# Patient Record
Sex: Male | Born: 1940 | Race: White | Hispanic: No | Marital: Married | Smoking: Former smoker
Health system: Southern US, Community
[De-identification: ages and names within clinical notes are randomized; demographics above are authoritative.]

## PROBLEM LIST (undated history)

## (undated) DIAGNOSIS — G20 Parkinson's disease: Secondary | ICD-10-CM

## (undated) DIAGNOSIS — F419 Anxiety disorder, unspecified: Secondary | ICD-10-CM

## (undated) DIAGNOSIS — I350 Nonrheumatic aortic (valve) stenosis: Secondary | ICD-10-CM

## (undated) DIAGNOSIS — F32A Depression, unspecified: Secondary | ICD-10-CM

## (undated) DIAGNOSIS — E1143 Type 2 diabetes mellitus with diabetic autonomic (poly)neuropathy: Secondary | ICD-10-CM

## (undated) DIAGNOSIS — K3184 Gastroparesis: Secondary | ICD-10-CM

## (undated) DIAGNOSIS — G20A1 Parkinson's disease without dyskinesia, without mention of fluctuations: Secondary | ICD-10-CM

## (undated) DIAGNOSIS — E785 Hyperlipidemia, unspecified: Secondary | ICD-10-CM

## (undated) DIAGNOSIS — I509 Heart failure, unspecified: Secondary | ICD-10-CM

## (undated) DIAGNOSIS — I739 Peripheral vascular disease, unspecified: Secondary | ICD-10-CM

## (undated) DIAGNOSIS — E119 Type 2 diabetes mellitus without complications: Secondary | ICD-10-CM

## (undated) DIAGNOSIS — M51369 Other intervertebral disc degeneration, lumbar region without mention of lumbar back pain or lower extremity pain: Secondary | ICD-10-CM

## (undated) DIAGNOSIS — M5136 Other intervertebral disc degeneration, lumbar region: Secondary | ICD-10-CM

## (undated) DIAGNOSIS — R9389 Abnormal findings on diagnostic imaging of other specified body structures: Secondary | ICD-10-CM

## (undated) DIAGNOSIS — J449 Chronic obstructive pulmonary disease, unspecified: Secondary | ICD-10-CM

## (undated) DIAGNOSIS — E669 Obesity, unspecified: Secondary | ICD-10-CM

## (undated) DIAGNOSIS — N529 Male erectile dysfunction, unspecified: Secondary | ICD-10-CM

## (undated) DIAGNOSIS — J90 Pleural effusion, not elsewhere classified: Secondary | ICD-10-CM

## (undated) DIAGNOSIS — C61 Malignant neoplasm of prostate: Secondary | ICD-10-CM

## (undated) DIAGNOSIS — I459 Conduction disorder, unspecified: Secondary | ICD-10-CM

## (undated) DIAGNOSIS — F329 Major depressive disorder, single episode, unspecified: Secondary | ICD-10-CM

## (undated) DIAGNOSIS — T4145XA Adverse effect of unspecified anesthetic, initial encounter: Secondary | ICD-10-CM

## (undated) DIAGNOSIS — N189 Chronic kidney disease, unspecified: Secondary | ICD-10-CM

## (undated) DIAGNOSIS — Z9981 Dependence on supplemental oxygen: Secondary | ICD-10-CM

## (undated) DIAGNOSIS — I5032 Chronic diastolic (congestive) heart failure: Secondary | ICD-10-CM

## (undated) DIAGNOSIS — C4490 Unspecified malignant neoplasm of skin, unspecified: Secondary | ICD-10-CM

## (undated) DIAGNOSIS — T8859XA Other complications of anesthesia, initial encounter: Secondary | ICD-10-CM

## (undated) DIAGNOSIS — D509 Iron deficiency anemia, unspecified: Secondary | ICD-10-CM

## (undated) DIAGNOSIS — I4891 Unspecified atrial fibrillation: Secondary | ICD-10-CM

## (undated) DIAGNOSIS — Z940 Kidney transplant status: Secondary | ICD-10-CM

## (undated) DIAGNOSIS — I2510 Atherosclerotic heart disease of native coronary artery without angina pectoris: Secondary | ICD-10-CM

## (undated) DIAGNOSIS — I10 Essential (primary) hypertension: Secondary | ICD-10-CM

## (undated) DIAGNOSIS — I951 Orthostatic hypotension: Secondary | ICD-10-CM

## (undated) DIAGNOSIS — R0602 Shortness of breath: Secondary | ICD-10-CM

## (undated) DIAGNOSIS — G259 Extrapyramidal and movement disorder, unspecified: Secondary | ICD-10-CM

## (undated) DIAGNOSIS — Z9289 Personal history of other medical treatment: Secondary | ICD-10-CM

## (undated) HISTORY — PX: INGUINAL HERNIA REPAIR: SUR1180

## (undated) HISTORY — DX: Chronic kidney disease, unspecified: N18.9

## (undated) HISTORY — DX: Malignant neoplasm of prostate: C61

## (undated) HISTORY — DX: Orthostatic hypotension: I95.1

## (undated) HISTORY — DX: Male erectile dysfunction, unspecified: N52.9

## (undated) HISTORY — DX: Hyperlipidemia, unspecified: E78.5

## (undated) HISTORY — DX: Essential (primary) hypertension: I10

## (undated) HISTORY — PX: MOHS SURGERY: SUR867

## (undated) HISTORY — DX: Type 2 diabetes mellitus with diabetic autonomic (poly)neuropathy: E11.43

## (undated) HISTORY — PX: VITRECTOMY: SHX106

## (undated) HISTORY — DX: Unspecified atrial fibrillation: I48.91

## (undated) HISTORY — DX: Atherosclerotic heart disease of native coronary artery without angina pectoris: I25.10

## (undated) HISTORY — DX: Other intervertebral disc degeneration, lumbar region without mention of lumbar back pain or lower extremity pain: M51.369

## (undated) HISTORY — DX: Peripheral vascular disease, unspecified: I73.9

## (undated) HISTORY — PX: APPENDECTOMY: SHX54

## (undated) HISTORY — DX: Other intervertebral disc degeneration, lumbar region: M51.36

## (undated) HISTORY — DX: Pleural effusion, not elsewhere classified: J90

## (undated) HISTORY — DX: Obesity, unspecified: E66.9

## (undated) HISTORY — DX: Kidney transplant status: Z94.0

## (undated) HISTORY — DX: Chronic diastolic (congestive) heart failure: I50.32

## (undated) HISTORY — DX: Chronic obstructive pulmonary disease, unspecified: J44.9

## (undated) HISTORY — DX: Type 2 diabetes mellitus without complications: E11.9

## (undated) HISTORY — DX: Gastroparesis: K31.84

## (undated) HISTORY — PX: TONSILLECTOMY: SUR1361

## (undated) HISTORY — PX: CATARACT EXTRACTION W/ INTRAOCULAR LENS  IMPLANT, BILATERAL: SHX1307

## (undated) HISTORY — DX: Extrapyramidal and movement disorder, unspecified: G25.9

---

## 1997-07-01 ENCOUNTER — Ambulatory Visit (HOSPITAL_COMMUNITY): Admission: RE | Admit: 1997-07-01 | Discharge: 1997-07-01 | Payer: Self-pay | Admitting: Internal Medicine

## 1997-10-15 ENCOUNTER — Encounter: Admission: RE | Admit: 1997-10-15 | Discharge: 1998-01-13 | Payer: Self-pay | Admitting: Internal Medicine

## 2000-03-14 ENCOUNTER — Encounter: Admission: RE | Admit: 2000-03-14 | Discharge: 2000-03-14 | Payer: Self-pay | Admitting: Nephrology

## 2000-03-14 ENCOUNTER — Encounter: Payer: Self-pay | Admitting: Nephrology

## 2000-03-23 HISTORY — PX: PERITONEAL CATHETER INSERTION: SHX2223

## 2000-03-29 ENCOUNTER — Ambulatory Visit (HOSPITAL_COMMUNITY): Admission: RE | Admit: 2000-03-29 | Discharge: 2000-03-30 | Payer: Self-pay | Admitting: General Surgery

## 2000-03-29 ENCOUNTER — Encounter: Payer: Self-pay | Admitting: General Surgery

## 2000-06-23 ENCOUNTER — Inpatient Hospital Stay (HOSPITAL_COMMUNITY): Admission: EM | Admit: 2000-06-23 | Discharge: 2000-06-27 | Payer: Self-pay | Admitting: Emergency Medicine

## 2000-06-23 ENCOUNTER — Encounter: Payer: Self-pay | Admitting: Nephrology

## 2000-08-21 HISTORY — PX: PERITONEAL CATHETER REMOVAL: SUR1106

## 2000-08-21 HISTORY — PX: KIDNEY TRANSPLANT: SHX239

## 2000-10-02 ENCOUNTER — Encounter: Admission: RE | Admit: 2000-10-02 | Discharge: 2000-11-21 | Payer: Self-pay | Admitting: *Deleted

## 2001-02-26 ENCOUNTER — Encounter (HOSPITAL_COMMUNITY): Admission: RE | Admit: 2001-02-26 | Discharge: 2001-05-27 | Payer: Self-pay | Admitting: Nephrology

## 2001-06-14 ENCOUNTER — Encounter (HOSPITAL_COMMUNITY): Admission: RE | Admit: 2001-06-14 | Discharge: 2001-09-12 | Payer: Self-pay | Admitting: Nephrology

## 2001-10-05 ENCOUNTER — Encounter (HOSPITAL_COMMUNITY): Admission: RE | Admit: 2001-10-05 | Discharge: 2001-10-05 | Payer: Self-pay | Admitting: Nephrology

## 2001-11-10 ENCOUNTER — Inpatient Hospital Stay (HOSPITAL_COMMUNITY): Admission: RE | Admit: 2001-11-10 | Discharge: 2001-11-11 | Payer: Self-pay | Admitting: Vascular Surgery

## 2001-11-10 ENCOUNTER — Encounter: Payer: Self-pay | Admitting: Vascular Surgery

## 2003-05-27 ENCOUNTER — Encounter (INDEPENDENT_AMBULATORY_CARE_PROVIDER_SITE_OTHER): Payer: Self-pay | Admitting: Specialist

## 2003-05-27 ENCOUNTER — Ambulatory Visit (HOSPITAL_COMMUNITY): Admission: RE | Admit: 2003-05-27 | Discharge: 2003-05-27 | Payer: Self-pay | Admitting: *Deleted

## 2004-02-21 HISTORY — PX: RADIOACTIVE SEED IMPLANT: SHX5150

## 2007-03-20 ENCOUNTER — Encounter: Admission: RE | Admit: 2007-03-20 | Discharge: 2007-05-15 | Payer: Self-pay | Admitting: Neurology

## 2007-09-29 ENCOUNTER — Encounter
Admission: RE | Admit: 2007-09-29 | Discharge: 2007-09-29 | Payer: Self-pay | Admitting: Physical Medicine and Rehabilitation

## 2007-10-29 ENCOUNTER — Encounter
Admission: RE | Admit: 2007-10-29 | Discharge: 2007-10-29 | Payer: Self-pay | Admitting: Physical Medicine and Rehabilitation

## 2008-03-25 ENCOUNTER — Encounter (HOSPITAL_COMMUNITY): Admission: RE | Admit: 2008-03-25 | Discharge: 2008-06-23 | Payer: Self-pay | Admitting: Nephrology

## 2008-07-08 ENCOUNTER — Observation Stay (HOSPITAL_COMMUNITY): Admission: EM | Admit: 2008-07-08 | Discharge: 2008-07-08 | Payer: Self-pay | Admitting: Emergency Medicine

## 2009-03-19 ENCOUNTER — Ambulatory Visit: Payer: Self-pay | Admitting: Internal Medicine

## 2009-03-19 ENCOUNTER — Ambulatory Visit: Payer: Self-pay | Admitting: Family Medicine

## 2009-03-19 ENCOUNTER — Inpatient Hospital Stay (HOSPITAL_COMMUNITY): Admission: EM | Admit: 2009-03-19 | Discharge: 2009-05-04 | Payer: Self-pay | Admitting: Emergency Medicine

## 2009-03-19 ENCOUNTER — Ambulatory Visit: Payer: Self-pay | Admitting: Vascular Surgery

## 2009-03-19 ENCOUNTER — Encounter: Payer: Self-pay | Admitting: Family Medicine

## 2009-03-19 ENCOUNTER — Ambulatory Visit: Payer: Self-pay | Admitting: Surgery

## 2009-03-20 ENCOUNTER — Encounter: Payer: Self-pay | Admitting: Family Medicine

## 2009-03-23 ENCOUNTER — Ambulatory Visit: Payer: Self-pay | Admitting: Pulmonary Disease

## 2009-03-23 HISTORY — PX: CORONARY ARTERY BYPASS GRAFT: SHX141

## 2009-03-23 HISTORY — PX: CARDIAC CATHETERIZATION: SHX172

## 2009-03-24 ENCOUNTER — Encounter: Payer: Self-pay | Admitting: Internal Medicine

## 2009-03-26 ENCOUNTER — Encounter: Payer: Self-pay | Admitting: Cardiology

## 2009-03-26 ENCOUNTER — Ambulatory Visit: Payer: Self-pay | Admitting: Surgery

## 2009-03-27 ENCOUNTER — Encounter: Payer: Self-pay | Admitting: Surgery

## 2009-04-07 ENCOUNTER — Ambulatory Visit: Payer: Self-pay | Admitting: Physical Medicine & Rehabilitation

## 2009-04-10 ENCOUNTER — Encounter: Payer: Self-pay | Admitting: Surgery

## 2009-04-11 ENCOUNTER — Encounter: Payer: Self-pay | Admitting: Cardiothoracic Surgery

## 2009-04-14 ENCOUNTER — Encounter (INDEPENDENT_AMBULATORY_CARE_PROVIDER_SITE_OTHER): Payer: Self-pay | Admitting: Nephrology

## 2009-04-14 ENCOUNTER — Ambulatory Visit: Payer: Self-pay | Admitting: Vascular Surgery

## 2009-04-22 ENCOUNTER — Ambulatory Visit: Payer: Self-pay | Admitting: Critical Care Medicine

## 2009-04-27 ENCOUNTER — Ambulatory Visit: Payer: Self-pay | Admitting: Surgery

## 2009-05-04 ENCOUNTER — Inpatient Hospital Stay (HOSPITAL_COMMUNITY)
Admission: RE | Admit: 2009-05-04 | Discharge: 2009-05-11 | Payer: Self-pay | Admitting: Physical Medicine & Rehabilitation

## 2009-05-05 ENCOUNTER — Ambulatory Visit: Payer: Self-pay | Admitting: Physical Medicine & Rehabilitation

## 2009-05-07 ENCOUNTER — Encounter: Payer: Self-pay | Admitting: Cardiology

## 2009-05-07 ENCOUNTER — Ambulatory Visit: Payer: Self-pay | Admitting: Physical Medicine & Rehabilitation

## 2009-05-11 ENCOUNTER — Encounter: Payer: Self-pay | Admitting: Critical Care Medicine

## 2009-05-12 ENCOUNTER — Encounter: Payer: Self-pay | Admitting: Cardiology

## 2009-05-17 ENCOUNTER — Encounter: Admission: RE | Admit: 2009-05-17 | Discharge: 2009-05-17 | Payer: Self-pay | Admitting: Surgery

## 2009-05-17 ENCOUNTER — Ambulatory Visit: Payer: Self-pay | Admitting: Surgery

## 2009-05-19 ENCOUNTER — Telehealth: Payer: Self-pay | Admitting: Cardiology

## 2009-05-20 ENCOUNTER — Encounter: Payer: Self-pay | Admitting: Cardiology

## 2009-05-20 ENCOUNTER — Telehealth: Payer: Self-pay | Admitting: Cardiology

## 2009-05-20 DIAGNOSIS — I2510 Atherosclerotic heart disease of native coronary artery without angina pectoris: Secondary | ICD-10-CM | POA: Insufficient documentation

## 2009-05-20 DIAGNOSIS — I319 Disease of pericardium, unspecified: Secondary | ICD-10-CM | POA: Insufficient documentation

## 2009-05-21 ENCOUNTER — Ambulatory Visit: Payer: Self-pay

## 2009-05-21 ENCOUNTER — Ambulatory Visit (HOSPITAL_COMMUNITY): Admission: RE | Admit: 2009-05-21 | Discharge: 2009-05-21 | Payer: Self-pay | Admitting: Cardiology

## 2009-05-21 ENCOUNTER — Ambulatory Visit: Payer: Self-pay | Admitting: Cardiovascular Disease

## 2009-05-21 ENCOUNTER — Encounter: Payer: Self-pay | Admitting: Cardiology

## 2009-05-24 ENCOUNTER — Telehealth: Payer: Self-pay | Admitting: Cardiology

## 2009-05-25 ENCOUNTER — Telehealth: Payer: Self-pay | Admitting: Cardiology

## 2009-05-26 ENCOUNTER — Ambulatory Visit: Payer: Self-pay | Admitting: Cardiology

## 2009-05-26 DIAGNOSIS — I951 Orthostatic hypotension: Secondary | ICD-10-CM

## 2009-05-26 DIAGNOSIS — I5032 Chronic diastolic (congestive) heart failure: Secondary | ICD-10-CM

## 2009-05-26 DIAGNOSIS — I4891 Unspecified atrial fibrillation: Secondary | ICD-10-CM

## 2009-06-01 ENCOUNTER — Encounter: Payer: Self-pay | Admitting: Cardiology

## 2009-06-02 ENCOUNTER — Telehealth: Payer: Self-pay | Admitting: Cardiology

## 2009-06-07 ENCOUNTER — Encounter: Payer: Self-pay | Admitting: Cardiology

## 2009-06-18 ENCOUNTER — Encounter: Payer: Self-pay | Admitting: Cardiology

## 2009-06-24 ENCOUNTER — Encounter (HOSPITAL_COMMUNITY): Admission: RE | Admit: 2009-06-24 | Discharge: 2009-09-22 | Payer: Self-pay | Admitting: Cardiology

## 2009-06-24 ENCOUNTER — Ambulatory Visit: Payer: Self-pay | Admitting: Cardiology

## 2009-06-24 DIAGNOSIS — E785 Hyperlipidemia, unspecified: Secondary | ICD-10-CM

## 2009-06-28 ENCOUNTER — Encounter: Payer: Self-pay | Admitting: Cardiology

## 2009-06-30 ENCOUNTER — Encounter: Payer: Self-pay | Admitting: Cardiology

## 2009-07-06 ENCOUNTER — Encounter: Payer: Self-pay | Admitting: Cardiology

## 2009-07-09 ENCOUNTER — Telehealth: Payer: Self-pay | Admitting: Cardiology

## 2009-07-14 ENCOUNTER — Encounter: Payer: Self-pay | Admitting: Cardiology

## 2009-07-29 ENCOUNTER — Encounter: Payer: Self-pay | Admitting: Cardiology

## 2009-08-03 ENCOUNTER — Telehealth: Payer: Self-pay | Admitting: Cardiology

## 2009-08-27 ENCOUNTER — Encounter: Payer: Self-pay | Admitting: Cardiology

## 2009-09-10 ENCOUNTER — Encounter: Payer: Self-pay | Admitting: Cardiology

## 2009-09-20 ENCOUNTER — Encounter: Payer: Self-pay | Admitting: Cardiology

## 2009-09-23 ENCOUNTER — Encounter (HOSPITAL_COMMUNITY): Admission: RE | Admit: 2009-09-23 | Discharge: 2009-10-21 | Payer: Self-pay | Admitting: Cardiology

## 2009-10-18 ENCOUNTER — Encounter: Payer: Self-pay | Admitting: Cardiology

## 2009-10-19 ENCOUNTER — Encounter: Payer: Self-pay | Admitting: Cardiology

## 2009-10-27 ENCOUNTER — Encounter (HOSPITAL_COMMUNITY): Admission: RE | Admit: 2009-10-27 | Discharge: 2010-01-25 | Payer: Self-pay | Admitting: Cardiology

## 2009-10-28 ENCOUNTER — Encounter: Payer: Self-pay | Admitting: Cardiology

## 2009-10-29 ENCOUNTER — Encounter: Payer: Self-pay | Admitting: Cardiology

## 2009-11-01 ENCOUNTER — Ambulatory Visit: Payer: Self-pay | Admitting: Cardiology

## 2009-11-01 DIAGNOSIS — I7389 Other specified peripheral vascular diseases: Secondary | ICD-10-CM | POA: Insufficient documentation

## 2009-11-05 ENCOUNTER — Encounter: Payer: Self-pay | Admitting: Cardiology

## 2009-11-12 ENCOUNTER — Encounter: Payer: Self-pay | Admitting: Cardiology

## 2009-11-16 ENCOUNTER — Ambulatory Visit: Payer: Self-pay

## 2009-11-16 ENCOUNTER — Ambulatory Visit: Payer: Self-pay | Admitting: Cardiology

## 2009-11-16 DIAGNOSIS — E7800 Pure hypercholesterolemia, unspecified: Secondary | ICD-10-CM | POA: Insufficient documentation

## 2009-11-16 DIAGNOSIS — I25810 Atherosclerosis of coronary artery bypass graft(s) without angina pectoris: Secondary | ICD-10-CM | POA: Insufficient documentation

## 2009-11-30 ENCOUNTER — Ambulatory Visit: Payer: Self-pay | Admitting: Cardiovascular Disease

## 2009-12-07 ENCOUNTER — Encounter: Payer: Self-pay | Admitting: Cardiovascular Disease

## 2009-12-27 ENCOUNTER — Ambulatory Visit: Payer: Self-pay | Admitting: Cardiovascular Disease

## 2009-12-27 DIAGNOSIS — I739 Peripheral vascular disease, unspecified: Secondary | ICD-10-CM

## 2009-12-28 LAB — CONVERTED CEMR LAB
Basophils Absolute: 0.1 10*3/uL (ref 0.0–0.1)
CO2: 26 meq/L (ref 19–32)
Calcium: 9.5 mg/dL (ref 8.4–10.5)
Chloride: 105 meq/L (ref 96–112)
Creatinine, Ser: 1.3 mg/dL (ref 0.4–1.5)
HCT: 40 % (ref 39.0–52.0)
Hemoglobin: 13.1 g/dL (ref 13.0–17.0)
INR: 1 (ref 0.8–1.0)
Lymphocytes Relative: 15.4 % (ref 12.0–46.0)
MCV: 75.4 fL — ABNORMAL LOW (ref 78.0–100.0)
Monocytes Relative: 2.9 % — ABNORMAL LOW (ref 3.0–12.0)
Platelets: 184 10*3/uL (ref 150.0–400.0)
Prothrombin Time: 10.9 s (ref 9.7–11.8)
RBC: 5.3 M/uL (ref 4.22–5.81)
RDW: 18.3 % — ABNORMAL HIGH (ref 11.5–14.6)
Sodium: 139 meq/L (ref 135–145)
WBC: 10.8 10*3/uL — ABNORMAL HIGH (ref 4.5–10.5)

## 2009-12-29 ENCOUNTER — Ambulatory Visit: Payer: Self-pay | Admitting: Cardiovascular Disease

## 2009-12-29 ENCOUNTER — Ambulatory Visit (HOSPITAL_COMMUNITY): Admission: RE | Admit: 2009-12-29 | Discharge: 2009-12-29 | Payer: Self-pay | Admitting: Cardiovascular Disease

## 2009-12-30 ENCOUNTER — Encounter: Payer: Self-pay | Admitting: Cardiovascular Disease

## 2010-01-04 ENCOUNTER — Encounter: Payer: Self-pay | Admitting: Cardiovascular Disease

## 2010-01-05 ENCOUNTER — Ambulatory Visit: Payer: Self-pay | Admitting: Cardiology

## 2010-01-06 ENCOUNTER — Ambulatory Visit: Payer: Self-pay | Admitting: Critical Care Medicine

## 2010-01-06 ENCOUNTER — Ambulatory Visit: Payer: Self-pay | Admitting: Diagnostic Radiology

## 2010-01-06 ENCOUNTER — Ambulatory Visit (HOSPITAL_BASED_OUTPATIENT_CLINIC_OR_DEPARTMENT_OTHER): Admission: RE | Admit: 2010-01-06 | Discharge: 2010-01-06 | Payer: Self-pay | Admitting: Critical Care Medicine

## 2010-01-06 DIAGNOSIS — J90 Pleural effusion, not elsewhere classified: Secondary | ICD-10-CM | POA: Insufficient documentation

## 2010-01-06 LAB — CONVERTED CEMR LAB
CO2: 27 meq/L (ref 19–32)
Calcium: 9.4 mg/dL (ref 8.4–10.5)
Creatinine, Ser: 1.4 mg/dL (ref 0.4–1.5)
GFR calc non Af Amer: 54.61 mL/min (ref >60)
Glucose, Bld: 89 mg/dL (ref 70–99)
Hemoglobin: 13.3 g/dL (ref 13.0–17.0)
INR: 1 (ref 0.8–1.0)
MCV: 75.4 fL — ABNORMAL LOW (ref 78.0–100.0)
Monocytes Absolute: 0.8 10*3/uL (ref 0.1–1.0)
Neutrophils Relative %: 69.8 % (ref 43.0–77.0)
Platelets: 191 10*3/uL (ref 150.0–400.0)
Prothrombin Time: 10.6 s (ref 9.7–11.8)
RBC: 5.45 M/uL (ref 4.22–5.81)

## 2010-01-12 ENCOUNTER — Inpatient Hospital Stay (HOSPITAL_COMMUNITY): Admission: RE | Admit: 2010-01-12 | Discharge: 2010-01-15 | Payer: Self-pay | Admitting: Cardiovascular Disease

## 2010-01-12 ENCOUNTER — Ambulatory Visit: Payer: Self-pay | Admitting: Cardiovascular Disease

## 2010-01-13 ENCOUNTER — Encounter: Payer: Self-pay | Admitting: Cardiovascular Disease

## 2010-01-14 ENCOUNTER — Ambulatory Visit: Payer: Self-pay | Admitting: Surgery

## 2010-01-17 ENCOUNTER — Encounter: Payer: Self-pay | Admitting: Cardiology

## 2010-01-18 ENCOUNTER — Telehealth: Payer: Self-pay | Admitting: Cardiovascular Disease

## 2010-01-20 ENCOUNTER — Encounter: Payer: Self-pay | Admitting: Critical Care Medicine

## 2010-01-25 ENCOUNTER — Ambulatory Visit: Payer: Self-pay

## 2010-01-25 ENCOUNTER — Encounter: Payer: Self-pay | Admitting: Cardiovascular Disease

## 2010-01-25 ENCOUNTER — Ambulatory Visit: Payer: Self-pay | Admitting: Cardiovascular Disease

## 2010-01-25 DIAGNOSIS — M79609 Pain in unspecified limb: Secondary | ICD-10-CM

## 2010-01-26 ENCOUNTER — Encounter: Payer: Self-pay | Admitting: Cardiology

## 2010-01-27 ENCOUNTER — Encounter: Payer: Self-pay | Admitting: Cardiovascular Disease

## 2010-02-01 ENCOUNTER — Encounter: Payer: Self-pay | Admitting: Critical Care Medicine

## 2010-02-18 ENCOUNTER — Encounter: Payer: Self-pay | Admitting: Cardiology

## 2010-02-24 ENCOUNTER — Telehealth: Payer: Self-pay | Admitting: Cardiology

## 2010-02-24 ENCOUNTER — Telehealth (INDEPENDENT_AMBULATORY_CARE_PROVIDER_SITE_OTHER): Payer: Self-pay | Admitting: *Deleted

## 2010-02-24 ENCOUNTER — Encounter: Payer: Self-pay | Admitting: Cardiovascular Disease

## 2010-03-02 ENCOUNTER — Ambulatory Visit: Admission: RE | Admit: 2010-03-02 | Discharge: 2010-03-02 | Payer: Self-pay | Source: Home / Self Care

## 2010-03-13 ENCOUNTER — Encounter: Payer: Self-pay | Admitting: Physical Medicine & Rehabilitation

## 2010-03-20 LAB — CONVERTED CEMR LAB
BUN: 32 mg/dL — ABNORMAL HIGH (ref 6–23)
GFR calc non Af Amer: 68.93 mL/min (ref >60)
Glucose, Bld: 169 mg/dL — ABNORMAL HIGH (ref 70–99)
Sodium: 140 meq/L (ref 135–145)

## 2010-03-24 NOTE — Assessment & Plan Note (Signed)
Summary: PER CHECK OUT/SF   Primary Provider:  Dr. [name]   History of Present Illness: 70 yo with history of renal transplant, DM, HTN, and CAD s/p CABG returns for followup.  Patient had a recent long and complicated hospital admission.  Patient was admitted to [facility] on 03/19/09 with a UTI and pulmonary edema.  Troponin was noted to be elevated and the patient had a nuclear scan with a large fixed inferior defect.  He ended up having a cardiac catheterization showing severe disease involving the LM, LAD, ramus, CFX and RCA.  He had CABG x 5.  Post-op course was complicated by pulmonary edema requiring intubation, renal failure requiring CVVH, and atrial fibrillation.  He was in the hospital about 7 weeks.  He is now at home post-discharge.    Lately, Mr. Rudd has been doing quite well.  His orthostatic symptoms seem to have mostly resolved.  No recent falls or syncope/presyncope.  BP is actually high today (156/82).  SBP at home has been running mainly in the 110s-120s systolic.  He is in the maintenance phase at cardiac rehab and has been very diligent with exercise.  He has lost another 10 lbs since last appointment.  He rides an exercise bicycle for up to an hour at a time and is able to walk on flat ground without significant dyspnea.  He gets some hip and back pain from osteoarthritis.  Main complaint, however, is right calf pain consistent with claudication if he walks up a steep incline or steps.  Several years ago, this symptom was actually worse.    Labs (3/11): HCT 35.1, LDL 79, HDL 63 Labs (4/11): K 5.1, creatinine 1.78 Labs (9/11): creatinine 1.0  ECG: NSR with 1st degree AV block (280 msec), LAFB.   Current Medications (verified): 1)  Furosemide 40 Mg Tabs (Furosemide) .... Take 2 in The Morning and 1 Tablet in The Afternoon 2)  Acetaminophen 325 Mg Tabs (Acetaminophen) .... Take 2 Tablets As Needed For Pain 3)  Aspirin 325 Mg Tabs (Aspirin) .... Once Daily 4)  Oxycodone Hcl  5 Mg Tabs (Oxycodone Hcl) .... Take One Tablet Every 6 Hrs As Needed Pain 5)  Calcium Carbonate 1250 Mg Tabs (Calcium Carbonate) .... One Tablet Daily 6)  Cellcept 500 Mg Tabs (Mycophenolate Mofetil) .... Take One Tablet Two Times A Day 7)  Crestor 10 Mg Tabs (Rosuvastatin Calcium) .... Take One Tablet Once Daily 8)  Cymbalta 60 Mg Cpep (Duloxetine Hcl) .... Take One Once Daily 9)  Prednisone 5 Mg Tabs (Prednisone) .... Take One Tablet Once Daily 10)  Ferrous Sulfate 325 (65 Fe) Mg Tabs (Ferrous Sulfate) .... Take One Tablet Once Daily 11)  Folic Acid 800 Mcg Tabs (Folic Acid) .... Take One Tablet Once Daily 12)  Flomax 0.4 Mg Caps (Tamsulosin Hcl) .... Take One Capsule Once Daily 13)  Januvia 100 Mg Tabs (Sitagliptin Phosphate) .... Take One Tablet Once Daily 14)  Magnesium Oxide 400 Mg Tabs (Magnesium Oxide) .... Two Times A Day 15)  Multivitamins  Tabs (Multiple Vitamin) .... Once Daily 16)  Prilosec 20 Mg Cpdr (Omeprazole) .... Once Daily 17)  Prograf 5 Mg Caps (Tacrolimus) .... Take One Tablet By Mouth Two Times A Day 18)  Restoril 15 Mg Caps (Temazepam) .... Take One Capsule Daily At Bedtime 19)  Vitamin B-12 100 Mcg Tabs (Cyanocobalamin) .... Take One Tablet By Mouth Once Daily 20)  Vitamin D3 10000 Unit Caps (Cholecalciferol) .... Take One Capsule Twice A Week 21)  Zetia 10 Mg Tabs (Ezetimibe) .... Take One Tablet Once Daily 22)  Novolog 100 Unit/ml Soln (Insulin Aspart) .... Sliding Scale With Insulin Pump. 23)  Carbidopa-Levodopa 10-100 Mg Tabs (Carbidopa-Levodopa) .... Take One Tablet Three Times A Day 24)  Pyridostigmine Bromide 60 Mg Tabs (Pyridostigmine Bromide) .... One  Tablet Two Times A Day  Allergies (verified): No Known Drug Allergies  Past History:  Past Medical History: Reviewed history from 06/24/2009 and no changes required. 1. Diabetes mellitus 2. CKD s/p renal transplant 2002 3. HTN 4. PAD: Followed by Dr. Edilia Bo 5. Lumbar disc disease with low back  pain 6. Diabetic gastroparesis 7. Prostate cancer s/p seed implantation in 1/06.  8. Erectile dysfunction 9. Tremor 10.  Obesity 11.  Diastolic CHF: Last echo (4/11) with inferior and septal hypokinesis, moderate LV hypertrophy, EF 45-50%, mild left atrial enlargement.  12.  CAD: Cath (2/11) with 60% distal left main, 90% ostial LAD involving D1, 95% ostial large ramus, probable significant ostial CFX stenosis, 90% PDA, 70% PLV.  Paitent had CABG with LIMA-LAD, SVG-D, sequential SVG-ramus and OM2, SVG-PLV.   13.  Atrial fibrillation post-op CABG 14.  Orthostatic hypotension: Some improvement with pyridostigmine.   Family History: Reviewed history from 05/26/2009 and no changes required. Noncontributory  Social History: Reviewed history from 05/26/2009 and no changes required. Married, retired Clinical research associate, lives in [place].  Nonsmoker.   Review of Systems       All systems reviewed and negative except as per HPI.   Vital Signs:  Patient profile:   70 year old male Height:      73 inches Weight:      220 pounds BMI:     29.13 Pulse rate:   74 / minute Pulse rhythm:   regular BP sitting:   158 / 82  (left arm) Cuff size:   regular  Vitals Entered By: Judithe Modest CMA (November 01, 2009 11:40 AM)  Physical Exam  General:  Well developed, well nourished, in no acute distress. Neck:  Neck supple, no JVD. No masses, thyromegaly or abnormal cervical nodes. Lungs:  Clear bilaterally to auscultation and percussion. Heart:  Non-displaced PMI, chest non-tender; regular rate and rhythm, S1, S2 without rubs or gallops. 2/6 systolic murmur along the left sternal border.  Carotid upstroke normal, no bruit. No edema.  Abdomen:  Bowel sounds positive; abdomen soft and non-tender without masses, organomegaly, or hernias noted. No hepatosplenomegaly. Extremities:  No clubbing or cyanosis. Neurologic:  Alert and oriented x 3. Psych:  Normal affect.   Impression &  Recommendations:  Problem # 1:  CORONARY ATHEROSCLEROSIS, ARTERY BYPASS GRAFT (ICD-414.04) Stable with no chest pain.  Continue ASA and Crestor.  I will hold off on beta blocker for now with long 1st degree AV block.  I will start him on a low dose of lisinopril at 2.5 mg daily for secondary prevention in the setting of CAD with mildly decreased systolic function.  Will check BMET in 2 wks.  Last creatinine was 1.0.   Problem # 2:  PVD WITH CLAUDICATION (ICD-443.89) Patient is noting claudication in his right calf more now that he is more active.  He has had some claudication for a number of years and saw Dr. Edilia Bo in the past.  I will get full arterial dopplers to assess PAD.  I encouraged him to try to walk through the pain as much as possible to encourage collateral formation.    Problem # 3:  CHRONIC DIASTOLIC HEART FAILURE (ICD-428.32) Last EF  45-50%.  As above, I will begin a low dose of lisinopril.  He appears euvolemic today.  I am going to start cutting back on Lasix.  I will decrease his Lasix dose to 40 mg two times a day.  I instructed him to call the office if he becomes short of breath or gains more than 3 bls in a week.   Problem # 4:  ATRIAL FIBRILLATION (ICD-427.31) Post-op atrial fib. No evidence for any further episodes.  He will continue ASA 325 mg daily.  If he has a recurrence of atrial fibrillation, we will need to consider coumadin (probably not Pradaxa candidate given CKD).   Problem # 5:  HYPERLIPIDEMIA-MIXED (ICD-272.4) Patient states that lipids were recently drawn by Dr. [name].  I will try to get a copy from his office.  Goal LDL < 70.   Other Orders: Arterial Duplex Lower Extremity (Arterial Duplex Low)  Patient Instructions: 1)  Your physician has recommended you make the following change in your medication:  2)  Decrease Lasix(furosemide) to 40mg  twice a day.  If you gain more than 2-3 pounds in a week you should let Dr Shirlee Latch know. He will want to increase your  Lasix back to 80mg  in the morning and 40mg  in the evening. 3)  Start Lisinopril 2.5mg  daily. 4)  Your physician recommends that you return for lab work in: 2 weeks--BMP 414.05 272.0 5)  Your physician has requested that you have a lower or extremity arterial duplex.  This test is an ultrasound of the arteries in the legs. It looks at arterial blood flow in the legs and arms.  Allow one hour for Lower  Arterial scans. There are no restrictions or special instructions. 6)  Your physician recommends that you schedule a follow-up appointment in: 4 months with Dr Shirlee Latch. 7)  Your physician recommends that you weigh, daily, at the same time every day, and in the same amount of clothing.   Prescriptions: LISINOPRIL 2.5 MG TABS (LISINOPRIL) one daily  #30 x 11   Entered by:   Katina Dung, RN, BSN   Authorized by:   [name], MD   Signed by:   Katina Dung, RN, BSN on 11/01/2009   Method used:   Electronically to        [facility]* (retail)       [address]       [place], [place]  [address]       Ph: [phone] or [phone]       Fax: [phone]   RxID:   [id number]

## 2010-03-24 NOTE — Assessment & Plan Note (Signed)
Summary: eph/wpa   Visit Type:  EPH Primary Provider:  Dr. [name]  CC:  pain in the groin at insertion site.....c/o back pain....denies any other complaints today...wants to know if he is supposed to still hold his Lisinopril.  History of Present Illness: 70 yo male with history of PAD, CAD s/p CABG, renal transplant, HTN, DM and post-op atrial fibrillation who is here today for PV follow up. His cardiac issues are followed by Dr. Shirlee Latch. He has had had problems with bilateral  lower extremity pain for over ten years and has been seen in the past by Dr. Edilia Bo with [facility]. I saw him in October and planned a lower ext angiogram. He was found to have severe disease in the distal right SFA and right popliteal. I staged his intervention and brought him back in for atherectomy of the right SFA and popliteal on 01/12/10. The intervention went well but he unfortunately had a pseudoaneurysm form at the left groin cath site. Dr. Myra Gianotti helped me inject the pseudoaneurysm with thrombin on 01/14/10. He was discharged home on 01/15/10. He is here today for follow up. His right leg feels great. No claudicaiton. His left leg is feeling better at the cath site although he still has a large hard hematoma. U/S of the left groin today with closed pseudoaneurysm, residual large hematoma. He has no complaints today.      Current Medications (verified): 1)  Furosemide 40 Mg Tabs (Furosemide) .... Take 1 in The Morning and 1 Tablet in The Afternoon 2)  Acetaminophen 325 Mg Tabs (Acetaminophen) .... Take 2 Tablets As Needed For Pain 3)  Aspirin 325 Mg Tabs (Aspirin) .... Once Daily 4)  Oxycodone Hcl 5 Mg Tabs (Oxycodone Hcl) .... Take One Tablet Every 6 Hrs As Needed Pain 5)  Calcium Carbonate 1250 Mg Tabs (Calcium Carbonate) .... One Tablet Daily 6)  Cellcept 500 Mg Tabs (Mycophenolate Mofetil) .... Take One Tablet Two Times A Day 7)  Crestor 10 Mg Tabs (Rosuvastatin Calcium) .... Take One Tablet Once Daily 8)   Cymbalta 60 Mg Cpep (Duloxetine Hcl) .... Take One Once Daily 9)  Prednisone 5 Mg Tabs (Prednisone) .... Take One Tablet Once Daily 10)  Ferrous Sulfate 325 (65 Fe) Mg Tabs (Ferrous Sulfate) .... Take One Tablet Once Daily 11)  Folic Acid 800 Mcg Tabs (Folic Acid) .... Take One Tablet Once Daily 12)  Flomax 0.4 Mg Caps (Tamsulosin Hcl) .... Take One Capsule Once Daily 13)  Januvia 100 Mg Tabs (Sitagliptin Phosphate) .... Take One Tablet Once Daily 14)  Magnesium Oxide 400 Mg Tabs (Magnesium Oxide) .... Two Times A Day 15)  Multivitamins  Tabs (Multiple Vitamin) .... Once Daily 16)  Prilosec 20 Mg Cpdr (Omeprazole) .... Once Daily 17)  Prograf 5 Mg Caps (Tacrolimus) .... Take One Tablet By Mouth Two Times A Day 18)  Restoril 15 Mg Caps (Temazepam) .... Take One Capsule Daily At Bedtime 19)  Vitamin B-12 500 Mcg Tabs (Cyanocobalamin) .... Take 1 Tablet By Mouth Once A Day 20)  Vitamin D3 10000 Unit Caps (Cholecalciferol) .... Take One Capsule Twice A Week 21)  Zetia 10 Mg Tabs (Ezetimibe) .... Take One Tablet Once Daily 22)  Novolog 100 Unit/ml Soln (Insulin Aspart) .... Sliding Scale With Insulin Pump. 23)  Carbidopa-Levodopa 10-100 Mg Tabs (Carbidopa-Levodopa) .... Take One Tablet Three Times A Day 24)  Pyridostigmine Bromide 60 Mg Tabs (Pyridostigmine Bromide) .... One  Tablet Two Times A Day 25)  Lisinopril 2.5 Mg Tabs (Lisinopril) .... On  Hold As Per Dr. Shirlee Latch Until Seen By Dr. Clifton James 26)  Plavix 75 Mg Tabs (Clopidogrel Bisulfate) .Marland Kitchen.. 1 Tab Once Daily  Allergies (verified): No Known Drug Allergies  Past History:  Past Medical History: 1. Diabetes mellitus 2. CKD s/p renal transplant 2002 3. HTN 4. PAD: Recent atherectomy right SFA and right popliteal on 01/12/10 5. Lumbar disc disease with low back pain 6. Diabetic gastroparesis 7. Prostate cancer s/p seed implantation in 1/06.  8. Erectile dysfunction 9. Tremor 10.  Obesity 11.  Diastolic CHF: Last echo (4/11) with  inferior and septal hypokinesis, moderate LV hypertrophy, EF 45-50%, mild left atrial enlargement.  12.  CAD: Cath (2/11) with 60% distal left main, 90% ostial LAD involving D1, 95% ostial large ramus, probable significant ostial CFX stenosis, 90% PDA, 70% PLV.  Paitent had CABG with LIMA-LAD, SVG-D, sequential SVG-ramus and OM2, SVG-PLV.   13.  Atrial fibrillation post-op CABG 14.  Orthostatic hypotension: Some improvement with pyridostigmine.   Social History: Reviewed history from 01/06/2010 and no changes required. 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  Exsmoker-smoked 1ppd for 25 years, stopped 1980 No alcohol No illicit drugs  Review of Systems  The patient denies fatigue, malaise, fever, weight gain/loss, vision loss, decreased hearing, hoarseness, chest pain, palpitations, shortness of breath, prolonged cough, wheezing, sleep apnea, coughing up blood, abdominal pain, blood in stool, nausea, vomiting, diarrhea, heartburn, incontinence, blood in urine, muscle weakness, joint pain, leg swelling, rash, skin lesions, headache, fainting, dizziness, depression, anxiety, enlarged lymph nodes, easy bruising or bleeding, and environmental allergies.         Left groin pain. Back pain.   Vital Signs:  Patient profile:   70 year old male Height:      73 inches Weight:      214 pounds BMI:     28.34 Pulse rate:   68 / minute Pulse rhythm:   irregular BP sitting:   120 / 52  (left arm) Cuff size:   regular  Vitals Entered By: Danielle Rankin, CMA (January 25, 2010 3:44 PM)  Physical Exam  General:  General: Well developed, well nourished, NAD Musculoskeletal: Muscle strength 5/5 all ext Psychiatric: Mood and affect normal Neck: No JVD, no carotid bruits, no thyromegaly, no lymphadenopathy. Lungs:Clear bilaterally, no wheezes, rhonci, crackles CV: RRR no murmurs, gallops rubs Abdomen: soft, NT, ND, BS present Extremities: No edema, pulses non-palpable bilateral lower  ext. Left groin with residual hematoma, no bruit.     Arterial Doppler  Procedure date:  01/25/2010  Findings:      Left groin u/s with no flow into the pseudoaneurysm.   Impression & Recommendations:  Problem # 1:  PVD WITH CLAUDICATION (ICD-443.89) s/p atherectomy of the right SFA and right popliteal, doing well. Unfortunately developed large pseudoaneurysm after the procedure at the left groin access site. Successful thrombin injection. Repeat u/s of left groin three months with ABI at that time. Continue ASA and Plavix.   Other Orders: EKG w/ Interpretation (93000) Arterial Duplex Lower Extremity (Arterial Duplex Low)  Patient Instructions: 1)  Your physician recommends that you schedule a follow-up appointment in: 6 months. We will send you a reminder in the mail. 2)  Your physician recommends that you continue on your current medications as directed. Please refer to the Current Medication list given to you today. 3)  Your physician has requested that you have an ankle brachial index (ABI). During this test an ultrasound and blood pressure cuff are used to evaluate  the arteries that supply the arms and legs with blood. Allow thirty minutes for this exam. There are no restrictions or special instructions. To be done in 3 months. 4)  We will repeat the ultrasound on your left groin to evaluate your pseudoaneurysm.  Appended Document: eph/wpa EKG today with NSR, PAC, LAFB.

## 2010-03-24 NOTE — Progress Notes (Signed)
Summary: order echo  Phone Note Other Incoming   Caller:  Dr [name]  Follow-up for Phone Call        Dr [name] discussed pt with Dr Mikka Kissner--recommended echocardiogram-pt will come 05-21-09 for echo per Dr Shirlee Latch  New Problems: PERICARDIAL EFFUSION (ICD-423.9) CORONARY ATHEROSCLEROSIS, ARTERY BYPASS GRAFT (ICD-414.04) DYSPNEA (ICD-786.05)   New Problems: PERICARDIAL EFFUSION (ICD-423.9) CORONARY ATHEROSCLEROSIS, ARTERY BYPASS GRAFT (ICD-414.04) DYSPNEA (ICD-786.05)

## 2010-03-24 NOTE — Progress Notes (Signed)
Summary: fall  Phone Note From Other Clinic Call back at [phone]   Caller: Kathy/[facility]/PT Summary of Call: pt had a fall this am, no injuries per pt, thick tongue when speaking @ times Initial call taken by: Migdalia Dk,  May 25, 2009 3:34 PM  Follow-up for Phone Call        talked with patient--pt got up out of chair and was walking into the bathroom with his walker,his legs felt weak and he  was able to let himself down to the floor--he did not hit anything,did not check B/P when he went to the floor --he checked his B/P about 1 hour after this incident  114/62-sitting doesn't  recall B/P standing after this happened.--pt states a little "thick tongue" /groggy right after nap when nurse came to visit earlier but pt denies any headache, muscle weakness or facial drooping/vision or speach problems--reviewed with Dr Era Skeen changes at present--will see Dr Shirlee Latch 05-26-09-pt advised to take his time when standing up/changing position

## 2010-03-24 NOTE — Letter (Signed)
Summary: [facility] Office Note  [facility] Office Note   Imported By: Roderic Ovens 08/02/2009 12:50:51  _____________________________________________________________________  External Attachment:    Type:   Image     Comment:   External Document

## 2010-03-24 NOTE — Letter (Signed)
Summary: [facility]  [facility]   Imported By: Marylou Mccoy 11/30/2009 10:04:00  _____________________________________________________________________  External Attachment:    Type:   Image     Comment:   External Document

## 2010-03-24 NOTE — Progress Notes (Signed)
Summary: QUESTION ON EXERISE  Phone Note Call from Patient Call back at Home Phone [phone]   Caller: Patient Reason for Call: Talk to Nurse Summary of Call: PT WOULD LIKE TO KNOW IF HE CAN EXCERISE ON HIS BIKE. PT WAS D/C FROM THE HOSPITAL ON 11/26/111. Initial call taken by: Roe Coombs,  January 18, 2010 10:30 AM  Follow-up for Phone Call        pt. is scheduled to have left femoral ultrasound 12/13 @ 1pm. He will f/u with Dr. Clifton James after that. Explained to pt. that he may excercise using his excercise bike since he is no longer experiencing any leg discomfort. He will stop if he does & will start out excercising gradually.  Whitney Maeola Sarah RN  January 18, 2010 11:53 AM  Follow-up by: Whitney Maeola Sarah RN,  January 18, 2010 11:53 AM

## 2010-03-24 NOTE — Miscellaneous (Signed)
Summary: [facility]   [facility]   Imported By: Roderic Ovens 10/01/2009 12:34:13  _____________________________________________________________________  External Attachment:    Type:   Image     Comment:   External Document

## 2010-03-24 NOTE — Progress Notes (Signed)
Summary: Calling regarding B/P  Phone Note From Other Clinic Call back at [phone]   Caller: [facility]/Amy Summary of Call: [facility] been taking B/p and yesterday it was 132/80 thats sitting standing 90/60 Initial call taken by: Judie Grieve,  June 02, 2009 4:20 PM  Follow-up for Phone Call        Spoke with [facility] from [facility]. Amy states pt. is off Amlodipine and continues been dizzy and B/P continues low: 134/80 sitting, 90/60 standing. I let Amy know I will send this message to MD and his nurse's desktop. Ollen Gross, RN, BSN  June 02, 2009 5:04 PM      Appended Document: Calling regarding B/P Make sure he is wearing his support hose.  Would hold Lasix for a day, then decrease dose to 40 mg two times a day to see if this helps. If he gets more short of breath or gains weight, he will need to call us and start back on 80 qam, 40 qpm.   Appended Document: Calling regarding B/P [facility] Nurse Amy aware of the above orders.  She states pt wore support hose for 3 days and didnt see an improvement.  Says the pt didn't feel it was worth the trouble to get them on and off.  I did tell Amy Dr Alford Highland orders are for the pt to wear them.  She will contact pt for follow up and call back if s/s don't improve with the support hose and change in Lasix.

## 2010-03-24 NOTE — Progress Notes (Signed)
Summary: Orthostatic @ Cardiac Rehab  Phone Note From Other Clinic   Caller: [facility] Summary of Call: I spoke with John [name] because the pt is orthostatic during cool down at cardiac rehab.  The pt's BP while standing was 66/44.  The pt felt okay at this time and was given 12 oz of water to drink.  BP came up to 72/50.  The pt was then given more fluid and BP remained the same at 72/50.  Terry sat the pt down and his BP was 98/60 and then she had him stand and it dropped to 60/40.  I clarified that the pt had increased his Pyridostigmine as instructed to 60mg  two times a day. John [name] is going to walk the pt and see if SBP gets above 90 and she will let the pt leave.  I will forward this message to Dr Shirlee Latch for review and other recommendations. Initial call taken by: Julieta Gutting, RN, BSN,  Jul 09, 2009 2:53 PM     Appended Document: Orthostatic @ Cardiac Rehab Would check in with him and see how he is doing today.  Not a lot of options available for orthostasis.  Already on pyridostigmine.  Could try low dose midodrine but would like to avoid if possible given CAD and CHF.   Appended Document: Orthostatic @ Cardiac Rehab I called and spoke with the pt and he said he has been doing fine.  The pt denies any episodes of dizziness or lightheadedness. The pt will continue his current medications.

## 2010-03-24 NOTE — Letter (Signed)
Summary: [facility]  [facility]   Imported By: Marylou Mccoy 07/30/2009 10:56:52  _____________________________________________________________________  External Attachment:    Type:   Image     Comment:   External Document

## 2010-03-24 NOTE — Assessment & Plan Note (Signed)
Summary: rov   Visit Type:  Follow-up Primary Provider:  Dr. [name]  CC:  ? shob.  History of Present Illness: 70 yo with history of renal transplant, DM, HTN, and CAD s/p CABG returns for followup.  Patient had a long and complicated hospital admission in 1/11.  Patient was admitted to [facility] at that time with a UTI and pulmonary edema.  Troponin was noted to be elevated and the patient had a nuclear scan with a large fixed inferior defect.  He ended up having a cardiac catheterization showing severe disease involving the LM, LAD, ramus, CFX and RCA.  He had CABG x 5.  Post-op course was complicated by pulmonary edema requiring intubation, renal failure requiring CVVH, and atrial fibrillation.  He was in the hospital about 7 weeks.   When I last saw him, he was having claudication in his right leg.  I arranged for right SFA and popliteal atherectomy.  This was complicated by a groin pseudoaneurysm which seems to have mostly resolved.  Patient is not longer having claudication.    Overall, patient is doing well.  He has lost another 5 lbs since last appointment.  He exercises daily on a stationary bike.  No exertional dyspnea or chest pain.  Less snoring with weight loss.  He does feel like his energy level is too low and he feels fatigued most of the time.  He was diagnosed with low testosterone and has been put on Androgel.  Orthostatic symptoms have resolved.   Labs (9/11): LDL 71, HDL 45 Labs (12/11): K 4.9, creatinine 0.9, HCT 37.1   Current Medications (verified): 1)  Furosemide 40 Mg Tabs (Furosemide) .... Take 1 in The Morning and 1 Tablet in The Afternoon 2)  Acetaminophen 325 Mg Tabs (Acetaminophen) .... Take 2 Tablets As Needed For Pain 3)  Aspirin 325 Mg Tabs (Aspirin) .... Once Daily 4)  Oxycodone Hcl 5 Mg Tabs (Oxycodone Hcl) .... Take One Tablet Every 6 Hrs As Needed Pain 5)  Calcium Carbonate 1250 Mg Tabs (Calcium Carbonate) .... One Tablet Daily 6)  Cellcept 500 Mg Tabs  (Mycophenolate Mofetil) .... Take One Tablet Two Times A Day 7)  Crestor 10 Mg Tabs (Rosuvastatin Calcium) .... Take One Tablet Once Daily 8)  Cymbalta 60 Mg Cpep (Duloxetine Hcl) .... Take One Once Daily 9)  Prednisone 5 Mg Tabs (Prednisone) .... Take One Tablet Once Daily 10)  Ferrous Sulfate 325 (65 Fe) Mg Tabs (Ferrous Sulfate) .... Take One Tablet Once Daily 11)  Folic Acid 800 Mcg Tabs (Folic Acid) .... Take One Tablet Once Daily 12)  Flomax 0.4 Mg Caps (Tamsulosin Hcl) .... Take One Capsule Once Daily 13)  Januvia 100 Mg Tabs (Sitagliptin Phosphate) .... Take One Tablet Once Daily 14)  Magnesium Oxide 400 Mg Tabs (Magnesium Oxide) .... Two Times A Day 15)  Multivitamins  Tabs (Multiple Vitamin) .... Once Daily 16)  Prilosec 20 Mg Cpdr (Omeprazole) .... Once Daily 17)  Prograf 5 Mg Caps (Tacrolimus) .... Take One Tablet By Mouth Two Times A Day 18)  Restoril 15 Mg Caps (Temazepam) .... Take One Capsule Daily At Bedtime 19)  Vitamin B-12 500 Mcg Tabs (Cyanocobalamin) .... Take 1 Tablet By Mouth Once A Day 20)  Vitamin D3 10000 Unit Caps (Cholecalciferol) .... Take One Capsule Twice A Week 21)  Zetia 10 Mg Tabs (Ezetimibe) .... Take One Tablet Once Daily 22)  Novolog 100 Unit/ml Soln (Insulin Aspart) .... Sliding Scale With Insulin Pump. 23)  Carbidopa-Levodopa 10-100  Mg Tabs (Carbidopa-Levodopa) .... Take One Tablet Three Times A Day 24)  Pyridostigmine Bromide 60 Mg Tabs (Pyridostigmine Bromide) .... One  Tablet Two Times A Day 25)  Lisinopril 2.5 Mg Tabs (Lisinopril) .... On Hold As Per Dr. Shirlee Latch Until Seen By Dr. Clifton James 26)  Plavix 75 Mg Tabs (Clopidogrel Bisulfate) .Marland Kitchen.. 1 Tab Once Daily 27)  Androgel 1.5mg  .... Four Times A Day  Allergies (verified): No Known Drug Allergies  Past History:  Past Medical History: 1. Diabetes mellitus 2. CKD s/p renal transplant 2002 3. HTN 4. PAD: Recent atherectomy right SFA and right popliteal on 01/12/10 5. Lumbar disc disease with low  back pain 6. Diabetic gastroparesis 7. Prostate cancer s/p seed implantation in 1/06.  8. Erectile dysfunction 9. Tremor 10.  Obesity 11.  Diastolic CHF: Last echo (4/11) with inferior and septal hypokinesis, moderate LV hypertrophy, EF 45-50%, mild left atrial enlargement.  12.  CAD: Cath (2/11) with 60% distal left main, 90% ostial LAD involving D1, 95% ostial large ramus, probable significant ostial CFX stenosis, 90% PDA, 70% PLV.  Paitent had CABG with LIMA-LAD, SVG-D, sequential SVG-ramus and OM2, SVG-PLV.   13.  Atrial fibrillation post-op CABG 14.  Orthostatic hypotension: Some improvement with pyridostigmine.  15.  Low testosterone 16.  Loculated right pleural effusion  Vital Signs:  Patient profile:   70 year old male Height:      73 inches Weight:      214.75 pounds Pulse rate:   84 / minute BP sitting:   128 / 68  (left arm) Cuff size:   regular  Vitals Entered By: Micki Riley CNA (March 02, 2010 1:56 PM)  Physical Exam  General:  Well developed, well nourished, in no acute distress. Neck:  Neck supple, no JVD. No masses, thyromegaly or abnormal cervical nodes. Lungs:  Decreased breath sounds right base.  Heart:  Non-displaced PMI, chest non-tender; regular rate and rhythm, S1, S2 without rubs or gallops. 2/6 systolic murmur along the left sternal border.  Carotid upstroke normal, no bruit. No edema.  Abdomen:  Bowel sounds positive; abdomen soft and non-tender without masses, organomegaly, or hernias noted. No hepatosplenomegaly. Extremities:  No clubbing or cyanosis. Neurologic:  Alert and oriented x 3. Psych:  Normal affect.   Impression & Recommendations:  Problem # 1:  CORONARY ATHEROSLERO UNSPEC TYPE BYPASS GRAFT (ICD-414.05) Stable with no ischemic symptoms.  Continue ASA, Plavix, statin.  No beta blocker due to very long 1st degree AV block.  Restart ACEI: lisinopril 2.5 mg daily.   Problem # 2:  PLEURAL EFFUSION, RIGHT (ICD-511.9) Still present by  exam.  No dyspnea.   Problem # 3:  PVD WITH CLAUDICATION (ICD-443.89) Claudication has resolved s/p right SFA and popliteal atherectomy.    Problem # 4:  PURE HYPERCHOLESTEROLEMIA (ICD-272.0) Lipids at goal (LDL < 70) at last check.    Problem # 5:  CHRONIC DIASTOLIC HEART FAILURE (ICD-428.32) No volume overload on exam.  No need for Lasix at this time.   Problem # 6:  FATIGUE Generalized fatigue and drowsiness.  Likely multifactorial and due to his chronic medical conditions and medications.  I will have him back off on pyridostigmine to 30 mg two times a day as this could be a contributor.  If orthostasis returns, he should increase pyridostigmine back to 60 mg two times a day.   Patient Instructions: 1)  Decrease Pyridostigmine to 30mg  two times a day. If you feel your lightheadedness gets worse, you may resume 60mg   two times a day. 2)  Start lisinopril 2.5mg  once daily. 3)  Your physician wants you to follow-up in:  6 months.  You will receive a reminder letter in the mail two months in advance. If you don't receive a letter, please call our office to schedule the follow-up appointment. Prescriptions: LISINOPRIL 2.5 MG TABS (LISINOPRIL) Take one tablet by mouth daily  #30 x 6   Entered by:   Sherri Rad, RN, BSN   Authorized by:   [name], MD   Signed by:   Sherri Rad, RN, BSN on 03/02/2010   Method used:   Electronically to        [facility]* (retail)       [address]       [place], [place]  [address]       Ph: [phone]       Fax: [phone]   RxID:   [id number]

## 2010-03-24 NOTE — Letter (Signed)
Summary: [facility]  Form  [facility]  Form   Imported By: Marylou Mccoy 03/02/2010 17:18:43  _____________________________________________________________________  External Attachment:    Type:   Image     Comment:   External Document

## 2010-03-24 NOTE — Assessment & Plan Note (Signed)
Summary: nph/per anne/jss   Primary Provider:  Dr. [name]  CC:  nph/follow up from CABG.  Pt is having difficulty ambulating without getting extremely dizzy.  Has been told he is orthostatic.  Marland Kitchen  History of Present Illness: 70 yo with history of renal transplant, DM, and HTN comes in to establish outpatient cardiology care after recent prolonged hospitalization.  Patient was admitted to [facility] on 1/28 with a UTI and pulmonary edema.  Troponin was noted to be elevated and the patient had a nuclear scan with a large fixed inferior defect.  He ended up having a cardiac catheterization showing severe disease involving the LM, LAD, ramus, CFX and RCA.  He had CABG x 5.  Post-op course was complicated by pulmonary edema requiring intubation, renal failure requiring CVVH, and atrial fibrillation.  He was in the hospital about 7 weeks.  He is now at home post-discharge.  His main problem at this time is orthostatic symptoms.  He is lightheaded after standing, which is limiting his rehab.  He fell yesterday while walking to the bathroom.  He has had no chest pain.  He is able to walk about 100 feet with his walker before getting short of breath. He was more short of breath immediately after leaving the hospital but Lasix was increased to 80 mg qam and 40 mg qpm, which has helped.   ECG: NSR, 1st degree AV block (profound, 360 msec), LAFB, poor anterior R wave progression.   Labs (3/11): HCT 35.1  Current Medications (verified): 1)  Amiodarone Hcl 200 Mg Tabs (Amiodarone Hcl) .... Take One Tablet Once Daily 2)  Furosemide 40 Mg Tabs (Furosemide) .... Take 2 in The Morning and 1 Tablet in The Afternoon 3)  Acetaminophen 325 Mg Tabs (Acetaminophen) .... Take 2 Tablets As Needed For Pain 4)  Aspirin 325 Mg Tabs (Aspirin) .... Once Daily 5)  Eucerin  Crea (Skin Protectants, Misc.) .Marland Kitchen.. 1 Application Topically Two Times A Day 6)  Oxycodone Hcl 5 Mg Tabs (Oxycodone Hcl) .... Take One Tablet Every 6 Hrs As  Needed Pain 7)  Seroquel 100 Mg Tabs (Quetiapine Fumarate) .... Take One Tablet Once Daily 8)  Calcium Carbonate 1250 Mg Tabs (Calcium Carbonate) .... One Tablet Daily 9)  Cellcept 500 Mg Tabs (Mycophenolate Mofetil) .... Take One Tablet Once Daily 10)  Crestor 10 Mg Tabs (Rosuvastatin Calcium) .... Take One Tablet Once Daily 11)  Cymbalta 60 Mg Cpep (Duloxetine Hcl) .... Take One Once Daily 12)  Prednisone 5 Mg Tabs (Prednisone) .... Take One Tablet Once Daily 13)  Ferrous Sulfate 325 (65 Fe) Mg Tabs (Ferrous Sulfate) .... Take One Tablet Once Daily 14)  Folic Acid 800 Mcg Tabs (Folic Acid) .... Take One Tablet Once Daily 15)  Flomax 0.4 Mg Caps (Tamsulosin Hcl) .... Take One Capsule Once Daily 16)  Januvia 100 Mg Tabs (Sitagliptin Phosphate) .... Take One Tablet Once Daily 17)  Magnesium Oxide 400 Mg Tabs (Magnesium Oxide) .... Two Times A Day 18)  Multivitamins  Tabs (Multiple Vitamin) .... Once Daily 19)  Prilosec 20 Mg Cpdr (Omeprazole) .... Once Daily 20)  Prograf 5 Mg Caps (Tacrolimus) .... Take One Tablet By Mouth Two Times A Day 21)  Restoril 15 Mg Caps (Temazepam) .... Take One Capsule Daily At Bedtime 22)  Vitamin B-12 100 Mcg Tabs (Cyanocobalamin) .... Take One Tablet By Mouth Once Daily 23)  Vitamin D3 10000 Unit Caps (Cholecalciferol) .... Take One Capsule Twice A Week 24)  Zetia 10 Mg Tabs (  Ezetimibe) .... Take One Tablet Once Daily 25)  Novolog 100 Unit/ml Soln (Insulin Aspart) .... Sliding Scale With Insulin Pump.  Allergies (verified): No Known Drug Allergies  Past History:  Past Medical History: 1. Diabetes mellitus 2. CKD s/p renal transplant 2002 3. HTN 4. PAD: Followed by Dr. Edilia Bo 5. Lumbar disc disease with low back pain 6. Diabetic gastroparesis 7. Prostate cancer s/p seed implantation in 1/06.  8. Erectile dysfunction 9. Tremor 10.  Obesity 11.  Diastolic CHF: Last echo (4/11) with inferior and septal hypokinesis, moderate LV hypertrophy, EF 45-50%,  mild left atrial enlargement.  12.  CAD: Cath (2/11) with 60% distal left main, 90% ostial LAD involving D1, 95% ostial large ramus, probable significant ostial CFX stenosis, 90% PDA, 70% PLV.  Paitent had CABG with LIMA-LAD, SVG-D, sequential SVG-ramus and OM2, SVG-PLV.   13.  Atrial fibrillation post-op CABG 14.  Orthostatic hypotension  Family History: Noncontributory  Social History: Married, retired Clinical research associate, lives in [place].  Nonsmoker.   Review of Systems       All systems reviewed and negative except as per HPI.   Vital Signs:  Patient profile:   70 year old male Height:      73 inches Weight:      244 pounds BMI:     32.31 Pulse rate:   78 / minute Pulse (ortho):   87 / minute Pulse rhythm:   regular BP sitting:   158 / 74  (left arm) BP standing:   104 / 62 Cuff size:   large  Vitals Entered By: Judithe Modest CMA (May 26, 2009 3:58 PM)  Serial Vital Signs/Assessments:  Time      Position  BP       Pulse  Resp  Temp     By 5:05 PM   Sitting   145/76   82                    Amanda Trulove CMA 5:05 PM   Standing  104/62   87                    Amanda Trulove CMA  Comments: 5:05 PM 2 minutes-134/66 HR 87 3 minutes-121/60 HR 86  Pt in wheelchair.  Orthostatics ordered from sitting position.  Pt reported dizziness.  No other symptoms.  Left arm used By: Judithe Modest CMA    Physical Exam  General:  Well developed, well nourished, in no acute distress. Obese.  Head:  normocephalic and atraumatic Nose:  no deformity, discharge, inflammation, or lesions Mouth:  Teeth, gums and palate normal. Oral mucosa normal. Neck:  Neck supple, no JVD. No masses, thyromegaly or abnormal cervical nodes. Lungs:  Clear bilaterally to auscultation and percussion. Heart:  Non-displaced PMI, chest non-tender; regular rate and rhythm, S1, S2 without murmurs, rubs or gallops. Paradoxical spleeting of S2.  Carotid upstroke normal, no bruit. Trace ankle edema.  Abdomen:  Bowel  sounds positive; abdomen soft and non-tender without masses, organomegaly, or hernias noted. No hepatosplenomegaly. Msk:  Back normal, normal gait. Muscle strength and tone normal. Extremities:  No clubbing or cyanosis. Neurologic:  Alert and oriented x 3. Skin:  Intact without lesions or rashes. Psych:  Normal affect.   Impression & Recommendations:  Problem # 1:  ORTHOSTATIC HYPOTENSION (ICD-458.0) Patient was significantly orthostatic when measured in clinic today. He has been taken off all blood pressure-active medications except amiodarone, which I will stop today.  He may have  autonomic neuropathy from diabetes.  He has had orthostatic symptoms with syncope in the past actually prior to this last hospitalization.  I am also going to have him start wearing thigh-high graded compression stockings.    Problem # 2:  CHRONIC DIASTOLIC HEART FAILURE (ICD-428.32) Volume status looks ok.  His shortness of breath has improved with increased Lasix. I will have him continue the current dose.  He does not think that his orthostatic symptoms worsened after increasing Lasix recently.   Problem # 3:  CORONARY ATHEROSCLEROSIS, ARTERY BYPASS GRAFT (ICD-414.04) Stable with no chest pain.  Continue ASA and Crestor.  Goal LDL will be < 70.    Problem # 4:  ATRIAL FIBRILLATION (ICD-427.31) Post-op atrial fib. It has been over a month since he had atrial fibrillation.  I am going to let him go ahead and stop amiodarone.  He will continue ASA 325 mg daily.  If he has a recurrence of atrial fibrillation, we will need to consider coumadin or Pradaxa.   Other Orders: Cardiac Rehabilitation (Cardiac Rehab)  Patient Instructions: 1)  Your physician has recommended you make the following change in your medication:  2)  Stop Amiodarone 3)  Use thigh high graded compression stockings -you have the prescription. 4)  Lab next week--the home health can do this--BMP/BNP-you have the order. 5)  Your physician  recommends referral and attendance at a Cardiac Rehab Program. 6)  Your physician recommends that you schedule a follow-up appointment in: 1 month with Dr Shirlee Latch.

## 2010-03-24 NOTE — Assessment & Plan Note (Signed)
Summary: f51m   Primary Provider:  Dr. [name]  CC:  f69m/ Pt feeling much better this visit than last.  .  History of Present Illness: 70 yo with history of renal transplant, DM, HTN, and CAD s/p CABG returns for followup.  Patient had a recent long and complicated hospital admission.  Patient was admitted to [facility] on 03/19/09 with a UTI and pulmonary edema.  Troponin was noted to be elevated and the patient had a nuclear scan with a large fixed inferior defect.  He ended up having a cardiac catheterization showing severe disease involving the LM, LAD, ramus, CFX and RCA.  He had CABG x 5.  Post-op course was complicated by pulmonary edema requiring intubation, renal failure requiring CVVH, and atrial fibrillation.  He was in the hospital about 7 weeks.  He is now at home post-discharge.    His main problem since discharge has been orthostatic hypotension.  He was started on pyridostigmine for this, and felt great for the first 10 days.  After this, some lightheadedness with standing did come back.  Now, he is lightheaded immediately upon standing, but this resolves after he has been upright for a short period.  He tried compression stockings but had a hard time getting them on.  No syncopal episodes.  He had 2 falls that sound mechanical (knee gave out once, another time he tripped).  He does feel much better than at prior appointment.  He is not significantly short of breath though he is not getting much exercise.  Weight is down 14 lbs.   Labs (3/11): HCT 35.1, LDL 79, HDL 63 Labs (4/11): K 5.1, creatinine 1.78  Current Medications (verified): 1)  Furosemide 40 Mg Tabs (Furosemide) .... Take 2 in The Morning and 1 Tablet in The Afternoon 2)  Acetaminophen 325 Mg Tabs (Acetaminophen) .... Take 2 Tablets As Needed For Pain 3)  Aspirin 325 Mg Tabs (Aspirin) .... Once Daily 4)  Eucerin  Crea (Skin Protectants, Misc.) .Marland Kitchen.. 1 Application Topically Two Times A Day 5)  Oxycodone Hcl 5 Mg Tabs  (Oxycodone Hcl) .... Take One Tablet Every 6 Hrs As Needed Pain 6)  Calcium Carbonate 1250 Mg Tabs (Calcium Carbonate) .... One Tablet Daily 7)  Cellcept 500 Mg Tabs (Mycophenolate Mofetil) .... Take One Tablet Once Daily 8)  Crestor 10 Mg Tabs (Rosuvastatin Calcium) .... Take One Tablet Once Daily 9)  Cymbalta 60 Mg Cpep (Duloxetine Hcl) .... Take One Once Daily 10)  Prednisone 5 Mg Tabs (Prednisone) .... Take One Tablet Once Daily 11)  Ferrous Sulfate 325 (65 Fe) Mg Tabs (Ferrous Sulfate) .... Take One Tablet Once Daily 12)  Folic Acid 800 Mcg Tabs (Folic Acid) .... Take One Tablet Once Daily 13)  Flomax 0.4 Mg Caps (Tamsulosin Hcl) .... Take One Capsule Once Daily 14)  Januvia 100 Mg Tabs (Sitagliptin Phosphate) .... Take One Tablet Once Daily 15)  Magnesium Oxide 400 Mg Tabs (Magnesium Oxide) .... Two Times A Day 16)  Multivitamins  Tabs (Multiple Vitamin) .... Once Daily 17)  Prilosec 20 Mg Cpdr (Omeprazole) .... Once Daily 18)  Prograf 5 Mg Caps (Tacrolimus) .... Take One Tablet By Mouth Two Times A Day 19)  Restoril 15 Mg Caps (Temazepam) .... Take One Capsule Daily At Bedtime 20)  Vitamin B-12 100 Mcg Tabs (Cyanocobalamin) .... Take One Tablet By Mouth Once Daily 21)  Vitamin D3 10000 Unit Caps (Cholecalciferol) .... Take One Capsule Twice A Week 22)  Zetia 10 Mg Tabs (Ezetimibe) .Marland KitchenMarland KitchenMarland Kitchen  Take One Tablet Once Daily 23)  Novolog 100 Unit/ml Soln (Insulin Aspart) .... Sliding Scale With Insulin Pump. 24)  Carbidopa-Levodopa 10-100 Mg Tabs (Carbidopa-Levodopa) .... Take One Tablet Two Times A Day 25)  Pyridostigmine Bromide 60 Mg Tabs (Pyridostigmine Bromide) .... 1/2 Tablet Two Times A Day  Allergies (verified): No Known Drug Allergies  Past History:  Past Medical History: 1. Diabetes mellitus 2. CKD s/p renal transplant 2002 3. HTN 4. PAD: Followed by Dr. Edilia Bo 5. Lumbar disc disease with low back pain 6. Diabetic gastroparesis 7. Prostate cancer s/p seed implantation in 1/06.    8. Erectile dysfunction 9. Tremor 10.  Obesity 11.  Diastolic CHF: Last echo (4/11) with inferior and septal hypokinesis, moderate LV hypertrophy, EF 45-50%, mild left atrial enlargement.  12.  CAD: Cath (2/11) with 60% distal left main, 90% ostial LAD involving D1, 95% ostial large ramus, probable significant ostial CFX stenosis, 90% PDA, 70% PLV.  Paitent had CABG with LIMA-LAD, SVG-D, sequential SVG-ramus and OM2, SVG-PLV.   13.  Atrial fibrillation post-op CABG 14.  Orthostatic hypotension: Some improvement with pyridostigmine.   Family History: Reviewed history from 05/26/2009 and no changes required. Noncontributory  Social History: Reviewed history from 05/26/2009 and no changes required. 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   Review of Systems       All systems reviewed and negative except  Vital Signs:  Patient profile:   70 year old male Height:      73 inches Weight:      230 pounds Pulse rate:   86 / minute Pulse rhythm:   regular BP sitting:   126 / 74  (left arm) Cuff size:   large  Vitals Entered By: Judithe Modest CMA (Jun 24, 2009 9:54 AM)  Physical Exam  General:  Well developed, well nourished, in no acute distress. Obese.  Neck:  Neck supple, no JVD. No masses, thyromegaly or abnormal cervical nodes. Lungs:  Clear bilaterally to auscultation and percussion. Heart:  Non-displaced PMI, chest non-tender; regular rate and rhythm, S1, S2 without murmurs, rubs or gallops. Paradoxical splitting of S2.  Carotid upstroke normal, no bruit. Trace ankle edema.  Abdomen:  Bowel sounds positive; abdomen soft and non-tender without masses, organomegaly, or hernias noted. No hepatosplenomegaly. Extremities:  No clubbing or cyanosis. Neurologic:  Alert and oriented x 3. Psych:  Normal affect.   Impression & Recommendations:  Problem # 1:  ATRIAL FIBRILLATION (ICD-427.31) Post-op atrial fib. No evidence for any further episodes.  He will  continue ASA 325 mg daily.  If he has a recurrence of atrial fibrillation, we will need to consider coumadin (probably not Pradaxa candidate given CKD.   Problem # 2:  CHRONIC DIASTOLIC HEART FAILURE (ICD-428.32) Volume status looks ok.  He is not particularly short of breath.  I will have him continue the current dose of Lasix.  Lasix does not seem to affect his orthostatic symptoms.    Problem # 3:  CORONARY ATHEROSCLEROSIS, ARTERY BYPASS GRAFT (ICD-414.04) Stable with no chest pain.  Continue ASA and Crestor.  Goal LDL will be < 70.  Eventually would like to get him on a beta blocker but will hold off with continuing orthostatic symptoms.  Patient will be starting cardiac rehab.  I think this is reasonable as his orthostatic symptoms are somewhat improved.   Problem # 4:  ORTHOSTATIC HYPOTENSION (ICD-458.0) Symptoms are improved with pyridostigmine.  Now they occur mainly just upon standing and resolve with more prolonged  standing/walking.  He had some response to pyridostigmine.  Given CAD and CHF would prefer to avoid midodrine and fludrocortisone.  He is not bradycardic on pyridostigmine so I think it would be reasonable to gently increase it to 60 mg two times a day with monitoring.    Problem # 5:  HYPERLIPIDEMIA-MIXED (ICD-272.4) LDL close to goal (< 70) when recently checked.   Patient Instructions: 1)  Your physician has recommended you make the following change in your medication:  2)  Increase Pyridostigmine to 60mg  twice a day 3)  Appointment with Dr Shirlee Latch in 4 months. 4)    Prescriptions: PYRIDOSTIGMINE BROMIDE 60 MG TABS (PYRIDOSTIGMINE BROMIDE) one  tablet two times a day  #60 x 12   Entered by:   Katina Dung, RN, BSN   Authorized by:   [name], MD   Signed by:   Katina Dung, RN, BSN on 06/24/2009   Method used:   Electronically to        [facility] (retail)       [address]       [place], [place]  [address]       Ph:  [phone] or [phone]       Fax: [phone]   RxID:   [id number]

## 2010-03-24 NOTE — Letter (Signed)
Summary: [facility] Office Note  [facility] Office Note   Imported By: Roderic Ovens 06/07/2009 11:38:28  _____________________________________________________________________  External Attachment:    Type:   Image     Comment:   External Document

## 2010-03-24 NOTE — Letter (Signed)
Summary: [facility] Office Note  [facility] Office Note   Imported By: Roderic Ovens 07/30/2009 11:50:34  _____________________________________________________________________  External Attachment:    Type:   Image     Comment:   External Document

## 2010-03-24 NOTE — Letter (Signed)
Summary: [facility] Office Progress Note   [facility] Office Progress Note   Imported By: Roderic Ovens 02/17/2010 09:55:56  _____________________________________________________________________  External Attachment:    Type:   Image     Comment:   External Document

## 2010-03-24 NOTE — Consult Note (Signed)
Summary: [facility]   Imported By: Roderic Ovens 04/12/2009 14:43:21  _____________________________________________________________________  External Attachment:    Type:   Image     Comment:   External Document

## 2010-03-24 NOTE — Miscellaneous (Signed)
Summary: [facility]iac Progress Note   [facility] Progress Note   Imported By: Roderic Ovens 11/03/2009 12:07:26  _____________________________________________________________________  External Attachment:    Type:   Image     Comment:   External Document

## 2010-03-24 NOTE — Letter (Signed)
Summary: [facility] Office Progress Note   [facility] Office Progress Note   Imported By: Roderic Ovens 09/16/2009 15:47:52  _____________________________________________________________________  External Attachment:    Type:   Image     Comment:   External Document

## 2010-03-24 NOTE — Letter (Signed)
Summary: Peripheral Vascular  [facility], Main Office  [address]   [place], [place] [address]   Phone: [phone]  Fax: [phone]     12/30/2009 MRN: [id number]  [place] [address] [place], [place]  [address]  Dear Mr. Belgard,   You are scheduled for Peripheral Vascular Angiogram on 01/12/10             with Dr. Clifton James.  Please arrive at the [facility] at 8:00      a.m.on the day of your procedure.  1. DIET     __X__ Nothing to eat or drink after midnight except your medications with a sip of water.  2. Come to the [place] office on  01/05/10  for lab work.  The lab at [facility] is open from 8:30 a.m. to 1:30 p.m. and 2:30 p.m. to 5:00 p.m.  The lab at [facility] is open from 7:30 a.m. to 5:30 p.m.  You do not have to be fasting.  3. MAKE SURE YOU TAKE YOUR ASPIRIN.  4. ___X__ DO NOT TAKE these medications before your procedure:     Since you have an insulin pump, please dose accordingly. Only take 1/2 of your Insulin the night before & no coverage that morning. Do not take your Januvia the morning of the procedure.      __x__ YOU MAY TAKE ALL of your remaining medications with a small amount of water.   5. Plan for one night stay - bring personal belongings (i.e. toothpaste, toothbrush, etc.)  6. Bring a current list of your medications and current insurance cards.  7. Must have a responsible person to drive you home.   8. Someone must be with yu for the first 24 hours after you arrive home.  9. Please wear clothes that are easy to get on and off and wear slip-on shoes.  *Special note: Every effort is made to have your procedure done on time.  Occasionally there are emergencies that present themselves at the hospital that may cause delays.  Please be patient if a delay does occur.  If you have any questions after you get home, please call the office at the number listed  above.   Whitney Maeola Sarah RN

## 2010-03-24 NOTE — Progress Notes (Signed)
  Walk in Patient Form Recieved " Pt has Lack of Energywould like to speak with Nurse" sent to [facility]  February 24, 2010 1:34 PM

## 2010-03-24 NOTE — Assessment & Plan Note (Signed)
Summary: Pulmonary Consultation   Primary Provider/Referring Provider:  Dr. [name]  CC:  Pulmonary consult and pleural effusion.  History of Present Illness: Pulmonary Consultation       This is a 70 year old male who presents with pleural effusion.  The patient has no history of history of diagnosed COPD, shortness of breath, chest tightness, chest pain worse with breathing and coughing, wheezing, cough, mucous production, nocturnal awakening, exercise induced symptoms, and congestion.  The patient comes in today for initial evaluation of pleural effusion.  Evaluation to date has included CXR.  The chest X-ray reveals a right pleural effusion.   This pt has a loculated R pleural effusion on CXR. This pt had a complicated course after CABG 2/11. Last CXR showed effusion R lung 3/11. No films since.   PT is s/p renal TPL 2002. There are no new symptoms, no cough, no chest pain. No pulm complaints at all PT has fatigue in rehab only.    Preventive Screening-Counseling & Management  Alcohol-Tobacco     Alcohol drinks/day: 0     Smoking Status: quit     Packs/Day: 1.0     Year Started: 1955     Year Quit: 1980  Current Medications (verified): 1)  Furosemide 40 Mg Tabs (Furosemide) .... Take 1 in The Morning and 1 Tablet in The Afternoon 2)  Acetaminophen 325 Mg Tabs (Acetaminophen) .... Take 2 Tablets As Needed For Pain 3)  Aspirin 325 Mg Tabs (Aspirin) .... Once Daily 4)  Oxycodone Hcl 5 Mg Tabs (Oxycodone Hcl) .... Take One Tablet Every 6 Hrs As Needed Pain 5)  Calcium Carbonate 1250 Mg Tabs (Calcium Carbonate) .... One Tablet Daily 6)  Cellcept 500 Mg Tabs (Mycophenolate Mofetil) .... Take One Tablet Two Times A Day 7)  Crestor 10 Mg Tabs (Rosuvastatin Calcium) .... Take One Tablet Once Daily 8)  Cymbalta 60 Mg Cpep (Duloxetine Hcl) .... Take One Once Daily 9)  Prednisone 5 Mg Tabs (Prednisone) .... Take One Tablet Once Daily 10)  Ferrous Sulfate 325 (65 Fe) Mg Tabs (Ferrous  Sulfate) .... Take One Tablet Once Daily 11)  Folic Acid 800 Mcg Tabs (Folic Acid) .... Take One Tablet Once Daily 12)  Flomax 0.4 Mg Caps (Tamsulosin Hcl) .... Take One Capsule Once Daily 13)  Januvia 100 Mg Tabs (Sitagliptin Phosphate) .... Take One Tablet Once Daily 14)  Magnesium Oxide 400 Mg Tabs (Magnesium Oxide) .... Two Times A Day 15)  Multivitamins  Tabs (Multiple Vitamin) .... Once Daily 16)  Prilosec 20 Mg Cpdr (Omeprazole) .... Once Daily 17)  Prograf 5 Mg Caps (Tacrolimus) .... Take One Tablet By Mouth Two Times A Day 18)  Restoril 15 Mg Caps (Temazepam) .... Take One Capsule Daily At Bedtime 19)  Vitamin B-12 500 Mcg Tabs (Cyanocobalamin) .... Take 1 Tablet By Mouth Once A Day 20)  Vitamin D3 10000 Unit Caps (Cholecalciferol) .... Take One Capsule Twice A Week 21)  Zetia 10 Mg Tabs (Ezetimibe) .... Take One Tablet Once Daily 22)  Novolog 100 Unit/ml Soln (Insulin Aspart) .... Sliding Scale With Insulin Pump. 23)  Carbidopa-Levodopa 10-100 Mg Tabs (Carbidopa-Levodopa) .... Take One Tablet Three Times A Day 24)  Pyridostigmine Bromide 60 Mg Tabs (Pyridostigmine Bromide) .... One  Tablet Two Times A Day 25)  Lisinopril 2.5 Mg Tabs (Lisinopril) .... One Daily  Allergies (verified): No Known Drug Allergies  Past History:  Past medical, surgical, family and social histories (including risk factors) reviewed, and no changes noted (except as  noted below).  Past Medical History: Reviewed history from 06/24/2009 and no changes required. 1. Diabetes mellitus 2. CKD s/p renal transplant 2002 3. HTN 4. PAD: Followed by Dr. Edilia Bo 5. Lumbar disc disease with low back pain 6. Diabetic gastroparesis 7. Prostate cancer s/p seed implantation in 1/06.  8. Erectile dysfunction 9. Tremor 10.  Obesity 11.  Diastolic CHF: Last echo (4/11) with inferior and septal hypokinesis, moderate LV hypertrophy, EF 45-50%, mild left atrial enlargement.  12.  CAD: Cath (2/11) with 60% distal left  main, 90% ostial LAD involving D1, 95% ostial large ramus, probable significant ostial CFX stenosis, 90% PDA, 70% PLV.  Paitent had CABG with LIMA-LAD, SVG-D, sequential SVG-ramus and OM2, SVG-PLV.   13.  Atrial fibrillation post-op CABG 14.  Orthostatic hypotension: Some improvement with pyridostigmine.   Past Surgical History: CABG 2/11 Cataract extraction Vitrectomy Tonsillectomy Kidney transplant  2002 Hernia surgery Prostate CA Rx 2006 brachytherapy Prior Peritoneal catheter placement  Family History: Reviewed history from 11/30/2009 and no changes required. Mother-deceased age 67, CAD Father-deceased, valvular heart disease 1 brother and 3 sisters all alive, one with hepatits. One sister with CAD/MI Family History Tuberculosis-father  Social History: Reviewed history from 11/30/2009 and no changes required. 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  Exsmoker-smoked 1ppd for 25 years, stopped 1980 No alcohol No illicit drugsPacks/Day:  1.0 Smoking Status:  quit Alcohol drinks/day:  0  Review of Systems       The patient complains of weight change, tooth/dental problems, headaches, nasal congestion/difficulty breathing through nose, itching, anxiety, depression, and joint stiffness or pain.  The patient denies shortness of breath with activity, shortness of breath at rest, productive cough, non-productive cough, coughing up blood, chest pain, irregular heartbeats, acid heartburn, indigestion, loss of appetite, abdominal pain, difficulty swallowing, sore throat, sneezing, ear ache, hand/feet swelling, rash, change in color of mucus, and fever.        See HPI for Pulmonary, Cardiac, General, and ENT review of systems.  Vital Signs:  Patient profile:   70 year old male Height:      73 inches Weight:      215 pounds BMI:     28.47 O2 Sat:      97 % on Room air Temp:     97.7 degrees F oral Pulse rate:   71 / minute BP sitting:   150 / 76  (right arm) Cuff  size:   regular  Vitals Entered By: Zackery Barefoot CMA (January 06, 2010 2:51 PM)  O2 Flow:  Room air  CC: Pulmonary consult, pleural effusion Comments Medications reviewed with patient Verified contact number and pharmacy with patient Zackery Barefoot CMA  January 06, 2010 2:55 PM    Physical Exam  Additional Exam:  Gen: Pleasant, well-nourished, in no distress,  normal affect ENT: No lesions,  mouth clear,  oropharynx clear, no postnasal drip Neck: No JVD, no TMG, no carotid bruits Lungs: No use of accessory muscles, dull to percusion RLL and decreased BS RLL Cardiovascular: RRR, heart sounds normal, no murmur or gallops, no peripheral edema Abdomen: soft and NT, no HSM,  BS normal Musculoskeletal: No deformities, no cyanosis or clubbing Neuro: alert, non focal Skin: Warm, no lesions or rashes    CXR  Procedure date:  01/06/2010  Findings:      IMPRESSION: COPD.   Enlarging loculated right pleural effusion.  Right lung airspace disease, likely atelectasis.  Impression & Recommendations:  Problem # 1:  PLEURAL EFFUSION, RIGHT (ICD-511.9)  asymtomatic R pleural effusion loculated plan will discuss with dr [name], I am tempted to watch for now Orders: T-2 View CXR (71020TC) New Patient Level V (04540)  Medications Added to Medication List This Visit: 1)  Vitamin B-12 500 Mcg Tabs (Cyanocobalamin) .... Take 1 tablet by mouth once a day  Complete Medication List: 1)  Furosemide 40 Mg Tabs (Furosemide) .... Take 1 in the morning and 1 tablet in the afternoon 2)  Acetaminophen 325 Mg Tabs (Acetaminophen) .... Take 2 tablets as needed for pain 3)  Aspirin 325 Mg Tabs (Aspirin) .... Once daily 4)  Oxycodone Hcl 5 Mg Tabs (Oxycodone hcl) .... Take one tablet every 6 hrs as needed pain 5)  Calcium Carbonate 1250 Mg Tabs (Calcium carbonate) .... One tablet daily 6)  Cellcept 500 Mg Tabs (Mycophenolate mofetil) .... Take one tablet two times a day 7)  Crestor 10 Mg Tabs  (Rosuvastatin calcium) .... Take one tablet once daily 8)  Cymbalta 60 Mg Cpep (Duloxetine hcl) .... Take one once daily 9)  Prednisone 5 Mg Tabs (Prednisone) .... Take one tablet once daily 10)  Ferrous Sulfate 325 (65 Fe) Mg Tabs (Ferrous sulfate) .... Take one tablet once daily 11)  Folic Acid 800 Mcg Tabs (Folic acid) .... Take one tablet once daily 12)  Flomax 0.4 Mg Caps (Tamsulosin hcl) .... Take one capsule once daily 13)  Januvia 100 Mg Tabs (Sitagliptin phosphate) .... Take one tablet once daily 14)  Magnesium Oxide 400 Mg Tabs (Magnesium oxide) .... Two times a day 15)  Multivitamins Tabs (Multiple vitamin) .... Once daily 16)  Prilosec 20 Mg Cpdr (Omeprazole) .... Once daily 17)  Prograf 5 Mg Caps (Tacrolimus) .... Take one tablet by mouth two times a day 18)  Restoril 15 Mg Caps (Temazepam) .... Take one capsule daily at bedtime 19)  Vitamin B-12 500 Mcg Tabs (Cyanocobalamin) .... Take 1 tablet by mouth once a day 20)  Vitamin D3 10000 Unit Caps (Cholecalciferol) .... Take one capsule twice a week 21)  Zetia 10 Mg Tabs (Ezetimibe) .... Take one tablet once daily 22)  Novolog 100 Unit/ml Soln (Insulin aspart) .... Sliding scale with insulin pump. 23)  Carbidopa-levodopa 10-100 Mg Tabs (Carbidopa-levodopa) .... Take one tablet three times a day 24)  Pyridostigmine Bromide 60 Mg Tabs (Pyridostigmine bromide) .... One  tablet two times a day 25)  Lisinopril 2.5 Mg Tabs (Lisinopril) .... One daily  Patient Instructions: 1)  Will observe for now 2)  Call if increased shortness of breath, cough or chest pain occur. 3)  Lets see how your do after your leg vascular procedure 4)  Return as needed    Immunization History:  Influenza Immunization History:    Influenza:  historical (10/25/2009)  Pneumovax Immunization History:    Pneumovax:  historical (12/21/2008)    Appended Document: Pulmonary Consultation fax brian bartle, ravi avva

## 2010-03-24 NOTE — Letter (Signed)
Summary: Erick Colace MD/[facility]  Erick Colace MD/[facility]   Imported By: Lester [place] 01/11/2010 09:08:15  _____________________________________________________________________  External Attachment:    Type:   Image     Comment:   External Document

## 2010-03-24 NOTE — Letter (Signed)
Summary: [facility]   Imported By: Lester [place] 02/25/2010 08:23:03  _____________________________________________________________________  External Attachment:    Type:   Image     Comment:   External Document

## 2010-03-24 NOTE — Assessment & Plan Note (Signed)
Summary: eval for  right SFA stenosis with symptoms   Visit Type:  new PV pt visit Primary Provider:  Dr. [name]  CC:  edema/right calf....no other complaints today.Marland KitchenMarland Kitchenpt wants to Dr. Alford Highland nurse Katina Dung today due to he was just put on Lisinopril 2.5 and pt thinks he is having a reaction such as fatigue to this and wants to know if dose can be lowered or not.Marland Kitchen  History of Present Illness: 70 yo male with history of CAD s/p CABG, renal transplant, HTN, DM and post-op atrial fibrillation who is here today as a new PV patient. His cardiac issues are followed by Dr. Shirlee Latch. He has had had problems with bilateral  lower extremity pain for over ten years and has been seen in the past by Dr. Edilia Bo with [facility].  An angiogram from 2003 by Dr. Edilia Bo is reviewed today and he was found to have diffuse lower ext disease and occluded left ATA. No mention of the right leg in the procedure report. He has continued to have pain in both legs with ambulation.   He tells me that since his bypass was done in February 2010, he has been slowly recovering. He has had ongoing issues with lower back pain secondary to bulging discs at L4/L5/S1. He is considering an injection per ortho. He has been riding an exercise bike 3-4 days per week and has no pain in his calf muscles or back. When he walks, he notices pain in the right calf muscle. This starts at 100 yards of walking. Sometimes he can walk 1/2 mile without any pain in the right calf muscle. Overalll, he is feeling well. He does note fatigue since starting the low dose of Lisinopril last month.   Current Medications (verified): 1)  Furosemide 40 Mg Tabs (Furosemide) .... Take 1 in The Morning and 1 Tablet in The Afternoon 2)  Acetaminophen 325 Mg Tabs (Acetaminophen) .... Take 2 Tablets As Needed For Pain 3)  Aspirin 325 Mg Tabs (Aspirin) .... Once Daily 4)  Oxycodone Hcl 5 Mg Tabs (Oxycodone Hcl) .... Take One Tablet Every 6 Hrs As Needed Pain 5)  Calcium  Carbonate 1250 Mg Tabs (Calcium Carbonate) .... One Tablet Daily 6)  Cellcept 500 Mg Tabs (Mycophenolate Mofetil) .... Take One Tablet Two Times A Day 7)  Crestor 10 Mg Tabs (Rosuvastatin Calcium) .... Take One Tablet Once Daily 8)  Cymbalta 60 Mg Cpep (Duloxetine Hcl) .... Take One Once Daily 9)  Prednisone 5 Mg Tabs (Prednisone) .... Take One Tablet Once Daily 10)  Ferrous Sulfate 325 (65 Fe) Mg Tabs (Ferrous Sulfate) .... Take One Tablet Once Daily 11)  Folic Acid 800 Mcg Tabs (Folic Acid) .... Take One Tablet Once Daily 12)  Flomax 0.4 Mg Caps (Tamsulosin Hcl) .... Take One Capsule Once Daily 13)  Januvia 100 Mg Tabs (Sitagliptin Phosphate) .... Take One Tablet Once Daily 14)  Magnesium Oxide 400 Mg Tabs (Magnesium Oxide) .... Two Times A Day 15)  Multivitamins  Tabs (Multiple Vitamin) .... Once Daily 16)  Prilosec 20 Mg Cpdr (Omeprazole) .... Once Daily 17)  Prograf 5 Mg Caps (Tacrolimus) .... Take One Tablet By Mouth Two Times A Day 18)  Restoril 15 Mg Caps (Temazepam) .... Take One Capsule Daily At Bedtime 19)  Vitamin B-12 100 Mcg Tabs (Cyanocobalamin) .... Take One Tablet By Mouth Once Daily 20)  Vitamin D3 10000 Unit Caps (Cholecalciferol) .... Take One Capsule Twice A Week 21)  Zetia 10 Mg Tabs (Ezetimibe) .... Take One  Tablet Once Daily 22)  Novolog 100 Unit/ml Soln (Insulin Aspart) .... Sliding Scale With Insulin Pump. 23)  Carbidopa-Levodopa 10-100 Mg Tabs (Carbidopa-Levodopa) .... Take One Tablet Three Times A Day 24)  Pyridostigmine Bromide 60 Mg Tabs (Pyridostigmine Bromide) .... One  Tablet Two Times A Day 25)  Lisinopril 2.5 Mg Tabs (Lisinopril) .... One Daily  Allergies (verified): No Known Drug Allergies  Past History:  Past Medical History: Reviewed history from 06/24/2009 and no changes required. 1. Diabetes mellitus 2. CKD s/p renal transplant 2002 3. HTN 4. PAD: Followed by Dr. Edilia Bo 5. Lumbar disc disease with low back pain 6. Diabetic gastroparesis 7.  Prostate cancer s/p seed implantation in 1/06.  8. Erectile dysfunction 9. Tremor 10.  Obesity 11.  Diastolic CHF: Last echo (4/11) with inferior and septal hypokinesis, moderate LV hypertrophy, EF 45-50%, mild left atrial enlargement.  12.  CAD: Cath (2/11) with 60% distal left main, 90% ostial LAD involving D1, 95% ostial large ramus, probable significant ostial CFX stenosis, 90% PDA, 70% PLV.  Paitent had CABG with LIMA-LAD, SVG-D, sequential SVG-ramus and OM2, SVG-PLV.   13.  Atrial fibrillation post-op CABG 14.  Orthostatic hypotension: Some improvement with pyridostigmine.   Past Surgical History: CABG 2/11 Cataract extraction Vitrectomy Tonsillectomy Kidney transplant Hernia surgery Prior Peritoneal catheter placement  Family History: Reviewed history from 05/26/2009 and no changes required. Mother-deceased age 62, CAD Father-deceased, valvular heart disease 1 brother and 3 sisters all alive, one with hepatits. One sister with CAD/MI  Social History: Reviewed history from 05/26/2009 and no changes required. 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  Nonsmoker-smoked 1ppd for 25 years, stopped 1980 No alcohol No illicit drugs  Review of Systems       The patient complains of fatigue.  The patient denies malaise, fever, weight gain/loss, vision loss, decreased hearing, hoarseness, chest pain, palpitations, shortness of breath, prolonged cough, wheezing, sleep apnea, coughing up blood, abdominal pain, blood in stool, nausea, vomiting, diarrhea, heartburn, incontinence, blood in urine, muscle weakness, joint pain, leg swelling, rash, skin lesions, headache, fainting, dizziness, depression, anxiety, enlarged lymph nodes, easy bruising or bleeding, and environmental allergies.         Righ calf pain with ambulation.   Vital Signs:  Patient profile:   70 year old male Height:      73 inches Weight:      219.8 pounds BMI:     29.10 Pulse rate:   79 /  minute Pulse rhythm:   irregular BP sitting:   130 / 64  (left arm) Cuff size:   large  Vitals Entered By: Danielle Rankin, CMA (November 30, 2009 9:32 AM)  Physical Exam  General:  General: Well developed, well nourished, NAD HEENT: OP clear, mucus membranes moist SKIN: warm, dry Neuro: No focal deficits Musculoskeletal: Muscle strength 5/5 all ext Psychiatric: Mood and affect normal Neck: No JVD, no carotid bruits, no thyromegaly, no lymphadenopathy. Lungs:Clear bilaterally, no wheezes, rhonci, crackles CV: RRR no murmurs, gallops rubs Abdomen: soft, NT, ND, BS present Extremities: Trace bilateral lower ext  edema, pedal pulses nonpalpable bilaterally.     Arterial Doppler  Procedure date:  11/16/2009  Findings:      Right ABI 0.81, Left ABI 0.76. Right CFA waveform monophasic. Focal stenosis proximal right SFA.   EKG  Procedure date:  11/30/2009  Findings:      Sinus rhythm with 1st degree AV block. LAFB.   Impression & Recommendations:  Problem # 1:  PVD WITH CLAUDICATION (  ICD-56.89) John [name] has worsened claudication in his right calf. His non-invasive studies suggest at least moderate disease in both lower extremities with the possibility of iliac inflow disease. Angiogram from 2003 by Dr. Edilia Bo of the left LE only with moderate disease in the iliacs and SFA with occluded left ATA and there was one vessel runoff to the left foot by the posterior tibial. The right lower extremity was not evaluated secondary to risk of contrast nephropathy.   I think a repeat angiogram is indicated at this time. I have discussed risks and benefits with the pt and his wife. He would like to proceed. Will plan for December 29, 2009 at [facility] lab. We will check BMET, CBC and coags the week of procedure.   Other Orders: EKG w/ Interpretation (93000) PV Procedure (PV Procedure)  Patient Instructions: 1)  Your physician recommends that you schedule a follow-up appointment in:  6 weeks. 2)  Your physician recommends that you continue on your current medications as directed. Please refer to the Current Medication list given to you today. 3)  Your physician has requested that you have a peripheral vascular angiogram on Oct.9th, 2011 @ 8:30am. Please be there at 6:30 am. This exam is performed at the hospital. During this exam IV contrast is used to look at arterial blood flow.  Please review the information sheet given for details.  4)  Your physician recommends that you return for lab work on November 7th, 2011 at anytime except between 1:30-2:30pm. BMP/CBC/PT/443.9  Appended Document: eval for  right SFA stenosis with symptoms Whitney, John [name] had his lower ext runoff today. He has severe disease in his right SFA and right popliteal. I did not intervene today because of his renal insufficiency and transplanted kidney. He needs repeat BMET on Friday 12/31/09. (Lots of elevens...) Can we arrange this and speak to him about setting up his intervention (Right SFA atherectomy and right popliteal atherectomy for December 7th? thanks, cdm  Appended Document: eval for  right SFA stenosis with symptoms LVM for pt. to call back.   Appended Document: eval for  right SFA stenosis with symptoms pt sch. for 11/23 with Dr. Clifton James. pt. aware & letter sent. He will come for blood work 11/16

## 2010-03-24 NOTE — Letter (Signed)
Summary: [facility] Office Note  [facility] Office Note   Imported By: Roderic Ovens 06/04/2009 16:04:49  _____________________________________________________________________  External Attachment:    Type:   Image     Comment:   External Document

## 2010-03-24 NOTE — Progress Notes (Signed)
Summary: appt with Dr Shirlee Latch  Phone Note Outgoing Call   Call placed by: Katina Dung, RN, BSN,  February 24, 2010 6:15 PM Call placed to: Patient  Follow-up for Phone Call        I talked with pt--he thought he had an appt with Dr Shirlee Latch today and was told the appt had been cancelled--pt is complaining of lack of energy for about 1 month--I have made pt an appt with Dr Shirlee Latch for 03/02/10-

## 2010-03-24 NOTE — Miscellaneous (Signed)
Summary: [facility]  [facility]   Imported By: Roderic Ovens 07/09/2009 10:48:57  _____________________________________________________________________  External Attachment:    Type:   Image     Comment:   External Document

## 2010-03-24 NOTE — Miscellaneous (Signed)
Summary: [facility]   [facility]   Imported By: Roderic Ovens 01/25/2010 12:35:05  _____________________________________________________________________  External Attachment:    Type:   Image     Comment:   External Document  Appended Document: eph/wpa EKG today with NSR, PAC, LAFB.

## 2010-03-24 NOTE — Progress Notes (Signed)
Summary: B/P   Phone Note Other Incoming   Caller: [facility] Summary of Call: B/P  Follow-up for Phone Call        received call from Amy at [facility] [phone]--on Amiodarone 200mg  twice a day and Lasix 40mg  twice a day---pt B/P today 132/68 and 80/50 standing  and after sitting for 5 minutes B/P 150/70-Amy states pt well hydrated,does complain of SOB, lungs are clear ,has some edema, has not been weighing-he is in the process of getting scales, he has an appt with Dr [name] 05-20-09 and Dr Shirlee Latch 05-26-09-reviewed with Dr McLean--pt to decrease Amiodarone to 200mg  daily and will hold PM dose of Lasix and Dr [name] to see pt 05-20-09 and reassess pt 05-20-09

## 2010-03-24 NOTE — Miscellaneous (Signed)
Summary: [facility]   [facility]   Imported By: Roderic Ovens 09/03/2009 11:08:14  _____________________________________________________________________  External Attachment:    Type:   Image     Comment:   External Document

## 2010-03-24 NOTE — Miscellaneous (Signed)
Summary: [facility] Orders  [facility] Orders   Imported By: Roderic Ovens 06/21/2009 12:06:19  _____________________________________________________________________  External Attachment:    Type:   Image     Comment:   External Document

## 2010-03-24 NOTE — Progress Notes (Signed)
Summary: order to continue nursing/ [facility]  Phone Note From Other Clinic   Caller: amy from [facility] [phone]  Request: Talk with Nurse Summary of Call: need an order to continue nursing.  pt was under dr. Laneta Simmers , but was  discharged. pt still having issues with b.p.. Initial call taken by: Lorne Skeens,  May 24, 2009 4:49 PM  Follow-up for Phone Call        Called Amy back at [facility].. she will continue to see patient 1 time per week for 3 weeks. Advised her ok to have HH orders under Dr.Renate Danh since he is still having issues with BP and does not see surgeon anymore. He will see Dr.Dennies Coate this week in the office for follow up. Follow-up by: Suzan Garibaldi RN

## 2010-03-24 NOTE — Miscellaneous (Signed)
Summary: Cardiac Rehab Report  Cardiac Rehab Report   Imported By: Erle Crocker 08/05/2009 07:51:02  _____________________________________________________________________  External Attachment:    Type:   Image     Comment:   External Document

## 2010-03-24 NOTE — Letter (Signed)
Summary: Peripheral Vascular  [facility], Main Office  [address]   [place], [place] [address]   Phone: [phone]  Fax: [phone]     11/30/2009 MRN: [id number]  John [name] [address] [place], [place]  [address]  Dear [name],   You are scheduled for Peripheral Vascular Angiogram on Nov.9th, 2011              with Dr. Clifton James.  Please arrive at the [facility] at 6:30      a.m. on the day of your procedure.  1. DIET     __X__ Nothing to eat or drink after midnight except your medications with a sip of water.  2. Come to the [place] office on  Nov.7th, 2011 for lab work.  The lab at [facility] is open from 8:30 a.m. to 1:30 p.m. and 2:30 p.m. to 5:00 p.m.  The lab at [facility] is open from 7:30 a.m. to 5:30 p.m.  You do not have to be fasting.  3. MAKE SURE YOU TAKE YOUR ASPIRIN.      __X__ YOU MAY TAKE ALL of your remaining medications with a small amount of water.         ___X_ Pre-med instructions:      Please dose your insulin pump according to your blood sugar. You may want to only take 1/2 of your PM Insulin dose.  4. Plan for one night stay - bring personal belongings (i.e. toothpaste, toothbrush, etc.)  5. Bring a current list of your medications and current insurance cards.  6. Must have a responsible person to drive you home.   7. Someone must be with yu for the first 24 hours after you arrive home.  8. Please wear clothes that are easy to get on and off and wear slip-on shoes.  *Special note: Every effort is made to have your procedure done on time.  Occasionally there are emergencies that present themselves at the hospital that may cause delays.  Please be patient if a delay does occur.  If you have any questions after you get home, please call the office at the number listed above.   Whitney Maeola Sarah RN

## 2010-03-24 NOTE — Progress Notes (Signed)
Summary: list of medications  Phone Note Other Incoming   Caller: pt Summary of Call: list of medications Call placed by: Katina Dung, RN, BSN,  August 03, 2009 2:21 PM  Follow-up for Phone Call        pt here to pick up a list of medications--I have given him a list of his current medications       Patient Instructions: 1)  List of Medications

## 2010-03-24 NOTE — Consult Note (Signed)
Summary: [facility]  [facility]   Imported By: Marylou Mccoy 05/26/2009 08:56:30  _____________________________________________________________________  External Attachment:    Type:   Image     Comment:   External Document

## 2010-03-24 NOTE — Miscellaneous (Signed)
Summary: Orders Update  Clinical Lists Changes  Problems: Added new problem of LEG PAIN, RIGHT (ICD-729.5) Orders: Added new Test order of Arterial Duplex Lower Extremity (Arterial Duplex Low) - Signed 

## 2010-03-24 NOTE — Letter (Signed)
Summary: [facility] Progress Note    [facility] Office Progress Note   Imported By: Roderic Ovens 11/26/2009 13:42:44  _____________________________________________________________________  External Attachment:    Type:   Image     Comment:   External Document

## 2010-03-24 NOTE — Letter (Signed)
Summary: [facility]  [facility]   Imported By: Lester [place] 01/11/2010 09:02:17  _____________________________________________________________________  External Attachment:    Type:   Image     Comment:   External Document

## 2010-03-24 NOTE — Miscellaneous (Signed)
Summary: [facility]   [facility]   Imported By: Roderic Ovens 11/01/2009 11:48:27  _____________________________________________________________________  External Attachment:    Type:   Image     Comment:   External Document

## 2010-03-24 NOTE — Letter (Signed)
Summary: [facility] Progress Note  [facility] Progress Note   Imported By: Roderic Ovens 07/30/2009 11:55:47  _____________________________________________________________________  External Attachment:    Type:   Image     Comment:   External Document

## 2010-03-24 NOTE — Letter (Signed)
Summary: [facility] Office Note  [facility] Office Note   Imported By: Marylou Mccoy 11/25/2009 14:26:50  _____________________________________________________________________  External Attachment:    Type:   Image     Comment:   External Document

## 2010-04-13 ENCOUNTER — Encounter: Payer: Self-pay | Admitting: Cardiology

## 2010-05-03 ENCOUNTER — Encounter (INDEPENDENT_AMBULATORY_CARE_PROVIDER_SITE_OTHER): Payer: Medicare Other

## 2010-05-03 ENCOUNTER — Encounter: Payer: Self-pay | Admitting: Cardiovascular Disease

## 2010-05-03 DIAGNOSIS — I739 Peripheral vascular disease, unspecified: Secondary | ICD-10-CM

## 2010-05-03 LAB — BASIC METABOLIC PANEL
BUN: 12 mg/dL (ref 6–23)
CO2: 28 mEq/L (ref 19–32)
CO2: 30 mEq/L (ref 19–32)
Calcium: 9.1 mg/dL (ref 8.4–10.5)
Calcium: 9.1 mg/dL (ref 8.4–10.5)
Chloride: 104 mEq/L (ref 96–112)
Creatinine, Ser: 0.89 mg/dL (ref 0.4–1.5)
Creatinine, Ser: 0.9 mg/dL (ref 0.4–1.5)
GFR calc Af Amer: >60 mL/min (ref >60)
Glucose, Bld: 122 mg/dL — ABNORMAL HIGH (ref 70–99)
Sodium: 139 mEq/L (ref 135–145)

## 2010-05-03 LAB — CBC
HCT: 38.2 % — ABNORMAL LOW (ref 39.0–52.0)
Hemoglobin: 11.7 g/dL — ABNORMAL LOW (ref 13.0–17.0)
Hemoglobin: 11.8 g/dL — ABNORMAL LOW (ref 13.0–17.0)
MCH: 23.2 pg — ABNORMAL LOW (ref 26.0–34.0)
MCH: 23.5 pg — ABNORMAL LOW (ref 26.0–34.0)
MCHC: 30.6 g/dL (ref 30.0–36.0)
MCHC: 30.9 g/dL (ref 30.0–36.0)
MCV: 75.8 fL — ABNORMAL LOW (ref 78.0–100.0)
Platelets: 152 10*3/uL (ref 150–400)
Platelets: 154 10*3/uL (ref 150–400)
RBC: 4.79 MIL/uL (ref 4.22–5.81)
RBC: 4.98 MIL/uL (ref 4.22–5.81)
RDW: 17.4 % — ABNORMAL HIGH (ref 11.5–15.5)
RDW: 17.4 % — ABNORMAL HIGH (ref 11.5–15.5)
WBC: 8.3 10*3/uL (ref 4.0–10.5)
WBC: 8.3 10*3/uL (ref 4.0–10.5)

## 2010-05-03 LAB — GLUCOSE, CAPILLARY
Glucose-Capillary: 101 mg/dL — ABNORMAL HIGH (ref 70–99)
Glucose-Capillary: 113 mg/dL — ABNORMAL HIGH (ref 70–99)
Glucose-Capillary: 114 mg/dL — ABNORMAL HIGH (ref 70–99)
Glucose-Capillary: 139 mg/dL — ABNORMAL HIGH (ref 70–99)
Glucose-Capillary: 155 mg/dL — ABNORMAL HIGH (ref 70–99)
Glucose-Capillary: 156 mg/dL — ABNORMAL HIGH (ref 70–99)
Glucose-Capillary: 171 mg/dL — ABNORMAL HIGH (ref 70–99)
Glucose-Capillary: 182 mg/dL — ABNORMAL HIGH (ref 70–99)
Glucose-Capillary: 197 mg/dL — ABNORMAL HIGH (ref 70–99)
Glucose-Capillary: 200 mg/dL — ABNORMAL HIGH (ref 70–99)
Glucose-Capillary: 267 mg/dL — ABNORMAL HIGH (ref 70–99)

## 2010-05-06 LAB — GLUCOSE, CAPILLARY: Glucose-Capillary: 64 mg/dL — ABNORMAL LOW (ref 70–99)

## 2010-05-08 LAB — DIFFERENTIAL
Basophils Absolute: 0 10*3/uL (ref 0.0–0.1)
Basophils Relative: 0 % (ref 0–1)
Eosinophils Absolute: 0.2 10*3/uL (ref 0.0–0.7)
Lymphocytes Relative: 9 % — ABNORMAL LOW (ref 12–46)
Lymphs Abs: 0.9 10*3/uL (ref 0.7–4.0)
Monocytes Absolute: 1.1 10*3/uL — ABNORMAL HIGH (ref 0.1–1.0)
Monocytes Relative: 6 % (ref 3–12)
Neutro Abs: 7.7 10*3/uL (ref 1.7–7.7)
Neutro Abs: 8.5 10*3/uL — ABNORMAL HIGH (ref 1.7–7.7)
Neutrophils Relative %: 73 % (ref 43–77)
Neutrophils Relative %: 83 % — ABNORMAL HIGH (ref 43–77)

## 2010-05-08 LAB — POCT I-STAT 3, ART BLOOD GAS (G3+)
O2 Saturation: 89 %
pO2, Arterial: 52 mmHg — ABNORMAL LOW (ref 80.0–100.0)

## 2010-05-08 LAB — CBC
HCT: 30.6 % — ABNORMAL LOW (ref 39.0–52.0)
HCT: 31.3 % — ABNORMAL LOW (ref 39.0–52.0)
HCT: 31.8 % — ABNORMAL LOW (ref 39.0–52.0)
Hemoglobin: 10.3 g/dL — ABNORMAL LOW (ref 13.0–17.0)
Hemoglobin: 10.5 g/dL — ABNORMAL LOW (ref 13.0–17.0)
Hemoglobin: 11.5 g/dL — ABNORMAL LOW (ref 13.0–17.0)
MCHC: 34 g/dL (ref 30.0–36.0)
MCV: 78.1 fL (ref 78.0–100.0)
MCV: 80.6 fL (ref 78.0–100.0)
Platelets: 104 10*3/uL — ABNORMAL LOW (ref 150–400)
RBC: 3.95 MIL/uL — ABNORMAL LOW (ref 4.22–5.81)
RBC: 3.96 MIL/uL — ABNORMAL LOW (ref 4.22–5.81)
RBC: 4.33 MIL/uL (ref 4.22–5.81)
RDW: 15.9 % — ABNORMAL HIGH (ref 11.5–15.5)
RDW: 16.3 % — ABNORMAL HIGH (ref 11.5–15.5)
WBC: 10.6 10*3/uL — ABNORMAL HIGH (ref 4.0–10.5)
WBC: 7.7 10*3/uL (ref 4.0–10.5)
WBC: 8 10*3/uL (ref 4.0–10.5)

## 2010-05-08 LAB — COMPREHENSIVE METABOLIC PANEL
AST: 19 U/L (ref 0–37)
Albumin: 2.9 g/dL — ABNORMAL LOW (ref 3.5–5.2)
Albumin: 3 g/dL — ABNORMAL LOW (ref 3.5–5.2)
Alkaline Phosphatase: 56 U/L (ref 39–117)
Alkaline Phosphatase: 60 U/L (ref 39–117)
BUN: 31 mg/dL — ABNORMAL HIGH (ref 6–23)
BUN: 36 mg/dL — ABNORMAL HIGH (ref 6–23)
CO2: 23 mEq/L (ref 19–32)
Calcium: 9.4 mg/dL (ref 8.4–10.5)
Chloride: 105 mEq/L (ref 96–112)
Chloride: 106 mEq/L (ref 96–112)
Creatinine, Ser: 1.49 mg/dL (ref 0.4–1.5)
GFR calc Af Amer: >60 mL/min (ref >60)
GFR calc non Af Amer: 47 mL/min — ABNORMAL LOW (ref >60)
Glucose, Bld: 104 mg/dL — ABNORMAL HIGH (ref 70–99)
Glucose, Bld: 268 mg/dL — ABNORMAL HIGH (ref 70–99)
Potassium: 4.6 mEq/L (ref 3.5–5.1)
Potassium: 4.7 mEq/L (ref 3.5–5.1)
Sodium: 136 mEq/L (ref 135–145)
Total Bilirubin: 0.8 mg/dL (ref 0.3–1.2)
Total Bilirubin: 1.9 mg/dL — ABNORMAL HIGH (ref 0.3–1.2)
Total Protein: 6.3 g/dL (ref 6.0–8.3)
Total Protein: 7.1 g/dL (ref 6.0–8.3)

## 2010-05-08 LAB — CARDIAC PANEL(CRET KIN+CKTOT+MB+TROPI)
CK, MB: 5.2 ng/mL — ABNORMAL HIGH (ref 0.3–4.0)
CK, MB: 5.5 ng/mL — ABNORMAL HIGH (ref 0.3–4.0)
CK, MB: 5.9 ng/mL — ABNORMAL HIGH (ref 0.3–4.0)
Relative Index: 1.5 (ref 0.0–2.5)
Relative Index: 1.6 (ref 0.0–2.5)
Relative Index: 2.6 — ABNORMAL HIGH (ref 0.0–2.5)
Total CK: 229 U/L (ref 7–232)
Total CK: 347 U/L — ABNORMAL HIGH (ref 7–232)
Total CK: 441 U/L — ABNORMAL HIGH (ref 7–232)
Troponin I: 1.01 ng/mL (ref 0.00–0.06)
Troponin I: 1.29 ng/mL (ref 0.00–0.06)
Troponin I: 1.56 ng/mL (ref 0.00–0.06)

## 2010-05-08 LAB — GLUCOSE, CAPILLARY
Glucose-Capillary: 155 mg/dL — ABNORMAL HIGH (ref 70–99)
Glucose-Capillary: 188 mg/dL — ABNORMAL HIGH (ref 70–99)
Glucose-Capillary: 173 mg/dL — ABNORMAL HIGH (ref 70–99)
Glucose-Capillary: 181 mg/dL — ABNORMAL HIGH (ref 70–99)
Glucose-Capillary: 190 mg/dL — ABNORMAL HIGH (ref 70–99)
Glucose-Capillary: 217 mg/dL — ABNORMAL HIGH (ref 70–99)
Glucose-Capillary: 220 mg/dL — ABNORMAL HIGH (ref 70–99)
Glucose-Capillary: 225 mg/dL — ABNORMAL HIGH (ref 70–99)
Glucose-Capillary: 249 mg/dL — ABNORMAL HIGH (ref 70–99)
Glucose-Capillary: 253 mg/dL — ABNORMAL HIGH (ref 70–99)
Glucose-Capillary: 265 mg/dL — ABNORMAL HIGH (ref 70–99)
Glucose-Capillary: 273 mg/dL — ABNORMAL HIGH (ref 70–99)
Glucose-Capillary: 113 mg/dL — ABNORMAL HIGH (ref 70–99)

## 2010-05-08 LAB — BASIC METABOLIC PANEL
BUN: 21 mg/dL (ref 6–23)
Chloride: 105 mEq/L (ref 96–112)
GFR calc non Af Amer: 57 mL/min — ABNORMAL LOW (ref >60)
Potassium: 4.4 mEq/L (ref 3.5–5.1)
Sodium: 137 mEq/L (ref 135–145)

## 2010-05-08 LAB — LIPID PANEL
HDL: 40 mg/dL (ref >39)
Total CHOL/HDL Ratio: 3 RATIO
VLDL: 21 mg/dL (ref 0–40)

## 2010-05-08 LAB — BRAIN NATRIURETIC PEPTIDE: Pro B Natriuretic peptide (BNP): 93 pg/mL (ref 0.0–100.0)

## 2010-05-08 LAB — LACTIC ACID, PLASMA: Lactic Acid, Venous: 2 mmol/L (ref 0.5–2.2)

## 2010-05-08 LAB — BLOOD GAS, ARTERIAL
Bicarbonate: 22.5 mEq/L (ref 20.0–24.0)
O2 Saturation: 88.2 %
Patient temperature: 99.1
TCO2: 23.7 mmol/L (ref 0–100)
pH, Arterial: 7.371 (ref 7.350–7.450)

## 2010-05-08 LAB — URINALYSIS, ROUTINE W REFLEX MICROSCOPIC
Glucose, UA: NEGATIVE mg/dL
Ketones, ur: 15 mg/dL — AB
pH: 5.5 (ref 5.0–8.0)

## 2010-05-08 LAB — URINE MICROSCOPIC-ADD ON

## 2010-05-08 LAB — MRSA PCR SCREENING: MRSA by PCR: NEGATIVE

## 2010-05-08 LAB — CULTURE, BLOOD (ROUTINE X 2): Culture: NO GROWTH

## 2010-05-08 LAB — HEPARIN LEVEL (UNFRACTIONATED): Heparin Unfractionated: 0.1 IU/mL — ABNORMAL LOW (ref 0.30–0.70)

## 2010-05-08 LAB — D-DIMER, QUANTITATIVE: D-Dimer, Quant: 2.85 ug/mL-FEU — ABNORMAL HIGH (ref 0.00–0.48)

## 2010-05-09 LAB — GLUCOSE, CAPILLARY
Glucose-Capillary: 131 mg/dL — ABNORMAL HIGH (ref 70–99)
Glucose-Capillary: 112 mg/dL — ABNORMAL HIGH (ref 70–99)
Glucose-Capillary: 117 mg/dL — ABNORMAL HIGH (ref 70–99)
Glucose-Capillary: 132 mg/dL — ABNORMAL HIGH (ref 70–99)

## 2010-05-10 LAB — GLUCOSE, CAPILLARY
Glucose-Capillary: 117 mg/dL — ABNORMAL HIGH (ref 70–99)
Glucose-Capillary: 190 mg/dL — ABNORMAL HIGH (ref 70–99)

## 2010-05-11 LAB — BASIC METABOLIC PANEL
BUN: 19 mg/dL (ref 6–23)
BUN: 20 mg/dL (ref 6–23)
BUN: 24 mg/dL — ABNORMAL HIGH (ref 6–23)
BUN: 25 mg/dL — ABNORMAL HIGH (ref 6–23)
BUN: 27 mg/dL — ABNORMAL HIGH (ref 6–23)
BUN: 39 mg/dL — ABNORMAL HIGH (ref 6–23)
BUN: 46 mg/dL — ABNORMAL HIGH (ref 6–23)
BUN: 50 mg/dL — ABNORMAL HIGH (ref 6–23)
BUN: 51 mg/dL — ABNORMAL HIGH (ref 6–23)
BUN: 52 mg/dL — ABNORMAL HIGH (ref 6–23)
BUN: 53 mg/dL — ABNORMAL HIGH (ref 6–23)
BUN: 53 mg/dL — ABNORMAL HIGH (ref 6–23)
BUN: 54 mg/dL — ABNORMAL HIGH (ref 6–23)
BUN: 56 mg/dL — ABNORMAL HIGH (ref 6–23)
CO2: 23 mEq/L (ref 19–32)
CO2: 25 mEq/L (ref 19–32)
CO2: 26 mEq/L (ref 19–32)
CO2: 27 mEq/L (ref 19–32)
CO2: 27 mEq/L (ref 19–32)
CO2: 27 mEq/L (ref 19–32)
CO2: 28 mEq/L (ref 19–32)
CO2: 28 mEq/L (ref 19–32)
CO2: 28 mEq/L (ref 19–32)
CO2: 29 mEq/L (ref 19–32)
CO2: 31 mEq/L (ref 19–32)
CO2: 36 mEq/L — ABNORMAL HIGH (ref 19–32)
Calcium: 8.7 mg/dL (ref 8.4–10.5)
Calcium: 8.8 mg/dL (ref 8.4–10.5)
Calcium: 8.8 mg/dL (ref 8.4–10.5)
Calcium: 8.9 mg/dL (ref 8.4–10.5)
Calcium: 8.9 mg/dL (ref 8.4–10.5)
Calcium: 9 mg/dL (ref 8.4–10.5)
Calcium: 9.1 mg/dL (ref 8.4–10.5)
Calcium: 9.2 mg/dL (ref 8.4–10.5)
Calcium: 9.3 mg/dL (ref 8.4–10.5)
Calcium: 9.3 mg/dL (ref 8.4–10.5)
Calcium: 9.6 mg/dL (ref 8.4–10.5)
Chloride: 100 mEq/L (ref 96–112)
Chloride: 100 mEq/L (ref 96–112)
Chloride: 101 mEq/L (ref 96–112)
Chloride: 101 mEq/L (ref 96–112)
Chloride: 102 mEq/L (ref 96–112)
Chloride: 103 mEq/L (ref 96–112)
Chloride: 105 mEq/L (ref 96–112)
Chloride: 105 mEq/L (ref 96–112)
Chloride: 111 mEq/L (ref 96–112)
Chloride: 95 mEq/L — ABNORMAL LOW (ref 96–112)
Chloride: 98 mEq/L (ref 96–112)
Chloride: 99 mEq/L (ref 96–112)
Chloride: 99 mEq/L (ref 96–112)
Creatinine, Ser: 1.43 mg/dL (ref 0.4–1.5)
Creatinine, Ser: 1.51 mg/dL — ABNORMAL HIGH (ref 0.4–1.5)
Creatinine, Ser: 1.52 mg/dL — ABNORMAL HIGH (ref 0.4–1.5)
Creatinine, Ser: 1.55 mg/dL — ABNORMAL HIGH (ref 0.4–1.5)
Creatinine, Ser: 1.6 mg/dL — ABNORMAL HIGH (ref 0.4–1.5)
Creatinine, Ser: 1.62 mg/dL — ABNORMAL HIGH (ref 0.4–1.5)
Creatinine, Ser: 1.81 mg/dL — ABNORMAL HIGH (ref 0.4–1.5)
Creatinine, Ser: 1.94 mg/dL — ABNORMAL HIGH (ref 0.4–1.5)
Creatinine, Ser: 2.03 mg/dL — ABNORMAL HIGH (ref 0.4–1.5)
Creatinine, Ser: 2.2 mg/dL — ABNORMAL HIGH (ref 0.4–1.5)
Creatinine, Ser: 2.3 mg/dL — ABNORMAL HIGH (ref 0.4–1.5)
Creatinine, Ser: 2.31 mg/dL — ABNORMAL HIGH (ref 0.4–1.5)
Creatinine, Ser: 3.06 mg/dL — ABNORMAL HIGH (ref 0.4–1.5)
GFR calc Af Amer: 25 mL/min — ABNORMAL LOW (ref >60)
GFR calc Af Amer: 34 mL/min — ABNORMAL LOW (ref >60)
GFR calc Af Amer: 40 mL/min — ABNORMAL LOW (ref >60)
GFR calc Af Amer: 42 mL/min — ABNORMAL LOW (ref >60)
GFR calc Af Amer: 45 mL/min — ABNORMAL LOW (ref >60)
GFR calc Af Amer: 50 mL/min — ABNORMAL LOW (ref >60)
GFR calc Af Amer: 51 mL/min — ABNORMAL LOW (ref >60)
GFR calc Af Amer: 52 mL/min — ABNORMAL LOW (ref >60)
GFR calc Af Amer: 54 mL/min — ABNORMAL LOW (ref >60)
GFR calc Af Amer: 55 mL/min — ABNORMAL LOW (ref >60)
GFR calc non Af Amer: 20 mL/min — ABNORMAL LOW (ref >60)
GFR calc non Af Amer: 30 mL/min — ABNORMAL LOW (ref >60)
GFR calc non Af Amer: 33 mL/min — ABNORMAL LOW (ref >60)
GFR calc non Af Amer: 34 mL/min — ABNORMAL LOW (ref >60)
GFR calc non Af Amer: 38 mL/min — ABNORMAL LOW (ref >60)
GFR calc non Af Amer: 40 mL/min — ABNORMAL LOW (ref >60)
GFR calc non Af Amer: 42 mL/min — ABNORMAL LOW (ref >60)
GFR calc non Af Amer: 45 mL/min — ABNORMAL LOW (ref >60)
GFR calc non Af Amer: 46 mL/min — ABNORMAL LOW (ref >60)
GFR calc non Af Amer: 52 mL/min — ABNORMAL LOW (ref >60)
Glucose, Bld: 115 mg/dL — ABNORMAL HIGH (ref 70–99)
Glucose, Bld: 116 mg/dL — ABNORMAL HIGH (ref 70–99)
Glucose, Bld: 126 mg/dL — ABNORMAL HIGH (ref 70–99)
Glucose, Bld: 137 mg/dL — ABNORMAL HIGH (ref 70–99)
Glucose, Bld: 139 mg/dL — ABNORMAL HIGH (ref 70–99)
Glucose, Bld: 172 mg/dL — ABNORMAL HIGH (ref 70–99)
Glucose, Bld: 186 mg/dL — ABNORMAL HIGH (ref 70–99)
Glucose, Bld: 191 mg/dL — ABNORMAL HIGH (ref 70–99)
Glucose, Bld: 193 mg/dL — ABNORMAL HIGH (ref 70–99)
Glucose, Bld: 204 mg/dL — ABNORMAL HIGH (ref 70–99)
Glucose, Bld: 206 mg/dL — ABNORMAL HIGH (ref 70–99)
Glucose, Bld: 275 mg/dL — ABNORMAL HIGH (ref 70–99)
Glucose, Bld: 294 mg/dL — ABNORMAL HIGH (ref 70–99)
Glucose, Bld: 59 mg/dL — ABNORMAL LOW (ref 70–99)
Potassium: 3.8 mEq/L (ref 3.5–5.1)
Potassium: 3.9 mEq/L (ref 3.5–5.1)
Potassium: 4.1 mEq/L (ref 3.5–5.1)
Potassium: 4.2 mEq/L (ref 3.5–5.1)
Potassium: 4.2 mEq/L (ref 3.5–5.1)
Potassium: 4.2 mEq/L (ref 3.5–5.1)
Potassium: 4.3 mEq/L (ref 3.5–5.1)
Potassium: 4.4 mEq/L (ref 3.5–5.1)
Potassium: 4.5 mEq/L (ref 3.5–5.1)
Potassium: 4.6 mEq/L (ref 3.5–5.1)
Potassium: 4.6 mEq/L (ref 3.5–5.1)
Potassium: 5.1 mEq/L (ref 3.5–5.1)
Potassium: 5.2 mEq/L — ABNORMAL HIGH (ref 3.5–5.1)
Sodium: 131 mEq/L — ABNORMAL LOW (ref 135–145)
Sodium: 133 mEq/L — ABNORMAL LOW (ref 135–145)
Sodium: 135 mEq/L (ref 135–145)
Sodium: 135 mEq/L (ref 135–145)
Sodium: 135 mEq/L (ref 135–145)
Sodium: 135 mEq/L (ref 135–145)
Sodium: 138 mEq/L (ref 135–145)
Sodium: 138 mEq/L (ref 135–145)
Sodium: 139 mEq/L (ref 135–145)
Sodium: 140 mEq/L (ref 135–145)
Sodium: 142 mEq/L (ref 135–145)

## 2010-05-11 LAB — GLUCOSE, CAPILLARY
Glucose-Capillary: 103 mg/dL — ABNORMAL HIGH (ref 70–99)
Glucose-Capillary: 104 mg/dL — ABNORMAL HIGH (ref 70–99)
Glucose-Capillary: 107 mg/dL — ABNORMAL HIGH (ref 70–99)
Glucose-Capillary: 107 mg/dL — ABNORMAL HIGH (ref 70–99)
Glucose-Capillary: 111 mg/dL — ABNORMAL HIGH (ref 70–99)
Glucose-Capillary: 112 mg/dL — ABNORMAL HIGH (ref 70–99)
Glucose-Capillary: 115 mg/dL — ABNORMAL HIGH (ref 70–99)
Glucose-Capillary: 116 mg/dL — ABNORMAL HIGH (ref 70–99)
Glucose-Capillary: 118 mg/dL — ABNORMAL HIGH (ref 70–99)
Glucose-Capillary: 124 mg/dL — ABNORMAL HIGH (ref 70–99)
Glucose-Capillary: 126 mg/dL — ABNORMAL HIGH (ref 70–99)
Glucose-Capillary: 132 mg/dL — ABNORMAL HIGH (ref 70–99)
Glucose-Capillary: 133 mg/dL — ABNORMAL HIGH (ref 70–99)
Glucose-Capillary: 133 mg/dL — ABNORMAL HIGH (ref 70–99)
Glucose-Capillary: 138 mg/dL — ABNORMAL HIGH (ref 70–99)
Glucose-Capillary: 139 mg/dL — ABNORMAL HIGH (ref 70–99)
Glucose-Capillary: 140 mg/dL — ABNORMAL HIGH (ref 70–99)
Glucose-Capillary: 140 mg/dL — ABNORMAL HIGH (ref 70–99)
Glucose-Capillary: 143 mg/dL — ABNORMAL HIGH (ref 70–99)
Glucose-Capillary: 146 mg/dL — ABNORMAL HIGH (ref 70–99)
Glucose-Capillary: 146 mg/dL — ABNORMAL HIGH (ref 70–99)
Glucose-Capillary: 147 mg/dL — ABNORMAL HIGH (ref 70–99)
Glucose-Capillary: 147 mg/dL — ABNORMAL HIGH (ref 70–99)
Glucose-Capillary: 148 mg/dL — ABNORMAL HIGH (ref 70–99)
Glucose-Capillary: 149 mg/dL — ABNORMAL HIGH (ref 70–99)
Glucose-Capillary: 149 mg/dL — ABNORMAL HIGH (ref 70–99)
Glucose-Capillary: 149 mg/dL — ABNORMAL HIGH (ref 70–99)
Glucose-Capillary: 149 mg/dL — ABNORMAL HIGH (ref 70–99)
Glucose-Capillary: 152 mg/dL — ABNORMAL HIGH (ref 70–99)
Glucose-Capillary: 158 mg/dL — ABNORMAL HIGH (ref 70–99)
Glucose-Capillary: 162 mg/dL — ABNORMAL HIGH (ref 70–99)
Glucose-Capillary: 164 mg/dL — ABNORMAL HIGH (ref 70–99)
Glucose-Capillary: 168 mg/dL — ABNORMAL HIGH (ref 70–99)
Glucose-Capillary: 169 mg/dL — ABNORMAL HIGH (ref 70–99)
Glucose-Capillary: 170 mg/dL — ABNORMAL HIGH (ref 70–99)
Glucose-Capillary: 171 mg/dL — ABNORMAL HIGH (ref 70–99)
Glucose-Capillary: 172 mg/dL — ABNORMAL HIGH (ref 70–99)
Glucose-Capillary: 173 mg/dL — ABNORMAL HIGH (ref 70–99)
Glucose-Capillary: 173 mg/dL — ABNORMAL HIGH (ref 70–99)
Glucose-Capillary: 174 mg/dL — ABNORMAL HIGH (ref 70–99)
Glucose-Capillary: 177 mg/dL — ABNORMAL HIGH (ref 70–99)
Glucose-Capillary: 177 mg/dL — ABNORMAL HIGH (ref 70–99)
Glucose-Capillary: 180 mg/dL — ABNORMAL HIGH (ref 70–99)
Glucose-Capillary: 182 mg/dL — ABNORMAL HIGH (ref 70–99)
Glucose-Capillary: 185 mg/dL — ABNORMAL HIGH (ref 70–99)
Glucose-Capillary: 185 mg/dL — ABNORMAL HIGH (ref 70–99)
Glucose-Capillary: 186 mg/dL — ABNORMAL HIGH (ref 70–99)
Glucose-Capillary: 186 mg/dL — ABNORMAL HIGH (ref 70–99)
Glucose-Capillary: 187 mg/dL — ABNORMAL HIGH (ref 70–99)
Glucose-Capillary: 188 mg/dL — ABNORMAL HIGH (ref 70–99)
Glucose-Capillary: 189 mg/dL — ABNORMAL HIGH (ref 70–99)
Glucose-Capillary: 190 mg/dL — ABNORMAL HIGH (ref 70–99)
Glucose-Capillary: 190 mg/dL — ABNORMAL HIGH (ref 70–99)
Glucose-Capillary: 193 mg/dL — ABNORMAL HIGH (ref 70–99)
Glucose-Capillary: 193 mg/dL — ABNORMAL HIGH (ref 70–99)
Glucose-Capillary: 195 mg/dL — ABNORMAL HIGH (ref 70–99)
Glucose-Capillary: 197 mg/dL — ABNORMAL HIGH (ref 70–99)
Glucose-Capillary: 197 mg/dL — ABNORMAL HIGH (ref 70–99)
Glucose-Capillary: 198 mg/dL — ABNORMAL HIGH (ref 70–99)
Glucose-Capillary: 202 mg/dL — ABNORMAL HIGH (ref 70–99)
Glucose-Capillary: 214 mg/dL — ABNORMAL HIGH (ref 70–99)
Glucose-Capillary: 220 mg/dL — ABNORMAL HIGH (ref 70–99)
Glucose-Capillary: 221 mg/dL — ABNORMAL HIGH (ref 70–99)
Glucose-Capillary: 227 mg/dL — ABNORMAL HIGH (ref 70–99)
Glucose-Capillary: 228 mg/dL — ABNORMAL HIGH (ref 70–99)
Glucose-Capillary: 229 mg/dL — ABNORMAL HIGH (ref 70–99)
Glucose-Capillary: 230 mg/dL — ABNORMAL HIGH (ref 70–99)
Glucose-Capillary: 248 mg/dL — ABNORMAL HIGH (ref 70–99)
Glucose-Capillary: 251 mg/dL — ABNORMAL HIGH (ref 70–99)
Glucose-Capillary: 254 mg/dL — ABNORMAL HIGH (ref 70–99)
Glucose-Capillary: 271 mg/dL — ABNORMAL HIGH (ref 70–99)
Glucose-Capillary: 286 mg/dL — ABNORMAL HIGH (ref 70–99)
Glucose-Capillary: 324 mg/dL — ABNORMAL HIGH (ref 70–99)
Glucose-Capillary: 326 mg/dL — ABNORMAL HIGH (ref 70–99)
Glucose-Capillary: 354 mg/dL — ABNORMAL HIGH (ref 70–99)
Glucose-Capillary: 60 mg/dL — ABNORMAL LOW (ref 70–99)
Glucose-Capillary: 65 mg/dL — ABNORMAL LOW (ref 70–99)
Glucose-Capillary: 72 mg/dL (ref 70–99)
Glucose-Capillary: 74 mg/dL (ref 70–99)
Glucose-Capillary: 77 mg/dL (ref 70–99)
Glucose-Capillary: 82 mg/dL (ref 70–99)
Glucose-Capillary: 87 mg/dL (ref 70–99)
Glucose-Capillary: 87 mg/dL (ref 70–99)
Glucose-Capillary: 93 mg/dL (ref 70–99)
Glucose-Capillary: 94 mg/dL (ref 70–99)
Glucose-Capillary: 94 mg/dL (ref 70–99)
Glucose-Capillary: 96 mg/dL (ref 70–99)
Glucose-Capillary: 98 mg/dL (ref 70–99)
Glucose-Capillary: 99 mg/dL (ref 70–99)

## 2010-05-11 LAB — CROSSMATCH
ABO/RH(D): O POS
ABO/RH(D): O POS
Antibody Screen: NEGATIVE
Antibody Screen: NEGATIVE

## 2010-05-11 LAB — CULTURE, RESPIRATORY W GRAM STAIN: Culture: NORMAL

## 2010-05-11 LAB — BRAIN NATRIURETIC PEPTIDE
Pro B Natriuretic peptide (BNP): 452 pg/mL — ABNORMAL HIGH (ref 0.0–100.0)
Pro B Natriuretic peptide (BNP): 618 pg/mL — ABNORMAL HIGH (ref 0.0–100.0)
Pro B Natriuretic peptide (BNP): 719 pg/mL — ABNORMAL HIGH (ref 0.0–100.0)

## 2010-05-11 LAB — PROTIME-INR
INR: 1.09 (ref 0.00–1.49)
INR: 1.1 (ref 0.00–1.49)
INR: 1.2 (ref 0.00–1.49)
INR: 1.27 (ref 0.00–1.49)
Prothrombin Time: 15.8 seconds — ABNORMAL HIGH (ref 11.6–15.2)

## 2010-05-11 LAB — TACROLIMUS, BLOOD
Tacrolimus Lvl: 8.5 ng/mL (ref 5.0–20.0)
Tacrolimus Lvl: 8.8 ng/mL (ref 5.0–20.0)

## 2010-05-11 LAB — POCT I-STAT 4, (NA,K, GLUC, HGB,HCT)
Glucose, Bld: 125 mg/dL — ABNORMAL HIGH (ref 70–99)
Glucose, Bld: 156 mg/dL — ABNORMAL HIGH (ref 70–99)
Glucose, Bld: 166 mg/dL — ABNORMAL HIGH (ref 70–99)
Glucose, Bld: 171 mg/dL — ABNORMAL HIGH (ref 70–99)
HCT: 22 % — ABNORMAL LOW (ref 39.0–52.0)
HCT: 26 % — ABNORMAL LOW (ref 39.0–52.0)
Hemoglobin: 7.5 g/dL — ABNORMAL LOW (ref 13.0–17.0)
Hemoglobin: 8.8 g/dL — ABNORMAL LOW (ref 13.0–17.0)
Hemoglobin: 8.8 g/dL — ABNORMAL LOW (ref 13.0–17.0)
Hemoglobin: 9.5 g/dL — ABNORMAL LOW (ref 13.0–17.0)
Potassium: 4.4 mEq/L (ref 3.5–5.1)
Potassium: 4.6 mEq/L (ref 3.5–5.1)
Potassium: 4.8 mEq/L (ref 3.5–5.1)
Potassium: 4.8 mEq/L (ref 3.5–5.1)
Sodium: 134 mEq/L — ABNORMAL LOW (ref 135–145)
Sodium: 136 mEq/L (ref 135–145)
Sodium: 137 mEq/L (ref 135–145)
Sodium: 139 mEq/L (ref 135–145)

## 2010-05-11 LAB — COMPREHENSIVE METABOLIC PANEL
ALT: 15 U/L (ref 0–53)
ALT: 19 U/L (ref 0–53)
AST: 12 U/L (ref 0–37)
Albumin: 2.4 g/dL — ABNORMAL LOW (ref 3.5–5.2)
Albumin: 2.6 g/dL — ABNORMAL LOW (ref 3.5–5.2)
Albumin: 2.7 g/dL — ABNORMAL LOW (ref 3.5–5.2)
Alkaline Phosphatase: 70 U/L (ref 39–117)
Alkaline Phosphatase: 71 U/L (ref 39–117)
BUN: 24 mg/dL — ABNORMAL HIGH (ref 6–23)
BUN: 24 mg/dL — ABNORMAL HIGH (ref 6–23)
BUN: 30 mg/dL — ABNORMAL HIGH (ref 6–23)
BUN: 44 mg/dL — ABNORMAL HIGH (ref 6–23)
BUN: 56 mg/dL — ABNORMAL HIGH (ref 6–23)
CO2: 27 mEq/L (ref 19–32)
CO2: 27 mEq/L (ref 19–32)
CO2: 35 mEq/L — ABNORMAL HIGH (ref 19–32)
Calcium: 8.5 mg/dL (ref 8.4–10.5)
Calcium: 8.7 mg/dL (ref 8.4–10.5)
Calcium: 8.8 mg/dL (ref 8.4–10.5)
Chloride: 100 mEq/L (ref 96–112)
Chloride: 96 mEq/L (ref 96–112)
Creatinine, Ser: 1.6 mg/dL — ABNORMAL HIGH (ref 0.4–1.5)
Creatinine, Ser: 1.66 mg/dL — ABNORMAL HIGH (ref 0.4–1.5)
Creatinine, Ser: 1.75 mg/dL — ABNORMAL HIGH (ref 0.4–1.5)
Creatinine, Ser: 2.21 mg/dL — ABNORMAL HIGH (ref 0.4–1.5)
GFR calc Af Amer: 36 mL/min — ABNORMAL LOW (ref >60)
GFR calc non Af Amer: 30 mL/min — ABNORMAL LOW (ref >60)
GFR calc non Af Amer: 38 mL/min — ABNORMAL LOW (ref >60)
GFR calc non Af Amer: 43 mL/min — ABNORMAL LOW (ref >60)
Glucose, Bld: 226 mg/dL — ABNORMAL HIGH (ref 70–99)
Glucose, Bld: 346 mg/dL — ABNORMAL HIGH (ref 70–99)
Potassium: 3.8 mEq/L (ref 3.5–5.1)
Potassium: 4.7 mEq/L (ref 3.5–5.1)
Sodium: 133 mEq/L — ABNORMAL LOW (ref 135–145)
Sodium: 137 mEq/L (ref 135–145)
Total Bilirubin: 0.9 mg/dL (ref 0.3–1.2)
Total Bilirubin: 1.1 mg/dL (ref 0.3–1.2)
Total Bilirubin: 1.2 mg/dL (ref 0.3–1.2)
Total Protein: 6 g/dL (ref 6.0–8.3)
Total Protein: 6.4 g/dL (ref 6.0–8.3)
Total Protein: 6.4 g/dL (ref 6.0–8.3)

## 2010-05-11 LAB — POCT I-STAT 3, ART BLOOD GAS (G3+)
Acid-Base Excess: 1 mmol/L (ref 0.0–2.0)
Acid-Base Excess: 10 mmol/L — ABNORMAL HIGH (ref 0.0–2.0)
Acid-Base Excess: 5 mmol/L — ABNORMAL HIGH (ref 0.0–2.0)
Acid-Base Excess: 6 mmol/L — ABNORMAL HIGH (ref 0.0–2.0)
Acid-Base Excess: 7 mmol/L — ABNORMAL HIGH (ref 0.0–2.0)
Acid-base deficit: 5 mmol/L — ABNORMAL HIGH (ref 0.0–2.0)
Bicarbonate: 19.6 mEq/L — ABNORMAL LOW (ref 20.0–24.0)
Bicarbonate: 24.3 mEq/L — ABNORMAL HIGH (ref 20.0–24.0)
Bicarbonate: 24.5 mEq/L — ABNORMAL HIGH (ref 20.0–24.0)
Bicarbonate: 26.7 mEq/L — ABNORMAL HIGH (ref 20.0–24.0)
Bicarbonate: 28.7 mEq/L — ABNORMAL HIGH (ref 20.0–24.0)
Bicarbonate: 28.8 mEq/L — ABNORMAL HIGH (ref 20.0–24.0)
Bicarbonate: 29.6 mEq/L — ABNORMAL HIGH (ref 20.0–24.0)
Bicarbonate: 34.9 mEq/L — ABNORMAL HIGH (ref 20.0–24.0)
Bicarbonate: 36.1 mEq/L — ABNORMAL HIGH (ref 20.0–24.0)
O2 Saturation: 100 %
O2 Saturation: 83 %
O2 Saturation: 92 %
O2 Saturation: 94 %
O2 Saturation: 96 %
O2 Saturation: 97 %
O2 Saturation: 97 %
O2 Saturation: 99 %
Patient temperature: 36.3
Patient temperature: 38
Patient temperature: 96.2
Patient temperature: 98.2
TCO2: 21 mmol/L (ref 0–100)
TCO2: 25 mmol/L (ref 0–100)
TCO2: 28 mmol/L (ref 0–100)
TCO2: 36 mmol/L (ref 0–100)
TCO2: 38 mmol/L (ref 0–100)
pCO2 arterial: 33.5 mmHg — ABNORMAL LOW (ref 35.0–45.0)
pCO2 arterial: 35.5 mmHg (ref 35.0–45.0)
pCO2 arterial: 36 mmHg (ref 35.0–45.0)
pCO2 arterial: 36.3 mmHg (ref 35.0–45.0)
pCO2 arterial: 37.7 mmHg (ref 35.0–45.0)
pCO2 arterial: 41 mmHg (ref 35.0–45.0)
pCO2 arterial: 49.3 mmHg — ABNORMAL HIGH (ref 35.0–45.0)
pH, Arterial: 7.316 — ABNORMAL LOW (ref 7.350–7.450)
pH, Arterial: 7.414 (ref 7.350–7.450)
pH, Arterial: 7.467 — ABNORMAL HIGH (ref 7.350–7.450)
pH, Arterial: 7.51 — ABNORMAL HIGH (ref 7.350–7.450)
pH, Arterial: 7.535 — ABNORMAL HIGH (ref 7.350–7.450)
pO2, Arterial: 132 mmHg — ABNORMAL HIGH (ref 80.0–100.0)
pO2, Arterial: 304 mmHg — ABNORMAL HIGH (ref 80.0–100.0)
pO2, Arterial: 71 mmHg — ABNORMAL LOW (ref 80.0–100.0)
pO2, Arterial: 73 mmHg — ABNORMAL LOW (ref 80.0–100.0)
pO2, Arterial: 74 mmHg — ABNORMAL LOW (ref 80.0–100.0)
pO2, Arterial: 82 mmHg (ref 80.0–100.0)
pO2, Arterial: 83 mmHg (ref 80.0–100.0)
pO2, Arterial: 94 mmHg (ref 80.0–100.0)
pO2, Arterial: 99 mmHg (ref 80.0–100.0)

## 2010-05-11 LAB — CBC
HCT: 23.6 % — ABNORMAL LOW (ref 39.0–52.0)
HCT: 23.7 % — ABNORMAL LOW (ref 39.0–52.0)
HCT: 23.9 % — ABNORMAL LOW (ref 39.0–52.0)
HCT: 24.4 % — ABNORMAL LOW (ref 39.0–52.0)
HCT: 24.5 % — ABNORMAL LOW (ref 39.0–52.0)
HCT: 24.8 % — ABNORMAL LOW (ref 39.0–52.0)
HCT: 25.1 % — ABNORMAL LOW (ref 39.0–52.0)
HCT: 25.3 % — ABNORMAL LOW (ref 39.0–52.0)
HCT: 25.5 % — ABNORMAL LOW (ref 39.0–52.0)
HCT: 25.7 % — ABNORMAL LOW (ref 39.0–52.0)
HCT: 26.1 % — ABNORMAL LOW (ref 39.0–52.0)
HCT: 26.3 % — ABNORMAL LOW (ref 39.0–52.0)
HCT: 26.8 % — ABNORMAL LOW (ref 39.0–52.0)
HCT: 27.4 % — ABNORMAL LOW (ref 39.0–52.0)
HCT: 27.7 % — ABNORMAL LOW (ref 39.0–52.0)
HCT: 27.8 % — ABNORMAL LOW (ref 39.0–52.0)
HCT: 28.2 % — ABNORMAL LOW (ref 39.0–52.0)
HCT: 28.4 % — ABNORMAL LOW (ref 39.0–52.0)
HCT: 28.5 % — ABNORMAL LOW (ref 39.0–52.0)
HCT: 30.3 % — ABNORMAL LOW (ref 39.0–52.0)
Hemoglobin: 7.6 g/dL — ABNORMAL LOW (ref 13.0–17.0)
Hemoglobin: 7.9 g/dL — ABNORMAL LOW (ref 13.0–17.0)
Hemoglobin: 7.9 g/dL — ABNORMAL LOW (ref 13.0–17.0)
Hemoglobin: 8 g/dL — ABNORMAL LOW (ref 13.0–17.0)
Hemoglobin: 8.2 g/dL — ABNORMAL LOW (ref 13.0–17.0)
Hemoglobin: 8.5 g/dL — ABNORMAL LOW (ref 13.0–17.0)
Hemoglobin: 8.6 g/dL — ABNORMAL LOW (ref 13.0–17.0)
Hemoglobin: 8.6 g/dL — ABNORMAL LOW (ref 13.0–17.0)
Hemoglobin: 8.6 g/dL — ABNORMAL LOW (ref 13.0–17.0)
Hemoglobin: 8.6 g/dL — ABNORMAL LOW (ref 13.0–17.0)
Hemoglobin: 8.6 g/dL — ABNORMAL LOW (ref 13.0–17.0)
Hemoglobin: 8.7 g/dL — ABNORMAL LOW (ref 13.0–17.0)
Hemoglobin: 8.8 g/dL — ABNORMAL LOW (ref 13.0–17.0)
Hemoglobin: 9 g/dL — ABNORMAL LOW (ref 13.0–17.0)
Hemoglobin: 9.4 g/dL — ABNORMAL LOW (ref 13.0–17.0)
Hemoglobin: 9.4 g/dL — ABNORMAL LOW (ref 13.0–17.0)
Hemoglobin: 9.5 g/dL — ABNORMAL LOW (ref 13.0–17.0)
MCHC: 32 g/dL (ref 30.0–36.0)
MCHC: 32.6 g/dL (ref 30.0–36.0)
MCHC: 32.6 g/dL (ref 30.0–36.0)
MCHC: 32.7 g/dL (ref 30.0–36.0)
MCHC: 32.8 g/dL (ref 30.0–36.0)
MCHC: 32.9 g/dL (ref 30.0–36.0)
MCHC: 32.9 g/dL (ref 30.0–36.0)
MCHC: 32.9 g/dL (ref 30.0–36.0)
MCHC: 32.9 g/dL (ref 30.0–36.0)
MCHC: 33 g/dL (ref 30.0–36.0)
MCHC: 33.2 g/dL (ref 30.0–36.0)
MCHC: 33.3 g/dL (ref 30.0–36.0)
MCHC: 33.3 g/dL (ref 30.0–36.0)
MCHC: 33.3 g/dL (ref 30.0–36.0)
MCHC: 33.3 g/dL (ref 30.0–36.0)
MCHC: 33.4 g/dL (ref 30.0–36.0)
MCHC: 33.5 g/dL (ref 30.0–36.0)
MCHC: 33.5 g/dL (ref 30.0–36.0)
MCHC: 33.6 g/dL (ref 30.0–36.0)
MCHC: 33.7 g/dL (ref 30.0–36.0)
MCHC: 33.7 g/dL (ref 30.0–36.0)
MCHC: 33.9 g/dL (ref 30.0–36.0)
MCV: 78.2 fL (ref 78.0–100.0)
MCV: 78.3 fL (ref 78.0–100.0)
MCV: 79.1 fL (ref 78.0–100.0)
MCV: 79.4 fL (ref 78.0–100.0)
MCV: 79.5 fL (ref 78.0–100.0)
MCV: 79.7 fL (ref 78.0–100.0)
MCV: 79.9 fL (ref 78.0–100.0)
MCV: 80 fL (ref 78.0–100.0)
MCV: 80 fL (ref 78.0–100.0)
MCV: 80.6 fL (ref 78.0–100.0)
MCV: 80.8 fL (ref 78.0–100.0)
MCV: 80.8 fL (ref 78.0–100.0)
MCV: 81 fL (ref 78.0–100.0)
MCV: 81.1 fL (ref 78.0–100.0)
MCV: 81.3 fL (ref 78.0–100.0)
MCV: 81.4 fL (ref 78.0–100.0)
MCV: 81.6 fL (ref 78.0–100.0)
MCV: 81.7 fL (ref 78.0–100.0)
MCV: 81.8 fL (ref 78.0–100.0)
MCV: 82.2 fL (ref 78.0–100.0)
Platelets: 105 10*3/uL — ABNORMAL LOW (ref 150–400)
Platelets: 138 10*3/uL — ABNORMAL LOW (ref 150–400)
Platelets: 142 10*3/uL — ABNORMAL LOW (ref 150–400)
Platelets: 157 10*3/uL (ref 150–400)
Platelets: 164 10*3/uL (ref 150–400)
Platelets: 166 10*3/uL (ref 150–400)
Platelets: 185 10*3/uL (ref 150–400)
Platelets: 192 10*3/uL (ref 150–400)
Platelets: 198 10*3/uL (ref 150–400)
Platelets: 201 10*3/uL (ref 150–400)
Platelets: 217 10*3/uL (ref 150–400)
Platelets: 222 10*3/uL (ref 150–400)
Platelets: 222 10*3/uL (ref 150–400)
Platelets: 225 10*3/uL (ref 150–400)
Platelets: 229 10*3/uL (ref 150–400)
Platelets: 233 10*3/uL (ref 150–400)
Platelets: 234 10*3/uL (ref 150–400)
Platelets: 262 10*3/uL (ref 150–400)
Platelets: 265 10*3/uL (ref 150–400)
Platelets: 267 10*3/uL (ref 150–400)
Platelets: 276 10*3/uL (ref 150–400)
Platelets: 276 10*3/uL (ref 150–400)
RBC: 2.88 MIL/uL — ABNORMAL LOW (ref 4.22–5.81)
RBC: 2.92 MIL/uL — ABNORMAL LOW (ref 4.22–5.81)
RBC: 2.93 MIL/uL — ABNORMAL LOW (ref 4.22–5.81)
RBC: 2.98 MIL/uL — ABNORMAL LOW (ref 4.22–5.81)
RBC: 3.05 MIL/uL — ABNORMAL LOW (ref 4.22–5.81)
RBC: 3.16 MIL/uL — ABNORMAL LOW (ref 4.22–5.81)
RBC: 3.17 MIL/uL — ABNORMAL LOW (ref 4.22–5.81)
RBC: 3.19 MIL/uL — ABNORMAL LOW (ref 4.22–5.81)
RBC: 3.21 MIL/uL — ABNORMAL LOW (ref 4.22–5.81)
RBC: 3.22 MIL/uL — ABNORMAL LOW (ref 4.22–5.81)
RBC: 3.3 MIL/uL — ABNORMAL LOW (ref 4.22–5.81)
RBC: 3.31 MIL/uL — ABNORMAL LOW (ref 4.22–5.81)
RBC: 3.32 MIL/uL — ABNORMAL LOW (ref 4.22–5.81)
RBC: 3.4 MIL/uL — ABNORMAL LOW (ref 4.22–5.81)
RBC: 3.44 MIL/uL — ABNORMAL LOW (ref 4.22–5.81)
RBC: 3.48 MIL/uL — ABNORMAL LOW (ref 4.22–5.81)
RBC: 3.49 MIL/uL — ABNORMAL LOW (ref 4.22–5.81)
RBC: 3.5 MIL/uL — ABNORMAL LOW (ref 4.22–5.81)
RDW: 15.8 % — ABNORMAL HIGH (ref 11.5–15.5)
RDW: 15.8 % — ABNORMAL HIGH (ref 11.5–15.5)
RDW: 15.9 % — ABNORMAL HIGH (ref 11.5–15.5)
RDW: 15.9 % — ABNORMAL HIGH (ref 11.5–15.5)
RDW: 16.1 % — ABNORMAL HIGH (ref 11.5–15.5)
RDW: 16.2 % — ABNORMAL HIGH (ref 11.5–15.5)
RDW: 16.6 % — ABNORMAL HIGH (ref 11.5–15.5)
RDW: 16.9 % — ABNORMAL HIGH (ref 11.5–15.5)
RDW: 16.9 % — ABNORMAL HIGH (ref 11.5–15.5)
RDW: 17.2 % — ABNORMAL HIGH (ref 11.5–15.5)
RDW: 18.3 % — ABNORMAL HIGH (ref 11.5–15.5)
RDW: 18.6 % — ABNORMAL HIGH (ref 11.5–15.5)
RDW: 18.7 % — ABNORMAL HIGH (ref 11.5–15.5)
RDW: 18.8 % — ABNORMAL HIGH (ref 11.5–15.5)
RDW: 18.9 % — ABNORMAL HIGH (ref 11.5–15.5)
RDW: 18.9 % — ABNORMAL HIGH (ref 11.5–15.5)
RDW: 19 % — ABNORMAL HIGH (ref 11.5–15.5)
RDW: 19.1 % — ABNORMAL HIGH (ref 11.5–15.5)
RDW: 19.3 % — ABNORMAL HIGH (ref 11.5–15.5)
RDW: 19.4 % — ABNORMAL HIGH (ref 11.5–15.5)
RDW: 19.6 % — ABNORMAL HIGH (ref 11.5–15.5)
RDW: 19.7 % — ABNORMAL HIGH (ref 11.5–15.5)
RDW: 19.7 % — ABNORMAL HIGH (ref 11.5–15.5)
RDW: 20.3 % — ABNORMAL HIGH (ref 11.5–15.5)
WBC: 10.8 10*3/uL — ABNORMAL HIGH (ref 4.0–10.5)
WBC: 10.8 10*3/uL — ABNORMAL HIGH (ref 4.0–10.5)
WBC: 11.8 10*3/uL — ABNORMAL HIGH (ref 4.0–10.5)
WBC: 11.9 10*3/uL — ABNORMAL HIGH (ref 4.0–10.5)
WBC: 12 10*3/uL — ABNORMAL HIGH (ref 4.0–10.5)
WBC: 12.8 10*3/uL — ABNORMAL HIGH (ref 4.0–10.5)
WBC: 13 10*3/uL — ABNORMAL HIGH (ref 4.0–10.5)
WBC: 13.2 10*3/uL — ABNORMAL HIGH (ref 4.0–10.5)
WBC: 13.5 10*3/uL — ABNORMAL HIGH (ref 4.0–10.5)
WBC: 13.7 10*3/uL — ABNORMAL HIGH (ref 4.0–10.5)
WBC: 14.9 10*3/uL — ABNORMAL HIGH (ref 4.0–10.5)
WBC: 16.8 10*3/uL — ABNORMAL HIGH (ref 4.0–10.5)
WBC: 7.8 10*3/uL (ref 4.0–10.5)
WBC: 8.4 10*3/uL (ref 4.0–10.5)
WBC: 8.6 10*3/uL (ref 4.0–10.5)
WBC: 9.1 10*3/uL (ref 4.0–10.5)

## 2010-05-11 LAB — TYPE AND SCREEN
ABO/RH(D): O POS
Antibody Screen: NEGATIVE

## 2010-05-11 LAB — RENAL FUNCTION PANEL
Albumin: 2.2 g/dL — ABNORMAL LOW (ref 3.5–5.2)
Albumin: 2.2 g/dL — ABNORMAL LOW (ref 3.5–5.2)
Albumin: 2.5 g/dL — ABNORMAL LOW (ref 3.5–5.2)
Albumin: 2.7 g/dL — ABNORMAL LOW (ref 3.5–5.2)
BUN: 19 mg/dL (ref 6–23)
BUN: 21 mg/dL (ref 6–23)
BUN: 26 mg/dL — ABNORMAL HIGH (ref 6–23)
BUN: 52 mg/dL — ABNORMAL HIGH (ref 6–23)
CO2: 25 mEq/L (ref 19–32)
CO2: 26 mEq/L (ref 19–32)
CO2: 28 mEq/L (ref 19–32)
CO2: 30 mEq/L (ref 19–32)
CO2: 33 mEq/L — ABNORMAL HIGH (ref 19–32)
Calcium: 8.3 mg/dL — ABNORMAL LOW (ref 8.4–10.5)
Calcium: 8.7 mg/dL (ref 8.4–10.5)
Calcium: 8.8 mg/dL (ref 8.4–10.5)
Calcium: 9.2 mg/dL (ref 8.4–10.5)
Calcium: 9.5 mg/dL (ref 8.4–10.5)
Calcium: 9.5 mg/dL (ref 8.4–10.5)
Chloride: 103 mEq/L (ref 96–112)
Chloride: 97 mEq/L (ref 96–112)
Creatinine, Ser: 1.34 mg/dL (ref 0.4–1.5)
Creatinine, Ser: 1.53 mg/dL — ABNORMAL HIGH (ref 0.4–1.5)
Creatinine, Ser: 1.56 mg/dL — ABNORMAL HIGH (ref 0.4–1.5)
Creatinine, Ser: 1.81 mg/dL — ABNORMAL HIGH (ref 0.4–1.5)
Creatinine, Ser: 2.4 mg/dL — ABNORMAL HIGH (ref 0.4–1.5)
GFR calc Af Amer: 33 mL/min — ABNORMAL LOW (ref >60)
GFR calc Af Amer: 52 mL/min — ABNORMAL LOW (ref >60)
GFR calc Af Amer: 54 mL/min — ABNORMAL LOW (ref >60)
GFR calc Af Amer: 55 mL/min — ABNORMAL LOW (ref >60)
GFR calc Af Amer: >60 mL/min (ref >60)
GFR calc non Af Amer: 27 mL/min — ABNORMAL LOW (ref >60)
GFR calc non Af Amer: 45 mL/min — ABNORMAL LOW (ref >60)
GFR calc non Af Amer: 53 mL/min — ABNORMAL LOW (ref >60)
Glucose, Bld: 142 mg/dL — ABNORMAL HIGH (ref 70–99)
Glucose, Bld: 149 mg/dL — ABNORMAL HIGH (ref 70–99)
Glucose, Bld: 75 mg/dL (ref 70–99)
Glucose, Bld: 76 mg/dL (ref 70–99)
Glucose, Bld: 89 mg/dL (ref 70–99)
Phosphorus: 2.2 mg/dL — ABNORMAL LOW (ref 2.3–4.6)
Phosphorus: 2.7 mg/dL (ref 2.3–4.6)
Phosphorus: 2.9 mg/dL (ref 2.3–4.6)
Phosphorus: 4.2 mg/dL (ref 2.3–4.6)
Potassium: 3.4 mEq/L — ABNORMAL LOW (ref 3.5–5.1)
Potassium: 3.5 mEq/L (ref 3.5–5.1)
Potassium: 4.3 mEq/L (ref 3.5–5.1)
Sodium: 134 mEq/L — ABNORMAL LOW (ref 135–145)
Sodium: 137 mEq/L (ref 135–145)
Sodium: 138 mEq/L (ref 135–145)
Sodium: 141 mEq/L (ref 135–145)

## 2010-05-11 LAB — URINE MICROSCOPIC-ADD ON

## 2010-05-11 LAB — PREPARE PLATELETS

## 2010-05-11 LAB — BLOOD GAS, ARTERIAL
Acid-Base Excess: 5.2 mmol/L — ABNORMAL HIGH (ref 0.0–2.0)
Acid-base deficit: 0.8 mmol/L (ref 0.0–2.0)
Bicarbonate: 23.3 mEq/L (ref 20.0–24.0)
FIO2: 0.21 %
O2 Content: 4 L/min
O2 Saturation: 86.2 %
O2 Saturation: 98.7 %
Patient temperature: 98.6
Patient temperature: 98.6

## 2010-05-11 LAB — RETICULOCYTES
RBC.: 3.7 MIL/uL — ABNORMAL LOW (ref 4.22–5.81)
Retic Count, Absolute: 40.7 10*3/uL (ref 19.0–186.0)
Retic Ct Pct: 1.1 % (ref 0.4–3.1)

## 2010-05-11 LAB — LIPID PANEL
Cholesterol: 126 mg/dL (ref 0–200)
HDL: 47 mg/dL (ref >39)
Triglycerides: 91 mg/dL (ref <150)

## 2010-05-11 LAB — URINALYSIS, ROUTINE W REFLEX MICROSCOPIC
Bilirubin Urine: NEGATIVE
Glucose, UA: NEGATIVE mg/dL
Glucose, UA: NEGATIVE mg/dL
Hgb urine dipstick: NEGATIVE
Ketones, ur: NEGATIVE mg/dL
Ketones, ur: NEGATIVE mg/dL
Nitrite: NEGATIVE
Protein, ur: 100 mg/dL — AB
Protein, ur: NEGATIVE mg/dL
Specific Gravity, Urine: 1.013 (ref 1.005–1.030)
Specific Gravity, Urine: 1.024 (ref 1.005–1.030)
Urobilinogen, UA: 0.2 mg/dL (ref 0.0–1.0)
Urobilinogen, UA: 0.2 mg/dL (ref 0.0–1.0)
pH: 5 (ref 5.0–8.0)

## 2010-05-11 LAB — MAGNESIUM
Magnesium: 1.8 mg/dL (ref 1.5–2.5)
Magnesium: 2 mg/dL (ref 1.5–2.5)
Magnesium: 2.3 mg/dL (ref 1.5–2.5)
Magnesium: 2.8 mg/dL — ABNORMAL HIGH (ref 1.5–2.5)
Magnesium: 2.9 mg/dL — ABNORMAL HIGH (ref 1.5–2.5)

## 2010-05-11 LAB — PHOSPHORUS
Phosphorus: 1 mg/dL — CL (ref 2.3–4.6)
Phosphorus: 2 mg/dL — ABNORMAL LOW (ref 2.3–4.6)
Phosphorus: 3.8 mg/dL (ref 2.3–4.6)

## 2010-05-11 LAB — CULTURE, BLOOD (ROUTINE X 2)
Culture: NO GROWTH
Culture: NO GROWTH
Culture: NO GROWTH

## 2010-05-11 LAB — IRON AND TIBC
Iron: 19 ug/dL — ABNORMAL LOW (ref 42–135)
Saturation Ratios: 14 % — ABNORMAL LOW (ref 20–55)
UIBC: 120 ug/dL

## 2010-05-11 LAB — URINE CULTURE
Colony Count: NO GROWTH
Culture: NO GROWTH

## 2010-05-11 LAB — POCT I-STAT, CHEM 8
BUN: 25 mg/dL — ABNORMAL HIGH (ref 6–23)
Calcium, Ion: 1.17 mmol/L (ref 1.12–1.32)
Chloride: 108 mEq/L (ref 96–112)
Glucose, Bld: 173 mg/dL — ABNORMAL HIGH (ref 70–99)

## 2010-05-11 LAB — FERRITIN: Ferritin: 407 ng/mL — ABNORMAL HIGH (ref 22–322)

## 2010-05-11 LAB — APTT
aPTT: 36 seconds (ref 24–37)
aPTT: 41 seconds — ABNORMAL HIGH (ref 24–37)
aPTT: 53 seconds — ABNORMAL HIGH (ref 24–37)

## 2010-05-11 LAB — CLOSTRIDIUM DIFFICILE EIA: C difficile Toxins A+B, EIA: NEGATIVE

## 2010-05-11 LAB — VANCOMYCIN, RANDOM
Vancomycin Rm: 28.9 ug/mL
Vancomycin Rm: 35.3 ug/mL

## 2010-05-11 LAB — HEPARIN LEVEL (UNFRACTIONATED): Heparin Unfractionated: <0.1 IU/mL — ABNORMAL LOW (ref 0.30–0.70)

## 2010-05-11 LAB — DIFFERENTIAL
Basophils Relative: 0 % (ref 0–1)
Eosinophils Absolute: 0.4 10*3/uL (ref 0.0–0.7)
Eosinophils Relative: 4 % (ref 0–5)
Lymphs Abs: 1.7 10*3/uL (ref 0.7–4.0)
Neutrophils Relative %: 68 % (ref 43–77)

## 2010-05-11 LAB — MRSA PCR SCREENING
MRSA by PCR: NEGATIVE
MRSA by PCR: NEGATIVE

## 2010-05-11 LAB — HEMOGLOBIN A1C
Hgb A1c MFr Bld: 6.6 % — ABNORMAL HIGH (ref 4.6–6.1)
Mean Plasma Glucose: 143 mg/dL

## 2010-05-11 LAB — CORTISOL: Cortisol, Plasma: 11.8 ug/dL

## 2010-05-11 LAB — ABO/RH: ABO/RH(D): O POS

## 2010-05-11 LAB — SEDIMENTATION RATE: Sed Rate: 100 mm/hr — ABNORMAL HIGH (ref 0–16)

## 2010-05-13 LAB — CBC
HCT: 31 % — ABNORMAL LOW (ref 39.0–52.0)
HCT: 33.2 % — ABNORMAL LOW (ref 39.0–52.0)
HCT: 34 % — ABNORMAL LOW (ref 39.0–52.0)
HCT: 37.5 % — ABNORMAL LOW (ref 39.0–52.0)
HCT: 38 % — ABNORMAL LOW (ref 39.0–52.0)
HCT: 39.5 % (ref 39.0–52.0)
HCT: 41.2 % (ref 39.0–52.0)
Hemoglobin: 10 g/dL — ABNORMAL LOW (ref 13.0–17.0)
Hemoglobin: 10.3 g/dL — ABNORMAL LOW (ref 13.0–17.0)
Hemoglobin: 10.8 g/dL — ABNORMAL LOW (ref 13.0–17.0)
Hemoglobin: 11 g/dL — ABNORMAL LOW (ref 13.0–17.0)
Hemoglobin: 12.1 g/dL — ABNORMAL LOW (ref 13.0–17.0)
Hemoglobin: 13.5 g/dL (ref 13.0–17.0)
MCHC: 32 g/dL (ref 30.0–36.0)
MCHC: 32.4 g/dL (ref 30.0–36.0)
MCHC: 32.5 g/dL (ref 30.0–36.0)
MCHC: 32.7 g/dL (ref 30.0–36.0)
MCHC: 32.7 g/dL (ref 30.0–36.0)
MCHC: 33 g/dL (ref 30.0–36.0)
MCV: 81 fL (ref 78.0–100.0)
MCV: 81.6 fL (ref 78.0–100.0)
MCV: 81.8 fL (ref 78.0–100.0)
MCV: 82 fL (ref 78.0–100.0)
MCV: 82.1 fL (ref 78.0–100.0)
MCV: 82.2 fL (ref 78.0–100.0)
MCV: 82.7 fL (ref 78.0–100.0)
MCV: 82.7 fL (ref 78.0–100.0)
MCV: 82.7 fL (ref 78.0–100.0)
Platelets: 152 10*3/uL (ref 150–400)
Platelets: 153 10*3/uL (ref 150–400)
Platelets: 161 10*3/uL (ref 150–400)
Platelets: 183 10*3/uL (ref 150–400)
Platelets: 191 10*3/uL (ref 150–400)
Platelets: 208 10*3/uL (ref 150–400)
Platelets: 217 10*3/uL (ref 150–400)
RBC: 3.74 MIL/uL — ABNORMAL LOW (ref 4.22–5.81)
RBC: 4.04 MIL/uL — ABNORMAL LOW (ref 4.22–5.81)
RBC: 4.47 MIL/uL (ref 4.22–5.81)
RBC: 5.05 MIL/uL (ref 4.22–5.81)
RDW: 20.1 % — ABNORMAL HIGH (ref 11.5–15.5)
RDW: 20.3 % — ABNORMAL HIGH (ref 11.5–15.5)
RDW: 20.6 % — ABNORMAL HIGH (ref 11.5–15.5)
RDW: 20.8 % — ABNORMAL HIGH (ref 11.5–15.5)
RDW: 21.3 % — ABNORMAL HIGH (ref 11.5–15.5)
WBC: 10 10*3/uL (ref 4.0–10.5)
WBC: 10.9 10*3/uL — ABNORMAL HIGH (ref 4.0–10.5)
WBC: 13.4 10*3/uL — ABNORMAL HIGH (ref 4.0–10.5)
WBC: 14.5 10*3/uL — ABNORMAL HIGH (ref 4.0–10.5)
WBC: 16.5 10*3/uL — ABNORMAL HIGH (ref 4.0–10.5)
WBC: 20.6 10*3/uL — ABNORMAL HIGH (ref 4.0–10.5)
WBC: 6 10*3/uL (ref 4.0–10.5)
WBC: 6.9 10*3/uL (ref 4.0–10.5)
WBC: 9.3 10*3/uL (ref 4.0–10.5)

## 2010-05-13 LAB — RENAL FUNCTION PANEL
Albumin: 2.5 g/dL — ABNORMAL LOW (ref 3.5–5.2)
Albumin: 2.6 g/dL — ABNORMAL LOW (ref 3.5–5.2)
Albumin: 2.7 g/dL — ABNORMAL LOW (ref 3.5–5.2)
Albumin: 2.7 g/dL — ABNORMAL LOW (ref 3.5–5.2)
BUN: 115 mg/dL — ABNORMAL HIGH (ref 6–23)
BUN: 18 mg/dL (ref 6–23)
BUN: 36 mg/dL — ABNORMAL HIGH (ref 6–23)
BUN: 93 mg/dL — ABNORMAL HIGH (ref 6–23)
CO2: 22 mEq/L (ref 19–32)
CO2: 24 mEq/L (ref 19–32)
CO2: 25 mEq/L (ref 19–32)
CO2: 26 mEq/L (ref 19–32)
Calcium: 8.7 mg/dL (ref 8.4–10.5)
Calcium: 9 mg/dL (ref 8.4–10.5)
Chloride: 103 mEq/L (ref 96–112)
Chloride: 104 mEq/L (ref 96–112)
Chloride: 104 mEq/L (ref 96–112)
Chloride: 106 mEq/L (ref 96–112)
Creatinine, Ser: 2.71 mg/dL — ABNORMAL HIGH (ref 0.4–1.5)
Creatinine, Ser: 2.77 mg/dL — ABNORMAL HIGH (ref 0.4–1.5)
Creatinine, Ser: 2.81 mg/dL — ABNORMAL HIGH (ref 0.4–1.5)
Creatinine, Ser: 3.1 mg/dL — ABNORMAL HIGH (ref 0.4–1.5)
GFR calc Af Amer: 24 mL/min — ABNORMAL LOW (ref >60)
GFR calc Af Amer: 30 mL/min — ABNORMAL LOW (ref >60)
GFR calc non Af Amer: 20 mL/min — ABNORMAL LOW (ref >60)
GFR calc non Af Amer: 25 mL/min — ABNORMAL LOW (ref >60)
GFR calc non Af Amer: 29 mL/min — ABNORMAL LOW (ref >60)
Glucose, Bld: 142 mg/dL — ABNORMAL HIGH (ref 70–99)
Glucose, Bld: 147 mg/dL — ABNORMAL HIGH (ref 70–99)
Glucose, Bld: 169 mg/dL — ABNORMAL HIGH (ref 70–99)
Glucose, Bld: 234 mg/dL — ABNORMAL HIGH (ref 70–99)
Phosphorus: 2.8 mg/dL (ref 2.3–4.6)
Phosphorus: 4.2 mg/dL (ref 2.3–4.6)
Potassium: 3.6 mEq/L (ref 3.5–5.1)
Potassium: 3.7 mEq/L (ref 3.5–5.1)
Potassium: 4.7 mEq/L (ref 3.5–5.1)
Potassium: 5 mEq/L (ref 3.5–5.1)
Sodium: 131 mEq/L — ABNORMAL LOW (ref 135–145)
Sodium: 132 mEq/L — ABNORMAL LOW (ref 135–145)
Sodium: 139 mEq/L (ref 135–145)
Sodium: 140 mEq/L (ref 135–145)

## 2010-05-13 LAB — URINE MICROSCOPIC-ADD ON

## 2010-05-13 LAB — BASIC METABOLIC PANEL
BUN: 106 mg/dL — ABNORMAL HIGH (ref 6–23)
BUN: 24 mg/dL — ABNORMAL HIGH (ref 6–23)
BUN: 52 mg/dL — ABNORMAL HIGH (ref 6–23)
BUN: 96 mg/dL — ABNORMAL HIGH (ref 6–23)
CO2: 24 mEq/L (ref 19–32)
CO2: 25 mEq/L (ref 19–32)
CO2: 25 mEq/L (ref 19–32)
CO2: 26 mEq/L (ref 19–32)
Calcium: 8.5 mg/dL (ref 8.4–10.5)
Calcium: 8.6 mg/dL (ref 8.4–10.5)
Calcium: 8.8 mg/dL (ref 8.4–10.5)
Calcium: 8.8 mg/dL (ref 8.4–10.5)
Calcium: 8.8 mg/dL (ref 8.4–10.5)
Chloride: 102 mEq/L (ref 96–112)
Chloride: 102 mEq/L (ref 96–112)
Chloride: 106 mEq/L (ref 96–112)
Creatinine, Ser: 1.88 mg/dL — ABNORMAL HIGH (ref 0.4–1.5)
Creatinine, Ser: 2.59 mg/dL — ABNORMAL HIGH (ref 0.4–1.5)
Creatinine, Ser: 3.64 mg/dL — ABNORMAL HIGH (ref 0.4–1.5)
GFR calc Af Amer: 20 mL/min — ABNORMAL LOW (ref >60)
GFR calc Af Amer: 30 mL/min — ABNORMAL LOW (ref >60)
GFR calc Af Amer: 32 mL/min — ABNORMAL LOW (ref >60)
GFR calc Af Amer: 43 mL/min — ABNORMAL LOW (ref >60)
GFR calc Af Amer: 51 mL/min — ABNORMAL LOW (ref >60)
GFR calc non Af Amer: 25 mL/min — ABNORMAL LOW (ref >60)
GFR calc non Af Amer: 26 mL/min — ABNORMAL LOW (ref >60)
GFR calc non Af Amer: 36 mL/min — ABNORMAL LOW (ref >60)
GFR calc non Af Amer: 42 mL/min — ABNORMAL LOW (ref >60)
GFR calc non Af Amer: 45 mL/min — ABNORMAL LOW (ref >60)
Glucose, Bld: 140 mg/dL — ABNORMAL HIGH (ref 70–99)
Glucose, Bld: 260 mg/dL — ABNORMAL HIGH (ref 70–99)
Glucose, Bld: 77 mg/dL (ref 70–99)
Glucose, Bld: 79 mg/dL (ref 70–99)
Glucose, Bld: 93 mg/dL (ref 70–99)
Potassium: 3.4 mEq/L — ABNORMAL LOW (ref 3.5–5.1)
Potassium: 3.8 mEq/L (ref 3.5–5.1)
Potassium: 4.3 mEq/L (ref 3.5–5.1)
Potassium: 4.5 mEq/L (ref 3.5–5.1)
Potassium: 4.7 mEq/L (ref 3.5–5.1)
Sodium: 135 mEq/L (ref 135–145)
Sodium: 135 mEq/L (ref 135–145)
Sodium: 138 mEq/L (ref 135–145)
Sodium: 139 mEq/L (ref 135–145)
Sodium: 139 mEq/L (ref 135–145)

## 2010-05-13 LAB — GLUCOSE, CAPILLARY
Glucose-Capillary: 104 mg/dL — ABNORMAL HIGH (ref 70–99)
Glucose-Capillary: 108 mg/dL — ABNORMAL HIGH (ref 70–99)
Glucose-Capillary: 110 mg/dL — ABNORMAL HIGH (ref 70–99)
Glucose-Capillary: 112 mg/dL — ABNORMAL HIGH (ref 70–99)
Glucose-Capillary: 114 mg/dL — ABNORMAL HIGH (ref 70–99)
Glucose-Capillary: 117 mg/dL — ABNORMAL HIGH (ref 70–99)
Glucose-Capillary: 117 mg/dL — ABNORMAL HIGH (ref 70–99)
Glucose-Capillary: 118 mg/dL — ABNORMAL HIGH (ref 70–99)
Glucose-Capillary: 120 mg/dL — ABNORMAL HIGH (ref 70–99)
Glucose-Capillary: 121 mg/dL — ABNORMAL HIGH (ref 70–99)
Glucose-Capillary: 121 mg/dL — ABNORMAL HIGH (ref 70–99)
Glucose-Capillary: 123 mg/dL — ABNORMAL HIGH (ref 70–99)
Glucose-Capillary: 125 mg/dL — ABNORMAL HIGH (ref 70–99)
Glucose-Capillary: 126 mg/dL — ABNORMAL HIGH (ref 70–99)
Glucose-Capillary: 126 mg/dL — ABNORMAL HIGH (ref 70–99)
Glucose-Capillary: 126 mg/dL — ABNORMAL HIGH (ref 70–99)
Glucose-Capillary: 127 mg/dL — ABNORMAL HIGH (ref 70–99)
Glucose-Capillary: 128 mg/dL — ABNORMAL HIGH (ref 70–99)
Glucose-Capillary: 129 mg/dL — ABNORMAL HIGH (ref 70–99)
Glucose-Capillary: 129 mg/dL — ABNORMAL HIGH (ref 70–99)
Glucose-Capillary: 130 mg/dL — ABNORMAL HIGH (ref 70–99)
Glucose-Capillary: 132 mg/dL — ABNORMAL HIGH (ref 70–99)
Glucose-Capillary: 135 mg/dL — ABNORMAL HIGH (ref 70–99)
Glucose-Capillary: 136 mg/dL — ABNORMAL HIGH (ref 70–99)
Glucose-Capillary: 136 mg/dL — ABNORMAL HIGH (ref 70–99)
Glucose-Capillary: 138 mg/dL — ABNORMAL HIGH (ref 70–99)
Glucose-Capillary: 140 mg/dL — ABNORMAL HIGH (ref 70–99)
Glucose-Capillary: 143 mg/dL — ABNORMAL HIGH (ref 70–99)
Glucose-Capillary: 143 mg/dL — ABNORMAL HIGH (ref 70–99)
Glucose-Capillary: 146 mg/dL — ABNORMAL HIGH (ref 70–99)
Glucose-Capillary: 146 mg/dL — ABNORMAL HIGH (ref 70–99)
Glucose-Capillary: 148 mg/dL — ABNORMAL HIGH (ref 70–99)
Glucose-Capillary: 149 mg/dL — ABNORMAL HIGH (ref 70–99)
Glucose-Capillary: 150 mg/dL — ABNORMAL HIGH (ref 70–99)
Glucose-Capillary: 152 mg/dL — ABNORMAL HIGH (ref 70–99)
Glucose-Capillary: 154 mg/dL — ABNORMAL HIGH (ref 70–99)
Glucose-Capillary: 154 mg/dL — ABNORMAL HIGH (ref 70–99)
Glucose-Capillary: 155 mg/dL — ABNORMAL HIGH (ref 70–99)
Glucose-Capillary: 158 mg/dL — ABNORMAL HIGH (ref 70–99)
Glucose-Capillary: 159 mg/dL — ABNORMAL HIGH (ref 70–99)
Glucose-Capillary: 159 mg/dL — ABNORMAL HIGH (ref 70–99)
Glucose-Capillary: 164 mg/dL — ABNORMAL HIGH (ref 70–99)
Glucose-Capillary: 165 mg/dL — ABNORMAL HIGH (ref 70–99)
Glucose-Capillary: 169 mg/dL — ABNORMAL HIGH (ref 70–99)
Glucose-Capillary: 171 mg/dL — ABNORMAL HIGH (ref 70–99)
Glucose-Capillary: 172 mg/dL — ABNORMAL HIGH (ref 70–99)
Glucose-Capillary: 176 mg/dL — ABNORMAL HIGH (ref 70–99)
Glucose-Capillary: 177 mg/dL — ABNORMAL HIGH (ref 70–99)
Glucose-Capillary: 179 mg/dL — ABNORMAL HIGH (ref 70–99)
Glucose-Capillary: 185 mg/dL — ABNORMAL HIGH (ref 70–99)
Glucose-Capillary: 187 mg/dL — ABNORMAL HIGH (ref 70–99)
Glucose-Capillary: 188 mg/dL — ABNORMAL HIGH (ref 70–99)
Glucose-Capillary: 193 mg/dL — ABNORMAL HIGH (ref 70–99)
Glucose-Capillary: 194 mg/dL — ABNORMAL HIGH (ref 70–99)
Glucose-Capillary: 195 mg/dL — ABNORMAL HIGH (ref 70–99)
Glucose-Capillary: 196 mg/dL — ABNORMAL HIGH (ref 70–99)
Glucose-Capillary: 196 mg/dL — ABNORMAL HIGH (ref 70–99)
Glucose-Capillary: 211 mg/dL — ABNORMAL HIGH (ref 70–99)
Glucose-Capillary: 216 mg/dL — ABNORMAL HIGH (ref 70–99)
Glucose-Capillary: 221 mg/dL — ABNORMAL HIGH (ref 70–99)
Glucose-Capillary: 224 mg/dL — ABNORMAL HIGH (ref 70–99)
Glucose-Capillary: 224 mg/dL — ABNORMAL HIGH (ref 70–99)
Glucose-Capillary: 229 mg/dL — ABNORMAL HIGH (ref 70–99)
Glucose-Capillary: 231 mg/dL — ABNORMAL HIGH (ref 70–99)
Glucose-Capillary: 235 mg/dL — ABNORMAL HIGH (ref 70–99)
Glucose-Capillary: 238 mg/dL — ABNORMAL HIGH (ref 70–99)
Glucose-Capillary: 238 mg/dL — ABNORMAL HIGH (ref 70–99)
Glucose-Capillary: 244 mg/dL — ABNORMAL HIGH (ref 70–99)
Glucose-Capillary: 249 mg/dL — ABNORMAL HIGH (ref 70–99)
Glucose-Capillary: 258 mg/dL — ABNORMAL HIGH (ref 70–99)
Glucose-Capillary: 260 mg/dL — ABNORMAL HIGH (ref 70–99)
Glucose-Capillary: 261 mg/dL — ABNORMAL HIGH (ref 70–99)
Glucose-Capillary: 306 mg/dL — ABNORMAL HIGH (ref 70–99)
Glucose-Capillary: 327 mg/dL — ABNORMAL HIGH (ref 70–99)
Glucose-Capillary: 340 mg/dL — ABNORMAL HIGH (ref 70–99)
Glucose-Capillary: 47 mg/dL — ABNORMAL LOW (ref 70–99)
Glucose-Capillary: 54 mg/dL — ABNORMAL LOW (ref 70–99)
Glucose-Capillary: 86 mg/dL (ref 70–99)
Glucose-Capillary: 86 mg/dL (ref 70–99)
Glucose-Capillary: 87 mg/dL (ref 70–99)
Glucose-Capillary: 91 mg/dL (ref 70–99)
Glucose-Capillary: 98 mg/dL (ref 70–99)
Glucose-Capillary: 99 mg/dL (ref 70–99)

## 2010-05-13 LAB — URINE CULTURE

## 2010-05-13 LAB — DIFFERENTIAL
Eosinophils Relative: 3 % (ref 0–5)
Lymphocytes Relative: 16 % (ref 12–46)
Lymphs Abs: 0.9 10*3/uL (ref 0.7–4.0)
Monocytes Absolute: 0.5 10*3/uL (ref 0.1–1.0)
Monocytes Relative: 8 % (ref 3–12)
Neutro Abs: 4.4 10*3/uL (ref 1.7–7.7)

## 2010-05-13 LAB — POCT I-STAT 3, ART BLOOD GAS (G3+)
Acid-base deficit: 1 mmol/L (ref 0.0–2.0)
Bicarbonate: 23.8 mEq/L (ref 20.0–24.0)
O2 Saturation: 93 %
O2 Saturation: 98 %
TCO2: 23 mmol/L (ref 0–100)
pCO2 arterial: 37.1 mmHg (ref 35.0–45.0)
pO2, Arterial: 101 mmHg — ABNORMAL HIGH (ref 80.0–100.0)
pO2, Arterial: 68 mmHg — ABNORMAL LOW (ref 80.0–100.0)

## 2010-05-13 LAB — CATH TIP CULTURE

## 2010-05-13 LAB — CULTURE, BLOOD (ROUTINE X 2): Culture: NO GROWTH

## 2010-05-13 LAB — URINALYSIS, ROUTINE W REFLEX MICROSCOPIC
Nitrite: NEGATIVE
Specific Gravity, Urine: 1.026 (ref 1.005–1.030)
pH: 5 (ref 5.0–8.0)

## 2010-05-13 LAB — COMPREHENSIVE METABOLIC PANEL
AST: 15 U/L (ref 0–37)
Albumin: 2.7 g/dL — ABNORMAL LOW (ref 3.5–5.2)
BUN: 36 mg/dL — ABNORMAL HIGH (ref 6–23)
Calcium: 8.9 mg/dL (ref 8.4–10.5)
Chloride: 105 mEq/L (ref 96–112)
Creatinine, Ser: 1.54 mg/dL — ABNORMAL HIGH (ref 0.4–1.5)
GFR calc Af Amer: 54 mL/min — ABNORMAL LOW (ref >60)
Total Protein: 5.9 g/dL — ABNORMAL LOW (ref 6.0–8.3)

## 2010-05-13 LAB — BRAIN NATRIURETIC PEPTIDE: Pro B Natriuretic peptide (BNP): 294 pg/mL — ABNORMAL HIGH (ref 0.0–100.0)

## 2010-05-13 LAB — TACROLIMUS LEVEL: Tacrolimus (FK506) - LabCorp: 8 ng/mL

## 2010-05-24 ENCOUNTER — Encounter: Payer: Self-pay | Admitting: Critical Care Medicine

## 2010-05-31 LAB — COMPREHENSIVE METABOLIC PANEL
AST: 27 U/L (ref 0–37)
Albumin: 3.5 g/dL (ref 3.5–5.2)
Alkaline Phosphatase: 77 U/L (ref 39–117)
BUN: 41 mg/dL — ABNORMAL HIGH (ref 6–23)
Creatinine, Ser: 2.04 mg/dL — ABNORMAL HIGH (ref 0.4–1.5)
GFR calc Af Amer: 39 mL/min — ABNORMAL LOW (ref >60)
Potassium: 4.2 mEq/L (ref 3.5–5.1)
Total Protein: 7.3 g/dL (ref 6.0–8.3)

## 2010-05-31 LAB — DIFFERENTIAL
Eosinophils Relative: 1 % (ref 0–5)
Lymphocytes Relative: 7 % — ABNORMAL LOW (ref 12–46)
Monocytes Absolute: 0.4 10*3/uL (ref 0.1–1.0)
Monocytes Relative: 5 % (ref 3–12)
Neutro Abs: 7.1 10*3/uL (ref 1.7–7.7)

## 2010-05-31 LAB — GLUCOSE, CAPILLARY
Glucose-Capillary: 112 mg/dL — ABNORMAL HIGH (ref 70–99)
Glucose-Capillary: 113 mg/dL — ABNORMAL HIGH (ref 70–99)

## 2010-05-31 LAB — CBC
HCT: 34.6 % — ABNORMAL LOW (ref 39.0–52.0)
Platelets: 151 10*3/uL (ref 150–400)
RDW: 17.4 % — ABNORMAL HIGH (ref 11.5–15.5)
WBC: 8.3 10*3/uL (ref 4.0–10.5)

## 2010-06-07 LAB — GLUCOSE, CAPILLARY: Glucose-Capillary: 169 mg/dL — ABNORMAL HIGH (ref 70–99)

## 2010-07-01 ENCOUNTER — Ambulatory Visit (INDEPENDENT_AMBULATORY_CARE_PROVIDER_SITE_OTHER): Payer: Medicare Other | Admitting: Physician Assistant

## 2010-07-01 ENCOUNTER — Encounter: Payer: Self-pay | Admitting: Physician Assistant

## 2010-07-01 VITALS — BP 130/70 | HR 80 | Ht 74.0 in | Wt 218.0 lb

## 2010-07-01 DIAGNOSIS — R0602 Shortness of breath: Secondary | ICD-10-CM

## 2010-07-01 DIAGNOSIS — J90 Pleural effusion, not elsewhere classified: Secondary | ICD-10-CM

## 2010-07-01 DIAGNOSIS — I2510 Atherosclerotic heart disease of native coronary artery without angina pectoris: Secondary | ICD-10-CM

## 2010-07-01 LAB — CBC WITH DIFFERENTIAL/PLATELET
Eosinophils Relative: 1.1 % (ref 0.0–5.0)
HCT: 36.4 % — ABNORMAL LOW (ref 39.0–52.0)
Hemoglobin: 12.1 g/dL — ABNORMAL LOW (ref 13.0–17.0)
Lymphs Abs: 1.8 10*3/uL (ref 0.7–4.0)
Monocytes Relative: 7 % (ref 3.0–12.0)
Neutro Abs: 7.8 10*3/uL — ABNORMAL HIGH (ref 1.4–7.7)
RDW: 19.7 % — ABNORMAL HIGH (ref 11.5–14.6)
WBC: 10.5 10*3/uL (ref 4.5–10.5)

## 2010-07-01 LAB — BASIC METABOLIC PANEL
Chloride: 104 mEq/L (ref 96–112)
GFR: 88.55 mL/min (ref >60.00)
Glucose, Bld: 148 mg/dL — ABNORMAL HIGH (ref 70–99)
Potassium: 4.9 mEq/L (ref 3.5–5.1)
Sodium: 139 mEq/L (ref 135–145)

## 2010-07-01 NOTE — Patient Instructions (Signed)
Your physician has requested that you have en exercise stress myoview 786.05. For further information please visit [email or web address] Please follow instruction sheet, as given.  A chest x-ray takes a picture of the organs and structures inside the chest, including the heart, lungs, and blood vessels 786.05. This test can show several things, including, whether the heart is enlarges; whether fluid is building up in the lungs; and whether pacemaker / defibrillator leads are still in place.  Your physician recommends that you return for lab work in: TODAY BMET, CBC W/DIFF, BNP 786.05  Your physician recommends that you schedule a follow-up appointment in:2 WEEKS WITH DR. Shirlee Latch AS PER SCOTT WEAVER, PA-C

## 2010-07-01 NOTE — Assessment & Plan Note (Signed)
As noted, repeat chest x-ray.

## 2010-07-01 NOTE — Assessment & Plan Note (Signed)
Continue aspirin.  As noted, proceed with stress Myoview.

## 2010-07-01 NOTE — Assessment & Plan Note (Addendum)
Etiology not entirely clear.  He does have a history of diastolic heart failure.  He does not look particularly volume overloaded to me.  However, his weight is up 4 pounds.  I will check a basic metabolic panel and a BNP today.  If his BNP is elevated, I will have him increase his Lasix.  He also has a history of a chronic right pleural effusion.  I will have him obtain a chest x-ray to reassess his effusion.  He is concerned that his symptoms may be signifying a problem with his bypass grafts.  He is just about a little over a year out from his bypass.  The likelihood that he has had graft failure is quite low.  However, he is a diabetic.  He also has a first degree AV block but is quite long.  He denies any symptoms of syncope or near syncope.  He did not currently have bradycardia.  I will set him up for a treadmill Myoview.  We can assess his chronotropic competence with this as well as rule out the possibility of graft failure.  I will bring him back in close follow up in the next few weeks with Dr. Shirlee Latch.  If his cardiac workup is negative, I would suggest proceeding with pulmonary function testing as he does have evidence of COPD on his prior chest x-rays with a prior history of smoking.  Of note, we ambulated him in the office and his oxygen saturation remained at 97%.

## 2010-07-01 NOTE — Progress Notes (Signed)
History of Present Illness: Primary Cardiologist:  Dr. [name]  John [name] is a 70 y.o. male with history of renal transplant, DM, HTN, and CAD s/p CABG.  Patient had a long and complicated hospital admission in 1/11.  Patient was admitted to [facility] at that time with a UTI and pulmonary edema.  Troponin was noted to be elevated and the patient had a nuclear scan with a large fixed inferior defect.  He ended up having a cardiac catheterization showing severe disease involving the LM, LAD, ramus, CFX and RCA.  He had CABG x 5.  Post-op course was complicated by pulmonary edema requiring intubation, renal failure requiring CVVH, and atrial fibrillation.  He was in the hospital about 7 weeks.  He has PAD and has undergone R SFA and Pop atherectomy in 11/11.  Over the last 3 months, he has noted some increased shortness of breath and certain situations.  He is able ride a stationary bicycle without symptoms.  However, when he goes up steps, especially carrying something like a laundry basket, he develops significant shortness of breath.  It sounds that he is describing NYHA class IIB-3 symptoms.  He denies PND.  He sleeps on 3 pillows.  This is chronic without significant change.  He has not noted any weight gain or increased edema.  He denies chest discomfort.  He denies syncope or near-syncope.   Past Medical History  Diagnosis Date  . CAD (coronary artery disease)     a. myoview 1/11: lg inf fixed defect;   b. cath 1/11: 3v CAD,  c. s/p CABG 1/11 c/b acute renal failure and AFib  . Atrial fibrillation   . Chronic diastolic heart failure     a. echo 4/11: EF 45-50%, mod LVH, LAE, inf and septal HK  . First degree AV block   . Orthostatic hypotension     pyridostigmine  . History of renal transplant   . DM2 (diabetes mellitus, type 2)   . Diabetic gastroparesis   . HTN (hypertension)   . HLD (hyperlipidemia)   . PAD (peripheral artery disease)     s/p R SFA-Pop atherectomy 11/11    . Prostate cancer   . ED (erectile dysfunction)   . DDD (degenerative disc disease), lumbar   . Tremor   . Obesity   . Low testosterone     Current Outpatient Prescriptions  Medication Sig Dispense Refill  . Acetylcysteine (NAC 600 PO) Take by mouth 2 (two) times daily.        Marland Kitchen aspirin 325 MG EC tablet Take 325 mg by mouth daily.        Marland Kitchen atorvastatin (LIPITOR) 40 MG tablet Take 40 mg by mouth daily.        . carbidopa-levodopa (SINEMET CR) 25-100 MG per tablet Take 1 tablet by mouth 3 (three) times daily.        . Cholecalciferol (VITAMIN D3) 1000 UNITS CAPS Take by mouth. Twice a week.       . clopidogrel (PLAVIX) 75 MG tablet Take 75 mg by mouth daily.        . DULoxetine (CYMBALTA) 60 MG capsule Take 60 mg by mouth daily.        Marland Kitchen ezetimibe (ZETIA) 10 MG tablet Take 10 mg by mouth daily.        . Ferrous Sulfate (IRON) 325 (65 FE) MG TABS Take by mouth daily.        . furosemide (LASIX) 40 MG tablet Take  40 mg by mouth 2 (two) times daily.        . Insulin Aspart (NOVOLOG [place]) Inject into the skin. Via pump as directed        . LamoTRIgine (LAMICTAL ODT PO) Take by mouth as directed.        Marland Kitchen lisinopril (PRINIVIL,ZESTRIL) 2.5 MG tablet Take 2.5 mg by mouth daily.        . magnesium oxide (MAG-OX) 400 MG tablet Take 400 mg by mouth daily.        . mycophenolate (CELLCEPT) 500 MG tablet Take by mouth 2 (two) times daily.        . Omeprazole 20 MG TBEC Take by mouth daily.        Marland Kitchen oxyCODONE-acetaminophen (PERCOCET) 7.5-500 MG per tablet Take 1 tablet by mouth every 4 (four) hours as needed.        . predniSONE (DELTASONE) 5 MG tablet Take 5 mg by mouth daily.        Marland Kitchen pyridostigmine (MESTINON) 60 MG tablet Take 60 mg by mouth 2 (two) times daily.        . sitaGLIPtan (JANUVIA) 100 MG tablet Take 100 mg by mouth daily.        . tacrolimus (PROGRAF) 5 MG capsule Take 5 mg by mouth 2 (two) times daily.        . Tamsulosin HCl (FLOMAX) 0.4 MG CAPS Take by mouth daily.        .  temazepam (RESTORIL) 15 MG capsule Take 15 mg by mouth at bedtime as needed.        . Testosterone (ANDROGEL PUMP TD) Place onto the skin as directed.        . vitamin B-12 (CYANOCOBALAMIN) 500 MCG tablet Take 500 mcg by mouth daily.          Allergies: No Known Allergies  ROS:  Please see the history of present illness.  He denies significant fevers or chills.  He has a slight cough.  He denies hemoptysis.  He denies pleuritic symptoms.  All other systems reviewed and negative.  Vital Signs: BP 130/70  Pulse 80  Ht 6\' 2"  (1.88 m)  Wt 218 lb (98.884 kg)  BMI 27.99 kg/m2  PHYSICAL EXAM: Well nourished, well developed, in no acute distress HEENT: normal Neck: no JVD Cardiac:  normal S1, S2; RRR; no murmur Lungs:  clear to auscultation bilaterally, no wheezing, rhonchi or rales, No egophony Abd: soft, nontender, no hepatomegaly Ext: Trace bilateral edema Skin: warm and dry Neuro:  CNs 2-12 intact, no focal abnormalities noted Psych: Normal affect  EKG:  Sinus rhythm, heart rate 80, left axis deviation, severe first degree AV block with a PR interval of 356 ms, poor R-wave progression, nonspecific ST-T wave changes  ASSESSMENT AND PLAN:

## 2010-07-04 ENCOUNTER — Ambulatory Visit (INDEPENDENT_AMBULATORY_CARE_PROVIDER_SITE_OTHER): Payer: Medicare Other | Admitting: Cardiology

## 2010-07-04 ENCOUNTER — Encounter: Payer: Self-pay | Admitting: Cardiology

## 2010-07-04 ENCOUNTER — Ambulatory Visit (HOSPITAL_COMMUNITY): Payer: Medicare Other | Attending: Physician Assistant | Admitting: Radiology

## 2010-07-04 VITALS — Ht 74.0 in | Wt 210.0 lb

## 2010-07-04 DIAGNOSIS — J90 Pleural effusion, not elsewhere classified: Secondary | ICD-10-CM

## 2010-07-04 DIAGNOSIS — E785 Hyperlipidemia, unspecified: Secondary | ICD-10-CM

## 2010-07-04 DIAGNOSIS — I2510 Atherosclerotic heart disease of native coronary artery without angina pectoris: Secondary | ICD-10-CM

## 2010-07-04 DIAGNOSIS — R0609 Other forms of dyspnea: Secondary | ICD-10-CM

## 2010-07-04 DIAGNOSIS — R9430 Abnormal result of cardiovascular function study, unspecified: Secondary | ICD-10-CM | POA: Insufficient documentation

## 2010-07-04 DIAGNOSIS — R0602 Shortness of breath: Secondary | ICD-10-CM | POA: Insufficient documentation

## 2010-07-04 MED ORDER — TECHNETIUM TC 99M TETROFOSMIN IV KIT
11.0000 | PACK | Freq: Once | INTRAVENOUS | Status: AC | PRN
  Administered 2010-07-04: 11 via INTRAVENOUS

## 2010-07-04 MED ORDER — TECHNETIUM TC 99M TETROFOSMIN IV KIT
33.0000 | PACK | Freq: Once | INTRAVENOUS | Status: AC | PRN
  Administered 2010-07-04: 33 via INTRAVENOUS

## 2010-07-04 MED ORDER — REGADENOSON 0.4 MG/5ML IV SOLN
0.4000 mg | Freq: Once | INTRAVENOUS | Status: AC
  Administered 2010-07-04: 0.4 mg via INTRAVENOUS

## 2010-07-04 NOTE — Assessment & Plan Note (Signed)
Continue statin. 

## 2010-07-04 NOTE — Assessment & Plan Note (Signed)
Continue aspirin and statin. 

## 2010-07-04 NOTE — Assessment & Plan Note (Signed)
May need thoracentesis but will await followup chest x-ray.

## 2010-07-04 NOTE — Assessment & Plan Note (Signed)
I reviewed the patient's nuclear study with him. It appears to reveal moderate inferior ischemia. Remaining vascular territories are perfused normally. Certainly ischemia could be contributing to his dyspnea. However he also has some deconditioning from his previous bypass surgery. He also has a chronic pleural effusion. He only notices his dyspnea with more extreme activities. He can ride his bicycle for one hour with no symptoms. He is appropriately concerned about repeat procedures given his rocky course following his previous bypass. He is also concerned about his transplanted kidney. I have elected to continue with medical therapy for now. We will plan for followup on his PA and lateral chest x-ray. I wonder if his pleural effusion may be contributing to his dyspnea. He will see Dr. Shirlee Latch in the near future once all the data available. He may ultimately require cardiac catheterization. I do not think his symptoms are unstable.

## 2010-07-04 NOTE — Progress Notes (Signed)
[facility] SITE 3 NUCLEAR MED [address] [place] [address] [phone]  Cardiology Nuclear Med Study  John [name] is a 70 y.o. male [id number] 01-Jun-1940   Nuclear Med Background Indication for Stress Test:  Evaluation for Ischemia and Graft Patency History:  01/11CABG:x5, 04/11 Echo: EF 45-50%, 01/11 Heart Catheterization: 3V CAD-CABG and 01/11 Myocardial Perfusion Study: Lg inferior defect Cardiac Risk Factors: Claudication, History of Smoking, Hypertension, IDDM Type 2, Lipids and PVD  Symptoms:  DOE, Palpitations and SOB   Nuclear Pre-Procedure Caffeine/Decaff Intake:  None NPO After: 10:30pm   Lungs:  clear IV 0.9% NS with Angio Cath:  20g  IV Site: R Antecubital  IV Started by:  Stanton Kidney, EMT-P  Chest Size (in):  43  Cup Size: n/a  Height: 6\' 2"  (1.88 m)  Weight:  210 lb (95.255 kg)  BMI:  Body mass index is 26.96 kg/(m^2). Tech Comments:  CBG = 189 @ 12pm today.  This patient became very sob with exercise and was given Lexiscan and had to be given O2. His pictures were checked and were abnormal. DOD Dr. Velta Addison saw the patient.    Nuclear Med Study 1 or 2 day study: 1 day  Stress Test Type:  Treadmill/Lexiscan  Reading MD: Dietrich Pates, MD  Order Authorizing Provider:  D.McLean  Resting Radionuclide: Technetium 56m Tetrofosmin  Resting Radionuclide Dose:61mCi   Stress Radionuclide:  Technetium 42m Tetrofosmin  Stress Radionuclide Dose: 33 mCi           Stress Protocol Rest HR: 71 Stress HR: 117  Rest BP: 158/77 Stress BP: 183/64  Exercise Time (min): 3:39 METS: 4.60   Predicted Max HR: 150 bpm % Max HR: 78 bpm Rate Pressure Product: 21308   Dose of Adenosine (mg):  n/a Dose of Lexiscan: 0.4 mg  Dose of Atropine (mg): n/a Dose of Dobutamine: n/a mcg/kg/min (at max HR)  Stress Test Technologist: Milana Na, EMT-P  Nuclear Technologist:  Domenic Polite, CNMT     Rest Procedure:  Myocardial perfusion imaging was  performed at rest 45 minutes following the intravenous administration of Technetium 63m Tetrofosmin. Rest ECG: SR 1st Degree AVB  Stress Procedure:  The patient received IV Lexiscan 0.4 mg over 15-seconds with concurrent low level exercise and then Technetium 20m Tetrofosmin was injected at 30-seconds while the patient continued walking one more minute.  There were non specific changes and rare pvcs with Lexiscan.  Quantitative spect images were obtained after a 45-minute delay. Stress ECG: No significant change from baseline ECG  QPS Raw Data Images:  Soft tissue (diaphragm) underlies heart. Stress Images:  Defect in the inferior wall (base, mid, distal); inferolateral wall (distal).  Otherwsie normal perfusion. Rest Images:  Incomplete improvement in the infeior wall. Subtraction (SDS):  These findings are consistent with ischemia. Transient Ischemic Dilatation (Normal <1.22): 1.02 Lung/Heart Ratio (Normal <0.45):  .29   Quantitative Gated Spect Images QGS EDV:  126 ml QGS ESV:  51ml QGS cine images:  NL LV Function; NL Wall Motion QGS EF: 59%  Impression Exercise Capacity:  Poor exercise capacity.  O2 sats decreased to 81% with extreme SOB.  Improved in recovery. BP Response:  Normal blood pressure response. Clinical Symptoms:  SOB  No chest pain. ECG Impression:  No significant ST segment change suggestive of ischemia. Comparison with Prior Nuclear Study: not available for comparison.  Overall Impression:  Ischemia  With possible subendocardial scar vs soft tissue attenuation in the inferior wall.

## 2010-07-04 NOTE — Progress Notes (Signed)
HPI: John [name] is a 70 y.o. male with history of renal transplant, DM, HTN, and CAD s/p CABG. Patient had a long and complicated hospital admission in 1/11. Patient was admitted to [facility] at that time with a UTI and pulmonary edema. Troponin was noted to be elevated and the patient had a nuclear scan with a large fixed inferior defect. He ended up having a cardiac catheterization showing severe disease involving the LM, LAD, ramus, CFX and RCA. He had CABG x 5. Post-op course was complicated by pulmonary edema requiring intubation, renal failure requiring CVVH, and atrial fibrillation. He was in the hospital about 7 weeks. He has PAD and has undergone R SFA and Pop atherectomy in 11/11.  Patient seen recently by Tereso Newcomer for dyspnea and myoview ordered. This revealed and ejection fraction of 59% and probable moderate ischemia in the inferior wall by my review. Patient also with dyspnea. He was therefore added to my schedule. Renal function was normal and BNP was only mildly elevated. Patient also has a chronic right pleural effusion. Repeat chest x-ray was ordered but is pending. The patient states that he had a very complicated course in January of 2011 following his bypass surgery. It has taken him months to recover. Over the past several months he has exerted himself to a more significant degree. When climbing stairs he notices significant dyspnea on exertion. This does not occur with routine activities. He is able to ride a stationary bicycle at a moderate pace for one hour with no dyspnea. There is no orthopnea, PND, pedal edema, chest pain or syncope. We were asked to evaluate the patient because of his abnormal nuclear study.   Current Outpatient Prescriptions  Medication Sig Dispense Refill  . Acetylcysteine (NAC 600 PO) Take by mouth 2 (two) times daily.        Marland Kitchen aspirin 325 MG EC tablet Take 325 mg by mouth daily.        Marland Kitchen atorvastatin (LIPITOR) 40 MG tablet Take 40 mg by mouth  daily.        . Calcium Carb-Cholecalciferol (CALCIUM 1000 + D PO) Take 1,000 mg by mouth daily.        . carbidopa-levodopa (SINEMET CR) 25-100 MG per tablet Take 1 tablet by mouth 3 (three) times daily.        . Cholecalciferol (VITAMIN D3) 1000 UNITS CAPS Take 10,000 mg by mouth. Twice a week.      . clopidogrel (PLAVIX) 75 MG tablet Take 75 mg by mouth daily.        . DULoxetine (CYMBALTA) 60 MG capsule Take 60 mg by mouth daily.        Marland Kitchen ezetimibe (ZETIA) 10 MG tablet Take 10 mg by mouth daily.        . Ferrous Sulfate (IRON) 325 (65 FE) MG TABS Take by mouth daily.        . folic acid (FOLVITE) 800 MCG tablet Take 800 mcg by mouth daily.        . furosemide (LASIX) 40 MG tablet Take 40 mg by mouth 2 (two) times daily.        . Insulin Aspart (NOVOLOG [place]) Inject into the skin. Via pump as directed        . LamoTRIgine (LAMICTAL ODT PO) Take by mouth as directed.        Marland Kitchen lisinopril (PRINIVIL,ZESTRIL) 2.5 MG tablet Take 2.5 mg by mouth daily.        . magnesium oxide (  MAG-OX) 400 MG tablet Take 400 mg by mouth 2 (two) times daily.       . Multiple Vitamin (MULTIVITAMIN) tablet Take 1 tablet by mouth daily.        . mycophenolate (CELLCEPT) 500 MG tablet Take by mouth 2 (two) times daily.        . Omeprazole 20 MG TBEC Take by mouth daily.        Marland Kitchen oxyCODONE-acetaminophen (PERCOCET) 7.5-500 MG per tablet Take 1 tablet by mouth every 4 (four) hours as needed.        . predniSONE (DELTASONE) 5 MG tablet Take 5 mg by mouth daily.        Marland Kitchen pyridostigmine (MESTINON) 60 MG tablet Take 60 mg by mouth 2 (two) times daily.        . sitaGLIPtan (JANUVIA) 100 MG tablet Take 100 mg by mouth daily.        . tacrolimus (PROGRAF) 5 MG capsule Take 5 mg by mouth 2 (two) times daily.        . Tamsulosin HCl (FLOMAX) 0.4 MG CAPS Take by mouth daily.        . temazepam (RESTORIL) 15 MG capsule Take 15 mg by mouth at bedtime as needed.        . Testosterone (ANDROGEL PUMP TD) Place onto the skin as directed.         . vitamin B-12 (CYANOCOBALAMIN) 500 MCG tablet Take 500 mcg by mouth daily.         No current facility-administered medications for this visit.   Facility-Administered Medications Ordered in Other Visits  Medication Dose Route Frequency Provider Last Rate Last Dose  . regadenoson (LEXISCAN) injection SOLN 0.4 mg  0.4 mg Intravenous Once [name], MD   0.4 mg at 07/04/10 1349  . technetium tetrofosmin (TC-MYOVIEW) injection 11 milli Curie  11 milli Curie Intravenous Once PRN [name], MD   11 milli Curie at 07/04/10 1210  . technetium tetrofosmin (TC-MYOVIEW) injection 33 milli Curie  33 milli Curie Intravenous Once PRN [name], MD   33 milli Curie at 07/04/10 1350     Past Medical History  Diagnosis Date  . CAD (coronary artery disease)     a. myoview 1/11: lg inf fixed defect;   b. cath 1/11: 3v CAD,  c. s/p CABG 1/11 c/b acute renal failure and AFib  . Atrial fibrillation   . Chronic diastolic heart failure     a. echo 4/11: EF 45-50%, mod LVH, LAE, inf and septal HK  . First degree AV block   . Orthostatic hypotension     pyridostigmine  . History of renal transplant   . DM2 (diabetes mellitus, type 2)   . Diabetic gastroparesis   . HTN (hypertension)   . HLD (hyperlipidemia)   . PAD (peripheral artery disease)     s/p R SFA-Pop atherectomy 11/11  . Prostate cancer   . ED (erectile dysfunction)   . DDD (degenerative disc disease), lumbar   . Tremor   . Obesity   . Low testosterone     No past surgical history on file.  History   Social History  . Marital Status: Married    Spouse Name: N/A    Number of Children: N/A  . Years of Education: N/A   Occupational History  . Not on file.   Social History Main Topics  . Smoking status: Former Smoker    Quit date: 02/20/1978  . Smokeless tobacco: Not on file  .  Alcohol Use: Not on file  . Drug Use: Not on file  . Sexually Active: Not on file   Other Topics Concern  . Not on file   Social  History Narrative  . No narrative on file    ROS: no fevers or chills, productive cough, hemoptysis, dysphasia, odynophagia, melena, hematochezia, dysuria, hematuria, rash, seizure activity, orthopnea, PND, pedal edema, claudication. Remaining systems are negative.  Physical Exam: Well-developed well-nourished in no acute distress.  Skin is warm and dry.  HEENT is normal.  Neck is supple. No thyromegaly.  Chest diminished breath sounds right lower lobe Cardiovascular exam is regular rate and rhythm. 1/6 systolic murmur left sternal border. Abdominal exam nontender or distended. No masses palpated. Extremities show trace edema. neuro grossly intact

## 2010-07-04 NOTE — Patient Instructions (Signed)
Your physician recommends that you schedule a follow-up appointment in: 07-12-10  @ 3:15 pm  A chest x-ray takes a picture of the organs and structures inside the chest, including the heart, lungs, and blood vessels. This test can show several things, including, whether the heart is enlarges; whether fluid is building up in the lungs; and whether pacemaker / defibrillator leads are still in place.

## 2010-07-05 ENCOUNTER — Ambulatory Visit (INDEPENDENT_AMBULATORY_CARE_PROVIDER_SITE_OTHER)
Admission: RE | Admit: 2010-07-05 | Discharge: 2010-07-05 | Disposition: A | Discharge status: Home / Self Care | Payer: Medicare Other | Source: Ambulatory Visit | Attending: Physician Assistant | Admitting: Physician Assistant

## 2010-07-05 DIAGNOSIS — J90 Pleural effusion, not elsewhere classified: Secondary | ICD-10-CM

## 2010-07-05 NOTE — Assessment & Plan Note (Signed)
OFFICE VISIT   BISHOP, VANDERWERF  DOB:  08-03-1940                                        May 17, 2009  CHART #:  [id number]   HISTORY:  The patient is a 70 year old gentleman who was recently  hospitalized and underwent a coronary artery bypass grafting on April 01, 2009 for severe three-vessel coronary artery disease with congestive  heart failure.  He had a complex postoperative hospital course that  included difficulties related to congestive heart failure, acute renal  failure on chronic renal insufficiency, postoperative pneumonia,  postoperative ventilatory-dependent respiratory failure, and  postoperative atrial fibrillation.  He did progress slowly over time  through his hospitalization to the point where he did spend some time in  rehabilitation and was discharged last Tuesday.  Currently, he reports  that he actually feels as though he is making some progress.  He does  have some ongoing orthostasis and orthostatic hypotension.  This has  been continues to be a chronic problem.  His preoperative ejection  fraction on echocardiogram was 55-60%, but his preoperative value on  nuclear stress testing showed his ejection fraction to be 44% as a  suboptimal study showing some fixed inferior defect.  He could not  exclude mild reversibility involving the mid inferior and lateral walls.  The patient denies fevers, chills, or other constitutional symptoms, but  is quite limited by his multiple comorbidities.  He does do some  ambulation with a walker.  Overall, his progress would be described as  slow.   PHYSICAL EXAMINATION:  Vital Signs:  Blood pressure sitting is 131/65,  pulse is 81 and regular, respirations 18 unlabored, and oxygen  saturation is 91% on room air.  General Appearance:  This is an elderly,  chronically ill-appearing male in no acute distress.  Pulmonary:  Slightly diminished breath sounds in the basis.  Cardiac:  Regular rate  and rhythm.  Positive S3 gallop.  No murmurs, no rubs.  Incisions are  inspected and healing well without evidence of infection.  Extremities:  Bilateral lower extremity pitting edema.   ASSESSMENT:  The patient is making slow recovery.  He will need further  maximization of his medical therapies.  He is scheduled to see  Nephrology this week and Cardiology next week.  I have also encouraged  him to follow up with his urologist to see if we can stop his oral  Flomax as it may be contributing to his orthostatic blood pressures.  I  am hesitant to stop or diminish his Lasix at this point.  Currently, he  is on 40 b.i.d.  Note:  Chest x-ray obtained today's date did show some  congestive heart failure findings with mild vascular fullness.  From a  surgical viewpoint, he is felt to be making good recovery.  We will see  him again on a p.r.n. basis for any surgically-related issues or at the  request of any of his consultants.   Rowe Clack, P.A.-C.   Sherryll Burger  D:  05/17/2009  T:  05/18/2009  Job:  [id number]   cc:   [name], MD  Wilber Bihari. [name], M.D.

## 2010-07-05 NOTE — Procedures (Signed)
NAME:  John [name], FAIRLEY NO.:  [id number]   MEDICAL RECORD NO.:  [id number]          PATIENT TYPE:  OIB   LOCATION:  6533                         FACILITY:  [facility]   PHYSICIAN:  Verne Carrow, MDDATE OF BIRTH:  [birth year]   DATE OF PROCEDURE:  01/12/2010  DATE OF DISCHARGE:                    PERIPHERAL VASCULAR INVASIVE PROCEDURE    PRIMARY CARE PHYSICIAN:  Larina Earthly, MD   PRIMARY CARDIOLOGIST:  [name], MD   PROCEDURES PERFORMED:  1. Selective right lower extremity angiogram.  2. SilverHawk atherectomy of the distal right superficial femoral      artery and right popliteal artery.   OPERATOR:  Verne Carrow, MD   INDICATIONS:  This is a 70 year old Caucasian male with a history of  coronary artery disease status post CABG, renal transplantation,  hypertension, diabetes mellitus, and peripheral vascular disease who is  here today for planned peripheral vascular procedure.  I saw the patient  in my office on November 30, 2009 at which time he had complaints of  severe cramping in his right calf muscle when walking.  We performed a  diagnostic angiogram on December 29, 2009.  The patient was found to have  severe stenosis in the distal right superficial femoral artery and in  the right popliteal artery.  We elected not to proceed to intervention  that day as the patient has chronic renal insufficiency with a  transplanted single kidney.   DETAILS OF PROCEDURE:  The patient was brought to the main peripheral  vascular laboratory after signing informed consent for the procedure.  The left groin was prepped and draped in sterile fashion.  Lidocaine 1%  was used for local anesthesia.  A 5-French sheath was inserted into the  left femoral artery without difficulty.  We then used a crossover  catheter and advanced our Versacore wire into the right common femoral  artery.  At this point, we attempted to pass the long 7-French  Destination sheath  around the aortic bifurcation but we were unable to  pass the sheath.  We then reinserted the Crossover catheter and  exchanged out for a Stiff Amplatz wire.  At this point, the 7-French  Destination sheath was carried around the aortic bifurcation and  positioned into the right common femoral artery.  The patient was given  5000 units of heparin.  An ACT came back at 160.  He was given  additional 3000 units of heparin.  At this point, a Glidewire was  advanced through the sheath and down into the right superficial femoral  artery and into the popliteal artery into the tibialis.  Quick-Cross  catheter was used to cross the lesions.  At this point, a 6.0 Spyder  filter was advanced through the exchange catheter and positioned in the  distal popliteal artery.  The ACT came back at 190.  The patient was  given an additional 3000 units of heparin.  ACT following this was 230.  At this point, we used the LSC-SilverHawk atherectomy device and made 5  passes through the mid superficial femoral artery and 4 passes through  the popliteal lesion.  There was an excellent angiographic  result.  The  patient tolerated the procedure well.  The stenosis was taken from 80%  to 10% in the distal right superficial femoral artery.  The stenosis was  taken from 95% to 10% in the right popliteal artery.  There was  excellent distal runoff following the procedure.  The patient was taken  to the holding area in stable condition.   HEMODYNAMIC FINDINGS:  Central aortic pressure 141/70.   ANGIOGRAPHIC FINDINGS:  The right superficial femoral artery had diffuse  calcification and a segment of 80% calcified stenosis in the distal  portion of the vessel.  There was a 95% stenosis in the right popliteal  artery.  The distal portion of the right popliteal artery was disease  free.  The anterior tibial artery had a total occlusion at the proximal  portion.  This plaque had a takeoff of the anterior tibial arteries  and  the popliteal artery.  The tibial peroneal trunk was relatively disease  free.  There was two-vessel runoff to the right foot via the peroneal  and posterior tibial arteries.   IMPRESSION:  1. Severe stenosis in the distal right superficial femoral artery and      in the right popliteal artery.  2. Successful atherectomy in both locations in the distal right      superficial femoral artery and the popliteal artery.   RECOMMENDATIONS:  The patient was loaded with 600 mg Plavix here in the  peripheral vascular laboratory.  We will continue aspirin and Plavix on  a daily basis.  We will hydrate the patient for 6 hours following the  procedure.  We will discharge him home today after his bed rest and  hydration.  We will plan on the followup metabolic profile in our office  in 1 week.      Verne Carrow, MD      CM/MEDQ  D:  01/12/2010  T:  01/12/2010  Job:  [id number]   cc:   Larina Earthly, M.D.  [name], MD   Electronically Signed by Verne Carrow MD on 01/14/2010 03:41:28 PM

## 2010-07-05 NOTE — Procedures (Signed)
NAME:  John [name], John [name]              ACCOUNT NO.:  [id number]   MEDICAL RECORD NO.:  [id number]          PATIENT TYPE:  AMB   LOCATION:  SDS                          FACILITY:  [facility]   PHYSICIAN:  Verne Carrow, MDDATE OF BIRTH:  Dec 28, 1940   DATE OF PROCEDURE:  12/29/2009  DATE OF DISCHARGE:  12/29/2009                    PERIPHERAL VASCULAR INVASIVE PROCEDURE    PRIMARY CARE PHYSICIAN:  Larina Earthly, MD   PRIMARY CARDIOLOGIST:  [name], MD   PROCEDURE PERFORMED:  1. Distal aortogram with bilateral lower extremity runoff.  2. Arterial access to the right femoral artery.   OPERATOR:  Verne Carrow, MD   INDICATIONS:  This is a 70 year old Caucasian male with a history of  known peripheral vascular disease as well as prior kidney  transplantation who has had recent complaints of claudication in the  right lower extremity.  His noninvasive studies performed in the office  suggested focal stenosis in the right superficial femoral artery as well  as right popliteal artery.  Diagnostic testing was arranged for today  with a lower extremity runoff.   DETAILS OF PROCEDURE:  The patient was brought to the main peripheral  vascular laboratory after signing informed consent for the procedure.  The right groin was prepped and draped in sterile fashion.  Lidocaine 1%  was used for local anesthesia.  A 5-French sheath was inserted into the  right femoral artery without difficulty.  A Versacore wire was advanced  up into the distal aorta.  A pigtail catheter was advanced over the wire  and was positioned in the distal aorta.  We then performed an angiogram  of the distal aorta.  The pigtail catheter was pulled down to the level  of the aortic bifurcation and several angulated pelvic angiograms were  performed.  We then selectively entered the left iliac system with a  crossover catheter and a glidewire.  An end-hole catheter was advanced  over the wire and was positioned  in the distal portion of the left  external iliac artery.  We then performed runoff of the left lower  extremity through this catheter.  We then performed a pressure pullback  through the diffuse disease in the left iliac artery system with the end-  hole catheter.  There was no significant pressure gradient noted.  The  catheter was removed and runoff of the right lower extremity was  performed through the sheath that was in place in the right femoral  artery.  The patient tolerated the procedure well, was taken to the  holding area in stable condition.  Of note, we elected to stage his  intervention with his chronic renal insufficiency and his history of a  transplanted single kidney.   HEMODYNAMIC FINDINGS:  Central aortic pressure 165/85.   ANGIOGRAPHIC FINDINGS:  1. The distal aorta had diffuse plaque but no obstructive lesions and      no aneurysmal segment.  The transplanted right kidney was noted to      be in the right pelvis.  2. The right common iliac artery had 30% stenosis.  There was diffuse      plaque throughout the right  external iliac artery and right      internal iliac artery.  The right common femoral artery had diffuse      plaque and calcification but no focally obstructive lesions.  The      right superficial femoral artery had diffuse 30% to 40% calcified      plaque throughout the proximal portion.  There was a long segment      of 70% to 80% calcified stenosis in the midportion of the vessel.      The distal portion of SI calcified nonobstructive plaque.  The      right popliteal artery had a focal 90% to 95% calcified stenosis.      The distal portion of the right popliteal artery was disease free.      The anterior tibial artery had a total occlusion in the proximal      portion.  There was a plaque at the takeoff of the anterior tibial      artery from the popliteal artery.  The tibioperoneal trunk was      relatively disease free.  There was two-vessel  runoff to the right      foot via the peroneal and posterior tibial arteries.  3. The left common iliac artery had an ostial 40% stenosis.  The left      external iliac artery had diffuse 40% to 50% stenosis.  The left      common femoral artery had plaque disease.  The left superficial      femoral artery had diffuse 40% to 50% calcified plaque throughout      the proximal, mid, and distal portion of the vessel.  There did not      appear to be any focally obstructive lesions in this vessel.  The      left popliteal artery was relatively disease free.  The left      anterior tibial artery was occluded proximally.  There is two-      vessel runoff to the left foot via the peroneal artery and      posterior tibial artery.   IMPRESSION:  Severe peripheral vascular disease with focally obstructive  lesions in the right superficial femoral artery and right popliteal  artery.  Diffuse disease throughout the left superficial femoral artery.  Two-vessel runoff to each feet via the peroneal artery and posterior  tibial artery with occlusions in the anterior tibial artery in both  legs.   RECOMMENDATIONS:  We will load the patient with Plavix today and  continue aspirin and Plavix on a daily basis.  We will plan on bringing  him back in several weeks to perform SilverHawk atherectomy on the areas  in the right superficial femoral artery and right popliteal artery.  We  are staging the procedure because of the patient's chronic renal  insufficiency.  The patient will be hydrated aggressively today for 6  hours and we will allow him to go home tonight.      Verne Carrow, MD      CM/MEDQ  D:  12/29/2009  T:  12/29/2009  Job:  [id number]   cc:   Larina Earthly, M.D.  [name], MD   Electronically Signed by Verne Carrow MD on 12/30/2009 07:46:32 AM

## 2010-07-05 NOTE — Progress Notes (Signed)
COPY ROUTED TO DR. MCLEAN.John [name] ° ° °

## 2010-07-07 ENCOUNTER — Encounter: Payer: Self-pay | Admitting: Cardiology

## 2010-07-08 NOTE — H&P (Signed)
NAME:  John [name], John [name] NO.:  [id number]   MEDICAL RECORD NO.:  [id number]                  PATIENT TYPE:   LOCATION:                                       FACILITY:   PHYSICIAN:  [id number]                                DATE OF BIRTH:  March 07, 1940   DATE OF ADMISSION:  11/10/2001  DATE OF DISCHARGE:                                HISTORY & PHYSICAL   HISTORY OF PRESENT ILLNESS:  The patient is a 70 year-old male with a  history of end-stage renal disease and diabetes who has also been followed  in this office for claudication (right greater than left) since January of  this year.  He says that lately during the past three months his symptoms  are increased.  He walks sometimes only 50 to 100 yards before symptoms  appear.  They appear in both calves, but sometimes from the left hip down.  Currently, he says the left lower extremity hurts greater than the right. It  is apparently very important to him in sustaining his health that he  maintain his walking regimen since he has a transplanted kidney.  His  claudication symptoms are interfering with this.   PAST MEDICAL HISTORY:  1. Infrainguinal arterial occlusive disease.  2. 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  3. Hypertension.  4. Clinical depression.  5. End-stage renal disease.  6. Dyslipidemia.   He denies any prior history of myocardial infarction, congestive heart  failure, deep venous thrombosis, cerebrovascular accident, transient  ischemic attack, peptic ulcer disease, nephrolithiasis.   PAST SURGICAL HISTORY:  1. 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  2. Status post right herniorrhaphy.  3. Status post peritoneal catheter implant in February 2002.   ALLERGIES:  No known drug allergies.   MEDICATIONS:  1. Prograf 1 mg tablets, 5 mg p.o. b.i.d.  2. CellCept 250 mg tablets two p.o. b.i.d.  3. Prednisone 5 mg daily.  4. Protonix 40 mg  daily.  5. Enteric coated aspirin 81 mg daily.  6. Trazodone 100 mg daily at bedtime.  7. Provigil 200 mg daily.  8. Labetalol 200 mg twice daily.  9. Norvasc 10 mg daily.  10.      K-Phos Neutral 250 mg two p.o. b.i.d.  11.      Magnesium oxide 400 mg three tablets p.o. b.i.d.  12.      Lantus 35 units subcutaneous at bedtime.  13.      Novolog per sliding scale.  14.      Lipitor 20 mg p.o. q.h.s.  15.      Zoloft 100 mg two tablets daily.  16.      Lasix 40 mg daily.  17.      Glucotrol XL 10 mg daily.  18.  Zetia 10 mg daily.   SOCIAL HISTORY:  The patient is married and he has three children in good  health. He only takes alcoholic beverages occasionally. He does not  currently smoke having quit 15 years ago.  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   FAMILY HISTORY:  Significant for breast cancer and coronary artery disease  in mother.  The patient's father had a history of TB, colon cancer and  mitral regurgitation.  He has one sister living who has had a heart attack.  One brother is healthy.   PHYSICAL EXAMINATION:  GENERAL:  This is an alert and oriented male.  He  talks rapidly.  He has a visible tremor affecting both extremities and jaw.  He is in no acute distress.  He is alert and oriented times three.  VITAL SIGNS:  Blood pressure is 135/74, pulse regular at 60, respirations  even at 20.  HEENT:  Head is normocephalic, atraumatic.  Eyes:  Pupils equal, round and  reactive to light.  Extraocular movements are intact.  Oropharynx:  He wears  upper dentures. He has no lesions.  Mucous membranes are moist and pink.  The neck is supple with no jugular venous distention and no bruits.  LUNGS:  Clear to auscultation and percussion bilaterally.  HEART:  Regular rate and rhythm without murmurs, rubs or gallops.  ABDOMEN:  Soft, non-tender and non-distended.  Bowel sounds are present  throughout. The abdominal  aorta is non-pulsatile.  There are no masses.  No  bruits.  EXTREMITIES:  Without cyanosis, clubbing, edema or ulcerations.  Temperature  is decreased below the knees.  Femoral pulses are 4/4 bilaterally.  He has  absent pulses in both popliteals and in the pedal regions bilaterally.  NEUROLOGIC:  He has a positive rest tremor which affects the neck, upper  extremities and mandible. It is secondary to his anti-rejection medications.  His gait is steady. His speech is clear.  Tongue is midline.  Swallow is  preserved.   IMPRESSION:  Bilateral lower extremity claudication.    PLAN:  The patient will be admitted on November 10, 2001 for hydration and  Mucomyst therapy per pharmacy.  He will undergo angiography of the left  lower extremity limited dye study on November 11, 2001 by Dr. Waverly Ferrari.     Maple Mirza, P.A.                    [phone]    GM/MEDQ  D:  11/08/2001  T:  11/11/2001  Job:  [id number]   cc:   Wilber Bihari. [name], M.D.  [address] [place]  [place] [address]  Fax: [phone]   Veverly Fells. Altheimer, M.D.  [address], [address]  [place]  [place] [address]  Fax: [phone]

## 2010-07-08 NOTE — Consult Note (Signed)
[place]  Patient:    John [name], John [name]                     MRN: [id number] Proc. Date: 03/29/00 Adm. Date:  [id number] Attending:  Henrene Dodge Dictator:   Nancee Liter, M.D.                          Consultation Report  CONSULTING PHYSICIAN:  Dyke Maes, M.D.  REASON FOR CONSULTATION:  Need for peritoneal dialysis.  IMPRESSION:  1. End-stage renal disease in need of dialysis.  2. Microcytic anemia.  3. Diabetes mellitus, type 2.  4. Hypertension.  5. Hyperlipidemia.  6. Depression.  7. A history of right inguinal hernia repair.  RECOMMENDATIONS:  1. Supine peritoneal dialysis overnight, exchange 1.5% glucose solution 1     liter q.4h.  2. Assess renal profile in a.m.  3. Guaiac stools.  4. Increase dose of Epogen to 10,000 units twice a week q. Tuesday and     Saturday.  5. infed 500 mg IV q.d. x 2.  6. Supplemental p.o. iron sulfate t.i.d. thereafter.  7. Continue phosphate binder, Vitamin D and Lasix.  HISTORY OF PRESENT ILLNESS:  Mr. Ickes is a 70 year old Caucasian male with a history of insulin-dependent diabetes mellitus and hypertension who was found to have an elevated serum creatinine on routine laboratory data.  The patient was referred to [facility] for evaluation approximately 2 weeks ago.  The patient has had mild fatigue but no other symptoms of uremia.  The patient was found to be anemic at the time of his annual physical as well.  The patient denies any bright red blood per rectum, melena, epistaxis or other bleeding.  The patient denies any history of anemia, coagulopathy, or blood disorder.  The patient reports he has had annual fecal occult blood tests that were negative as well as screening flexible sigmoidoscopies due to a family history of colon cancer in his father in his 62s.  The patient is seen postoperatively and is doing well.  The peritoneal catheter is placed  without complications.  The patients CBG was low at 45 but D-5 normal saline and p.o. intake resulted in a CBG of 79.  PAST MEDICAL HISTORY:  1. Diabetes mellitus, type 2.     a. Diabetic retinopathy.     b. Chronic renal insufficiency with creatinine over 3.5 approximately 1        year ago.  2. Hypertension.  3. Depression.  4. History of right inguinal hernia repair.  MEDICATIONS:  1. Humulin N 4 units subcutaneous q.a.m. q.p.m.  2. Lipitor 20 mg q.d.  3. Norvasc 10 mg q.d.  4. Wellbutrin 150 mg p.o. b.i.d.  5. Zoloft 100 mg 1-1/2 p.o. q.d.  6. Lasix 80 mg p.o. b.i.d.  7. Aspirin 81 mg p.o. q.d.  8. Calcitriol 0.25 mg q.d.  9. Tums 500 1 p.o. q. meal. 10. Vitamin E and Viagra 100 mg p.o. p.r.n.  ALLERGIES:  No known drug allergies.  PHYSICAL EXAMINATION:  VITAL SIGNS:  Blood pressure 166/73, pulse 77, respirations 12 with 100% saturation on room air.  Weight 200 pounds.  Height 73 inches.  GENERAL:  He is a well-nourished well-developed mildly obese gentleman in no apparent distress.  HEENT:  Pupils, equal, round, reactive to light and accommodation.  Sclerae mildly dusky, conjunctivae not injected.  Oropharynx edentulous on the upper plate.  Mucous  membranes pink and dry.  NECK:  No lymphadenopathy.  No JVD.  No masses.  HEART:  Regular rate and rhythm.  LUNGS:  Clear to auscultation bilaterally.  No rhonchi, rales, or wheezes.  ABDOMEN:  Catheter bandage clean, dry, and intact.  Abdomen soft, nontender, nondistended.  EXTREMITIES:  Pulses are 1+, dorsalis pedis and posterior tibial bilaterally. No edema.  SKIN:  No rashes.  DISCUSSION:  The patients care was discussed with Dr. Zachery Dakins and Dr. Briant Cedar.  We will proceed with the peritoneal dialysis overnight to see if the right inguinal hernia is patent and to see if the dialysis is effective.  The patient expressed understanding that following this trial he would need approximately 2 weeks to allow  his catheter to heal before performing daily peritoneal dialysis.  We have discussed the option of hemodialysis twice a week for minimal interference with his work schedule, however, at this time we will proceed with the plan to do peritoneal dialysis unless complications arise. DD:  03/29/00 TD:  03/30/00 Job: [id number]

## 2010-07-08 NOTE — H&P (Signed)
[place]. [facility]  Patient:    John [name], John [name]                     MRN: [id number] Adm. Date:  [id number] Attending:  Delano Metz D                         History and Physical  CHIEF COMPLAINT: Fever, low blood pressure.  HISTORY OF PRESENT ILLNESS: The patient is a 70 year old white male with long-standing type 2 diabetes, who had developed progressive renal failure requiring initiation of dialysis in February 2002.  He currently presents with a two to three day history of fever, low blood pressure at home, and nausea and vomiting several times over the last 24 hours.  He denies any other focal symptoms other than myalgias.  He does have a mild left frontal headache also. He has no cough, abdominal pain, and peritoneal fluid has not been cloudy.  He has not had any diarrhea or upper respiratory symptoms.  The patient also notes decrease in urine output over the last several weeks, a problem with cloudy pinking over the past few weeks, and low energy.  Appetite has been adequate.  He has had occasional nausea and vomiting over the past few months prior to this current illness.  PAST MEDICAL HISTORY:  1. ESRD due to diabetes mellitus.  The patient is on CCPD, 10 liters per     night.  He does one exchange around dinner time of 2000 cc and then hooks     up to the cycler around 10 p.m. for four exchanges of 2000 cc, and leaves     the last exchange in for a day bag.  2. Diabetes mellitus of over 20 years duration, complicated by retinopathy,     neuropathy, and nephropathy.  3. Hypertension.  4. 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  Had MRI done this week.  5. Anemia due to renal failure.  6. History of depression over five years.  PAST SURGICAL HISTORY:  1. Left inguinal hernia repair, remote.  2. Cataracts and laser surgery for diabetic retinopathy.  3. Tenckhoff catheter in February 2002  by Dr. Zachery Dakins.  CURRENT MEDICATIONS:  1. NPH 30-40 units b.i.d.  2. Lasix 80 mg q.d.  3. Zoloft 150 mg q.d.  4. Wellbutrin 150 mg b.i.d.  5. Norvasc recently decreased from 10 mg to 5 mg q.d. due to low blood     pressure.  6. Lipitor 20 mg q.d.  7. Procrit 10,000 units weekly.  8. Iron.  9. Tums two with meals.  ALLERGIES: No known drug allergies.  SOCIAL HISTORY: The patient is married and lives with his wife.  No alcohol, tobacco, or drugs.  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  REVIEW OF SYSTEMS: Noncontributory.  PHYSICAL EXAMINATION:  VITAL SIGNS: Blood pressure 90/50 initially, now 116/70 after IV fluids. Temperature 100.8 degrees, up to 103 degrees in emergency room.  GENERAL: This is a middle-aged male, somewhat toxic.  He does have heavy uremic fetor.  He is not in severe distress.  SKIN: No rash or purpura.  HEENT: PERRL.  EOMI.  Throat clear and moist.  NECK: Supple, with nondistended neck veins and no lymphadenopathy.  CHEST: Clear to bases throughout bilaterally.  CARDIAC: Regular rate and rhythm without murmurs, rubs, or gallops.  ABDOMEN: Soft, nontender.  Active  bowel sounds.  Right lower quadrant catheter exit site has mild erythema and crusting, but no pus, and the tunnel is not boggy or tender.  EXTREMITIES: No joint swelling or edema.  NEUROLOGIC: Alert and oriented x 3 with no asterixis.  There is a mild tremor on the left.  LABORATORY DATA: Sodium 132, potassium 3.8, BUN 67, creatinine 12.  WBC 18.9 with increased bands, hematocrit 32%, platelets normal.  Albumin 2.7.  Liver enzymes normal.  Calcium 8.5.  Abdominal x-ray shows nonspecific bowel gas pattern.  Chest x-ray is underexposed, no acute disease according to radiologist; by my reading there are some increased markings at both bases which may be due to technique or early infiltrate.  IMPRESSION:  1. High fever with nausea and vomiting.  No evidence of peritonitis      clinically.  Awaiting peritoneal fluid studies.  Chest x-ray may show some     increased markings at the bases.  Possible early atypical pneumonia,     possible viral gastrointestinal syndrome.  Will admit, give intravenous     fluids secondary to volume depletion and low blood pressure, and treat     with empiric antibiotics after blood cultures.  The patient is not voiding     so urine culture cannot be obtained.  2. End-stage renal disease.  Patient appears uremic and this is not     surprising due to his size and difficulty with dialyzing large frame     people with peritoneal dialysis.  Dr. [name] states in his notes he would     prefer hemodialysis in this patient.  Will need to re-evaluate this.   For     now patient medical history unavailable.  3. Hypotension.  Hold Norvasc and Lasix and can probably DC.  4. Diabetes mellitus.  5. Anemia, on Epogen and just finished a course of outpatient intravenous     InFeD. DD:  06/23/00 TD:  06/25/00 Job: [id number]

## 2010-07-08 NOTE — Op Note (Signed)
NAME:  John [name], John [name]                        ACCOUNT NO.:  [id number]   MEDICAL RECORD NO.:  [id number]                   PATIENT TYPE:  INP   LOCATION:  5004                                 FACILITY:  [facility]   PHYSICIAN:  Di Kindle. Edilia Bo, M.D.        DATE OF BIRTH:  Sep 23, 1940   DATE OF PROCEDURE:  DATE OF DISCHARGE:                                 OPERATIVE REPORT   PROCEDURE:  1. Aortogram.  2. Bilateral iliac arteriogram.  3. Left lower extremity runoff.   SUBJECTIVE:  Di Kindle. Edilia Bo, M.D.   ANESTHESIA:  Local with sedation.   INDICATIONS FOR PROCEDURE:  This is a 70 year old gentleman who I have been  following with bilateral lower extremity claudication.  When I initially saw  him in January of 2003, I recommended a structured walking program.  I saw  him back in early September and he stated that over the last three months  his symptoms have progressive significantly.  He experiences pain in both  calves essentially by ambulation and relieved with rest.  He gives no  history of rest pain or nausea or ulcers. His symptoms were essentially the  same on both sides.  He had undergone a previous cadaveric renal transplant  and wanted to lower the risk of having kidney problems from the dye. He was  admitted overnight for hydration and we decided only to study one leg at a  time.  He was brought for diagnostic arteriography on the left side.   TECHNIQUE:  The patient was taken to the catheterization lab and sedated  with 1 mg of Versed and 100 mcg of Fentanyl.  Both groins were prepped and  draped in the usual sterile fashion.  After the skin was anesthetized with  1% Lidocaine, the left common femoral artery was cannulated and the  guidewire introduced into the infrarenal aorta under fluoroscopic control.  A 5 French sheath was passed over the wire.  A pigtail catheter was  positioned at the L1 vertebral body and flush aortogram obtained.  The  catheter was  then removed over a wire and left lower extremity runoff film  was obtained using a bolus and chase technique.  Next, two subtractions  films were obtained of the left leg.   At the conclusion of the procedure the sheath was removed and pressure held  for hemostasis.  There were no immediate complications noted.   FINDINGS:  There are single renal arteries bilaterally.  The right renal  artery originates significantly lower than the left renal artery.  There is  mild diffuse atherosclerotic disease of the infrarenal aorta with no focal  stenosis identified.  In addition there is mild diffuse disease of the  common iliac arteries and external iliac arteries bilaterally but again no  focal stenosis is identified.  Both hypogastric arteries are opened.  The  transplant kidney was visualized and appears to be well perfused.   On the left  side there is mild diffuse disease of the common femoral artery  and no high grade stenosis is noted.  There is an approximately 30% of the  stenosis of the common femoral artery on the left.  The deep femoral artery  is patent.  There is mild diffuse disease of the superficial femoral artery  but no focal stenosis.  Likewise, the popliteal artery is widely patent.  The anterior tibial artery is occluded proximally.  The tibial peroneal  trunk and peroneal artery are widely patent.  The posterior tibial artery is  small and diseased proximally but is good size distally.  There is some  collateral filling of the peroneal to the distal posterior tibial artery.  The anterior tibia and dorsalis pedis arteries are occluded.  The posterior  tibial runs off nicely into the foot.   CONCLUSION:  1. Mild aortoiliac occlusive disease with mild superficial femoral artery     and popliteal artery occlusive disease on the left.  2. Moderate tibial occlusive disease as described above.                                               Di Kindle. Edilia Bo, M.D.     CSD/MEDQ  D:  11/11/2001  T:  11/12/2001  Job:  [id number]   cc:   Wilber Bihari. [name], M.D.  [address] [place]  [place] [address]  Fax: [phone]

## 2010-07-08 NOTE — Discharge Summary (Signed)
   NAME:  John [name], LEVELS                        ACCOUNT NO.:  [id number]   MEDICAL RECORD NO.:  [id number]                   PATIENT TYPE:  INP   LOCATION:  5004                                 FACILITY:  [facility]   PHYSICIAN:  Di Kindle. Edilia Bo, M.D.        DATE OF BIRTH:  Aug 04, 1940   DATE OF ADMISSION:  11/10/2001  DATE OF DISCHARGE:  11/11/2001                                 DISCHARGE SUMMARY   DISCHARGE DIAGNOSIS:  Bilateral lower extremity claudication, left greater  than right, secondary to severe infrarenal arterial occlusive disease.  Admitted for IV hydration,   SECONDARY DIAGNOSIS:  1. Insulin-dependent diabetes mellitus, 20-year history.  2. Hypertension.  3. Clinical depression.  4. End-stage renal disease.  5. Status post living related donor renal transplant in July of 2002.  6. Status post herniorrhaphy.  7. Status post peritoneal catheter implant in February of 2002.   PROCEDURES:  Bilateral iliac aortogram with left lower extremity runoff by  Di Kindle. Edilia Bo, M.D.   HOSPITAL COURSE:  Admitted for IV hydration, Mucomyst therapy, and then for  angiography of the left lower extremity on November 11, 2001.  The patient  tolerated the procedure well and was transferred stable to short stay for a  six-hour recovery period.  The patient's pain was well controlled with oral  medications at that time.  He did not experience any evidence of swelling or  tenderness in the areas where the catheters were inserted.  He was admitted  on the afternoon of November 10, 2001, for IV hydration, as well as  Mucomyst treatment.  The patient was discharged the day of aortogram in  satisfactory and stable condition.     Maple Mirza, P.A.                    Di Kindle. Edilia Bo, M.D.    GM/MEDQ  D:  12/06/2001  T:  12/08/2001  Job:  [id number]

## 2010-07-08 NOTE — Discharge Summary (Signed)
[place]. [facility]  Patient:    John [name], John [name]                     MRN: [id number] Adm. Date:  [id number] Disc. Date: [id number] Attending:  Maree Krabbe Dictator:   Wolfgang Phoenix, P.A. CC:         [facility], [facility]   Discharge Summary  DISCHARGE DIAGNOSES: 1. Fever/nausea and vomiting, culture negative. 2. End-stage renal disease on continuous cycling peritoneal dialysis. 3. Diabetes mellitus. 4. Hypertension. 5. Hyperlipidemia.  BRIEF HISTORY AND REASON FOR ADMISSION:  [name] is a 70 year old white male with longstanding type 2 diabetes, who developed progressive renal failure requiring initiation of dialysis in February of 2002.  He had presented at admission with a two to three-day history of fever, low blood pressure, nausea, and vomiting over the previous 24 hours.  He denies any other focal symptoms other than myalgias.  He denies cough and abdominal pain and peritoneal fluid has not been cloudy.  He states that he has not had any diarrhea or upper respiratory symptoms.  He notes a decrease in urine output over the last several weeks and complains of low energy.  Appetite has been adequate.  He has had occasional nausea and vomiting over the past few  months prior to his current illness.  PHYSICAL EXAMINATION:  The blood pressure was 90/50 with a temperature of 100.8 degrees that rose to 103 degrees in the emergency room.  CHEST:  Clear bases bilaterally.  HEART:  Regular rate and rhythm without murmurs, rubs, or gallops.  ABDOMEN:  Soft and nontender with active bowel sounds.  The right lower quadrant catheter exit site was mildly erythematous and crusting, but did not reveal any pus and the tunnel was not tender.  EXTREMITIES:  No edema.  LABORATORY DATA:  Sodium 132, potassium 3.8, BUN 67, creatinine 12.  White blood cell count 18,900 with increased bands, hematocrit 32%, and normal platelets.   The albumin was 2.7.  Liver enzymes normal.  Calcium 8.5.  Abdominal x-rays showed nonspecific bowel gas pattern.  The chest x-ray revealed no acute disease.  HOSPITAL COURSE:  He was admitted for pan-culture, empiric antibiotic therapy, and rehabilitation.  #1 - FEVER, NAUSEA, AND VOMITING:  [name] continued to spike fevers during his hospitalization.  Initially he was given a liter of normal saline for volume repletion.  The peritoneal fluid culture, blood cultures, and chest x-ray were obtained and were all negative.  Stool was also sent for Clostridium difficile, which was negative.  His symptoms lessened during the hospitalization, however, he continued to have fevers in the 99-100 degree range.  Vancomycin was later discontinued and he was maintained on Tequin alone.  His leukocytosis resolved at discharge with a white cell count of 9000.  He was felt stable for discharge on Jun 27, 2000, to be discharged home on Tequin with close outpatient follow-up.  #2 - END-STAGE RENAL DISEASE ON CONTINUOUS CYCLING PERITONEAL DIALYSIS: [name] continued on CCPD during this hospitalization.  Predominantly 1.5% bags were used.  He did have some problems with hypokalemia and potassium was added to his peritoneal dialysis bags.  In addition, he was started on K-Dur.  The potassium on the date of discharge was 3.6.  #3 - DIABETES MELLITUS:  The patient was maintained on NPH 30 units subcu b.i.d.  He had fair control of his diabetes with most Accu-Cheks less than 200.  #4 -  HYPERTENSION:  Prior to admission, [name] had been on Lasix and Norvasc.  Both of these medications were held on admission secondary to hypotension.  He was restarted on his Norvasc 5 mg p.o. q.d. later in the hospitalization.  At discharge, the systolic average was 130.  He will continue to hold his Lasix at discharge.  DISCHARGE MEDICATIONS:  1. Nephro-Vite one p.o. q.d.  2. Calcium 500 mg two tablets  t.i.d. with meals.  3. Wellbutrin 150 mg b.i.d.  4. NPH Insulin 30 units b.i.d.  5. Lipitor 20 mg q.d.  6. Zoloft 50 mg q.d.  7. Ferrous sulfate 325 mg t.i.d.  8. K-Dur 40 mEq p.o. q.d.  9. Tequin 200 mg p.o. q.d. through Jul 03, 2000. 10. Norvasc 5 mg p.o. q.d.  DISCHARGE INSTRUCTIONS:  Hold Lasix.  [name] is to follow a 4 g sodium, low-phosphorus diet with diabetic restrictions.  He should receive Epogen 10,000 unit subcu week.  He is to follow up for laboratory work at home training.  DISCHARGE CONDITION:  Improved.  DISPOSITION:  Discharged home. DD:  08/14/00 TD:  08/14/00 Job: [id number]

## 2010-07-08 NOTE — Op Note (Signed)
[place]. [facility]  Patient:    John [name], John [name]                     MRN: [id number] Proc. Date: 03/29/00 Adm. Date:  [id number] Attending:  Henrene Dodge CC:         Wilber Bihari. [name], M.D.   Operative Report  PREOPERATIVE DIAGNOSIS:  End-stage renal disease secondary to diabetes mellitus, desires chronic ambulatory peritoneal dialysis.  POSTOPERATIVE DIAGNOSIS:  End-stage renal disease secondary to diabetes mellitus, desires chronic ambulatory peritoneal dialysis .  PROCEDURE:  Placement of [place] double-cuff coil chronic ambulatory peritoneal dialysis catheter.  ANESTHESIA:  Local anesthesia with sedation.  SURGEON:  Anselm Pancoast. Zachery Dakins, M.D.  ASSISTANT:  Nurse.  HISTORY:  John [name] is a 70 year old male referred to me by Dr. [name] for management of end-stage renal disease, hopefully with peritoneal dialysis. Dr. [name] has questioned whether the patient had a ventral hernia.  The patient has had a 20-year history of diabetes but recently on physical exam he was feeling poorly and was noted to have a marked elevated creatinine and anemic. He is being evaluated by the renal doctors, and they feel that he definitely needs to start on dialysis.  On physical exam, could not find any obvious ventral hernia but questioned whether he might have a recurrent right inguinal hernia in the right groin that is not symptomatic, and on discussing this with Dr. [name], we elected to proceed with catheter placement to see if he could be started on supine peritoneal dialysis.  The patient is also quite anemic and will receive injections for this to hopefully avoid transfusion.  The patient preoperatively was given 1 g of Kefzol and his glucose this morning was 49, but he is asymptomatic.  He had his regular insulin last night, his Humulin N 40, but not this morning.  His hematocrit is 22, and I think we can proceed on with this procedure, since it should  be a local minimal procedure.  DESCRIPTION OF PROCEDURE:  The patient was taken to the operative suite, a small amount of sedation administered.  The abdomen was prepped with Betadine surgical scrub and solution and draped in a sterile manner.  A small area slightly below and to the right of the umbilicus was first shaved, then prepped, then infiltrated with Xylocaine mixed with Marcaine with adrenalin, and then a small vertical opening incision made down through the skin and subcutaneous, Scarpas, to identify the anterior rectus fascia.  It was opened vertically, the rectus muscle split in the direction of the fibers, exposing the posterior rectus fascia and peritoneum.  This was anesthetized with about 1 cc of Xylocaine and then picked up between two hemostats, a small opening made through both layers, three hemostats placed, two pursestring sutures of 2-0 Vicryl placed, and then the coiled [place] catheter with guidewire was inserted in the lower abdomen.  The pursestring suture was tied so that the Silastic ball was in the peritoneal cavity.  The internal cuff was lying flat on the posterior rectus fascia.  The rectus muscle was approximated with 3-0 chromic.  I closed the anterior rectus fascia with interrupted sutures of 2-0 Prolene.  The catheter was then tunneled subcutaneously to exit in the right lower quadrant.  The subcutaneous wounds closed with 3-0 chromic, 5-0 nylon on the skin.  The catheter flushes easily, return is clear, and I think we can start supine peritoneal dialysis this evening.  He will receive  the medication for his anemia and hopefully will be able to start peritoneal dialysis without developing swelling in the scrotum because of the weakness in the right groin. D:  03/29/00 TD:  03/30/00 Job: [id number]

## 2010-07-12 ENCOUNTER — Encounter: Payer: Self-pay | Admitting: Cardiology

## 2010-07-12 ENCOUNTER — Ambulatory Visit (INDEPENDENT_AMBULATORY_CARE_PROVIDER_SITE_OTHER): Payer: Medicare Other | Admitting: Cardiology

## 2010-07-12 DIAGNOSIS — I25810 Atherosclerosis of coronary artery bypass graft(s) without angina pectoris: Secondary | ICD-10-CM

## 2010-07-12 DIAGNOSIS — I5032 Chronic diastolic (congestive) heart failure: Secondary | ICD-10-CM

## 2010-07-12 DIAGNOSIS — I5022 Chronic systolic (congestive) heart failure: Secondary | ICD-10-CM

## 2010-07-12 DIAGNOSIS — R0602 Shortness of breath: Secondary | ICD-10-CM

## 2010-07-12 DIAGNOSIS — R5381 Other malaise: Secondary | ICD-10-CM

## 2010-07-12 DIAGNOSIS — I7389 Other specified peripheral vascular diseases: Secondary | ICD-10-CM

## 2010-07-12 DIAGNOSIS — R0989 Other specified symptoms and signs involving the circulatory and respiratory systems: Secondary | ICD-10-CM

## 2010-07-12 DIAGNOSIS — R0609 Other forms of dyspnea: Secondary | ICD-10-CM

## 2010-07-12 DIAGNOSIS — R5383 Other fatigue: Secondary | ICD-10-CM

## 2010-07-12 MED ORDER — PYRIDOSTIGMINE BROMIDE 60 MG PO TABS: 60.0000 mg | ORAL_TABLET | Freq: Two times a day (BID) | ORAL | Status: DC

## 2010-07-12 MED ORDER — FUROSEMIDE 40 MG PO TABS: ORAL_TABLET | ORAL | Status: DC

## 2010-07-12 MED ORDER — ASPIRIN EC 81 MG PO TBEC: 81.0000 mg | DELAYED_RELEASE_TABLET | Freq: Every day | ORAL | Status: AC

## 2010-07-12 NOTE — Progress Notes (Signed)
PCP: Dr. [name]  70 yo with history of renal transplant, DM, HTN, and CAD s/p CABG returns for followup.  Patient had a long and complicated hospital admission in 1/11.  Patient was admitted to [facility] at that time with a UTI and pulmonary edema.  Troponin was noted to be elevated and the patient had a nuclear scan with a large fixed inferior defect.  He ended up having a cardiac catheterization showing severe disease involving the LM, LAD, ramus, CFX and RCA.  He had CABG x 5.  Post-op course was complicated by pulmonary edema requiring intubation, renal failure requiring CVVH, and atrial fibrillation.  He was in the hospital about 7 weeks.  In 11/11, patient had right SFA and popliteal atherectomy that was complicated by a groin pseudoaneurysm.   Since last appointment, patient has been more short of breath.  He saw Tereso Newcomer and was set up for ETT-myoview.  This had to be converted to [facility] given poor exercise tolerance and severe dyspnea.  EF was normal, but there was a partially reversible inferior defect.  Patient gets winded with moderate activity such as walking up steps or walking fast.  He tried to move some furniture at home and noted very severe dyspnea.  He does ok with mild activity like riding his exercise bike or walking slowly.  This dyspnea has gradually worsened since 1/12.  Lasix was not increased, and he is still taking 40 mg bid Lasix. Weight is actually down 4 lbs since prior appointment.  Finally, patient had a CXR showing moderate right > left loculated pleural effusion.    Patient's oxygen saturation was 95% at rest and 89% with ambulation.   Labs (9/11): LDL 71, HDL 45 Labs (12/11): K 4.9, creatinine 0.9, HCT 37.1  Labs (5/12): K 4.9, creatinine 0.9, BNP 157, HCT 36.4  Allergies (verified):  No Known Drug Allergies  Past Medical History: 1. Diabetes mellitus 2. CKD s/p renal transplant 2002 3. HTN 4. PAD: Recent atherectomy right SFA and right popliteal on  01/12/10.  Complicated by pseudoaneurysm.  5. Lumbar disc disease with low back pain 6. Diabetic gastroparesis 7. Prostate cancer s/p seed implantation in 1/06.  8. Erectile dysfunction 9. Tremor 10.  Obesity 11.  Diastolic CHF: Last echo (4/11) with inferior and septal hypokinesis, moderate LV hypertrophy, EF 45-50%, mild left atrial enlargement.  12.  CAD: Lexiscan myoview (2/11) with EF 44%,  fixed inferior defect.  Cath (2/11) with 60% distal left main, 90% ostial LAD involving D1, 95% ostial large ramus, probable significant ostial CFX stenosis, 90% PDA, 70% PLV.  Paitent had CABG with LIMA-LAD, SVG-D, sequential SVG-ramus and OM2, SVG-PLV.  Lexsican myoview (5/12) with EF 59%, partially reversible inferior defect.  13.  Atrial fibrillation post-op CABG 14.  Orthostatic hypotension: Some improvement with pyridostigmine.  15.  Low testosterone 16.  Loculated right>left pleural effusion  Family History: Noncontributory  Social History: Married, retired Clinical research associate, lives in [place].  Prior smoker.  Review of Systems        All systems reviewed and negative except as per HPI.   Current Outpatient Prescriptions  Medication Sig Dispense Refill  . Acetylcysteine (NAC 600 PO) Take by mouth 2 (two) times daily.        Marland Kitchen atorvastatin (LIPITOR) 40 MG tablet Take 40 mg by mouth daily.        . Calcium Carb-Cholecalciferol (CALCIUM 1000 + D PO) Take 1,000 mg by mouth daily.        Marland Kitchen  carbidopa-levodopa (SINEMET CR) 25-100 MG per tablet Take 1 tablet by mouth 3 (three) times daily.        . Cholecalciferol (VITAMIN D3) 1000 UNITS CAPS Take 10,000 mg by mouth. Twice a week.      . clopidogrel (PLAVIX) 75 MG tablet Take 75 mg by mouth daily.        . DULoxetine (CYMBALTA) 60 MG capsule Take 60 mg by mouth daily.        Marland Kitchen ezetimibe (ZETIA) 10 MG tablet Take 10 mg by mouth daily.        . Ferrous Sulfate (IRON) 325 (65 FE) MG TABS Take by mouth daily.        . folic acid (FOLVITE) 800 MCG tablet  Take 800 mcg by mouth daily.        . Insulin Aspart (NOVOLOG [place]) Inject into the skin. Via pump as directed        . LamoTRIgine (LAMICTAL ODT PO) Take by mouth as directed.        Marland Kitchen lisinopril (PRINIVIL,ZESTRIL) 2.5 MG tablet Take 2.5 mg by mouth daily.        . magnesium oxide (MAG-OX) 400 MG tablet Take 400 mg by mouth 2 (two) times daily.       . Multiple Vitamin (MULTIVITAMIN) tablet Take 1 tablet by mouth daily.        . mycophenolate (CELLCEPT) 500 MG tablet Take by mouth 2 (two) times daily.        . Omeprazole 20 MG TBEC Take by mouth daily.        Marland Kitchen oxyCODONE-acetaminophen (PERCOCET) 7.5-500 MG per tablet Take 1 tablet by mouth every 4 (four) hours as needed.        . predniSONE (DELTASONE) 5 MG tablet Take 5 mg by mouth daily.        Marland Kitchen pyridostigmine (MESTINON) 60 MG tablet Take 1 tablet (60 mg total) by mouth 2 (two) times daily.  60 tablet  6  . pyridostigmine (MESTINON) 60 MG tablet Take 1 tablet (60 mg total) by mouth 2 (two) times daily.  60 tablet  6  . sitaGLIPtan (JANUVIA) 100 MG tablet Take 100 mg by mouth daily.        . tacrolimus (PROGRAF) 5 MG capsule Take 5 mg by mouth 2 (two) times daily.        . Tamsulosin HCl (FLOMAX) 0.4 MG CAPS Take by mouth daily.        . temazepam (RESTORIL) 15 MG capsule Take 15 mg by mouth at bedtime as needed.        . Testosterone (ANDROGEL PUMP TD) Place onto the skin as directed.        . vitamin B-12 (CYANOCOBALAMIN) 500 MCG tablet Take 500 mcg by mouth daily.        Marland Kitchen DISCONTD: acetaminophen (TYLENOL) 325 MG tablet Take 650 mg by mouth as needed.        Marland Kitchen DISCONTD: aspirin 325 MG EC tablet Take 325 mg by mouth daily.        Marland Kitchen DISCONTD: furosemide (LASIX) 40 MG tablet Take 40 mg by mouth 2 (two) times daily.        Marland Kitchen DISCONTD: pyridostigmine (MESTINON) 60 MG tablet Take 60 mg by mouth 2 (two) times daily.        Marland Kitchen DISCONTD: pyridostigmine (MESTINON) 60 MG tablet Take 1 tablet (60 mg total) by mouth 2 (two) times daily.  60 tablet  6    . aspirin  EC 81 MG tablet Take 1 tablet (81 mg total) by mouth daily.  150 tablet  2  . furosemide (LASIX) 40 MG tablet Take 1 and 1/2 tablets twice a day  90 tablet  6  . furosemide (LASIX) 40 MG tablet Take 1 and 1/2 tablets twice a day  90 tablet  6  . DISCONTD: furosemide (LASIX) 40 MG tablet Take 1 and 1/2 tablets twice a day  90 tablet  6  . DISCONTD: rosuvastatin (CRESTOR) 10 MG tablet Take 10 mg by mouth daily.          BP 157/76  Pulse 78  Resp 18  Ht 6\' 2"  (1.88 m)  Wt 214 lb 1.9 oz (97.124 kg)  BMI 27.49 kg/m2 General: NAD, overweight Neck: No JVD, no thyromegaly or thyroid nodule.  Lungs: Somewhat distant breath sounds bilaterally. Decreased breath sounds consistent with pleural effusion at right base.  CV: Nondisplaced PMI.  Heart regular S1/S2, no S3/S4, 2/6 SEM RUSB.  Trace ankle edema.  No carotid bruit.  Normal pedal pulses.  Abdomen: Soft, nontender, no hepatosplenomegaly, no distention.  Neurologic: Alert and oriented x 3.  Psych: Normal affect. Extremities: No clubbing or cyanosis.

## 2010-07-12 NOTE — Patient Instructions (Addendum)
Increase lasix(furosemide) to 60mg  twice a day. This will be one and one-half 40mg  tablets twice a day.  Decrease Aspirin to 81mg  daily. This should be enteric coated.  Schedule an appointment for an echocardiogram.  Schedule an appointment for lab in 1 week--TSH/CBC/BMP/BNP/Lipid profile/Liver profile  786.09  414.05  428.22  Schedule an appointment to see Dr Shirlee Latch in 3 weeks.

## 2010-07-13 NOTE — Assessment & Plan Note (Signed)
Patient does not appear particularly volume overloaded on exam though symptoms are worse.  As above, trial of increasing Lasix to 40 mg bid.

## 2010-07-13 NOTE — Assessment & Plan Note (Signed)
Patient is s/p CABG.  No chest pain (never had CP before), but increased exertional dyspnea.  Steffanie Dunn showed a partially reversible inferior defect this month, while myoview in 2/11 in the hospital prior to CABG showed a fixed inferior defect.  I am not sure if there has been any true change. Patient understandably is very reluctant to undergo cath given concerns about renal function in the setting of renal transplant.  Also, last time he had a cath for peripheral intervention, he ended up with a pseudoaneurysm.   - Continue ASA (may decrease to 81 mg daily), Plavix, lisinopril, statin.  - Check lipids/LFTs with goal LDL < 70.  - As above, if other workup does not fully explain dyspnea, I would consider catheterization.

## 2010-07-13 NOTE — Assessment & Plan Note (Signed)
Patient has had steadily increasing exertional dyspnea since 1/12.  He has NYHA class III symptoms.  It is not entirely clear what the culprit for his symptoms is.  Diastolic CHF is a consideration. He actually does not appear particularly volume overloaded on exam and while BNP was mildly elevated when checked this month, it was not particularly high.  I am concerned that his bilateral probably loculated pleural effusions could be playing a role. Given his past history of heavy smoking, I would also consider COPD.  Oxygen saturation does go down with ambulation.  Finally, CAD is a consideration.  Prior presentation of symptomatic CAD was with dyspnea.  Recently, he had a [place] with a partially reversible inferior defect, suggesting a component of ischemia.  However,  myoview when in the hospital in 2/11 had a fixed inferior defect (prior to CABG).  - Empiric trial of increasing Lasix to 60 mg bid with BMET/BNP in 1 week.  - Check CBC and TSH - Patient has followup with Dr. Delford Field this week.  Will await his opinion regarding thoracentesis and getting PFTs for evaluation of ? COPD.   - Echo to assess LV function and valves.  - If he continues to have symptoms, will need to consider catheterization.

## 2010-07-13 NOTE — Assessment & Plan Note (Signed)
Patient is not having any definite claudication symptoms. Pedal pulses remain good.  He follows up with Dr. Clifton James for this.

## 2010-07-14 ENCOUNTER — Encounter: Payer: Self-pay | Admitting: Critical Care Medicine

## 2010-07-14 ENCOUNTER — Ambulatory Visit (INDEPENDENT_AMBULATORY_CARE_PROVIDER_SITE_OTHER): Payer: Medicare Other | Admitting: Critical Care Medicine

## 2010-07-14 VITALS — BP 110/58 | HR 86 | Temp 97.6°F | Ht 74.0 in | Wt 214.0 lb

## 2010-07-14 DIAGNOSIS — J90 Pleural effusion, not elsewhere classified: Secondary | ICD-10-CM

## 2010-07-14 DIAGNOSIS — J209 Acute bronchitis, unspecified: Secondary | ICD-10-CM

## 2010-07-14 MED ORDER — MOXIFLOXACIN HCL 400 MG PO TABS: 400.0000 mg | ORAL_TABLET | Freq: Every day | ORAL | Status: AC

## 2010-07-14 MED ORDER — MOMETASONE FUROATE 220 MCG/INH IN AEPB: 2.0000 | INHALATION_SPRAY | Freq: Every day | RESPIRATORY_TRACT | Status: DC

## 2010-07-14 NOTE — Patient Instructions (Signed)
Start Avelox one daily for 5days Start Asmanex two puff daily use sample until gone then stop An ultrasound thoracentesis will be obtained at [facility] of the Right chest Return [place] One week

## 2010-07-14 NOTE — Progress Notes (Signed)
Subjective:    Patient ID: John [name], male    DOB: [birth year], 70 y.o.   MRN: [id number]  Shortness of Breath This is a chronic problem. The current episode started more than 1 month ago (3-4 mo ago, worse dyspnea if reclined.  Would have to take a deeper breath.  Then carried laundry up steps one month ago, up to top of stairs gasping for air.). The problem occurs daily. The problem has been gradually worsening (Getting worse over 3 months, now minimal exertion is an issue.  ). Duration: If goes up stairs would take 3-57min. Associated symptoms include sputum production and syncope. Pertinent negatives include no abdominal pain, chest pain, fever, headaches, hemoptysis, leg pain, leg swelling, neck pain, orthopnea, PND, rash, sore throat, swollen glands, vomiting or wheezing. The symptoms are aggravated by any activity, exercise and URIs. Associated symptoms comments: Pt had URI in past year, every time will have a cold will have mucus to clear up.  Mucus is clear to beige DOes get dizzy, no syncope Sees D[facility]meier for orthostatic hypotension,  ShyDrager. . His past medical history is significant for CAD. There is no history of aspirin allergies, asthma, bronchiolitis, COPD, DVT, a heart failure, PE, pneumonia or a recent surgery.  Cough This is a chronic problem. The current episode started more than 1 month ago. The problem has been unchanged. The problem occurs every few hours. The cough is productive of sputum. Associated symptoms include shortness of breath. Pertinent negatives include no chest pain, fever, headaches, heartburn, hemoptysis, myalgias, nasal congestion, postnasal drip, rash, sore throat or wheezing. There is no history of asthma, COPD or pneumonia.   70 y.o.WM seen in f/u for pleural effusions. Last seen first and only time 11/11 with hx as below:  This is 70 y.o.  male who presents with pleural effusion. The patient has no history of history of diagnosed COPD, shortness of  breath, chest tightness, chest pain worse with breathing and coughing, wheezing, cough, mucous production, nocturnal awakening, exercise induced symptoms, and congestion. The patient comes in today for initial evaluation of pleural effusion. Evaluation to date has included CXR. The chest X-ray reveals a right pleural effusion.  This pt has a loculated R pleural effusion on CXR. This pt had a complicated course after CABG 2/11. Last CXR showed effusion R lung 3/11. No films since.  PT is s/p renal TPL 2002.  There are no new symptoms, no cough, no chest pain. No pulm complaints at all  PT has fatigue in rehab only.   Pt seen today 07/14/10 as a workin for ongoing dyspnea. In 11/11 we elected to watch the effusion on R.  See symptoms as above for dyspnea and cough   Past Medical History  Diagnosis Date  . CAD (coronary artery disease)     a. myoview 1/11: lg inf fixed defect;   b. cath 1/11: 3v CAD,  c. s/p CABG 1/11 c/b acute renal failure and AFib  . Atrial fibrillation   . Chronic diastolic heart failure     a. echo 4/11: EF 45-50%, mod LVH, LAE, inf and septal HK  . First degree AV block   . Orthostatic hypotension     pyridostigmine  . History of renal transplant   . DM2 (diabetes mellitus, type 2)   . Diabetic gastroparesis   . HTN (hypertension)   . HLD (hyperlipidemia)   . PAD (peripheral artery disease)     s/p R SFA-Pop atherectomy 11/11  . Prostate cancer   .  ED (erectile dysfunction)   . DDD (degenerative disc disease), lumbar   . Tremor   . Obesity   . Low testosterone   . Chronic kidney disease     s/p renal transplant 2002  . Pleural effusion, right     loculated     Family History  Problem Relation Age of Onset  . Coronary artery disease Mother   . Valvular heart disease Father   . Hepatitis      sibling  . Coronary artery disease Sister   . Heart attack Sister   . Tuberculosis Father      History   Social History  . Marital Status: Married    Spouse  Name: N/A    Number of Children: 3  . Years of Education: N/A   Occupational History  . retired Clinical research associate    Social History Main Topics  . Smoking status: Former Smoker -- 1.5 packs/day for 20 years    Types: Cigarettes    Quit date: 02/20/1978  . Smokeless tobacco: Never Used   Comment: smoked 1 ppd, stopped 1980  . Alcohol Use: No  . Drug Use: No  . Sexually Active: Not on file   Other Topics Concern  . Not on file   Social History Narrative  . No narrative on file     No Known Allergies   Outpatient Prescriptions Prior to Visit  Medication Sig Dispense Refill  . Acetylcysteine (NAC 600 PO) Take by mouth 2 (two) times daily.        Marland Kitchen aspirin EC 81 MG tablet Take 1 tablet (81 mg total) by mouth daily.  150 tablet  2  . atorvastatin (LIPITOR) 40 MG tablet Take 40 mg by mouth daily.        . Calcium Carb-Cholecalciferol (CALCIUM 1000 + D PO) Take 1,000 mg by mouth daily.        . carbidopa-levodopa (SINEMET CR) 25-100 MG per tablet Take 1 tablet by mouth 3 (three) times daily.        . Cholecalciferol (VITAMIN D3) 1000 UNITS CAPS Take 10,000 mg by mouth. Twice a week.      . clopidogrel (PLAVIX) 75 MG tablet Take 75 mg by mouth daily.        . DULoxetine (CYMBALTA) 60 MG capsule Take 60 mg by mouth daily.        Marland Kitchen ezetimibe (ZETIA) 10 MG tablet Take 10 mg by mouth daily.        . Ferrous Sulfate (IRON) 325 (65 FE) MG TABS Take by mouth daily.        . folic acid (FOLVITE) 800 MCG tablet Take 800 mcg by mouth daily.        . furosemide (LASIX) 40 MG tablet Take 1 and 1/2 tablets twice a day  90 tablet  6  . Insulin Aspart (NOVOLOG [place]) Inject into the skin. Via pump as directed        . LamoTRIgine (LAMICTAL ODT PO) Take by mouth as directed.        Marland Kitchen lisinopril (PRINIVIL,ZESTRIL) 2.5 MG tablet Take 2.5 mg by mouth daily.        . magnesium oxide (MAG-OX) 400 MG tablet Take 400 mg by mouth 2 (two) times daily.       . Multiple Vitamin (MULTIVITAMIN) tablet Take 1 tablet by  mouth daily.        . mycophenolate (CELLCEPT) 500 MG tablet Take by mouth 2 (two) times daily.        Marland Kitchen  Omeprazole 20 MG TBEC Take by mouth daily.        Marland Kitchen oxyCODONE-acetaminophen (PERCOCET) 7.5-500 MG per tablet Take 1 tablet by mouth every 4 (four) hours as needed.        . predniSONE (DELTASONE) 5 MG tablet Take 5 mg by mouth daily.        Marland Kitchen pyridostigmine (MESTINON) 60 MG tablet Take 1 tablet (60 mg total) by mouth 2 (two) times daily.  60 tablet  6  . sitaGLIPtan (JANUVIA) 100 MG tablet Take 100 mg by mouth daily.        . tacrolimus (PROGRAF) 5 MG capsule Take 5 mg by mouth 2 (two) times daily.        . Tamsulosin HCl (FLOMAX) 0.4 MG CAPS Take by mouth daily.        . temazepam (RESTORIL) 15 MG capsule Take 15 mg by mouth at bedtime as needed.        . Testosterone (ANDROGEL PUMP TD) Place onto the skin as directed.        . vitamin B-12 (CYANOCOBALAMIN) 500 MCG tablet Take 500 mcg by mouth daily.        . furosemide (LASIX) 40 MG tablet Take 1 and 1/2 tablets twice a day  90 tablet  6  . pyridostigmine (MESTINON) 60 MG tablet Take 1 tablet (60 mg total) by mouth 2 (two) times daily.  60 tablet  6     Review of Systems  Constitutional: Negative for fever.  HENT: Negative for sore throat, neck pain and postnasal drip.   Respiratory: Positive for cough, sputum production and shortness of breath. Negative for hemoptysis and wheezing.   Cardiovascular: Positive for syncope. Negative for chest pain, orthopnea, leg swelling and PND.  Gastrointestinal: Negative for heartburn, vomiting and abdominal pain.  Musculoskeletal: Negative for myalgias.  Skin: Negative for rash.  Neurological: Negative for headaches.   Constitutional:   No  weight loss, night sweats,  Fevers, chills, fatigue, lassitude. HEENT:   No headaches,  Difficulty swallowing,  Tooth/dental problems,  Sore throat,                No sneezing, itching, ear ache, nasal congestion, post nasal drip,   CV:  No chest pain,   Orthopnea, PND, swelling in lower extremities, anasarca, dizziness, palpitations  GI  No heartburn, indigestion, abdominal pain, nausea, vomiting, diarrhea, change in bowel habits, loss of appetite  Resp: Notes  shortness of breath with exertion not  at rest.  Notes  excess mucus, notes  productive cough,  No non-productive cough,  No coughing up of blood.  No change in color of mucus.  No wheezing.  No chest wall deformity  Skin: no rash or lesions.  GU: no dysuria, change in color of urine, no urgency or frequency.  No flank pain.  MS:  No joint pain or swelling.  No decreased range of motion.  No back pain.  Psych:  No change in mood or affect. No depression or anxiety.  No memory loss.     Objective:   Physical Exam Filed Vitals:   07/14/10 1401  BP: 110/58  Pulse: 86  Temp: 97.6 F (36.4 C)  TempSrc: Oral  Height: 6\' 2"  (1.88 m)  Weight: 214 lb (97.07 kg)  SpO2: 93%    Gen: Pleasant, well-nourished, in no distress,  normal affect  ENT: No lesions,  mouth clear,  oropharynx clear, no postnasal drip  Neck: No JVD, no TMG, no carotid bruits  Lungs: No use of accessory muscles, no dullness to percussion, decreased BS in both LLs R>L  Cardiovascular: RRR, heart sounds normal, no murmur or gallops, no peripheral edema  Abdomen: soft and NT, no HSM,  BS normal  Musculoskeletal: No deformities, no cyanosis or clubbing  Neuro: alert, non focal  Skin: Warm, no lesions or rashes     CXR 07/05/10 Findings: There is a moderate sized loculated pleural effusion  posterolaterally on the right which is unchanged from the most  recent examinations. There has been interval development of a  loculated pleural effusion posterolaterally on the left. There is  increased left lower lobe atelectasis. No edema is identified.  Heart size and mediastinal contours are stable status post CABG.  IMPRESSION:  Right greater than left loculated pleural effusions, new on the  left compared  with prior studies. If the etiology for these is  unknown, CT and thoracentesis should be considered for further  assessment.     Assessment & Plan:   PLEURAL EFFUSION, RIGHT Loculated pleural effusions with prior CABG and post pump pleuritis,  Now with prob LV ischemia, LV dysfunction leading to fluid retention in abnormal pleural spaces.  Myoview shows ischemia to inferior wall. Plan Before recath and stent for inferior LV wall, lets try to tap the R effusion first. Also pt with tracheobronchitis as a result of lung atelectasis Rx avelox x 5days 400mg /d Asmanex two puff daily Tap R effusion under u/s guidance at [facility]     Updated Medication List Outpatient Encounter Prescriptions as of 07/14/2010  Medication Sig Dispense Refill  . Acetylcysteine (NAC 600 PO) Take by mouth 2 (two) times daily.        Marland Kitchen aspirin EC 81 MG tablet Take 1 tablet (81 mg total) by mouth daily.  150 tablet  2  . atorvastatin (LIPITOR) 40 MG tablet Take 40 mg by mouth daily.        . Calcium Carb-Cholecalciferol (CALCIUM 1000 + D PO) Take 1,000 mg by mouth daily.        . carbidopa-levodopa (SINEMET CR) 25-100 MG per tablet Take 1 tablet by mouth 3 (three) times daily.        . Cholecalciferol (VITAMIN D3) 1000 UNITS CAPS Take 10,000 mg by mouth. Twice a week.      . clopidogrel (PLAVIX) 75 MG tablet Take 75 mg by mouth daily.        . DULoxetine (CYMBALTA) 60 MG capsule Take 60 mg by mouth daily.        Marland Kitchen ezetimibe (ZETIA) 10 MG tablet Take 10 mg by mouth daily.        . Ferrous Sulfate (IRON) 325 (65 FE) MG TABS Take by mouth daily.        . folic acid (FOLVITE) 800 MCG tablet Take 800 mcg by mouth daily.        . furosemide (LASIX) 40 MG tablet Take 1 and 1/2 tablets twice a day  90 tablet  6  . Insulin Aspart (NOVOLOG [place]) Inject into the skin. Via pump as directed        . LamoTRIgine (LAMICTAL ODT PO) Take by mouth as directed.        Marland Kitchen lisinopril (PRINIVIL,ZESTRIL) 2.5 MG tablet Take 2.5 mg by mouth  daily.        . magnesium oxide (MAG-OX) 400 MG tablet Take 400 mg by mouth 2 (two) times daily.       . Multiple Vitamin (MULTIVITAMIN) tablet Take 1 tablet  by mouth daily.        . mycophenolate (CELLCEPT) 500 MG tablet Take by mouth 2 (two) times daily.        . Omeprazole 20 MG TBEC Take by mouth daily.        Marland Kitchen oxyCODONE-acetaminophen (PERCOCET) 7.5-500 MG per tablet Take 1 tablet by mouth every 4 (four) hours as needed.        . predniSONE (DELTASONE) 5 MG tablet Take 5 mg by mouth daily.        Marland Kitchen pyridostigmine (MESTINON) 60 MG tablet Take 1 tablet (60 mg total) by mouth 2 (two) times daily.  60 tablet  6  . sitaGLIPtan (JANUVIA) 100 MG tablet Take 100 mg by mouth daily.        . tacrolimus (PROGRAF) 5 MG capsule Take 5 mg by mouth 2 (two) times daily.        . Tamsulosin HCl (FLOMAX) 0.4 MG CAPS Take by mouth daily.        . temazepam (RESTORIL) 15 MG capsule Take 15 mg by mouth at bedtime as needed.        . Testosterone (ANDROGEL PUMP TD) Place onto the skin as directed.        . vitamin B-12 (CYANOCOBALAMIN) 500 MCG tablet Take 500 mcg by mouth daily.        . mometasone (ASMANEX 120 METERED DOSES) 220 MCG/INH inhaler Inhale 2 puffs into the lungs daily.  1 Inhaler  0  . moxifloxacin (AVELOX) 400 MG tablet Take 1 tablet (400 mg total) by mouth daily.  5 tablet  0  . DISCONTD: furosemide (LASIX) 40 MG tablet Take 1 and 1/2 tablets twice a day  90 tablet  6  . DISCONTD: pyridostigmine (MESTINON) 60 MG tablet Take 1 tablet (60 mg total) by mouth 2 (two) times daily.  60 tablet  6

## 2010-07-14 NOTE — Assessment & Plan Note (Signed)
Loculated pleural effusions with prior CABG and post pump pleuritis,  Now with prob LV ischemia, LV dysfunction leading to fluid retention in abnormal pleural spaces.  Myoview shows ischemia to inferior wall. Plan Before recath and stent for inferior LV wall, lets try to tap the R effusion first. Also pt with tracheobronchitis as a result of lung atelectasis Rx avelox x 5days 400mg /d Asmanex two puff daily Tap R effusion under u/s guidance at [facility]

## 2010-07-19 ENCOUNTER — Other Ambulatory Visit (HOSPITAL_COMMUNITY): Payer: Medicare Other | Admitting: Radiology

## 2010-07-19 ENCOUNTER — Other Ambulatory Visit (INDEPENDENT_AMBULATORY_CARE_PROVIDER_SITE_OTHER): Payer: Medicare Other | Admitting: *Deleted

## 2010-07-19 ENCOUNTER — Other Ambulatory Visit: Payer: Self-pay | Admitting: Dermatology

## 2010-07-19 ENCOUNTER — Other Ambulatory Visit: Payer: Self-pay | Admitting: Diagnostic Radiology

## 2010-07-19 ENCOUNTER — Ambulatory Visit (HOSPITAL_COMMUNITY)
Admission: RE | Admit: 2010-07-19 | Discharge: 2010-07-19 | Disposition: A | Discharge status: Home / Self Care | Payer: Medicare Other | Source: Ambulatory Visit | Attending: Critical Care Medicine | Admitting: Critical Care Medicine

## 2010-07-19 ENCOUNTER — Other Ambulatory Visit: Payer: Self-pay | Admitting: Critical Care Medicine

## 2010-07-19 DIAGNOSIS — R5383 Other fatigue: Secondary | ICD-10-CM

## 2010-07-19 DIAGNOSIS — R0609 Other forms of dyspnea: Secondary | ICD-10-CM

## 2010-07-19 DIAGNOSIS — I5022 Chronic systolic (congestive) heart failure: Secondary | ICD-10-CM

## 2010-07-19 DIAGNOSIS — R5381 Other malaise: Secondary | ICD-10-CM

## 2010-07-19 DIAGNOSIS — J90 Pleural effusion, not elsewhere classified: Secondary | ICD-10-CM | POA: Insufficient documentation

## 2010-07-19 DIAGNOSIS — R0989 Other specified symptoms and signs involving the circulatory and respiratory systems: Secondary | ICD-10-CM

## 2010-07-19 DIAGNOSIS — I25810 Atherosclerosis of coronary artery bypass graft(s) without angina pectoris: Secondary | ICD-10-CM

## 2010-07-19 LAB — BASIC METABOLIC PANEL
CO2: 26 mEq/L (ref 19–32)
Calcium: 8.8 mg/dL (ref 8.4–10.5)
Chloride: 105 mEq/L (ref 96–112)
Creatinine, Ser: 1.1 mg/dL (ref 0.4–1.5)
Glucose, Bld: 54 mg/dL — ABNORMAL LOW (ref 70–99)
Sodium: 140 mEq/L (ref 135–145)

## 2010-07-19 LAB — CBC WITH DIFFERENTIAL/PLATELET
Eosinophils Relative: 2 % (ref 0.0–5.0)
HCT: 34.8 % — ABNORMAL LOW (ref 39.0–52.0)
Hemoglobin: 11.4 g/dL — ABNORMAL LOW (ref 13.0–17.0)
Lymphocytes Relative: 29.4 % (ref 12.0–46.0)
Lymphs Abs: 2.9 10*3/uL (ref 0.7–4.0)
Monocytes Relative: 8.9 % (ref 3.0–12.0)
Platelets: 217 10*3/uL (ref 150.0–400.0)
WBC: 9.7 10*3/uL (ref 4.5–10.5)

## 2010-07-19 LAB — BODY FLUID CELL COUNT WITH DIFFERENTIAL
Eos, Fluid: 0 %
Neutrophil Count, Fluid: 14 % (ref 0–25)
Total Nucleated Cell Count, Fluid: 263 cu mm (ref 0–1000)

## 2010-07-19 LAB — LIPID PANEL
LDL Cholesterol: 42 mg/dL (ref 0–99)
Total CHOL/HDL Ratio: 2
Triglycerides: 92 mg/dL (ref 0.0–149.0)

## 2010-07-19 LAB — LACTATE DEHYDROGENASE, PLEURAL OR PERITONEAL FLUID: LD, Fluid: 266 U/L — ABNORMAL HIGH (ref 3–23)

## 2010-07-19 LAB — HEPATIC FUNCTION PANEL
AST: 16 U/L (ref 0–37)
Albumin: 2.8 g/dL — ABNORMAL LOW (ref 3.5–5.2)
Alkaline Phosphatase: 85 U/L (ref 39–117)
Total Bilirubin: 0.4 mg/dL (ref 0.3–1.2)

## 2010-07-19 LAB — TSH: TSH: 0.47 u[IU]/mL (ref 0.35–5.50)

## 2010-07-20 LAB — PH, BODY FLUID: pH, Fluid: 8

## 2010-07-21 ENCOUNTER — Ambulatory Visit: Payer: Medicare Other | Admitting: Critical Care Medicine

## 2010-07-23 LAB — BODY FLUID CULTURE
Culture: NO GROWTH
Gram Stain: NONE SEEN

## 2010-07-25 ENCOUNTER — Telehealth: Payer: Self-pay | Admitting: Cardiovascular Disease

## 2010-07-25 NOTE — Telephone Encounter (Signed)
Pt rtn call to [facility] to get lab results

## 2010-07-25 NOTE — Telephone Encounter (Signed)
Pt aware of all lab results. Mylo Red RN

## 2010-07-26 ENCOUNTER — Ambulatory Visit (INDEPENDENT_AMBULATORY_CARE_PROVIDER_SITE_OTHER): Payer: Medicare Other | Admitting: Adult Health

## 2010-07-26 ENCOUNTER — Encounter: Payer: Self-pay | Admitting: Adult Health

## 2010-07-26 ENCOUNTER — Ambulatory Visit: Payer: Medicare Other | Admitting: Adult Health

## 2010-07-26 VITALS — BP 140/72 | HR 85 | Temp 98.1°F | Ht 74.0 in | Wt 215.0 lb

## 2010-07-26 DIAGNOSIS — R0602 Shortness of breath: Secondary | ICD-10-CM

## 2010-07-26 MED ORDER — TIOTROPIUM BROMIDE MONOHYDRATE 18 MCG IN CAPS: 18.0000 ug | ORAL_CAPSULE | Freq: Every day | RESPIRATORY_TRACT | Status: DC

## 2010-07-26 NOTE — Assessment & Plan Note (Signed)
Suspect this is multifactoral in nature with underlying diastolic dysfunction. Fluid volume appears improved with  Increased diuretics. However he is very complicated with hx of orhtostatic hypotension (on ACE inhibitor and diurectics) He complains severe fatigue which could be side effect of polypharmacy -he is on chronic pain med, b/p med, diuretics, parkinson meds which could all be contributing.  His spirometry was poor quality due to inability to give a good exhalation however it does show decreased lung fnx. Maybe once he is  A little stronger will set up for a full PFT - FEV1 today shows 1.35l/m (35%). He has a hx of heavy smoking-will begin Spiriva today -2 sample given.  Set up for an overnight oximetry test. He does desat minimally at 89% which does not qualify him for O2.  Will await ONO results. Also may want to consider sleep test in future - he does have some risk factors for this.  Have him come back in 2 weeks for close follow up .

## 2010-07-26 NOTE — Progress Notes (Signed)
Subjective:    Patient ID: John [name], male    DOB: September 24, 1940, 70 y.o.   MRN: [id number]  HPI 70 yo with history of renal transplant on chronic immunosuppressants, DM-insulin dependent, HTN, , Chronic back pain and CAD s/p CABG . Patient had a long and complicated hospital admission in 1/11 s/p CABG x 5.   Post-op course was complicated by pulmonary edema requiring intubation, renal failure requiring CVVH, and atrial fibrillation. He was in the hospital about 7 weeks. In 11/11, patient had right SFA and popliteal atherectomy that was complicated by a groin pseudoaneurysm. Hx of orthostatic hypotension, ShyDrager, and severe tremor followed by [facility]    07/14/10 -  presents with pleural effusion. The patient has no history of history of diagnosed COPD, shortness of breath, chest tightness, chest pain worse with breathing and coughing, wheezing, cough, mucous production, nocturnal awakening, exercise induced symptoms, and congestion. The patient comes in today for initial evaluation of pleural effusion. Evaluation to date has included CXR. The chest X-ray reveals a right pleural effusion.  This pt has a loculated R pleural effusion on CXR. This pt had a complicated course after CABG 2/11. Last CXR showed effusion R lung 3/11. No films since. PT is s/p renal TPL 2002. There are no new symptoms, no cough, no chest pain. No pulm complaints at all PT has fatigue in rehab only.  Pt seen today 07/14/10 as a workin for ongoing dyspnea. In 11/11 we elected to watch the effusion on R.  See symptoms as above for dyspnea and cough  >>right thoracentesis and Avelox.   07/26/10 Follow up  Pt returns for 2 week follow up. Seen last ov with persistent DOE, dry cough and weakness. Set up for a right thoracentesis w/ 0.5 liters pulled off.  Path was neg for malignant cells. NO growth on cx.  Post thoracetesis film with decreased effusion size. Was given Avelox for 5 days . Pt says he could take deeper breath but  has seen no real change in his dyspnea level.   Main complaint is he is so fatigued that he wears out with minimal activity that is associated with dyspnea. He gets fatigued and winded just getting ready for bed. Most trouble is with inclines and if he has to exert himself.   Labs last ov were essentially unremarbable with improved BNP ~150. Hbg at 11.4. TSH nml.   He is on numerous meds incluiding ACE inhbitor. Says his dry cough is minimal and does not consider bothersome. He denies wheeze.  He was given Asmanex last ov but did not feel it helped.   Today we did in office spirometry. Pt had great difficulty completing this test. Poor quality -FEV1 1.35  l/m (35%) He smoked on/off for >20 years >1PPD beginning at age 27.   He does snore some and feels tired in am. Significant fatigue ? Daytime hypersolomenence.    Past Medical History:  1. Diabetes mellitus  2. CKD s/p renal transplant 2002  3. HTN  4. PAD: Recent atherectomy right SFA and right popliteal on 01/12/10. Complicated by pseudoaneurysm.  5. Lumbar disc disease with low back pain  6. Diabetic gastroparesis  7. Prostate cancer s/p seed implantation in 1/06.  8. Erectile dysfunction  9. Tremor  10. Obesity  11. Diastolic CHF: Last echo (4/11) with inferior and septal hypokinesis, moderate LV hypertrophy, EF 45-50%, mild left atrial enlargement.  12. CAD: Lexiscan myoview (2/11) with EF 44%, fixed inferior defect. Cath (2/11) with 60%  distal left main, 90% ostial LAD involving D1, 95% ostial large ramus, probable significant ostial CFX stenosis, 90% PDA, 70% PLV. Paitent had CABG with LIMA-LAD, SVG-D, sequential SVG-ramus and OM2, SVG-PLV. Lexsican myoview (5/12) with EF 59%, partially reversible inferior defect.  13. Atrial fibrillation post-op CABG  14. Orthostatic hypotension: Some improvement with pyridostigmine.  15. Low testosterone  16. Loculated right>left pleural effusion    Review of Systems Constitutional:   No   weight loss, night sweats,  Fevers, chills,  fatigue, or  lassitude.  HEENT:   No headaches,  Difficulty swallowing,  Tooth/dental problems, or  Sore throat,                No sneezing, itching, ear ache, nasal congestion, post nasal drip,   CV:  No chest pain,   +Orthopnea, PND, swelling in lower extremities,  , dizziness,     GI  No heartburn, indigestion, abdominal pain, nausea, vomiting, diarrhea, change in bowel habits, loss of appetite, bloody stools.   Resp: +shortness of breath with exertion or at rest.  No excess mucus, no productive cough,  + non-productive cough,  No coughing up of blood.  No change in color of mucus.  No wheezing.  No chest wall deformity  Skin: no rash or lesions.  GU: no dysuria, change in color of urine, no urgency or frequency.  No flank pain, no hematuria   MS:  +joint pain   +chronic  back pain.  Psych:  No change in mood or affect. No depression or anxiety.  No memory loss.          Objective:   Physical Exam GEN: A/Ox3; pleasant , NAD, chronically ill appearing.   HEENT:  [place]/AT,  EACs-clear, TMs-wnl, NOSE-clear, THROAT-clear, no lesions, no postnasal drip or exudate noted.   NECK:  Supple w/ fair ROM; no JVD; normal carotid impulses w/o bruits; no thyromegaly or nodules palpated; no lymphadenopathy.  RESP  Coarse BS w/ no wheezing diminshed BS in bases no accessory muscle use, no dullness to percussion  CARD:  RRR, no m/r/g  , Tr -1+ peripheral edema, pulses intact, no cyanosis or clubbing.  GI:   Soft & nt; nml bowel sounds; no organomegaly or masses detected. Insulin pump  Musco: Warm bil, no deformities or joint swelling noted.   Neuro: alert, no focal deficits noted.   Hand tremor w/ "pill rolling" , shuffling gait.   Skin: Warm, multiple ecccymotic spots along arms.          Assessment & Plan:

## 2010-07-26 NOTE — Patient Instructions (Signed)
Begin Spiriva daily  We are setting you up for a overnight oximetry test.  follow up Dr. Delford Field  In 2 weeks  Please contact office for sooner follow up if symptoms do not improve or worsen or seek emergency care

## 2010-07-27 ENCOUNTER — Ambulatory Visit (HOSPITAL_COMMUNITY): Payer: Medicare Other | Attending: Cardiology | Admitting: Radiology

## 2010-07-27 DIAGNOSIS — I2510 Atherosclerotic heart disease of native coronary artery without angina pectoris: Secondary | ICD-10-CM | POA: Insufficient documentation

## 2010-07-27 DIAGNOSIS — I509 Heart failure, unspecified: Secondary | ICD-10-CM | POA: Insufficient documentation

## 2010-07-27 DIAGNOSIS — R0989 Other specified symptoms and signs involving the circulatory and respiratory systems: Secondary | ICD-10-CM | POA: Insufficient documentation

## 2010-07-27 DIAGNOSIS — I5022 Chronic systolic (congestive) heart failure: Secondary | ICD-10-CM

## 2010-07-27 DIAGNOSIS — R0609 Other forms of dyspnea: Secondary | ICD-10-CM | POA: Insufficient documentation

## 2010-07-27 DIAGNOSIS — I079 Rheumatic tricuspid valve disease, unspecified: Secondary | ICD-10-CM | POA: Insufficient documentation

## 2010-07-28 ENCOUNTER — Encounter: Payer: Self-pay | Admitting: Cardiovascular Disease

## 2010-07-28 ENCOUNTER — Ambulatory Visit (INDEPENDENT_AMBULATORY_CARE_PROVIDER_SITE_OTHER): Payer: Medicare Other | Admitting: Cardiovascular Disease

## 2010-07-28 VITALS — BP 127/70 | HR 92 | Resp 16 | Ht 74.0 in | Wt 213.0 lb

## 2010-07-28 DIAGNOSIS — I7389 Other specified peripheral vascular diseases: Secondary | ICD-10-CM

## 2010-07-28 NOTE — Progress Notes (Signed)
History of Present Illness:69 yo male with history of PAD, CAD s/p CABG, renal transplant, HTN, DM and post-op atrial fibrillation who is here today for PV follow up. His cardiac issues are followed by Dr. Shirlee Latch. He has had had problems with bilateral  lower extremity pain for over ten years and has been seen in the past by Dr. Edilia Bo with [facility]. I saw him in October 2012  and planned a lower ext angiogram. He was found to have severe disease in the distal right SFA and right popliteal. I staged his intervention and brought him back in for atherectomy of the right SFA and popliteal on 01/12/10. The intervention went well but he unfortunately had a pseudoaneurysm form at the left groin cath site. Dr. Myra Gianotti helped me inject the pseudoaneurysm with thrombin on 01/14/10. He was discharged home on 01/15/10.   He is here today for follow up. His right leg feels great. No claudication. His left leg is feeling better at the cath site. U/S of the left groin in March 2012  with closed pseudoaneurysm. His only complaint today is SOB. He has had a recent thoracentesis which helped his deep breathing some but he still has constant fatigue and SOB. Echo yesterday with normal LV function. No chest pain.   Past Medical History  Diagnosis Date  . CAD (coronary artery disease)     a. myoview 1/11: lg inf fixed defect;   b. cath 1/11: 3v CAD,  c. s/p CABG 1/11 c/b acute renal failure and AFib  . Atrial fibrillation   . Chronic diastolic heart failure     a. echo 4/11: EF 45-50%, mod LVH, LAE, inf and septal HK  . First degree AV block   . Orthostatic hypotension     pyridostigmine  . History of renal transplant   . DM2 (diabetes mellitus, type 2)   . Diabetic gastroparesis   . HTN (hypertension)   . HLD (hyperlipidemia)   . PAD (peripheral artery disease)     s/p R SFA-Pop atherectomy 11/11  . Prostate cancer   . ED (erectile dysfunction)   . DDD (degenerative disc disease), lumbar   . Tremor   . Obesity     . Low testosterone   . Chronic kidney disease     s/p renal transplant 2002  . Pleural effusion, right     loculated    Past Surgical History  Procedure Date  . Coronary artery bypass graft 03/2009  . Cataract extraction   . Vitrectomy   . Tonsillectomy   . Kidney transplant 2002  . Hernia repair   . Prostate cancer rx 2006 brachytherapy   . Prior peritoneal catheter placement     Current Outpatient Prescriptions  Medication Sig Dispense Refill  . Acetylcysteine (NAC 600 PO) Take by mouth 2 (two) times daily.        Marland Kitchen aspirin EC 81 MG tablet Take 1 tablet (81 mg total) by mouth daily.  150 tablet  2  . atorvastatin (LIPITOR) 40 MG tablet Take 40 mg by mouth daily.        . Calcium Carb-Cholecalciferol (CALCIUM 1000 + D PO) Take 1,000 mg by mouth daily.        . carbidopa-levodopa (SINEMET CR) 25-100 MG per tablet Take 1 tablet by mouth 3 (three) times daily.        . Cholecalciferol (VITAMIN D3) 1000 UNITS CAPS Take 10,000 mg by mouth. Twice a week.      . clopidogrel (PLAVIX) 75  MG tablet Take 75 mg by mouth daily.        . DULoxetine (CYMBALTA) 60 MG capsule Take 60 mg by mouth daily.        Marland Kitchen ezetimibe (ZETIA) 10 MG tablet Take 10 mg by mouth daily.        . Ferrous Sulfate (IRON) 325 (65 FE) MG TABS Take by mouth daily.        . folic acid (FOLVITE) 800 MCG tablet Take 800 mcg by mouth daily.        . furosemide (LASIX) 40 MG tablet Take 1 and 1/2 tablets twice a day  90 tablet  6  . Insulin Aspart (NOVOLOG [place]) Inject into the skin. Via pump as directed        . LamoTRIgine (LAMICTAL ODT PO) Take by mouth as directed.        Marland Kitchen lisinopril (PRINIVIL,ZESTRIL) 2.5 MG tablet Take 2.5 mg by mouth daily.        . magnesium oxide (MAG-OX) 400 MG tablet Take 400 mg by mouth 2 (two) times daily.       . mometasone (ASMANEX 120 METERED DOSES) 220 MCG/INH inhaler Inhale 2 puffs into the lungs daily.  1 Inhaler  0  . Multiple Vitamin (MULTIVITAMIN) tablet Take 1 tablet by mouth daily.         . mycophenolate (CELLCEPT) 500 MG tablet Take by mouth 2 (two) times daily.        . Omeprazole 20 MG TBEC Take by mouth daily.        Marland Kitchen oxyCODONE (OXY IR/ROXICODONE) 5 MG immediate release tablet Take 1 tablet by mouth as needed.      Marland Kitchen oxyCODONE-acetaminophen (PERCOCET) 7.5-500 MG per tablet Take 1 tablet by mouth every 4 (four) hours as needed.        . predniSONE (DELTASONE) 5 MG tablet Take 5 mg by mouth daily.        Marland Kitchen pyridostigmine (MESTINON) 60 MG tablet Take 1 tablet (60 mg total) by mouth 2 (two) times daily.  60 tablet  6  . sitaGLIPtan (JANUVIA) 100 MG tablet Take 100 mg by mouth daily.        . tacrolimus (PROGRAF) 5 MG capsule Take 5 mg by mouth 2 (two) times daily.        . Tamsulosin HCl (FLOMAX) 0.4 MG CAPS Take by mouth daily.        . temazepam (RESTORIL) 15 MG capsule Take 15 mg by mouth at bedtime as needed.        . Testosterone (ANDROGEL PUMP TD) Place onto the skin as directed.        . tiotropium (SPIRIVA HANDIHALER) 18 MCG inhalation capsule Place 1 capsule (18 mcg total) into inhaler and inhale daily.  30 capsule  2  . vitamin B-12 (CYANOCOBALAMIN) 500 MCG tablet Take 500 mcg by mouth daily.          No Known Allergies  History   Social History  . Marital Status: Married    Spouse Name: N/A    Number of Children: 3  . Years of Education: N/A   Occupational History  . retired Clinical research associate    Social History Main Topics  . Smoking status: Former Smoker -- 1.5 packs/day for 20 years    Types: Cigarettes    Quit date: 02/20/1978  . Smokeless tobacco: Never Used   Comment: smoked 1 ppd, stopped 1980  . Alcohol Use: No  . Drug Use: No  .  Sexually Active: Not on file   Other Topics Concern  . Not on file   Social History Narrative  . No narrative on file    Family History  Problem Relation Age of Onset  . Coronary artery disease Mother   . Valvular heart disease Father   . Hepatitis      sibling  . Coronary artery disease Sister   . Heart  attack Sister   . Tuberculosis Father     Review of Systems:  As stated in the HPI and otherwise negative.   BP 127/70  Pulse 92  Resp 16  Ht 6\' 2"  (1.88 m)  Wt 213 lb (96.616 kg)  BMI 27.35 kg/m2  Physical Examination: General: Well developed, well nourished, NAD HEENT: OP clear, mucus membranes moist SKIN: warm, dry. No rashes. Neuro: No focal deficits Musculoskeletal: Muscle strength 5/5 all ext Psychiatric: Mood and affect normal Neck: No JVD, no carotid bruits, no thyromegaly, no lymphadenopathy. Lungs:Clear bilaterally, no wheezes, rhonci, crackles Cardiovascular: Regular rate and rhythm. No murmurs, gallops or rubs. Abdomen:Soft. Bowel sounds present. Non-tender.  Extremities: No lower extremity edema. Pulses are 1 + in the bilateral DP/PT.

## 2010-07-28 NOTE — Patient Instructions (Addendum)
Your physician recommends that you schedule a follow-up appointment in: 1 YEAR  Your physician has requested that you have an ankle brachial index (ABI). During this test an ultrasound and blood pressure cuff are used to evaluate the arteries that supply the arms and legs with blood. Allow thirty minutes for this exam. There are no restrictions or special instructions. IN 3-4 MONTHS.

## 2010-07-28 NOTE — Assessment & Plan Note (Signed)
Stable. Will get ABI in 3-4 months. Continue daily walking. We spent much of the visit discussing his dyspnea. This has been worked up by the pulmonary team. I also reviewed his echo today. He was found to have normal LV function with moderate LVH and likely some diastolic dysfunction.

## 2010-08-07 ENCOUNTER — Other Ambulatory Visit: Payer: Self-pay | Admitting: Cardiology

## 2010-08-11 ENCOUNTER — Ambulatory Visit (INDEPENDENT_AMBULATORY_CARE_PROVIDER_SITE_OTHER): Payer: Medicare Other | Admitting: Cardiology

## 2010-08-11 ENCOUNTER — Encounter: Payer: Self-pay | Admitting: Cardiology

## 2010-08-11 DIAGNOSIS — I6529 Occlusion and stenosis of unspecified carotid artery: Secondary | ICD-10-CM

## 2010-08-11 DIAGNOSIS — I951 Orthostatic hypotension: Secondary | ICD-10-CM

## 2010-08-11 DIAGNOSIS — I5022 Chronic systolic (congestive) heart failure: Secondary | ICD-10-CM

## 2010-08-11 DIAGNOSIS — R0602 Shortness of breath: Secondary | ICD-10-CM

## 2010-08-11 DIAGNOSIS — R5383 Other fatigue: Secondary | ICD-10-CM

## 2010-08-11 DIAGNOSIS — I25810 Atherosclerosis of coronary artery bypass graft(s) without angina pectoris: Secondary | ICD-10-CM

## 2010-08-11 DIAGNOSIS — I7389 Other specified peripheral vascular diseases: Secondary | ICD-10-CM

## 2010-08-11 DIAGNOSIS — R0989 Other specified symptoms and signs involving the circulatory and respiratory systems: Secondary | ICD-10-CM

## 2010-08-11 DIAGNOSIS — R5381 Other malaise: Secondary | ICD-10-CM

## 2010-08-11 LAB — CBC WITH DIFFERENTIAL/PLATELET
Basophils Absolute: 0 10*3/uL (ref 0.0–0.1)
Eosinophils Relative: 1 % (ref 0.0–5.0)
HCT: 32.7 % — ABNORMAL LOW (ref 39.0–52.0)
Lymphs Abs: 1.8 10*3/uL (ref 0.7–4.0)
Monocytes Relative: 5.8 % (ref 3.0–12.0)
Neutrophils Relative %: 78.7 % — ABNORMAL HIGH (ref 43.0–77.0)
Platelets: 287 10*3/uL (ref 150.0–400.0)
RDW: 18.4 % — ABNORMAL HIGH (ref 11.5–14.6)
WBC: 12.4 10*3/uL — ABNORMAL HIGH (ref 4.5–10.5)

## 2010-08-11 LAB — BASIC METABOLIC PANEL
BUN: 23 mg/dL (ref 6–23)
Calcium: 9.1 mg/dL (ref 8.4–10.5)
Creatinine, Ser: 1.1 mg/dL (ref 0.4–1.5)
GFR: 73.29 mL/min (ref >60.00)
Glucose, Bld: 186 mg/dL — ABNORMAL HIGH (ref 70–99)

## 2010-08-11 LAB — TSH: TSH: 0.62 u[IU]/mL (ref 0.35–5.50)

## 2010-08-11 MED ORDER — PYRIDOSTIGMINE BROMIDE 60 MG PO TABS: ORAL_TABLET | ORAL | Status: DC

## 2010-08-11 NOTE — Assessment & Plan Note (Signed)
Patient is s/p CABG.  No chest pain (never had CP before), but increased exertional dyspnea.  John [name] showed a partially reversible inferior defect this month, while myoview in 2/11 in the hospital prior to CABG showed a fixed inferior defect.  I am not sure if there has been any true change. Patient understandably is very reluctant to undergo cath given concerns about renal function in the setting of renal transplant.  Also, last time he had a cath for peripheral intervention, he ended up with a pseudoaneurysm.   - Continue ASA, Plavix, lisinopril, statin.  - LDL < 70 when recently checked (at goal).  - As above, if other workup does not fully explain dyspnea, I would consider catheterization.

## 2010-08-11 NOTE — Progress Notes (Signed)
PCP: Dr. [name]  70 yo with history of renal transplant, DM, HTN, and CAD s/p CABG returns for followup.  Patient had a long and complicated hospital admission in 1/11.  Patient was admitted to [facility] at that time with a UTI and pulmonary edema.  Troponin was noted to be elevated and the patient had a nuclear scan with a large fixed inferior defect.  He ended up having a cardiac catheterization showing severe disease involving the LM, LAD, ramus, CFX and RCA.  He had CABG x 5.  Post-op course was complicated by pulmonary edema requiring intubation, renal failure requiring CVVH, and atrial fibrillation.  He was in the hospital about 7 weeks.  In 11/11, patient had right SFA and popliteal atherectomy that was complicated by a groin pseudoaneurysm.   For the last several months, patient has been more short of breath.  Lexiscan myoview in 5/12 showed a partially reversible inferior defect.  Patient gets winded with moderate activity such as walking up steps or walking fast. He does ok with mild activity like riding his exercise bike or walking slowly.  Echo this month showed preserved EF of 60%, moderate LVH, normal RV, and very mild AS.  When I last saw him, I increased Lasix to 60 mg po bid.  Weight is down about 4 lbs, and BNP decreased from 157=>105.  He saw Dr. Delford Field and had right-sided thoracentesis with removal of 500 cc of fluid.  Cytology was negative for malignancy.  In-office FEV1 was quite low (35% predicted), so he was started on Spiriva. He was unable to tolerate Spiriva due to nausea.    Since last appointment, shortness of breath has not improved much.  He went to the beach last week.  One day he was able to walk a mile on the beach without dyspnea.  However, on other days, he was short of breath walking on the beach.  No chest pain.  He has laid off riding his stationary bike since he had the stress test.  Also, he feels like his orthostatic hypotension is returning.  It is not as severe as  before but he is noting increased lightheadedness with standing.  Seated blood pressure today was 136/74.  He has significant chronic fatigue that seems to have worsened, and he is sleepy during the day.   Labs (9/11): LDL 71, HDL 45 Labs (12/11): K 4.9, creatinine 0.9, HCT 37.1  Labs (5/12): K 4.9, creatinine 0.9, BNP 157, HCT 36.4 Labs (5/12): LDL 42, HDL 44, K 3.7, creatinine 1.1, BNP 105  Allergies (verified):  No Known Drug Allergies  Past Medical History: 1. Diabetes mellitus 2. CKD s/p renal transplant 2002 3. HTN 4. PAD: Recent atherectomy right SFA and right popliteal on 01/12/10.  Complicated by pseudoaneurysm.  5. Lumbar disc disease with low back pain 6. Diabetic gastroparesis 7. Prostate cancer s/p seed implantation in 1/06.  8. Erectile dysfunction 9. Tremor 10.  Obesity 11.  Diastolic CHF: Last echo (6/12) showed EF 60%, moderate LV hypertrophy, very mild AS, normal RV size and systolic function. 12.  CAD: Lexiscan myoview (2/11) with EF 44%,  fixed inferior defect.  Cath (2/11) with 60% distal left main, 90% ostial LAD involving D1, 95% ostial large ramus, probable significant ostial CFX stenosis, 90% PDA, 70% PLV.  Paitent had CABG with LIMA-LAD, SVG-D, sequential SVG-ramus and OM2, SVG-PLV.  Lexiscan myoview (5/12) with EF 59%, partially reversible inferior defect.  13.  Atrial fibrillation post-op CABG 14.  Orthostatic hypotension: Some  improvement with pyridostigmine.  15.  Low testosterone 16.  Loculated right>left pleural effusion: Right thoracentesis (6/12), cytology negative for malignancy.  17.  COPD: Suspected.  FEV1 35% (in-office test).    Family History: Noncontributory  Social History: Married, retired Clinical research associate, lives in [place].  Prior smoker.  Review of Systems        All systems reviewed and negative except as per HPI.   Current Outpatient Prescriptions  Medication Sig Dispense Refill  . Acetylcysteine (NAC 600 PO) Take by mouth 2 (two) times  daily.        Marland Kitchen aspirin EC 81 MG tablet Take 1 tablet (81 mg total) by mouth daily.  150 tablet  2  . atorvastatin (LIPITOR) 40 MG tablet Take 40 mg by mouth daily.        . Calcium Carb-Cholecalciferol (CALCIUM 1000 + D PO) Take 1,000 mg by mouth daily.        . carbidopa-levodopa (SINEMET CR) 25-100 MG per tablet Take 1 tablet by mouth 3 (three) times daily.        . Cholecalciferol (VITAMIN D3) 1000 UNITS CAPS Take 10,000 mg by mouth. Twice a week.      . DULoxetine (CYMBALTA) 60 MG capsule Take 60 mg by mouth daily.        Marland Kitchen ezetimibe (ZETIA) 10 MG tablet Take 10 mg by mouth daily.        . Ferrous Sulfate (IRON) 325 (65 FE) MG TABS Take by mouth daily.        . folic acid (FOLVITE) 800 MCG tablet Take 800 mcg by mouth daily.        . furosemide (LASIX) 40 MG tablet Take 1 and 1/2 tablets twice a day  90 tablet  6  . Insulin Aspart (NOVOLOG ) Inject into the skin. Via pump as directed        . LamoTRIgine (LAMICTAL ODT PO) Take by mouth as directed.        Marland Kitchen lisinopril (PRINIVIL,ZESTRIL) 2.5 MG tablet Take 2.5 mg by mouth daily.        . magnesium oxide (MAG-OX) 400 MG tablet Take 400 mg by mouth 2 (two) times daily.       . Multiple Vitamin (MULTIVITAMIN) tablet Take 1 tablet by mouth daily.        . mycophenolate (CELLCEPT) 500 MG tablet Take by mouth 2 (two) times daily.        . Omeprazole 20 MG TBEC Take by mouth daily.        Marland Kitchen oxyCODONE (OXY IR/ROXICODONE) 5 MG immediate release tablet Take 1 tablet by mouth as needed.      Marland Kitchen oxyCODONE-acetaminophen (PERCOCET) 7.5-500 MG per tablet Take 1 tablet by mouth every 4 (four) hours as needed.        Marland Kitchen PLAVIX 75 MG tablet TAKE ONE (1) TABLET(S) ONCE DAILY  30 tablet  6  . predniSONE (DELTASONE) 5 MG tablet Take 5 mg by mouth daily.        Marland Kitchen pyridostigmine (MESTINON) 60 MG tablet Take 1 and 1/2 tablets twice a day.  90 tablet  6  . sitaGLIPtan (JANUVIA) 100 MG tablet Take 100 mg by mouth daily.        . tacrolimus (PROGRAF) 5 MG capsule  Take 5 mg by mouth 2 (two) times daily.        . Tamsulosin HCl (FLOMAX) 0.4 MG CAPS Take by mouth daily.        . temazepam (RESTORIL)  15 MG capsule Take 15 mg by mouth at bedtime as needed.        . Testosterone (ANDROGEL PUMP TD) Place onto the skin as directed.        . tiotropium (SPIRIVA HANDIHALER) 18 MCG inhalation capsule Place 1 capsule (18 mcg total) into inhaler and inhale daily.  30 capsule  2  . vitamin B-12 (CYANOCOBALAMIN) 500 MCG tablet Take 500 mcg by mouth daily.        Marland Kitchen DISCONTD: pyridostigmine (MESTINON) 60 MG tablet Take 1 tablet (60 mg total) by mouth 2 (two) times daily.  60 tablet  6  . DISCONTD: mometasone (ASMANEX 120 METERED DOSES) 220 MCG/INH inhaler Inhale 2 puffs into the lungs daily.  1 Inhaler  0    BP 136/74  Pulse 81  Ht 6\' 2"  (1.88 m)  Wt 210 lb (95.255 kg)  BMI 26.96 kg/m2 General: NAD, overweight Neck: No JVD, no thyromegaly or thyroid nodule.  Lungs: Somewhat distant breath sounds bilaterally. Decreased breath sounds at right base persist.  CV: Nondisplaced PMI.  Heart regular S1/S2, no S3/S4, 2/6 SEM RUSB.  Trace ankle edema.  Soft right carotid bruit.  Normal pedal pulses.  Abdomen: Soft, nontender, no hepatosplenomegaly, no distention.  Neurologic: Alert and oriented x 3.  Psych: Normal affect. Extremities: No clubbing or cyanosis.

## 2010-08-11 NOTE — Assessment & Plan Note (Signed)
Patient has a right-sided carotid bruit.  Will get carotid dopplers.

## 2010-08-11 NOTE — Patient Instructions (Addendum)
Increase pyridostigamine to 90mg  twice a day. This will be one and one-half 60mg  tablets twice a day.  Lab today--CBC/TSH/BMP 786.09  428.32  414.05  Schedule an appointment for a carotid doppler.  Schedule a appointment to see Dr Shirlee Latch in 3 weeks.

## 2010-08-11 NOTE — Assessment & Plan Note (Signed)
Patient continues to have dyspnea with moderate exertion.  This has been worse since about 1/12.  At last appointment, I increased his Lasix with a fall in his BNP.  Echo showed preserved EF with moderate LVH. He had a right-sided thoracentesis.  In-office spirometry suggested COPD, but he was unable to tolerate Spiriva.  With these steps, his breathing more or less remained stably impaired and he does not feel much better.  He has lost 4 lbs.  I do suspect that his dyspnea is multifactorial: diastolic CHF, COPD, pleural effusion, deconditioning, and possibly ischemia.  I think that he is now close to euvolemic.   - He will see Dr. Delford Field again next week.  Hopefully he will be able to get on an inhaler regimen that he can tolerate.  He still has decreased breath sounds at the right base, so a repeat CXR will likely be in order to see if repeat thoracentesis would be an option.  - Continue Lasix at current dose.  - I would like him to go back to regular exercise on his stationary bike as I suspect some amount of his symptoms is related to deconditioning.  - Myoview was suggestive of possible inferior ischemia (myoview before that showed inferior scar).   I will see him back in three weeks.  If he is worsening or not improving, we may need to consider repeat left heart cath though I would like to avoid this if possible.

## 2010-08-11 NOTE — Assessment & Plan Note (Signed)
This has been a bothersome symptom for him in the past.  He has been noting some orthostatic symptoms recently.  Last time this worsened, he had considerable improvement by increasing his pyridostigmine.  I will increase this to 90 mg bid today.

## 2010-08-11 NOTE — Assessment & Plan Note (Signed)
Per Dr. Clifton James.  No claudication.

## 2010-08-12 ENCOUNTER — Telehealth: Payer: Self-pay | Admitting: *Deleted

## 2010-08-12 NOTE — Telephone Encounter (Signed)
Low K diet

## 2010-08-12 NOTE — Telephone Encounter (Signed)
John [name], mr Legore not taking any K+--what to do next?--thanks nt

## 2010-08-12 NOTE — Telephone Encounter (Signed)
Called and spoke to John [name] and advised i was going to send a copy of a low K diet--John [name] agrees--nt

## 2010-08-16 ENCOUNTER — Encounter: Payer: Self-pay | Admitting: Cardiology

## 2010-08-16 NOTE — Progress Notes (Signed)
See phone note dated 08/12/10

## 2010-08-17 ENCOUNTER — Encounter: Payer: Self-pay | Admitting: Critical Care Medicine

## 2010-08-18 ENCOUNTER — Ambulatory Visit (HOSPITAL_BASED_OUTPATIENT_CLINIC_OR_DEPARTMENT_OTHER)
Admission: RE | Admit: 2010-08-18 | Discharge: 2010-08-18 | Disposition: A | Discharge status: Home / Self Care | Payer: Medicare Other | Source: Ambulatory Visit | Attending: Critical Care Medicine | Admitting: Critical Care Medicine

## 2010-08-18 ENCOUNTER — Encounter: Payer: Self-pay | Admitting: Critical Care Medicine

## 2010-08-18 ENCOUNTER — Ambulatory Visit (INDEPENDENT_AMBULATORY_CARE_PROVIDER_SITE_OTHER): Payer: Medicare Other | Admitting: Critical Care Medicine

## 2010-08-18 DIAGNOSIS — J449 Chronic obstructive pulmonary disease, unspecified: Secondary | ICD-10-CM | POA: Insufficient documentation

## 2010-08-18 DIAGNOSIS — R0602 Shortness of breath: Secondary | ICD-10-CM | POA: Insufficient documentation

## 2010-08-18 DIAGNOSIS — J90 Pleural effusion, not elsewhere classified: Secondary | ICD-10-CM | POA: Insufficient documentation

## 2010-08-18 DIAGNOSIS — N189 Chronic kidney disease, unspecified: Secondary | ICD-10-CM | POA: Insufficient documentation

## 2010-08-18 MED ORDER — FLUTICASONE-SALMETEROL 250-50 MCG/DOSE IN AEPB: 1.0000 | INHALATION_SPRAY | Freq: Two times a day (BID) | RESPIRATORY_TRACT | Status: DC

## 2010-08-18 NOTE — Patient Instructions (Signed)
Obtain a chest xray today after checkout, I will call with results Stop Spiriva Start Advair one puff twice daily I will get results on the overnight oximetry and call Return [place] 1 month

## 2010-08-18 NOTE — Progress Notes (Signed)
Subjective:    Patient ID: John [name], male    DOB: [birth year], 70 y.o.   MRN: [id number]  HPI  70 yo with history of renal transplant on chronic immunosuppressants, DM-insulin dependent, HTN, , Chronic back pain and CAD s/p CABG . Patient had a long and complicated hospital admission in 1/11 s/p CABG x 5.   Post-op course was complicated by pulmonary edema requiring intubation, renal failure requiring CVVH, and atrial fibrillation. He was in the hospital about 7 weeks. In 11/11, patient had right SFA and popliteal atherectomy that was complicated by a groin pseudoaneurysm. Hx of orthostatic hypotension, ShyDrager, and severe tremor followed by D[facility]meier    07/14/10 -  presents with pleural effusion. The patient has no history of history of diagnosed COPD, shortness of breath, chest tightness, chest pain worse with breathing and coughing, wheezing, cough, mucous production, nocturnal awakening, exercise induced symptoms, and congestion. The patient comes in today for initial evaluation of pleural effusion. Evaluation to date has included CXR. The chest X-ray reveals a right pleural effusion.  This pt has a loculated R pleural effusion on CXR. This pt had a complicated course after CABG 2/11. Last CXR showed effusion R lung 3/11. No films since. PT is s/p renal TPL 2002. There are no new symptoms, no cough, no chest pain. No pulm complaints at all PT has fatigue in rehab only.  Pt seen today 07/14/10 as a workin for ongoing dyspnea. In 11/11 we elected to watch the effusion on R.  See symptoms as above for dyspnea and cough  >>right thoracentesis and Avelox.   07/26/10 Follow up  Pt returns for 2 week follow up. Seen last ov with persistent DOE, dry cough and weakness. Set up for a right thoracentesis w/ 0.5 liters pulled off.  Path was neg for malignant cells. NO growth on cx.  Post thoracetesis film with decreased effusion size. Was given Avelox for 5 days . Pt says he could take deeper breath  but has seen no real change in his dyspnea level.   Main complaint is he is so fatigued that he wears out with minimal activity that is associated with dyspnea. He gets fatigued and winded just getting ready for bed. Most trouble is with inclines and if he has to exert himself.   Labs last ov were essentially unremarbable with improved BNP ~150. Hbg at 11.4. TSH nml.   He is on numerous meds incluiding ACE inhbitor. Says his dry cough is minimal and does not consider bothersome. He denies wheeze.  He was given Asmanex last ov but did not feel it helped.   Today we did in office spirometry. Pt had great difficulty completing this test. Poor quality -FEV1 1.35  l/m (35%) He smoked on/off for >20 years >1PPD beginning at age 16.   He does snore some and feels tired in am. Significant fatigue ? Daytime hypersolomenence.   08/18/2010 At last ov   The Np recommended Suspect this is multifactoral in nature with underlying diastolic dysfunction. Fluid volume appears improved with  Increased diuretics. However he is very complicated with hx of orhtostatic hypotension (on ACE inhibitor and diurectics)  He complains severe fatigue which could be side effect of polypharmacy -he is on chronic pain med, b/p med, diuretics, parkinson meds which could all be contributing.  His spirometry was poor quality due to inability to give a good exhalation however it does show decreased lung fnx. Maybe once he is  A little stronger will set  up for a full PFT - FEV1 today shows 1.35l/m (35%). He has a hx of heavy smoking-will begin Spiriva today -2 sample given.  Set up for an overnight oximetry test. He does desat minimally at 89% which does not qualify him for O2.  Will await ONO results. Also may want to consider sleep test in future - he does have some risk factors for this.  Have him come back in 2 weeks for close follow up .   Constipation with spiriva now.  Stomach is upset and burping all the time.  Notes pain  in pit of the stomach.  Notes constant spiriva. Could breathe deeper.     Past Medical History:  1. Diabetes mellitus  2. CKD s/p renal transplant 2002  3. HTN  4. PAD: Recent atherectomy right SFA and right popliteal on 01/12/10. Complicated by pseudoaneurysm.  5. Lumbar disc disease with low back pain  6. Diabetic gastroparesis  7. Prostate cancer s/p seed implantation in 1/06.  8. Erectile dysfunction  9. Tremor  10. Obesity  11. Diastolic CHF: Last echo (4/11) with inferior and septal hypokinesis, moderate LV hypertrophy, EF 45-50%, mild left atrial enlargement.  12. CAD: Lexiscan myoview (2/11) with EF 44%, fixed inferior defect. Cath (2/11) with 60% distal left main, 90% ostial LAD involving D1, 95% ostial large ramus, probable significant ostial CFX stenosis, 90% PDA, 70% PLV. Paitent had CABG with LIMA-LAD, SVG-D, sequential SVG-ramus and OM2, SVG-PLV. Lexsican myoview (5/12) with EF 59%, partially reversible inferior defect.  13. Atrial fibrillation post-op CABG  14. Orthostatic hypotension: Some improvement with pyridostigmine.  15. Low testosterone  16. Loculated right>left pleural effusion    Review of Systems  Constitutional:   No  weight loss, night sweats,  Fevers, chills,  fatigue, or  lassitude.  HEENT:   No headaches,  Difficulty swallowing,  Tooth/dental problems, or  Sore throat,                No sneezing, itching, ear ache, nasal congestion, post nasal drip,   CV:  No chest pain,   +Orthopnea, PND, swelling in lower extremities,  , dizziness,     GI  No heartburn, indigestion, abdominal pain, nausea, vomiting, diarrhea, change in bowel habits, loss of appetite, bloody stools.   Resp: +shortness of breath with exertion or at rest.  No excess mucus, no productive cough,  + non-productive cough,  No coughing up of blood.  No change in color of mucus.  No wheezing.  No chest wall deformity  Skin: no rash or lesions.  GU: no dysuria, change in color of urine,  no urgency or frequency.  No flank pain, no hematuria   MS:  +joint pain   +chronic  back pain.  Psych:  No change in mood or affect. No depression or anxiety.  No memory loss.          Objective:   Physical Exam  GEN: A/Ox3; pleasant , NAD, chronically ill appearing.   HEENT:  [place]/AT,  EACs-clear, TMs-wnl, NOSE-clear, THROAT-clear, no lesions, no postnasal drip or exudate noted.   NECK:  Supple w/ fair ROM; no JVD; normal carotid impulses w/o bruits; no thyromegaly or nodules palpated; no lymphadenopathy.  RESP  Coarse BS w/ no wheezing diminshed BS in bases no accessory muscle use, no dullness to percussion  CARD:  RRR, no m/r/g  , Tr -1+ peripheral edema, pulses intact, no cyanosis or clubbing.  GI:   Soft & nt; nml bowel sounds; no organomegaly  or masses detected. Insulin pump  Musco: Warm bil, no deformities or joint swelling noted.   Neuro: alert, no focal deficits noted.   Hand tremor w/ "pill rolling" , shuffling gait.   Skin: Warm, multiple ecccymotic spots along arms.          Assessment & Plan:   COPD (chronic obstructive pulmonary disease) Chronic obstruction of airways Failed Spiriva d/t side effects Plan Start Advair D/c spiriva   PLEURAL EFFUSION, RIGHT Loculated pleural effusions Plan  Continue monitoring of effusions    Updated Medication List Outpatient Encounter Prescriptions as of 08/18/2010  Medication Sig Dispense Refill  . Acetylcysteine (NAC 600 PO) Take by mouth 2 (two) times daily.        Marland Kitchen aspirin EC 81 MG tablet Take 1 tablet (81 mg total) by mouth daily.  150 tablet  2  . atorvastatin (LIPITOR) 40 MG tablet Take 40 mg by mouth daily.        . Calcium Carb-Cholecalciferol (CALCIUM 1000 + D PO) Take 1,000 mg by mouth daily.        . carbidopa-levodopa (SINEMET CR) 25-100 MG per tablet Take 1 tablet by mouth 3 (three) times daily.        . Cholecalciferol (VITAMIN D) 1000 UNITS capsule Take 1,000 Units by mouth daily.        .  Cholecalciferol (VITAMIN D3) 1000 UNITS CAPS Take 10,000 mg by mouth. Twice a week.      . DULoxetine (CYMBALTA) 60 MG capsule Take 60 mg by mouth daily.        Marland Kitchen ezetimibe (ZETIA) 10 MG tablet Take 10 mg by mouth daily.        . Ferrous Sulfate (IRON) 325 (65 FE) MG TABS Take by mouth daily.        . folic acid (FOLVITE) 800 MCG tablet Take 800 mcg by mouth daily.        . furosemide (LASIX) 40 MG tablet Take 1 and 1/2 tablets twice a day  90 tablet  6  . Insulin Aspart (NOVOLOG [place]) Inject into the skin. Via pump as directed        . LamoTRIgine (LAMICTAL ODT PO) Take by mouth as directed.        Marland Kitchen lisinopril (PRINIVIL,ZESTRIL) 2.5 MG tablet Take 2.5 mg by mouth daily.        . magnesium oxide (MAG-OX) 400 MG tablet Take 400 mg by mouth 2 (two) times daily.       . Multiple Vitamin (MULTIVITAMIN) tablet Take 1 tablet by mouth daily.        . mycophenolate (CELLCEPT) 500 MG tablet Take by mouth 2 (two) times daily.        . Omeprazole 20 MG TBEC Take by mouth daily.       Marland Kitchen oxyCODONE (OXY IR/ROXICODONE) 5 MG immediate release tablet Take 1 tablet by mouth as needed.      Marland Kitchen oxyCODONE-acetaminophen (PERCOCET) 7.5-500 MG per tablet Take 1 tablet by mouth every 4 (four) hours as needed.        Marland Kitchen PLAVIX 75 MG tablet TAKE ONE (1) TABLET(S) ONCE DAILY  30 tablet  6  . predniSONE (DELTASONE) 5 MG tablet Take 5 mg by mouth daily.        Marland Kitchen pyridostigmine (MESTINON) 60 MG tablet Take 1 and 1/2 tablets twice a day.  90 tablet  6  . sitaGLIPtan (JANUVIA) 100 MG tablet Take 100 mg by mouth daily.        Marland Kitchen  tacrolimus (PROGRAF) 5 MG capsule Take 5 mg by mouth 2 (two) times daily.        . Tamsulosin HCl (FLOMAX) 0.4 MG CAPS Take by mouth daily.        . temazepam (RESTORIL) 15 MG capsule Take 15 mg by mouth at bedtime as needed.        . vitamin B-12 (CYANOCOBALAMIN) 500 MCG tablet Take 500 mcg by mouth daily.        Marland Kitchen DISCONTD: tiotropium (SPIRIVA HANDIHALER) 18 MCG inhalation capsule Place 1 capsule (18  mcg total) into inhaler and inhale daily.  30 capsule  2  . Fluticasone-Salmeterol (ADVAIR) 250-50 MCG/DOSE AEPB Inhale 1 puff into the lungs 2 (two) times daily.  1 each  6  . pantoprazole (PROTONIX) 40 MG tablet Take 40 mg by mouth daily.        Marland Kitchen DISCONTD: Testosterone (ANDROGEL PUMP TD) Place onto the skin as directed.

## 2010-08-19 ENCOUNTER — Encounter (INDEPENDENT_AMBULATORY_CARE_PROVIDER_SITE_OTHER): Payer: Medicare Other | Admitting: Cardiology

## 2010-08-19 DIAGNOSIS — R0989 Other specified symptoms and signs involving the circulatory and respiratory systems: Secondary | ICD-10-CM

## 2010-08-19 DIAGNOSIS — I6529 Occlusion and stenosis of unspecified carotid artery: Secondary | ICD-10-CM

## 2010-08-21 NOTE — Assessment & Plan Note (Signed)
Loculated pleural effusions Plan  Continue monitoring of effusions

## 2010-08-21 NOTE — Assessment & Plan Note (Signed)
Chronic obstruction of airways Failed Spiriva d/t side effects Plan Start Advair D/c spiriva

## 2010-08-25 ENCOUNTER — Other Ambulatory Visit: Payer: Self-pay | Admitting: Internal Medicine

## 2010-08-25 ENCOUNTER — Other Ambulatory Visit: Payer: Self-pay | Admitting: Critical Care Medicine

## 2010-08-25 DIAGNOSIS — J449 Chronic obstructive pulmonary disease, unspecified: Secondary | ICD-10-CM

## 2010-08-25 NOTE — Progress Notes (Signed)
ONO on RA shows significant desat to low 80s.  Pt agreeable to nocturnal oxygen 2L.  Will use [facility].

## 2010-08-26 ENCOUNTER — Telehealth: Payer: Self-pay | Admitting: *Deleted

## 2010-08-26 MED ORDER — ALBUTEROL SULFATE HFA 108 (90 BASE) MCG/ACT IN AERS: 2.0000 | INHALATION_SPRAY | Freq: Four times a day (QID) | RESPIRATORY_TRACT | Status: DC | PRN

## 2010-08-26 NOTE — Telephone Encounter (Signed)
Fax received from [facility] stating pt is requesting  Ventolin HFA for rescue purposes. I don't see that the pt has ever been given  This in the past. Dr. Delford Field is out of the office. Will forward msg to Dr. Sherene Sires, doc of the day, for approval or denial.No Known Allergies

## 2010-08-26 NOTE — Telephone Encounter (Signed)
Refill sent. [name], CMA  

## 2010-08-26 NOTE — Telephone Encounter (Signed)
Ok x one but no refills

## 2010-08-29 ENCOUNTER — Ambulatory Visit (INDEPENDENT_AMBULATORY_CARE_PROVIDER_SITE_OTHER): Payer: Medicare Other | Admitting: Cardiology

## 2010-08-29 ENCOUNTER — Encounter: Payer: Self-pay | Admitting: Cardiology

## 2010-08-29 DIAGNOSIS — R0600 Dyspnea, unspecified: Secondary | ICD-10-CM | POA: Insufficient documentation

## 2010-08-29 DIAGNOSIS — I25810 Atherosclerosis of coronary artery bypass graft(s) without angina pectoris: Secondary | ICD-10-CM

## 2010-08-29 DIAGNOSIS — I951 Orthostatic hypotension: Secondary | ICD-10-CM

## 2010-08-29 DIAGNOSIS — I7389 Other specified peripheral vascular diseases: Secondary | ICD-10-CM

## 2010-08-29 DIAGNOSIS — R0609 Other forms of dyspnea: Secondary | ICD-10-CM

## 2010-08-29 NOTE — Assessment & Plan Note (Signed)
Symptoms improved with increase in pyridostigmine.

## 2010-08-29 NOTE — Patient Instructions (Signed)
Schedule an appointment to see Dr Shirlee Latch in 3 months.

## 2010-08-29 NOTE — Assessment & Plan Note (Signed)
Suspect this is multifactorial, related to diastolic CHF, deconditioning, COPD, and loculated pleural effusions.  His fatigue was likely also contributed to by nocturnal hypoxemia.  Symptoms have improved.  Continue oxygen at night, current Lasix, and Advair.  He will continue regular exercise.

## 2010-08-29 NOTE — Assessment & Plan Note (Signed)
Patient is s/p CABG.  No chest pain (never had CP before), but recent increased exertional dyspnea.  Steffanie Dunn showed a partially reversible inferior defect this month, while myoview in 2/11 in the hospital prior to CABG showed a fixed inferior defect.  I am not sure if there has been any true change. Given improvement in symptoms, I do not think that catheterization is likely to be helpful.  - Continue ASA, Plavix, lisinopril, statin.  - LDL < 70 when recently checked (at goal).

## 2010-08-29 NOTE — Progress Notes (Signed)
PCP: Dr. [name]  70 yo with history of renal transplant, DM, HTN, and CAD s/p CABG returns for followup.  Patient had a long and complicated hospital admission in 1/11.  Patient was admitted to [facility] at that time with a UTI and pulmonary edema.  Troponin was noted to be elevated and the patient had a nuclear scan with a large fixed inferior defect.  He ended up having a cardiac catheterization showing severe disease involving the LM, LAD, ramus, CFX and RCA.  He had CABG x 5.  Post-op course was complicated by pulmonary edema requiring intubation, renal failure requiring CVVH, and atrial fibrillation.  He was in the hospital about 7 weeks.  In 11/11, patient had right SFA and popliteal atherectomy that was complicated by a groin pseudoaneurysm.   For the last several months, patient had been more short of breath.  Lexiscan myoview in 5/12 showed a partially reversible inferior defect.  Patient would get winded with moderate activity such as walking up steps or walking fast. He would do ok with mild activity like riding his exercise bike or walking slowly.  Echo 6/12 showed preserved EF of 60%, moderate LVH, normal RV, and very mild AS.  I increased his Lasix, with some weight loss.  He saw Dr. Delford Field and had right-sided thoracentesis with removal of 500 cc of fluid.  Cytology was negative for malignancy.  In-office FEV1 was quite low (35% predicted), so he was started on Spiriva. He was unable to tolerate Spiriva due to nausea and is now on Advair.  Given recurrence of orthostatic hypotension symptoms, I also increased his pyridostigmine.   Since last appointment, John [name] seems to be doing considerably better.  He has been started on oxygen at night and is much less fatigued and sleepy.  He is exercising more and is up to 30-40 minutes daily on his exercise bike.  He is still short of breath with more heavy exertion such as carrying a heavy load.  However, overall, his breathing seems much improved.  He  has had no chest pain.  Increasing the pyridostigmine seems to have resolved the orthostatic symptoms.  Finally, patient has been having some swallowing difficulty, especially pills, and epigastric burning after eating or taking pills.  He has been on omeprazole at home.   Labs (9/11): LDL 71, HDL 45 Labs (12/11): K 4.9, creatinine 0.9, HCT 37.1  Labs (5/12): K 4.9, creatinine 0.9, BNP 157, HCT 36.4 Labs (5/12): LDL 42, HDL 44, K 3.7, creatinine 1.1, BNP 105 Labs (6/12): K 5, creatinine 1.1, TSH normal  Allergies (verified):  No Known Drug Allergies  Past Medical History: 1. Diabetes mellitus 2. CKD s/p renal transplant 2002 3. HTN 4. PAD: Recent atherectomy right SFA and right popliteal on 01/12/10.  Complicated by pseudoaneurysm.  5. Lumbar disc disease with low back pain 6. Diabetic gastroparesis 7. Prostate cancer s/p seed implantation in 1/06.  8. Erectile dysfunction 9. Tremor 10.  Obesity 11.  Diastolic CHF: Last echo (6/12) showed EF 60%, moderate LV hypertrophy, very mild AS, normal RV size and systolic function. 12.  CAD: Lexiscan myoview (2/11) with EF 44%,  fixed inferior defect.  Cath (2/11) with 60% distal left main, 90% ostial LAD involving D1, 95% ostial large ramus, probable significant ostial CFX stenosis, 90% PDA, 70% PLV.  Paitent had CABG with LIMA-LAD, SVG-D, sequential SVG-ramus and OM2, SVG-PLV.  Lexiscan myoview (5/12) with EF 59%, partially reversible inferior defect.  13.  Atrial fibrillation post-op CABG 14.  Orthostatic hypotension:  improvement with pyridostigmine.  15.  Low testosterone 16.  Loculated right>left pleural effusion: Right thoracentesis (6/12), cytology negative for malignancy.  17.  COPD: Suspected.  FEV1 35% (in-office test).   18.  Carotid dopplers (6/12): 0-39% bilateral ICA stenosis.   Family History: Noncontributory  Social History: Married, retired Clinical research associate, lives in [place].  Prior smoker.  Review of Systems        All  systems reviewed and negative except as per HPI.   Current Outpatient Prescriptions  Medication Sig Dispense Refill  . Acetylcysteine (NAC 600 PO) Take by mouth 2 (two) times daily.        Marland Kitchen albuterol (PROVENTIL HFA;VENTOLIN HFA) 108 (90 BASE) MCG/ACT inhaler Inhale 2 puffs into the lungs every 6 (six) hours as needed for wheezing.  1 Inhaler  0  . aspirin EC 81 MG tablet Take 1 tablet (81 mg total) by mouth daily.  150 tablet  2  . atorvastatin (LIPITOR) 40 MG tablet Take 40 mg by mouth daily.        . Calcium Carb-Cholecalciferol (CALCIUM 1000 + D PO) Take 1,000 mg by mouth daily.        . carbidopa-levodopa (SINEMET CR) 25-100 MG per tablet Take 1 tablet by mouth 3 (three) times daily.        . Cholecalciferol (VITAMIN D) 1000 UNITS capsule Take 1,000 Units by mouth daily.        . Cholecalciferol (VITAMIN D3) 1000 UNITS CAPS Take 10,000 mg by mouth. Twice a week.      . DULoxetine (CYMBALTA) 60 MG capsule Take 60 mg by mouth daily.        Marland Kitchen ezetimibe (ZETIA) 10 MG tablet Take 10 mg by mouth daily.        . Ferrous Sulfate (IRON) 325 (65 FE) MG TABS Take by mouth daily.        . Fluticasone-Salmeterol (ADVAIR) 250-50 MCG/DOSE AEPB Inhale 1 puff into the lungs 2 (two) times daily.  1 each  6  . folic acid (FOLVITE) 800 MCG tablet Take 800 mcg by mouth daily.        . furosemide (LASIX) 40 MG tablet Take 1 and 1/2 tablets twice a day  90 tablet  6  . Insulin Aspart (NOVOLOG [place]) Inject into the skin. Via pump as directed        . LamoTRIgine (LAMICTAL ODT PO) Take by mouth as directed.        Marland Kitchen lisinopril (PRINIVIL,ZESTRIL) 2.5 MG tablet Take 2.5 mg by mouth daily.        . magnesium oxide (MAG-OX) 400 MG tablet Take 400 mg by mouth 2 (two) times daily.       . Multiple Vitamin (MULTIVITAMIN) tablet Take 1 tablet by mouth daily.        . mycophenolate (CELLCEPT) 500 MG tablet Take by mouth 2 (two) times daily.        Marland Kitchen omeprazole (PRILOSEC) 20 MG capsule Take 1 tablet by mouth daily.      Marland Kitchen  PLAVIX 75 MG tablet TAKE ONE (1) TABLET(S) ONCE DAILY  30 tablet  6  . predniSONE (DELTASONE) 5 MG tablet Take 5 mg by mouth daily.        Marland Kitchen pyridostigmine (MESTINON) 60 MG tablet Take 1 and 1/2 tablets twice a day.  90 tablet  6  . sitaGLIPtan (JANUVIA) 100 MG tablet Take 100 mg by mouth daily.        . tacrolimus (PROGRAF)  5 MG capsule Take 5 mg by mouth 2 (two) times daily.        . Tamsulosin HCl (FLOMAX) 0.4 MG CAPS Take by mouth daily.        . temazepam (RESTORIL) 15 MG capsule Take 15 mg by mouth at bedtime as needed.        . vitamin B-12 (CYANOCOBALAMIN) 500 MCG tablet Take 500 mcg by mouth daily.        Marland Kitchen DISCONTD: oxyCODONE (OXY IR/ROXICODONE) 5 MG immediate release tablet Take 1 tablet by mouth as needed.      Marland Kitchen DISCONTD: oxyCODONE-acetaminophen (PERCOCET) 7.5-500 MG per tablet Take 1 tablet by mouth every 4 (four) hours as needed.        Marland Kitchen DISCONTD: pantoprazole (PROTONIX) 40 MG tablet Take 40 mg by mouth daily.          BP 114/50  Pulse 90  Ht 6\' 2"  (1.88 m)  Wt 208 lb (94.348 kg)  BMI 26.71 kg/m2 General: NAD, overweight Neck: No JVD, no thyromegaly or thyroid nodule.  Lungs: Somewhat distant breath sounds bilaterally. Decreased breath sounds at right base persist.  CV: Nondisplaced PMI.  Heart regular S1/S2, no S3/S4, 2/6 SEM RUSB.  No ankle edema.  Soft right carotid bruit.  Normal pedal pulses.  Abdomen: Soft, nontender, no hepatosplenomegaly, no distention.  Neurologic: Alert and oriented x 3.  Psych: Normal affect. Extremities: No clubbing or cyanosis.

## 2010-08-29 NOTE — Assessment & Plan Note (Signed)
Per Dr. McAlhany.  No claudication.  

## 2010-09-01 ENCOUNTER — Encounter: Payer: Self-pay | Admitting: Critical Care Medicine

## 2010-09-01 ENCOUNTER — Ambulatory Visit
Admission: RE | Admit: 2010-09-01 | Discharge: 2010-09-01 | Disposition: A | Discharge status: Home / Self Care | Payer: Medicare Other | Source: Ambulatory Visit | Attending: Internal Medicine | Admitting: Internal Medicine

## 2010-09-14 ENCOUNTER — Telehealth: Payer: Self-pay | Admitting: Adult Health

## 2010-09-14 ENCOUNTER — Encounter: Payer: Self-pay | Admitting: Adult Health

## 2010-09-14 NOTE — Telephone Encounter (Signed)
Error

## 2010-09-15 ENCOUNTER — Ambulatory Visit (INDEPENDENT_AMBULATORY_CARE_PROVIDER_SITE_OTHER): Payer: Medicare Other | Admitting: Critical Care Medicine

## 2010-09-15 ENCOUNTER — Encounter: Payer: Self-pay | Admitting: Critical Care Medicine

## 2010-09-15 VITALS — BP 120/50 | HR 76 | Temp 97.9°F | Ht 74.0 in | Wt 210.0 lb

## 2010-09-15 DIAGNOSIS — J449 Chronic obstructive pulmonary disease, unspecified: Secondary | ICD-10-CM

## 2010-09-15 MED ORDER — TIOTROPIUM BROMIDE MONOHYDRATE 18 MCG IN CAPS: 18.0000 ug | ORAL_CAPSULE | Freq: Every day | RESPIRATORY_TRACT | Status: DC

## 2010-09-15 NOTE — Patient Instructions (Signed)
Stop Advair Start Spiriva daily, use samples Return 3 months

## 2010-09-15 NOTE — Progress Notes (Signed)
Subjective:    Patient ID: John [name], male    DOB: Sep 22, 1940, 70 y.o.   MRN: [id number]  HPI  70 yo with history of renal transplant on chronic immunosuppressants, DM-insulin dependent, HTN, , Chronic back pain and CAD s/p CABG . Patient had a long and complicated hospital admission in 1/11 s/p CABG x 5.   Post-op course was complicated by pulmonary edema requiring intubation, renal failure requiring CVVH, and atrial fibrillation. He was in the hospital about 7 weeks. In 11/11, patient had right SFA and popliteal atherectomy that was complicated by a groin pseudoaneurysm. Hx of orthostatic hypotension, ShyDrager, and severe tremor followed by Dohmeier    07/14/10 -  presents with pleural effusion. The patient has no history of history of diagnosed COPD, shortness of breath, chest tightness, chest pain worse with breathing and coughing, wheezing, cough, mucous production, nocturnal awakening, exercise induced symptoms, and congestion. The patient comes in today for initial evaluation of pleural effusion. Evaluation to date has included CXR. The chest X-ray reveals a right pleural effusion.  This pt has a loculated R pleural effusion on CXR. This pt had a complicated course after CABG 2/11. Last CXR showed effusion R lung 3/11. No films since. PT is s/p renal TPL 2002. There are no new symptoms, no cough, no chest pain. No pulm complaints at all PT has fatigue in rehab only.  Pt seen today 07/14/10 as a workin for ongoing dyspnea. In 11/11 we elected to watch the effusion on R.  See symptoms as above for dyspnea and cough  >>right thoracentesis and Avelox.   07/26/10 Follow up  Pt returns for 2 week follow up. Seen last ov with persistent DOE, dry cough and weakness. Set up for a right thoracentesis w/ 0.5 liters pulled off.  Path was neg for malignant cells. NO growth on cx.  Post thoracetesis film with decreased effusion size. Was given Avelox for 5 days . Pt says he could take deeper breath  but has seen no real change in his dyspnea level.   Main complaint is he is so fatigued that he wears out with minimal activity that is associated with dyspnea. He gets fatigued and winded just getting ready for bed. Most trouble is with inclines and if he has to exert himself.   Labs last ov were essentially unremarbable with improved BNP ~150. Hbg at 11.4. TSH nml.   He is on numerous meds incluiding ACE inhbitor. Says his dry cough is minimal and does not consider bothersome. He denies wheeze.  He was given Asmanex last ov but did not feel it helped.   Today we did in office spirometry. Pt had great difficulty completing this test. Poor quality -FEV1 1.35  l/m (35%) He smoked on/off for >20 years >1PPD beginning at age 20.   He does snore some and feels tired in am. Significant fatigue ? Daytime hypersolomenence.   08/18/10 At last ov   The Np recommended Suspect this is multifactoral in nature with underlying diastolic dysfunction. Fluid volume appears improved with  Increased diuretics. However he is very complicated with hx of orhtostatic hypotension (on ACE inhibitor and diurectics)  He complains severe fatigue which could be side effect of polypharmacy -he is on chronic pain med, b/p med, diuretics, parkinson meds which could all be contributing.  His spirometry was poor quality due to inability to give a good exhalation however it does show decreased lung fnx. Maybe once he is  A little stronger will set  up for a full PFT - FEV1 today shows 1.35l/m (35%). He has a hx of heavy smoking-will begin Spiriva today -2 sample given.  Set up for an overnight oximetry test. He does desat minimally at 89% which does not qualify him for O2.  Will await ONO results. Also may want to consider sleep test in future - he does have some risk factors for this.  Have him come back in 2 weeks for close follow up .   Constipation with spiriva now.  Stomach is upset and burping all the time.  Notes pain in  pit of the stomach.  Notes constant spiriva. Could breathe deeper.  Cant get pills down, food lodges and has to throw up.   No chest pain  Dr [name] wants copy of records  7/26 Pills lodge in esophagus and will cough.  Saw PCP.  C/o dysphagia.  Had Bar swallow   Past Medical History:  1. Diabetes mellitus  2. CKD s/p renal transplant 2002 Dr [name]  3. HTN  4. PAD: Recent atherectomy right SFA and right popliteal on 01/12/10. Complicated by pseudoaneurysm.  5. Lumbar disc disease with low back pain  6. Diabetic gastroparesis  7. Prostate cancer s/p seed implantation in 1/06.  8. Erectile dysfunction  9. Tremor  10. Obesity  11. Diastolic CHF: Last echo (4/11) with inferior and septal hypokinesis, moderate LV hypertrophy, EF 45-50%, mild left atrial enlargement.  12. CAD: Lexiscan myoview (2/11) with EF 44%, fixed inferior defect. Cath (2/11) with 60% distal left main, 90% ostial LAD involving D1, 95% ostial large ramus, probable significant ostial CFX stenosis, 90% PDA, 70% PLV. Paitent had CABG with LIMA-LAD, SVG-D, sequential SVG-ramus and OM2, SVG-PLV. Lexsican myoview (5/12) with EF 59%, partially reversible inferior defect.  13. Atrial fibrillation post-op CABG  14. Orthostatic hypotension: Some improvement with pyridostigmine.  15. Low testosterone  16. Loculated right>left pleural effusion    Review of Systems  Constitutional:   No  weight loss, night sweats,  Fevers, chills,  fatigue, or  lassitude.  HEENT:   No headaches,  Difficulty swallowing,  Tooth/dental problems, or  Sore throat,                No sneezing, itching, ear ache, nasal congestion, post nasal drip,   CV:  No chest pain,   +Orthopnea, PND, swelling in lower extremities,  , dizziness,     GI  No heartburn, indigestion, abdominal pain, nausea, vomiting, diarrhea, change in bowel habits, loss of appetite, bloody stools.   Resp: +shortness of breath with exertion or at rest.  No excess mucus, no productive  cough,  + non-productive cough,  No coughing up of blood.  No change in color of mucus.  No wheezing.  No chest wall deformity  Skin: no rash or lesions.  GU: no dysuria, change in color of urine, no urgency or frequency.  No flank pain, no hematuria   MS:  +joint pain   +chronic  back pain.  Psych:  No change in mood or affect. No depression or anxiety.  No memory loss.          Objective:   Physical Exam  GEN: A/Ox3; pleasant , NAD, chronically ill appearing.   HEENT:  [place]/AT,  EACs-clear, TMs-wnl, NOSE-clear, THROAT-clear, no lesions, no postnasal drip or exudate noted.   NECK:  Supple w/ fair ROM; no JVD; normal carotid impulses w/o bruits; no thyromegaly or nodules palpated; no lymphadenopathy.  RESP  Coarse BS w/ no  wheezing diminshed BS in bases no accessory muscle use, no dullness to percussion  CARD:  RRR, no m/r/g  , Tr -1+ peripheral edema, pulses intact, no cyanosis or clubbing.  GI:   Soft & nt; nml bowel sounds; no organomegaly or masses detected. Insulin pump  Musco: Warm bil, no deformities or joint swelling noted.   Neuro: alert, no focal deficits noted.   Hand tremor w/ "pill rolling" , shuffling gait.   Skin: Warm, multiple ecccymotic spots along arms.          Assessment & Plan:   COPD (chronic obstructive pulmonary disease) Copd severe, pt does not like advair as much as spiriva Plan Cont spiriva D/c advair      Updated Medication List Outpatient Encounter Prescriptions as of 09/15/2010  Medication Sig Dispense Refill  . albuterol (PROVENTIL HFA;VENTOLIN HFA) 108 (90 BASE) MCG/ACT inhaler Inhale 2 puffs into the lungs every 6 (six) hours as needed for wheezing.  1 Inhaler  0  . aspirin EC 81 MG tablet Take 1 tablet (81 mg total) by mouth daily.  150 tablet  2  . atorvastatin (LIPITOR) 40 MG tablet Take 40 mg by mouth daily.        . carbidopa-levodopa (SINEMET CR) 25-100 MG per tablet Take 1 tablet by mouth 3 (three) times daily.          . Cholecalciferol (VITAMIN D) 1000 UNITS capsule Take 1,000 Units by mouth daily.        . Cholecalciferol (VITAMIN D3) 1000 UNITS CAPS Take 10,000 mg by mouth. Twice a week.      . DULoxetine (CYMBALTA) 60 MG capsule Take 60 mg by mouth daily.        Marland Kitchen ezetimibe (ZETIA) 10 MG tablet Take 10 mg by mouth daily.        . Ferrous Sulfate (IRON) 325 (65 FE) MG TABS Take by mouth daily.        . furosemide (LASIX) 40 MG tablet Take 1 and 1/2 tablets twice a day  90 tablet  6  . Insulin Aspart (NOVOLOG ) Inject into the skin. Via pump as directed        . LamoTRIgine (LAMICTAL ODT PO) Take by mouth as directed.        Marland Kitchen lisinopril (PRINIVIL,ZESTRIL) 2.5 MG tablet Take 2.5 mg by mouth daily.        . magnesium oxide (MAG-OX) 400 MG tablet Take 400 mg by mouth 2 (two) times daily.       . mycophenolate (CELLCEPT) 500 MG tablet Take by mouth 2 (two) times daily.        . pantoprazole (PROTONIX) 40 MG tablet Once daily      . PLAVIX 75 MG tablet TAKE ONE (1) TABLET(S) ONCE DAILY  30 tablet  6  . predniSONE (DELTASONE) 5 MG tablet Take 5 mg by mouth daily.        Marland Kitchen pyridostigmine (MESTINON) 60 MG tablet Take 1 and 1/2 tablets twice a day.  90 tablet  6  . sitaGLIPtan (JANUVIA) 100 MG tablet Take 100 mg by mouth daily.        . tacrolimus (PROGRAF) 5 MG capsule Take 5 mg by mouth 2 (two) times daily.        . Tamsulosin HCl (FLOMAX) 0.4 MG CAPS Take by mouth daily.        . temazepam (RESTORIL) 15 MG capsule Take 15 mg by mouth at bedtime as needed.        Marland Kitchen  DISCONTD: Fluticasone-Salmeterol (ADVAIR) 250-50 MCG/DOSE AEPB Inhale 1 puff into the lungs 2 (two) times daily.  1 each  6  . tiotropium (SPIRIVA HANDIHALER) 18 MCG inhalation capsule Place 1 capsule (18 mcg total) into inhaler and inhale daily.  30 capsule  6  . DISCONTD: Acetylcysteine (NAC 600 PO) Take by mouth 2 (two) times daily.        Marland Kitchen DISCONTD: Calcium Carb-Cholecalciferol (CALCIUM 1000 + D PO) Take 1,000 mg by mouth daily.        Marland Kitchen  DISCONTD: folic acid (FOLVITE) 800 MCG tablet Take 800 mcg by mouth daily.        Marland Kitchen DISCONTD: Multiple Vitamin (MULTIVITAMIN) tablet Take 1 tablet by mouth daily.        Marland Kitchen DISCONTD: omeprazole (PRILOSEC) 20 MG capsule Take 1 tablet by mouth daily.      Marland Kitchen DISCONTD: vitamin B-12 (CYANOCOBALAMIN) 500 MCG tablet Take 500 mcg by mouth daily.

## 2010-09-17 NOTE — Assessment & Plan Note (Signed)
Copd severe, pt does not like advair as much as spiriva Plan Cont spiriva D/c advair

## 2010-09-20 ENCOUNTER — Ambulatory Visit: Payer: Medicare Other | Attending: Neurology | Admitting: Physical Therapy

## 2010-09-20 DIAGNOSIS — R269 Unspecified abnormalities of gait and mobility: Secondary | ICD-10-CM | POA: Insufficient documentation

## 2010-09-20 DIAGNOSIS — IMO0001 Reserved for inherently not codable concepts without codable children: Secondary | ICD-10-CM | POA: Insufficient documentation

## 2010-09-21 ENCOUNTER — Telehealth: Payer: Self-pay | Admitting: Critical Care Medicine

## 2010-09-21 DIAGNOSIS — J449 Chronic obstructive pulmonary disease, unspecified: Secondary | ICD-10-CM

## 2010-09-21 NOTE — Telephone Encounter (Signed)
Spoke with Annabelle Harman and notified of recs per PW. She verbalized understanding, and order was sent to [facility] for o2 changes.

## 2010-09-21 NOTE — Telephone Encounter (Signed)
Need to change Oxygen to 3L exertion (with rehab and walking) and 2L rest and qhs

## 2010-09-21 NOTE — Telephone Encounter (Signed)
Called rehab, spoke with Annabelle Harman who states that pt was referred to [facility] rehab for PT by Dr [name].  However, when [place] walked pt 1 lap around their gym (approx the distance from vehicle to the building per Annabelle Harman) pt 02 dropped to 82% and took approx 90 seconds for pt to recover to above 90% - baseline was 93% sitting.  Annabelle Harman wonders if patient is appropriate for daytime o2 and/or pulm rehab.  Pt currently wears 2 liters at bedtime.    Dr Delford Field please advise, thanks!

## 2010-09-22 ENCOUNTER — Telehealth: Payer: Self-pay | Admitting: Critical Care Medicine

## 2010-09-22 DIAGNOSIS — J449 Chronic obstructive pulmonary disease, unspecified: Secondary | ICD-10-CM

## 2010-09-22 NOTE — Telephone Encounter (Signed)
Spoke with pt and he states wants to know if can have portable o2 from [facility]. He was upset that they did not bring him one when they discussed increasing his o2 with exertion. I advised will send order to [facility] for this. Pt verbalized understanding.

## 2010-09-30 ENCOUNTER — Other Ambulatory Visit: Payer: Self-pay | Admitting: Cardiology

## 2010-10-07 ENCOUNTER — Other Ambulatory Visit (HOSPITAL_COMMUNITY): Payer: Self-pay | Admitting: Gastroenterology

## 2010-10-08 ENCOUNTER — Ambulatory Visit (HOSPITAL_COMMUNITY): Admission: RE | Admit: 2010-10-08 | Payer: Medicare Other | Source: Ambulatory Visit

## 2010-10-08 ENCOUNTER — Ambulatory Visit (HOSPITAL_COMMUNITY)
Admission: RE | Admit: 2010-10-08 | Discharge: 2010-10-08 | Disposition: A | Discharge status: Home / Self Care | Payer: Medicare Other | Source: Ambulatory Visit | Attending: Gastroenterology | Admitting: Gastroenterology

## 2010-10-14 ENCOUNTER — Other Ambulatory Visit (HOSPITAL_COMMUNITY): Payer: Self-pay | Admitting: Gastroenterology

## 2010-10-17 ENCOUNTER — Ambulatory Visit (HOSPITAL_COMMUNITY)
Admission: RE | Admit: 2010-10-17 | Discharge: 2010-10-17 | Disposition: A | Discharge status: Home / Self Care | Payer: Medicare Other | Source: Ambulatory Visit | Attending: Gastroenterology | Admitting: Gastroenterology

## 2010-10-17 DIAGNOSIS — R1310 Dysphagia, unspecified: Secondary | ICD-10-CM | POA: Insufficient documentation

## 2010-10-30 ENCOUNTER — Other Ambulatory Visit: Payer: Self-pay | Admitting: Cardiology

## 2010-10-31 ENCOUNTER — Encounter: Payer: Medicare Other | Admitting: Cardiology

## 2010-11-09 ENCOUNTER — Encounter (INDEPENDENT_AMBULATORY_CARE_PROVIDER_SITE_OTHER): Payer: Medicare Other | Admitting: Cardiology

## 2010-11-09 DIAGNOSIS — I7389 Other specified peripheral vascular diseases: Secondary | ICD-10-CM

## 2010-11-09 DIAGNOSIS — I739 Peripheral vascular disease, unspecified: Secondary | ICD-10-CM

## 2010-12-01 ENCOUNTER — Ambulatory Visit (INDEPENDENT_AMBULATORY_CARE_PROVIDER_SITE_OTHER): Payer: Medicare Other | Admitting: Cardiology

## 2010-12-01 ENCOUNTER — Encounter: Payer: Self-pay | Admitting: Cardiology

## 2010-12-01 ENCOUNTER — Encounter: Payer: Self-pay | Admitting: *Deleted

## 2010-12-01 DIAGNOSIS — I440 Atrioventricular block, first degree: Secondary | ICD-10-CM

## 2010-12-01 DIAGNOSIS — R0609 Other forms of dyspnea: Secondary | ICD-10-CM

## 2010-12-01 DIAGNOSIS — E785 Hyperlipidemia, unspecified: Secondary | ICD-10-CM

## 2010-12-01 DIAGNOSIS — R0989 Other specified symptoms and signs involving the circulatory and respiratory systems: Secondary | ICD-10-CM

## 2010-12-01 DIAGNOSIS — R0602 Shortness of breath: Secondary | ICD-10-CM

## 2010-12-01 DIAGNOSIS — I509 Heart failure, unspecified: Secondary | ICD-10-CM

## 2010-12-01 DIAGNOSIS — I7389 Other specified peripheral vascular diseases: Secondary | ICD-10-CM

## 2010-12-01 DIAGNOSIS — R0600 Dyspnea, unspecified: Secondary | ICD-10-CM

## 2010-12-01 DIAGNOSIS — I951 Orthostatic hypotension: Secondary | ICD-10-CM

## 2010-12-01 DIAGNOSIS — I2510 Atherosclerotic heart disease of native coronary artery without angina pectoris: Secondary | ICD-10-CM

## 2010-12-01 DIAGNOSIS — N189 Chronic kidney disease, unspecified: Secondary | ICD-10-CM

## 2010-12-01 DIAGNOSIS — I5032 Chronic diastolic (congestive) heart failure: Secondary | ICD-10-CM

## 2010-12-01 DIAGNOSIS — I25810 Atherosclerosis of coronary artery bypass graft(s) without angina pectoris: Secondary | ICD-10-CM

## 2010-12-01 LAB — PROTIME-INR: Prothrombin Time: 11.8 s (ref 10.2–12.4)

## 2010-12-01 LAB — CBC WITH DIFFERENTIAL/PLATELET
Basophils Absolute: 0 10*3/uL (ref 0.0–0.1)
Eosinophils Absolute: 0.1 10*3/uL (ref 0.0–0.7)
Hemoglobin: 11.2 g/dL — ABNORMAL LOW (ref 13.0–17.0)
Lymphocytes Relative: 14.7 % (ref 12.0–46.0)
Lymphs Abs: 1.3 10*3/uL (ref 0.7–4.0)
MCHC: 32.5 g/dL (ref 30.0–36.0)
Neutro Abs: 6.8 10*3/uL (ref 1.4–7.7)
Platelets: 223 10*3/uL (ref 150.0–400.0)
RDW: 20.7 % — ABNORMAL HIGH (ref 11.5–14.6)

## 2010-12-01 LAB — LIPID PANEL
LDL Cholesterol: 53 mg/dL (ref 0–99)
VLDL: 13.2 mg/dL (ref 0.0–40.0)

## 2010-12-01 LAB — BASIC METABOLIC PANEL
BUN: 28 mg/dL — ABNORMAL HIGH (ref 6–23)
CO2: 27 mEq/L (ref 19–32)
Calcium: 9 mg/dL (ref 8.4–10.5)
GFR: 60.54 mL/min (ref >60.00)
Glucose, Bld: 128 mg/dL — ABNORMAL HIGH (ref 70–99)
Sodium: 138 mEq/L (ref 135–145)

## 2010-12-01 NOTE — Assessment & Plan Note (Signed)
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  This has progressed over time.  Theoretically, this could lead to a fall in cardiac output as atrial contraction early in the diastolic period could compromise complete LV filling.  I would not think that this could explain the extent of his symptoms, however.  I I am going to get a 48 hour holter to see if any more advanced AV block is present.

## 2010-12-01 NOTE — Assessment & Plan Note (Signed)
Per Dr. McAlhany.  No claudication.  

## 2010-12-01 NOTE — Assessment & Plan Note (Signed)
CAD s/p CABG.  See above for plans for left and right heart cath.  He will continue on ASA 81 mg daily and statin.  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 I am going to stop lisinopril today due to profound orthostasis.

## 2010-12-01 NOTE — Progress Notes (Signed)
PCP: Dr. [name]  70 yo with history of renal transplant, DM, HTN, and CAD s/p CABG returns for followup.  Patient had a long and complicated hospital admission in 1/11.  Patient was admitted to [facility] at that time with a UTI and pulmonary edema.  Troponin was noted to be elevated and the patient had a nuclear scan with a large fixed inferior defect.  He ended up having a cardiac catheterization showing severe disease involving the LM, LAD, ramus, CFX and RCA.  He had CABG x 5.  Post-op course was complicated by pulmonary edema requiring intubation, renal failure requiring CVVH, and atrial fibrillation.  He was in the hospital about 7 weeks.  In 11/11, patient had right SFA and popliteal atherectomy that was complicated by a left groin pseudoaneurysm.   For the last several months, patient had been more short of breath.  Lexiscan myoview in 5/12 showed a partially reversible inferior defect.  Echo 6/12 showed preserved EF of 60%, moderate LVH, normal RV, and very mild AS.  I increased his Lasix, with some weight loss.  He saw Dr. Delford Field and had right-sided thoracentesis with removal of 500 cc of fluid.  Cytology was negative for malignancy.  In-office FEV1 was quite low (35% predicted) suggesting significant COPD, so he was started on Spiriva. He was unable to tolerate Spiriva due to nausea and is now on Advair.  Given recurrence of orthostatic hypotension symptoms, I also increased his pyridostigmine.   At last appointment, Mr Hudnall was doing better with decreased orthostatic symptoms and improved breathing.  Over the last month, however, he has worsened again.  He is short of breath after walking 50 feet or going up a flight of steps.  He is not able to exercise.  Sometimes he gets short of breath just walking around his house . No chest pain (however, prior anginal equivalent was dyspnea).  Current symptoms are markedly changed compared to 6 months ago.  He continues to followup with pulmonary and was  noted to have desaturation with exertion.  He is on home oxygen with exertion and while sleeping.  He has mild atypical chest pain (no pain associated with the exertional dyspnea).   Worsening symptoms have left him increasingly depressed.   He also has developed increased lightheadedness with standing.  This initially improved with increasing pyridostigmine but has now worsened.  He was quite orthostatic in the office today, dropping from 141/76 lying to 91/55 standing with symptoms.  He has not passed out.    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 Diabetes mellitus 2. CKD s/p renal transplant 2002 3. HTN 4. PAD: Recent atherectomy right SFA and right popliteal on 01/12/10.  Complicated by left groin pseudoaneurysm.  5. Lumbar disc disease with low back pain 6. Diabetic gastroparesis 7. Prostate cancer s/p seed implantation in 1/06.  8. Erectile dysfunction 9. Tremor 10.  Obesity 11.  Diastolic CHF: Last echo (6/12) showed EF 60%, moderate LV hypertrophy, very mild AS, normal RV size and systolic function. 12.  CAD: Lexiscan myoview (2/11) with EF 44%,  fixed inferior defect.  Cath (2/11)  with 60% distal left main, 90% ostial LAD involving D1, 95% ostial large ramus, probable significant ostial CFX stenosis, 90% PDA, 70% PLV.  Paitent had CABG with LIMA-LAD, SVG-D, sequential SVG-ramus and OM2, SVG-PLV.  Lexiscan myoview (5/12) with EF 59%, partially reversible inferior defect.  13.  Atrial fibrillation post-op CABG 14.  Orthostatic hypotension:  improvement with pyridostigmine.  15.  Low testosterone 16.  Loculated right>left pleural  effusion: Right thoracentesis (6/12), cytology negative for malignancy.  17.  COPD: Suspected.  FEV1 35% (in-office test).   18.  Carotid dopplers (6/12): 0-39% bilateral ICA stenosis.  19.  Depression  Family History: Noncontributory  Social History: Married, retired Clinical research associate, lives in [place].  Prior smoker.  Review of Systems        All systems reviewed and negative except as per HPI.   Current Outpatient Prescriptions  Medication Sig Dispense Refill  . albuterol (PROVENTIL HFA;VENTOLIN HFA) 108 (90 BASE) MCG/ACT inhaler Inhale 2 puffs into the lungs every 6 (six) hours as needed for wheezing.  1 Inhaler  0  . aspirin EC 81 MG tablet Take 1 tablet (81 mg total) by mouth daily.  150 tablet  2  . atorvastatin (LIPITOR) 40 MG tablet Take 40 mg by mouth daily.        . carbidopa-levodopa (SINEMET CR) 25-100 MG per tablet Take 1 tablet by mouth 3 (three) times daily.        . Cholecalciferol (VITAMIN D) 1000 UNITS capsule Take 1,000 Units by mouth daily.        . Cholecalciferol (VITAMIN D3) 1000 UNITS CAPS Take 10,000 mg by mouth. Twice a week.      . DULoxetine (CYMBALTA) 60 MG capsule Take 60 mg by mouth daily.        Marland Kitchen ezetimibe (ZETIA) 10 MG tablet Take 10 mg by mouth daily.        . Ferrous Sulfate (IRON) 325 (65 FE) MG TABS Take by mouth daily.        . Fluticasone-Salmeterol (ADVAIR DISKUS) 250-50 MCG/DOSE AEPB Inhale 1 puff into the lungs every 12 (twelve) hours.        . furosemide (LASIX) 40 MG tablet Take 2 tablets in the morning and one tablet in the afternoon       . Insulin Aspart (NOVOLOG [place]) Inject into the skin. Via pump as directed        . LamoTRIgine (LAMICTAL ODT PO) Take by mouth as directed.        . magnesium oxide (MAG-OX) 400 MG tablet Take 400 mg by mouth 2 (two) times daily.       . mycophenolate (CELLCEPT) 500 MG tablet Take by mouth 2 (two) times daily.        . pantoprazole (PROTONIX) 40 MG tablet Take 40 mg by mouth 2 (two) times daily.       Marland Kitchen PLAVIX  75 MG tablet TAKE ONE (1) TABLET(S) ONCE DAILY  30 tablet  6  . predniSONE (DELTASONE) 5 MG tablet Take 5 mg by mouth daily.        Marland Kitchen pyridostigmine (MESTINON) 60 MG tablet Take 1 and 1/2 tablets twice a day.  90 tablet  6  . saxagliptin HCl (ONGLYZA) 5 MG TABS tablet Take 5 mg by mouth daily.        . tacrolimus (PROGRAF) 5 MG capsule Take 5 mg by mouth 2 (two) times daily.        . Tamsulosin HCl (FLOMAX) 0.4 MG CAPS Take  by mouth daily.        . temazepam (RESTORIL) 15 MG capsule Take 15 mg by mouth at bedtime as needed.        Marland Kitchen DISCONTD: furosemide (LASIX) 40 MG tablet Take 1 and 1/2 tablets twice a day  90 tablet  6    BP 91/55  Pulse 86  Ht 6\' 2"  (1.88 m)  Wt 212 lb (96.163 kg)  BMI 27.22 kg/m2  SpO2 96% General: NAD, overweight Neck: No JVD, no thyromegaly or thyroid nodule.  Lungs: Distant breath sounds bilaterally. Decreased breath sounds at right base persist.  CV: Nondisplaced PMI.  Heart regular S1/S2, no S3/S4, 2/6 SEM RUSB.  No ankle edema.  Normal pedal pulses.  Abdomen: Soft, nontender, no hepatosplenomegaly, no distention.  Neurologic: Alert and oriented x 3.  Psych: Depressed affect Extremities: No clubbing or cyanosis.

## 2010-12-01 NOTE — Assessment & Plan Note (Signed)
Last creatinine 1.1, history of renal transplantation.  As above, plan left heart cath.  Will try to minimize contrast and will not do LV-gram.

## 2010-12-01 NOTE — Assessment & Plan Note (Signed)
Symptoms initially improved with increased pyridostigmine.  However, he is profoundly orthostatic today.  I am going to stop lisinoprill (though he is only on a low dose).  He is unable to tolerate compression stockings.  I would like to avoid midodrine and fludrocortisone due to history of CAD, HTN and CHF.

## 2010-12-01 NOTE — Assessment & Plan Note (Signed)
Check lipid/LFTs with goal LDL < 70.

## 2010-12-01 NOTE — Patient Instructions (Signed)
Stop lisinopril.  Lab today--lipid profile/BMP/BNP/CBC/PT 414.01 428.2  Schedule an appointment for a 48 hour heart monitor.  Your physician has requested that you have a cardiac catheterization. Cardiac catheterization is used to diagnose and/or treat various heart conditions. Doctors may recommend this procedure for a number of different reasons. The most common reason is to evaluate chest pain. Chest pain can be a symptom of coronary artery disease (CAD), and cardiac catheterization can show whether plaque is narrowing or blocking your heart's arteries. This procedure is also used to evaluate the valves, as well as measure the blood flow and oxygen levels in different parts of your heart. For further information please visit [email or web address] Please follow instruction sheet, as given. Wednesday October 17,2012.   Schedule an appointment with Dr Shirlee Latch for about 2 weeks after the catheterization.

## 2010-12-01 NOTE — Assessment & Plan Note (Signed)
Dyspnea on exertion continues to be a very distressing problem and seems to have worsened again.  He does not appear volume overloaded on exam, so I am not going to increase his Lasix. Diastolic CHF, COPD, and deconditioning all may be playing a role in his dyspnea.  However, I remain concerned about coronary disease.  Myoview in 5/12 showed inferior ischemia (a prior myoview showed inferior infarction).  I think that at this point we need to rule out ischemia as a contributor to his symptoms.  I had held off of cath initially due to history of renal transplant and ongoing pulmonary workup.  However, he has worsened since I last saw him.  His prior ischemic symptoms were mainly related to dyspnea and CHF without prominent chest pain.  - Plan right and left heart cath.  I will let his nephrologist know of this.  - Continue current Lasix dosing as he does not appear volume overloaded.  I will get a BNP.  - He would likely benefit from pulmonary rehab - Continue oxygen at night and with exertion.

## 2010-12-02 LAB — BRAIN NATRIURETIC PEPTIDE: Pro B Natriuretic peptide (BNP): 197 pg/mL — ABNORMAL HIGH (ref 0.0–100.0)

## 2010-12-07 ENCOUNTER — Inpatient Hospital Stay (HOSPITAL_BASED_OUTPATIENT_CLINIC_OR_DEPARTMENT_OTHER)
Admission: RE | Admit: 2010-12-07 | Discharge: 2010-12-07 | Disposition: A | Discharge status: Home / Self Care | Payer: Medicare Other | Source: Ambulatory Visit | Attending: Cardiology | Admitting: Cardiology

## 2010-12-07 DIAGNOSIS — N189 Chronic kidney disease, unspecified: Secondary | ICD-10-CM | POA: Insufficient documentation

## 2010-12-07 DIAGNOSIS — R9439 Abnormal result of other cardiovascular function study: Secondary | ICD-10-CM | POA: Insufficient documentation

## 2010-12-07 DIAGNOSIS — R0609 Other forms of dyspnea: Secondary | ICD-10-CM | POA: Insufficient documentation

## 2010-12-07 DIAGNOSIS — R0989 Other specified symptoms and signs involving the circulatory and respiratory systems: Secondary | ICD-10-CM | POA: Insufficient documentation

## 2010-12-07 DIAGNOSIS — Z951 Presence of aortocoronary bypass graft: Secondary | ICD-10-CM | POA: Insufficient documentation

## 2010-12-07 DIAGNOSIS — R0602 Shortness of breath: Secondary | ICD-10-CM | POA: Insufficient documentation

## 2010-12-07 DIAGNOSIS — Z940 Kidney transplant status: Secondary | ICD-10-CM | POA: Insufficient documentation

## 2010-12-07 DIAGNOSIS — I2510 Atherosclerotic heart disease of native coronary artery without angina pectoris: Secondary | ICD-10-CM

## 2010-12-07 LAB — POCT I-STAT 3, VENOUS BLOOD GAS (G3P V)
pCO2, Ven: 46.2 mmHg (ref 45.0–50.0)
pO2, Ven: 33 mmHg (ref 30.0–45.0)

## 2010-12-07 LAB — POCT I-STAT GLUCOSE
Glucose, Bld: 87 mg/dL (ref 70–99)
Glucose, Bld: 111 mg/dL — ABNORMAL HIGH (ref 70–99)
Operator id: 141321
Operator id: 194801

## 2010-12-07 LAB — POCT I-STAT 3, ART BLOOD GAS (G3+)
Acid-Base Excess: 3 mmol/L — ABNORMAL HIGH (ref 0.0–2.0)
pH, Arterial: 7.429 (ref 7.350–7.450)

## 2010-12-12 ENCOUNTER — Telehealth: Payer: Self-pay | Admitting: Cardiology

## 2010-12-12 NOTE — Telephone Encounter (Signed)
All Cardiac faxed to [facility] @ [phone]   12/12/10/km

## 2010-12-14 ENCOUNTER — Other Ambulatory Visit: Payer: Medicare Other | Admitting: *Deleted

## 2010-12-14 NOTE — Cardiovascular Report (Signed)
NAME:  John [name], John [name] NO.:  [id number]  MEDICAL RECORD NO.:  [id number]  LOCATION:                                 FACILITY:  PHYSICIAN:  [name], MD      DATE OF BIRTH:  [birth year]  DATE OF PROCEDURE:  12/07/2010 DATE OF DISCHARGE:                           CARDIAC CATHETERIZATION   PROCEDURE: 1. Left heart catheterization. 2. Right heart catheterization. 3. Coronary angiography. 4. Saphenous vein graft and angiography. 5. Left internal mammary artery angiography.  SURGEON:  [name], MD  INDICATION:  This is a 70 year old who presents with dyspnea.  He has a history of having bypass surgery.  He had an abnormal Myoview with inferior ischemia.  Despite medical treatment, he continues to be short of breath.  Therefore, we brought him in today for right and left heart catheterization.  Of note, the patient has a history of a renal transplant.  His creatinine has been 1.2-1.3.  We will try to minimize contrast as much as possible.  PROCEDURE NOTE:  After informed consent was obtained, the right groin was sterilely prepped and draped.  Lidocaine 1% was used to locally anesthetize the right groin area.  The right common femoral vein was entered using modified Seldinger technique and a 7-French venous sheath was placed.  The right common femoral artery was accessed using modified Seldinger technique and a 5-French arterial sheath was placed.  The common femoral artery was heavily calcified and was somewhat difficult to access.  The right heart catheterization was carried out using balloon tip Swan-Ganz catheter.  The saphenous vein grafts and the right and left coronary arteries were engaged using the multipurpose catheter. Left ventricle was entered using multipurpose catheter.  Left ventriculography was not done due to chronic kidney disease.  The LIMA was engaged using LIMA catheter.  There were no complications.  FINDINGS: 1. Hemodynamics:   Fick cardiac output 5.5 L/min and cardiac index 2.8.     Thermodilution cardiac output 5.2 L/min and cardiac index 2.7.     Aortic saturation 91%.  PA saturation 62%.  Mean right atrial     pressure 6 mmHg.  RV 37/4.  PA 38/12 with mean PA pressure 24 mmHg.     Mean pulmonary capillary wedge pressure 9 mmHg.  Aorta 144/94.  LV     149/19. 2. Left ventriculography:  Left ventriculography was not done due to     chronic kidney disease. 3. Right coronary artery:  Right coronary artery was a dominant     vessel.  The right coronary artery itself had luminal     irregularities.  It gave rise to small-to-moderate PLV and a small-     to-moderate PDA.  The PDA had a 95% midvessel stenosis.  This is     unchanged from his prior catheterization in February 2011.  There     was a saphenous vein graft to the PLV.  This vessel had about 30%     proximal stenosis.  The PLV itself had about a 50% proximal     stenosis prior to the touchdown of the vein graft. 4. Left main:  There was 50-60% distal left main stenosis.  This is     heavily calcified and extended into the ostial LAD and the ostial     circumflex. 5. Left circumflex system:  There was a moderate-sized ramus with a     99% ostial stenosis.  The circumflex itself had probably about a     60% ostial stenosis.  The ostial stenoses were heavily calcified.     There was a 50% mid circumflex stenosis, this is followed by a     small first obtuse marginal and a small-to-moderate second obtuse     marginal.  This obtuse marginal had about 40% proximal stenosis.     The sequential saphenous vein graft touched down on the ramus and on the second obtuse marginal.  This vessel was patent.  There was     about a 40% stenosis in the branch of the graft that extended to     the second obtuse marginal 6. LAD system:  There was a 90% heavily calcified lesion in the proximal LAD.     This extended through the ostium of the first diagonal.  There was a      patent saphenous vein graft to the first diagonal.  However, there     was an about 60% vein graft stenosis at the touchdown of the first     diagonal.  There was a patent LIMA to the LAD.  In the distal LAD well     beyond the touchdown the LIMA, there was about a 60-70% distal LAD     stenosis.  The vessel is quite small at this point.  IMPRESSION:  The patient's grafts are patent.  The most likely source of ischemia based on his circulation is a 95% mid posterior descending artery stenosis.  This same lesion was present on the prior catheterization in February 2011 and was not grafted at time of coronary artery bypass grafting.  The posterior descending artery is relatively a small vessel.  There is actually only a small territory supplied beyond this stenosis.  The patient's symptoms are exertional shortness of breath.  At this time, I will plan on treating him medically.  I will start him on Imdur 30 mg a day.  If he has no improvement, I would consider trying ranolazine.  His left and right heart filling pressures are near normal.  I do not think he needs to increase his Lasix dose.  I am actually going to have him hold his Lasix until tomorrow evening given the contrast load.  Of note, we did use about 80 mL of contrast to identify all of his bypass grafts.  He is going to be hydrated this afternoon post catheterization and we will hold his Lasix until tomorrow evening.  We will have him follow up with me in 2 weeks with a basic metabolic panel.     [name], MD     DM/MEDQ  D:  12/07/2010  T:  12/07/2010  Job:  [id number]  cc:   Charlcie Cradle. Delford Field, MD, FCCP Richard F. [name], M.D. Larina Earthly, M.D.  Electronically Signed by [name] MD on 12/14/2010 08:23:26 PM

## 2010-12-15 ENCOUNTER — Encounter: Payer: Self-pay | Admitting: Cardiology

## 2010-12-26 ENCOUNTER — Ambulatory Visit (INDEPENDENT_AMBULATORY_CARE_PROVIDER_SITE_OTHER): Payer: Medicare Other | Admitting: Cardiology

## 2010-12-26 ENCOUNTER — Encounter (INDEPENDENT_AMBULATORY_CARE_PROVIDER_SITE_OTHER): Payer: Medicare Other

## 2010-12-26 ENCOUNTER — Encounter: Payer: Self-pay | Admitting: Cardiology

## 2010-12-26 VITALS — BP 144/70 | HR 80 | Ht 74.0 in | Wt 217.0 lb

## 2010-12-26 DIAGNOSIS — J449 Chronic obstructive pulmonary disease, unspecified: Secondary | ICD-10-CM

## 2010-12-26 DIAGNOSIS — N189 Chronic kidney disease, unspecified: Secondary | ICD-10-CM

## 2010-12-26 DIAGNOSIS — I951 Orthostatic hypotension: Secondary | ICD-10-CM

## 2010-12-26 DIAGNOSIS — I5032 Chronic diastolic (congestive) heart failure: Secondary | ICD-10-CM

## 2010-12-26 DIAGNOSIS — I7389 Other specified peripheral vascular diseases: Secondary | ICD-10-CM

## 2010-12-26 DIAGNOSIS — E785 Hyperlipidemia, unspecified: Secondary | ICD-10-CM

## 2010-12-26 DIAGNOSIS — I440 Atrioventricular block, first degree: Secondary | ICD-10-CM

## 2010-12-26 DIAGNOSIS — I509 Heart failure, unspecified: Secondary | ICD-10-CM

## 2010-12-26 DIAGNOSIS — I459 Conduction disorder, unspecified: Secondary | ICD-10-CM

## 2010-12-26 DIAGNOSIS — I4430 Unspecified atrioventricular block: Secondary | ICD-10-CM

## 2010-12-26 DIAGNOSIS — I25810 Atherosclerosis of coronary artery bypass graft(s) without angina pectoris: Secondary | ICD-10-CM

## 2010-12-26 NOTE — Patient Instructions (Signed)
Your physician has recommended that you wear a holter monitor. Holter monitors are medical devices that record the heart's electrical activity. Doctors most often use these monitors to diagnose arrhythmias. Arrhythmias are problems with the speed or rhythm of the heartbeat. The monitor is a small, portable device. You can wear one while you do your normal daily activities. This is usually used to diagnose what is causing palpitations/syncope (passing out).  48 holter monitor  Your physician recommends that you schedule a follow-up appointment in: 3 months with Dr Shirlee Latch.

## 2010-12-26 NOTE — Assessment & Plan Note (Signed)
Well-compensated on current dose of Lasix on recent right heart cath.

## 2010-12-26 NOTE — Assessment & Plan Note (Signed)
Creatinine stable after cath.

## 2010-12-26 NOTE — Assessment & Plan Note (Addendum)
Better off lisinopril.  Still with mild symptoms.  Will allow BP to run a touch high to avoid significant orthostasis. Continue pyridostigmine.  He would not be a good candidate for midodrine or fludrocortisone given supine HTN.

## 2010-12-26 NOTE — Assessment & Plan Note (Signed)
Per Dr. Clifton James.  No claudication.

## 2010-12-26 NOTE — Assessment & Plan Note (Signed)
LDL at goal in 10/12 (< 70).  

## 2010-12-26 NOTE — Assessment & Plan Note (Signed)
This plays a role in his exertional dyspnea.  He has home oxygen.

## 2010-12-26 NOTE — Assessment & Plan Note (Signed)
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  This has progressed over time.  Theoretically, this could lead to a fall in cardiac output as atrial contraction early in the diastolic period could compromise complete LV filling.  I would not think that this could explain the extent of his symptoms, however.  I I am going to get a 48 hour holter to see if any more advanced AV block is present.  

## 2010-12-26 NOTE — Progress Notes (Signed)
PCP: Dr. [name]  70 yo with history of renal transplant, DM, HTN, and CAD s/p CABG returns for followup.  Patient had a long and complicated hospital admission in 1/11.  Patient was admitted to [facility] at that time with a UTI and pulmonary edema.  Troponin was noted to be elevated and the patient had a nuclear scan with a large fixed inferior defect.  He ended up having a cardiac catheterization showing severe disease involving the LM, LAD, ramus, CFX and RCA.  He had CABG x 5.  Post-op course was complicated by pulmonary edema requiring intubation, renal failure requiring CVVH, and atrial fibrillation.  He was in the hospital about 7 weeks.  In 11/11, patient had right SFA and popliteal atherectomy that was complicated by a left groin pseudoaneurysm.   For the last several months, patient had been more short of breath.  Lexiscan myoview in 5/12 showed a partially reversible inferior defect.  Echo 6/12 showed preserved EF of 60%, moderate LVH, normal RV, and very mild AS.  I increased his Lasix, with some weight loss.  He saw Dr. Delford Field and had right-sided thoracentesis with removal of 500 cc of fluid.  Cytology was negative for malignancy.  In-office FEV1 was quite low (35% predicted) suggesting significant COPD, so he was started on Spiriva. He was unable to tolerate Spiriva due to nausea and is now on Advair.  Given recurrence of orthostatic hypotension symptoms, I also increased his pyridostigmine.   At last appointment, Mr Bonebrake reported worsening orthostatic-type lightheadedness and worsening exertional dyspnea.  I decided at this point to take him for right and left heart cath.  On Lasix, his right and left heart filling pressures were no significantly elevated.  His bypass grafts were patent but he had an ungrafted PDA with 95% stenosis (small vessel that would not be amenable to PCI).  As this was a possible source of ischemia and could potentially cause exertional dyspnea (his anginal equivalent  was dyspnea in the past), I started him on Imdur 30 mg daily.  He says that this helped his breathing.  He is feeling better from that standpoint.  I also took him off lisinopril due to worsening orthostatic symptoms.  He says that he still occasionally gets lightheaded with standing, but this is also better.  Mood is better today also.    Unfortunately, despite these improvements, he ruptured the L4-L5 disc recently and has had severe back pain radiating down his left leg with some weakness.  He is not walking much due to the pain.  He is seeing an orthopedic surgeon and may be getting a steroid injection.   Labs (9/11): LDL 71, HDL 45 Labs (12/11): K 4.9, creatinine 0.9, HCT 37.1  Labs (5/12): K 4.9, creatinine 0.9, BNP 157, HCT 36.4 Labs (5/12): LDL 42, HDL 44, K 3.7, creatinine 1.1, BNP 105 Labs (6/12): K 5, creatinine 1.1, TSH normal Labs (10/12): LDL 53, HDL 47, K 4.3, creatinine 1.61, BNP 197  Allergies (verified):  No Known Drug Allergies  Past Medical History: 1. Diabetes mellitus 2. CKD s/p renal transplant 2002 3. HTN 4. PAD: Recent atherectomy right SFA and right popliteal on 01/12/10.  Complicated by left groin pseudoaneurysm.  5. Lumbar disc disease with low back pain 6. Diabetic gastroparesis 7. Prostate cancer s/p seed implantation in 1/06.  8. Erectile dysfunction 9. Tremor 10.  Obesity 11.  Diastolic CHF: Last echo (6/12) showed EF 60%, moderate LV hypertrophy, very mild AS, normal RV size and  systolic function. 12.  CAD: Lexiscan myoview (2/11) with EF 44%,  fixed inferior defect.  Cath (2/11) with 60% distal left main, 90% ostial LAD involving D1, 95% ostial large ramus, probable significant ostial CFX stenosis, 90% PDA, 70% PLV.  Paitent had CABG with LIMA-LAD, SVG-D, sequential SVG-ramus and OM2, SVG-PLV.  Lexiscan myoview (5/12) with EF 59%, partially reversible inferior defect.  LHC/RHC (10/12): RA 6, PA 38/12, PCWP 9, CI 2.8; SVG-OM and ramus patent, LIMA-LAD  patent, SVG-D1 patent with 60% stenosis at touchdown, SVG-PLV patent; there was a 95% PDA stenosis (seen on study prior to CABG) that was ungrafted.  The PDA was a small caliber vessel and not well-suited to PCI.  This could be a source of exertional symptoms.   13.  Atrial fibrillation post-op CABG 14.  Orthostatic hypotension:  improvement with pyridostigmine.  15.  Low testosterone 16.  Loculated right>left pleural effusion: Right thoracentesis (6/12), cytology negative for malignancy.  17.  COPD: Suspected.  FEV1 35% (in-office test).   18.  Carotid dopplers (6/12): 0-39% bilateral ICA stenosis.  19.  Depression 20.  Lumbar disc disease  Family History: Noncontributory  Social History: Married, retired Clinical research associate, lives in [place].  Prior smoker.  Review of Systems        All systems reviewed and negative except as per HPI.   Current Outpatient Prescriptions  Medication Sig Dispense Refill  . albuterol (PROVENTIL HFA;VENTOLIN HFA) 108 (90 BASE) MCG/ACT inhaler Inhale 2 puffs into the lungs every 6 (six) hours as needed for wheezing.  1 Inhaler  0  . aspirin EC 81 MG tablet Take 1 tablet (81 mg total) by mouth daily.  150 tablet  2  . atorvastatin (LIPITOR) 40 MG tablet Take 40 mg by mouth daily.        . carbidopa-levodopa (SINEMET CR) 25-100 MG per tablet Take 1 tablet by mouth 3 (three) times daily.        . Cholecalciferol (VITAMIN D) 1000 UNITS capsule Take 1,000 Units by mouth daily.        . Cholecalciferol (VITAMIN D3) 1000 UNITS CAPS Take 10,000 mg by mouth. Twice a week.      . clobetasol (OLUX) 0.05 % topical foam as directed.      . DULoxetine (CYMBALTA) 60 MG capsule Take 60 mg by mouth daily.        Marland Kitchen ezetimibe (ZETIA) 10 MG tablet Take 10 mg by mouth daily.        . Ferrous Sulfate (IRON) 325 (65 FE) MG TABS Take by mouth daily.        . Fluticasone-Salmeterol (ADVAIR DISKUS) 250-50 MCG/DOSE AEPB Inhale 1 puff into the lungs every 12 (twelve) hours.        .  furosemide (LASIX) 40 MG tablet Take 2 tablets in the morning and one tablet in the afternoon       . Insulin Aspart (NOVOLOG Stokes) Inject into the skin. Via pump as directed        . isosorbide mononitrate (IMDUR) 30 MG 24 hr tablet Take 1 tablet by mouth Daily.      . LamoTRIgine (LAMICTAL ODT PO) Take by mouth as directed.        . magnesium oxide (MAG-OX) 400 MG tablet Take 400 mg by mouth 2 (two) times daily.       . mycophenolate (CELLCEPT) 500 MG tablet Take by mouth 2 (two) times daily.        Marland Kitchen oxyCODONE (OXY IR/ROXICODONE) 5 MG immediate  release tablet Take 1 tablet by mouth Twice daily.      . pantoprazole (PROTONIX) 40 MG tablet Take 40 mg by mouth 2 (two) times daily.       Marland Kitchen PLAVIX 75 MG tablet TAKE ONE (1) TABLET(S) ONCE DAILY  30 tablet  6  . predniSONE (DELTASONE) 5 MG tablet Take 5 mg by mouth daily.        Marland Kitchen pyridostigmine (MESTINON) 60 MG tablet Take 1 and 1/2 tablets twice a day.  90 tablet  6  . saxagliptin HCl (ONGLYZA) 5 MG TABS tablet Take 5 mg by mouth daily.        . tacrolimus (PROGRAF) 5 MG capsule Take 5 mg by mouth 2 (two) times daily.        . Tamsulosin HCl (FLOMAX) 0.4 MG CAPS Take by mouth daily.        . temazepam (RESTORIL) 15 MG capsule Take 15 mg by mouth at bedtime as needed.          BP 144/70  Pulse 80  Ht 6\' 2"  (1.88 m)  Wt 217 lb (98.431 kg)  BMI 27.86 kg/m2 General: NAD, overweight Neck: No JVD, no thyromegaly or thyroid nodule.  Lungs: Distant breath sounds bilaterally. Decreased breath sounds at right base persist.  CV: Nondisplaced PMI.  Heart regular S1/S2, no S3/S4, 2/6 early SEM RUSB.  No ankle edema.  Normal pedal pulses.  Right carotid bruit.  Abdomen: Soft, nontender, no hepatosplenomegaly, no distention.  Neurologic: Alert and oriented x 3.  Psych: Depressed affect Extremities: No clubbing or cyanosis.

## 2010-12-26 NOTE — Assessment & Plan Note (Signed)
Ungrafted PDA could be a source for ischemia.  This a small caliber vessel and not amenable to intervention.  His prior anginal equivalent has been dyspnea.  I think that some portion of his exertional dypsnea may be explainable by ischemia.  He is actually doing better on Imdur 30 mg daily.  Will continue this medication.  He will also continue ASA 81, statin, Plavix.  No beta blocker or ACEI due to orthostatic hypotension.

## 2011-01-05 ENCOUNTER — Telehealth: Payer: Self-pay | Admitting: *Deleted

## 2011-01-05 NOTE — Telephone Encounter (Signed)
01/05/11--Dr Shirlee Latch reviewed monitor results done 12/26/10-12/28/10 -no changes in therapy. Pt notified.

## 2011-01-23 ENCOUNTER — Telehealth: Payer: Self-pay | Admitting: Critical Care Medicine

## 2011-01-23 NOTE — Telephone Encounter (Signed)
Pt returned Leslie's call & asked to be reached at [phone].  [name]

## 2011-01-23 NOTE — Telephone Encounter (Signed)
LMTCB

## 2011-01-23 NOTE — Telephone Encounter (Signed)
Called and spoke with pt and sched appt with PW for tomorrow at 3 pm. Advised to seek emergency care in the meantime if needed. Pt vebalized understanding.

## 2011-01-24 ENCOUNTER — Encounter: Payer: Self-pay | Admitting: Critical Care Medicine

## 2011-01-24 ENCOUNTER — Ambulatory Visit (INDEPENDENT_AMBULATORY_CARE_PROVIDER_SITE_OTHER)
Admission: RE | Admit: 2011-01-24 | Discharge: 2011-01-24 | Disposition: A | Discharge status: Home / Self Care | Payer: Medicare Other | Source: Ambulatory Visit | Attending: Critical Care Medicine | Admitting: Critical Care Medicine

## 2011-01-24 ENCOUNTER — Ambulatory Visit (INDEPENDENT_AMBULATORY_CARE_PROVIDER_SITE_OTHER): Payer: Medicare Other | Admitting: Critical Care Medicine

## 2011-01-24 VITALS — BP 150/78 | HR 68 | Temp 98.0°F | Ht 74.0 in | Wt 220.0 lb

## 2011-01-24 DIAGNOSIS — J90 Pleural effusion, not elsewhere classified: Secondary | ICD-10-CM

## 2011-01-24 DIAGNOSIS — I509 Heart failure, unspecified: Secondary | ICD-10-CM

## 2011-01-24 MED ORDER — FUROSEMIDE 40 MG PO TABS: 60.0000 mg | ORAL_TABLET | Freq: Two times a day (BID) | ORAL | Status: DC

## 2011-01-24 MED ORDER — METOLAZONE 2.5 MG PO TABS: ORAL_TABLET | ORAL | Status: AC

## 2011-01-24 NOTE — Progress Notes (Signed)
Subjective:    Patient ID: John [name], male    DOB: Aug 23, 1940, 70 y.o.   MRN: [id number]  HPI  70 yo with history of renal transplant on chronic immunosuppressants, DM-insulin dependent, HTN, , Chronic back pain and CAD s/p CABG . Patient had a long and complicated hospital admission in 1/11 s/p CABG x 5.   Post-op course was complicated by pulmonary edema requiring intubation, renal failure requiring CVVH, and atrial fibrillation. He was in the hospital about 7 weeks. In 11/11, patient had right SFA and popliteal atherectomy that was complicated by a groin pseudoaneurysm. Hx of orthostatic hypotension, ShyDrager, and severe tremor followed by [facility]    07/14/10 -  presents with pleural effusion. The patient has no history of history of diagnosed COPD, shortness of breath, chest tightness, chest pain worse with breathing and coughing, wheezing, cough, mucous production, nocturnal awakening, exercise induced symptoms, and congestion. The patient comes in today for initial evaluation of pleural effusion. Evaluation to date has included CXR. The chest X-ray reveals a right pleural effusion.  This pt has a loculated R pleural effusion on CXR. This pt had a complicated course after CABG 2/11. Last CXR showed effusion R lung 3/11. No films since. PT is s/p renal TPL 2002. There are no new symptoms, no cough, no chest pain. No pulm complaints at all PT has fatigue in rehab only.  Pt seen today 07/14/10 as a workin for ongoing dyspnea. In 11/11 we elected to watch the effusion on R.  See symptoms as above for dyspnea and cough  >>right thoracentesis and Avelox.   07/26/10 Follow up  Pt returns for 2 week follow up. Seen last ov with persistent DOE, dry cough and weakness. Set up for a right thoracentesis w/ 0.5 liters pulled off.  Path was neg for malignant cells. NO growth on cx.  Post thoracetesis film with decreased effusion size. Was given Avelox for 5 days . Pt says he could take deeper breath  but has seen no real change in his dyspnea level.   Main complaint is he is so fatigued that he wears out with minimal activity that is associated with dyspnea. He gets fatigued and winded just getting ready for bed. Most trouble is with inclines and if he has to exert himself.   Labs last ov were essentially unremarbable with improved BNP ~150. Hbg at 11.4. TSH nml.   He is on numerous meds incluiding ACE inhbitor. Says his dry cough is minimal and does not consider bothersome. He denies wheeze.  He was given Asmanex last ov but did not feel it helped.   Today we did in office spirometry. Pt had great difficulty completing this test. Poor quality -FEV1 1.35  l/m (35%) He smoked on/off for >20 years >1PPD beginning at age 89.   He does snore some and feels tired in am. Significant fatigue ? Daytime hypersolomenence.   08/18/10 At last ov   The Np recommended Suspect this is multifactoral in nature with underlying diastolic dysfunction. Fluid volume appears improved with  Increased diuretics. However he is very complicated with hx of orhtostatic hypotension (on ACE inhibitor and diurectics)  He complains severe fatigue which could be side effect of polypharmacy -he is on chronic pain med, b/p med, diuretics, parkinson meds which could all be contributing.  His spirometry was poor quality due to inability to give a good exhalation however it does show decreased lung fnx. Maybe once he is  A little stronger will set  up for a full PFT - FEV1 today shows 1.35l/m (35%). He has a hx of heavy smoking-will begin Spiriva today -2 sample given.  Set up for an overnight oximetry test. He does desat minimally at 89% which does not qualify him for O2.  Will await ONO results. Also may want to consider sleep test in future - he does have some risk factors for this.  Have him come back in 2 weeks for close follow up .   Constipation with spiriva now.  Stomach is upset and burping all the time.  Notes pain in  pit of the stomach.  Notes constant spiriva. Could breathe deeper.  Cant get pills down, food lodges and has to throw up.   No chest pain  Dr [name] wants copy of records  7/26 Pills lodge in esophagus and will cough.  Saw PCP.  C/o dysphagia.  Had Bar swallow   Past Medical History:  1. Diabetes mellitus  2. CKD s/p renal transplant 2002 Dr [name]  3. HTN  4. PAD: Recent atherectomy right SFA and right popliteal on 01/12/10. Complicated by pseudoaneurysm.  5. Lumbar disc disease with low back pain  6. Diabetic gastroparesis  7. Prostate cancer s/p seed implantation in 1/06.  8. Erectile dysfunction  9. Tremor  10. Obesity  11. Diastolic CHF: Last echo (4/11) with inferior and septal hypokinesis, moderate LV hypertrophy, EF 45-50%, mild left atrial enlargement.  12. CAD: Lexiscan myoview (2/11) with EF 44%, fixed inferior defect. Cath (2/11) with 60% distal left main, 90% ostial LAD involving D1, 95% ostial large ramus, probable significant ostial CFX stenosis, 90% PDA, 70% PLV. Paitent had CABG with LIMA-LAD, SVG-D, sequential SVG-ramus and OM2, SVG-PLV. Lexsican myoview (5/12) with EF 59%, partially reversible inferior defect.  13. Atrial fibrillation post-op CABG  14. Orthostatic hypotension: Some improvement with pyridostigmine.  15. Low testosterone  16. Loculated right>left pleural effusion   12/4 Pt notes for one month, more dyspnea. Prior to this was ok.  Seemed like sudden in onset.   Had been on QHS oxygen 2L and prn daytime.  81% on arrival on RA. Now notes no cough.  No real chest pain.  No real wheeze.  Sl edema in feet.  Still with dysphagia,  Sees GI and they did not feel needs dilation  (esoph,  Schooler) No QHS cough . Past Medical History  Diagnosis Date  . CAD (coronary artery disease)     a. myoview 1/11: lg inf fixed defect;   b. cath 1/11: 3v CAD,  c. s/p CABG 1/11 c/b acute renal failure and AFib  . Atrial fibrillation   . Chronic diastolic heart failure     a.  echo 4/11: EF 45-50%, mod LVH, LAE, inf and septal HK  . First degree AV block   . Orthostatic hypotension     pyridostigmine  . History of renal transplant   . DM2 (diabetes mellitus, type 2)   . Diabetic gastroparesis   . HTN (hypertension)   . HLD (hyperlipidemia)   . PAD (peripheral artery disease)     s/p R SFA-Pop atherectomy 11/11  . Prostate cancer   . ED (erectile dysfunction)   . DDD (degenerative disc disease), lumbar   . Tremor   . Obesity   . Low testosterone   . Chronic kidney disease     s/p renal transplant 2002  . Pleural effusion, right     loculated  . COPD (chronic obstructive pulmonary disease)  Family History  Problem Relation Age of Onset  . Coronary artery disease Mother   . Valvular heart disease Father   . Hepatitis      sibling  . Coronary artery disease Sister   . Heart attack Sister   . Tuberculosis Father      History   Social History  . Marital Status: Married    Spouse Name: N/A    Number of Children: 3  . Years of Education: N/A   Occupational History  . retired Clinical research associate    Social History Main Topics  . Smoking status: Former Smoker -- 1.5 packs/day for 20 years    Types: Cigarettes    Quit date: 02/20/1978  . Smokeless tobacco: Never Used   Comment: smoked 1 ppd, stopped 1980  . Alcohol Use: No  . Drug Use: No  . Sexually Active: Not on file   Other Topics Concern  . Not on file   Social History Narrative  . No narrative on file     No Known Allergies   Outpatient Prescriptions Prior to Visit  Medication Sig Dispense Refill  . aspirin EC 81 MG tablet Take 1 tablet (81 mg total) by mouth daily.  150 tablet  2  . atorvastatin (LIPITOR) 40 MG tablet Take 40 mg by mouth daily.        . carbidopa-levodopa (SINEMET CR) 25-100 MG per tablet Take 1 tablet by mouth 3 (three) times daily.        . Cholecalciferol (VITAMIN D) 1000 UNITS capsule Take 1,000 Units by mouth daily.        . Cholecalciferol (VITAMIN D3) 1000  UNITS CAPS Take 10,000 mg by mouth. Twice a week.      . clobetasol (OLUX) 0.05 % topical foam as directed.      . ezetimibe (ZETIA) 10 MG tablet Take 10 mg by mouth daily.        . Ferrous Sulfate (IRON) 325 (65 FE) MG TABS Take by mouth daily.        . Fluticasone-Salmeterol (ADVAIR DISKUS) 250-50 MCG/DOSE AEPB Inhale 1 puff into the lungs every 12 (twelve) hours.        . Insulin Aspart (NOVOLOG [place]) Inject into the skin. Via pump as directed        . isosorbide mononitrate (IMDUR) 30 MG 24 hr tablet Take 1 tablet by mouth Daily.      . LamoTRIgine (LAMICTAL ODT PO) Take by mouth as directed.        . magnesium oxide (MAG-OX) 400 MG tablet Take 400 mg by mouth 2 (two) times daily.       . mycophenolate (CELLCEPT) 500 MG tablet Take by mouth 2 (two) times daily.        Marland Kitchen oxyCODONE (OXY IR/ROXICODONE) 5 MG immediate release tablet Take 1 tablet by mouth Twice daily.      . pantoprazole (PROTONIX) 40 MG tablet Take 40 mg by mouth 2 (two) times daily.       Marland Kitchen PLAVIX 75 MG tablet TAKE ONE (1) TABLET(S) ONCE DAILY  30 tablet  6  . predniSONE (DELTASONE) 5 MG tablet Take 5 mg by mouth daily.        . saxagliptin HCl (ONGLYZA) 5 MG TABS tablet Take 5 mg by mouth daily.        . tacrolimus (PROGRAF) 5 MG capsule Take 5 mg by mouth 2 (two) times daily.        . Tamsulosin HCl (FLOMAX) 0.4 MG  CAPS Take by mouth daily.        . temazepam (RESTORIL) 15 MG capsule Take 15 mg by mouth at bedtime as needed.        . furosemide (LASIX) 40 MG tablet Take 2 tablets in the morning and one tablet in the afternoon       . pyridostigmine (MESTINON) 60 MG tablet Take 1 and 1/2 tablets twice a day.  90 tablet  6  . albuterol (PROVENTIL HFA;VENTOLIN HFA) 108 (90 BASE) MCG/ACT inhaler Inhale 2 puffs into the lungs every 6 (six) hours as needed for wheezing.  1 Inhaler  0  . DULoxetine (CYMBALTA) 60 MG capsule Take 60 mg by mouth daily.           Review of Systems  Constitutional:   No  weight loss, night sweats,   Fevers, chills,  fatigue, or  lassitude.  HEENT:   No headaches,  Difficulty swallowing,  Tooth/dental problems, or  Sore throat,                No sneezing, itching, ear ache, nasal congestion, post nasal drip,   CV:  No chest pain,   +Orthopnea, PND, swelling in lower extremities,  , dizziness,     GI  No heartburn, indigestion, abdominal pain, nausea, vomiting, diarrhea, change in bowel habits, loss of appetite, bloody stools.   Resp: +shortness of breath with exertion or at rest.  No excess mucus, no productive cough,  + non-productive cough,  No coughing up of blood.  No change in color of mucus.  No wheezing.  No chest wall deformity  Skin: no rash or lesions.  GU: no dysuria, change in color of urine, no urgency or frequency.  No flank pain, no hematuria   MS:  +joint pain   +chronic  back pain.  Psych:  No change in mood or affect. No depression or anxiety.  No memory loss.          Objective:   Physical Exam  Filed Vitals:   01/24/11 1511  BP: 150/78  Pulse: 68  Temp: 98 F (36.7 C)  TempSrc: Oral  Height: 6\' 2"  (1.88 m)  Weight: 220 lb (99.791 kg)  SpO2: 95%    Gen: Pleasant, well-nourished, in no distress,  normal affect  ENT: No lesions,  mouth clear,  oropharynx clear, no postnasal drip  Neck: ++JVD, no TMG, no carotid bruits  Lungs: No use of accessory muscles, no dullness to percussion, decreased BS 1/2 way up bilate.  Cardiovascular: RRR, heart sounds normal, no murmur or gallops, ++ peripheral edema  Abdomen: soft and NT, no HSM,  BS normal  Musculoskeletal: No deformities, no cyanosis or clubbing  Neuro: alert, non focal  Skin: echymoses  Dg Chest 2 View  01/24/2011  *RADIOLOGY REPORT*  Clinical Data: Dyspnea.  Pleural effusions.  CHEST - 2 VIEW  Comparison: 08/18/2010  Findings: Status post median sternotomy and CABG procedure.  Bilateral loculated pleural effusions are again noted and appear unchanged from previous exam.  Plate-like  atelectasis noted in the lung bases.  IMPRESSION:  1.  Bilateral loculated pleural effusions.  Unchanged from previous exam.  Original Report Authenticated By: Rosealee Albee, M.D.      Assessment & Plan:   PLEURAL EFFUSION, RIGHT Increasing dyspnea on the basis of mild pulmonary edema, or effusions are stable and loculated Plan increase lasix to 60mg  twice daily  Take metolazone /zaroxolyn one daily for 3 days Return 5 days to see  Tammy Parrett with labs to check potassium Watch salt intake Make an appt to see Cardiology soon       Updated Medication List Outpatient Encounter Prescriptions as of 01/24/2011  Medication Sig Dispense Refill  . aspirin EC 81 MG tablet Take 1 tablet (81 mg total) by mouth daily.  150 tablet  2  . atorvastatin (LIPITOR) 40 MG tablet Take 40 mg by mouth daily.        . carbidopa-levodopa (SINEMET CR) 25-100 MG per tablet Take 1 tablet by mouth 3 (three) times daily.        . Cholecalciferol (VITAMIN D) 1000 UNITS capsule Take 1,000 Units by mouth daily.        . Cholecalciferol (VITAMIN D3) 1000 UNITS CAPS Take 10,000 mg by mouth. Twice a week.      . clobetasol (OLUX) 0.05 % topical foam as directed.      . ezetimibe (ZETIA) 10 MG tablet Take 10 mg by mouth daily.        . Ferrous Sulfate (IRON) 325 (65 FE) MG TABS Take by mouth daily.        . Fluticasone-Salmeterol (ADVAIR DISKUS) 250-50 MCG/DOSE AEPB Inhale 1 puff into the lungs every 12 (twelve) hours.        . furosemide (LASIX) 40 MG tablet Take 1.5 tablets (60 mg total) by mouth 2 (two) times daily. Take 2 tablets in the morning and one tablet in the afternoon  60 tablet  6  . Insulin Aspart (NOVOLOG [place]) Inject into the skin. Via pump as directed        . isosorbide mononitrate (IMDUR) 30 MG 24 hr tablet Take 1 tablet by mouth Daily.      . LamoTRIgine (LAMICTAL ODT PO) Take by mouth as directed.        . magnesium oxide (MAG-OX) 400 MG tablet Take 400 mg by mouth 2 (two) times daily.         . mycophenolate (CELLCEPT) 500 MG tablet Take by mouth 2 (two) times daily.        Marland Kitchen oxyCODONE (OXY IR/ROXICODONE) 5 MG immediate release tablet Take 1 tablet by mouth Twice daily.      . pantoprazole (PROTONIX) 40 MG tablet Take 40 mg by mouth 2 (two) times daily.       Marland Kitchen PLAVIX 75 MG tablet TAKE ONE (1) TABLET(S) ONCE DAILY  30 tablet  6  . predniSONE (DELTASONE) 5 MG tablet Take 5 mg by mouth daily.        Marland Kitchen pyridostigmine (MESTINON) 60 MG tablet 2 every am and 1 every pm       . saxagliptin HCl (ONGLYZA) 5 MG TABS tablet Take 5 mg by mouth daily.        . tacrolimus (PROGRAF) 5 MG capsule Take 5 mg by mouth 2 (two) times daily.        . Tamsulosin HCl (FLOMAX) 0.4 MG CAPS Take by mouth daily.        . temazepam (RESTORIL) 15 MG capsule Take 15 mg by mouth at bedtime as needed.        . Vilazodone HCl (VIIBRYD) 40 MG TABS Take 1 tablet by mouth every morning.        Marland Kitchen DISCONTD: furosemide (LASIX) 40 MG tablet Take 2 tablets in the morning and one tablet in the afternoon       . DISCONTD: pyridostigmine (MESTINON) 60 MG tablet Take 1 and 1/2 tablets twice a day.  90 tablet  6  . albuterol (PROVENTIL HFA;VENTOLIN HFA) 108 (90 BASE) MCG/ACT inhaler Inhale 2 puffs into the lungs every 6 (six) hours as needed for wheezing.  1 Inhaler  0  . metolazone (ZAROXOLYN) 2.5 MG tablet One tablet daily for 3 days then stop  3 tablet  0  . DISCONTD: DULoxetine (CYMBALTA) 60 MG capsule Take 60 mg by mouth daily.

## 2011-01-24 NOTE — Patient Instructions (Signed)
Increase lasix to 60mg  twice daily  Take metolazone /zaroxolyn one daily for 3 days Return 5 days to see Tammy Parrett with labs to check potassium Watch salt intake Make an appt to see Cardiology soon

## 2011-01-25 NOTE — Assessment & Plan Note (Signed)
Increasing dyspnea on the basis of mild pulmonary edema, or effusions are stable and loculated Plan increase lasix to 60mg  twice daily  Take metolazone /zaroxolyn one daily for 3 days Return 5 days to see Tammy Parrett with labs to check potassium Watch salt intake Make an appt to see Cardiology soon

## 2011-01-30 ENCOUNTER — Telehealth: Payer: Self-pay | Admitting: Cardiology

## 2011-01-30 ENCOUNTER — Ambulatory Visit (INDEPENDENT_AMBULATORY_CARE_PROVIDER_SITE_OTHER): Payer: Medicare Other | Admitting: Adult Health

## 2011-01-30 ENCOUNTER — Encounter: Payer: Self-pay | Admitting: Adult Health

## 2011-01-30 ENCOUNTER — Other Ambulatory Visit (INDEPENDENT_AMBULATORY_CARE_PROVIDER_SITE_OTHER): Payer: Medicare Other

## 2011-01-30 VITALS — BP 150/82 | HR 84 | Temp 98.9°F | Ht 74.0 in | Wt 215.6 lb

## 2011-01-30 DIAGNOSIS — R0609 Other forms of dyspnea: Secondary | ICD-10-CM

## 2011-01-30 DIAGNOSIS — I5032 Chronic diastolic (congestive) heart failure: Secondary | ICD-10-CM

## 2011-01-30 DIAGNOSIS — R0600 Dyspnea, unspecified: Secondary | ICD-10-CM

## 2011-01-30 DIAGNOSIS — J449 Chronic obstructive pulmonary disease, unspecified: Secondary | ICD-10-CM

## 2011-01-30 DIAGNOSIS — I509 Heart failure, unspecified: Secondary | ICD-10-CM

## 2011-01-30 LAB — BASIC METABOLIC PANEL
Chloride: 100 mEq/L (ref 96–112)
Potassium: 4.1 mEq/L (ref 3.5–5.1)
Sodium: 140 mEq/L (ref 135–145)

## 2011-01-30 NOTE — Assessment & Plan Note (Signed)
Recent flare with associated vol. Overload-now improved with diuresis   Plan  Resume Lasix at 120mg  daily

## 2011-01-30 NOTE — Progress Notes (Signed)
Subjective:    Patient ID: John [name], male    DOB: 10/23/1940, 70 y.o.   MRN: [id number]  HPI 70 yo with history of renal transplant on chronic immunosuppressants, DM-insulin dependent, HTN, , Chronic back pain and CAD s/p CABG . Patient had a long and complicated hospital admission in 1/11 s/p CABG x 5.   Post-op course was complicated by pulmonary edema requiring intubation, renal failure requiring CVVH, and atrial fibrillation. He was in the hospital about 7 weeks. In 11/11, patient had right SFA and popliteal atherectomy that was complicated by a groin pseudoaneurysm. Hx of orthostatic hypotension, ShyDrager, and severe tremor followed by D[facility]meier    07/14/10 -  presents with pleural effusion. The patient has no history of history of diagnosed COPD, shortness of breath, chest tightness, chest pain worse with breathing and coughing, wheezing, cough, mucous production, nocturnal awakening, exercise induced symptoms, and congestion. The patient comes in today for initial evaluation of pleural effusion. Evaluation to date has included CXR. The chest X-ray reveals a right pleural effusion.  This pt has a loculated R pleural effusion on CXR. This pt had a complicated course after CABG 2/11. Last CXR showed effusion R lung 3/11. No films since. PT is s/p renal TPL 2002. There are no new symptoms, no cough, no chest pain. No pulm complaints at all PT has fatigue in rehab only.  Pt seen today 07/14/10 as a workin for ongoing dyspnea. In 11/11 we elected to watch the effusion on R.  See symptoms as above for dyspnea and cough  >>right thoracentesis and Avelox.   07/26/10 Follow up  Pt returns for 2 week follow up. Seen last ov with persistent DOE, dry cough and weakness. Set up for a right thoracentesis w/ 0.5 liters pulled off.  Path was neg for malignant cells. NO growth on cx.  Post thoracetesis film with decreased effusion size. Was given Avelox for 5 days . Pt says he could take deeper breath but  has seen no real change in his dyspnea level.   Main complaint is he is so fatigued that he wears out with minimal activity that is associated with dyspnea. He gets fatigued and winded just getting ready for bed. Most trouble is with inclines and if he has to exert himself.   Labs last ov were essentially unremarbable with improved BNP ~150. Hbg at 11.4. TSH nml.   He is on numerous meds incluiding ACE inhbitor. Says his dry cough is minimal and does not consider bothersome. He denies wheeze.  He was given Asmanex last ov but did not feel it helped.   Today we did in office spirometry. Pt had great difficulty completing this test. Poor quality -FEV1 1.35  l/m (35%) He smoked on/off for >20 years >1PPD beginning at age 89.   He does snore some and feels tired in am. Significant fatigue ? Daytime hypersolomenence.   08/18/10 At last ov   The Np recommended Suspect this is multifactoral in nature with underlying diastolic dysfunction. Fluid volume appears improved with  Increased diuretics. However he is very complicated with hx of orhtostatic hypotension (on ACE inhibitor and diurectics)  He complains severe fatigue which could be side effect of polypharmacy -he is on chronic pain med, b/p med, diuretics, parkinson meds which could all be contributing.  His spirometry was poor quality due to inability to give a good exhalation however it does show decreased lung fnx. Maybe once he is  A little stronger will set up  for a full PFT - FEV1 today shows 1.35l/m (35%). He has a hx of heavy smoking-will begin Spiriva today -2 sample given.  Set up for an overnight oximetry test. He does desat minimally at 89% which does not qualify him for O2.  Will await ONO results. Also may want to consider sleep test in future - he does have some risk factors for this.  Have him come back in 2 weeks for close follow up .   Constipation with spiriva now.  Stomach is upset and burping all the time.  Notes pain in pit  of the stomach.  Notes constant spiriva. Could breathe deeper.  Cant get pills down, food lodges and has to throw up.   No chest pain  Dr [name] wants copy of records  7/26 Pills lodge in esophagus and will cough.  Saw PCP.  C/o dysphagia.  Had Bar swallow   Past Medical History:  1. Diabetes mellitus  2. CKD s/p renal transplant 2002 Dr [name]  3. HTN  4. PAD: Recent atherectomy right SFA and right popliteal on 01/12/10. Complicated by pseudoaneurysm.  5. Lumbar disc disease with low back pain  6. Diabetic gastroparesis  7. Prostate cancer s/p seed implantation in 1/06.  8. Erectile dysfunction  9. Tremor  10. Obesity  11. Diastolic CHF: Last echo (4/11) with inferior and septal hypokinesis, moderate LV hypertrophy, EF 45-50%, mild left atrial enlargement.  12. CAD: Lexiscan myoview (2/11) with EF 44%, fixed inferior defect. Cath (2/11) with 60% distal left main, 90% ostial LAD involving D1, 95% ostial large ramus, probable significant ostial CFX stenosis, 90% PDA, 70% PLV. Paitent had CABG with LIMA-LAD, SVG-D, sequential SVG-ramus and OM2, SVG-PLV. Lexsican myoview (5/12) with EF 59%, partially reversible inferior defect.  13. Atrial fibrillation post-op CABG  14. Orthostatic hypotension: Some improvement with pyridostigmine.  15. Low testosterone  16. Loculated right>left pleural effusion   12/4 Pt notes for one month, more dyspnea. Prior to this was ok.  Seemed like sudden in onset.   Had been on QHS oxygen 2L and prn daytime.  81% on arrival on RA. Now notes no cough.  No real chest pain.  No real wheeze.  Sl edema in feet.  Still with dysphagia,  Sees GI and they did not feel needs dilation  (esoph,  Schooler) No QHS cough . >>Increase lasix to 60mg  twice daily , along with zaroxolyn one daily for 3 days   01/30/2011 Follow up /labs  Pt returns for follow up . Last ov with increased dyspnea. Diuretics were increased to lasix 60 mg Twice daily  And zaroxlyn added x 3 days.  Since  last ov feels some better. No significant changes. Has good and bad days.  Labs today show  Weight down 5 lbs .  No chest pain , n/v/d or increased edema.    Past Medical History  Diagnosis Date  . CAD (coronary artery disease)     a. myoview 1/11: lg inf fixed defect;   b. cath 1/11: 3v CAD,  c. s/p CABG 1/11 c/b acute renal failure and AFib  . Atrial fibrillation   . Chronic diastolic heart failure     a. echo 4/11: EF 45-50%, mod LVH, LAE, inf and septal HK  . First degree AV block   . Orthostatic hypotension     pyridostigmine  . History of renal transplant   . DM2 (diabetes mellitus, type 2)   . Diabetic gastroparesis   . HTN (hypertension)   .  HLD (hyperlipidemia)   . PAD (peripheral artery disease)     s/p R SFA-Pop atherectomy 11/11  . Prostate cancer   . ED (erectile dysfunction)   . DDD (degenerative disc disease), lumbar   . Tremor   . Obesity   . Low testosterone   . Chronic kidney disease     s/p renal transplant 2002  . Pleural effusion, right     loculated  . COPD (chronic obstructive pulmonary disease)      Family History  Problem Relation Age of Onset  . Coronary artery disease Mother   . Valvular heart disease Father   . Hepatitis      sibling  . Coronary artery disease Sister   . Heart attack Sister   . Tuberculosis Father      History   Social History  . Marital Status: Married    Spouse Name: N/A    Number of Children: 3  . Years of Education: N/A   Occupational History  . retired Clinical research associate    Social History Main Topics  . Smoking status: Former Smoker -- 1.5 packs/day for 20 years    Types: Cigarettes    Quit date: 02/20/1978  . Smokeless tobacco: Never Used   Comment: smoked 1 ppd, stopped 1980  . Alcohol Use: No  . Drug Use: No  . Sexually Active: Not on file   Other Topics Concern  . Not on file   Social History Narrative  . No narrative on file     No Known Allergies   Outpatient Prescriptions Prior to Visit    Medication Sig Dispense Refill  . albuterol (PROVENTIL HFA;VENTOLIN HFA) 108 (90 BASE) MCG/ACT inhaler Inhale 2 puffs into the lungs every 6 (six) hours as needed for wheezing.  1 Inhaler  0  . aspirin EC 81 MG tablet Take 1 tablet (81 mg total) by mouth daily.  150 tablet  2  . atorvastatin (LIPITOR) 40 MG tablet Take 40 mg by mouth daily.        . carbidopa-levodopa (SINEMET CR) 25-100 MG per tablet Take 1 tablet by mouth 3 (three) times daily.        . Cholecalciferol (VITAMIN D) 1000 UNITS capsule Take 1,000 Units by mouth daily.        . Cholecalciferol (VITAMIN D3) 1000 UNITS CAPS Take 10,000 mg by mouth. Twice a week.      . clobetasol (OLUX) 0.05 % topical foam as directed.      . ezetimibe (ZETIA) 10 MG tablet Take 10 mg by mouth daily.        . Ferrous Sulfate (IRON) 325 (65 FE) MG TABS Take by mouth daily.        . Fluticasone-Salmeterol (ADVAIR DISKUS) 250-50 MCG/DOSE AEPB Inhale 1 puff into the lungs every 12 (twelve) hours.        . furosemide (LASIX) 40 MG tablet Take 1.5 tablets (60 mg total) by mouth 2 (two) times daily. Take 2 tablets in the morning and one tablet in the afternoon  60 tablet  6  . Insulin Aspart (NOVOLOG [place]) Inject into the skin. Via pump as directed        . isosorbide mononitrate (IMDUR) 30 MG 24 hr tablet Take 1 tablet by mouth Daily.      . LamoTRIgine (LAMICTAL ODT PO) Take by mouth as directed.        . magnesium oxide (MAG-OX) 400 MG tablet Take 400 mg by mouth 2 (two) times daily.       Marland Kitchen  mycophenolate (CELLCEPT) 500 MG tablet Take by mouth 2 (two) times daily.        Marland Kitchen oxyCODONE (OXY IR/ROXICODONE) 5 MG immediate release tablet Take 1 tablet by mouth Twice daily.      . pantoprazole (PROTONIX) 40 MG tablet Take 40 mg by mouth 2 (two) times daily.       Marland Kitchen PLAVIX 75 MG tablet TAKE ONE (1) TABLET(S) ONCE DAILY  30 tablet  6  . predniSONE (DELTASONE) 5 MG tablet Take 5 mg by mouth daily.        Marland Kitchen pyridostigmine (MESTINON) 60 MG tablet 2 every am and 1  every pm       . saxagliptin HCl (ONGLYZA) 5 MG TABS tablet Take 5 mg by mouth daily.        . tacrolimus (PROGRAF) 5 MG capsule Take 5 mg by mouth 2 (two) times daily.        . Tamsulosin HCl (FLOMAX) 0.4 MG CAPS Take by mouth daily.        . temazepam (RESTORIL) 15 MG capsule Take 15 mg by mouth at bedtime as needed.        . Vilazodone HCl (VIIBRYD) 40 MG TABS Slow taper as directed         Review of Systems  Constitutional:   No  weight loss, night sweats,  Fevers, chills,  +fatigue, or  lassitude.  HEENT:   No headaches,  Difficulty swallowing,  Tooth/dental problems, or  Sore throat,                No sneezing, itching, ear ache, nasal congestion, post nasal drip,   CV:  No chest pain,  Orthopnea, PND, swelling in lower extremities,  , dizziness,     GI  No heartburn, indigestion, abdominal pain, nausea, vomiting, diarrhea, change in bowel habits, loss of appetite, bloody stools.   Resp: +shortness of breath with exertion or at rest.   No excess mucus, no productive cough,  No coughing up of blood.  No change in color of mucus.  No wheezing.  No chest wall deformity  Skin: no rash or lesions.  GU: no dysuria, change in color of urine, no urgency or frequency.  No flank pain, no hematuria   MS:  +joint pain   +chronic  back pain.  Psych:  No change in mood or affect. No depression or anxiety.  No memory loss.          Objective:   Physical Exam  Filed Vitals:   01/30/11 1412  BP: 150/82  Pulse: 84  Temp: 98.9 F (37.2 C)  TempSrc: Oral  Height: 6\' 2"  (1.88 m)  Weight: 215 lb 9.6 oz (97.796 kg)  SpO2: 96%    Gen: Pleasant, well-nourished, in no distress,  normal affect  ENT: No lesions,  mouth clear,  oropharynx clear, no postnasal drip  Neck: ++JVD, no TMG, no carotid bruits  Lungs: No use of accessory muscles, no dullness to percussion diminshed BS in bases   Cardiovascular: RRR, heart sounds normal, no murmur or gallops, tr  peripheral  edema  Abdomen: soft and NT, no HSM,  BS normal  Musculoskeletal: No deformities, no cyanosis or clubbing  Neuro: alert, non focal  Skin: echymoses       Assessment & Plan:   CHRONIC DIASTOLIC HEART FAILURE Recent flare with associated vol. Overload-now improved with diuresis   Plan  Resume Lasix at 120mg  daily    COPD (chronic obstructive pulmonary disease) Compensated  on present regimen  Cont on current regimen.        Updated Medication List Outpatient Encounter Prescriptions as of 01/30/2011  Medication Sig Dispense Refill  . albuterol (PROVENTIL HFA;VENTOLIN HFA) 108 (90 BASE) MCG/ACT inhaler Inhale 2 puffs into the lungs every 6 (six) hours as needed for wheezing.  1 Inhaler  0  . aspirin EC 81 MG tablet Take 1 tablet (81 mg total) by mouth daily.  150 tablet  2  . atorvastatin (LIPITOR) 40 MG tablet Take 40 mg by mouth daily.        . carbidopa-levodopa (SINEMET CR) 25-100 MG per tablet Take 1 tablet by mouth 3 (three) times daily.        . Cholecalciferol (VITAMIN D) 1000 UNITS capsule Take 1,000 Units by mouth daily.        . Cholecalciferol (VITAMIN D3) 1000 UNITS CAPS Take 10,000 mg by mouth. Twice a week.      . clobetasol (OLUX) 0.05 % topical foam as directed.      . ezetimibe (ZETIA) 10 MG tablet Take 10 mg by mouth daily.        . Ferrous Sulfate (IRON) 325 (65 FE) MG TABS Take by mouth daily.        . Fluticasone-Salmeterol (ADVAIR DISKUS) 250-50 MCG/DOSE AEPB Inhale 1 puff into the lungs every 12 (twelve) hours.        . furosemide (LASIX) 40 MG tablet Take 1.5 tablets (60 mg total) by mouth 2 (two) times daily. Take 2 tablets in the morning and one tablet in the afternoon  60 tablet  6  . Insulin Aspart (NOVOLOG [place]) Inject into the skin. Via pump as directed        . isosorbide mononitrate (IMDUR) 30 MG 24 hr tablet Take 1 tablet by mouth Daily.      . LamoTRIgine (LAMICTAL ODT PO) Take by mouth as directed.        . magnesium oxide (MAG-OX) 400 MG  tablet Take 400 mg by mouth 2 (two) times daily.       . mycophenolate (CELLCEPT) 500 MG tablet Take by mouth 2 (two) times daily.        Marland Kitchen oxyCODONE (OXY IR/ROXICODONE) 5 MG immediate release tablet Take 1 tablet by mouth Twice daily.      . pantoprazole (PROTONIX) 40 MG tablet Take 40 mg by mouth 2 (two) times daily.       Marland Kitchen PLAVIX 75 MG tablet TAKE ONE (1) TABLET(S) ONCE DAILY  30 tablet  6  . predniSONE (DELTASONE) 5 MG tablet Take 5 mg by mouth daily.        Marland Kitchen pyridostigmine (MESTINON) 60 MG tablet 2 every am and 1 every pm       . saxagliptin HCl (ONGLYZA) 5 MG TABS tablet Take 5 mg by mouth daily.        . tacrolimus (PROGRAF) 5 MG capsule Take 5 mg by mouth 2 (two) times daily.        . Tamsulosin HCl (FLOMAX) 0.4 MG CAPS Take by mouth daily.        . temazepam (RESTORIL) 15 MG capsule Take 15 mg by mouth at bedtime as needed.        . Vilazodone HCl (VIIBRYD) 40 MG TABS Slow taper as directed

## 2011-01-30 NOTE — Assessment & Plan Note (Signed)
Compensated on present regimen  Cont on current regimen .  

## 2011-01-30 NOTE — Patient Instructions (Signed)
Begin Spiriva 1 puff daily  Cont on Lasix 120mg  daily (this was your previous dose) follow up Dr. Delford Field  In 6 weeks and As needed

## 2011-01-31 ENCOUNTER — Ambulatory Visit (INDEPENDENT_AMBULATORY_CARE_PROVIDER_SITE_OTHER): Payer: Medicare Other | Admitting: Cardiology

## 2011-01-31 ENCOUNTER — Encounter: Payer: Self-pay | Admitting: Cardiology

## 2011-01-31 DIAGNOSIS — I7389 Other specified peripheral vascular diseases: Secondary | ICD-10-CM

## 2011-01-31 DIAGNOSIS — I440 Atrioventricular block, first degree: Secondary | ICD-10-CM

## 2011-01-31 DIAGNOSIS — I509 Heart failure, unspecified: Secondary | ICD-10-CM

## 2011-01-31 DIAGNOSIS — E785 Hyperlipidemia, unspecified: Secondary | ICD-10-CM

## 2011-01-31 DIAGNOSIS — J449 Chronic obstructive pulmonary disease, unspecified: Secondary | ICD-10-CM

## 2011-01-31 DIAGNOSIS — I5032 Chronic diastolic (congestive) heart failure: Secondary | ICD-10-CM

## 2011-01-31 DIAGNOSIS — I951 Orthostatic hypotension: Secondary | ICD-10-CM

## 2011-01-31 DIAGNOSIS — I25810 Atherosclerosis of coronary artery bypass graft(s) without angina pectoris: Secondary | ICD-10-CM

## 2011-01-31 MED ORDER — ISOSORBIDE MONONITRATE 60 MG PO TB24: 60.0000 mg | ORAL_TABLET | Freq: Every day | ORAL | Status: DC

## 2011-01-31 NOTE — Assessment & Plan Note (Addendum)
Better off lisinopril.  Continue pyridostigmine. Will let supine BP run a bit higher than usual goal given very symptomatic orthostasis on even low doses of  BP-active meds.

## 2011-01-31 NOTE — Assessment & Plan Note (Signed)
LDL at goal in 10/12 (< 70).

## 2011-01-31 NOTE — Assessment & Plan Note (Signed)
This plays a significant role in his exertional dyspnea.  He has home oxygen.

## 2011-01-31 NOTE — Assessment & Plan Note (Signed)
John [name] does not appear volume overloaded on exam.  Filling pressures were near-normal on recent RHC.  I would continue the current dose of Lasix.  Would avoid further metolazone at this point.  Breathing is back to baseline.

## 2011-01-31 NOTE — Assessment & Plan Note (Signed)
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  This a small caliber vessel and not amenable to intervention.  His prior anginal equivalent has been dyspnea.  I think that some portion of his exertional dypsnea may be explainable by ischemia.  He had some improvement after starting Imdur.  I am going to increase Imdur to 60 mg daily.  He will also continue ASA 81, statin, Plavix.  No beta blocker or ACEI due to orthostatic hypotension.

## 2011-01-31 NOTE — Patient Instructions (Signed)
INCREASE IMDUR to 60 mg daily  Keep appointment previously made

## 2011-01-31 NOTE — Progress Notes (Signed)
Patient ID: John [name], male   DOB: 05/17/1940, 70 y.o.   MRN: [id number] PCP: Dr. Felipa [name]  70 yo with history of renal transplant, DM, HTN, and CAD s/p CABG returns for followup.  Patient had a long and complicated hospital admission in 1/11.  Patient was admitted to [facility] at that time with a UTI and pulmonary edema.  Troponin was noted to be elevated and the patient had a nuclear scan with a large fixed inferior defect.  He ended up having a cardiac catheterization showing severe disease involving the LM, LAD, ramus, CFX and RCA.  He had CABG x 5.  Post-op course was complicated by pulmonary edema requiring intubation, renal failure requiring CVVH, and atrial fibrillation.  He was in the hospital about 7 weeks.  In 11/11, patient had right SFA and popliteal atherectomy that was complicated by a left groin pseudoaneurysm.   Patient developed increased dyspnea earlier this year.  Lexiscan myoview in 5/12 showed a partially reversible inferior defect.  Echo 6/12 showed preserved EF of 60%, moderate LVH, normal RV, and very mild AS.  I increased his Lasix, with some weight loss.  He saw Dr. Delford [name] and had right-sided thoracentesis with removal of 500 cc of fluid.  Cytology was negative for malignancy.  In-office FEV1 was quite low (35% predicted) suggesting significant COPD, so he was started on Spiriva. He was unable to tolerate Spiriva due to nausea and is now on Advair.  Given recurrence of orthostatic hypotension symptoms, I also increased his pyridostigmine.   Earlier this fall, John [name] reported worsening orthostatic-type lightheadedness and worsening exertional dyspnea.  I decided at that point to take him for right and left heart cath in 10/12.  On Lasix, his right and left heart filling pressures were not significantly elevated.  His bypass grafts were patent but he had an ungrafted PDA with 95% stenosis (small vessel that would not be amenable to PCI).  As this was a possible source of  ischemia and could potentially cause exertional dyspnea (his anginal equivalent was dyspnea in the past), I started him on Imdur 30 mg daily.   I also took him off lisinopril due to worsening orthostatic symptoms.    He saw Dr. Delford [name] again recently with recurrent worsening of his dyspnea.  He was thought to be volume overloaded and took metolazone for 3 days in a row but has not taken it since.  He continues on Lasix 80 qam, 40 qpm.  He does not think that the metolazone helped much.  His breathing at this point, however, seems to be back to baseline.  He says that it waxes and wanes with no real pattern.  Main problem now continues to be low back pain and sciatica.  This has been extremely limiting.  Orthostatic symptoms are better off lisinopril.   Labs (9/11): LDL 71, HDL 45 Labs (12/11): K 4.9, creatinine 0.9, HCT 37.1  Labs (5/12): K 4.9, creatinine 0.9, BNP 157, HCT 36.4 Labs (5/12): LDL 42, HDL 44, K 3.7, creatinine 1.1, BNP 105 Labs (6/12): K 5, creatinine 1.1, TSH normal Labs (10/12): LDL 53, HDL 47, K 4.3, creatinine 9.14, BNP 197 Labs (12/12): K 4.1, creatinine 1.3  Allergies (verified):  No Known Drug Allergies  Past Medical History: 1. Diabetes mellitus 2. CKD s/p renal transplant 2002 3. HTN 4. PAD: Recent atherectomy right SFA and right popliteal on 01/12/10.  Complicated by left groin pseudoaneurysm.  5. Lumbar disc disease with low back pain 6. Diabetic  gastroparesis 7. Prostate cancer s/p seed implantation in 1/06.  8. Erectile dysfunction 9. Tremor 10.  Obesity 11.  Diastolic CHF: Last echo (6/12) showed EF 60%, moderate LV hypertrophy, very mild AS, normal RV size and systolic function. 12.  CAD: Lexiscan myoview (2/11) with EF 44%,  fixed inferior defect.  Cath (2/11) with 60% distal left main, 90% ostial LAD involving D1, 95% ostial large ramus, probable significant ostial CFX stenosis, 90% PDA, 70% PLV.  Paitent had CABG with LIMA-LAD, SVG-D, sequential SVG-ramus  and OM2, SVG-PLV.  Lexiscan myoview (5/12) with EF 59%, partially reversible inferior defect.  LHC/RHC (10/12): RA 6, PA 38/12, PCWP 9, CI 2.8; SVG-OM and ramus patent, LIMA-LAD patent, SVG-D1 patent with 60% stenosis at touchdown, SVG-PLV patent; there was a 95% PDA stenosis (seen on study prior to CABG) that was ungrafted.  The PDA was a small caliber vessel and not well-suited to PCI.  This could be a source of exertional symptoms.   13.  Atrial fibrillation post-op CABG 14.  Orthostatic hypotension:  improvement with pyridostigmine.  15.  Low testosterone 16.  Loculated right>left pleural effusion: Right thoracentesis (6/12), cytology negative for malignancy.  17.  COPD: Suspected.  FEV1 35% (in-office test).   18.  Carotid dopplers (6/12): 0-39% bilateral ICA stenosis.  19.  Depression 20.  Lumbar disc disease 21.  Heart block: Patient has had profound 1st degree AV block on ECG.  Holter (11/12) showed NSR, 1st degree heart block, occasional short runs of type I 2nd degree heart block.   Family History: Noncontributory  Social History: Married, retired Clinical research associate, lives in [place].  Prior smoker.  Review of Systems        All systems reviewed and negative except as per HPI.   Current Outpatient Prescriptions  Medication Sig Dispense Refill  . albuterol (PROVENTIL HFA;VENTOLIN HFA) 108 (90 BASE) MCG/ACT inhaler Inhale 2 puffs into the lungs every 6 (six) hours as needed for wheezing.  1 Inhaler  0  . aspirin EC 81 MG tablet Take 1 tablet (81 mg total) by mouth daily.  150 tablet  2  . atorvastatin (LIPITOR) 40 MG tablet Take 40 mg by mouth daily.        . carbidopa-levodopa (SINEMET CR) 25-100 MG per tablet Take 1 tablet by mouth 3 (three) times daily.        . Cholecalciferol (VITAMIN D) 1000 UNITS capsule Take 1,000 Units by mouth daily.        . Cholecalciferol (VITAMIN D3) 1000 UNITS CAPS Take 10,000 mg by mouth. Twice a week.      . clobetasol (OLUX) 0.05 % topical foam as  directed.      . ezetimibe (ZETIA) 10 MG tablet Take 10 mg by mouth daily.        . Ferrous Sulfate (IRON) 325 (65 FE) MG TABS Take by mouth daily.        . Fluticasone-Salmeterol (ADVAIR DISKUS) 250-50 MCG/DOSE AEPB Inhale 1 puff into the lungs every 12 (twelve) hours.        . furosemide (LASIX) 40 MG tablet Take 1.5 tablets (60 mg total) by mouth 2 (two) times daily. Take 2 tablets in the morning and one tablet in the afternoon  60 tablet  6  . Insulin Aspart (NOVOLOG ) Inject into the skin. Via pump as directed        . isosorbide mononitrate (IMDUR) 60 MG 24 hr tablet Take 1 tablet (60 mg total) by mouth daily.  30 tablet  11  . LamoTRIgine (LAMICTAL ODT PO) Take by mouth as directed.        . magnesium oxide (MAG-OX) 400 MG tablet Take 400 mg by mouth 2 (two) times daily.       . mycophenolate (CELLCEPT) 500 MG tablet Take by mouth 2 (two) times daily.        Marland Kitchen oxyCODONE (OXY IR/ROXICODONE) 5 MG immediate release tablet Take 1 tablet by mouth Twice daily.      . pantoprazole (PROTONIX) 40 MG tablet Take 40 mg by mouth 2 (two) times daily.       Marland Kitchen PLAVIX 75 MG tablet TAKE ONE (1) TABLET(S) ONCE DAILY  30 tablet  6  . predniSONE (DELTASONE) 5 MG tablet Take 5 mg by mouth daily.        Marland Kitchen pyridostigmine (MESTINON) 60 MG tablet 2 every am and 1 every pm       . saxagliptin HCl (ONGLYZA) 5 MG TABS tablet Take 5 mg by mouth daily.        . tacrolimus (PROGRAF) 5 MG capsule Take 5 mg by mouth 2 (two) times daily.        . Tamsulosin HCl (FLOMAX) 0.4 MG CAPS Take by mouth daily.        . temazepam (RESTORIL) 15 MG capsule Take 15 mg by mouth at bedtime as needed.        . Vilazodone HCl (VIIBRYD) 40 MG TABS Slow taper as directed        BP 154/71  Pulse 68  Ht 6\' 2"  (1.88 m)  Wt 97.977 kg (216 lb)  BMI 27.73 kg/m2 General: NAD, overweight Neck: No JVD, no thyromegaly or thyroid nodule.  Lungs: Distant breath sounds bilaterally. CV: Nondisplaced PMI.  Heart regular S1/S2, no S3/S4, 2/6  early SEM RUSB.  No ankle edema.  Normal pedal pulses.  Right carotid bruit.  Abdomen: Soft, nontender, no hepatosplenomegaly, no distention.  Neurologic: Alert and oriented x 3.  Psych: Depressed affect Extremities: No clubbing or cyanosis.

## 2011-01-31 NOTE — Assessment & Plan Note (Signed)
Per Dr. McAlhany.  No claudication.  

## 2011-01-31 NOTE — Assessment & Plan Note (Signed)
Rather profound 1st degree AV block.  Recent holter showed brief runs of Wenckebach 2nd degree block but no more significant arrhythmias.

## 2011-02-17 ENCOUNTER — Telehealth: Payer: Self-pay | Admitting: Critical Care Medicine

## 2011-02-17 MED ORDER — TIOTROPIUM BROMIDE MONOHYDRATE 18 MCG IN CAPS: 18.0000 ug | ORAL_CAPSULE | Freq: Every day | RESPIRATORY_TRACT | Status: DC

## 2011-02-17 NOTE — Telephone Encounter (Signed)
Spoke with patient and stated spiriva worked well. And is requesting an rx be sent to [facility] on [address]. Patient is aware and rx sent; No Known Allergies

## 2011-02-24 ENCOUNTER — Other Ambulatory Visit: Payer: Self-pay | Admitting: Cardiology

## 2011-03-10 ENCOUNTER — Other Ambulatory Visit: Payer: Self-pay | Admitting: Cardiology

## 2011-03-15 ENCOUNTER — Ambulatory Visit (INDEPENDENT_AMBULATORY_CARE_PROVIDER_SITE_OTHER): Payer: Medicare Other | Admitting: Critical Care Medicine

## 2011-03-15 ENCOUNTER — Encounter: Payer: Self-pay | Admitting: Critical Care Medicine

## 2011-03-15 VITALS — BP 128/66 | HR 66 | Temp 98.1°F | Ht 74.0 in | Wt 223.0 lb

## 2011-03-15 DIAGNOSIS — J449 Chronic obstructive pulmonary disease, unspecified: Secondary | ICD-10-CM

## 2011-03-15 NOTE — Patient Instructions (Signed)
Increase oxygen use to 2Liter rest and 4Liter exertion.  We will send order to your home care company Stay on Advair/spiriva for now No other medication changes Return 6 weeks

## 2011-03-15 NOTE — Progress Notes (Signed)
Subjective:    Patient ID: John [name], male    DOB: 09/22/1940, 71 y.o.   MRN: [id number]  HPI 71 y.o.  with history of renal transplant on chronic immunosuppressants, DM-insulin dependent, HTN, , Chronic back pain and CAD s/p CABG . Patient had a long and complicated hospital admission in 1/11 s/p CABG x 5.   Post-op course was complicated by pulmonary edema requiring intubation, renal failure requiring CVVH, and atrial fibrillation. He was in the hospital about 7 weeks. In 11/11, patient had right SFA and popliteal atherectomy that was complicated by a groin pseudoaneurysm. Hx of orthostatic hypotension, ShyDrager, and severe tremor followed by [facility]       12/4 Pt notes for one month, more dyspnea. Prior to this was ok.  Seemed like sudden in onset.   Had been on QHS oxygen 2L and prn daytime.  81% on arrival on RA. Now notes no cough.  No real chest pain.  No real wheeze.  Sl edema in feet.  Still with dysphagia,  Sees GI and they did not feel needs dilation  (esoph,  Schooler) No QHS cough . >>Increase lasix to 60mg  twice daily , along with zaroxolyn one daily for 3 days   12/10  Follow up /labs  Pt returns for follow up . Last ov with increased dyspnea. Diuretics were increased to lasix 60 mg Twice daily  And zaroxlyn added x 3 days.  Since last ov feels some better. No significant changes. Has good and bad days.  Labs today show  Weight down 5 lbs .  No chest pain , n/v/d or increased edema.   1/23 The metolazone did diurese the pt ? If helped the dyspnea.  Saw Nephrology MD and said renal was ok. Since last OV: noted better dyspnea 6 weeks ago, was walking gradients and doing ok then got worse and more DOE noted.  Sats low on arrival today.  No chest pains.  If lays with head low is dyspneic. Edema was ok  Past Medical History  Diagnosis Date  . CAD (coronary artery disease)     a. myoview 1/11: lg inf fixed defect;   b. cath 1/11: 3v CAD,  c. s/p CABG 1/11 c/b acute  renal failure and AFib  . Atrial fibrillation   . Chronic diastolic heart failure     a. echo 4/11: EF 45-50%, mod LVH, LAE, inf and septal HK  . First degree AV block   . Orthostatic hypotension     pyridostigmine  . History of renal transplant   . DM2 (diabetes mellitus, type 2)   . Diabetic gastroparesis   . HTN (hypertension)   . HLD (hyperlipidemia)   . PAD (peripheral artery disease)     s/p R SFA-Pop atherectomy 11/11  . Prostate cancer   . ED (erectile dysfunction)   . DDD (degenerative disc disease), lumbar   . Tremor   . Obesity   . Low testosterone   . Chronic kidney disease     s/p renal transplant 2002  . Pleural effusion, right     loculated  . COPD (chronic obstructive pulmonary disease)      Family History  Problem Relation Age of Onset  . Coronary artery disease Mother   . Valvular heart disease Father   . Hepatitis      sibling  . Coronary artery disease Sister   . Heart attack Sister   . Tuberculosis Father      History   Social  History  . Marital Status: Married    Spouse Name: N/A    Number of Children: 3  . Years of Education: N/A   Occupational History  . retired Clinical research associate    Social History Main Topics  . Smoking status: Former Smoker -- 1.5 packs/day for 20 years    Types: Cigarettes    Quit date: 02/20/1978  . Smokeless tobacco: Never Used   Comment: smoked 1 ppd, stopped 1980  . Alcohol Use: No  . Drug Use: No  . Sexually Active: Not on file   Other Topics Concern  . Not on file   Social History Narrative  . No narrative on file     No Known Allergies   Outpatient Prescriptions Prior to Visit  Medication Sig Dispense Refill  . albuterol (PROVENTIL HFA;VENTOLIN HFA) 108 (90 BASE) MCG/ACT inhaler Inhale 2 puffs into the lungs every 6 (six) hours as needed for wheezing.  1 Inhaler  0  . aspirin EC 81 MG tablet Take 1 tablet (81 mg total) by mouth daily.  150 tablet  2  . atorvastatin (LIPITOR) 40 MG tablet Take 40 mg by mouth  daily.        . carbidopa-levodopa (SINEMET CR) 25-100 MG per tablet Take 1 tablet by mouth 3 (three) times daily.        . Cholecalciferol (VITAMIN D3) 1000 UNITS CAPS Take 10,000 mg by mouth. Three times a week.      . clopidogrel (PLAVIX) 75 MG tablet TAKE ONE (1) TABLET(S) ONCE DAILY  30 tablet  0  . ezetimibe (ZETIA) 10 MG tablet Take 10 mg by mouth daily.        . Ferrous Sulfate (IRON) 325 (65 FE) MG TABS Take by mouth daily.        . Fluticasone-Salmeterol (ADVAIR DISKUS) 250-50 MCG/DOSE AEPB Inhale 1 puff into the lungs every 12 (twelve) hours.        . furosemide (LASIX) 40 MG tablet Take 2 tablets in the am and 1 tablet in the pm daily by mouth  90 tablet  2  . Insulin Aspart (NOVOLOG [place]) Inject into the skin. Via pump as directed        . isosorbide mononitrate (IMDUR) 60 MG 24 hr tablet Take 1 tablet (60 mg total) by mouth daily.  30 tablet  11  . LamoTRIgine (LAMICTAL ODT PO) Take 200 mg by mouth daily.       . magnesium oxide (MAG-OX) 400 MG tablet Take 800 mg by mouth 2 (two) times daily.       . mycophenolate (CELLCEPT) 500 MG tablet Take by mouth 2 (two) times daily.        Marland Kitchen oxyCODONE (OXY IR/ROXICODONE) 5 MG immediate release tablet Take 1 tablet by mouth Twice daily.      . pantoprazole (PROTONIX) 40 MG tablet Take 40 mg by mouth 2 (two) times daily.       . predniSONE (DELTASONE) 5 MG tablet Take 5 mg by mouth daily.        Marland Kitchen pyridostigmine (MESTINON) 60 MG tablet Take 60 mg by mouth 3 (three) times daily.       . saxagliptin HCl (ONGLYZA) 5 MG TABS tablet Take 5 mg by mouth daily.        . tacrolimus (PROGRAF) 5 MG capsule Take 5 mg by mouth 2 (two) times daily.        . Tamsulosin HCl (FLOMAX) 0.4 MG CAPS Take by mouth daily.        Marland Kitchen  temazepam (RESTORIL) 15 MG capsule Take 15 mg by mouth at bedtime as needed.        . tiotropium (SPIRIVA HANDIHALER) 18 MCG inhalation capsule Place 1 capsule (18 mcg total) into inhaler and inhale daily.  30 capsule  6  . Vilazodone HCl  (VIIBRYD) 40 MG TABS Take 10 mg by mouth daily. Slow taper as directed      . clobetasol (OLUX) 0.05 % topical foam as directed.      . Cholecalciferol (VITAMIN D) 1000 UNITS capsule Take 1,000 Units by mouth daily.        . furosemide (LASIX) 40 MG tablet Take 1.5 tablets (60 mg total) by mouth 2 (two) times daily. Take 2 tablets in the morning and one tablet in the afternoon  60 tablet  6     Review of Systems  Constitutional:   No  weight loss, night sweats,  Fevers, chills,  +fatigue, or  lassitude.  HEENT:   No headaches,  Difficulty swallowing,  Tooth/dental problems, or  Sore throat,                No sneezing, itching, ear ache, nasal congestion, post nasal drip,   CV:  No chest pain,  Orthopnea, PND, swelling in lower extremities,  , dizziness,     GI  No heartburn, indigestion, abdominal pain, nausea, vomiting, diarrhea, change in bowel habits, loss of appetite, bloody stools.   Resp: +shortness of breath with exertion or at rest.   No excess mucus, no productive cough,  No coughing up of blood.  No change in color of mucus.  No wheezing.  No chest wall deformity  Skin: no rash or lesions.  GU: no dysuria, change in color of urine, no urgency or frequency.  No flank pain, no hematuria   MS:  +joint pain   +chronic  back pain.  Psych:  No change in mood or affect. No depression or anxiety.  No memory loss.          Objective:   Physical Exam  Filed Vitals:   03/15/11 1535  BP: 128/66  Pulse: 66  Temp: 98.1 F (36.7 C)  TempSrc: Oral  Height: 6\' 2"  (1.88 m)  Weight: 223 lb (101.152 kg)  SpO2: 95%    Gen: Pleasant, well-nourished, in no distress,  normal affect  ENT: No lesions,  mouth clear,  oropharynx clear, no postnasal drip  Neck: ++JVD, no TMG, no carotid bruits  Lungs: No use of accessory muscles, no dullness to percussion diminshed BS in bases   Cardiovascular: RRR, heart sounds normal, no murmur or gallops, tr  peripheral edema  Abdomen:  soft and NT, no HSM,  BS normal  Musculoskeletal: No deformities, no cyanosis or clubbing  Neuro: alert, non focal  Skin: echymoses       Assessment & Plan:   COPD (chronic obstructive pulmonary disease) Severe Golds IV Copd oxygen dependent, loculated chronic pleural effusions seen as well Plan Increase oxygen use to 2Liter rest and 4Liter exertion.  We will send order to your home care company Stay on Advair/spiriva for now No other medication changes Return 6 weeks        Updated Medication List Outpatient Encounter Prescriptions as of 03/15/2011  Medication Sig Dispense Refill  . albuterol (PROVENTIL HFA;VENTOLIN HFA) 108 (90 BASE) MCG/ACT inhaler Inhale 2 puffs into the lungs every 6 (six) hours as needed for wheezing.  1 Inhaler  0  . aspirin EC 81 MG tablet Take 1  tablet (81 mg total) by mouth daily.  150 tablet  2  . atorvastatin (LIPITOR) 40 MG tablet Take 40 mg by mouth daily.        . Calcium Carb-Cholecalciferol (CALCIUM 1000 + D PO) Take 1 tablet by mouth daily.      . carbidopa-levodopa (SINEMET CR) 25-100 MG per tablet Take 1 tablet by mouth 3 (three) times daily.        . Cholecalciferol (VITAMIN D3) 1000 UNITS CAPS Take 10,000 mg by mouth. Three times a week.      . clopidogrel (PLAVIX) 75 MG tablet TAKE ONE (1) TABLET(S) ONCE DAILY  30 tablet  0  . ezetimibe (ZETIA) 10 MG tablet Take 10 mg by mouth daily.        . Ferrous Sulfate (IRON) 325 (65 FE) MG TABS Take by mouth daily.        . Fluticasone-Salmeterol (ADVAIR DISKUS) 250-50 MCG/DOSE AEPB Inhale 1 puff into the lungs every 12 (twelve) hours.        . folic acid (FOLVITE) 800 MCG tablet Take 800 mcg by mouth daily.      . furosemide (LASIX) 40 MG tablet Take 2 tablets in the am and 1 tablet in the pm daily by mouth  90 tablet  2  . Insulin Aspart (NOVOLOG [place]) Inject into the skin. Via pump as directed        . isosorbide mononitrate (IMDUR) 60 MG 24 hr tablet Take 1 tablet (60 mg total) by mouth  daily.  30 tablet  11  . LamoTRIgine (LAMICTAL ODT PO) Take 200 mg by mouth daily.       . magnesium oxide (MAG-OX) 400 MG tablet Take 800 mg by mouth 2 (two) times daily.       . Multiple Vitamin (MULTIVITAMIN) tablet Take 1 tablet by mouth daily.      . mycophenolate (CELLCEPT) 500 MG tablet Take by mouth 2 (two) times daily.        Marland Kitchen oxyCODONE (OXY IR/ROXICODONE) 5 MG immediate release tablet Take 1 tablet by mouth Twice daily.      . pantoprazole (PROTONIX) 40 MG tablet Take 40 mg by mouth 2 (two) times daily.       . predniSONE (DELTASONE) 5 MG tablet Take 5 mg by mouth daily.        Marland Kitchen pyridostigmine (MESTINON) 60 MG tablet Take 60 mg by mouth 3 (three) times daily.       . saxagliptin HCl (ONGLYZA) 5 MG TABS tablet Take 5 mg by mouth daily.        . tacrolimus (PROGRAF) 5 MG capsule Take 5 mg by mouth 2 (two) times daily.        . Tamsulosin HCl (FLOMAX) 0.4 MG CAPS Take by mouth daily.        . temazepam (RESTORIL) 15 MG capsule Take 15 mg by mouth at bedtime as needed.        . tiotropium (SPIRIVA HANDIHALER) 18 MCG inhalation capsule Place 1 capsule (18 mcg total) into inhaler and inhale daily.  30 capsule  6  . Vilazodone HCl (VIIBRYD) 40 MG TABS Take 10 mg by mouth daily. Slow taper as directed      . vitamin B-12 (CYANOCOBALAMIN) 500 MCG tablet Take 500 mcg by mouth daily.      . clobetasol (OLUX) 0.05 % topical foam as directed.      Marland Kitchen DISCONTD: Cholecalciferol (VITAMIN D) 1000 UNITS capsule Take 1,000 Units by mouth daily.        Marland Kitchen  DISCONTD: furosemide (LASIX) 40 MG tablet Take 1.5 tablets (60 mg total) by mouth 2 (two) times daily. Take 2 tablets in the morning and one tablet in the afternoon  60 tablet  6

## 2011-03-16 NOTE — Assessment & Plan Note (Signed)
Severe Golds IV Copd oxygen dependent, loculated chronic pleural effusions seen as well Plan Increase oxygen use to 2Liter rest and 4Liter exertion.  We will send order to your home care company Stay on Advair/spiriva for now No other medication changes Return 6 weeks

## 2011-03-17 ENCOUNTER — Other Ambulatory Visit: Payer: Self-pay | Admitting: Cardiology

## 2011-03-22 ENCOUNTER — Other Ambulatory Visit: Payer: Self-pay | Admitting: *Deleted

## 2011-03-22 ENCOUNTER — Telehealth: Payer: Self-pay | Admitting: Cardiology

## 2011-03-22 MED ORDER — PYRIDOSTIGMINE BROMIDE 60 MG PO TABS: 60.0000 mg | ORAL_TABLET | Freq: Three times a day (TID) | ORAL | Status: DC

## 2011-03-22 NOTE — Telephone Encounter (Signed)
Med refilled.

## 2011-03-22 NOTE — Telephone Encounter (Signed)
FU Call: Pharmacy calling wanting to clarify dosing of e-rx for mestinon. Please return pharmacy call to discuss further.

## 2011-03-22 NOTE — Telephone Encounter (Signed)
New problem Pt is taking pyridostigmine 3 times a day and he only has enough to last thru Saturday.please call to [facility]. He has appt next week but not enough pills

## 2011-03-27 ENCOUNTER — Telehealth: Payer: Self-pay | Admitting: Critical Care Medicine

## 2011-03-27 DIAGNOSIS — J449 Chronic obstructive pulmonary disease, unspecified: Secondary | ICD-10-CM

## 2011-03-27 NOTE — Telephone Encounter (Signed)
[name]/ [facility] added the following: needs last sats taken for pt- also wants an order for "refillable station" for pt's O2 concentrator. [phone]. [name]

## 2011-03-27 NOTE — Telephone Encounter (Signed)
I spoke with the pt and he states he was given a "mid-size" tank after [facility] sent an order to increase his oxygen to 2 liters at rest and 4 liters with exertion.  He states these are difficult to use and he cannot refill them so if he goes somewhere he has to take several tanks with him. He states he previously had small canisters that worked much better because he could refill them.  He is requesting to have an order sent to get this size again. I called [facility] and spoke with Elpidio Eric and advised her of the pt requests and she states according to records they delivered the pt the small canisters so she is not sure what the pt is needing. She states there is not a smaller tank that will go to 4 liters for the pt other then what he has. She states she will call the patient to see exactly what is needed. She will contact us if she needs another order.Carron Curie, CMA

## 2011-03-28 NOTE — Telephone Encounter (Signed)
John [name] from [facility] requires Rx for [employer] & O2 states faxed into [phone].  Stated pt is upset that this hasn't been done yet.  John [name] stated that she may be hard to reach today to due to anticipated call volume, but if we could fax the above & then let her know when this has been done to please leave a detailed message for her at [phone].  [name]

## 2011-03-28 NOTE — Telephone Encounter (Signed)
Spoke with Dois Davenport. She states that since we increased o2 to 4 lpm she needs the qualifying sats and also needs order for 1 fill system so that pt can refill his portable concentrator that goes up to 4 lpm. Both requests were faxed to her attn per her request. Nothing further needed.

## 2011-03-29 ENCOUNTER — Ambulatory Visit: Payer: Medicare Other | Admitting: Cardiology

## 2011-03-31 ENCOUNTER — Telehealth: Payer: Self-pay | Admitting: Critical Care Medicine

## 2011-03-31 NOTE — Telephone Encounter (Signed)
Ok will review when I receive

## 2011-03-31 NOTE — Telephone Encounter (Signed)
Called and spoke with pt and he is aware that message will be forwarded to PW to make him aware that pt is interested in using another company other than [facility].  They will be sending out information to PW.

## 2011-04-07 ENCOUNTER — Other Ambulatory Visit: Payer: Self-pay | Admitting: Neurological Surgery

## 2011-04-07 DIAGNOSIS — M48061 Spinal stenosis, lumbar region without neurogenic claudication: Secondary | ICD-10-CM

## 2011-04-10 ENCOUNTER — Telehealth: Payer: Self-pay | Admitting: Critical Care Medicine

## 2011-04-10 NOTE — Telephone Encounter (Signed)
[name], have you seen a CMN on this? Please advise thanks! 

## 2011-04-11 ENCOUNTER — Telehealth: Payer: Self-pay | Admitting: Critical Care Medicine

## 2011-04-11 NOTE — Telephone Encounter (Signed)
Called, spoke with Roshel.  She would like to know if we received CMN.  Per previous phone message from yesterday, pt had called regarding same issue.  Per Blinda Leatherwood, this is in Dr. Lynelle Doctor folder to be signed.  I informed Roshel of this and advised he will be back in the office on Thursday.  She verbalized understanding of this and voiced no further questions/concerns at this time.

## 2011-04-11 NOTE — Telephone Encounter (Signed)
We do have cmn from [facility] for 02, it is in  Dr Delford Field" green folder" look at waiting for sig, once signed I will fax .Kandice Hams

## 2011-04-12 NOTE — Telephone Encounter (Signed)
cmn signed and faxed to " [facility]".  Pt was seen 04/17/11 by Dr Delford Field qualifying sats were done, faxed to [facility] Att; Roshel fax #[phone].

## 2011-04-13 ENCOUNTER — Ambulatory Visit
Admission: RE | Admit: 2011-04-13 | Discharge: 2011-04-13 | Disposition: A | Discharge status: Home / Self Care | Payer: Medicare Other | Source: Ambulatory Visit | Attending: Neurological Surgery | Admitting: Neurological Surgery

## 2011-04-13 ENCOUNTER — Other Ambulatory Visit: Payer: Self-pay | Admitting: Neurological Surgery

## 2011-04-13 ENCOUNTER — Telehealth: Payer: Self-pay | Admitting: Critical Care Medicine

## 2011-04-13 DIAGNOSIS — M48061 Spinal stenosis, lumbar region without neurogenic claudication: Secondary | ICD-10-CM

## 2011-04-13 NOTE — Telephone Encounter (Signed)
I spoke with roshel ( from [facility]) and she states that medicare will not pay for pt's portable oxygen based on 01/24/11 office visit they received. She stated this is bc he was not in a "stable" condition when he was seen. She states he will have to have a 3 point test to be able to qualify for this and will need to have a RA sat. Per TD we can bring pt in for OV on PW schedule and can double him.  LMOMTCB x1 for pt

## 2011-04-14 ENCOUNTER — Other Ambulatory Visit: Payer: Self-pay | Admitting: Cardiology

## 2011-04-14 NOTE — Telephone Encounter (Signed)
Returning call can be reached at [phone].Raylene Everts

## 2011-04-14 NOTE — Telephone Encounter (Signed)
Spoke with pt and explained that we will need to bring him back for appt to re certify for o2 since [facility] requires this. Pt verbalized understanding and appt with PW scheduled for 04/17/11 at 1:30 pm.

## 2011-04-14 NOTE — Telephone Encounter (Signed)
John [name], does pt know this has been taken care of?  If so, do you need this phone message for anything else?  Pls advise. Thanks!

## 2011-04-17 ENCOUNTER — Encounter: Payer: Self-pay | Admitting: Critical Care Medicine

## 2011-04-17 ENCOUNTER — Ambulatory Visit (INDEPENDENT_AMBULATORY_CARE_PROVIDER_SITE_OTHER): Payer: Medicare Other | Admitting: Critical Care Medicine

## 2011-04-17 VITALS — BP 140/72 | HR 98 | Temp 97.6°F | Ht 74.0 in | Wt 224.6 lb

## 2011-04-17 DIAGNOSIS — J449 Chronic obstructive pulmonary disease, unspecified: Secondary | ICD-10-CM

## 2011-04-17 NOTE — Patient Instructions (Addendum)
No change in medications. Return in        3 months We will process the [facility] request 2Liter rest 4Liter exertion

## 2011-04-17 NOTE — Progress Notes (Signed)
Subjective:    Patient ID: John [name], male    DOB: [birth year], 71 y.o.   MRN: [id number]  HPI 71 y.o.  with history of renal transplant on chronic immunosuppressants, DM-insulin dependent, HTN, , Chronic back pain and CAD s/p CABG . Patient had a long and complicated hospital admission in 1/11 s/p CABG x 5.   Post-op course was complicated by pulmonary edema requiring intubation, renal failure requiring CVVH, and atrial fibrillation. He was in the hospital about 7 weeks. In 11/11, patient had right SFA and popliteal atherectomy that was complicated by a groin pseudoaneurysm. Hx of orthostatic hypotension, ShyDrager, and severe tremor followed by D[facility]meier      2/25 Since last OV.  After on oxygen all night,  Will awaken at night or in the AM, gets dyspneic when back to bed.  Cannot climb stairs without oxygen, only using 2L with exertion. Occ will have a cough esp if tries to swallow a pill.  If drinks rapidly then will cough.   CAT 26   Past Medical History  Diagnosis Date  . CAD (coronary artery disease)     a. myoview 1/11: lg inf fixed defect;   b. cath 1/11: 3v CAD,  c. s/p CABG 1/11 c/b acute renal failure and AFib  . Atrial fibrillation   . Chronic diastolic heart failure     a. echo 4/11: EF 45-50%, mod LVH, LAE, inf and septal HK  . First degree AV block   . Orthostatic hypotension     pyridostigmine  . History of renal transplant   . DM2 (diabetes mellitus, type 2)   . Diabetic gastroparesis   . HTN (hypertension)   . HLD (hyperlipidemia)   . PAD (peripheral artery disease)     s/p R SFA-Pop atherectomy 11/11  . Prostate cancer   . ED (erectile dysfunction)   . DDD (degenerative disc disease), lumbar   . Tremor   . Obesity   . Low testosterone   . Chronic kidney disease     s/p renal transplant 2002  . Pleural effusion, right     loculated  . COPD (chronic obstructive pulmonary disease)      Family History  Problem Relation Age of Onset  . Coronary  artery disease Mother   . Valvular heart disease Father   . Hepatitis      sibling  . Coronary artery disease Sister   . Heart attack Sister   . Tuberculosis Father      History   Social History  . Marital Status: Married    Spouse Name: N/A    Number of Children: 3  . Years of Education: N/A   Occupational History  . retired Clinical research associate    Social History Main Topics  . Smoking status: Former Smoker -- 2.0 packs/day for 15 years    Types: Cigarettes    Quit date: 02/20/1978  . Smokeless tobacco: Never Used   Comment: smoked 1 ppd, stopped 1980  . Alcohol Use: No  . Drug Use: No  . Sexually Active: Not on file   Other Topics Concern  . Not on file   Social History Narrative  . No narrative on file     No Known Allergies   Outpatient Prescriptions Prior to Visit  Medication Sig Dispense Refill  . albuterol (PROVENTIL HFA;VENTOLIN HFA) 108 (90 BASE) MCG/ACT inhaler Inhale 2 puffs into the lungs every 6 (six) hours as needed for wheezing.  1 Inhaler  0  . aspirin  EC 81 MG tablet Take 1 tablet (81 mg total) by mouth daily.  150 tablet  2  . atorvastatin (LIPITOR) 40 MG tablet Take 40 mg by mouth daily.        . Calcium Carb-Cholecalciferol (CALCIUM 1000 + D PO) Take 1 tablet by mouth daily.      . carbidopa-levodopa (SINEMET CR) 25-100 MG per tablet Take 1 tablet by mouth 3 (three) times daily.        . Cholecalciferol (VITAMIN D3) 1000 UNITS CAPS Take 10,000 mg by mouth. Three times a week.      . clobetasol (OLUX) 0.05 % topical foam as directed.      . clopidogrel (PLAVIX) 75 MG tablet TAKE ONE (1) TABLET(S) ONCE DAILY  30 tablet  5  . ezetimibe (ZETIA) 10 MG tablet Take 10 mg by mouth daily.        . Ferrous Sulfate (IRON) 325 (65 FE) MG TABS Take by mouth daily.        . Fluticasone-Salmeterol (ADVAIR DISKUS) 250-50 MCG/DOSE AEPB Inhale 1 puff into the lungs every 12 (twelve) hours.        . folic acid (FOLVITE) 800 MCG tablet Take 800 mcg by mouth daily.      .  furosemide (LASIX) 40 MG tablet Take 2 tablets in the am and 1 tablet in the pm daily by mouth  90 tablet  2  . Insulin Aspart (NOVOLOG [place]) Inject into the skin. Via pump as directed        . isosorbide mononitrate (IMDUR) 60 MG 24 hr tablet Take 1 tablet (60 mg total) by mouth daily.  30 tablet  11  . LamoTRIgine (LAMICTAL ODT PO) Take 200 mg by mouth daily.       . magnesium oxide (MAG-OX) 400 MG tablet Take 800 mg by mouth 2 (two) times daily.       . Multiple Vitamin (MULTIVITAMIN) tablet Take 1 tablet by mouth daily.      . mycophenolate (CELLCEPT) 500 MG tablet Take by mouth 2 (two) times daily.        Marland Kitchen oxyCODONE (OXY IR/ROXICODONE) 5 MG immediate release tablet Take 1 tablet by mouth 2 (two) times daily as needed.       . pantoprazole (PROTONIX) 40 MG tablet Take 40 mg by mouth 2 (two) times daily.       . predniSONE (DELTASONE) 5 MG tablet Take 5 mg by mouth daily.        Marland Kitchen pyridostigmine (MESTINON) 60 MG tablet Take 1 tablet (60 mg total) by mouth 3 (three) times daily.  30 tablet  0  . saxagliptin HCl (ONGLYZA) 5 MG TABS tablet Take 5 mg by mouth daily.        . tacrolimus (PROGRAF) 5 MG capsule Take 5 mg by mouth 2 (two) times daily.        . Tamsulosin HCl (FLOMAX) 0.4 MG CAPS Take by mouth daily.        . temazepam (RESTORIL) 15 MG capsule Take 15 mg by mouth at bedtime as needed.        . tiotropium (SPIRIVA HANDIHALER) 18 MCG inhalation capsule Place 1 capsule (18 mcg total) into inhaler and inhale daily.  30 capsule  6  . Vilazodone HCl (VIIBRYD) 40 MG TABS Take 20 mg by mouth daily. As directed      . vitamin B-12 (CYANOCOBALAMIN) 500 MCG tablet Take 500 mcg by mouth daily.  Review of Systems  Constitutional:   No  weight loss, night sweats,  Fevers, chills,  +fatigue, or  lassitude.  HEENT:   No headaches,  Difficulty swallowing,  Tooth/dental problems, or  Sore throat,                No sneezing, itching, ear ache, nasal congestion, post nasal drip,   CV:  No  chest pain,  Orthopnea, PND, swelling in lower extremities,  , dizziness,     GI  No heartburn, indigestion, abdominal pain, nausea, vomiting, diarrhea, change in bowel habits, loss of appetite, bloody stools.   Resp: +shortness of breath with exertion or at rest.   No excess mucus, no productive cough,  No coughing up of blood.  No change in color of mucus.  No wheezing.  No chest wall deformity  Skin: no rash or lesions.  GU: no dysuria, change in color of urine, no urgency or frequency.  No flank pain, no hematuria   MS:  +joint pain   +chronic  back pain.  Psych:  No change in mood or affect. No depression or anxiety.  No memory loss.          Objective:   Physical Exam  Filed Vitals:   04/17/11 1342 04/17/11 1346  BP: 140/72   Pulse: 98   Temp: 97.6 F (36.4 C)   TempSrc: Oral   Height: 6\' 2"  (1.88 m)   Weight: 224 lb 9.6 oz (101.878 kg)   SpO2: 82% 91%    Gen: Pleasant, well-nourished, in no distress,  normal affect  ENT: No lesions,  mouth clear,  oropharynx clear, no postnasal drip  Neck: ++JVD, no TMG, no carotid bruits  Lungs: No use of accessory muscles, no dullness to percussion diminshed BS in bases   Cardiovascular: RRR, heart sounds normal, no murmur or gallops, tr  peripheral edema  Abdomen: soft and NT, no HSM,  BS normal  Musculoskeletal: No deformities, no cyanosis or clubbing  Neuro: alert, non focal  Skin: echymoses    feV1 35%  Fef 25 75 29%  07/2010    Assessment & Plan:   COPD (chronic obstructive pulmonary disease) 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  albuterol (PROVENTIL HFA;VENTOLIN HFA) 108 (90 BASE) MCG/ACT inhaler Inhale 2 puffs into the lungs every 6 (six) hours as needed for wheezing.  1 Inhaler  0  . aspirin EC 81 MG tablet Take 1 tablet (81 mg total) by mouth daily.  150 tablet  2  . atorvastatin (LIPITOR) 40 MG tablet Take 40 mg by mouth daily.        . Calcium Carb-Cholecalciferol (CALCIUM 1000 + D PO) Take 1 tablet by mouth daily.      . carbidopa-levodopa (SINEMET CR) 25-100 MG per tablet Take 1 tablet by mouth 3 (three) times daily.        . Cholecalciferol (VITAMIN D3) 1000 UNITS CAPS Take 10,000 mg by mouth. Three times a week.      . clobetasol (OLUX) 0.05 % topical foam as directed.      . clopidogrel (PLAVIX) 75 MG tablet TAKE ONE (1) TABLET(S) ONCE  DAILY  30 tablet  5  . ezetimibe (ZETIA) 10 MG tablet Take 10 mg by mouth daily.        . Ferrous Sulfate (IRON) 325 (65 FE) MG TABS Take by mouth daily.        . Fluticasone-Salmeterol (ADVAIR DISKUS) 250-50 MCG/DOSE AEPB Inhale 1 puff into the lungs every 12 (twelve) hours.        . folic acid (FOLVITE) 800 MCG tablet Take 800 mcg by mouth daily.      . furosemide (LASIX) 40 MG tablet Take 2 tablets in the am and 1 tablet in the pm daily by mouth  90 tablet  2  . Insulin Aspart (NOVOLOG [place]) Inject into the skin. Via pump as directed        . isosorbide mononitrate (IMDUR) 60 MG 24 hr tablet Take 1 tablet (60 mg total) by mouth daily.  30 tablet  11  . LamoTRIgine (LAMICTAL ODT PO) Take 200 mg by mouth daily.       . magnesium oxide (MAG-OX) 400 MG tablet Take 800 mg by mouth 2 (two) times daily.       . Multiple Vitamin (MULTIVITAMIN) tablet Take 1 tablet by mouth daily.      . mycophenolate (CELLCEPT) 500 MG tablet Take by mouth 2 (two) times daily.        Marland Kitchen oxyCODONE (OXY IR/ROXICODONE) 5 MG immediate release tablet Take 1 tablet by mouth 2 (two) times daily as needed.       . pantoprazole (PROTONIX) 40 MG tablet Take 40 mg by mouth 2 (two) times daily.       . predniSONE  (DELTASONE) 5 MG tablet Take 5 mg by mouth daily.        Marland Kitchen pyridostigmine (MESTINON) 60 MG tablet Take 1 tablet (60 mg total) by mouth 3 (three) times daily.  30 tablet  0  . risperiDONE (RISPERDAL) 2 MG tablet Take 1 tablet by mouth Daily.      . saxagliptin HCl (ONGLYZA) 5 MG TABS tablet Take 5 mg by mouth daily.        . tacrolimus (PROGRAF) 5 MG capsule Take 5 mg by mouth 2 (two) times daily.        . Tamsulosin HCl (FLOMAX) 0.4 MG CAPS Take by mouth daily.        . temazepam (RESTORIL) 15 MG capsule Take 15 mg by mouth at bedtime as needed.        . tiotropium (SPIRIVA HANDIHALER) 18 MCG inhalation capsule Place 1 capsule (18 mcg total) into inhaler and inhale daily.  30 capsule  6  . Vilazodone HCl (VIIBRYD) 40 MG TABS Take 20 mg by mouth daily. As directed      . vitamin B-12 (CYANOCOBALAMIN) 500 MCG tablet Take 500 mcg by mouth daily.

## 2011-04-18 NOTE — Assessment & Plan Note (Addendum)
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

## 2011-04-18 NOTE — Telephone Encounter (Signed)
Spoke to [facility] they say concentrator was shipped to pt on 04/13/11 if pt does not have in afew days he will call us back Humberto Seals

## 2011-04-21 ENCOUNTER — Encounter: Payer: Self-pay | Admitting: Cardiology

## 2011-04-21 ENCOUNTER — Ambulatory Visit (INDEPENDENT_AMBULATORY_CARE_PROVIDER_SITE_OTHER): Payer: Medicare Other | Admitting: Cardiology

## 2011-04-21 DIAGNOSIS — I951 Orthostatic hypotension: Secondary | ICD-10-CM

## 2011-04-21 DIAGNOSIS — I509 Heart failure, unspecified: Secondary | ICD-10-CM

## 2011-04-21 DIAGNOSIS — J4489 Other specified chronic obstructive pulmonary disease: Secondary | ICD-10-CM

## 2011-04-21 DIAGNOSIS — J449 Chronic obstructive pulmonary disease, unspecified: Secondary | ICD-10-CM

## 2011-04-21 DIAGNOSIS — I25810 Atherosclerosis of coronary artery bypass graft(s) without angina pectoris: Secondary | ICD-10-CM

## 2011-04-21 DIAGNOSIS — M549 Dorsalgia, unspecified: Secondary | ICD-10-CM

## 2011-04-21 DIAGNOSIS — I5032 Chronic diastolic (congestive) heart failure: Secondary | ICD-10-CM

## 2011-04-21 DIAGNOSIS — I7389 Other specified peripheral vascular diseases: Secondary | ICD-10-CM

## 2011-04-21 NOTE — Patient Instructions (Signed)
Your physician recommends that you schedule a follow-up appointment in: 4 months with Dr McLean.  

## 2011-04-23 DIAGNOSIS — M549 Dorsalgia, unspecified: Secondary | ICD-10-CM | POA: Insufficient documentation

## 2011-04-23 NOTE — Assessment & Plan Note (Signed)
Continue pyridostigmine. Will let supine BP run a bit higher than usual goal given very symptomatic orthostasis on even low doses of  BP-active meds.

## 2011-04-23 NOTE — Progress Notes (Signed)
Patient ID: John [name], male   DOB: 11-Jan-1941, 71 y.o.   MRN: [id number] PCP: Dr. [name]  71 yo with history of renal transplant, DM, HTN, and CAD s/p CABG returns for followup.  Patient had a long and complicated hospital admission in 1/11.  Patient was admitted to [facility] at that time with a UTI and pulmonary edema.  Troponin was noted to be elevated and the patient had a nuclear scan with a large fixed inferior defect.  He ended up having a cardiac catheterization showing severe disease involving the LM, LAD, ramus, CFX and RCA.  He had CABG x 5.  Post-op course was complicated by pulmonary edema requiring intubation, renal failure requiring CVVH, and atrial fibrillation.  He was in the hospital about 7 weeks.  In 11/11, patient had right SFA and popliteal atherectomy that was complicated by a left groin pseudoaneurysm.   Patient developed increased dyspnea in 2012.  Lexiscan myoview in 5/12 showed a partially reversible inferior defect.  Echo 6/12 showed preserved EF of 60%, moderate LVH, normal RV, and very mild AS.  I increased his Lasix, with some weight loss.  He saw Dr. Delford Field and had right-sided thoracentesis with removal of 500 cc of fluid.  Cytology was negative for malignancy.  In-office FEV1 was quite low (35% predicted) suggesting significant COPD, so he was started on inhalers.  Given recurrence of orthostatic hypotension symptoms, I also increased his pyridostigmine.   Last fall, John [name] reported worsening orthostatic-type lightheadedness and worsening exertional dyspnea.  I decided at that point to take him for right and left heart cath in 10/12.  On Lasix, his right and left heart filling pressures were not significantly elevated.  His bypass grafts were patent but he had an ungrafted PDA with 95% stenosis (small vessel that would not be amenable to PCI).  As this was a possible source of ischemia and could potentially cause exertional dyspnea (his anginal equivalent was dyspnea in  the past), I started him on Imdur 30 mg daily.   I also took him off lisinopril due to worsening orthostatic symptoms.    He continues on Lasix 80 qam, 40 qpm.  Stable dyspnea.  He says that it waxes and wanes with no real pattern.  He has been wearing oxygen with exertion.  Main problem now continues to be low back pain and sciatica.  This has been extremely limiting; his back pain prevents walking more than short distances.  Orthostatic symptoms are better off lisinopril.  He saw Dr. Danielle Dess and has been considering back surgery.  He has gained about 10 lbs since I last saw him, which he attributes to lack of exercise.   SBP running in the 130s at home.   Labs (9/11): LDL 71, HDL 45 Labs (12/11): K 4.9, creatinine 0.9, HCT 37.1  Labs (5/12): K 4.9, creatinine 0.9, BNP 157, HCT 36.4 Labs (5/12): LDL 42, HDL 44, K 3.7, creatinine 1.1, BNP 105 Labs (6/12): K 5, creatinine 1.1, TSH normal Labs (10/12): LDL 53, HDL 47, K 4.3, creatinine 4.09, BNP 197 Labs (12/12): K 4.1, creatinine 1.3 Labs (1/13): K 3.6, creatinine 0.96  Allergies (verified):  No Known Drug Allergies  Past Medical History: 1. Diabetes mellitus 2. CKD s/p renal transplant 2002 3. HTN 4. PAD: Recent atherectomy right SFA and right popliteal on 01/12/10.  Complicated by left groin pseudoaneurysm.  5. Lumbar disc disease with low back pain 6. Diabetic gastroparesis 7. Prostate cancer s/p seed implantation in 1/06.  8. Erectile dysfunction 9. Tremor 10.  Obesity 11.  Diastolic CHF: Last echo (6/12) showed EF 60%, moderate LV hypertrophy, very mild AS, normal RV size and systolic function. 12.  CAD: Lexiscan myoview (2/11) with EF 44%,  fixed inferior defect.  Cath (2/11) with 60% distal left main, 90% ostial LAD involving D1, 95% ostial large ramus, probable significant ostial CFX stenosis, 90% PDA, 70% PLV.  Paitent had CABG with LIMA-LAD, SVG-D, sequential SVG-ramus and OM2, SVG-PLV.  Lexiscan myoview (5/12) with EF 59%,  partially reversible inferior defect.  LHC/RHC (10/12): RA 6, PA 38/12, PCWP 9, CI 2.8; SVG-OM and ramus patent, LIMA-LAD patent, SVG-D1 patent with 60% stenosis at touchdown, SVG-PLV patent; there was a 95% PDA stenosis (seen on study prior to CABG) that was ungrafted.  The PDA was a small caliber vessel and not well-suited to PCI.  This could be a source of exertional symptoms.   13.  Atrial fibrillation post-op CABG 14.  Shy-Drager syndrome with orthostatic hypotension:  improvement with pyridostigmine.  15.  Low testosterone 16.  Loculated right>left pleural effusion: Right thoracentesis (6/12), cytology negative for malignancy.  17.  COPD: Suspected.  FEV1 35% (in-office test).   18.  Carotid dopplers (6/12): 0-39% bilateral ICA stenosis.  19.  Depression 20.  Lumbar disc disease 21.  Heart block: Patient has had profound 1st degree AV block on ECG.  Holter (11/12) showed NSR, 1st degree heart block, occasional short runs of type I 2nd degree heart block.   Family History: Noncontributory  Social History: Married, retired Clinical research associate, lives in [place].  Prior smoker.  Review of Systems        All systems reviewed and negative except as per HPI.   Current Outpatient Prescriptions  Medication Sig Dispense Refill  . albuterol (PROVENTIL HFA;VENTOLIN HFA) 108 (90 BASE) MCG/ACT inhaler Inhale 2 puffs into the lungs every 6 (six) hours as needed for wheezing.  1 Inhaler  0  . aspirin EC 81 MG tablet Take 1 tablet (81 mg total) by mouth daily.  150 tablet  2  . atorvastatin (LIPITOR) 40 MG tablet Take 40 mg by mouth daily.        . Calcium Carb-Cholecalciferol (CALCIUM 1000 + D PO) Take 1 tablet by mouth daily.      . carbidopa-levodopa (SINEMET CR) 25-100 MG per tablet Take 1 tablet by mouth 3 (three) times daily.        . Cholecalciferol (VITAMIN D3) 1000 UNITS CAPS Take 10,000 mg by mouth. Three times a week.      . clobetasol (OLUX) 0.05 % topical foam as directed.      . clopidogrel  (PLAVIX) 75 MG tablet TAKE ONE (1) TABLET(S) ONCE DAILY  30 tablet  5  . ezetimibe (ZETIA) 10 MG tablet Take 10 mg by mouth daily.        . Ferrous Sulfate (IRON) 325 (65 FE) MG TABS Take by mouth daily.        . Fluticasone-Salmeterol (ADVAIR DISKUS) 250-50 MCG/DOSE AEPB Inhale 1 puff into the lungs every 12 (twelve) hours.        . folic acid (FOLVITE) 800 MCG tablet Take 800 mcg by mouth daily.      . furosemide (LASIX) 40 MG tablet Take 2 tablets in the am and 1 tablet in the pm daily by mouth  90 tablet  2  . Insulin Aspart (NOVOLOG [place]) Inject into the skin. Via pump as directed        .  isosorbide mononitrate (IMDUR) 60 MG 24 hr tablet Take 1 tablet (60 mg total) by mouth daily.  30 tablet  11  . LamoTRIgine (LAMICTAL ODT PO) Take 200 mg by mouth daily.       . magnesium oxide (MAG-OX) 400 MG tablet Take 800 mg by mouth 2 (two) times daily.       . Multiple Vitamin (MULTIVITAMIN) tablet Take 1 tablet by mouth daily.      . mycophenolate (CELLCEPT) 500 MG tablet Take by mouth 2 (two) times daily.        Marland Kitchen oxyCODONE (OXY IR/ROXICODONE) 5 MG immediate release tablet Take 1 tablet by mouth 2 (two) times daily as needed.       . pantoprazole (PROTONIX) 40 MG tablet Take 40 mg by mouth 2 (two) times daily.       . predniSONE (DELTASONE) 5 MG tablet Take 5 mg by mouth daily.        Marland Kitchen pyridostigmine (MESTINON) 60 MG tablet Take 1 tablet (60 mg total) by mouth 3 (three) times daily.  30 tablet  0  . risperiDONE (RISPERDAL) 2 MG tablet Take 1 tablet by mouth Daily.      . saxagliptin HCl (ONGLYZA) 5 MG TABS tablet Take 5 mg by mouth daily.        . tacrolimus (PROGRAF) 5 MG capsule Take 5 mg by mouth 2 (two) times daily.        . Tamsulosin HCl (FLOMAX) 0.4 MG CAPS Take by mouth daily.        . temazepam (RESTORIL) 15 MG capsule Take 15 mg by mouth at bedtime as needed.        . tiotropium (SPIRIVA HANDIHALER) 18 MCG inhalation capsule Place 1 capsule (18 mcg total) into inhaler and inhale daily.   30 capsule  6  . Vilazodone HCl (VIIBRYD) 40 MG TABS Take 20 mg by mouth daily. As directed      . vitamin B-12 (CYANOCOBALAMIN) 500 MCG tablet Take 500 mcg by mouth daily.        BP 160/78  Pulse 77  Ht 6\' 2"  (1.88 m)  Wt 227 lb (102.967 kg)  BMI 29.15 kg/m2  SpO2 91% General: NAD, overweight Neck: No JVD, no thyromegaly or thyroid nodule.  Lungs: Distant breath sounds bilaterally. CV: Nondisplaced PMI.  Heart regular S1/S2, no S3/S4, 2/6 early SEM RUSB.  Trace ankle edema.  Normal pedal pulses.  Right carotid bruit.  Abdomen: Soft, nontender, no hepatosplenomegaly, no distention.  Neurologic: Alert and oriented x 3.  Psych: Depressed affect Extremities: No clubbing or cyanosis.

## 2011-04-23 NOTE — Assessment & Plan Note (Signed)
John [name] has extremely limiting back pain.  He has been seeing Dr. Danielle Dess and is considering an operation.  He does not know if general anesthesia is required.  From a cardiac standpoint, I think that he could tolerate general anesthesia.  However, I would be very concerned for his ability to tolerate the post-op period from a pulmonary standpoint.  I told him that he needs to contact Dr. Danielle Dess to find out exactly what an operation would involve and Dr. Delford Field to get an idea of his pulmonary risk.

## 2011-04-23 NOTE — Assessment & Plan Note (Signed)
[name] does not appear volume overloaded on exam.  Filling pressures were near-normal on recent RHC.  I would continue the current dose of Lasix.  No metolazone at this point.  Breathing is back to baseline.

## 2011-04-23 NOTE — Assessment & Plan Note (Signed)
Per Dr. McAlhany.  No claudication.  

## 2011-04-23 NOTE — Assessment & Plan Note (Signed)
This plays a significant role in his exertional dyspnea.  He has home oxygen.  

## 2011-04-23 NOTE — Assessment & Plan Note (Signed)
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  This a small caliber vessel and not amenable to intervention.  His prior anginal equivalent has been dyspnea.  I think that some portion of his exertional dypsnea may be explainable by ischemia.  He had some improvement after starting Imdur.  He will also continue ASA 81, statin, Plavix.  No beta blocker or ACEI due to orthostatic hypotension.

## 2011-04-25 IMAGING — CR DG CHEST 1V PORT
1 series · 1 of 1 positions shown · non-contrast
Comparison: 1 day prior

CLINICAL DATA: Pyelonephritis.  Dyspnea.

PORTABLE CHEST - 1 VIEW

[view not recorded]
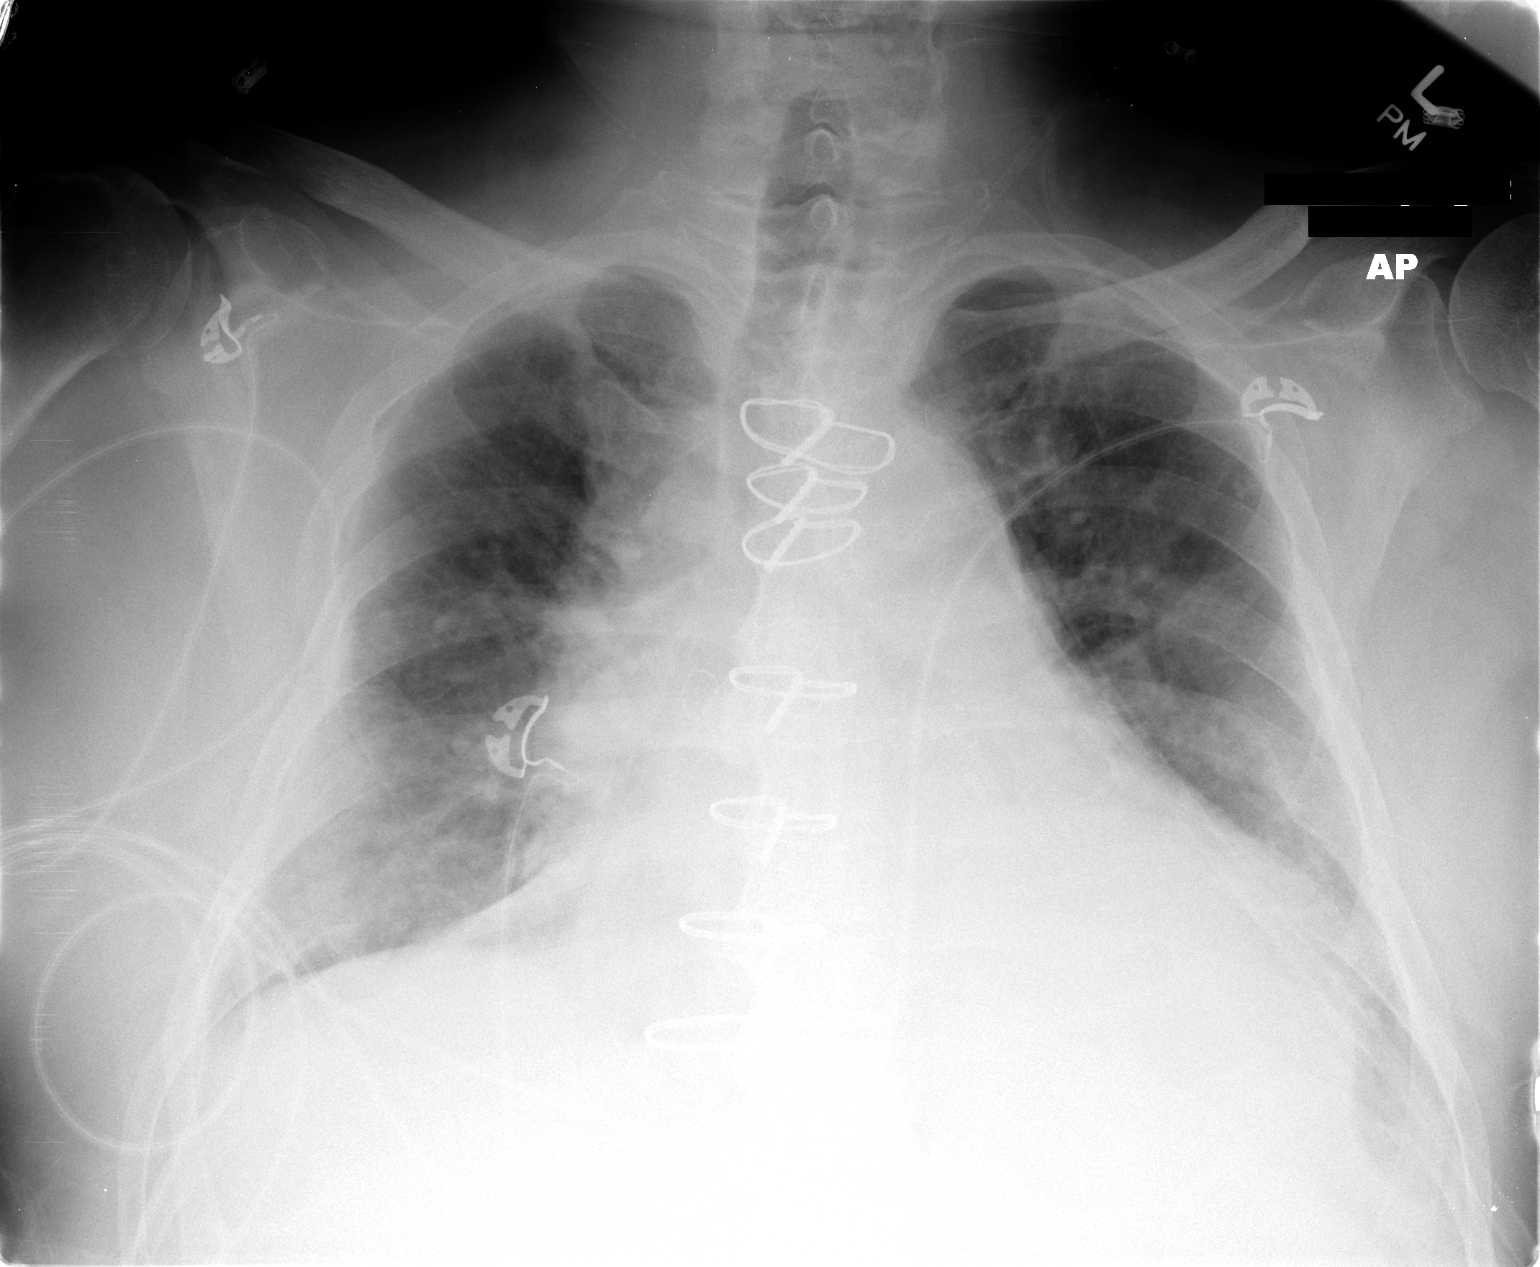

[1 of 1 positions shown; findings below may reference images not displayed]

FINDINGS: 6442 hours.  Interval removal of right IJ central line.
Prior median sternotomy. Remote left-sided rib trauma. Midline
trachea.  Moderate cardiomegaly.  Probable small left pleural
effusion. No pneumothorax.  Slight worsening in aeration.  Increase
in mild interstitial edema.  Worsened right and similar left lower
lobe airspace disease.
IMPRESSION: 1.  Slight worsening in aeration with increased interstitial edema
and right base air space disease.
2.  Persistent left lower lobe atelectasis or infection with
probable small left pleural effusion.

## 2011-04-27 ENCOUNTER — Telehealth: Payer: Self-pay | Admitting: Critical Care Medicine

## 2011-04-27 NOTE — Telephone Encounter (Signed)
I spoke to the pt.  He will need an OV soon to discuss the surgical risks

## 2011-04-27 NOTE — Telephone Encounter (Signed)
PEW not in office this week.  Called left detailed message on machine informing patient of this and that PEW will return to office next week.  Advised pt to call back if anything further is needed.

## 2011-04-27 NOTE — Telephone Encounter (Signed)
LMTCB

## 2011-04-28 ENCOUNTER — Telehealth: Payer: Self-pay | Admitting: Critical Care Medicine

## 2011-04-28 NOTE — Telephone Encounter (Signed)
Pt will come in on Tuesday 05-02-11 at 930 with PW.

## 2011-05-02 ENCOUNTER — Ambulatory Visit: Payer: Medicare Other | Admitting: Critical Care Medicine

## 2011-05-02 ENCOUNTER — Telehealth: Payer: Self-pay | Admitting: Cardiology

## 2011-05-02 ENCOUNTER — Ambulatory Visit (INDEPENDENT_AMBULATORY_CARE_PROVIDER_SITE_OTHER): Payer: Medicare Other | Admitting: Critical Care Medicine

## 2011-05-02 ENCOUNTER — Encounter: Payer: Self-pay | Admitting: Critical Care Medicine

## 2011-05-02 ENCOUNTER — Other Ambulatory Visit: Payer: Self-pay | Admitting: Neurological Surgery

## 2011-05-02 DIAGNOSIS — J449 Chronic obstructive pulmonary disease, unspecified: Secondary | ICD-10-CM

## 2011-05-02 NOTE — Telephone Encounter (Signed)
Pt is wondering if he should be off the Plavix prior to the surgery.  I told him the need to be off Plavix and  the number of days needs to be determined by his surgeon and then Dr Shirlee Latch can give the ok or not after he knows how many days the surgeon is requiring.

## 2011-05-02 NOTE — Patient Instructions (Signed)
No change in medications. Return in       3 months You are cleared for surgery.

## 2011-05-02 NOTE — Telephone Encounter (Signed)
New msg Pt's wife called about his meds and surgery scheduled. Please call her back

## 2011-05-02 NOTE — Telephone Encounter (Signed)
Should be off Plavix 5 days prior to surgery.  Please notify cardiology when he is going to undergo surgery.

## 2011-05-02 NOTE — Telephone Encounter (Signed)
NA. No voicemail.  

## 2011-05-02 NOTE — Progress Notes (Signed)
Subjective:    Patient ID: John [name], male    DOB: Jun 29, 1940, 71 y.o.   MRN: [id number]  HPI 71 y.o.  with history of renal transplant on chronic immunosuppressants, DM-insulin dependent, HTN, , Chronic back pain and CAD s/p CABG . Patient had a long and complicated hospital admission in 1/11 s/p CABG x 5.   Post-op course was complicated by pulmonary edema requiring intubation, renal failure requiring CVVH, and atrial fibrillation. He was in the hospital about 7 weeks. In 11/11, patient had right SFA and popliteal atherectomy that was complicated by a groin pseudoaneurysm. Hx of orthostatic hypotension, ShyDrager, and severe tremor followed by Dohmeier      2/25 Since last OV.  After on oxygen all night,  Will awaken at night or in the AM, gets dyspneic when back to bed.  Cannot climb stairs without oxygen, only using 2L with exertion. Occ will have a cough esp if tries to swallow a pill.  If drinks rapidly then will cough.   CAT 26   05/02/2011 Pt needs back surgery. Dyspnea is the same.  Will have occ periods of dyspnea. C/o nasal congestion. Pt denies any significant sore throat, nasal congestion or excess secretions, fever, chills, sweats, unintended weight loss, pleurtic or exertional chest pain, orthopnea PND, or leg swelling Pt denies any increase in rescue therapy over baseline, denies waking up needing it or having any early am or nocturnal exacerbations of coughing/wheezing/or dyspnea. Pt also denies any obvious fluctuation in symptoms with  weather or environmental change or other alleviating or aggravating factors    Past Medical History  Diagnosis Date  . CAD (coronary artery disease)     a. myoview 1/11: lg inf fixed defect;   b. cath 1/11: 3v CAD,  c. s/p CABG 1/11 c/b acute renal failure and AFib  . Atrial fibrillation   . Chronic diastolic heart failure     a. echo 4/11: EF 45-50%, mod LVH, LAE, inf and septal HK  . First degree AV block   . Orthostatic  hypotension     pyridostigmine  . History of renal transplant   . DM2 (diabetes mellitus, type 2)   . Diabetic gastroparesis   . HTN (hypertension)   . HLD (hyperlipidemia)   . PAD (peripheral artery disease)     s/p R SFA-Pop atherectomy 11/11  . Prostate cancer   . ED (erectile dysfunction)   . DDD (degenerative disc disease), lumbar   . Tremor   . Obesity   . Low testosterone   . Chronic kidney disease     s/p renal transplant 2002  . Pleural effusion, right     loculated  . COPD (chronic obstructive pulmonary disease)      Family History  Problem Relation Age of Onset  . Coronary artery disease Mother   . Valvular heart disease Father   . Hepatitis      sibling  . Coronary artery disease Sister   . Heart attack Sister   . Tuberculosis Father      History   Social History  . Marital Status: Married    Spouse Name: N/A    Number of Children: 3  . Years of Education: N/A   Occupational History  . retired Clinical research associate    Social History Main Topics  . Smoking status: Former Smoker -- 2.0 packs/day for 20 years    Types: Cigarettes    Quit date: 02/20/1978  . Smokeless tobacco: Never Used   Comment: smoked  1 ppd, stopped 1980  . Alcohol Use: No  . Drug Use: No  . Sexually Active: Not on file   Other Topics Concern  . Not on file   Social History Narrative  . No narrative on file     No Known Allergies   Outpatient Prescriptions Prior to Visit  Medication Sig Dispense Refill  . albuterol (PROVENTIL HFA;VENTOLIN HFA) 108 (90 BASE) MCG/ACT inhaler Inhale 2 puffs into the lungs every 6 (six) hours as needed for wheezing.  1 Inhaler  0  . aspirin EC 81 MG tablet Take 1 tablet (81 mg total) by mouth daily.  150 tablet  2  . atorvastatin (LIPITOR) 40 MG tablet Take 40 mg by mouth daily.        . Calcium Carb-Cholecalciferol (CALCIUM 1000 + D PO) Take 1 tablet by mouth daily.      . carbidopa-levodopa (SINEMET CR) 25-100 MG per tablet Take 1 tablet by mouth 3  (three) times daily.        . Cholecalciferol (VITAMIN D3) 1000 UNITS CAPS Take 10,000 mg by mouth. Three times a week.      . clobetasol (OLUX) 0.05 % topical foam as directed.      . clopidogrel (PLAVIX) 75 MG tablet TAKE ONE (1) TABLET(S) ONCE DAILY  30 tablet  5  . ezetimibe (ZETIA) 10 MG tablet Take 10 mg by mouth daily.        . Ferrous Sulfate (IRON) 325 (65 FE) MG TABS Take by mouth daily.        . Fluticasone-Salmeterol (ADVAIR DISKUS) 250-50 MCG/DOSE AEPB Inhale 1 puff into the lungs every 12 (twelve) hours.        . folic acid (FOLVITE) 800 MCG tablet Take 800 mcg by mouth daily.      . furosemide (LASIX) 40 MG tablet Take 2 tablets in the am and 1 tablet in the pm daily by mouth  90 tablet  2  . Insulin Aspart (NOVOLOG [place]) Inject into the skin. Via pump as directed        . isosorbide mononitrate (IMDUR) 60 MG 24 hr tablet Take 1 tablet (60 mg total) by mouth daily.  30 tablet  11  . LamoTRIgine (LAMICTAL ODT PO) Take 200 mg by mouth daily.       . magnesium oxide (MAG-OX) 400 MG tablet Take 800 mg by mouth 2 (two) times daily.       . Multiple Vitamin (MULTIVITAMIN) tablet Take 1 tablet by mouth daily.      . mycophenolate (CELLCEPT) 500 MG tablet Take by mouth 2 (two) times daily.        Marland Kitchen oxyCODONE (OXY IR/ROXICODONE) 5 MG immediate release tablet Take 1 tablet by mouth 2 (two) times daily as needed.       . pantoprazole (PROTONIX) 40 MG tablet Take 40 mg by mouth 2 (two) times daily.       . predniSONE (DELTASONE) 5 MG tablet Take 5 mg by mouth daily.        Marland Kitchen pyridostigmine (MESTINON) 60 MG tablet Take 1 tablet (60 mg total) by mouth 3 (three) times daily.  30 tablet  0  . risperiDONE (RISPERDAL) 2 MG tablet Take 1 tablet by mouth Daily.      . saxagliptin HCl (ONGLYZA) 5 MG TABS tablet Take 5 mg by mouth daily.        . tacrolimus (PROGRAF) 5 MG capsule Take 5 mg by mouth 2 (two) times daily.        Marland Kitchen  Tamsulosin HCl (FLOMAX) 0.4 MG CAPS Take by mouth daily.        .  temazepam (RESTORIL) 15 MG capsule Take 15 mg by mouth at bedtime as needed.        . tiotropium (SPIRIVA HANDIHALER) 18 MCG inhalation capsule Place 1 capsule (18 mcg total) into inhaler and inhale daily.  30 capsule  6  . Vilazodone HCl (VIIBRYD) 40 MG TABS Take 20 mg by mouth daily. As directed      . vitamin B-12 (CYANOCOBALAMIN) 500 MCG tablet Take 500 mcg by mouth daily.         Review of Systems  Constitutional:   No  weight loss, night sweats,  Fevers, chills,  +fatigue, or  lassitude.  HEENT:   No headaches,  Difficulty swallowing,  Tooth/dental problems, or  Sore throat,                No sneezing, itching, ear ache, nasal congestion, post nasal drip,   CV:  No chest pain,  Orthopnea, PND, swelling in lower extremities,  , dizziness,     GI  No heartburn, indigestion, abdominal pain, nausea, vomiting, diarrhea, change in bowel habits, loss of appetite, bloody stools.   Resp: +shortness of breath with exertion or at rest.   No excess mucus, no productive cough,  No coughing up of blood.  No change in color of mucus.  No wheezing.  No chest wall deformity  Skin: no rash or lesions.  GU: no dysuria, change in color of urine, no urgency or frequency.  No flank pain, no hematuria   MS:  +joint pain   +chronic  back pain.  Psych:  No change in mood or affect. No depression or anxiety.  No memory loss.          Objective:   Physical Exam  Filed Vitals:   05/02/11 0948  BP: 134/64  Pulse: 83  Temp: 98.3 F (36.8 C)  TempSrc: Oral  Height: 6\' 2"  (1.88 m)  SpO2: 94%    Gen: Pleasant, well-nourished, in no distress,  normal affect  ENT: No lesions,  mouth clear,  oropharynx clear, no postnasal drip  Neck: no JVD, no TMG, no carotid bruits  Lungs: No use of accessory muscles, no dullness to percussion diminshed BS in bases   Cardiovascular: RRR, heart sounds normal, no murmur or gallops, no peripheral edema  Abdomen: soft and NT, no HSM,  BS  normal  Musculoskeletal: No deformities, no cyanosis or clubbing  Neuro: alert, non focal  Skin: echymoses    feV1 35%  Fef 25 75 29%  07/2010    Assessment & Plan:   COPD (chronic obstructive pulmonary disease)  08/25/10 >> See recent ONO (08/18/10 -media section in chart review in EPIC)  on RA Sat 82% >20 episodes per hour desat. Pt will need repeat ONO on 2L After oxygen set up .  Severe Gold D COPD with chronic hypoxemic resp failure feV1 35%  Fef 25 75 29%  07/2010 Chronic bilateral pleural effusions with ATX LLs  While this patient has chronic respiratory failure and associated GOLD  severe D. COPD  patient is not a prohibitive risk for planned single level lumbar surgery Plan Continued inhaled medications as prescribed The patient is cleared for planned lumbar surgery Pulmonary needs to be notified when the patient is admitted for surgery       Updated Medication List Outpatient Encounter Prescriptions as of 05/02/2011  Medication Sig Dispense Refill  .  albuterol (PROVENTIL HFA;VENTOLIN HFA) 108 (90 BASE) MCG/ACT inhaler Inhale 2 puffs into the lungs every 6 (six) hours as needed for wheezing.  1 Inhaler  0  . aspirin EC 81 MG tablet Take 1 tablet (81 mg total) by mouth daily.  150 tablet  2  . atorvastatin (LIPITOR) 40 MG tablet Take 40 mg by mouth daily.        . Calcium Carb-Cholecalciferol (CALCIUM 1000 + D PO) Take 1 tablet by mouth daily.      . carbidopa-levodopa (SINEMET CR) 25-100 MG per tablet Take 1 tablet by mouth 3 (three) times daily.        . Cholecalciferol (VITAMIN D3) 1000 UNITS CAPS Take 10,000 mg by mouth. Three times a week.      . clobetasol (OLUX) 0.05 % topical foam as directed.      . clopidogrel (PLAVIX) 75 MG tablet TAKE ONE (1) TABLET(S) ONCE DAILY  30 tablet  5  . ezetimibe (ZETIA) 10 MG tablet Take 10 mg by mouth daily.        . Ferrous Sulfate (IRON) 325 (65 FE) MG TABS Take by mouth daily.        . Fluticasone-Salmeterol (ADVAIR DISKUS)  250-50 MCG/DOSE AEPB Inhale 1 puff into the lungs every 12 (twelve) hours.        . folic acid (FOLVITE) 800 MCG tablet Take 800 mcg by mouth daily.      . furosemide (LASIX) 40 MG tablet Take 2 tablets in the am and 1 tablet in the pm daily by mouth  90 tablet  2  . Insulin Aspart (NOVOLOG [place]) Inject into the skin. Via pump as directed        . isosorbide mononitrate (IMDUR) 60 MG 24 hr tablet Take 1 tablet (60 mg total) by mouth daily.  30 tablet  11  . LamoTRIgine (LAMICTAL ODT PO) Take 200 mg by mouth daily.       . magnesium oxide (MAG-OX) 400 MG tablet Take 800 mg by mouth 2 (two) times daily.       . Multiple Vitamin (MULTIVITAMIN) tablet Take 1 tablet by mouth daily.      . mycophenolate (CELLCEPT) 500 MG tablet Take by mouth 2 (two) times daily.        Marland Kitchen oxyCODONE (OXY IR/ROXICODONE) 5 MG immediate release tablet Take 1 tablet by mouth 2 (two) times daily as needed.       . pantoprazole (PROTONIX) 40 MG tablet Take 40 mg by mouth 2 (two) times daily.       . predniSONE (DELTASONE) 5 MG tablet Take 5 mg by mouth daily.        Marland Kitchen pyridostigmine (MESTINON) 60 MG tablet Take 1 tablet (60 mg total) by mouth 3 (three) times daily.  30 tablet  0  . risperiDONE (RISPERDAL) 2 MG tablet Take 1 tablet by mouth Daily.      . saxagliptin HCl (ONGLYZA) 5 MG TABS tablet Take 5 mg by mouth daily.        . tacrolimus (PROGRAF) 5 MG capsule Take 5 mg by mouth 2 (two) times daily.        . Tamsulosin HCl (FLOMAX) 0.4 MG CAPS Take by mouth daily.        . temazepam (RESTORIL) 15 MG capsule Take 15 mg by mouth at bedtime as needed.        . tiotropium (SPIRIVA HANDIHALER) 18 MCG inhalation capsule Place 1 capsule (18 mcg total) into inhaler and  inhale daily.  30 capsule  6  . Vilazodone HCl (VIIBRYD) 40 MG TABS Take 20 mg by mouth daily. As directed      . vitamin B-12 (CYANOCOBALAMIN) 500 MCG tablet Take 500 mcg by mouth daily.

## 2011-05-02 NOTE — Assessment & Plan Note (Signed)
08/25/10 >> See recent ONO (08/18/10 -media section in chart review in EPIC)  on RA Sat 82% >20 episodes per hour desat. Pt will need repeat ONO on 2L After oxygen set up .  Severe Gold D COPD with chronic hypoxemic resp failure feV1 35%  Fef 25 75 29%  07/2010 Chronic bilateral pleural effusions with ATX LLs  While this patient has chronic respiratory failure and associated GOLD  severe D. COPD  patient is not a prohibitive risk for planned single level lumbar surgery Plan Continued inhaled medications as prescribed The patient is cleared for planned lumbar surgery Pulmonary needs to be notified when the patient is admitted for surgery

## 2011-05-02 NOTE — Telephone Encounter (Signed)
New problem:  Per Shanda Bumps, can patient be clear for back surgery on 3/26 with Dr. Barnett Abu.

## 2011-05-02 NOTE — Telephone Encounter (Signed)
Contacted John [name] and read Dr Alford Highland last ov note.  She states they have a copy of this and no more is required unless Dr Shirlee Latch has something more to add.  I will forward this note to Dr Shirlee Latch in case he needs to add more information.  Pt has been cleared by his pulmonologist already.

## 2011-05-03 MED ORDER — PYRIDOSTIGMINE BROMIDE 60 MG PO TABS: ORAL_TABLET | ORAL | Status: DC

## 2011-05-03 NOTE — Telephone Encounter (Signed)
I will forward this information to Dr Danielle Dess.

## 2011-05-03 NOTE — Telephone Encounter (Signed)
Talked with pt's wife. She is aware pt should hold Plavix for 5 days prior to surgery.

## 2011-05-05 ENCOUNTER — Encounter (HOSPITAL_COMMUNITY): Payer: Self-pay | Admitting: Pharmacy Technician

## 2011-05-10 ENCOUNTER — Other Ambulatory Visit: Payer: Self-pay

## 2011-05-10 ENCOUNTER — Encounter (HOSPITAL_COMMUNITY): Payer: Self-pay

## 2011-05-10 ENCOUNTER — Encounter (HOSPITAL_COMMUNITY)
Admission: RE | Admit: 2011-05-10 | Discharge: 2011-05-10 | Disposition: A | Discharge status: Home / Self Care | Payer: Medicare Other | Source: Ambulatory Visit | Attending: Neurological Surgery | Admitting: Neurological Surgery

## 2011-05-10 ENCOUNTER — Encounter (HOSPITAL_COMMUNITY)
Admission: RE | Admit: 2011-05-10 | Discharge: 2011-05-10 | Disposition: A | Discharge status: Home / Self Care | Payer: Medicare Other | Source: Ambulatory Visit | Attending: Anesthesiology | Admitting: Anesthesiology

## 2011-05-10 HISTORY — DX: Anxiety disorder, unspecified: F41.9

## 2011-05-10 LAB — CBC
MCHC: 30.9 g/dL (ref 30.0–36.0)
MCV: 71.7 fL — ABNORMAL LOW (ref 78.0–100.0)
Platelets: 160 10*3/uL (ref 150–400)
RDW: 18.5 % — ABNORMAL HIGH (ref 11.5–15.5)
WBC: 7.3 10*3/uL (ref 4.0–10.5)

## 2011-05-10 LAB — BASIC METABOLIC PANEL
BUN: 19 mg/dL (ref 6–23)
CO2: 30 mEq/L (ref 19–32)
Calcium: 9.4 mg/dL (ref 8.4–10.5)
Creatinine, Ser: 1.21 mg/dL (ref 0.50–1.35)
GFR calc Af Amer: 68 mL/min — ABNORMAL LOW (ref >90)

## 2011-05-10 NOTE — Pre-Procedure Instructions (Signed)
[name]  05/10/2011   Your procedure is scheduled on:  05/16/11  Report to [facility] at 530 AM.  Call this number if you have problems the morning of surgery: [phone]   Remember:   Do not eat food:After Midnight.  May have clear liquids: up to 4 Hours before arrival.  Clear liquids include soda, tea, black coffee, apple or grape juice, broth.  Take these medicines the morning of surgery with A SIP OF WATER: all inhalers,sinemet,cellcept,oxycodone,protonix,prograft,mestinon, flomax,   Do not wear jewelry, make-up or nail polish.  Do not wear lotions, powders, or perfumes. You may wear deodorant.  Do not shave 48 hours prior to surgery.  Do not bring valuables to the hospital.  Contacts, dentures or bridgework may not be worn into surgery.  Leave suitcase in the car. After surgery it may be brought to your room.  For patients admitted to the hospital, checkout time is 11:00 AM the day of discharge.   Patients discharged the day of surgery willnot be allowed to drive home.  Name and phone number of your driver: **wife  Special Instructions: CHG Shower Use Special Wash: 1/2 bottle night before surgery and 1/2 bottle morning of surgery.   Please read over the following fact sheets that you were given: Pain Booklet, Coughing and Deep Breathing, MRSA Information and Surgical Site Infection Prevention

## 2011-05-10 NOTE — Progress Notes (Signed)
2 d echo in epic  6/12.   Cardiac notes are in epic.    Stress test done in 2012

## 2011-05-11 NOTE — Consult Note (Signed)
Anesthesia:  Patient is a 71 year old male scheduled for bilateral L5-S1 laminectomy on 05/16/11.  History includes former smoker, CAD s/p CABG '11, afib, chronic diastolic HF, first degree AVB with occasional runs of second degree type I AVB, HTN and history of orthostatic hypotension, DM2, PAD s/p right SFA and popliteal atherectomy 11/11, ED, tremor, anxiety, CKD with history of renal transplant on immunosuprressants, HLD, prostate CA, obesity (but now with BMI of 29), COPD, history of right pleural effusion.  His Cardiologist is Dr. Shirlee Latch who is aware of the planned procedure and has communicated to both Dr.Elsner's office and with the patient (see Note tab).    In January 2011 he was admitted with a UTI, pulmonary edema, elevated troponin.  He ultimately required a CABG X 5 with complicated post-op course including acute respiratory failure secondary to pulmonary edema, acute renal failure requiring CVVH, afib, and was hospitalized about 7 weeks.  He developed dyspnea in mid 2012.  Myoview on 03/09/09 showed low probability for pulmonary embolism. Small subsegmental  perfusion defect in the left apex.  Echo was done of 07/27/10 that showed: Left ventricle: The cavity size was normal. Wall thickness was increased in a pattern of moderate LVH. Systolic function was normal. The estimated ejection fraction was 55% to 60%. Aortic valve: Heavily calcified leaflet tips There was very mild Stenosis. Left atrium: The atrium was mildly dilated. Atrial septum: No defect or patent foramen ovale was identified.  His EKG on 05/10/11 showed SR with first degree AVB, LAFB, non-specific ST abnormality, later ST changes.  I don't think it is significantly changed from his EKG on 12/02/10.  He later saw Dr. Delford Field and required a right thoracentesis and was diagnosed with significant COPD with a FEV1 of 35% predicted.  He was prescribed supplemental O2 with exertion.  In late 2012 he developed orthostatic hypotension and a  cardiac cath was repeated on 12/07/10 which showed patent grafts but and ungrafted PDA with 95% stenosis not felt amenable to PCI.  Lisinopril was discontinued and Imdur was initiated.     He was also cleared by Dr. Delford Field at his last office visit on 05/02/11.  Below are his assessment and recommendations:  "08/25/10 >> See recent ONO (08/18/10 -media section in chart review in EPIC) on RA Sat 82% >20 episodes per hour desat. Pt will need repeat ONO on 2L After oxygen set up .  Severe Gold D COPD with chronic hypoxemic resp failure feV1 35% Fef 25 75 29% 07/2010  Chronic bilateral pleural effusions with ATX LLs  While this patient has chronic respiratory failure and associated GOLD severe D. COPD patient is not a prohibitive risk for planned single level lumbar surgery  Plan: Continued inhaled medications as prescribed  The patient is cleared for planned lumbar surgery  Pulmonary needs to be notified when the patient is admitted for surgery."  His CXR on 05/10/11 showed: Chronic loculated pleural effusions greater on the right. Chronic basilar predominant pulmonary opacity. No superimposed acute findings are identified.  Labs noted.  H/H 9.9/32.0.  Will order a T&S for the day of surgery.  His Cr is stable at 1.21.  His assigned Anesthesiologist will likely want him to have an ABG pre-operatively.  Timing of this can be verified once the patient arrives (Short Stay versus Holding, per his assigned Anesthesiologist.)

## 2011-05-15 ENCOUNTER — Telehealth: Payer: Self-pay | Admitting: Critical Care Medicine

## 2011-05-15 ENCOUNTER — Telehealth: Payer: Self-pay | Admitting: Cardiology

## 2011-05-15 MED ORDER — CEFAZOLIN SODIUM-DEXTROSE 2-3 GM-% IV SOLR
2.0000 g | INTRAVENOUS | Status: AC
  Administered 2011-05-16: 2 g via INTRAVENOUS
  Filled 2011-05-15: qty 50

## 2011-05-15 NOTE — Telephone Encounter (Signed)
Dr.McLean aware.  °

## 2011-05-15 NOTE — Telephone Encounter (Signed)
Find out correct date.  There is no such date as 3/32/13

## 2011-05-15 NOTE — Telephone Encounter (Signed)
Noted  

## 2011-05-15 NOTE — Telephone Encounter (Signed)
Will forward to PW as FYI 

## 2011-05-15 NOTE — Telephone Encounter (Signed)
New problem:  Per Shanda Bumps calling at patient request to let Dr. Shirlee Latch know, his surgery on 3/26 @ 7:30 with Dr. Barnett Abu @  [facility].

## 2011-05-15 NOTE — Telephone Encounter (Signed)
Called Dr. Verlee Rossetti office, spoke with Velna Hatchet who states surgery is scheduled for 05/16/11 at 7:30 am.  Will forward msg to PW so he is aware.

## 2011-05-16 ENCOUNTER — Encounter (HOSPITAL_COMMUNITY): Payer: Self-pay | Admitting: *Deleted

## 2011-05-16 ENCOUNTER — Inpatient Hospital Stay (HOSPITAL_COMMUNITY)
Admission: RE | Admit: 2011-05-16 | Discharge: 2011-06-08 | DRG: 490 | Disposition: A | Discharge status: Rehabilitation Facility | Payer: Medicare Other | Source: Ambulatory Visit | Attending: Critical Care Medicine | Admitting: Critical Care Medicine

## 2011-05-16 ENCOUNTER — Inpatient Hospital Stay (HOSPITAL_COMMUNITY): Payer: Medicare Other

## 2011-05-16 ENCOUNTER — Encounter (HOSPITAL_COMMUNITY): Payer: Self-pay | Admitting: Vascular Surgery

## 2011-05-16 ENCOUNTER — Inpatient Hospital Stay (HOSPITAL_COMMUNITY): Payer: Medicare Other | Admitting: Vascular Surgery

## 2011-05-16 ENCOUNTER — Encounter (HOSPITAL_COMMUNITY)
Admission: RE | Disposition: A | Discharge status: Rehabilitation Facility | Payer: Self-pay | Source: Ambulatory Visit | Attending: Neurological Surgery

## 2011-05-16 DIAGNOSIS — K3184 Gastroparesis: Secondary | ICD-10-CM | POA: Diagnosis present

## 2011-05-16 DIAGNOSIS — E1149 Type 2 diabetes mellitus with other diabetic neurological complication: Secondary | ICD-10-CM | POA: Diagnosis present

## 2011-05-16 DIAGNOSIS — R9430 Abnormal result of cardiovascular function study, unspecified: Secondary | ICD-10-CM

## 2011-05-16 DIAGNOSIS — M79609 Pain in unspecified limb: Secondary | ICD-10-CM

## 2011-05-16 DIAGNOSIS — I509 Heart failure, unspecified: Secondary | ICD-10-CM | POA: Diagnosis present

## 2011-05-16 DIAGNOSIS — I440 Atrioventricular block, first degree: Secondary | ICD-10-CM

## 2011-05-16 DIAGNOSIS — R4182 Altered mental status, unspecified: Secondary | ICD-10-CM

## 2011-05-16 DIAGNOSIS — M549 Dorsalgia, unspecified: Secondary | ICD-10-CM

## 2011-05-16 DIAGNOSIS — Z87891 Personal history of nicotine dependence: Secondary | ICD-10-CM

## 2011-05-16 DIAGNOSIS — R0600 Dyspnea, unspecified: Secondary | ICD-10-CM

## 2011-05-16 DIAGNOSIS — F329 Major depressive disorder, single episode, unspecified: Secondary | ICD-10-CM | POA: Diagnosis present

## 2011-05-16 DIAGNOSIS — R0902 Hypoxemia: Secondary | ICD-10-CM

## 2011-05-16 DIAGNOSIS — J4489 Other specified chronic obstructive pulmonary disease: Secondary | ICD-10-CM | POA: Diagnosis present

## 2011-05-16 DIAGNOSIS — E873 Alkalosis: Secondary | ICD-10-CM | POA: Diagnosis not present

## 2011-05-16 DIAGNOSIS — F05 Delirium due to known physiological condition: Secondary | ICD-10-CM

## 2011-05-16 DIAGNOSIS — I4891 Unspecified atrial fibrillation: Secondary | ICD-10-CM

## 2011-05-16 DIAGNOSIS — E119 Type 2 diabetes mellitus without complications: Secondary | ICD-10-CM

## 2011-05-16 DIAGNOSIS — I2510 Atherosclerotic heart disease of native coronary artery without angina pectoris: Secondary | ICD-10-CM

## 2011-05-16 DIAGNOSIS — E870 Hyperosmolality and hypernatremia: Secondary | ICD-10-CM | POA: Diagnosis not present

## 2011-05-16 DIAGNOSIS — J811 Chronic pulmonary edema: Secondary | ICD-10-CM | POA: Diagnosis not present

## 2011-05-16 DIAGNOSIS — M5126 Other intervertebral disc displacement, lumbar region: Secondary | ICD-10-CM | POA: Diagnosis present

## 2011-05-16 DIAGNOSIS — K5900 Constipation, unspecified: Secondary | ICD-10-CM | POA: Diagnosis not present

## 2011-05-16 DIAGNOSIS — I5032 Chronic diastolic (congestive) heart failure: Secondary | ICD-10-CM

## 2011-05-16 DIAGNOSIS — Z940 Kidney transplant status: Secondary | ICD-10-CM

## 2011-05-16 DIAGNOSIS — Z01812 Encounter for preprocedural laboratory examination: Secondary | ICD-10-CM

## 2011-05-16 DIAGNOSIS — J449 Chronic obstructive pulmonary disease, unspecified: Secondary | ICD-10-CM

## 2011-05-16 DIAGNOSIS — G9340 Encephalopathy, unspecified: Secondary | ICD-10-CM | POA: Diagnosis not present

## 2011-05-16 DIAGNOSIS — F411 Generalized anxiety disorder: Secondary | ICD-10-CM | POA: Diagnosis present

## 2011-05-16 DIAGNOSIS — Z9981 Dependence on supplemental oxygen: Secondary | ICD-10-CM

## 2011-05-16 DIAGNOSIS — N189 Chronic kidney disease, unspecified: Secondary | ICD-10-CM

## 2011-05-16 DIAGNOSIS — F3289 Other specified depressive episodes: Secondary | ICD-10-CM | POA: Diagnosis present

## 2011-05-16 DIAGNOSIS — Z9641 Presence of insulin pump (external) (internal): Secondary | ICD-10-CM

## 2011-05-16 DIAGNOSIS — Z9911 Dependence on respirator [ventilator] status: Secondary | ICD-10-CM

## 2011-05-16 DIAGNOSIS — D72829 Elevated white blood cell count, unspecified: Secondary | ICD-10-CM | POA: Diagnosis not present

## 2011-05-16 DIAGNOSIS — I6529 Occlusion and stenosis of unspecified carotid artery: Secondary | ICD-10-CM

## 2011-05-16 DIAGNOSIS — J90 Pleural effusion, not elsewhere classified: Secondary | ICD-10-CM

## 2011-05-16 DIAGNOSIS — I25810 Atherosclerosis of coronary artery bypass graft(s) without angina pectoris: Secondary | ICD-10-CM

## 2011-05-16 DIAGNOSIS — I10 Essential (primary) hypertension: Secondary | ICD-10-CM | POA: Diagnosis present

## 2011-05-16 DIAGNOSIS — IMO0002 Reserved for concepts with insufficient information to code with codable children: Secondary | ICD-10-CM

## 2011-05-16 DIAGNOSIS — I5033 Acute on chronic diastolic (congestive) heart failure: Secondary | ICD-10-CM

## 2011-05-16 DIAGNOSIS — R5381 Other malaise: Secondary | ICD-10-CM | POA: Diagnosis not present

## 2011-05-16 DIAGNOSIS — Z951 Presence of aortocoronary bypass graft: Secondary | ICD-10-CM

## 2011-05-16 DIAGNOSIS — I7389 Other specified peripheral vascular diseases: Secondary | ICD-10-CM | POA: Diagnosis not present

## 2011-05-16 DIAGNOSIS — G238 Other specified degenerative diseases of basal ganglia: Secondary | ICD-10-CM | POA: Diagnosis present

## 2011-05-16 DIAGNOSIS — M47817 Spondylosis without myelopathy or radiculopathy, lumbosacral region: Principal | ICD-10-CM | POA: Diagnosis present

## 2011-05-16 DIAGNOSIS — J9600 Acute respiratory failure, unspecified whether with hypoxia or hypercapnia: Secondary | ICD-10-CM

## 2011-05-16 DIAGNOSIS — E876 Hypokalemia: Secondary | ICD-10-CM | POA: Diagnosis not present

## 2011-05-16 DIAGNOSIS — E785 Hyperlipidemia, unspecified: Secondary | ICD-10-CM | POA: Diagnosis present

## 2011-05-16 DIAGNOSIS — M479 Spondylosis, unspecified: Secondary | ICD-10-CM | POA: Diagnosis present

## 2011-05-16 HISTORY — PX: LUMBAR LAMINECTOMY/DECOMPRESSION MICRODISCECTOMY: SHX5026

## 2011-05-16 LAB — GLUCOSE, CAPILLARY
Glucose-Capillary: 119 mg/dL — ABNORMAL HIGH (ref 70–99)
Glucose-Capillary: 180 mg/dL — ABNORMAL HIGH (ref 70–99)

## 2011-05-16 LAB — TYPE AND SCREEN

## 2011-05-16 SURGERY — LUMBAR LAMINECTOMY/DECOMPRESSION MICRODISCECTOMY 1 LEVEL
Anesthesia: General | Site: Back | Laterality: Bilateral | Wound class: Clean

## 2011-05-16 MED ORDER — GLYCOPYRROLATE 0.2 MG/ML IJ SOLN
INTRAMUSCULAR | Status: DC | PRN
  Administered 2011-05-16: .6 mg via INTRAVENOUS

## 2011-05-16 MED ORDER — PANTOPRAZOLE SODIUM 40 MG PO TBEC
40.0000 mg | DELAYED_RELEASE_TABLET | Freq: Two times a day (BID) | ORAL | Status: DC
  Administered 2011-05-16 – 2011-05-23 (×14): 40 mg via ORAL
  Filled 2011-05-16 (×14): qty 1

## 2011-05-16 MED ORDER — SODIUM CHLORIDE 0.9 % IJ SOLN
3.0000 mL | Freq: Two times a day (BID) | INTRAMUSCULAR | Status: DC
  Administered 2011-05-16 – 2011-05-25 (×17): 3 mL via INTRAVENOUS

## 2011-05-16 MED ORDER — INSULIN PUMP
Freq: Three times a day (TID) | SUBCUTANEOUS | Status: DC
  Administered 2011-05-16 (×2): via SUBCUTANEOUS
  Administered 2011-05-17: 3 via SUBCUTANEOUS
  Administered 2011-05-17: 2 via SUBCUTANEOUS
  Administered 2011-05-17 – 2011-05-20 (×5): via SUBCUTANEOUS
  Filled 2011-05-16: qty 1

## 2011-05-16 MED ORDER — MAGNESIUM OXIDE 400 MG PO TABS
800.0000 mg | ORAL_TABLET | Freq: Two times a day (BID) | ORAL | Status: DC
  Administered 2011-05-16 – 2011-05-19 (×7): 800 mg via ORAL
  Filled 2011-05-16 (×9): qty 2

## 2011-05-16 MED ORDER — PHENOL 1.4 % MT LIQD: 1.0000 | OROMUCOSAL | Status: DC | PRN

## 2011-05-16 MED ORDER — OXYCODONE-ACETAMINOPHEN 5-325 MG PO TABS
1.0000 | ORAL_TABLET | ORAL | Status: DC | PRN
  Administered 2011-05-16 – 2011-05-19 (×8): 2 via ORAL
  Filled 2011-05-16 (×8): qty 2

## 2011-05-16 MED ORDER — DEXAMETHASONE SODIUM PHOSPHATE 4 MG/ML IJ SOLN
INTRAMUSCULAR | Status: DC | PRN
  Administered 2011-05-16: 8 mg via INTRAVENOUS

## 2011-05-16 MED ORDER — ISOSORBIDE MONONITRATE ER 60 MG PO TB24
60.0000 mg | ORAL_TABLET | Freq: Every day | ORAL | Status: DC
  Administered 2011-05-16 – 2011-05-19 (×4): 60 mg via ORAL
  Filled 2011-05-16 (×5): qty 1

## 2011-05-16 MED ORDER — DEXTROSE 5 % IV SOLN
INTRAVENOUS | Status: DC | PRN
  Administered 2011-05-16: 08:00:00 via INTRAVENOUS

## 2011-05-16 MED ORDER — SODIUM CHLORIDE 0.9 % IV SOLN: 250.0000 mL | INTRAVENOUS | Status: DC

## 2011-05-16 MED ORDER — MAGNESIUM HYDROXIDE 400 MG/5ML PO SUSP
30.0000 mL | Freq: Every day | ORAL | Status: DC | PRN
  Administered 2011-05-23: 30 mL via ORAL
  Filled 2011-05-16: qty 30

## 2011-05-16 MED ORDER — ONDANSETRON HCL 4 MG/2ML IJ SOLN
4.0000 mg | INTRAMUSCULAR | Status: DC | PRN
  Administered 2011-06-02 – 2011-06-07 (×2): 4 mg via INTRAVENOUS
  Filled 2011-05-16 (×2): qty 2

## 2011-05-16 MED ORDER — SENNA 8.6 MG PO TABS
1.0000 | ORAL_TABLET | Freq: Two times a day (BID) | ORAL | Status: DC
  Administered 2011-05-17 – 2011-05-30 (×24): 8.6 mg via ORAL
  Filled 2011-05-16 (×30): qty 1

## 2011-05-16 MED ORDER — MYCOPHENOLATE MOFETIL 500 MG PO TABS: 500.0000 mg | ORAL_TABLET | Freq: Two times a day (BID) | ORAL | Status: DC

## 2011-05-16 MED ORDER — SODIUM CHLORIDE 0.9 % IV SOLN
INTRAVENOUS | Status: AC
  Filled 2011-05-16: qty 500

## 2011-05-16 MED ORDER — ACETAMINOPHEN 650 MG RE SUPP: 650.0000 mg | RECTAL | Status: DC | PRN

## 2011-05-16 MED ORDER — VILAZODONE HCL 20 MG PO TABS
20.0000 mg | ORAL_TABLET | Freq: Every day | ORAL | Status: DC
  Administered 2011-05-16 – 2011-05-19 (×4): 20 mg via ORAL
  Filled 2011-05-16 (×5): qty 1

## 2011-05-16 MED ORDER — LINAGLIPTIN 5 MG PO TABS
5.0000 mg | ORAL_TABLET | Freq: Every day | ORAL | Status: DC
  Administered 2011-05-16 – 2011-05-30 (×14): 5 mg via ORAL
  Filled 2011-05-16 (×15): qty 1

## 2011-05-16 MED ORDER — INSULIN REGULAR HUMAN 100 UNIT/ML IJ SOLN
100.0000 [IU] | INTRAMUSCULAR | Status: DC | PRN
  Administered 2011-05-16: 2 [IU]/h via INTRAVENOUS

## 2011-05-16 MED ORDER — LAMOTRIGINE 200 MG PO TABS
200.0000 mg | ORAL_TABLET | Freq: Every day | ORAL | Status: DC
  Administered 2011-05-16 – 2011-06-08 (×22): 200 mg via ORAL
  Filled 2011-05-16 (×24): qty 1

## 2011-05-16 MED ORDER — PYRIDOSTIGMINE BROMIDE 60 MG PO TABS
60.0000 mg | ORAL_TABLET | Freq: Three times a day (TID) | ORAL | Status: DC
  Administered 2011-05-16 – 2011-05-24 (×23): 60 mg via ORAL
  Filled 2011-05-16 (×30): qty 1

## 2011-05-16 MED ORDER — MORPHINE SULFATE 4 MG/ML IJ SOLN
1.0000 mg | INTRAMUSCULAR | Status: DC | PRN
  Administered 2011-05-17 – 2011-05-18 (×3): 4 mg via INTRAVENOUS
  Filled 2011-05-16 (×3): qty 1

## 2011-05-16 MED ORDER — CARBIDOPA-LEVODOPA 25-100 MG PO TBCR: 1.0000 | EXTENDED_RELEASE_TABLET | Freq: Three times a day (TID) | ORAL | Status: DC

## 2011-05-16 MED ORDER — THROMBIN 5000 UNITS EX KIT
PACK | CUTANEOUS | Status: DC | PRN
  Administered 2011-05-16 (×4): 5000 [IU] via TOPICAL

## 2011-05-16 MED ORDER — SODIUM CHLORIDE 0.9 % IJ SOLN: 3.0000 mL | INTRAMUSCULAR | Status: DC | PRN

## 2011-05-16 MED ORDER — CARBIDOPA-LEVODOPA 25-100 MG PO TABS
1.0000 | ORAL_TABLET | Freq: Three times a day (TID) | ORAL | Status: DC
  Administered 2011-05-16 – 2011-05-28 (×35): 1 via ORAL
  Filled 2011-05-16 (×42): qty 1

## 2011-05-16 MED ORDER — TAMSULOSIN HCL 0.4 MG PO CAPS
0.4000 mg | ORAL_CAPSULE | Freq: Every day | ORAL | Status: DC
  Administered 2011-05-16 – 2011-05-30 (×14): 0.4 mg via ORAL
  Filled 2011-05-16 (×15): qty 1

## 2011-05-16 MED ORDER — MENTHOL 3 MG MT LOZG: 1.0000 | LOZENGE | OROMUCOSAL | Status: DC | PRN

## 2011-05-16 MED ORDER — TIOTROPIUM BROMIDE MONOHYDRATE 18 MCG IN CAPS
18.0000 ug | ORAL_CAPSULE | Freq: Every day | RESPIRATORY_TRACT | Status: DC
  Administered 2011-05-17 – 2011-05-21 (×4): 18 ug via RESPIRATORY_TRACT
  Filled 2011-05-16: qty 5

## 2011-05-16 MED ORDER — PREDNISONE 5 MG PO TABS
5.0000 mg | ORAL_TABLET | Freq: Every day | ORAL | Status: DC
  Administered 2011-05-16 – 2011-05-19 (×4): 5 mg via ORAL
  Filled 2011-05-16 (×5): qty 1

## 2011-05-16 MED ORDER — TACROLIMUS 1 MG PO CAPS
5.0000 mg | ORAL_CAPSULE | Freq: Two times a day (BID) | ORAL | Status: DC
  Administered 2011-05-16 – 2011-05-24 (×14): 5 mg via ORAL
  Filled 2011-05-16 (×18): qty 5

## 2011-05-16 MED ORDER — VILAZODONE HCL 40 MG PO TABS: 20.0000 mg | ORAL_TABLET | Freq: Every day | ORAL | Status: DC

## 2011-05-16 MED ORDER — FENTANYL CITRATE 0.05 MG/ML IJ SOLN
INTRAMUSCULAR | Status: DC | PRN
  Administered 2011-05-16 (×3): 50 ug via INTRAVENOUS
  Administered 2011-05-16: 100 ug via INTRAVENOUS
  Administered 2011-05-16: 50 ug via INTRAVENOUS

## 2011-05-16 MED ORDER — BACITRACIN 50000 UNITS IM SOLR
INTRAMUSCULAR | Status: AC
  Filled 2011-05-16: qty 1

## 2011-05-16 MED ORDER — ALBUTEROL SULFATE HFA 108 (90 BASE) MCG/ACT IN AERS
2.0000 | INHALATION_SPRAY | Freq: Four times a day (QID) | RESPIRATORY_TRACT | Status: DC | PRN
  Administered 2011-05-18: 2 via RESPIRATORY_TRACT
  Filled 2011-05-16: qty 6.7

## 2011-05-16 MED ORDER — ONDANSETRON HCL 4 MG/2ML IJ SOLN
INTRAMUSCULAR | Status: DC | PRN
  Administered 2011-05-16: 4 mg via INTRAVENOUS

## 2011-05-16 MED ORDER — ATORVASTATIN CALCIUM 40 MG PO TABS
40.0000 mg | ORAL_TABLET | Freq: Every day | ORAL | Status: DC
  Administered 2011-05-16 – 2011-05-19 (×4): 40 mg via ORAL
  Filled 2011-05-16 (×5): qty 1

## 2011-05-16 MED ORDER — BUPIVACAINE HCL (PF) 0.5 % IJ SOLN
INTRAMUSCULAR | Status: DC | PRN
  Administered 2011-05-16: 30 mL

## 2011-05-16 MED ORDER — CALCIUM CARBONATE 1250 (500 CA) MG PO TABS
2.0000 | ORAL_TABLET | Freq: Every day | ORAL | Status: DC
  Administered 2011-05-17 – 2011-05-28 (×9): 1000 mg via ORAL
  Filled 2011-05-16 (×14): qty 2

## 2011-05-16 MED ORDER — INSULIN ASPART 100 UNIT/ML ~~LOC~~ SOLN: 0.0000 [IU] | Freq: Three times a day (TID) | SUBCUTANEOUS | Status: DC

## 2011-05-16 MED ORDER — OXYCODONE HCL 5 MG PO TABS
5.0000 mg | ORAL_TABLET | Freq: Two times a day (BID) | ORAL | Status: DC | PRN
  Administered 2011-05-16 – 2011-05-19 (×4): 5 mg via ORAL
  Filled 2011-05-16 (×4): qty 1

## 2011-05-16 MED ORDER — MYCOPHENOLATE MOFETIL 250 MG PO CAPS
500.0000 mg | ORAL_CAPSULE | Freq: Two times a day (BID) | ORAL | Status: DC
  Administered 2011-05-16 – 2011-05-24 (×14): 500 mg via ORAL
  Filled 2011-05-16 (×18): qty 2

## 2011-05-16 MED ORDER — NEOSTIGMINE METHYLSULFATE 1 MG/ML IJ SOLN
INTRAMUSCULAR | Status: DC | PRN
  Administered 2011-05-16: 5 mg via INTRAVENOUS

## 2011-05-16 MED ORDER — PROPOFOL 10 MG/ML IV EMUL
INTRAVENOUS | Status: DC | PRN
  Administered 2011-05-16: 160 mg via INTRAVENOUS

## 2011-05-16 MED ORDER — LAMOTRIGINE 25 MG PO TBDP: 5.0000 mg/kg/d | ORAL_TABLET | Freq: Every day | ORAL | Status: DC

## 2011-05-16 MED ORDER — FUROSEMIDE 40 MG PO TABS
40.0000 mg | ORAL_TABLET | Freq: Every day | ORAL | Status: DC
  Administered 2011-05-16 – 2011-05-17 (×2): 40 mg via ORAL
  Filled 2011-05-16 (×2): qty 1

## 2011-05-16 MED ORDER — LIDOCAINE-EPINEPHRINE 1 %-1:100000 IJ SOLN
INTRAMUSCULAR | Status: DC | PRN
  Administered 2011-05-16: 20 mL

## 2011-05-16 MED ORDER — SODIUM CHLORIDE 0.9 % IR SOLN
Status: DC | PRN
  Administered 2011-05-16: 10:00:00

## 2011-05-16 MED ORDER — HYDROMORPHONE HCL PF 1 MG/ML IJ SOLN
0.2500 mg | INTRAMUSCULAR | Status: DC | PRN
  Administered 2011-05-16 (×3): 0.5 mg via INTRAVENOUS

## 2011-05-16 MED ORDER — HYDROMORPHONE HCL PF 1 MG/ML IJ SOLN
INTRAMUSCULAR | Status: AC
  Filled 2011-05-16: qty 1

## 2011-05-16 MED ORDER — ACETAMINOPHEN 325 MG PO TABS
650.0000 mg | ORAL_TABLET | ORAL | Status: DC | PRN
  Administered 2011-05-23 – 2011-05-24 (×2): 650 mg via ORAL
  Filled 2011-05-16 (×2): qty 2

## 2011-05-16 MED ORDER — VECURONIUM BROMIDE 10 MG IV SOLR
INTRAVENOUS | Status: DC | PRN
  Administered 2011-05-16: 2 mg via INTRAVENOUS

## 2011-05-16 MED ORDER — EZETIMIBE 10 MG PO TABS
10.0000 mg | ORAL_TABLET | Freq: Every day | ORAL | Status: DC
  Administered 2011-05-16 – 2011-05-19 (×4): 10 mg via ORAL
  Filled 2011-05-16 (×5): qty 1

## 2011-05-16 MED ORDER — DOCUSATE SODIUM 100 MG PO CAPS
100.0000 mg | ORAL_CAPSULE | Freq: Two times a day (BID) | ORAL | Status: DC
  Administered 2011-05-17 – 2011-05-23 (×13): 100 mg via ORAL
  Filled 2011-05-16 (×16): qty 1

## 2011-05-16 MED ORDER — LIDOCAINE HCL (CARDIAC) 20 MG/ML IV SOLN
INTRAVENOUS | Status: DC | PRN
  Administered 2011-05-16: 70 mg via INTRAVENOUS

## 2011-05-16 MED ORDER — ONDANSETRON HCL 4 MG/2ML IJ SOLN: 4.0000 mg | Freq: Four times a day (QID) | INTRAMUSCULAR | Status: DC | PRN

## 2011-05-16 MED ORDER — LACTATED RINGERS IV SOLN
INTRAVENOUS | Status: DC | PRN
  Administered 2011-05-16 (×2): via INTRAVENOUS

## 2011-05-16 MED ORDER — DROPERIDOL 2.5 MG/ML IJ SOLN
INTRAMUSCULAR | Status: DC | PRN
  Administered 2011-05-16: 0.625 mg via INTRAVENOUS

## 2011-05-16 MED ORDER — RISPERIDONE 2 MG PO TABS
2.0000 mg | ORAL_TABLET | Freq: Every day | ORAL | Status: DC
  Administered 2011-05-16 – 2011-05-19 (×4): 2 mg via ORAL
  Filled 2011-05-16 (×5): qty 1

## 2011-05-16 MED ORDER — ASPIRIN EC 81 MG PO TBEC
81.0000 mg | DELAYED_RELEASE_TABLET | Freq: Every day | ORAL | Status: DC
  Administered 2011-05-16 – 2011-05-24 (×8): 81 mg via ORAL
  Filled 2011-05-16 (×9): qty 1

## 2011-05-16 MED ORDER — HEMOSTATIC AGENTS (NO CHARGE) OPTIME
TOPICAL | Status: DC | PRN
  Administered 2011-05-16 (×2): 1 via TOPICAL

## 2011-05-16 MED ORDER — EPHEDRINE SULFATE 50 MG/ML IJ SOLN
INTRAMUSCULAR | Status: DC | PRN
  Administered 2011-05-16: 10 mg via INTRAVENOUS

## 2011-05-16 MED ORDER — ROCURONIUM BROMIDE 100 MG/10ML IV SOLN
INTRAVENOUS | Status: DC | PRN
  Administered 2011-05-16: 50 mg via INTRAVENOUS

## 2011-05-16 MED ORDER — FLUTICASONE-SALMETEROL 250-50 MCG/DOSE IN AEPB
1.0000 | INHALATION_SPRAY | Freq: Two times a day (BID) | RESPIRATORY_TRACT | Status: DC
  Administered 2011-05-16 – 2011-05-21 (×9): 1 via RESPIRATORY_TRACT
  Filled 2011-05-16: qty 14

## 2011-05-16 MED ORDER — 0.9 % SODIUM CHLORIDE (POUR BTL) OPTIME
TOPICAL | Status: DC | PRN
  Administered 2011-05-16: 1000 mL

## 2011-05-16 MED ORDER — TEMAZEPAM 15 MG PO CAPS
15.0000 mg | ORAL_CAPSULE | Freq: Every evening | ORAL | Status: DC | PRN
  Administered 2011-05-17: 15 mg via ORAL
  Filled 2011-05-16: qty 1

## 2011-05-16 SURGICAL SUPPLY — 51 items
BAG DECANTER FOR FLEXI CONT (MISCELLANEOUS) ×2 IMPLANT
BLADE SURG ROTATE 9660 (MISCELLANEOUS) ×2 IMPLANT
BUR ACORN 6.0 (BURR) IMPLANT
BUR MATCHSTICK NEURO 3.0 LAGG (BURR) ×2 IMPLANT
CANISTER SUCTION 2500CC (MISCELLANEOUS) ×2 IMPLANT
CLOTH BEACON ORANGE TIMEOUT ST (SAFETY) ×2 IMPLANT
CONT SPEC 4OZ CLIKSEAL STRL BL (MISCELLANEOUS) ×2 IMPLANT
DECANTER SPIKE VIAL GLASS SM (MISCELLANEOUS) ×2 IMPLANT
DERMABOND ADVANCED (GAUZE/BANDAGES/DRESSINGS) ×1
DERMABOND ADVANCED .7 DNX12 (GAUZE/BANDAGES/DRESSINGS) ×1 IMPLANT
DRAPE LAPAROTOMY 100X72X124 (DRAPES) ×2 IMPLANT
DRAPE MICROSCOPE LEICA (MISCELLANEOUS) ×2 IMPLANT
DRAPE POUCH INSTRU U-SHP 10X18 (DRAPES) ×2 IMPLANT
DRAPE PROXIMA HALF (DRAPES) IMPLANT
DURAPREP 26ML APPLICATOR (WOUND CARE) ×2 IMPLANT
DURASEAL SPINE SEALANT 3ML (MISCELLANEOUS) ×2 IMPLANT
ELECT REM PT RETURN 9FT ADLT (ELECTROSURGICAL) ×2
ELECTRODE REM PT RTRN 9FT ADLT (ELECTROSURGICAL) ×1 IMPLANT
GAUZE SPONGE 4X4 16PLY XRAY LF (GAUZE/BANDAGES/DRESSINGS) IMPLANT
GLOVE BIOGEL PI IND STRL 7.0 (GLOVE) ×1 IMPLANT
GLOVE BIOGEL PI IND STRL 8.5 (GLOVE) ×1 IMPLANT
GLOVE BIOGEL PI INDICATOR 7.0 (GLOVE) ×1
GLOVE BIOGEL PI INDICATOR 8.5 (GLOVE) ×1
GLOVE ECLIPSE 8.5 STRL (GLOVE) ×2 IMPLANT
GLOVE EXAM NITRILE LRG STRL (GLOVE) IMPLANT
GLOVE EXAM NITRILE MD LF STRL (GLOVE) IMPLANT
GLOVE EXAM NITRILE XL STR (GLOVE) IMPLANT
GLOVE EXAM NITRILE XS STR PU (GLOVE) IMPLANT
GLOVE SURG SS PI 6.5 STRL IVOR (GLOVE) ×4 IMPLANT
GOWN BRE IMP SLV AUR LG STRL (GOWN DISPOSABLE) IMPLANT
GOWN BRE IMP SLV AUR XL STRL (GOWN DISPOSABLE) ×4 IMPLANT
GOWN STRL REIN 2XL LVL4 (GOWN DISPOSABLE) ×2 IMPLANT
KIT BASIN OR (CUSTOM PROCEDURE TRAY) ×2 IMPLANT
KIT ROOM TURNOVER OR (KITS) ×2 IMPLANT
NEEDLE HYPO 22GX1.5 SAFETY (NEEDLE) ×2 IMPLANT
NEEDLE SPNL 20GX3.5 QUINCKE YW (NEEDLE) ×2 IMPLANT
NS IRRIG 1000ML POUR BTL (IV SOLUTION) ×2 IMPLANT
PACK LAMINECTOMY NEURO (CUSTOM PROCEDURE TRAY) ×2 IMPLANT
PAD ARMBOARD 7.5X6 YLW CONV (MISCELLANEOUS) ×6 IMPLANT
PATTIES SURGICAL .5 X1 (DISPOSABLE) IMPLANT
RUBBERBAND STERILE (MISCELLANEOUS) ×4 IMPLANT
SPONGE GAUZE 4X4 12PLY (GAUZE/BANDAGES/DRESSINGS) ×2 IMPLANT
SPONGE SURGIFOAM ABS GEL SZ50 (HEMOSTASIS) ×2 IMPLANT
SUT VIC AB 1 CT1 18XBRD ANBCTR (SUTURE) ×1 IMPLANT
SUT VIC AB 1 CT1 8-18 (SUTURE) ×1
SUT VIC AB 2-0 CP2 18 (SUTURE) ×2 IMPLANT
SUT VIC AB 3-0 SH 8-18 (SUTURE) ×2 IMPLANT
SYR 20ML ECCENTRIC (SYRINGE) ×2 IMPLANT
TOWEL OR 17X24 6PK STRL BLUE (TOWEL DISPOSABLE) ×2 IMPLANT
TOWEL OR 17X26 10 PK STRL BLUE (TOWEL DISPOSABLE) ×2 IMPLANT
WATER STERILE IRR 1000ML POUR (IV SOLUTION) ×2 IMPLANT

## 2011-05-16 NOTE — H&P (Signed)
05/16/11 No changes from notes of April 20 2011. John [name]  #[id number] DOB:  1940-04-12 04/20/2011:     [name] returns to the office today.  He had an MRI of his lumbar spine to elucidate the nature of his radiculopathy he has been having mostly on the left side but also on the right side for a fairly long length of time.  He tells me that in the past he has been able to get over this process but more recently it has become more stubborn.    The MRI indicates that the patient has a large herniated disc at L5-S1 that appears chronic in nature.  That is there is calcification around the disc particularly on the left side where there is significant foraminal stenosis for the S1 nerve root below. On the right side, there is also a chronic disc herniation with some acute component on top of the disc herniation.  There is retrolisthesis of the joint at L5-S1.  The facet joints show some moderate spondylitic change. At L4-5, there is some modest disc degeneration but no evidence of nerve root compromise or significant foraminal stenosis.    In reviewing the MRI pictures and combining them with the plain x-rays that show again that he has advanced degenerative process at L5-S1, my suspicion is that this process has been longstanding in nature and that in his case simple decompression of the L5-S1 would suffice to relieve pressure on the S1 nerve root.  This should hopefully help eliminate the worst of the radicular pain that he has.    On examination, I note that [name] has centralized low back pain but this is below the L5-S1 joint and I believe that is primarily radicular in nature as opposed to pain from a degenerative joint itself.    We discussed the particulars of surgery.  I believe that he will require bilateral laminotomy and decompression of the S1 nerve roots.  We would not perform a fusion. There is some risk with that in that occasionally the joint can deteriorate, degenerate and then  loosen after surgery but my feeling is that this process has been ongoing long enough that there is some intrinsic stability in the joint itself.  The major risks of surgery include those of infection, spinal fluid leak which he is well aware of.  Any of these could cause a longer hospital stay and possibly need for further surgery.  There is also a chance of a recurrence of a disc herniation which is about 15% short term and long term.  These things not withstanding, however, given the patient's significant radicular pain, I believe that he would do well with surgical decompression at L5-S1.  We will plan the surgery at the earliest convenience.           Stefani Dama, M.D./gde NEUROSURGICAL CONSULTATION  Abir Craine  #[id number] DOB:  [birth year]  April 05, 2011  CHIEF COMPLAINT:   Ruptured disc, lumbar spine.  HISTORY OF PRESENT ILLNESS:  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  He tells me that he was seen initially and treated by Dr. Nickola Major.  He was subsequently seen by Dr. Magnus Ivan and Naaman Plummer had done approximately six epidural steroid injections.  The initial ones seemed to give him good relief but subsequently the latter did  not help much at all.  He has now been having substantial problems that have been worsening over the past number of months involving the left buttock, left hip and left leg.    He brings with him an MRI scan that we were able to trace from 2009 and this study demonstrates that the patient has essentially one level disc disease at L5-S1.  He has a large broad-based protrusion of the disc and some very minimal spondylitic changes at L4-5.  The overwhelming problem occurs at L5-S1 with marked Modic changes and substantial disc degenerative changes.  PAST MEDICAL HISTORY:  Notable for high  blood pressure and history of diabetes.  The patient did have a coronary artery bypass grafting surgery some years ago.  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  He notes that his kidney function has been good since that time.    MEDICATIONS:    He is on a number of medications including Prograf 5 mg. b.i.d., CellCept 500 mg. b.i.d., Prednisone 5 mg. q.d., NovoLog pump, Zetia 10 mg. q.d., Temazepam 15 mg. 1-2, Viibryd 10 mg., Carb/Levodopa 25-100 mg. t.i.d., Clopidogrel 75 mg. q.d., Onglyza 5 mg. q.d., Magnesium oxide 400 mg. 2 b.i.d., Pantoprazole 40 mg. b.i.d., tamsulosin 0.4 mg. q.d., Pyridostigmine 60 mg. t.i.d., Atorvastatin 40 mg. q.d., Isosorbide 60 mg. q.d., Furosemide 40 mg. 3 per day, Lamictal 200 mg. q.d., Advair Diskus 250/50 b.i.d., Tiotropium 18 mg. q.d., Oxycodone 5 mg. 1-2 as needed, and oxygen 2-3 liters at night primarily.    He takes some over-the-counter medications including vitamin D, aspirin, B12, folic acid supplementation, iron, and calcium.    ALLERGIES:    No known drug allergies.   REVIEW OF SYSTEMS:   Notable for wearing of glasses, history of cataract surgery, balance disturbance.  Leg pain while walking, shortness of breath, leg weakness, back pain, leg pain, anxiety, depression, diabetes, fainting spells and blacking out, and skin cancer noted on a 14-point review sheet in addition to history of chronic obstructive pulmonary disease.    PHYSICAL EXAMINATION:  On examination, I note that he has good strength in the major groups including the iliopsoas, and quadriceps, tibialis anterior and gastrocs all of which are graded at 4+/5.  He does note some unsteadiness on his feet which may be in relationship to some muscular instability.  IMPRESSION:    The patient has evidence of substantial degenerative changes at L5-S1.  He has a left lumbar radiculopathy that he complains of bitterly. I indicated that he should undergo a new MRI of the  lumbar spine.  If indeed the process is still at one level as it has been on that study from 2009, he may be a good candidate for some surgical intervention to decompress and stabilize this process.  Indeed it seems that the conservative effort with epidural steroids have worn their course out over the past number of years.  I would like to see him back after an MRI is completed so that we can make further recommendations regarding the specific intervention.    [facility]          Stefani Dama, M.D.

## 2011-05-16 NOTE — Consult Note (Signed)
Requesting physician: Dr. Danielle Dess  Reason for consultation: Management of Diabetes and other known medical problems   History of Present Illness: John [name] is a 71 y/o CM with Diabetes (on insulin pump at home), COPD, CAD (s/p CABG) , HPL, HTN, CKD s/p renal transplant x 2 and lumbar degenerative disc disease.  He presented to the hospital for elective surgery given his worsening radiculopathy.  Reportedly radiculopathy was so severe that John [name] could barely walk and was associated with worsening discomfort over the last several months.  John [name] know to have a large chronically herniated calcified disc protrusion which appears to compress both exiting S1 nerve roots.  After evaluation by surgeon John [name] elected to get Bilateral laminotomies and foraminotomies decompression of L5 and S1 nerve roots with operating microscope microdissection technique, microdiscectomy.  [facility] have been consult to manage his known medical problems including his diabetes.  Allergies:  No Known Allergies    Past Medical History  Diagnosis Date  . CAD (coronary artery disease)     a. myoview 1/11: lg inf fixed defect;   b. cath 1/11: 3v CAD,  c. s/p CABG 1/11 c/b acute renal failure and AFib  . Atrial fibrillation   . Chronic diastolic heart failure     a. echo 4/11: EF 45-50%, mod LVH, LAE, inf and septal HK  . First degree AV block   . Orthostatic hypotension     pyridostigmine  . History of renal transplant   . DM2 (diabetes mellitus, type 2)   . Diabetic gastroparesis   . HTN (hypertension)   . HLD (hyperlipidemia)   . PAD (peripheral artery disease)     s/p R SFA-Pop atherectomy 11/11  . Prostate cancer   . ED (erectile dysfunction)   . DDD (degenerative disc disease), lumbar   . Tremor   . Obesity   . Low testosterone   . Chronic kidney disease     s/p renal transplant 2002  . Pleural effusion, right     loculated  . COPD (chronic obstructive pulmonary disease)   . Anxiety   .  Diabetes mellitus     insulin pump    Past Surgical History  Procedure Date  . Coronary artery bypass graft 03/2009  . Cataract extraction   . Vitrectomy   . Tonsillectomy   . Kidney transplant 2002  . Hernia repair   . Prostate cancer rx 2006 brachytherapy   . Prior peritoneal catheter placement     Scheduled Meds:   . bacitracin      .  ceFAZolin (ANCEF) IV  2 g Intravenous 60 min Pre-Op  . HYDROmorphone      . sodium chloride       Continuous Infusions:  PRN Meds:.HYDROmorphone, ondansetron (ZOFRAN) IV, DISCONTD: 0.9 % irrigation (POUR BTL), DISCONTD: bacitracin irrigation, DISCONTD: bupivacaine, DISCONTD: hemostatic agents, DISCONTD: lidocaine-EPINEPHrine, DISCONTD: thrombin  Social History:  reports that he quit smoking about 33 years ago. His smoking use included Cigarettes. He has a 40 pack-year smoking history. He has never used smokeless tobacco. He reports that he does not drink alcohol or use illicit drugs.  Family History  Problem Relation Age of Onset  . Coronary artery disease Mother   . Valvular heart disease Father   . Hepatitis      sibling  . Coronary artery disease Sister   . Heart attack Sister   . Tuberculosis Father     Review of Systems:  Constitutional: Denies fever, chills, diaphoresis, appetite change and fatigue.  HEENT:  Denies photophobia, eye pain, redness, hearing loss, ear pain, congestion, sore throat, rhinorrhea, sneezing, mouth sores, trouble swallowing, neck pain, neck stiffness and tinnitus.   Respiratory: Denies SOB, DOE, cough, chest tightness,  and wheezing.   Cardiovascular: Denies chest pain, palpitations and leg swelling.  Gastrointestinal: Denies nausea, vomiting, abdominal pain, diarrhea, constipation, blood in stool and abdominal distention.  Genitourinary: Denies dysuria, urgency, frequency, hematuria, flank pain and difficulty urinating.  Musculoskeletal: Denies myalgias, back pain, joint swelling, arthralgias and gait  problem.  Skin: Denies pallor, rash and wound.  Neurological: Denies dizziness, seizures, syncope, weakness, light-headedness, numbness and headaches.  Hematological: Denies adenopathy. Easy bruising, personal or family bleeding history  Psychiatric/Behavioral: Denies suicidal ideation, mood changes, confusion, nervousness, sleep disturbance and agitation   Physical Exam: Blood pressure 104/48, pulse 80, temperature 98.2 F (36.8 C), temperature source Oral, resp. rate 23, SpO2 91.00%. General: Alert, awake, oriented x3, in no acute distress. HEENT: No bruits, no goiter. Heart: Regular rate and rhythm no rubs, gallops. Lungs: Clear to auscultation bilaterally. Abdomen: Soft, nontender, nondistended, positive bowel sounds. Extremities: No clubbing cyanosis or edema with positive pedal pulses. Neuro: John [name] was responding to questions appropriately.    Labs on Admission:  Results for orders placed during the hospital encounter of 05/16/11 (from the past 48 hour(s))  GLUCOSE, CAPILLARY     Status: Abnormal   Collection Time   05/16/11  6:22 AM      Component Value Range Comment   Glucose-Capillary 112 (*) 70 - 99 (mg/dL)   TYPE AND SCREEN     Status: Normal   Collection Time   05/16/11  6:25 AM      Component Value Range Comment   ABO/RH(D) O POS      Antibody Screen NEG      Sample Expiration 05/19/2011     GLUCOSE, CAPILLARY     Status: Abnormal   Collection Time   05/16/11 11:04 AM      Component Value Range Comment   Glucose-Capillary 119 (*) 70 - 99 (mg/dL)     Radiological Exams on Admission: Dg Lumbar Spine 1 View  05/16/2011  *RADIOLOGY REPORT*  Clinical Data: Intraoperative localization for lumbar surgery.  LUMBAR SPINE - 1 VIEW  Comparison: Lumbar spine films 04/13/2011.  Findings: A spinal needle is projecting at the L5-S1 disc space level.  IMPRESSION: L5-S1 marked intraoperatively.  Original Report Authenticated By: P. Loralie Champagne, M.D.     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 - Reportedly John [name] to get transferred to step down for closer monitoring.  Also defer pain management to primary  CAD: continue statin John [name] is s/p day 0 of surgery will hold his aspirin and plavix  DM: Place on diabetic SSI, John [name] is on insulin pump at home.  Will also add meal coverage and Qhs coverage,  Diabetic diet once able.  COPD:  At this point stable will plan on continuing home regimen. (spiriva, advair)  Diastolic CHF:  Will hold lasix currently and recommend gentle hydration if IVF required post op. Imdur post op if tolerated and continuing monitoring John [name].  HPL: Continue statin.  ESRD s/p renal transplant:  Continue home regimen. Will continue to monitor closely.   Time Spent on Consultation: 55 minutes reviewing chart, examining John [name], placing orders, documenting, updating informations systems.  Penny Pia [facility]  [phone] 05/16/2011, 1:14  PM

## 2011-05-16 NOTE — Progress Notes (Signed)
Dr. Randa Evens (anesthesiologist on call) called regarding possible need for ABG's this am. No answer.

## 2011-05-16 NOTE — Progress Notes (Signed)
Spoke with Dr. Nicholaus Corolla regarding ABG's, insulin pump. He stated to send pt to OR and he will consult with pt.

## 2011-05-16 NOTE — Transfer of Care (Signed)
Immediate Anesthesia Transfer of Care Note  Patient: John [name]  Procedure(s) Performed: Procedure(s) (LRB): LUMBAR LAMINECTOMY/DECOMPRESSION MICRODISCECTOMY 1 LEVEL (Bilateral)  Patient Location: PACU  Anesthesia Type: General  Level of Consciousness: oriented, sedated, patient cooperative and responds to stimulation  Airway & Oxygen Therapy: Patient Spontanous Breathing and Patient connected to face mask oxygen  Post-op Assessment: Report given to PACU RN, Post -op Vital signs reviewed and stable, Patient moving all extremities and Patient moving all extremities X 4  Post vital signs: Reviewed and stable  Complications: No apparent anesthesia complications

## 2011-05-16 NOTE — Anesthesia Postprocedure Evaluation (Signed)
Anesthesia Post Note  Patient: John [name]  Procedure(s) Performed: Procedure(s) (LRB): LUMBAR LAMINECTOMY/DECOMPRESSION MICRODISCECTOMY 1 LEVEL (Bilateral)  Anesthesia type: General  Patient location: PACU  Post pain: Pain level controlled and Adequate analgesia  Post assessment: Post-op Vital signs reviewed, Patient's Cardiovascular Status Stable, Respiratory Function Stable, Patent Airway and Pain level controlled  Last Vitals:  Filed Vitals:   05/16/11 1214  BP: 104/48  Pulse: 80  Temp:   Resp: 23    Post vital signs: Reviewed and stable  Level of consciousness: awake, alert  and oriented  Complications: No apparent anesthesia complications

## 2011-05-16 NOTE — Progress Notes (Signed)
Inpatient Diabetes Program Recommendations  AACE/ADA: New Consensus Statement on Inpatient Glycemic Control (2009)  Target Ranges:  Prepandial:   less than 140 mg/dL      Peak postprandial:   less than 180 mg/dL (1-2 hours)      Critically ill patients:  140 - 180 mg/dL   Reason for Visit: Consult regarding insulin pump  Note: Patient uses a Ping insulin pump.  Dr. Leslie Dales manages patient's insulin pump as an outpatient.  Basal settings are as follows: 12 midnight = 1.8 u/hr; 6 AM until 12 midnight = 2.1 u/hr for a total of 48.6 units per 24 hrs.  Target is 120 mg/dl.  CHO ratio is 1:10.  Correction factor is 40.  Patient seems very knowledgeable and comfortable regarding use of his pump.

## 2011-05-16 NOTE — Op Note (Signed)
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 Surgeon: Barnett Abu M.D. Assistant: Lelon Perla M.D. Anesthesia: Gen. endotracheal Indications: Patient is a 71 year old individual with a number of medical conditions including coronary artery disease congestive failure chronic obstructive pulmonary disease renal failure status post renal transplant x2 with severe lumbar radiculopathy such that he can barely walk he has generally a healthy back save for L5-S1 were he has a large chronically herniated calcified disc protrusion which appears to compress both exiting S1 nerve roots. These failed efforts at conservative management surgery is now been suggested.  Procedure: Patient was brought to the operating room supine on a stretcher. After the smooth induction of general endotracheal anesthesia he was turned prone onto the operating table. The back was prepped with alcohol and DuraPrep and draped in a sterile fashion. Localizing radiographs identified the interspace at a 5 S1. A midline incision was created and carried down to the lumbar dorsal fascia which was opened on either side of midline at this level. The dissection was carried out over the interlaminar space and the facet joints at L5-S1. A self-retaining retractor was placed in the wound. A high-speed drill was then used to remove the inferior margin of the lamina out to the medial wall the facet performing the initial portion of the dissection. The yellow ligament was then taken up and removed. Common dural tube was identified and dissection was carefully undertaken removing redundant yellow ligament and overgrown facet from the  superior facet of L5-S1 and the laminar arch of L5. A foraminotomy was created over the L5 and S1 nerve root.  The left S1 nerve root was noted to be severely scarred to the underlying mass which was ultimately found to be a calcified fragment of disc. This was attached to an inferior osteophyte from L5 and required the use of a small osteotome to mobilize. Manipulation of the S1 nerve root yielded a small spinal fluid leak from the undersurface of the dura. Further dissection yielded several other calcified fragments of disc. The disc space itself was noted to be very collapsed. He could not be entered on the left side.  On the right side the S1 nerve root could be mobilized and there was a significant portion of the same calcified disc on this side this was again mobilized with a small osteotome and an osteophyte toward was used to remove this in a piecemeal fashion. I gradually mobilizing the lateral portions of disc the central portion was mobilized and able to be removed from this side. In the end the path of the S1 nerve root was cleared and the area over the disc space was smooth. The disc space could be entered on this side and a discectomy was performed on the right side at L5-S1. A foraminotomy was then created on each side for the L5 nerve root. This required removing redundant ligament material and the superior articular process of the S1 facet. In the end the L5 nerve root could be sounded free and clear into the foramen.  Once an adequate decompression was identified and secured, hemostasis and the soft tissues obtained meticulously and when verified retractor was removed the wound was irrigated copiously with antibiotic irrigating solution, and then the lumbar dorsal fascia was closed with #1 Vicryl in interrupted fashion. 20 cc Of half  percent Marcaine was injected into the paraspinous fascia. 2-0 Vicryl was used to close the subcutaneous fascia and 3-0 Vicryl was used to close the subcuticular  skin. Blood loss was estimated as 100 cc. The patient was returned to the recovery room in stable condition

## 2011-05-16 NOTE — Progress Notes (Signed)
Patient ID: John [name], male   DOB: Aug 12, 1940, 71 y.o.   MRN: [id number] Postoperative check Patient is alert oriented feels comfortable with his back his legs move well with good strength in the dorsiflexors and plantar flexors to confrontational testing. He has not ambulated yet. He denies any headache.  Patient is kept in the intensive. Because of multitude of medical problems including diabetes mellitus. I have requested a hospitalist consult to help manage his diabetes and medical issues. He'll be ambulated this evening.

## 2011-05-16 NOTE — Progress Notes (Signed)
Pt ambulated in hall way became weak after about 84ft ambulation. Escorted back to room and placed in bed. Pt did not tolerated well

## 2011-05-16 NOTE — Anesthesia Preprocedure Evaluation (Signed)
Anesthesia Evaluation  Patient identified by MRN, date of birth, ID band Patient awake    Reviewed: Allergy & Precautions, H&P , NPO status , Patient's Chart, lab work & pertinent test results  Airway Mallampati: II  Neck ROM: full    Dental   Pulmonary shortness of breath, COPDformer smoker         Cardiovascular hypertension, + CAD and + CABG + dysrhythmias Atrial Fibrillation     Neuro/Psych PSYCHIATRIC DISORDERS Anxiety    GI/Hepatic   Endo/Other  Diabetes mellitus-  Renal/GU      Musculoskeletal   Abdominal   Peds  Hematology   Anesthesia Other Findings   Reproductive/Obstetrics                           Anesthesia Physical Anesthesia Plan  ASA: III  Anesthesia Plan: General   Post-op Pain Management:    Induction: Intravenous  Airway Management Planned: Oral ETT  Additional Equipment:   Intra-op Plan:   Post-operative Plan: Extubation in OR  Informed Consent: I have reviewed the patients History and Physical, chart, labs and discussed the procedure including the risks, benefits and alternatives for the proposed anesthesia with the patient or authorized representative who has indicated his/her understanding and acceptance.     Plan Discussed with: CRNA and Surgeon  Anesthesia Plan Comments:         Anesthesia Quick Evaluation

## 2011-05-16 NOTE — Progress Notes (Signed)
PCCM Progress Note: Social Visit  I have socially seen this patient.  I appreciate the [facility] in seeing this patient and helping with his medical issues.  He has Golds IV COPD on Advair /Spiriva/Oxygen.  He is stable postop for lumbar surgery.  I will see the patient again prn.  Call [phone] for any ICU issues or questions.  The pt will be in 3100 for observation.  It is ok for [facility] to consult while pt is in 3100 for purposes of observation.  Shan Levans Beeper  [phone]  Cell  [phone]  If no response or cell goes to voicemail, call beeper [phone]

## 2011-05-16 NOTE — Anesthesia Procedure Notes (Signed)
Procedure Name: Intubation Date/Time: 05/16/2011 8:31 AM Performed by: Wray Kearns A Pre-anesthesia Checklist: Patient identified, Timeout performed, Emergency Drugs available, Suction available and Patient being monitored Patient Re-evaluated:Patient Re-evaluated prior to inductionOxygen Delivery Method: Circle system utilized Preoxygenation: Pre-oxygenation with 100% oxygen Intubation Type: IV induction and Cricoid Pressure applied Ventilation: Mask ventilation without difficulty and Oral airway inserted - appropriate to patient size Laryngoscope Size: Mac and 4 Grade View: Grade I Tube type: Oral Tube size: 8.0 mm Number of attempts: 1 Airway Equipment and Method: LTA kit utilized and Stylet Placement Confirmation: ETT inserted through vocal cords under direct vision,  breath sounds checked- equal and bilateral,  positive ETCO2 and CO2 detector Secured at: 23 cm Tube secured with: Tape Dental Injury: Teeth and Oropharynx as per pre-operative assessment

## 2011-05-16 NOTE — Preoperative (Signed)
Beta Blockers   Reason not to administer Beta Blockers:Not Applicable 

## 2011-05-17 ENCOUNTER — Encounter (HOSPITAL_COMMUNITY): Payer: Self-pay | Admitting: Neurological Surgery

## 2011-05-17 LAB — HEMOGLOBIN A1C
Hgb A1c MFr Bld: 6.7 % — ABNORMAL HIGH (ref <5.7)
Mean Plasma Glucose: 146 mg/dL — ABNORMAL HIGH (ref <117)

## 2011-05-17 LAB — GLUCOSE, CAPILLARY
Glucose-Capillary: 129 mg/dL — ABNORMAL HIGH (ref 70–99)
Glucose-Capillary: 124 mg/dL — ABNORMAL HIGH (ref 70–99)

## 2011-05-17 MED ORDER — FUROSEMIDE 80 MG PO TABS
80.0000 mg | ORAL_TABLET | Freq: Every day | ORAL | Status: DC
  Administered 2011-05-18: 80 mg via ORAL
  Filled 2011-05-17 (×2): qty 1

## 2011-05-17 MED ORDER — FUROSEMIDE 40 MG PO TABS
40.0000 mg | ORAL_TABLET | Freq: Every evening | ORAL | Status: DC
  Administered 2011-05-18: 40 mg via ORAL
  Filled 2011-05-17: qty 1

## 2011-05-17 NOTE — Progress Notes (Signed)
Patient transferred to [id number]. Report given to receiving nurse.

## 2011-05-17 NOTE — Progress Notes (Signed)
UR COMPLETED  

## 2011-05-17 NOTE — Evaluation (Signed)
Physical Therapy Evaluation Patient Details Name: John [name] MRN: [id number] DOB: [birth year] Today's Date: 05/17/2011  Problem List:  Patient Active Problem List  Diagnoses  . PURE HYPERCHOLESTEROLEMIA  . HYPERLIPIDEMIA-MIXED  . Coronary atherosclerosis of native coronary artery  . CORONARY ATHEROSLERO UNSPEC TYPE BYPASS GRAFT  . PERICARDIAL EFFUSION  . Atrial fibrillation  . Chronic diastolic heart failure  . PVD WITH CLAUDICATION  . INTERMITTENT CLAUDICATION, BILATERAL  . Orthostatic hypotension  . PLEURAL EFFUSION, RIGHT  . LEG PAIN, RIGHT  . Nonspecific abnormal unspecified cardiovascular function study  . Carotid stenosis  . Chronic kidney disease  . COPD (chronic obstructive pulmonary disease)  . Dyspnea  . First degree AV block  . Back pain  . Spondylosis  . Herniated nucleus pulposus    Past Medical History:  Past Medical History  Diagnosis Date  . CAD (coronary artery disease)     a. myoview 1/11: lg inf fixed defect;   b. cath 1/11: 3v CAD,  c. s/p CABG 1/11 c/b acute renal failure and AFib  . Atrial fibrillation   . Chronic diastolic heart failure     a. echo 4/11: EF 45-50%, mod LVH, LAE, inf and septal HK  . First degree AV block   . Orthostatic hypotension     pyridostigmine  . History of renal transplant   . DM2 (diabetes mellitus, type 2)   . Diabetic gastroparesis   . HTN (hypertension)   . HLD (hyperlipidemia)   . PAD (peripheral artery disease)     s/p R SFA-Pop atherectomy 11/11  . Prostate cancer   . ED (erectile dysfunction)   . DDD (degenerative disc disease), lumbar   . Tremor   . Obesity   . Low testosterone   . Chronic kidney disease     s/p renal transplant 2002  . Pleural effusion, right     loculated  . COPD (chronic obstructive pulmonary disease)   . Anxiety   . Diabetes mellitus     insulin pump   Past Surgical History:  Past Surgical History  Procedure Date  . Coronary artery bypass graft 03/2009  . Cataract  extraction   . Vitrectomy   . Tonsillectomy   . Kidney transplant 2002  . Hernia repair   . Prostate cancer rx 2006 brachytherapy   . Prior peritoneal catheter placement     PT Assessment/Plan/Recommendation PT Assessment Clinical Impression Statement: Pt 71 y.o. male s/p L5, S1 laminectomy with resulting pain, decreased mobility, impaired balance, impaired gait, and decreased safety awareness.  Patient will benefit from skilled physical therapy in the acute setting to address these impairments.  Pt SaO2 decreased to 80% on 4L with ambulation and increased to 94% after ~1 minute in sitting (increased O2 to 5L).  PT Recommendation/Assessment: Patient will need skilled PT in the acute care venue PT Problem List: Decreased strength;Decreased activity tolerance;Decreased balance;Decreased mobility;Decreased coordination;Decreased knowledge of use of DME;Decreased safety awareness;Decreased knowledge of precautions;Pain;Impaired sensation Barriers to Discharge: Inaccessible home environment PT Therapy Diagnosis : Difficulty walking;Abnormality of gait;Generalized weakness;Acute pain PT Plan PT Frequency: Min 5X/week PT Treatment/Interventions: DME instruction;Gait training;Stair training;Functional mobility training;Therapeutic activities;Balance training;Neuromuscular re-education;Patient/family education PT Recommendation Follow Up Recommendations: Home health PT;Supervision/Assistance - 24 hour (Pt insistent in refusing rehab placement.) Equipment Recommended: Defer to next venue PT Goals  Acute Rehab PT Goals PT Goal Formulation: With patient/family Time For Goal Achievement: 7 days Pt will Roll Supine to Left Side: with supervision PT Goal: Rolling Supine to Left Side - Progress:  Goal set today Pt will go Supine/Side to Sit: with supervision PT Goal: Supine/Side to Sit - Progress: Goal set today Pt will go Sit to Supine/Side: with supervision PT Goal: Sit to Supine/Side - Progress:  Goal set today Pt will go Sit to Stand: with min assist PT Goal: Sit to Stand - Progress: Goal set today Pt will go Stand to Sit: with min assist PT Goal: Stand to Sit - Progress: Goal set today Pt will Ambulate: 51 - 150 feet;with min assist;with rolling walker PT Goal: Ambulate - Progress: Goal set today Pt will Go Up / Down Stairs: 3-5 stairs;with min assist;with least restrictive assistive device PT Goal: Up/Down Stairs - Progress: Goal set today  PT Evaluation Precautions/Restrictions  Precautions Precautions: Fall;Back Precaution Booklet Issued: No (Reviewed but not given.) Precaution Comments: O2. Required Braces or Orthoses: No Restrictions Weight Bearing Restrictions: No Prior Functioning  Home Living Lives With: Spouse Receives Help From: Family Type of Home: House Home Layout: Multi-level;Able to live on main level with bedroom/bathroom (3 levels) Alternate Level Stairs-Number of Steps: 12 Home Access: Stairs to enter Entrance Stairs-Rails: Right Entrance Stairs-Number of Steps: 3 Bathroom Shower/Tub: Engineer, manufacturing systems: Standard Home Adaptive Equipment: Hand-held shower hose;Shower chair with back;Straight cane;Walker - rolling;Bedside commode/3-in-1 Prior Function Level of Independence: Independent with gait;Needs assistance with ADLs Bath: Other (comment) (Assist to get in or out of shower) Dressing: Minimal (Occasional help with shoes. Assistance to bring items to him) Able to Take Stairs?: Yes Driving: Yes Vocation: Retired Leisure: Hobbies-yes (Comment) Comments: Fixes cars. Cognition Cognition Arousal/Alertness: Awake/alert Overall Cognitive Status: Appears within functional limits for tasks assessed Orientation Level: Oriented X4 Sensation/Coordination Sensation Light Touch: Impaired Detail Light Touch Impaired Details: Impaired LLE;Impaired RLE Additional Comments: Pt reports that his RLE is numb, numbness appears to improve with  movement.  With light touch patient able to discriminate when RLE is being touched but not ableto tell when left foot is being touched. Coordination Gross Motor Movements are Fluid and Coordinated: Yes Extremity Assessment RLE Assessment RLE Assessment: Exceptions to [facility] RLE Strength RLE Overall Strength: Deficits RLE Overall Strength Comments: Patient reports that his leg feels numb and is difficult to move.  Gross strength with functional movement 4/5. LLE Assessment LLE Assessment: Within Functional Limits Mobility (including Balance) Bed Mobility Bed Mobility: Yes Rolling Left: 3: Mod assist Rolling Left Details (indicate cue type and reason): Pt required mod assist to initiate rolling and VC for hand placement on rail and sequencing to adhere to back precautions. Left Sidelying to Sit: 3: Mod assist Left Sidelying to Sit Details (indicate cue type and reason): Pt required mod assist to perform transfer due to decreased strength and  Sitting - Scoot to Edge of Bed: 3: Mod assist Sitting - Scoot to Edge of Bed Details (indicate cue type and reason): Pt required assist to scoot R hip to EOB. Transfers Transfers: Yes Sit to Stand: 1: +2 Total assist;Patient percentage (comment);From bed;With upper extremity assist (pt = 60%) Sit to Stand Details (indicate cue type and reason): Pt required two person assist to initiate transfer and VC for hand placement with RW. Stand to Sit: 3: Mod assist;To chair/3-in-1;With upper extremity assist;With armrests Stand to Sit Details: Pt required mod assist to control descent into chair. Ambulation/Gait Ambulation/Gait: Yes Ambulation/Gait Assistance: 1: +2 Total assist;Patient percentage (comment) (pt = 60%) Ambulation/Gait Assistance Details (indicate cue type and reason): Pt required two person assist for safety and VC and manual cues for placement of RW. Ambulation  Distance (Feet): 30 Feet Assistive device: Rolling walker Gait Pattern: Step-to  pattern;Decreased stride length Stairs: No Wheelchair Mobility Wheelchair Mobility: No  Posture/Postural Control Posture/Postural Control: No significant limitations Balance Balance Assessed: Yes Static Sitting Balance Static Sitting - Balance Support: Feet supported;Bilateral upper extremity supported Static Sitting - Level of Assistance: 4: Min assist Static Sitting - Comment/# of Minutes: 5 minutes. Exercise    End of Session PT - End of Session Equipment Utilized During Treatment: Gait belt Activity Tolerance: Patient limited by fatigue;Treatment limited secondary to medical complications (Comment) (Pt with SOB and SPO2 of 80% while on 4L O2.) Patient left: in chair;with call bell in reach;with family/visitor present Nurse Communication: Mobility status for transfers;Mobility status for ambulation General Behavior During Session: Agitated Cognition: WFL for tasks performed  Ezzard Standing SPT 05/17/2011, 1:23 PM  [place], PT DPT [phone]

## 2011-05-17 NOTE — Progress Notes (Signed)
Subjective: Patient reports legs feel better. Back sore  Objective: Vital signs in last 24 hours: Temp:  [97.9 F (36.6 C)-98.6 F (37 C)] 97.9 F (36.6 C) (03/27 0700) Pulse Rate:  [61-80] 78  (03/27 1800) Resp:  [19-29] 21  (03/27 1800) BP: (102-135)/(38-82) 133/62 mmHg (03/27 1800) SpO2:  [87 %-94 %] 90 % (03/27 1800) Weight:  [105.4 kg (232 lb 5.8 oz)] 105.4 kg (232 lb 5.8 oz) (03/26 1928)  Intake/Output from previous day: 03/26 0701 - 03/27 0700 In: 1780 [P.O.:480; I.V.:1300] Out: 1050 [Urine:700; Blood:350] Intake/Output this shift: Total I/O In: 1320 [P.O.:1320] Out: 300 [Urine:300]  incision clean and dry . Motor function intact.  Lab Results: No results found for this basename: WBC:2,HGB:2,HCT:2,PLT:2 in the last 72 hours BMET No results found for this basename: NA:2,K:2,CL:2,CO2:2,GLUCOSE:2,BUN:2,CREATININE:2,CALCIUM:2 in the last 72 hours  Studies/Results: Dg Lumbar Spine 1 View  05/16/2011  *RADIOLOGY REPORT*  Clinical Data: Intraoperative localization for lumbar surgery.  LUMBAR SPINE - 1 VIEW  Comparison: Lumbar spine films 04/13/2011.  Findings: A spinal needle is projecting at the L5-S1 disc space level.  IMPRESSION: L5-S1 marked intraoperatively.  Original Report Authenticated By: P. Loralie Champagne, M.D.    Assessment/Plan: stabke post op .  LOS: 1 day  transfer to floor   John [name] 05/17/2011, 6:22 PM

## 2011-05-17 NOTE — Progress Notes (Signed)
Patient ID: John [name], male   DOB: 1941-01-28, 71 y.o.   MRN: [id number]   Patient tolerated his surgery without complications.  Has been walking but legs hurt.  He does appear somewhat volume overloaded on exam with increased JVP.  I am going to put him back on his home dose of Lasix, 80 qam/40 qpm.   [name] 05/17/2011 9:01 PM

## 2011-05-17 NOTE — Consult Note (Signed)
[facility] - Consult F/U Note [place] TEAM 1 - Stepdown/ICU TEAM  Subjective: 71 y/o CM with Diabetes (on insulin pump at home), COPD, CAD (s/p CABG) , HPL, HTN, CKD s/p renal transplant x 2 and lumbar degenerative disc disease. He presented to the hospital for elective surgery given his worsening radiculopathy.  He is doing well today.  He is managing his own insulin pump, and feels comfortable doing so.  He denies CP, SOB, N/V, or abdom pain.    Objective: Weight change:   Intake/Output Summary (Last 24 hours) at 05/17/11 1511 Last data filed at 05/17/11 1200  Gross per 24 hour  Intake   1440 ml  Output    850 ml  Net    590 ml   Blood pressure 102/41, pulse 72, temperature 97.9 F (36.6 C), temperature source Oral, resp. rate 19, height 6\' 2"  (1.88 m), weight 105.4 kg (232 lb 5.8 oz), SpO2 90.00%.  CBG (last 3)   Basename 05/17/11 1257 05/17/11 0729 05/16/11 2218  GLUCAP 136* 129* 180*   Physical Exam: General: No acute respiratory distress Lungs: Clear to auscultation bilaterally without wheezes or crackles but BS are quite distant B Cardiovascular: Regular rate without murmur gallop or rub normal S1 and S2 Abdomen: Nontender, nondistended, soft, bowel sounds positive, no rebound, no ascites, no appreciable mass Extremities: No significant cyanosis, clubbing, or edema bilateral lower extremities  Lab Results:  Basename 05/16/11 1639  HGBA1C 6.7*    Micro Results: Recent Results (from the past 240 hour(s))  SURGICAL PCR SCREEN     Status: Normal   Collection Time   05/10/11  1:34 PM      Component Value Range Status Comment   MRSA, PCR NEGATIVE  NEGATIVE  Final    Staphylococcus aureus NEGATIVE  NEGATIVE  Final     Studies/Results: All recent x-ray/radiology reports have been reviewed in detail.   Medications: I have reviewed the patient's complete medication list.  Recommendations:  DM Pt on an insulin pump managed by Dr. Leslie Dales in outpt  setting - CBG well controlled at this time - A1c is 6.7 - cont to allow pt to manage his DM using his insulin pump - no need to adjust his algorithm at this time  Gold Stage IV COPD  on Advair /Spiriva/Oxygen chronically - followed by Dr. Delford Field in outpt setting - clinically well compensated at present  Chronic loculated right>left pleural effusion right thoracentesis (6/12), cytology negative for malignancy  Hx of Afib Rate controlled - sinus to ascultation today - appears this was simply a short therm post-op issue - is not on chronic anticoag  Chronic diastolic CHF Preserved EF by most recent echo (6/12) - appears euvolemic at present  HTN Well controlled at present   PAD atherectomy right SFA and right popliteal on 01/12/10 - complicated by left groin pseudoaneurysm  CKD s/p renal transplant 2002  Hx of CAD s/p CABG x5 Jan 2011  Dispo We will continue to follow along with you  Lonia Blood, MD [facility] Office  [phone] Pager [phone]  On-Call/Text Page:      [email or web address]      password [facility]

## 2011-05-17 NOTE — Progress Notes (Signed)
Occupational Therapy Evaluation Patient Details Name: John [name] MRN: [id number] DOB: 1941-02-12 Today's Date: 05/17/2011  Problem List:  Patient Active Problem List  Diagnoses  . PURE HYPERCHOLESTEROLEMIA  . HYPERLIPIDEMIA-MIXED  . Coronary atherosclerosis of native coronary artery  . CORONARY ATHEROSLERO UNSPEC TYPE BYPASS GRAFT  . PERICARDIAL EFFUSION  . Atrial fibrillation  . Chronic diastolic heart failure  . PVD WITH CLAUDICATION  . INTERMITTENT CLAUDICATION, BILATERAL  . Orthostatic hypotension  . PLEURAL EFFUSION, RIGHT  . LEG PAIN, RIGHT  . Nonspecific abnormal unspecified cardiovascular function study  . Carotid stenosis  . Chronic kidney disease  . COPD (chronic obstructive pulmonary disease)  . Dyspnea  . First degree AV block  . Back pain  . Spondylosis  . Herniated nucleus pulposus    Past Medical History:  Past Medical History  Diagnosis Date  . CAD (coronary artery disease)     a. myoview 1/11: lg inf fixed defect;   b. cath 1/11: 3v CAD,  c. s/p CABG 1/11 c/b acute renal failure and AFib  . Atrial fibrillation   . Chronic diastolic heart failure     a. echo 4/11: EF 45-50%, mod LVH, LAE, inf and septal HK  . First degree AV block   . Orthostatic hypotension     pyridostigmine  . History of renal transplant   . DM2 (diabetes mellitus, type 2)   . Diabetic gastroparesis   . HTN (hypertension)   . HLD (hyperlipidemia)   . PAD (peripheral artery disease)     s/p R SFA-Pop atherectomy 11/11  . Prostate cancer   . ED (erectile dysfunction)   . DDD (degenerative disc disease), lumbar   . Tremor   . Obesity   . Low testosterone   . Chronic kidney disease     s/p renal transplant 2002  . Pleural effusion, right     loculated  . COPD (chronic obstructive pulmonary disease)   . Anxiety   . Diabetes mellitus     insulin pump   Past Surgical History:  Past Surgical History  Procedure Date  . Coronary artery bypass graft 03/2009  . Cataract  extraction   . Vitrectomy   . Tonsillectomy   . Kidney transplant 2002  . Hernia repair   . Prostate cancer rx 2006 brachytherapy   . Prior peritoneal catheter placement   . Lumbar laminectomy/decompression microdiscectomy 05/16/2011    Procedure: LUMBAR LAMINECTOMY/DECOMPRESSION MICRODISCECTOMY 1 LEVEL;  Surgeon: Barnett Abu, MD;  Location: [facility];  Service: Neurosurgery;  Laterality: Bilateral;  Bilateral Lumbar Five-Sacral One Laminectomy    OT Assessment/Plan/Recommendation OT Assessment Clinical Impression Statement: Pt s/p L5, S1 laminectomy and with history of COPD. Will benefit from acute OT services to address below problem list in prep for d/c home with HHOT.  May need to recommend SNF pending pt progress. Pt SaO2 decreased to 80% on 4L with ambulation and increased to 94% after ~1 minute in sitting (increased O2 to 5L).  OT Recommendation/Assessment: Patient will need skilled OT in the acute care venue OT Problem List: Decreased strength;Decreased activity tolerance;Impaired balance (sitting and/or standing);Decreased knowledge of use of DME or AE;Decreased knowledge of precautions;Pain OT Therapy Diagnosis : Generalized weakness;Acute pain OT Plan OT Frequency: Min 2X/week OT Treatment/Interventions: Self-care/ADL training;Energy conservation;DME and/or AE instruction;Therapeutic activities;Patient/family education;Balance training OT Recommendation Follow Up Recommendations: Home health OT;Supervision/Assistance - 24 hour (Pt insistent on returning home) Equipment Recommended: Defer to next venue Individuals Consulted Consulted and Agree with Results and Recommendations: Patient OT  Goals Acute Rehab OT Goals OT Goal Formulation: With patient Time For Goal Achievement: 7 days ADL Goals Pt Will Perform Grooming: Standing at sink;with supervision ADL Goal: Grooming - Progress: Goal set today Pt Will Perform Lower Body Bathing: with supervision;Sit to stand from  chair;Sit to stand from bed;with adaptive equipment ADL Goal: Lower Body Bathing - Progress: Goal set today Pt Will Perform Lower Body Dressing: with supervision;Sit to stand from chair;Sit to stand from bed;with adaptive equipment ADL Goal: Lower Body Dressing - Progress: Goal set today Pt Will Transfer to Toilet: with supervision;Ambulation;with DME;3-in-1;Maintaining back safety precautions ADL Goal: Toilet Transfer - Progress: Goal set today Pt Will Perform Toileting - Clothing Manipulation: with supervision;Standing ADL Goal: Toileting - Clothing Manipulation - Progress: Goal set today Pt Will Perform Toileting - Hygiene: with supervision ADL Goal: Toileting - Hygiene - Progress: Goal set today Pt Will Perform Tub/Shower Transfer: Shower transfer;Ambulation;with DME;with supervision;Maintaining back safety precautions;Shower seat with back ADL Goal: Web designer - Progress: Goal set today Miscellaneous OT Goals Miscellaneous OT Goal #1: Pt will verbalize and demonstrate 3/3 back precautions during ADL activity. OT Goal: Miscellaneous Goal #1 - Progress: Goal set today Miscellaneous OT Goal #2: Pt will perform bed mobility with supervision in prep for EOB ADLs. OT Goal: Miscellaneous Goal #2 - Progress: Goal set today  OT Evaluation Precautions/Restrictions  Precautions Precautions: Fall;Back Precaution Booklet Issued: No Precaution Comments: O2. Required Braces or Orthoses: No Restrictions Weight Bearing Restrictions: No Prior Functioning Home Living Lives With: Spouse Receives Help From: Family Type of Home: House Home Layout: Multi-level;Able to live on main level with bedroom/bathroom Alternate Level Stairs-Number of Steps: 12 Home Access: Stairs to enter Entrance Stairs-Rails: Right Entrance Stairs-Number of Steps: 3 Bathroom Shower/Tub: Tub/shower unit;Walk-in shower Bathroom Toilet: Standard Home Adaptive Equipment: Hand-held shower hose;Shower chair with  back;Straight cane;Walker - rolling;Bedside commode/3-in-1 Prior Function Level of Independence: Independent with gait;Needs assistance with ADLs Bath:  (occasional assist to get in/out of shower) Dressing:  (occasional assist with shoes.) Able to Take Stairs?: Yes Driving: Yes Vocation: Retired Leisure: Hobbies-yes (Comment) Comments: fixes cars ADL ADL Grooming: Simulated;Set up Grooming Details (indicate cue type and reason): setup to gather grooming items Where Assessed - Grooming: Sitting, bed Lower Body Bathing: Simulated;Moderate assistance Lower Body Bathing Details (indicate cue type and reason): Pt unable to cross ankles over knees to reach feet as he had done before surgery. Where Assessed - Lower Body Bathing: Sitting, bed Upper Body Dressing: Set up;Performed Upper Body Dressing Details (indicate cue type and reason): Donned gown with setup assist to snap sleeve around lines Where Assessed - Upper Body Dressing: Sitting, bed Lower Body Dressing: Simulated;Moderate assistance Lower Body Dressing Details (indicate cue type and reason): pt unable to cross ankles over knees to reach feet as he had done before surgery. Where Assessed - Lower Body Dressing: Sitting, bed Toilet Transfer: Simulated;+2 Total assistance;Comment for patient % (60%) Toilet Transfer Details (indicate cue type and reason): +2 assist for safety,  Pt requiring increased time.  Toilet Transfer Method: Proofreader: Other (comment) (chair) Equipment Used: Rolling walker Ambulation Related to ADLs: Pt reports that Rt foot feels numb during ambulation. +2 assist for safety and for lines and leads.  Pt fatigued quickly due to SOB. ADL Comments: Pt educated on safe technique for LB ADLs in order to avoid bending. Vision/Perception    Cognition Cognition Arousal/Alertness: Awake/alert Overall Cognitive Status: Appears within functional limits for tasks assessed Orientation Level:  Oriented X4 Sensation/Coordination Coordination  Gross Motor Movements are Fluid and Coordinated: Yes (bil. UE) Fine Motor Movements are Fluid and Coordinated: Yes (bil. UE) Extremity Assessment RUE Assessment RUE Assessment: Within Functional Limits LUE Assessment LUE Assessment: Within Functional Limits Mobility  Bed Mobility Bed Mobility: Yes Rolling Left: 3: Mod assist Rolling Left Details (indicate cue type and reason): Pt required mod assist to initiate rolling and VC for hand placement on rail and sequencing to adhere to back precautions. Left Sidelying to Sit: 3: Mod assist Left Sidelying to Sit Details (indicate cue type and reason): Pt required mod assist to perform transfer due to decreased strength and to maintain back precautions Sitting - Scoot to Edge of Bed: 3: Mod assist Sitting - Scoot to Edge of Bed Details (indicate cue type and reason): Assist for scooting R hip to EOB Transfers Transfers: Yes Sit to Stand: 1: +2 Total assist;Patient percentage (comment);From bed;With upper extremity assist (60%) Sit to Stand Details (indicate cue type and reason): +2 assist to intiate transfer and VC for hand placement with RW Stand to Sit: 3: Mod assist;To chair/3-in-1;With upper extremity assist;With armrests Stand to Sit Details: Pt required mod assist to control descent into chair. Exercises   End of Session OT - End of Session Equipment Utilized During Treatment: Gait belt Activity Tolerance: Patient limited by fatigue Patient left: in chair;with call bell in reach;with family/visitor present Nurse Communication: Mobility status for transfers General Behavior During Session: Other (comment) (anxious) Cognition: WFL for tasks performed  3:43 PM  05/17/2011 Cipriano Mile OTR/L Pager [phone] Office [phone]

## 2011-05-18 ENCOUNTER — Inpatient Hospital Stay (HOSPITAL_COMMUNITY): Payer: Medicare Other

## 2011-05-18 DIAGNOSIS — I25810 Atherosclerosis of coronary artery bypass graft(s) without angina pectoris: Secondary | ICD-10-CM

## 2011-05-18 LAB — CBC
HCT: 30.3 % — ABNORMAL LOW (ref 39.0–52.0)
Hemoglobin: 9.3 g/dL — ABNORMAL LOW (ref 13.0–17.0)
MCH: 21.9 pg — ABNORMAL LOW (ref 26.0–34.0)
MCHC: 30.7 g/dL (ref 30.0–36.0)
RDW: 18.6 % — ABNORMAL HIGH (ref 11.5–15.5)

## 2011-05-18 LAB — GLUCOSE, CAPILLARY
Glucose-Capillary: 111 mg/dL — ABNORMAL HIGH (ref 70–99)
Glucose-Capillary: 96 mg/dL (ref 70–99)

## 2011-05-18 LAB — BASIC METABOLIC PANEL
BUN: 21 mg/dL (ref 6–23)
Chloride: 100 mEq/L (ref 96–112)
Creatinine, Ser: 1.07 mg/dL (ref 0.50–1.35)
GFR calc Af Amer: 79 mL/min — ABNORMAL LOW (ref >90)
Glucose, Bld: 101 mg/dL — ABNORMAL HIGH (ref 70–99)

## 2011-05-18 MED ORDER — FUROSEMIDE 10 MG/ML IJ SOLN
40.0000 mg | Freq: Two times a day (BID) | INTRAMUSCULAR | Status: DC
  Administered 2011-05-18 – 2011-05-19 (×2): 40 mg via INTRAVENOUS
  Filled 2011-05-18 (×4): qty 4

## 2011-05-18 NOTE — Progress Notes (Signed)
Patient ID: John [name], male   DOB: 1940/08/20, 71 y.o.   MRN: [id number]    SUBJECTIVE: Received multiple doses of morphine last night, has been lethargic and mildly confused throughout the day today.  Oxygen saturation lower this evening, in upper 80s.  He has a low grade fever.       Marland Kitchen aspirin EC  81 mg Oral Daily  . atorvastatin  40 mg Oral q1800  . calcium carbonate  2 tablet Oral Daily  . carbidopa-levodopa  1 tablet Oral TID  . docusate sodium  100 mg Oral BID  . ezetimibe  10 mg Oral Daily  . Fluticasone-Salmeterol  1 puff Inhalation Q12H  . furosemide  40 mg Intravenous BID  . insulin pump   Subcutaneous TID AC, HS, 0200  . isosorbide mononitrate  60 mg Oral Daily  . lamoTRIgine  200 mg Oral Daily  . linagliptin  5 mg Oral Daily  . magnesium oxide  800 mg Oral BID  . mycophenolate  500 mg Oral BID  . pantoprazole  40 mg Oral BID  . predniSONE  5 mg Oral Daily  . pyridostigmine  60 mg Oral TID  . risperiDONE  2 mg Oral QHS  . senna  1 tablet Oral BID  . sodium chloride  3 mL Intravenous Q12H  . tacrolimus  5 mg Oral BID  . Tamsulosin HCl  0.4 mg Oral Daily  . tiotropium  18 mcg Inhalation Daily  . Vilazodone HCl  20 mg Oral Daily  . DISCONTD: furosemide  40 mg Oral Daily  . DISCONTD: furosemide  40 mg Oral QPM  . DISCONTD: furosemide  80 mg Oral Q breakfast      Filed Vitals:   05/18/11 1014 05/18/11 1026 05/18/11 1351 05/18/11 1734  BP: 123/58  132/59 150/59  Pulse: 92  87 75  Temp: 100.1 F (37.8 C)  99.4 F (37.4 C) 99.5 F (37.5 C)  TempSrc:   Oral Oral  Resp: 20  18 18   Height:      Weight:      SpO2: 87% 89% 87% 86%    Intake/Output Summary (Last 24 hours) at 05/18/11 1757 Last data filed at 05/18/11 0900  Gross per 24 hour  Intake    360 ml  Output    275 ml  Net     85 ml    LABS: Basic Metabolic Panel:  Basename 05/18/11 0610  NA 137  K 4.6  CL 100  CO2 30  GLUCOSE 101*  BUN 21  CREATININE 1.07  CALCIUM 9.5  MG --  PHOS  --   Liver Function Tests: No results found for this basename: AST:2,ALT:2,ALKPHOS:2,BILITOT:2,PROT:2,ALBUMIN:2 in the last 72 hours No results found for this basename: LIPASE:2,AMYLASE:2 in the last 72 hours CBC:  Basename 05/18/11 0610  WBC 12.1*  NEUTROABS --  HGB 9.3*  HCT 30.3*  MCV 71.3*  PLT 143*   Cardiac Enzymes: No results found for this basename: CKTOTAL:3,CKMB:3,CKMBINDEX:3,TROPONINI:3 in the last 72 hours BNP: No components found with this basename: POCBNP:3 D-Dimer: No results found for this basename: DDIMER:2 in the last 72 hours Hemoglobin A1C:  Basename 05/16/11 1639  HGBA1C 6.7*   Fasting Lipid Panel: No results found for this basename: CHOL,HDL,LDLCALC,TRIG,CHOLHDL,LDLDIRECT in the last 72 hours Thyroid Function Tests: No results found for this basename: TSH,T4TOTAL,FREET3,T3FREE,THYROIDAB in the last 72 hours Anemia Panel: No results found for this basename: VITAMINB12,FOLATE,FERRITIN,TIBC,IRON,RETICCTPCT in the last 72 hours  RADIOLOGY: Dg Chest 2 View  05/10/2011  *RADIOLOGY REPORT*  Clinical Data: 71 year old male.  Preoperative study.  History of pleural effusion, hypertension.  CHEST - 2 VIEW  Comparison: 01/24/2011 and earlier.  Findings: Chronic subpleural opacity greater on the right and posteriorly compatible with chronic loculated pleural effusions. Associated hazy bilateral basilar predominant pulmonary opacity. No significant change since 08/18/2010. Stable cardiomegaly and mediastinal contours.  Sequelae of CABG.  No pneumothorax or pulmonary edema.  IMPRESSION: Chronic loculated pleural effusions greater on the right.  Chronic basilar predominant pulmonary opacity. No superimposed acute findings are identified.  Original Report Authenticated By: Harley Hallmark, M.D.   Dg Lumbar Spine 1 View  05/16/2011  *RADIOLOGY REPORT*  Clinical Data: Intraoperative localization for lumbar surgery.  LUMBAR SPINE - 1 VIEW  Comparison: Lumbar spine films  04/13/2011.  Findings: A spinal needle is projecting at the L5-S1 disc space level.  IMPRESSION: L5-S1 marked intraoperatively.  Original Report Authenticated By: P. Loralie Champagne, M.D.    PHYSICAL EXAM General: NAD Neck: JVP 10-12 cm, no thyromegaly or thyroid nodule.  Lungs: Dependent crackles.  CV: Nondisplaced PMI.  Heart regular S1/S2, no S3/S4, 1/6 SEM RUSB.  1+ edema 1/2 up lower legs bilaterally.  No carotid bruit.  Normal pedal pulses.  Abdomen: Soft, nontender, no hepatosplenomegaly, no distention.  Neurologic: Drowsy, thinks he is at [facility] and today is Saturday.  Follows commands.  Psych: Normal affect. Extremities: No clubbing or cyanosis.   ASSESSMENT AND PLAN: 71 yo with history of renal transplant, DM, HTN, COPD on home oxygen, diastolic CHF, and CAD s/p CABG is now s/p back surgery.  He is drowsy and delirious tonight.  1. Delirium: Post-operative in setting of pain meds.  Also with volume overload.  Additionally has low grade fever, rule out infection.  Minimize narcotics/benzos.  2. CHF: Volume overloaded on exam.  Acute on chronic diastolic CHF.  BNP elevated.  Oxygen saturation lower today.  - Change Lasix to IV, 40 mg IV bid.  - Strict I/Os, daily weights.  - CXR 3. ID: Low grade fever post-op. ? Atelectasis.  WBCs are mildly elevated.  Will get CXR.  Blood culture given immunocompromised (immunosuppressant use).   [name] 05/18/2011 6:05 PM

## 2011-05-18 NOTE — Progress Notes (Signed)
Subjective: Patient reports Increased pain last night in both lower extremities. Required IV morphine on several occasions. Now seems mildly confused  Objective: Vital signs in last 24 hours: Temp:  [98.2 F (36.8 C)-98.9 F (37.2 C)] 98.3 F (36.8 C) (03/28 0559) Pulse Rate:  [71-98] 98  (03/28 0559) Resp:  [18-27] 18  (03/28 0559) BP: (102-151)/(38-82) 151/58 mmHg (03/28 0559) SpO2:  [89 %-93 %] 92 % (03/28 0559) FiO2 (%):  [32 %] 32 % (03/27 2016)  Intake/Output from previous day: 03/27 0701 - 03/28 0700 In: 1320 [P.O.:1320] Out: 575 [Urine:575] Intake/Output this shift:    Motor function is good in both lower extremities patient has been ambulatory incision is clean and dry  Lab Results:  [facility] 05/18/11 0610  WBC 12.1*  HGB 9.3*  HCT 30.3*  PLT 143*   BMET  Basename 05/18/11 0610  NA 137  K 4.6  CL 100  CO2 30  GLUCOSE 101*  BUN 21  CREATININE 1.07  CALCIUM 9.5    Studies/Results: Dg Lumbar Spine 1 View  05/16/2011  *RADIOLOGY REPORT*  Clinical Data: Intraoperative localization for lumbar surgery.  LUMBAR SPINE - 1 VIEW  Comparison: Lumbar spine films 04/13/2011.  Findings: A spinal needle is projecting at the L5-S1 disc space level.  IMPRESSION: L5-S1 marked intraoperatively.  Original Report Authenticated By: P. Loralie Champagne, M.D.    Assessment/Plan: Increase radicular pain and lower extremities. DC IV morphine observed today in hospital.  LOS: 2 days  DC IV morphine allow patient to shower encourage ambulation   Devri Kreher J 05/18/2011, 8:34 AM    2

## 2011-05-18 NOTE — Progress Notes (Signed)
Occupational Therapy Treatment Patient Details Name: John [name] MRN: [id number] DOB: 1940-04-17 Today's Date: 05/18/2011  OT Assessment/Plan OT Assessment/Plan Comments on Treatment Session: Pt with decreased activity tolerance today.  Very lethargic and unable to fully participate in session possibly due to morphine.  RN made aware.  Pt sat EOB ~10 minutes with +2 assist and fluctuated between 80-87% on 4L nasal cannula.  Pt unsafe to attempt stand at this point.  Will need to update d/c plan tomorrow pending pt progress. OT Plan: Discharge plan remains appropriate OT Frequency: Min 2X/week Follow Up Recommendations: Home health OT;Supervision/Assistance - 24 hour (possibly SNF pending pt progress) Equipment Recommended: Defer to next venue OT Goals Acute Rehab OT Goals OT Goal Formulation: With patient Time For Goal Achievement: 7 days Miscellaneous OT Goals Miscellaneous OT Goal #2: Pt will perform bed mobility with supervision in prep for EOB ADLs. OT Goal: Miscellaneous Goal #2 - Progress: Progressing toward goals  OT Treatment Precautions/Restrictions  Precautions Precautions: Fall;Back Precaution Booklet Issued: No Precaution Comments: Pt unable to verbalize precautions Required Braces or Orthoses: No Restrictions Weight Bearing Restrictions: No   ADL ADL ADL Comments: Pt sat EOB ~10 minutes with +2 total pt40%. Pt leaning heavily to the right side and required max manual cues and tactile cues for hand placement to assist in facilitating midline orientation. Mobility  Bed Mobility Bed Mobility: Yes Rolling Right: With rail;2: Max assist Rolling Right Details (indicate cue type and reason): Max verbal and manual cues for sequencing and to adhere to back precautions. Rolling Left: With rail;2: Max assist Rolling Left Details (indicate cue type and reason): A to bend knees and initiate rolling at hips and shouders. Pt able to reach for rail.  Right Sidelying to Sit:  1: +2 Total assist;Patient percentage (comment) (20%) Right Sidelying to Sit Details (indicate cue type and reason): +2 assist to support trunk and legs OOB while adhering to back precautions.  Pt resisting transition from sidelying to sit. Left Sidelying to Sit: 1: +2 Total assist;Patient percentage (comment);With rails (p=20%) Left Sidelying to Sit Details (indicate cue type and reason): A required for all aspects of transfer. Pt became resistance with LE positioning. A to control trunk from posterior lean as patient tends to lean back onto support.  Sitting - Scoot to Edge of Bed: 1: +2 Total assist;Other (comment) (p=20%) Sitting - Scoot to Edge of Bed Details (indicate cue type and reason): A with use of pad Sit to Supine: 1: +2 Total assist;Patient percentage (comment) (p=0%) Sit to Supine - Details (indicate cue type and reason): A for all aspects. Cues for technique and sequency.  Exercises    End of Session OT - End of Session Activity Tolerance: Patient limited by fatigue;Patient limited by pain Patient left: in bed;with call bell in reach;with family/visitor present Nurse Communication: Mobility status for transfers;Other (comment) (limited activity tolerance) General Behavior During Session: Lethargic Cognition: Impaired Cognitive Impairment: Pt with decreased arousal and attention this session. Poor participation. RN stated that patient did not appear this way until after morphine was given.   11:54 AM 05/18/2011 Cipriano Mile OTR/L Pager [phone] Office [phone]

## 2011-05-18 NOTE — Progress Notes (Addendum)
Name: John [name] MRN: [id number] DOB: 05-25-1940    LOS: 2  PCCM Progress NOTE  History of Present Illness: 71 y.o. WM s/p lum lam single level.  Complex Med Hx includes Gold D COPD oxygen dependent.  PWright follows in office  Lines / Drains: none  Cultures: none  Antibiotics: none  Tests / Events: none    Past Medical History  Diagnosis Date  . CAD (coronary artery disease)     a. myoview 1/11: lg inf fixed defect;   b. cath 1/11: 3v CAD,  c. s/p CABG 1/11 c/b acute renal failure and AFib  . Atrial fibrillation   . Chronic diastolic heart failure     a. echo 4/11: EF 45-50%, mod LVH, LAE, inf and septal HK  . First degree AV block   . Orthostatic hypotension     pyridostigmine  . History of renal transplant   . DM2 (diabetes mellitus, type 2)   . Diabetic gastroparesis   . HTN (hypertension)   . HLD (hyperlipidemia)   . PAD (peripheral artery disease)     s/p R SFA-Pop atherectomy 11/11  . Prostate cancer   . ED (erectile dysfunction)   . DDD (degenerative disc disease), lumbar   . Tremor   . Obesity   . Low testosterone   . Chronic kidney disease     s/p renal transplant 2002  . Pleural effusion, right     loculated  . COPD (chronic obstructive pulmonary disease)   . Anxiety   . Diabetes mellitus     insulin pump   Past Surgical History  Procedure Date  . Coronary artery bypass graft 03/2009  . Cataract extraction   . Vitrectomy   . Tonsillectomy   . Kidney transplant 2002  . Hernia repair   . Prostate cancer rx 2006 brachytherapy   . Prior peritoneal catheter placement   . Lumbar laminectomy/decompression microdiscectomy 05/16/2011    Procedure: LUMBAR LAMINECTOMY/DECOMPRESSION MICRODISCECTOMY 1 LEVEL;  Surgeon: Barnett Abu, MD;  Location: [facility];  Service: Neurosurgery;  Laterality: Bilateral;  Bilateral Lumbar Five-Sacral One Laminectomy   Prior to Admission medications   Medication Sig Start Date End Date Taking? Authorizing  Provider  albuterol (PROVENTIL HFA;VENTOLIN HFA) 108 (90 BASE) MCG/ACT inhaler Inhale 2 puffs into the lungs every 6 (six) hours as needed for wheezing. 08/26/10 08/26/11 Yes Nyoka Cowden, MD  aspirin EC 81 MG tablet Take 1 tablet (81 mg total) by mouth daily. 07/12/10 07/12/11 Yes Laurey Morale, MD  atorvastatin (LIPITOR) 40 MG tablet Take 40 mg by mouth daily.     Yes Historical Provider, MD  calcium carbonate (OS-CAL - DOSED IN MG OF ELEMENTAL CALCIUM) 1250 MG tablet Take 2 tablets by mouth daily.   Yes Historical Provider, MD  carbidopa-levodopa (SINEMET CR) 25-100 MG per tablet Take 1 tablet by mouth 3 (three) times daily.     Yes Historical Provider, MD  clopidogrel (PLAVIX) 75 MG tablet Take 75 mg by mouth daily.   Yes Historical Provider, MD  ezetimibe (ZETIA) 10 MG tablet Take 10 mg by mouth daily.     Yes Historical Provider, MD  Ferrous Sulfate (IRON) 325 (65 FE) MG TABS Take 1 tablet by mouth daily.    Yes Historical Provider, MD  Fluticasone-Salmeterol (ADVAIR DISKUS) 250-50 MCG/DOSE AEPB Inhale 1 puff into the lungs every 12 (twelve) hours.     Yes Historical Provider, MD  folic acid (FOLVITE) 800 MCG tablet Take 800 mcg by mouth daily.  Yes Historical Provider, MD  furosemide (LASIX) 40 MG tablet Take 2 tablets in the am and 1 tablet in the pm daily by mouth 02/27/11  Yes Laurey Morale, MD  Insulin Aspart (NOVOLOG [place]) Inject into the skin continuous. Via pump as directed    Yes Historical Provider, MD  isosorbide mononitrate (IMDUR) 60 MG 24 hr tablet Take 1 tablet (60 mg total) by mouth daily. 01/31/11  Yes Laurey Morale, MD  LamoTRIgine (LAMICTAL ODT PO) Take 200 mg by mouth daily.    Yes Historical Provider, MD  magnesium oxide (MAG-OX) 400 MG tablet Take 800 mg by mouth 2 (two) times daily.    Yes Historical Provider, MD  Multiple Vitamin (MULITIVITAMIN WITH MINERALS) TABS Take 1 tablet by mouth daily.   Yes Historical Provider, MD  mycophenolate (CELLCEPT) 500 MG tablet Take  500 mg by mouth 2 (two) times daily.    Yes Historical Provider, MD  oxyCODONE (OXY IR/ROXICODONE) 5 MG immediate release tablet Take 1 tablet by mouth 2 (two) times daily as needed. For pain 12/14/10  Yes Historical Provider, MD  pantoprazole (PROTONIX) 40 MG tablet Take 40 mg by mouth 2 (two) times daily.    Yes Historical Provider, MD  predniSONE (DELTASONE) 5 MG tablet Take 5 mg by mouth daily.     Yes Historical Provider, MD  pyridostigmine (MESTINON) 60 MG tablet Take 60 mg by mouth 3 (three) times daily.   Yes Historical Provider, MD  risperiDONE (RISPERDAL) 2 MG tablet Take 1 tablet by mouth Daily.   Yes Historical Provider, MD  saxagliptin HCl (ONGLYZA) 5 MG TABS tablet Take 5 mg by mouth daily.     Yes Historical Provider, MD  tacrolimus (PROGRAF) 5 MG capsule Take 5 mg by mouth 2 (two) times daily.     Yes Historical Provider, MD  Tamsulosin HCl (FLOMAX) 0.4 MG CAPS Take 0.4 mg by mouth daily.    Yes Historical Provider, MD  temazepam (RESTORIL) 15 MG capsule Take 15 mg by mouth at bedtime as needed. For sleep   Yes Historical Provider, MD  tiotropium (SPIRIVA HANDIHALER) 18 MCG inhalation capsule Place 1 capsule (18 mcg total) into inhaler and inhale daily. 02/17/11 02/17/12 Yes Lupita Leash, MD  Vilazodone HCl (VIIBRYD) 40 MG TABS Take 20 mg by mouth daily.    Yes Historical Provider, MD  vitamin B-12 (CYANOCOBALAMIN) 500 MCG tablet Take 500 mcg by mouth daily.   Yes Historical Provider, MD  VITAMIN D, CHOLECALCIFEROL, PO Take 10,000 Units by mouth 3 (three) times a week. On Monday, Wednesday, and Friday   Yes Historical Provider, MD  clobetasol (OLUX) 0.05 % topical foam Apply 1 application topically daily as needed. For head 12/12/10   Historical Provider, MD   Allergies No Known Allergies  Family History Family History  Problem Relation Age of Onset  . Coronary artery disease Mother   . Valvular heart disease Father   . Hepatitis      sibling  . Coronary artery disease  Sister   . Heart attack Sister   . Tuberculosis Father     Social History  reports that he quit smoking about 33 years ago. His smoking use included Cigarettes. He has a 40 pack-year smoking history. He has never used smokeless tobacco. He reports that he does not drink alcohol or use illicit drugs.  Review Of Systems  11 points review of systems is negative with an exception of listed in HPI.  Vital Signs: Temp:  [98.2  F (36.8 C)-98.9 F (37.2 C)] 98.3 F (36.8 C) (03/28 0559) Pulse Rate:  [71-98] 98  (03/28 0559) Resp:  [18-27] 18  (03/28 0559) BP: (102-151)/(38-82) 151/58 mmHg (03/28 0559) SpO2:  [89 %-93 %] 92 % (03/28 0559) FiO2 (%):  [32 %] 32 % (03/27 2016) I/O last 3 completed shifts: In: 1800 [P.O.:1800] Out: 1275 [Urine:1275]  Physical Examination: General:  C/o pain.  In no distress Neuro:  Moves all 4s, alert Ox3   HEENT:  No lesions, dry mucus membranes Neck:  Mild JVD   Cardiovascular:  RRR nl s1 s2 no s3 Lungs:  Distant BS, no wheeze Abdomen:  Distended, BSA Musculoskeletal:  FROM Skin:  Clear except for scattered bruises   Labs and Imaging:  Reviewed.  Please refer to the Assessment and Plan section for relevant results.  Assessment and Plan: Patient Active Hospital Problem List: Herniated nucleus pulposus (05/16/2011)   Assessment: s/p surgical intervention   Plan: per neurosurg  CORONARY ATHEROSLERO UNSPEC TYPE BYPASS GRAFT (11/16/2009)   Assessment: no post op ischemia   Plan: per cardiology/ [facility]  Atrial fibrillation (05/26/2009)   Assessment: rate controlled   Plan: cont home meds  Chronic diastolic heart failure (05/26/2009)   Assessment: volume excess   Plan: agree with lasix per cards  PVD WITH CLAUDICATION (11/01/2009)   Assessment: stable   Plan: no changes  COPD (chronic obstructive pulmonary disease) (08/18/2010)   Assessment: Golds D oxygen dependent   Plan: cont O2, pulm toilet, advair, spiriva  Obstipation postop Assessment:  needs bowel program d/t narcs Plan: bowel program   Shan Levans  M.D. Pulmonary and Critical Care Medicine [facility] Cell: [phone]   Pager: [phone] 05/18/2011, 8:08 AM

## 2011-05-18 NOTE — Plan of Care (Signed)
Problem: Consults Goal: Diagnosis - Spinal Surgery Outcome: Completed/Met Date Met:  05/18/11 Microdiscectomy     

## 2011-05-18 NOTE — Progress Notes (Signed)
Physical Therapy Treatment Patient Details Name: John [name] MRN: [id number] DOB: 1940-04-26 Today's Date: 05/18/2011  PT Assessment/Plan  PT - Assessment/Plan Comments on Treatment Session: Pt limited today due to cognition and decreased participation. Attempted to sit patient EOB in order to increase arousal however patient still lethargic with decreased O2 sats.  PT Plan: Discharge plan remains appropriate PT Frequency: Min 5X/week Follow Up Recommendations: Home health PT;Supervision/Assistance - 24 hour;Other (comment) (Pt refusing SNF however patient not making progress) Equipment Recommended: Defer to next venue PT Goals  Acute Rehab PT Goals PT Goal: Rolling Supine to Left Side - Progress: Not progressing PT Goal: Supine/Side to Sit - Progress: Not progressing PT Goal: Sit to Supine/Side - Progress: Not progressing  PT Treatment Precautions/Restrictions  Precautions Precautions: Fall;Back Precaution Booklet Issued: No Precaution Comments: Pt unable to verbalize precautions Required Braces or Orthoses: No Restrictions Weight Bearing Restrictions: No Mobility (including Balance) Bed Mobility Bed Mobility: Yes Rolling Right: With rail;2: Max assist Rolling Right Details (indicate cue type and reason): Max verbal and manual cues for sequencing and to adhere to back precautions. Rolling Left: With rail;2: Max assist Rolling Left Details (indicate cue type and reason): A to bend knees and initiate rolling at hips and shouders. Pt able to reach for rail.  Right Sidelying to Sit: 1: +2 Total assist;Patient percentage (comment) (20%) Right Sidelying to Sit Details (indicate cue type and reason): +2 assist to support trunk and legs OOB while adhering to back precautions.  Pt resisting transition from sidelying to sit. Left Sidelying to Sit: 1: +2 Total assist;Patient percentage (comment);With rails (p=20%) Left Sidelying to Sit Details (indicate cue type and reason): A required  for all aspects of transfer. Pt became resistance with LE positioning. A to control trunk from posterior lean as patient tends to lean back onto support.  Sitting - Scoot to Edge of Bed: 1: +2 Total assist;Other (comment) (p=20%) Sitting - Scoot to Edge of Bed Details (indicate cue type and reason): A with use of pad Sit to Supine: 1: +2 Total assist;Patient percentage (comment) (p=0%) Sit to Supine - Details (indicate cue type and reason): A for all aspects. Cues for technique and sequency.   Static Sitting Balance Static Sitting - Balance Support: Bilateral upper extremity supported;Feet supported Static Sitting - Level of Assistance: 1: +2 Total assist;Patient percentage (comment) (p=40%) Static Sitting - Comment/# of Minutes: ~10 minutes of sitting EOB in attempt to increase arousal. Pt lethargic and with poor participation. A to shift weight towards L side and to postioning LEs with sitting.  Exercise    End of Session PT - End of Session Activity Tolerance: Treatment limited secondary to medical complications (Comment);Patient limited by pain;Patient limited by fatigue (O2 @ 80-85% on %L) Patient left: in bed;with call bell in reach;with family/visitor present Nurse Communication: Mobility status for transfers General Behavior During Session: Lethargic Cognition: Impaired Cognitive Impairment: Pt with decreased arousal and attention this session. Poor participation. RN stated that patient did not appear this way until after morphine was given.   Fredrich Birks 05/18/2011, 11:53 AM 05/18/2011 Fredrich Birks PTA [phone] pager [phone] office

## 2011-05-18 NOTE — Consult Note (Signed)
[facility] - Consult F/U Note [place] TEAM 1 - Stepdown/ICU TEAM  Subjective: 71 y/o CM with Diabetes (on insulin pump at home), COPD, CAD (s/p CABG) , HPL, HTN, CKD s/p renal transplant x 2 and lumbar degenerative disc disease. He presented to the hospital for elective surgery given his worsening radiculopathy.  Quite sleepy when I entered the room. When he awakens, he appears confused. Per RN, he was in a great deal of pain last night and has been receiving Morphine constantly through the night.   Objective: Weight change:   Intake/Output Summary (Last 24 hours) at 05/18/11 1719 Last data filed at 05/18/11 0900  Gross per 24 hour  Intake    360 ml  Output    275 ml  Net     85 ml   Blood pressure 132/59, pulse 87, temperature 99.4 F (37.4 C), temperature source Oral, resp. rate 18, height 6\' 2"  (1.88 m), weight 105.4 kg (232 lb 5.8 oz), SpO2 87.00%.  CBG (last 3)   Basename 05/18/11 1618 05/18/11 0659 05/18/11 0345  GLUCAP 88 96 111*   Physical Exam: General: No acute respiratory distress Lungs: Clear to auscultation bilaterally without wheezes or crackles but BS are quite distant B Cardiovascular: Regular rate without murmur gallop or rub normal S1 and S2 Abdomen: Nontender, nondistended, soft, bowel sounds positive, no rebound, no ascites, no appreciable mass Extremities: No significant cyanosis, clubbing, or edema bilateral lower extremities  Lab Results:  Basename 05/16/11 1639  HGBA1C 6.7*    Micro Results: Recent Results (from the past 240 hour(s))  SURGICAL PCR SCREEN     Status: Normal   Collection Time   05/10/11  1:34 PM      Component Value Range Status Comment   MRSA, PCR NEGATIVE  NEGATIVE  Final    Staphylococcus aureus NEGATIVE  NEGATIVE  Final     Studies/Results: All recent x-ray/radiology reports have been reviewed in detail.   Medications: I have reviewed the patient's complete medication list.  Recommendations:  DM Pt on an  insulin pump managed by Dr. Leslie Dales in outpt setting - CBG well controlled at this time - A1c is 6.7 - cont to allow pt to manage his DM using his insulin pump - no need to adjust his algorithm at this time  Gold Stage IV COPD  on Advair /Spiriva/Oxygen chronically - followed by Dr. Delford Field in outpt setting - clinically well compensated at present  Chronic loculated right>left pleural effusion right thoracentesis (6/12), cytology negative for malignancy  Hx of Afib Rate controlled - sinus to ascultation today - appears this was simply a short therm post-op issue - is not on chronic anticoag  Chronic diastolic CHF Preserved EF by most recent echo (6/12) - appears euvolemic at present  HTN Well controlled at present   PAD atherectomy right SFA and right popliteal on 01/12/10 - complicated by left groin pseudoaneurysm  CKD s/p renal transplant 2002  Hx of CAD s/p CABG x5 Jan 2011  Dispo We will continue to follow along with you  Calvert Cantor, MD [facility] Office  [phone] Pager [phone]  On-Call/Text Page:      [email or web address]      password [facility]

## 2011-05-19 ENCOUNTER — Inpatient Hospital Stay (HOSPITAL_COMMUNITY): Payer: Medicare Other

## 2011-05-19 DIAGNOSIS — E119 Type 2 diabetes mellitus without complications: Secondary | ICD-10-CM | POA: Diagnosis present

## 2011-05-19 DIAGNOSIS — Z9641 Presence of insulin pump (external) (internal): Secondary | ICD-10-CM

## 2011-05-19 DIAGNOSIS — I509 Heart failure, unspecified: Secondary | ICD-10-CM

## 2011-05-19 DIAGNOSIS — I5033 Acute on chronic diastolic (congestive) heart failure: Secondary | ICD-10-CM | POA: Diagnosis not present

## 2011-05-19 LAB — BASIC METABOLIC PANEL
BUN: 29 mg/dL — ABNORMAL HIGH (ref 6–23)
BUN: 29 mg/dL — ABNORMAL HIGH (ref 6–23)
CO2: 29 mEq/L (ref 19–32)
CO2: 29 mEq/L (ref 19–32)
Chloride: 100 mEq/L (ref 96–112)
Chloride: 97 mEq/L (ref 96–112)
Creatinine, Ser: 1.43 mg/dL — ABNORMAL HIGH (ref 0.50–1.35)
Creatinine, Ser: 1.33 mg/dL (ref 0.50–1.35)
GFR calc Af Amer: 60 mL/min — ABNORMAL LOW (ref >90)
Glucose, Bld: 92 mg/dL (ref 70–99)
Potassium: 4.5 mEq/L (ref 3.5–5.1)

## 2011-05-19 LAB — GLUCOSE, CAPILLARY
Glucose-Capillary: 104 mg/dL — ABNORMAL HIGH (ref 70–99)
Glucose-Capillary: 130 mg/dL — ABNORMAL HIGH (ref 70–99)

## 2011-05-19 LAB — EXPECTORATED SPUTUM ASSESSMENT W GRAM STAIN, RFLX TO RESP C

## 2011-05-19 MED ORDER — FUROSEMIDE 10 MG/ML IJ SOLN
60.0000 mg | Freq: Two times a day (BID) | INTRAMUSCULAR | Status: DC
  Administered 2011-05-19 – 2011-05-20 (×2): 60 mg via INTRAVENOUS
  Filled 2011-05-19 (×4): qty 6

## 2011-05-19 MED ORDER — LEVALBUTEROL HCL 0.63 MG/3ML IN NEBU
0.6300 mg | INHALATION_SOLUTION | Freq: Four times a day (QID) | RESPIRATORY_TRACT | Status: DC
  Administered 2011-05-20 – 2011-05-21 (×7): 0.63 mg via RESPIRATORY_TRACT
  Filled 2011-05-19 (×11): qty 3

## 2011-05-19 NOTE — Progress Notes (Signed)
Clinical Social Work Department BRIEF PSYCHOSOCIAL ASSESSMENT 05/19/2011  Patient:  John [name], John [name]     Account Number:  [id number]     Admit date:  05/16/2011  Clinical Social Worker:  Mee Hives  Date/Time:  05/19/2011 04:00 PM  Referred by:  Physician  Date Referred:  05/19/2011 Referred for  SNF Placement   Other Referral:   Interview type:  Family Other interview type:    PSYCHOSOCIAL DATA Living Status:  WIFE Admitted from facility:   Level of care:   Primary support name:   Primary support relationship to patient:  SPOUSE Degree of support available:   strong    CURRENT CONCERNS Current Concerns  Post-Acute Placement   Other Concerns:    SOCIAL WORK ASSESSMENT / PLAN Pt wife agreeable to SNF placement, but prefers CIR. Understands CIR may not be appropriate at this time. CSW sent facilities list to pt wife and initiated SNF search. O2 requirements will be a barrier to d/c at this time. Will follow.   Assessment/plan status:  Other - See comment Other assessment/ plan:   SNf placement, when medically ready   Information/referral to community resources:    PATIENT'S/FAMILY'S RESPONSE TO PLAN OF CARE: Pt wife very appreciative

## 2011-05-19 NOTE — Progress Notes (Signed)
Name: John [name] MRN: [id number] DOB: 12-24-1940    LOS: 3  PCCM PROGESS  NOTE  History of Present Illness: 71 y.o. WM s/p lum lam single level.  Complex Med Hx includes Gold D COPD oxygen dependent.  PWright follows in office  Lines / Drains: none  Cultures: BC x 2 3/28 UC 3/29 Sputum c/s 3/29  Antibiotics: none  Tests / Events: none    Past Medical History  Diagnosis Date  . CAD (coronary artery disease)     a. myoview 1/11: lg inf fixed defect;   b. cath 1/11: 3v CAD,  c. s/p CABG 1/11 c/b acute renal failure and AFib  . Atrial fibrillation   . Chronic diastolic heart failure     a. echo 4/11: EF 45-50%, mod LVH, LAE, inf and septal HK  . First degree AV block   . Orthostatic hypotension     pyridostigmine  . History of renal transplant   . DM2 (diabetes mellitus, type 2)   . Diabetic gastroparesis   . HTN (hypertension)   . HLD (hyperlipidemia)   . PAD (peripheral artery disease)     s/p R SFA-Pop atherectomy 11/11  . Prostate cancer   . ED (erectile dysfunction)   . DDD (degenerative disc disease), lumbar   . Tremor   . Obesity   . Low testosterone   . Chronic kidney disease     s/p renal transplant 2002  . Pleural effusion, right     loculated  . COPD (chronic obstructive pulmonary disease)   . Anxiety   . Diabetes mellitus     insulin pump   Past Surgical History  Procedure Date  . Coronary artery bypass graft 03/2009  . Cataract extraction   . Vitrectomy   . Tonsillectomy   . Kidney transplant 2002  . Hernia repair   . Prostate cancer rx 2006 brachytherapy   . Prior peritoneal catheter placement   . Lumbar laminectomy/decompression microdiscectomy 05/16/2011    Procedure: LUMBAR LAMINECTOMY/DECOMPRESSION MICRODISCECTOMY 1 LEVEL;  Surgeon: Barnett Abu, MD;  Location: [facility];  Service: Neurosurgery;  Laterality: Bilateral;  Bilateral Lumbar Five-Sacral One Laminectomy   Prior to Admission medications   Medication Sig Start Date  End Date Taking? Authorizing Provider  albuterol (PROVENTIL HFA;VENTOLIN HFA) 108 (90 BASE) MCG/ACT inhaler Inhale 2 puffs into the lungs every 6 (six) hours as needed for wheezing. 08/26/10 08/26/11 Yes Nyoka Cowden, MD  aspirin EC 81 MG tablet Take 1 tablet (81 mg total) by mouth daily. 07/12/10 07/12/11 Yes Laurey Morale, MD  atorvastatin (LIPITOR) 40 MG tablet Take 40 mg by mouth daily.     Yes Historical Provider, MD  calcium carbonate (OS-CAL - DOSED IN MG OF ELEMENTAL CALCIUM) 1250 MG tablet Take 2 tablets by mouth daily.   Yes Historical Provider, MD  carbidopa-levodopa (SINEMET CR) 25-100 MG per tablet Take 1 tablet by mouth 3 (three) times daily.     Yes Historical Provider, MD  clopidogrel (PLAVIX) 75 MG tablet Take 75 mg by mouth daily.   Yes Historical Provider, MD  ezetimibe (ZETIA) 10 MG tablet Take 10 mg by mouth daily.     Yes Historical Provider, MD  Ferrous Sulfate (IRON) 325 (65 FE) MG TABS Take 1 tablet by mouth daily.    Yes Historical Provider, MD  Fluticasone-Salmeterol (ADVAIR DISKUS) 250-50 MCG/DOSE AEPB Inhale 1 puff into the lungs every 12 (twelve) hours.     Yes Historical Provider, MD  folic acid (FOLVITE)  800 MCG tablet Take 800 mcg by mouth daily.   Yes Historical Provider, MD  furosemide (LASIX) 40 MG tablet Take 2 tablets in the am and 1 tablet in the pm daily by mouth 02/27/11  Yes Laurey Morale, MD  Insulin Aspart (NOVOLOG [place]) Inject into the skin continuous. Via pump as directed    Yes Historical Provider, MD  isosorbide mononitrate (IMDUR) 60 MG 24 hr tablet Take 1 tablet (60 mg total) by mouth daily. 01/31/11  Yes Laurey Morale, MD  LamoTRIgine (LAMICTAL ODT PO) Take 200 mg by mouth daily.    Yes Historical Provider, MD  magnesium oxide (MAG-OX) 400 MG tablet Take 800 mg by mouth 2 (two) times daily.    Yes Historical Provider, MD  Multiple Vitamin (MULITIVITAMIN WITH MINERALS) TABS Take 1 tablet by mouth daily.   Yes Historical Provider, MD  mycophenolate  (CELLCEPT) 500 MG tablet Take 500 mg by mouth 2 (two) times daily.    Yes Historical Provider, MD  oxyCODONE (OXY IR/ROXICODONE) 5 MG immediate release tablet Take 1 tablet by mouth 2 (two) times daily as needed. For pain 12/14/10  Yes Historical Provider, MD  pantoprazole (PROTONIX) 40 MG tablet Take 40 mg by mouth 2 (two) times daily.    Yes Historical Provider, MD  predniSONE (DELTASONE) 5 MG tablet Take 5 mg by mouth daily.     Yes Historical Provider, MD  pyridostigmine (MESTINON) 60 MG tablet Take 60 mg by mouth 3 (three) times daily.   Yes Historical Provider, MD  risperiDONE (RISPERDAL) 2 MG tablet Take 1 tablet by mouth Daily.   Yes Historical Provider, MD  saxagliptin HCl (ONGLYZA) 5 MG TABS tablet Take 5 mg by mouth daily.     Yes Historical Provider, MD  tacrolimus (PROGRAF) 5 MG capsule Take 5 mg by mouth 2 (two) times daily.     Yes Historical Provider, MD  Tamsulosin HCl (FLOMAX) 0.4 MG CAPS Take 0.4 mg by mouth daily.    Yes Historical Provider, MD  temazepam (RESTORIL) 15 MG capsule Take 15 mg by mouth at bedtime as needed. For sleep   Yes Historical Provider, MD  tiotropium (SPIRIVA HANDIHALER) 18 MCG inhalation capsule Place 1 capsule (18 mcg total) into inhaler and inhale daily. 02/17/11 02/17/12 Yes Lupita Leash, MD  Vilazodone HCl (VIIBRYD) 40 MG TABS Take 20 mg by mouth daily.    Yes Historical Provider, MD  vitamin B-12 (CYANOCOBALAMIN) 500 MCG tablet Take 500 mcg by mouth daily.   Yes Historical Provider, MD  VITAMIN D, CHOLECALCIFEROL, PO Take 10,000 Units by mouth 3 (three) times a week. On Monday, Wednesday, and Friday   Yes Historical Provider, MD  clobetasol (OLUX) 0.05 % topical foam Apply 1 application topically daily as needed. For head 12/12/10   Historical Provider, MD   Allergies No Known Allergies  Family History Family History  Problem Relation Age of Onset  . Coronary artery disease Mother   . Valvular heart disease Father   . Hepatitis       sibling  . Coronary artery disease Sister   . Heart attack Sister   . Tuberculosis Father     Social History  reports that he quit smoking about 33 years ago. His smoking use included Cigarettes. He has a 40 pack-year smoking history. He has never used smokeless tobacco. He reports that he does not drink alcohol or use illicit drugs.  Review Of Systems  11 points review of systems is negative with an  exception of listed in HPI.  Vital Signs: Temp:  [98.6 F (37 C)-100.1 F (37.8 C)] 99 F (37.2 C) (03/29 0600) Pulse Rate:  [75-92] 89  (03/29 0600) Resp:  [18-22] 18  (03/29 0600) BP: (111-150)/(53-66) 123/63 mmHg (03/29 0600) SpO2:  [86 %-95 %] 92 % (03/29 0600) FiO2 (%):  [40 %] 40 % (03/29 0600) Weight:  [109.6 kg (241 lb 10 oz)] 109.6 kg (241 lb 10 oz) (03/29 0600) I/O last 3 completed shifts: In: 690 [P.O.:690] Out: 1175 [Urine:1175]  Physical Examination: General:  C/o pain.  In no distress Neuro:  Moves all 4s, alert Ox3   HEENT:  No lesions, dry mucus membranes Neck:  Mild JVD   Cardiovascular:  RRR nl s1 s2 no s3 Lungs:  Distant BS, no wheeze, scattered rhonchi Abdomen:  Distended, BSA Musculoskeletal:  FROM Skin:  Clear except for scattered bruises    FiO2 (%):  [40 %] 40 % VM  Labs and Imaging:  Reviewed.  Please refer to the Assessment and Plan section for relevant results. Dg Chest 2 View  05/18/2011  *RADIOLOGY REPORT*  Clinical Data: Pneumonia.  Edema.  CHEST - 2 VIEW  Comparison: 05/10/2011.  Findings: Interval enlargement of the cardiac silhouette with interval airspace opacity throughout the majority of both lungs. Decreased right lateral pleural fluid.  Stable post CABG changes.  IMPRESSION:  1.  Interval cardiomegaly and changes of acute congestive heart failure with diffuse alveolar edema. 2.  Decreased right pleural fluid.  Original Report Authenticated By: Darrol Angel, M.D.   Assessment and Plan: Patient Active Hospital Problem List: Herniated  nucleus pulposus (05/16/2011)   Assessment: s/p surgical intervention   Plan: per neurosurg  CORONARY ATHEROSLERO UNSPEC TYPE BYPASS GRAFT (11/16/2009)   Assessment: no post op ischemia   Plan: per cardiology/ [facility]  Atrial fibrillation (05/26/2009)   Assessment: rate controlled   Plan: cont home meds  Acute on Chronic diastolic heart failure (05/26/2009)   Assessment: volume excess   Plan: agree with lasix per cards, watch Cr   PVD WITH CLAUDICATION (11/01/2009)   Assessment: stable   Plan: no changes  COPD (chronic obstructive pulmonary disease) (08/18/2010)   Assessment: Golds D oxygen dependent   Plan: cont O2, pulm toilet, advair, spiriva, add Flutter valve, add Xopenex nebs  Obstipation postop Assessment: needs bowel program d/t narcs Plan: bowel program  Delirium: On risperdal Pain management challenging   Acute respiratory failure Assessment: d/t pulm edema and ATX Plan More lasix, incentive spiro, flutter , BD nebs, titrate oxygen, may need SDU tfr   Renal transplant Assessment:  Cr up.   Plan Measure tacrolimus levels,  Monitor bmp daily, may need renal consultation  Shan Levans  M.D. Pulmonary and Critical Care Medicine [facility] Cell: [phone]   Pager: [phone] 05/19/2011, 10:11 AM

## 2011-05-19 NOTE — Progress Notes (Signed)
Medicine Consult  PCP: Hoyle Sauer, MD, MD Person Requesting Consult: Dr. Danielle Dess Reason for Consult: Management of Diabetes  Brief HPI:  71 y/o CM with Diabetes (on insulin pump at home), COPD, CAD (s/p CABG) , HPL, HTN, CKD s/p renal transplant x 2 and lumbar degenerative disc disease. He presented to the hospital for elective surgery given his worsening radiculopathy. He underwent laminectomy and decompression of L5 S1.  Past medical history:  Past Medical History   Diagnosis  Date   .  CAD (coronary artery disease)      a. myoview 1/11: lg inf fixed defect; b. cath 1/11: 3v CAD, c. s/p CABG 1/11 c/b acute renal failure and AFib   .  Atrial fibrillation    .  Chronic diastolic heart failure      a. echo 4/11: EF 45-50%, mod LVH, LAE, inf and septal HK   .  First degree AV block    .  Orthostatic hypotension      pyridostigmine   .  History of renal transplant    .  DM2 (diabetes mellitus, type 2)    .  Diabetic gastroparesis    .  HTN (hypertension)    .  HLD (hyperlipidemia)    .  PAD (peripheral artery disease)      s/p R SFA-Pop atherectomy 11/11   .  Prostate cancer    .  ED (erectile dysfunction)    .  DDD (degenerative disc disease), lumbar    .  Tremor    .  Obesity    .  Low testosterone    .  Chronic kidney disease      s/p renal transplant 2002   .  Pleural effusion, right      loculated   .  COPD (chronic obstructive pulmonary disease)    .  Anxiety    .  Diabetes mellitus      insulin pump      Subjective: Patient somewhat sleepy but arousable. Slow to respond. Mildly confused. Denies any significant pain. Denies shortness of breath.  Objective: Vital signs in last 24 hours: Temp:  [98.6 F (37 C)-100.4 F (38 C)] 100.4 F (38 C) (03/29 1000) Pulse Rate:  [75-99] 99  (03/29 1000) Resp:  [18-22] 18  (03/29 1000) BP: (111-150)/(53-66) 131/61 mmHg (03/29 1000) SpO2:  [86 %-95 %] 90 % (03/29 1000) FiO2 (%):  [40 %] 40 % (03/29 1000) Weight:   [109.6 kg (241 lb 10 oz)] 109.6 kg (241 lb 10 oz) (03/29 0600) Weight change:  Last BM Date: 05/15/11  Intake/Output from previous day: 03/28 0701 - 03/29 0700 In: 690 [P.O.:690] Out: 900 [Urine:900] Intake/Output this shift: Total I/O In: -  Out: 300 [Urine:300]  General appearance: alert, distracted, no distress and slowed mentation Resp: crackles at bases. no wheezing. Cardio: regular rate and rhythm, S1, S2 normal, no murmur, click, rub or gallop GI: soft, non-tender; bowel sounds normal; no masses,  no organomegaly Extremities: extremities normal, atraumatic, no cyanosis or edema  Lab Results:  [facility] 05/18/11 0610  WBC 12.1*  HGB 9.3*  HCT 30.3*  PLT 143*   BMET  Basename 05/19/11 1050 05/19/11 0640  NA 135 137  K 4.5 4.6  CL 97 100  CO2 29 29  GLUCOSE 113* 92  BUN 29* 29*  CREATININE 1.33 1.43*  CALCIUM 9.6 9.7  ALT -- --    Studies/Results: Dg Chest 2 View  05/18/2011  *RADIOLOGY REPORT*  Clinical Data: Pneumonia.  Edema.  CHEST - 2 VIEW  Comparison: 05/10/2011.  Findings: Interval enlargement of the cardiac silhouette with interval airspace opacity throughout the majority of both lungs. Decreased right lateral pleural fluid.  Stable post CABG changes.  IMPRESSION:  1.  Interval cardiomegaly and changes of acute congestive heart failure with diffuse alveolar edema. 2.  Decreased right pleural fluid.  Original Report Authenticated By: Darrol Angel, M.D.   Dg Chest Port 1 View  05/19/2011  *RADIOLOGY REPORT*  Clinical Data: Hypoxia.  Pulmonary edema.  Coronary artery disease. Postop from back surgery.  PORTABLE CHEST - 1 VIEW  Comparison: 05/18/2011  Findings: Cardiomegaly stable.  Improved aeration of both lungs is seen.  Pulmonary interstitial edema pattern is not simply changed allowing for increased lung volumes.  Probable small bilateral pleural effusions are noted.  IMPRESSION:  1.  Improved aeration of both lungs. 2.  No significant change in diffuse  interstitial edema and cardiomegaly.  Probable small bilateral pleural effusions.  Original Report Authenticated By: Danae Orleans, M.D.    Medications:  Scheduled:   . aspirin EC  81 mg Oral Daily  . atorvastatin  40 mg Oral q1800  . calcium carbonate  2 tablet Oral Daily  . carbidopa-levodopa  1 tablet Oral TID  . docusate sodium  100 mg Oral BID  . ezetimibe  10 mg Oral Daily  . Fluticasone-Salmeterol  1 puff Inhalation Q12H  . furosemide  60 mg Intravenous BID  . insulin pump   Subcutaneous TID AC, HS, 0200  . isosorbide mononitrate  60 mg Oral Daily  . lamoTRIgine  200 mg Oral Daily  . levalbuterol  0.63 mg Nebulization Q6H  . linagliptin  5 mg Oral Daily  . magnesium oxide  800 mg Oral BID  . mycophenolate  500 mg Oral BID  . pantoprazole  40 mg Oral BID  . predniSONE  5 mg Oral Daily  . pyridostigmine  60 mg Oral TID  . risperiDONE  2 mg Oral QHS  . senna  1 tablet Oral BID  . sodium chloride  3 mL Intravenous Q12H  . tacrolimus  5 mg Oral BID  . Tamsulosin HCl  0.4 mg Oral Daily  . tiotropium  18 mcg Inhalation Daily  . Vilazodone HCl  20 mg Oral Daily  . DISCONTD: furosemide  40 mg Intravenous BID  . DISCONTD: furosemide  40 mg Oral QPM  . DISCONTD: furosemide  80 mg Oral Q breakfast    Assessment/Plan:  Principal Problem:  *Herniated nucleus pulposus Active Problems:  HYPERLIPIDEMIA-MIXED  CORONARY ATHEROSLERO UNSPEC TYPE BYPASS GRAFT  Atrial fibrillation  Chronic diastolic heart failure  PVD WITH CLAUDICATION  COPD (chronic obstructive pulmonary disease)  Spondylosis  Diastolic CHF, acute on chronic    DM  Pt on an insulin pump managed by Dr. Leslie Dales in outpt setting - CBG well controlled at this time - A1c is 6.7 - cont to allow pt to manage his DM using his insulin pump - no need to adjust his algorithm at this time. Have encouraged patient to improve his oral intake.   Acute diastolic CHF with Hypoxic Resp failure Appears to have become acutely  decompensated as of 2 days ago and has improved some with Lasix. Cards appears to be managing. CXR shows some improvement.   Fever Likely due to atelectasis. Continue to monitor.  HTN  Well controlled at present  CKD s/p renal transplant 2002  Recommend monitoring renal function closely. Involve Nephrology if creatinine rises.  Gold Stage IV COPD  on Advair /Spiriva/Oxygen chronically - followed by Dr. Delford Field in outpt setting. Pulmonology is following.  Chronic loculated right>left pleural effusion  right thoracentesis (6/12), cytology negative for malignancy   Hx of Afib  Rate controlled. Is not on chronic anticoagulation.  PAD  atherectomy right SFA and right popliteal on 01/12/10 - complicated by left groin pseudoaneurysm   Hx of CAD s/p CABG x5 Jan 2011  Stable  We will continue to follow along with you. Thanks.    LOS: 3 days   [facility] Pager [phone] 05/19/2011, 2:16 PM

## 2011-05-19 NOTE — Progress Notes (Signed)
SUBJECTIVE: More awake/alert this am (did not get pain meds overnight) but severe pain in his legs bilaterally.  No dyspnea at rest.  Now on Venturi mask.  CXR with CHF.      Marland Kitchen aspirin EC  81 mg Oral Daily  . atorvastatin  40 mg Oral q1800  . calcium carbonate  2 tablet Oral Daily  . carbidopa-levodopa  1 tablet Oral TID  . docusate sodium  100 mg Oral BID  . ezetimibe  10 mg Oral Daily  . Fluticasone-Salmeterol  1 puff Inhalation Q12H  . furosemide  40 mg Intravenous BID  . insulin pump   Subcutaneous TID AC, HS, 0200  . isosorbide mononitrate  60 mg Oral Daily  . lamoTRIgine  200 mg Oral Daily  . linagliptin  5 mg Oral Daily  . magnesium oxide  800 mg Oral BID  . mycophenolate  500 mg Oral BID  . pantoprazole  40 mg Oral BID  . predniSONE  5 mg Oral Daily  . pyridostigmine  60 mg Oral TID  . risperiDONE  2 mg Oral QHS  . senna  1 tablet Oral BID  . sodium chloride  3 mL Intravenous Q12H  . tacrolimus  5 mg Oral BID  . Tamsulosin HCl  0.4 mg Oral Daily  . tiotropium  18 mcg Inhalation Daily  . Vilazodone HCl  20 mg Oral Daily  . DISCONTD: furosemide  40 mg Oral QPM  . DISCONTD: furosemide  80 mg Oral Q breakfast      Filed Vitals:   05/18/11 2123 05/18/11 2200 05/19/11 0200 05/19/11 0600  BP:  116/66 111/53 123/63  Pulse: 85 80 85 89  Temp:  99 F (37.2 C) 98.6 F (37 C) 99 F (37.2 C)  TempSrc:  Axillary    Resp:  22 18 18   Height:      Weight:    241 lb 10 oz (109.6 kg)  SpO2: 95% 95% 90% 92%    Intake/Output Summary (Last 24 hours) at 05/19/11 5284 Last data filed at 05/19/11 1324  Gross per 24 hour  Intake    690 ml  Output    900 ml  Net   -210 ml    LABS: Basic Metabolic Panel:  Basename 05/19/11 0640 05/18/11 0610  NA 137 137  K 4.6 4.6  CL 100 100  CO2 29 30  GLUCOSE 92 101*  BUN 29* 21  CREATININE 1.43* 1.07  CALCIUM 9.7 9.5  MG -- --  PHOS -- --   Liver Function Tests: No results found for this basename:  AST:2,ALT:2,ALKPHOS:2,BILITOT:2,PROT:2,ALBUMIN:2 in the last 72 hours No results found for this basename: LIPASE:2,AMYLASE:2 in the last 72 hours CBC:  Basename 05/18/11 0610  WBC 12.1*  NEUTROABS --  HGB 9.3*  HCT 30.3*  MCV 71.3*  PLT 143*   Cardiac Enzymes: No results found for this basename: CKTOTAL:3,CKMB:3,CKMBINDEX:3,TROPONINI:3 in the last 72 hours BNP: No components found with this basename: POCBNP:3 D-Dimer: No results found for this basename: DDIMER:2 in the last 72 hours Hemoglobin A1C:  Basename 05/16/11 1639  HGBA1C 6.7*   Fasting Lipid Panel: No results found for this basename: CHOL,HDL,LDLCALC,TRIG,CHOLHDL,LDLDIRECT in the last 72 hours Thyroid Function Tests: No results found for this basename: TSH,T4TOTAL,FREET3,T3FREE,THYROIDAB in the last 72 hours Anemia Panel: No results found for this basename: VITAMINB12,FOLATE,FERRITIN,TIBC,IRON,RETICCTPCT in the last 72 hours  RADIOLOGY: Dg Chest 2 View  05/18/2011  *RADIOLOGY REPORT*  Clinical Data: Pneumonia.  Edema.  CHEST - 2  VIEW  Comparison: 05/10/2011.  Findings: Interval enlargement of the cardiac silhouette with interval airspace opacity throughout the majority of both lungs. Decreased right lateral pleural fluid.  Stable post CABG changes.  IMPRESSION:  1.  Interval cardiomegaly and changes of acute congestive heart failure with diffuse alveolar edema. 2.  Decreased right pleural fluid.  Original Report Authenticated By: Darrol Angel, M.D.   Dg Chest 2 View  05/10/2011  *RADIOLOGY REPORT*  Clinical Data: 71 year old male.  Preoperative study.  History of pleural effusion, hypertension.  CHEST - 2 VIEW  Comparison: 01/24/2011 and earlier.  Findings: Chronic subpleural opacity greater on the right and posteriorly compatible with chronic loculated pleural effusions. Associated hazy bilateral basilar predominant pulmonary opacity. No significant change since 08/18/2010. Stable cardiomegaly and mediastinal contours.   Sequelae of CABG.  No pneumothorax or pulmonary edema.  IMPRESSION: Chronic loculated pleural effusions greater on the right.  Chronic basilar predominant pulmonary opacity. No superimposed acute findings are identified.  Original Report Authenticated By: Harley Hallmark, M.D.   Dg Lumbar Spine 1 View  05/16/2011  *RADIOLOGY REPORT*  Clinical Data: Intraoperative localization for lumbar surgery.  LUMBAR SPINE - 1 VIEW  Comparison: Lumbar spine films 04/13/2011.  Findings: A spinal needle is projecting at the L5-S1 disc space level.  IMPRESSION: L5-S1 marked intraoperatively.  Original Report Authenticated By: P. Loralie Champagne, M.D.    PHYSICAL EXAM  General: NAD  Neck: JVP 10-12 cm, no thyromegaly or thyroid nodule.  Lungs: Dependent crackles.  CV: Nondisplaced PMI. Heart regular S1/S2, no S3/S4, 1/6 SEM RUSB. 1+ edema 1/2 up lower legs bilaterally. No carotid bruit. Normal pedal pulses.  Abdomen: Soft, nontender, no hepatosplenomegaly, no distention.  Neurologic: alert, oriented to place Psych: Normal affect.  Extremities: No clubbing or cyanosis.   ASSESSMENT AND PLAN:  71 yo with history of renal transplant, DM, HTN, COPD on home oxygen, diastolic CHF, and CAD s/p CABG is now s/p back surgery. He is drowsy and delirious tonight.  1. Delirium: Post-operative in setting of pain meds. Also with volume overload. Improved this morning off pain meds but now with significant pain. 2. CHF: Volume overloaded on exam. Acute on chronic diastolic CHF. BNP elevated. CXR with CHF. - Started IV lasix last night.  Creatinine is up to 1.5 today.  Will need to monitor closely.   - Strict I/Os, daily weights.  3. ID: Low grade fever to 100.1 yesterday. ? Atelectasis. WBCs are mildly elevated. No PNA on CXR. Blood cultures done given immunocompromised (immunosuppressant use).   [name] 05/19/2011 8:25 AM

## 2011-05-19 NOTE — Progress Notes (Signed)
Pt in significant pain this morning.  OT educated pt in repositioning in bed, as well as deep breathing. Pt unable to get comfortable in bed.  Wife present and teary regarding pts status today. Pts wife aware pt will need SNF or CIR before DC home. Lise Auer, OT

## 2011-05-19 NOTE — Progress Notes (Signed)
Patient ID: John [name], male   DOB: [birth year], 71 y.o.   MRN: [id number] Sedated now passed pain medication. Earlier nurse notes the pain medication did not quiet patient's pain. He was given another dose and now is difficult to arouse. Has not ambulated today. Physical therapy has difficulty with maintaining patient sitting at the side of the bed.  Medically he appears to be decompensating with increased creatinine. He appears to have atelectasis by chest x-ray. Rapidly desaturates if off oxygen.  Neurologically he is stable with ability to move his distal lower extremities quite well. His incision is clean and dry. It is okay to limit his pain medication. This should help with his overall level of alertness.

## 2011-05-19 NOTE — Progress Notes (Signed)
Physical Therapy Treatment Patient Details Name: John [name] MRN: [id number] DOB: 08-Feb-1941 Today's Date: 05/19/2011  PT Assessment/Plan  PT - Assessment/Plan Comments on Treatment Session: pt lethargic, but agreeable to PT.  pt still requiring extensive A for all mobility and becomes SOB quickly.   PT Plan: Frequency remains appropriate;Discharge plan needs to be updated PT Frequency: Min 5X/week Follow Up Recommendations: Skilled nursing facility Equipment Recommended: Defer to next venue PT Goals  Acute Rehab PT Goals PT Goal: Rolling Supine to Left Side - Progress: Progressing toward goal PT Goal: Supine/Side to Sit - Progress: Progressing toward goal PT Goal: Sit to Supine/Side - Progress: Progressing toward goal  PT Treatment Precautions/Restrictions  Precautions Precautions: Fall;Back Precaution Booklet Issued: No Precaution Comments: Pt unable to verbalize precautions Required Braces or Orthoses: No Restrictions Weight Bearing Restrictions: No Mobility (including Balance) Bed Mobility Bed Mobility: Yes Rolling Right: 2: Max assist;With rail Rolling Right Details (indicate cue type and reason): cues for back precautions, log roll technique Rolling Left: 2: Max assist;With rail Right Sidelying to Sit: 1: +2 Total assist;Patient percentage (comment) (pt 20%) Right Sidelying to Sit Details (indicate cue type and reason): cues for back precautions, use of UEs, sequencing Sitting - Scoot to Edge of Bed: 2: Max assist Sit to Supine: 1: +2 Total assist;Patient percentage (comment) (pt 10%) Sit to Supine - Details (indicate cue type and reason): max cueing for back precautions, safe technique.   Transfers Transfers: No Ambulation/Gait Ambulation/Gait: No Stairs: No Wheelchair Mobility Wheelchair Mobility: No  Posture/Postural Control Posture/Postural Control: No significant limitations Balance Balance Assessed: Yes Static Sitting Balance Static Sitting - Balance  Support: Bilateral upper extremity supported;Feet supported Static Sitting - Level of Assistance: 2: Max assist Static Sitting - Comment/# of Minutes: pt leans hard to R side and posteriorly.  Attempted reaching L to work on trunk control, however pt fatigues quickly.   Exercise    End of Session PT - End of Session Activity Tolerance: Patient limited by fatigue Patient left: in bed;with call bell in reach Nurse Communication: Mobility status for transfers General Behavior During Session: Lethargic Cognition: Impaired  Sunny Schlein, [place] [phone] 05/19/2011, 1:38 PM

## 2011-05-20 ENCOUNTER — Inpatient Hospital Stay (HOSPITAL_COMMUNITY): Payer: Medicare Other

## 2011-05-20 DIAGNOSIS — I5032 Chronic diastolic (congestive) heart failure: Secondary | ICD-10-CM

## 2011-05-20 DIAGNOSIS — R4182 Altered mental status, unspecified: Secondary | ICD-10-CM | POA: Diagnosis not present

## 2011-05-20 DIAGNOSIS — I517 Cardiomegaly: Secondary | ICD-10-CM

## 2011-05-20 DIAGNOSIS — I4891 Unspecified atrial fibrillation: Secondary | ICD-10-CM

## 2011-05-20 LAB — CBC
MCH: 22.2 pg — ABNORMAL LOW (ref 26.0–34.0)
MCHC: 31.3 g/dL (ref 30.0–36.0)
Platelets: 151 10*3/uL (ref 150–400)
RDW: 18.5 % — ABNORMAL HIGH (ref 11.5–15.5)

## 2011-05-20 LAB — DIFFERENTIAL
Basophils Absolute: 0 10*3/uL (ref 0.0–0.1)
Basophils Relative: 0 % (ref 0–1)
Eosinophils Absolute: 0.1 10*3/uL (ref 0.0–0.7)
Neutro Abs: 8 10*3/uL — ABNORMAL HIGH (ref 1.7–7.7)
Neutrophils Relative %: 78 % — ABNORMAL HIGH (ref 43–77)

## 2011-05-20 LAB — HEPATIC FUNCTION PANEL
Albumin: 2.2 g/dL — ABNORMAL LOW (ref 3.5–5.2)
Alkaline Phosphatase: 121 U/L — ABNORMAL HIGH (ref 39–117)
Bilirubin, Direct: 0.3 mg/dL (ref 0.0–0.3)
Total Bilirubin: 0.8 mg/dL (ref 0.3–1.2)

## 2011-05-20 LAB — BLOOD GAS, ARTERIAL
Bicarbonate: 32.3 mEq/L — ABNORMAL HIGH (ref 20.0–24.0)
Drawn by: 275531
FIO2: 0.4 %
O2 Saturation: 97.7 %
Patient temperature: 98.6

## 2011-05-20 LAB — GLUCOSE, CAPILLARY
Glucose-Capillary: 181 mg/dL — ABNORMAL HIGH (ref 70–99)
Glucose-Capillary: 169 mg/dL — ABNORMAL HIGH (ref 70–99)

## 2011-05-20 LAB — URINALYSIS, MICROSCOPIC ONLY
Bilirubin Urine: NEGATIVE
Leukocytes, UA: NEGATIVE
Nitrite: NEGATIVE
Specific Gravity, Urine: 1.015 (ref 1.005–1.030)
pH: 5.5 (ref 5.0–8.0)

## 2011-05-20 LAB — BASIC METABOLIC PANEL
BUN: 37 mg/dL — ABNORMAL HIGH (ref 6–23)
Calcium: 9.6 mg/dL (ref 8.4–10.5)
GFR calc Af Amer: 59 mL/min — ABNORMAL LOW (ref >90)
GFR calc non Af Amer: 51 mL/min — ABNORMAL LOW (ref >90)
Glucose, Bld: 183 mg/dL — ABNORMAL HIGH (ref 70–99)

## 2011-05-20 MED ORDER — HYDROCORTISONE SOD SUCCINATE 100 MG IJ SOLR
100.0000 mg | Freq: Three times a day (TID) | INTRAMUSCULAR | Status: DC
  Administered 2011-05-20 – 2011-05-22 (×7): 100 mg via INTRAVENOUS
  Filled 2011-05-20 (×11): qty 2

## 2011-05-20 MED ORDER — FUROSEMIDE 10 MG/ML IJ SOLN
60.0000 mg | Freq: Three times a day (TID) | INTRAMUSCULAR | Status: DC
  Filled 2011-05-20 (×2): qty 6

## 2011-05-20 MED ORDER — OXYCODONE HCL 5 MG PO TABS
5.0000 mg | ORAL_TABLET | Freq: Four times a day (QID) | ORAL | Status: DC | PRN
  Administered 2011-05-20 – 2011-05-24 (×11): 5 mg via ORAL
  Filled 2011-05-20 (×12): qty 1

## 2011-05-20 MED ORDER — FUROSEMIDE 10 MG/ML IJ SOLN
80.0000 mg | Freq: Three times a day (TID) | INTRAMUSCULAR | Status: DC
  Administered 2011-05-20 – 2011-05-22 (×7): 80 mg via INTRAVENOUS
  Filled 2011-05-20 (×8): qty 8

## 2011-05-20 MED ORDER — DIAZEPAM 5 MG PO TABS
5.0000 mg | ORAL_TABLET | Freq: Four times a day (QID) | ORAL | Status: DC | PRN
  Administered 2011-05-20: 5 mg via ORAL
  Filled 2011-05-20: qty 1

## 2011-05-20 MED ORDER — FUROSEMIDE 10 MG/ML IJ SOLN
40.0000 mg | Freq: Two times a day (BID) | INTRAMUSCULAR | Status: DC
  Filled 2011-05-20 (×2): qty 4

## 2011-05-20 NOTE — Progress Notes (Signed)
  Echocardiogram 2D Echocardiogram has been performed.  Mercy Moore 05/20/2011, 5:56 PM

## 2011-05-20 NOTE — Progress Notes (Signed)
Medicine Consult  PCP: Hoyle Sauer, MD, MD Person Requesting Consult: Dr. Danielle Dess Reason for Consult: Management of Diabetes  Brief HPI:  71 y/o CM with Diabetes (on insulin pump at home), COPD, CAD (s/p CABG) , HPL, HTN, CKD s/p renal transplant x 2 and lumbar degenerative disc disease. He presented to the hospital for elective surgery given his worsening radiculopathy. He underwent laminectomy and decompression of L5 S1.  Past medical history:  Past Medical History   Diagnosis  Date   .  CAD (coronary artery disease)      a. myoview 1/11: lg inf fixed defect; b. cath 1/11: 3v CAD, c. s/p CABG 1/11 c/b acute renal failure and AFib   .  Atrial fibrillation    .  Chronic diastolic heart failure      a. echo 4/11: EF 45-50%, mod LVH, LAE, inf and septal HK   .  First degree AV block    .  Orthostatic hypotension      pyridostigmine   .  History of renal transplant    .  DM2 (diabetes mellitus, type 2)    .  Diabetic gastroparesis    .  HTN (hypertension)    .  HLD (hyperlipidemia)    .  PAD (peripheral artery disease)      s/p R SFA-Pop atherectomy 11/11   .  Prostate cancer    .  ED (erectile dysfunction)    .  DDD (degenerative disc disease), lumbar    .  Tremor    .  Obesity    .  Low testosterone    .  Chronic kidney disease      s/p renal transplant 2002   .  Pleural effusion, right      loculated   .  COPD (chronic obstructive pulmonary disease)    .  Anxiety    .  Diabetes mellitus      insulin pump      Subjective: Patient more drowsy than yesterday. More confused. Denies any significant pain. Daughter at bedside.  Objective: Vital signs in last 24 hours: Temp:  [97.9 F (36.6 C)-100.7 F (38.2 C)] 98 F (36.7 C) (03/30 1401) Pulse Rate:  [88-93] 91  (03/30 1401) Resp:  [18-22] 20  (03/30 1401) BP: (111-132)/(39-66) 128/47 mmHg (03/30 1401) SpO2:  [90 %-97 %] 96 % (03/30 1401) FiO2 (%):  [4 %-40 %] 40 % (03/30 1229) Weight:  [103.2 kg (227 lb 8.2  oz)] 103.2 kg (227 lb 8.2 oz) (03/30 0451) Weight change: -6.4 kg (-14 lb 1.8 oz) Last BM Date: 05/15/11  Intake/Output from previous day: 03/29 0701 - 03/30 0700 In: 450 [P.O.:450] Out: 1000 [Urine:1000] Intake/Output this shift:    General appearance: Somnolent, distracted, no distress and slowed mentation Resp: crackles at bases. no wheezing. Cardio: regular rate and rhythm, S1, S2 normal, no murmur, click, rub or gallop GI: soft, non-tender; bowel sounds normal; no masses,  no organomegaly Extremities: extremities normal, atraumatic, no cyanosis or edema  Lab Results:  Basename 05/20/11 0630 05/18/11 0610  WBC 10.2 12.1*  HGB 8.8* 9.3*  HCT 28.1* 30.3*  PLT 151 143*   BMET  Basename 05/20/11 0836 05/20/11 0630 05/19/11 1050  NA -- 136 135  K -- 4.8 4.5  CL -- 99 97  CO2 -- 31 29  GLUCOSE -- 183* 113*  BUN -- 37* 29*  CREATININE -- 1.36* 1.33  CALCIUM -- 9.6 9.6  ALT 6 -- --    Studies/Results: Dg  Chest 2 View  05/18/2011  *RADIOLOGY REPORT*  Clinical Data: Pneumonia.  Edema.  CHEST - 2 VIEW  Comparison: 05/10/2011.  Findings: Interval enlargement of the cardiac silhouette with interval airspace opacity throughout the majority of both lungs. Decreased right lateral pleural fluid.  Stable post CABG changes.  IMPRESSION:  1.  Interval cardiomegaly and changes of acute congestive heart failure with diffuse alveolar edema. 2.  Decreased right pleural fluid.  Original Report Authenticated By: Darrol Angel, M.D.   Dg Chest Port 1 View  05/20/2011  *RADIOLOGY REPORT*  Clinical Data: Pulmonary edema.  PORTABLE CHEST - 1 VIEW  Comparison: 05/19/2011.  Findings: Worsening pulmonary aeration is present with increasing basilar predominant and perihilar airspace disease.  Cardiomegaly. Median sternotomy.  IMPRESSION: Constellation of findings compatible with worsening CHF, now moderate to severe.  Original Report Authenticated By: Andreas Newport, M.D.   Dg Chest Port 1  View  05/19/2011  *RADIOLOGY REPORT*  Clinical Data: Hypoxia.  Pulmonary edema.  Coronary artery disease. Postop from back surgery.  PORTABLE CHEST - 1 VIEW  Comparison: 05/18/2011  Findings: Cardiomegaly stable.  Improved aeration of both lungs is seen.  Pulmonary interstitial edema pattern is not simply changed allowing for increased lung volumes.  Probable small bilateral pleural effusions are noted.  IMPRESSION:  1.  Improved aeration of both lungs. 2.  No significant change in diffuse interstitial edema and cardiomegaly.  Probable small bilateral pleural effusions.  Original Report Authenticated By: Danae Orleans, M.D.    Medications:  Scheduled:    . aspirin EC  81 mg Oral Daily  . calcium carbonate  2 tablet Oral Daily  . carbidopa-levodopa  1 tablet Oral TID  . docusate sodium  100 mg Oral BID  . Fluticasone-Salmeterol  1 puff Inhalation Q12H  . furosemide  80 mg Intravenous TID  . hydrocortisone sod succinate (SOLU-CORTEF) injection  100 mg Intravenous Q8H  . insulin pump   Subcutaneous TID AC, HS, 0200  . lamoTRIgine  200 mg Oral Daily  . levalbuterol  0.63 mg Nebulization Q6H  . linagliptin  5 mg Oral Daily  . mycophenolate  500 mg Oral BID  . pantoprazole  40 mg Oral BID  . pyridostigmine  60 mg Oral TID  . senna  1 tablet Oral BID  . sodium chloride  3 mL Intravenous Q12H  . tacrolimus  5 mg Oral BID  . Tamsulosin HCl  0.4 mg Oral Daily  . tiotropium  18 mcg Inhalation Daily  . DISCONTD: atorvastatin  40 mg Oral q1800  . DISCONTD: ezetimibe  10 mg Oral Daily  . DISCONTD: furosemide  40 mg Intravenous Q12H  . DISCONTD: furosemide  60 mg Intravenous BID  . DISCONTD: furosemide  60 mg Intravenous TID  . DISCONTD: isosorbide mononitrate  60 mg Oral Daily  . DISCONTD: magnesium oxide  800 mg Oral BID  . DISCONTD: predniSONE  5 mg Oral Daily  . DISCONTD: risperiDONE  2 mg Oral QHS  . DISCONTD: Vilazodone HCl  20 mg Oral Daily    Assessment/Plan:  Principal Problem:   *Herniated nucleus pulposus Active Problems:  HYPERLIPIDEMIA-MIXED  Coronary atherosclerosis of native coronary artery  CORONARY ATHEROSLERO UNSPEC TYPE BYPASS GRAFT  Atrial fibrillation  Chronic diastolic heart failure  PVD WITH CLAUDICATION  Chronic kidney disease  COPD (chronic obstructive pulmonary disease)  First degree AV block  Spondylosis  Diastolic CHF, acute on chronic  DM (diabetes mellitus), type 2  Insulin pump in place  Altered mental  status    DM  Pt on an insulin pump managed by Dr. Leslie Dales in outpt setting - CBG well controlled at this time - A1c is 6.7. If patient continues to deteriorate will need to discontinue the pump and place on infusion or long acting basal insulin.    Acute diastolic CHF with Hypoxic Resp failure Appears to have become acutely decompensated as of 3/27. CKD probably contributing as well. Cards appears to be managing. CXR shows worsening in edema.   Fever Likely due to atelectasis. Continue to monitor.  HTN  Well controlled at present  CKD s/p renal transplant 2002  Recommend monitoring renal function closely. Involve Nephrology if creatinine rises.  Gold Stage IV COPD  on Advair /Spiriva/Oxygen chronically - followed by Dr. Delford Field in outpt setting. Pulmonology is following.  Chronic loculated right>left pleural effusion  right thoracentesis (6/12), cytology negative for malignancy   Hx of Afib  Rate controlled. Is not on chronic anticoagulation.  PAD  atherectomy right SFA and right popliteal on 01/12/10 - complicated by left groin pseudoaneurysm   Hx of CAD s/p CABG x5 Jan 2011  Stable  It appears patient is getting worse. Per Dr. Lynelle Doctor note patient may have to be moved back to SDU or ICU. We will continue to follow along with you. Thanks.    LOS: 4 days   [facility] Pager [phone] 05/20/2011, 3:22 PM

## 2011-05-20 NOTE — Progress Notes (Signed)
John [name]  71 y.o.  male  Subjective: Lethargic and mumbling, but responsive and appropriate; reports pain, but unable to provide additional details; no dyspnea,  Allergy: Review of patient's allergies indicates no known allergies.  Objective: Vital signs in last 24 hours: Temp:  [97.9 F (36.6 C)-100.7 F (38.2 C)] 99.2 F (37.3 C) (03/30 0451) Pulse Rate:  [85-99] 90  (03/30 0451) Resp:  [18-22] 22  (03/30 0451) BP: (100-132)/(50-66) 131/57 mmHg (03/30 0451) SpO2:  [90 %-97 %] 97 % (03/30 0451) FiO2 (%):  [40 %] 40 % (03/29 2300) Weight:  [103.2 kg (227 lb 8.2 oz)] 103.2 kg (227 lb 8.2 oz) (03/30 0451)  103.2 kg (227 lb 8.2 oz) Body mass index is 29.21 kg/(m^2).  Weight change: -6.4 kg (-14 lb 1.8 oz) Last BM Date: 05/15/11  Intake/Output from previous day: 03/29 0701 - 03/30 0700 In: 450 [P.O.:450] Out: 1000 [Urine:1000] Total I&O-+700cc  Weight gain of 25 lbs over the past few months, 15 lbs over 1 month; down 13 lbs over 2 days.  General- Well developed; tachypnea  Neck- mild to mod JVD, no carotid bruits Lungs- basilar rales; normal I:E ratio Cardiovascular- normal PMI; normal S1 and S2; 2/6 basilar systolic ejection murmur Abdomen- normal bowel sounds; soft and non-tender without masses or organomegaly Skin- Warm, no significant lesions Extremities- Nl distal pulses; no edema  Lab Results: CBC:   Basename 05/20/11 0630 05/18/11 0610  WBC 10.2 12.1*  HGB 8.8* 9.3*  HCT 28.1* 30.3*  PLT 151 143*   BMET:  Basename 05/20/11 0630 05/19/11 1050  NA 136 135  K 4.8 4.5  CL 99 97  CO2 31 29  GLUCOSE 183* 113*  BUN 37* 29*  CREATININE 1.36* 1.33  CALCIUM 9.6 9.6   EKG:   Orders placed in visit on 05/10/11  . EKG 12-LEAD  Sinus rhythm with profound 1st degree A-V block; Left anterior fascicular block; ST-T abnormality-lateral ischemia vs. LVH; IVCD  Imaging Studies/Results: Dg Chest 2 View  05/18/2011  *RADIOLOGY REPORT*  Clinical Data: Pneumonia.   Edema.  CHEST - 2 VIEW  Comparison: 05/10/2011.  Findings: Interval enlargement of the cardiac silhouette with interval airspace opacity throughout the majority of both lungs. Decreased right lateral pleural fluid.  Stable post CABG changes.  IMPRESSION:  1.  Interval cardiomegaly and changes of acute congestive heart failure with diffuse alveolar edema. 2.  Decreased right pleural fluid.  Original Report Authenticated By: Darrol Angel, M.D.   Dg Chest Port 1 View  05/19/2011  *RADIOLOGY REPORT*  Clinical Data: Hypoxia.  Pulmonary edema.  Coronary artery disease. Postop from back surgery.  PORTABLE CHEST - 1 VIEW  Comparison: 05/18/2011  Findings: Cardiomegaly stable.  Improved aeration of both lungs is seen.  Pulmonary interstitial edema pattern is not simply changed allowing for increased lung volumes.  Probable small bilateral pleural effusions are noted.  IMPRESSION:  1.  Improved aeration of both lungs. 2.  No significant change in diffuse interstitial edema and cardiomegaly.  Probable small bilateral pleural effusions.  Original Report Authenticated By: Danae Orleans, M.D.    Imaging: Imaging results have been reviewed  Medications:  I have reviewed the patient's current medications. Scheduled:   . aspirin EC  81 mg Oral Daily  . atorvastatin  40 mg Oral q1800  . calcium carbonate  2 tablet Oral Daily  . carbidopa-levodopa  1 tablet Oral TID  . docusate sodium  100 mg Oral BID  . ezetimibe  10  mg Oral Daily  . Fluticasone-Salmeterol  1 puff Inhalation Q12H  . furosemide  60 mg Intravenous BID  . insulin pump   Subcutaneous TID AC, HS, 0200  . isosorbide mononitrate  60 mg Oral Daily  . lamoTRIgine  200 mg Oral Daily  . levalbuterol  0.63 mg Nebulization Q6H  . linagliptin  5 mg Oral Daily  . magnesium oxide  800 mg Oral BID  . mycophenolate  500 mg Oral BID  . pantoprazole  40 mg Oral BID  . predniSONE  5 mg Oral Daily  . pyridostigmine  60 mg Oral TID  . risperiDONE  2 mg Oral  QHS  . senna  1 tablet Oral BID  . sodium chloride  3 mL Intravenous Q12H  . tacrolimus  5 mg Oral BID  . Tamsulosin HCl  0.4 mg Oral Daily  . tiotropium  18 mcg Inhalation Daily  . Vilazodone HCl  20 mg Oral Daily  . DISCONTD: furosemide  40 mg Intravenous BID    Assessment/Plan: Impaired mental status-may simply be due to narcotics, but other causes should be excluded.  All sedating meds other than analgesics discontinued.  Extraneous cardiac meds held.  No H/O EtOH abuse.  Chronic steroid Rx->stress dose of IV steroid unless inordinately exacerbates diabetes.  Check TSH and ABG.  CHF: modest diuresis with stable renal function->increase furosemide.  Daily electrolytes and renal function.  Anemia-slightly worse since surgery.  AF-recheck EKG.  Note marked first degree AVB.  He cannot receive drugs with AV nodal blocking properties.   LOS: 4 days   John [name] 05/20/2011, 7:55 AM

## 2011-05-20 NOTE — Progress Notes (Signed)
Name: John [name] MRN: [id number] DOB: [birth year]    LOS: 4  PCCM PROGESS  NOTE  History of Present Illness: 71 y.o. WM s/p lum lam single level.  Complex Med Hx includes Gold D COPD oxygen dependent.  PWright follows in office  Subj: Reports less dyspnea, sleeping more with pain meds.  Groggy. Cr rising  Lines / Drains: none  Cultures: BC x 2 3/28 UC 3/29 Sputum c/s 3/29>>not usable  Antibiotics: none  Tests / Events: none    Past Medical History  Diagnosis Date  . CAD (coronary artery disease)     a. myoview 1/11: lg inf fixed defect;   b. cath 1/11: 3v CAD,  c. s/p CABG 1/11 c/b acute renal failure and AFib  . Atrial fibrillation   . Chronic diastolic heart failure     a. echo 4/11: EF 45-50%, mod LVH, LAE, inf and septal HK  . First degree AV block   . Orthostatic hypotension     pyridostigmine  . History of renal transplant   . DM2 (diabetes mellitus, type 2)   . Diabetic gastroparesis   . HTN (hypertension)   . HLD (hyperlipidemia)   . PAD (peripheral artery disease)     s/p R SFA-Pop atherectomy 11/11  . Prostate cancer   . ED (erectile dysfunction)   . DDD (degenerative disc disease), lumbar   . Tremor   . Obesity   . Low testosterone   . Chronic kidney disease     s/p renal transplant 2002  . Pleural effusion, right     loculated  . COPD (chronic obstructive pulmonary disease)   . Anxiety   . Diabetes mellitus     insulin pump   Past Surgical History  Procedure Date  . Coronary artery bypass graft 03/2009  . Cataract extraction   . Vitrectomy   . Tonsillectomy   . Kidney transplant 2002  . Hernia repair   . Prostate cancer rx 2006 brachytherapy   . Prior peritoneal catheter placement   . Lumbar laminectomy/decompression microdiscectomy 05/16/2011    Procedure: LUMBAR LAMINECTOMY/DECOMPRESSION MICRODISCECTOMY 1 LEVEL;  Surgeon: Barnett Abu, MD;  Location: [facility];  Service: Neurosurgery;  Laterality: Bilateral;  Bilateral Lumbar  Five-Sacral One Laminectomy   Prior to Admission medications   Medication Sig Start Date End Date Taking? Authorizing Provider  albuterol (PROVENTIL HFA;VENTOLIN HFA) 108 (90 BASE) MCG/ACT inhaler Inhale 2 puffs into the lungs every 6 (six) hours as needed for wheezing. 08/26/10 08/26/11 Yes Nyoka Cowden, MD  aspirin EC 81 MG tablet Take 1 tablet (81 mg total) by mouth daily. 07/12/10 07/12/11 Yes Laurey Morale, MD  atorvastatin (LIPITOR) 40 MG tablet Take 40 mg by mouth daily.     Yes Historical Provider, MD  calcium carbonate (OS-CAL - DOSED IN MG OF ELEMENTAL CALCIUM) 1250 MG tablet Take 2 tablets by mouth daily.   Yes Historical Provider, MD  carbidopa-levodopa (SINEMET CR) 25-100 MG per tablet Take 1 tablet by mouth 3 (three) times daily.     Yes Historical Provider, MD  clopidogrel (PLAVIX) 75 MG tablet Take 75 mg by mouth daily.   Yes Historical Provider, MD  ezetimibe (ZETIA) 10 MG tablet Take 10 mg by mouth daily.     Yes Historical Provider, MD  Ferrous Sulfate (IRON) 325 (65 FE) MG TABS Take 1 tablet by mouth daily.    Yes Historical Provider, MD  Fluticasone-Salmeterol (ADVAIR DISKUS) 250-50 MCG/DOSE AEPB Inhale 1 puff into the lungs every  12 (twelve) hours.     Yes Historical Provider, MD  folic acid (FOLVITE) 800 MCG tablet Take 800 mcg by mouth daily.   Yes Historical Provider, MD  furosemide (LASIX) 40 MG tablet Take 2 tablets in the am and 1 tablet in the pm daily by mouth 02/27/11  Yes Laurey Morale, MD  Insulin Aspart (NOVOLOG [place]) Inject into the skin continuous. Via pump as directed    Yes Historical Provider, MD  isosorbide mononitrate (IMDUR) 60 MG 24 hr tablet Take 1 tablet (60 mg total) by mouth daily. 01/31/11  Yes Laurey Morale, MD  LamoTRIgine (LAMICTAL ODT PO) Take 200 mg by mouth daily.    Yes Historical Provider, MD  magnesium oxide (MAG-OX) 400 MG tablet Take 800 mg by mouth 2 (two) times daily.    Yes Historical Provider, MD  Multiple Vitamin (MULITIVITAMIN WITH  MINERALS) TABS Take 1 tablet by mouth daily.   Yes Historical Provider, MD  mycophenolate (CELLCEPT) 500 MG tablet Take 500 mg by mouth 2 (two) times daily.    Yes Historical Provider, MD  oxyCODONE (OXY IR/ROXICODONE) 5 MG immediate release tablet Take 1 tablet by mouth 2 (two) times daily as needed. For pain 12/14/10  Yes Historical Provider, MD  pantoprazole (PROTONIX) 40 MG tablet Take 40 mg by mouth 2 (two) times daily.    Yes Historical Provider, MD  predniSONE (DELTASONE) 5 MG tablet Take 5 mg by mouth daily.     Yes Historical Provider, MD  pyridostigmine (MESTINON) 60 MG tablet Take 60 mg by mouth 3 (three) times daily.   Yes Historical Provider, MD  risperiDONE (RISPERDAL) 2 MG tablet Take 1 tablet by mouth Daily.   Yes Historical Provider, MD  saxagliptin HCl (ONGLYZA) 5 MG TABS tablet Take 5 mg by mouth daily.     Yes Historical Provider, MD  tacrolimus (PROGRAF) 5 MG capsule Take 5 mg by mouth 2 (two) times daily.     Yes Historical Provider, MD  Tamsulosin HCl (FLOMAX) 0.4 MG CAPS Take 0.4 mg by mouth daily.    Yes Historical Provider, MD  temazepam (RESTORIL) 15 MG capsule Take 15 mg by mouth at bedtime as needed. For sleep   Yes Historical Provider, MD  tiotropium (SPIRIVA HANDIHALER) 18 MCG inhalation capsule Place 1 capsule (18 mcg total) into inhaler and inhale daily. 02/17/11 02/17/12 Yes Lupita Leash, MD  Vilazodone HCl (VIIBRYD) 40 MG TABS Take 20 mg by mouth daily.    Yes Historical Provider, MD  vitamin B-12 (CYANOCOBALAMIN) 500 MCG tablet Take 500 mcg by mouth daily.   Yes Historical Provider, MD  VITAMIN D, CHOLECALCIFEROL, PO Take 10,000 Units by mouth 3 (three) times a week. On Monday, Wednesday, and Friday   Yes Historical Provider, MD  clobetasol (OLUX) 0.05 % topical foam Apply 1 application topically daily as needed. For head 12/12/10   Historical Provider, MD   Allergies No Known Allergies  Family History Family History  Problem Relation Age of Onset  .  Coronary artery disease Mother   . Valvular heart disease Father   . Hepatitis      sibling  . Coronary artery disease Sister   . Heart attack Sister   . Tuberculosis Father     Social History  reports that he quit smoking about 33 years ago. His smoking use included Cigarettes. He has a 40 pack-year smoking history. He has never used smokeless tobacco. He reports that he does not drink alcohol or use illicit  drugs.  Review Of Systems  11 points review of systems is negative with an exception of listed in HPI.  Vital Signs: Temp:  [97.9 F (36.6 C)-100.7 F (38.2 C)] 99.2 F (37.3 C) (03/30 0451) Pulse Rate:  [85-93] 90  (03/30 0451) Resp:  [18-22] 22  (03/30 0451) BP: (100-132)/(50-66) 131/57 mmHg (03/30 0451) SpO2:  [90 %-97 %] 96 % (03/30 1011) FiO2 (%):  [4 %-40 %] 4 % (03/30 1011) Weight:  [103.2 kg (227 lb 8.2 oz)] 103.2 kg (227 lb 8.2 oz) (03/30 0451) I/O last 3 completed shifts: In: 570 [P.O.:570] Out: 1900 [Urine:1900]  Physical Examination: General:  C/o pain.  In no distress Neuro:  Moves all 4s, groggy but will answer ?s HEENT:  No lesions, dry mucus membranes Neck:  Mild JVD   Cardiovascular:  RRR nl s1 s2 no s3 Lungs:  Distant BS, no wheeze, scattered rhonchi Abdomen:  Distended, BSA Musculoskeletal:  FROM Skin:  Clear except for scattered bruises     FiO2 (%):  [40 %] 40 % VM  Labs and Imaging:  Reviewed.  Please refer to the Assessment and Plan section for relevant results. Dg Chest 2 View  05/18/2011  *RADIOLOGY REPORT*  Clinical Data: Pneumonia.  Edema.  CHEST - 2 VIEW  Comparison: 05/10/2011.  Findings: Interval enlargement of the cardiac silhouette with interval airspace opacity throughout the majority of both lungs. Decreased right lateral pleural fluid.  Stable post CABG changes.  IMPRESSION:  1.  Interval cardiomegaly and changes of acute congestive heart failure with diffuse alveolar edema. 2.  Decreased right pleural fluid.  Original Report  Authenticated By: Darrol Angel, M.D.   Dg Chest Port 1 View  05/20/2011  *RADIOLOGY REPORT*  Clinical Data: Pulmonary edema.  PORTABLE CHEST - 1 VIEW  Comparison: 05/19/2011.  Findings: Worsening pulmonary aeration is present with increasing basilar predominant and perihilar airspace disease.  Cardiomegaly. Median sternotomy.  IMPRESSION: Constellation of findings compatible with worsening CHF, now moderate to severe.  Original Report Authenticated By: Andreas Newport, M.D.   Dg Chest Port 1 View  05/19/2011  *RADIOLOGY REPORT*  Clinical Data: Hypoxia.  Pulmonary edema.  Coronary artery disease. Postop from back surgery.  PORTABLE CHEST - 1 VIEW  Comparison: 05/18/2011  Findings: Cardiomegaly stable.  Improved aeration of both lungs is seen.  Pulmonary interstitial edema pattern is not simply changed allowing for increased lung volumes.  Probable small bilateral pleural effusions are noted.  IMPRESSION:  1.  Improved aeration of both lungs. 2.  No significant change in diffuse interstitial edema and cardiomegaly.  Probable small bilateral pleural effusions.  Original Report Authenticated By: Danae Orleans, M.D.   Assessment and Plan: Patient Active Hospital Problem List: Herniated nucleus pulposus (05/16/2011)   Assessment: s/p surgical intervention   Plan: per neurosurg Pain control an issue that is causing AMS  CORONARY ATHEROSLERO UNSPEC TYPE BYPASS GRAFT (11/16/2009)   Assessment: no post op ischemia   Plan: per cardiology/ [facility]  Atrial fibrillation (05/26/2009)   Assessment: rate controlled   Plan: cont home meds  Acute on Chronic diastolic heart failure (05/26/2009)   Assessment: volume excess>>>Neg I/ O past two days    Plan: agree with lasix per cards, watch Cr >>>call renal Reduce lasix dose  PVD WITH CLAUDICATION (11/01/2009)   Assessment: stable   Plan: no changes  COPD (chronic obstructive pulmonary disease) (08/18/2010)   Assessment: Golds D oxygen dependent   Plan: cont O2,  pulm toilet, advair, spiriva, add Flutter valve,  cont Xopenex nebs  Obstipation postop Assessment: needs bowel program d/t narcs Plan: bowel program  Delirium: On risperdal Pain management challenging  D/c risperdal and Viibryd for now  Acute respiratory failure Assessment: d/t pulm edema and ATX Plan May need to go to SDU or ICU today  Renal transplant  Lab 05/20/11 0630 05/19/11 1050 05/19/11 0640 05/18/11 0610  CREATININE 1.36* 1.33 1.43* 1.07   Tacrolimus level ok  8.0    Assessment:  Cr up.   Plan  Monitor bmp daily,  Obtain  renal consultation  Shan Levans  M.D. Pulmonary and Critical Care Medicine [facility] Cell: [phone]   Pager: [phone] 05/20/2011, 10:20 AM

## 2011-05-20 NOTE — Progress Notes (Signed)
Patient ID: John [name], male   DOB: [birth year], 71 y.o.   MRN: [id number] Subjective:  The patient is pleasant. His back is still sore. He has not mobilize much. He continues to have a Foley catheter.  Objective: Vital signs in last 24 hours: Temp:  [97.9 F (36.6 C)-100.7 F (38.2 C)] 99.1 F (37.3 C) (03/30 1023) Pulse Rate:  [85-93] 89  (03/30 1023) Resp:  [18-22] 20  (03/30 1023) BP: (100-132)/(39-66) 111/39 mmHg (03/30 1023) SpO2:  [90 %-97 %] 96 % (03/30 1023) FiO2 (%):  [4 %-40 %] 4 % (03/30 1011) Weight:  [103.2 kg (227 lb 8.2 oz)] 103.2 kg (227 lb 8.2 oz) (03/30 0451)  Intake/Output from previous day: 03/29 0701 - 03/30 0700 In: 450 [P.O.:450] Out: 1000 [Urine:1000] Intake/Output this shift:    Physical exam the patient is alert and oriented. He is moving his lower extremities well. His wound is healing well without signs of infection.  Lab Results:  Basename 05/20/11 0630 05/18/11 0610  WBC 10.2 12.1*  HGB 8.8* 9.3*  HCT 28.1* 30.3*  PLT 151 143*   BMET  Basename 05/20/11 0630 05/19/11 1050  NA 136 135  K 4.8 4.5  CL 99 97  CO2 31 29  GLUCOSE 183* 113*  BUN 37* 29*  CREATININE 1.36* 1.33  CALCIUM 9.6 9.6    Studies/Results: Dg Chest 2 View  05/18/2011  *RADIOLOGY REPORT*  Clinical Data: Pneumonia.  Edema.  CHEST - 2 VIEW  Comparison: 05/10/2011.  Findings: Interval enlargement of the cardiac silhouette with interval airspace opacity throughout the majority of both lungs. Decreased right lateral pleural fluid.  Stable post CABG changes.  IMPRESSION:  1.  Interval cardiomegaly and changes of acute congestive heart failure with diffuse alveolar edema. 2.  Decreased right pleural fluid.  Original Report Authenticated By: Darrol Angel, M.D.   Dg Chest Port 1 View  05/20/2011  *RADIOLOGY REPORT*  Clinical Data: Pulmonary edema.  PORTABLE CHEST - 1 VIEW  Comparison: 05/19/2011.  Findings: Worsening pulmonary aeration is present with increasing basilar  predominant and perihilar airspace disease.  Cardiomegaly. Median sternotomy.  IMPRESSION: Constellation of findings compatible with worsening CHF, now moderate to severe.  Original Report Authenticated By: Andreas Newport, M.D.   Dg Chest Port 1 View  05/19/2011  *RADIOLOGY REPORT*  Clinical Data: Hypoxia.  Pulmonary edema.  Coronary artery disease. Postop from back surgery.  PORTABLE CHEST - 1 VIEW  Comparison: 05/18/2011  Findings: Cardiomegaly stable.  Improved aeration of both lungs is seen.  Pulmonary interstitial edema pattern is not simply changed allowing for increased lung volumes.  Probable small bilateral pleural effusions are noted.  IMPRESSION:  1.  Improved aeration of both lungs. 2.  No significant change in diffuse interstitial edema and cardiomegaly.  Probable small bilateral pleural effusions.  Original Report Authenticated By: Danae Orleans, M.D.    Assessment/Plan: Postop day #4: The patient looks like his, need rehabilitation or skilled nursing facility placement. We'll continue to attempt to mobilize him here in the hospital.  COPD, etc.: I appreciate Dr. Lynelle Doctor help.  LOS: 4 days     John [name] D 05/20/2011, 11:39 AM

## 2011-05-20 NOTE — Consult Note (Signed)
[facility] - CONSULT NOTE Resident Note    Please see below for attending addendum to resident note.   Date: 05/20/2011                  Patient Name:  John [name]  MRN: [id number]  DOB: December 18, 1940  Age / Sex: 71 y.o., male         PCP: Hoyle Sauer, MD, MD                 Referring Physician: Neurosurgery                  Reason for Consult:  Acute on chronic renal failure S/p renal transplant             History of Present Illness: Patient is a 71 y.o. male with a PMHx of Kidney disease status post renal transplant (2002) with Creatinine baseline of 1.3, hypertension, diabetes on insulin pump, coronary artery disease status post CABG, COPD, hyperlipidemia, chronic back pain with lumbar degenerative disc disease  (spondylosis and herniated nucleus pulposus L5-S1 with bilateral radical opacity )who was admitted to [facility] on 05/16/2011  for Bilateral laminotomies and  decompression of L5 and S1 nerve roots by Dr. Danielle Dess.     Patient developed episodes of confusion and worsening respiratory status on March 28 . Patient with you had a temperature of 100.1. A chest x-ray was obtained which showed cardiomegaly and changes of acute congestive heart failure with diffuse alveolar edema. Lasix was initiated. Urine output since March 26 was between 700 cc and 1000 cc. Creatinine increased maximum of 1.4 on March 29. Blood pressure as low as 100/50 on March 29 Blood culture as well as urine culture are negative to date.    Medications: Outpatient medications: Prescriptions prior to admission  Medication Sig Dispense Refill  . albuterol (PROVENTIL HFA;VENTOLIN HFA) 108 (90 BASE) MCG/ACT inhaler Inhale 2 puffs into the lungs every 6 (six) hours as needed for wheezing.  1 Inhaler  0  . aspirin EC 81 MG tablet Take 1 tablet (81 mg total) by mouth daily.  150 tablet  2  . atorvastatin (LIPITOR) 40 MG tablet Take 40 mg by mouth daily.        . calcium carbonate (OS-CAL -  DOSED IN MG OF ELEMENTAL CALCIUM) 1250 MG tablet Take 2 tablets by mouth daily.      . carbidopa-levodopa (SINEMET CR) 25-100 MG per tablet Take 1 tablet by mouth 3 (three) times daily.        . clopidogrel (PLAVIX) 75 MG tablet Take 75 mg by mouth daily.      Marland Kitchen ezetimibe (ZETIA) 10 MG tablet Take 10 mg by mouth daily.        . Ferrous Sulfate (IRON) 325 (65 FE) MG TABS Take 1 tablet by mouth daily.       . Fluticasone-Salmeterol (ADVAIR DISKUS) 250-50 MCG/DOSE AEPB Inhale 1 puff into the lungs every 12 (twelve) hours.        . folic acid (FOLVITE) 800 MCG tablet Take 800 mcg by mouth daily.      . furosemide (LASIX) 40 MG tablet Take 2 tablets in the am and 1 tablet in the pm daily by mouth  90 tablet  2  . Insulin Aspart (NOVOLOG [place]) Inject into the skin continuous. Via pump as directed       . isosorbide mononitrate (IMDUR) 60 MG 24 hr tablet Take 1 tablet (60 mg total) by mouth  daily.  30 tablet  11  . LamoTRIgine (LAMICTAL ODT PO) Take 200 mg by mouth daily.       . magnesium oxide (MAG-OX) 400 MG tablet Take 800 mg by mouth 2 (two) times daily.       . Multiple Vitamin (MULITIVITAMIN WITH MINERALS) TABS Take 1 tablet by mouth daily.      . mycophenolate (CELLCEPT) 500 MG tablet Take 500 mg by mouth 2 (two) times daily.       Marland Kitchen oxyCODONE (OXY IR/ROXICODONE) 5 MG immediate release tablet Take 1 tablet by mouth 2 (two) times daily as needed. For pain      . pantoprazole (PROTONIX) 40 MG tablet Take 40 mg by mouth 2 (two) times daily.       . predniSONE (DELTASONE) 5 MG tablet Take 5 mg by mouth daily.        Marland Kitchen pyridostigmine (MESTINON) 60 MG tablet Take 60 mg by mouth 3 (three) times daily.      . risperiDONE (RISPERDAL) 2 MG tablet Take 1 tablet by mouth Daily.      . saxagliptin HCl (ONGLYZA) 5 MG TABS tablet Take 5 mg by mouth daily.        . tacrolimus (PROGRAF) 5 MG capsule Take 5 mg by mouth 2 (two) times daily.        . Tamsulosin HCl (FLOMAX) 0.4 MG CAPS Take 0.4 mg by mouth daily.        . temazepam (RESTORIL) 15 MG capsule Take 15 mg by mouth at bedtime as needed. For sleep      . tiotropium (SPIRIVA HANDIHALER) 18 MCG inhalation capsule Place 1 capsule (18 mcg total) into inhaler and inhale daily.  30 capsule  6  . Vilazodone HCl (VIIBRYD) 40 MG TABS Take 20 mg by mouth daily.       . vitamin B-12 (CYANOCOBALAMIN) 500 MCG tablet Take 500 mcg by mouth daily.      Marland Kitchen VITAMIN D, CHOLECALCIFEROL, PO Take 10,000 Units by mouth 3 (three) times a week. On Monday, Wednesday, and Friday      . clobetasol (OLUX) 0.05 % topical foam Apply 1 application topically daily as needed. For head        Current medications: Current Facility-Administered Medications  Medication Dose Route Frequency Provider Last Rate Last Dose  . 0.9 %  sodium chloride infusion  250 mL Intravenous Continuous Barnett Abu, MD      . acetaminophen (TYLENOL) tablet 650 mg  650 mg Oral Q4H PRN Barnett Abu, MD       Or  . acetaminophen (TYLENOL) suppository 650 mg  650 mg Rectal Q4H PRN Barnett Abu, MD      . aspirin EC tablet 81 mg  81 mg Oral Daily Barnett Abu, MD   81 mg at 05/20/11 1050  . calcium carbonate (OS-CAL - dosed in mg of elemental calcium) tablet 1,000 mg of elemental calcium  2 tablet Oral Daily Barnett Abu, MD   1,000 mg of elemental calcium at 05/18/11 1028  . carbidopa-levodopa (SINEMET) 25-100 MG per tablet 1 tablet  1 tablet Oral TID Barnett Abu, MD   1 tablet at 05/20/11 1050  . docusate sodium (COLACE) capsule 100 mg  100 mg Oral BID Barnett Abu, MD   100 mg at 05/20/11 1058  . Fluticasone-Salmeterol (ADVAIR) 250-50 MCG/DOSE inhaler 1 puff  1 puff Inhalation Q12H Barnett Abu, MD   1 puff at 05/20/11 1003  . furosemide (LASIX) injection 40 mg  40 mg Intravenous Q12H Storm Frisk, MD      . hydrocortisone sodium succinate (SOLU-CORTEF) 100 mg/2 mL injection 100 mg  100 mg Intravenous Q8H Kathlen Brunswick, MD   100 mg at 05/20/11 1145  . insulin pump   Subcutaneous TID AC, HS, 0200  Penny Pia, MD      . lamoTRIgine (LAMICTAL) tablet 200 mg  200 mg Oral Daily Barnett Abu, MD   200 mg at 05/20/11 1052  . levalbuterol (XOPENEX) nebulizer solution 0.63 mg  0.63 mg Nebulization Q6H Storm Frisk, MD   0.63 mg at 05/20/11 1228  . linagliptin (TRADJENTA) tablet 5 mg  5 mg Oral Daily Barnett Abu, MD   5 mg at 05/20/11 1052  . magnesium hydroxide (MILK OF MAGNESIA) suspension 30 mL  30 mL Oral Daily PRN Barnett Abu, MD      . menthol-cetylpyridinium (CEPACOL) lozenge 3 mg  1 lozenge Oral PRN Barnett Abu, MD       Or  . phenol (CHLORASEPTIC) mouth spray 1 spray  1 spray Mouth/Throat PRN Barnett Abu, MD      . mycophenolate (CELLCEPT) capsule 500 mg  500 mg Oral BID Barnett Abu, MD   500 mg at 05/20/11 1053  . ondansetron (ZOFRAN) injection 4 mg  4 mg Intravenous Q4H PRN Barnett Abu, MD      . oxyCODONE (Oxy IR/ROXICODONE) immediate release tablet 5 mg  5 mg Oral Q6H PRN Barnett Abu, MD   5 mg at 05/20/11 0702  . pantoprazole (PROTONIX) EC tablet 40 mg  40 mg Oral BID Barnett Abu, MD   40 mg at 05/20/11 1058  . pyridostigmine (MESTINON) tablet 60 mg  60 mg Oral TID Barnett Abu, MD   60 mg at 05/20/11 1054  . senna (SENOKOT) tablet 8.6 mg  1 tablet Oral BID Barnett Abu, MD   8.6 mg at 05/20/11 1054  . sodium chloride 0.9 % injection 3 mL  3 mL Intravenous Q12H Barnett Abu, MD   3 mL at 05/20/11 1007  . sodium chloride 0.9 % injection 3 mL  3 mL Intravenous PRN Barnett Abu, MD      . tacrolimus (PROGRAF) capsule 5 mg  5 mg Oral BID Barnett Abu, MD   5 mg at 05/20/11 1054  . Tamsulosin HCl (FLOMAX) capsule 0.4 mg  0.4 mg Oral Daily Barnett Abu, MD   0.4 mg at 05/20/11 1055  . tiotropium (SPIRIVA) inhalation capsule 18 mcg  18 mcg Inhalation Daily Barnett Abu, MD   18 mcg at 05/20/11 1004  . DISCONTD: atorvastatin (LIPITOR) tablet 40 mg  40 mg Oral q1800 Barnett Abu, MD   40 mg at 05/19/11 1756  . DISCONTD: diazepam (VALIUM) tablet 5 mg  5 mg Oral Q6H PRN Barnett Abu, MD   5 mg at 05/20/11 0702  . DISCONTD: ezetimibe (ZETIA) tablet 10 mg  10 mg Oral Daily Barnett Abu, MD   10 mg at 05/19/11 0957  . DISCONTD: furosemide (LASIX) injection 60 mg  60 mg Intravenous BID Storm Frisk, MD   60 mg at 05/20/11 0743  . DISCONTD: furosemide (LASIX) injection 60 mg  60 mg Intravenous TID Kathlen Brunswick, MD      . DISCONTD: isosorbide mononitrate (IMDUR) 24 hr tablet 60 mg  60 mg Oral Daily Barnett Abu, MD   60 mg at 05/19/11 0958  . DISCONTD: magnesium oxide (MAG-OX) tablet 800 mg  800 mg Oral BID Barnett Abu, MD  800 mg at 05/19/11 2230  . DISCONTD: oxyCODONE (Oxy IR/ROXICODONE) immediate release tablet 5 mg  5 mg Oral BID PRN Barnett Abu, MD   5 mg at 05/19/11 2116  . DISCONTD: oxyCODONE-acetaminophen (PERCOCET) 5-325 MG per tablet 1-2 tablet  1-2 tablet Oral Q4H PRN Barnett Abu, MD   2 tablet at 05/19/11 0756  . DISCONTD: predniSONE (DELTASONE) tablet 5 mg  5 mg Oral Daily Barnett Abu, MD   5 mg at 05/19/11 1002  . DISCONTD: risperiDONE (RISPERDAL) tablet 2 mg  2 mg Oral QHS Barnett Abu, MD   2 mg at 05/19/11 2230  . DISCONTD: temazepam (RESTORIL) capsule 15 mg  15 mg Oral QHS PRN Barnett Abu, MD   15 mg at 05/17/11 2159  . DISCONTD: Vilazodone HCl TABS 20 mg  20 mg Oral Daily Barnett Abu, MD   20 mg at 05/19/11 1004      Allergies: No Known Allergies    Past Medical History: Past Medical History  Diagnosis Date  . CAD (coronary artery disease)     a. myoview 1/11: lg inf fixed defect;   b. cath 1/11: 3v CAD,  c. s/p CABG 1/11 c/b acute renal failure and AFib  . Atrial fibrillation, no on chronic anticoagulation    . Chronic diastolic heart failure     a. echo 4/11: EF 45-50%, mod LVH, LAE, inf and septal HK  . First degree AV block   . Orthostatic hypotension     pyridostigmine  . History of renal transplant   . DM2 (diabetes mellitus, type 2)   . Diabetic gastroparesis   . HTN (hypertension)   . HLD (hyperlipidemia)   . PAD  (peripheral artery disease)     s/p R SFA-Pop atherectomy 11/11  . Prostate cancer   . ED (erectile dysfunction)   . DDD (degenerative disc disease), lumbar   . Tremor   . Obesity   . Low testosterone   . Chronic kidney disease     s/p renal transplant 2002  . Pleural effusion, right     loculated  . COPD (chronic obstructive pulmonary disease) stage IV per Gold , Followed by Dr Delford Field   . Anxiety   . Diabetes mellitus     insulin pump     Past Surgical History: Past Surgical History  Procedure Date  . Coronary artery bypass graft 03/2009  . Cataract extraction   . Vitrectomy   . Tonsillectomy   . Kidney transplant 2002  . Hernia repair   . Prostate cancer rx 2006 brachytherapy   . Prior peritoneal catheter placement   . Lumbar laminectomy/decompression microdiscectomy 05/16/2011    Procedure: LUMBAR LAMINECTOMY/DECOMPRESSION MICRODISCECTOMY 1 LEVEL;  Surgeon: Barnett Abu, MD;  Location: [facility];  Service: Neurosurgery;  Laterality: Bilateral;  Bilateral Lumbar Five-Sacral One Laminectomy     Family History: Family History  Problem Relation Age of Onset  . Coronary artery disease Mother   . Valvular heart disease Father   . Hepatitis      sibling  . Coronary artery disease Sister   . Heart attack Sister   . Tuberculosis Father      Social History:  reports that he quit smoking about 33 years ago. His smoking use included Cigarettes. He has a 40 pack-year smoking history. He has never used smokeless tobacco. He reports that he does not drink alcohol or use illicit drugs.   Review of Systems: As per HPI  Vital Signs: Blood pressure 111/39, pulse 89,  temperature 99.1 F (37.3 C), temperature source Oral, resp. rate 20, height 6\' 2"  (1.88 m), weight 227 lb 8.2 oz (103.2 kg), SpO2 96.00%.  Weight trends: Filed Weights   05/16/11 1928 05/19/11 0600 05/20/11 0451  Weight: 232 lb 5.8 oz (105.4 kg) 241 lb 10 oz (109.6 kg) 227 lb 8.2 oz (103.2 kg)     Physical Exam: General: Vital signs reviewed and noted. Mild distress.Currently on venti mask ; alert,  and cooperative throughout examination.  Eyes: PERRL, EOMI,   Neck: JVD present   Lungs:  Decreased air movement. Rhonchi present   Heart: RRR. S1 and S2 normal with 2/6 basilar SEM.  Abdomen:  BS normoactive. Soft, Nondistended, non-tender.    Extremities: No pretibial edema.  Neurologic: A&O X3, CN II - XII are grossly intact.  Sensations intact to light touch, .    Lab results: Basic Metabolic Panel:  Lab 05/20/11 1610 05/19/11 1050 05/19/11 0640  NA 136 135 137  K 4.8 4.5 4.6  CL 99 97 100  CO2 31 29 29   GLUCOSE 183* 113* 92  BUN 37* 29* 29*  CREATININE 1.36* 1.33 1.43*  CALCIUM 9.6 9.6 9.7  MG -- -- --  PHOS -- -- --    Liver Function Tests:  Lab 05/20/11 0836  AST 16  ALT 6  ALKPHOS 121*  BILITOT 0.8  PROT 6.3  ALBUMIN 2.2*   CBC:  Lab 05/20/11 0630 05/18/11 0610  WBC 10.2 12.1*  NEUTROABS 8.0* --  HGB 8.8* 9.3*  HCT 28.1* 30.3*  MCV 71.0* 71.3*  PLT 151 143*     CBG:  Lab 05/20/11 1150 05/20/11 0655 05/19/11 2258 05/19/11 1706 05/19/11 1133  GLUCAP 169* 181* 182* 130* 120*    Microbiology: Results for orders placed during the hospital encounter of 05/16/11  CULTURE, BLOOD (ROUTINE X 2)     Status: Normal (Preliminary result)   Collection Time   05/18/11  6:45 PM      Component Value Range Status Comment   Specimen Description BLOOD LEFT ARM   Final    Special Requests BOTTLES DRAWN AEROBIC ONLY 10CC   Final    Culture  Setup Time [id number]   Final    Culture     Final    Value:        BLOOD CULTURE RECEIVED NO GROWTH TO DATE CULTURE WILL BE HELD FOR 5 DAYS BEFORE ISSUING A FINAL NEGATIVE REPORT   Report Status PENDING   Incomplete   CULTURE, BLOOD (ROUTINE X 2)     Status: Normal (Preliminary result)   Collection Time   05/18/11  6:50 PM      Component Value Range Status Comment   Specimen Description BLOOD RIGHT HAND   Final     Special Requests BOTTLES DRAWN AEROBIC ONLY 10CC   Final    Culture  Setup Time [id number]   Final    Culture     Final    Value:        BLOOD CULTURE RECEIVED NO GROWTH TO DATE CULTURE WILL BE HELD FOR 5 DAYS BEFORE ISSUING A FINAL NEGATIVE REPORT   Report Status PENDING   Incomplete   URINE CULTURE     Status: Normal (Preliminary result)   Collection Time   05/19/11 10:33 AM      Component Value Range Status Comment   Specimen Description URINE, CATHETERIZED   Final    Special Requests CONDOM   Final    Culture  Setup Time  [id number]   Final    Colony Count PENDING   Incomplete    Culture Culture reincubated for better growth   Final    Report Status PENDING   Incomplete   CULTURE, SPUTUM-ASSESSMENT     Status: Normal   Collection Time   05/19/11 11:16 AM      Component Value Range Status Comment   Specimen Description SPUTUM   Final    Special Requests NONE   Final    Sputum evaluation     Final    Value: MICROSCOPIC FINDINGS SUGGEST THAT THIS SPECIMEN IS NOT REPRESENTATIVE OF LOWER RESPIRATORY SECRETIONS. PLEASE RECOLLECT.     Gram Stain Report Called to,Read Back By and Verified With: Wu RN 11:55 05/19/11 ([email or web address])   Report Status 05/19/2011 FINAL   Final   CULTURE, SPUTUM-ASSESSMENT     Status: Normal   Collection Time   05/19/11  5:52 PM      Component Value Range Status Comment   Specimen Description SPUTUM   Final    Special Requests Immunocompromised   Final    Sputum evaluation     Final    Value: MICROSCOPIC FINDINGS SUGGEST THAT THIS SPECIMEN IS NOT REPRESENTATIVE OF LOWER RESPIRATORY SECRETIONS. PLEASE RECOLLECT.     CALLED TO RN D.JOHNSON AT 2016 BY L.PITT 0/29/13   Report Status 05/19/2011 FINAL   Final     Coagulation Studies: No results found for this basename: LABPROT:3,INR:3 in the last 72 hours  Urinalysis: No results found for this basename:  COLORURINE:2,APPERANCEUR:2,LABSPEC:2,PHURINE:2,GLUCOSEU:2,HGBUR:2,BILIRUBINUR:2,KETONESUR:2,PROTEINUR:2,UROBILINOGEN:2,NITRITE:2,LEUKOCYTESUR:2 in the last 72 hours    Imaging: Dg Chest 2 View  05/18/2011  *RADIOLOGY REPORT*  Clinical Data: Pneumonia.  Edema.  CHEST - 2 VIEW  Comparison: 05/10/2011.  Findings: Interval enlargement of the cardiac silhouette with interval airspace opacity throughout the majority of both lungs. Decreased right lateral pleural fluid.  Stable post CABG changes.  IMPRESSION:  1.  Interval cardiomegaly and changes of acute congestive heart failure with diffuse alveolar edema. 2.  Decreased right pleural fluid.  Original Report Authenticated By: Darrol Angel, M.D.   Dg Chest Port 1 View  05/20/2011  *RADIOLOGY REPORT*  Clinical Data: Pulmonary edema.  PORTABLE CHEST - 1 VIEW  Comparison: 05/19/2011.  Findings: Worsening pulmonary aeration is present with increasing basilar predominant and perihilar airspace disease.  Cardiomegaly. Median sternotomy.  IMPRESSION: Constellation of findings compatible with worsening CHF, now moderate to severe.  Original Report Authenticated By: Andreas Newport, M.D.   Dg Chest Port 1 View  05/19/2011  *RADIOLOGY REPORT*  Clinical Data: Hypoxia.  Pulmonary edema.  Coronary artery disease. Postop from back surgery.  PORTABLE CHEST - 1 VIEW  Comparison: 05/18/2011  Findings: Cardiomegaly stable.  Improved aeration of both lungs is seen.  Pulmonary interstitial edema pattern is not simply changed allowing for increased lung volumes.  Probable small bilateral pleural effusions are noted.  IMPRESSION:  1.  Improved aeration of both lungs. 2.  No significant change in diffuse interstitial edema and cardiomegaly.  Probable small bilateral pleural effusions.  Original Report Authenticated By: Danae Orleans, M.D.      Assessment & Plan: Patient is a 71 y.o. male with a PMHx of Kidney disease status post renal transplant (2002) with Creatinine baseline  of 1.1-1.3, hypertension, diabetes on insulin pump, coronary artery disease status post CABG, COPD, hyperlipidemia, chronic back pain with lumbar degenerative disc disease  (spondylosis and herniated nucleus pulposus L5-S1 with bilateral radical opacity )who was admitted to [facility] on 05/16/2011  for  Bilateral laminotomies and  decompression of L5 and S1 nerve roots by Dr. Danielle Dess.    1. Kidney disease status post renal transplant in 2002 with a creatinine baseline of 1.1-1.3. Home regimen includes Prograf 5mg  bid, CellCept 500mg  bid and prednisone 5 mg/d . Was noted to have mildly elevated creatinine 1.4 on March 29 in the setting of acute on chronic diastolic congestive heart failure. The patient was initially on Prograf and CellCept he was started on Solu Cortef 100 mg on March 30 2 q8 hours. The patient has been receiving IV lasix since SOB started and is currently on 40 mg IV twice a day. Not sure why patient has decompensated CHF. Renal function is not bad enough to explain this, would get ECHO (last one was June 2012, increase lasix 80 IV q 8hrs, watch resp status and CXR, continue current immunosuppresants. Order UA also. Discussed with pt's wife at length.   2.Acute on Diastolic congestive heart failure: Lasix was started on March 28 . BNP elevated to 2714 , chest x-ray  Consistent with pulmonary edema .  Currently on Lasix 40 mg IV twice a day.  - Per cardiology  3.Diabetes currently on sliding scale insulin, Insulin pump, Linagliptin.  - Per internal medicine team  4.COPD home oxygen dependent .  Currently on Spiriva and Advair - Per pulmonology  5.Hyperlipidemia: Continue statin  6.Coronary artery disease status post CABG: Currently on Aspirin.  Plavix on hold.  - Per cardiology  7.A. Fib: Rate controlled  8.Seizure ?: Currently on Lamictal  9.Fever on March 28 of 100.1. Elevated WBCs. No Pneumonia on chest x-ray. Blood cultures obtained which are negative  up to date.   10. Anemia  likely due to chronic disease with baseline Hgb of 9 to 10. Today 8.8. Anemia panel 03/2009 Iron 20,TIBC 140,  Ferritin 407, Folate >20 - consider Anemia panel    DVT PPX - SCD   Patient history and plan of care reviewed with attending, Dr. Arlean Hopping.   Danielle Rankin, Internal Medicine Resident 05/20/2011, 1:31 PM  Patient seen and examined and agree with assessment and plan as above with additions in bold.   Vinson Moselle  MD [employer] [phone] pgr    [phone] cell 05/20/2011, 3:49 PM

## 2011-05-21 ENCOUNTER — Inpatient Hospital Stay (HOSPITAL_COMMUNITY): Payer: Medicare Other

## 2011-05-21 ENCOUNTER — Other Ambulatory Visit: Payer: Self-pay

## 2011-05-21 LAB — RENAL FUNCTION PANEL
Albumin: 2.4 g/dL — ABNORMAL LOW (ref 3.5–5.2)
Calcium: 10.1 mg/dL (ref 8.4–10.5)
Chloride: 97 mEq/L (ref 96–112)
Creatinine, Ser: 1.44 mg/dL — ABNORMAL HIGH (ref 0.50–1.35)
GFR calc non Af Amer: 47 mL/min — ABNORMAL LOW (ref >90)
Phosphorus: 3.9 mg/dL (ref 2.3–4.6)

## 2011-05-21 LAB — GLUCOSE, CAPILLARY
Glucose-Capillary: 193 mg/dL — ABNORMAL HIGH (ref 70–99)
Glucose-Capillary: 237 mg/dL — ABNORMAL HIGH (ref 70–99)
Glucose-Capillary: 250 mg/dL — ABNORMAL HIGH (ref 70–99)
Glucose-Capillary: 279 mg/dL — ABNORMAL HIGH (ref 70–99)
Glucose-Capillary: 281 mg/dL — ABNORMAL HIGH (ref 70–99)

## 2011-05-21 LAB — CBC
MCH: 21.5 pg — ABNORMAL LOW (ref 26.0–34.0)
MCV: 70.7 fL — ABNORMAL LOW (ref 78.0–100.0)
Platelets: 178 10*3/uL (ref 150–400)
RBC: 4.33 MIL/uL (ref 4.22–5.81)

## 2011-05-21 LAB — CARDIAC PANEL(CRET KIN+CKTOT+MB+TROPI): Total CK: 36 U/L (ref 7–232)

## 2011-05-21 LAB — POCT I-STAT 3, ART BLOOD GAS (G3+)
Acid-Base Excess: 7 mmol/L — ABNORMAL HIGH (ref 0.0–2.0)
Patient temperature: 98.4
pH, Arterial: 7.409 (ref 7.350–7.450)

## 2011-05-21 LAB — IRON AND TIBC: UIBC: 113 ug/dL — ABNORMAL LOW (ref 125–400)

## 2011-05-21 LAB — MRSA PCR SCREENING: MRSA by PCR: NEGATIVE

## 2011-05-21 MED ORDER — PIPERACILLIN-TAZOBACTAM 3.375 G IVPB
3.3750 g | Freq: Three times a day (TID) | INTRAVENOUS | Status: DC
  Administered 2011-05-21 – 2011-05-31 (×32): 3.375 g via INTRAVENOUS
  Filled 2011-05-21 (×33): qty 50

## 2011-05-21 MED ORDER — HALOPERIDOL LACTATE 5 MG/ML IJ SOLN
1.0000 mg | INTRAMUSCULAR | Status: DC | PRN
  Administered 2011-05-21 – 2011-05-23 (×5): 4 mg via INTRAVENOUS
  Filled 2011-05-21 (×8): qty 1

## 2011-05-21 MED ORDER — INSULIN GLARGINE 100 UNIT/ML ~~LOC~~ SOLN
10.0000 [IU] | Freq: Every day | SUBCUTANEOUS | Status: DC
  Administered 2011-05-21: 10 [IU] via SUBCUTANEOUS

## 2011-05-21 MED ORDER — LEVALBUTEROL HCL 0.63 MG/3ML IN NEBU
0.6300 mg | INHALATION_SOLUTION | Freq: Four times a day (QID) | RESPIRATORY_TRACT | Status: DC
  Administered 2011-05-21 – 2011-05-30 (×35): 0.63 mg via RESPIRATORY_TRACT
  Filled 2011-05-21 (×38): qty 3

## 2011-05-21 MED ORDER — IPRATROPIUM BROMIDE 0.02 % IN SOLN
0.5000 mg | Freq: Four times a day (QID) | RESPIRATORY_TRACT | Status: DC
  Administered 2011-05-21 – 2011-05-30 (×37): 0.5 mg via RESPIRATORY_TRACT
  Filled 2011-05-21 (×37): qty 2.5

## 2011-05-21 MED ORDER — LORAZEPAM 2 MG/ML IJ SOLN
INTRAMUSCULAR | Status: AC
  Administered 2011-05-21: 1 mg via INTRAVENOUS
  Filled 2011-05-21: qty 1

## 2011-05-21 MED ORDER — INSULIN ASPART 100 UNIT/ML ~~LOC~~ SOLN
0.0000 [IU] | SUBCUTANEOUS | Status: DC
  Administered 2011-05-21: 7 [IU] via SUBCUTANEOUS
  Administered 2011-05-21: 11 [IU] via SUBCUTANEOUS
  Administered 2011-05-21: 7 [IU] via SUBCUTANEOUS
  Administered 2011-05-22: 11 [IU] via SUBCUTANEOUS
  Administered 2011-05-22 (×2): 15 [IU] via SUBCUTANEOUS
  Administered 2011-05-22 (×2): 4 [IU] via SUBCUTANEOUS
  Administered 2011-05-22: 7 [IU] via SUBCUTANEOUS
  Administered 2011-05-23: 4 [IU] via SUBCUTANEOUS
  Administered 2011-05-23 (×4): 7 [IU] via SUBCUTANEOUS
  Administered 2011-05-23: 4 [IU] via SUBCUTANEOUS
  Administered 2011-05-23: 7 [IU] via SUBCUTANEOUS
  Administered 2011-05-24: 4 [IU] via SUBCUTANEOUS
  Administered 2011-05-24: 3 [IU] via SUBCUTANEOUS
  Administered 2011-05-24: 4 [IU] via SUBCUTANEOUS
  Administered 2011-05-24: 3 [IU] via SUBCUTANEOUS
  Administered 2011-05-24: 4 [IU] via SUBCUTANEOUS
  Administered 2011-05-25: 7 [IU] via SUBCUTANEOUS
  Administered 2011-05-25: 3 [IU] via SUBCUTANEOUS
  Administered 2011-05-25 (×3): 4 [IU] via SUBCUTANEOUS
  Administered 2011-05-26: 7 [IU] via SUBCUTANEOUS
  Administered 2011-05-26 (×2): 15 [IU] via SUBCUTANEOUS
  Administered 2011-05-26: 11 [IU] via SUBCUTANEOUS
  Administered 2011-05-26: 7 [IU] via SUBCUTANEOUS
  Administered 2011-05-26: 15 [IU] via SUBCUTANEOUS
  Administered 2011-05-26: 4 [IU] via SUBCUTANEOUS
  Administered 2011-05-27: 20 [IU] via SUBCUTANEOUS
  Administered 2011-05-27: 15 [IU] via SUBCUTANEOUS
  Administered 2011-05-27: 20 [IU] via SUBCUTANEOUS
  Administered 2011-05-27: 11 [IU] via SUBCUTANEOUS
  Administered 2011-05-27: 20 [IU] via SUBCUTANEOUS
  Administered 2011-05-28 (×3): 15 [IU] via SUBCUTANEOUS
  Administered 2011-05-28 (×4): 20 [IU] via SUBCUTANEOUS
  Administered 2011-05-29: 11 [IU] via SUBCUTANEOUS
  Administered 2011-05-29: 15 [IU] via SUBCUTANEOUS

## 2011-05-21 MED ORDER — VANCOMYCIN HCL IN DEXTROSE 1-5 GM/200ML-% IV SOLN
1000.0000 mg | Freq: Two times a day (BID) | INTRAVENOUS | Status: DC
  Administered 2011-05-21 – 2011-05-23 (×5): 1000 mg via INTRAVENOUS
  Filled 2011-05-21 (×6): qty 200

## 2011-05-21 MED ORDER — LORAZEPAM 2 MG/ML IJ SOLN
1.0000 mg | Freq: Four times a day (QID) | INTRAMUSCULAR | Status: DC | PRN
  Administered 2011-05-21 – 2011-05-24 (×7): 1 mg via INTRAVENOUS
  Filled 2011-05-21 (×6): qty 1

## 2011-05-21 NOTE — Consult Note (Signed)
Name: John [name] MRN: [id number] DOB: 05/25/1940    LOS: 5  [facility]  History of Present Illness:  71 yo male with hx COPD, renal transplant (2002), DM on insulin pump who presented 3/26 for lumbar laminotomies and decompression L5 and S1 r/t spondylosis and herniated nucleus pulposus.  Post op developed episodes of confusion and respiratory distress thought r/t CHF  Which initially improved with lasix.  On 3/31 developed worsening respiratory distress and hypoxia and PCCM consulted.   Lines / Drains:   Cultures/sepsis markers: Urine 3/29>>> BCx2 3/28>>> Sputum 3/31>>> Pct 3/31>>>  Antibiotics: Vanc 3/31>>> Zosyn 3/31>>>  Tests / Events: 3/31>> resp distress, tx to ICU    Past Medical History  Diagnosis Date  . CAD (coronary artery disease)     a. myoview 1/11: lg inf fixed defect;   b. cath 1/11: 3v CAD,  c. s/p CABG 1/11 c/b acute renal failure and AFib  . Atrial fibrillation   . Chronic diastolic heart failure     a. echo 4/11: EF 45-50%, mod LVH, LAE, inf and septal HK  . First degree AV block   . Orthostatic hypotension     pyridostigmine  . History of renal transplant   . DM2 (diabetes mellitus, type 2)   . Diabetic gastroparesis   . HTN (hypertension)   . HLD (hyperlipidemia)   . PAD (peripheral artery disease)     s/p R SFA-Pop atherectomy 11/11  . Prostate cancer   . ED (erectile dysfunction)   . DDD (degenerative disc disease), lumbar   . Tremor   . Obesity   . Low testosterone   . Chronic kidney disease     s/p renal transplant 2002  . Pleural effusion, right     loculated  . COPD (chronic obstructive pulmonary disease)   . Anxiety   . Diabetes mellitus     insulin pump   Past Surgical History  Procedure Date  . Coronary artery bypass graft 03/2009  . Cataract extraction   . Vitrectomy   . Tonsillectomy   . Kidney transplant 2002  . Hernia repair   . Prostate cancer rx 2006 brachytherapy   . Prior peritoneal catheter placement   .  Lumbar laminectomy/decompression microdiscectomy 05/16/2011    Procedure: LUMBAR LAMINECTOMY/DECOMPRESSION MICRODISCECTOMY 1 LEVEL;  Surgeon: Barnett Abu, MD;  Location: [facility];  Service: Neurosurgery;  Laterality: Bilateral;  Bilateral Lumbar Five-Sacral One Laminectomy   Prior to Admission medications   Medication Sig Start Date End Date Taking? Authorizing Provider  albuterol (PROVENTIL HFA;VENTOLIN HFA) 108 (90 BASE) MCG/ACT inhaler Inhale 2 puffs into the lungs every 6 (six) hours as needed for wheezing. 08/26/10 08/26/11 Yes Nyoka Cowden, MD  aspirin EC 81 MG tablet Take 1 tablet (81 mg total) by mouth daily. 07/12/10 07/12/11 Yes Laurey Morale, MD  atorvastatin (LIPITOR) 40 MG tablet Take 40 mg by mouth daily.     Yes Historical Provider, MD  calcium carbonate (OS-CAL - DOSED IN MG OF ELEMENTAL CALCIUM) 1250 MG tablet Take 2 tablets by mouth daily.   Yes Historical Provider, MD  carbidopa-levodopa (SINEMET CR) 25-100 MG per tablet Take 1 tablet by mouth 3 (three) times daily.     Yes Historical Provider, MD  clopidogrel (PLAVIX) 75 MG tablet Take 75 mg by mouth daily.   Yes Historical Provider, MD  ezetimibe (ZETIA) 10 MG tablet Take 10 mg by mouth daily.     Yes Historical Provider, MD  Ferrous Sulfate (IRON) 325 (  65 FE) MG TABS Take 1 tablet by mouth daily.    Yes Historical Provider, MD  Fluticasone-Salmeterol (ADVAIR DISKUS) 250-50 MCG/DOSE AEPB Inhale 1 puff into the lungs every 12 (twelve) hours.     Yes Historical Provider, MD  folic acid (FOLVITE) 800 MCG tablet Take 800 mcg by mouth daily.   Yes Historical Provider, MD  furosemide (LASIX) 40 MG tablet Take 2 tablets in the am and 1 tablet in the pm daily by mouth 02/27/11  Yes Laurey Morale, MD  Insulin Aspart (NOVOLOG [place]) Inject into the skin continuous. Via pump as directed    Yes Historical Provider, MD  isosorbide mononitrate (IMDUR) 60 MG 24 hr tablet Take 1 tablet (60 mg total) by mouth daily. 01/31/11  Yes Laurey Morale, MD  LamoTRIgine (LAMICTAL ODT PO) Take 200 mg by mouth daily.    Yes Historical Provider, MD  magnesium oxide (MAG-OX) 400 MG tablet Take 800 mg by mouth 2 (two) times daily.    Yes Historical Provider, MD  Multiple Vitamin (MULITIVITAMIN WITH MINERALS) TABS Take 1 tablet by mouth daily.   Yes Historical Provider, MD  mycophenolate (CELLCEPT) 500 MG tablet Take 500 mg by mouth 2 (two) times daily.    Yes Historical Provider, MD  oxyCODONE (OXY IR/ROXICODONE) 5 MG immediate release tablet Take 1 tablet by mouth 2 (two) times daily as needed. For pain 12/14/10  Yes Historical Provider, MD  pantoprazole (PROTONIX) 40 MG tablet Take 40 mg by mouth 2 (two) times daily.    Yes Historical Provider, MD  predniSONE (DELTASONE) 5 MG tablet Take 5 mg by mouth daily.     Yes Historical Provider, MD  pyridostigmine (MESTINON) 60 MG tablet Take 60 mg by mouth 3 (three) times daily.   Yes Historical Provider, MD  risperiDONE (RISPERDAL) 2 MG tablet Take 1 tablet by mouth Daily.   Yes Historical Provider, MD  saxagliptin HCl (ONGLYZA) 5 MG TABS tablet Take 5 mg by mouth daily.     Yes Historical Provider, MD  tacrolimus (PROGRAF) 5 MG capsule Take 5 mg by mouth 2 (two) times daily.     Yes Historical Provider, MD  Tamsulosin HCl (FLOMAX) 0.4 MG CAPS Take 0.4 mg by mouth daily.    Yes Historical Provider, MD  temazepam (RESTORIL) 15 MG capsule Take 15 mg by mouth at bedtime as needed. For sleep   Yes Historical Provider, MD  tiotropium (SPIRIVA HANDIHALER) 18 MCG inhalation capsule Place 1 capsule (18 mcg total) into inhaler and inhale daily. 02/17/11 02/17/12 Yes Lupita Leash, MD  Vilazodone HCl (VIIBRYD) 40 MG TABS Take 20 mg by mouth daily.    Yes Historical Provider, MD  vitamin B-12 (CYANOCOBALAMIN) 500 MCG tablet Take 500 mcg by mouth daily.   Yes Historical Provider, MD  VITAMIN D, CHOLECALCIFEROL, PO Take 10,000 Units by mouth 3 (three) times a week. On Monday, Wednesday, and Friday   Yes  Historical Provider, MD  clobetasol (OLUX) 0.05 % topical foam Apply 1 application topically daily as needed. For head 12/12/10   Historical Provider, MD   Allergies No Known Allergies  Family History Family History  Problem Relation Age of Onset  . Coronary artery disease Mother   . Valvular heart disease Father   . Hepatitis      sibling  . Coronary artery disease Sister   . Heart attack Sister   . Tuberculosis Father     Social History  reports that he quit smoking about 26  years ago. His smoking use included Cigarettes. He has a 40 pack-year smoking history. He has never used smokeless tobacco. He reports that he does not drink alcohol or use illicit drugs.  Review Of Systems    Vital Signs: Temp:  [97.5 F (36.4 C)-99.1 F (37.3 C)] 98.5 F (36.9 C) (03/31 0643) Pulse Rate:  [60-91] 60  (03/31 0643) Resp:  [19-28] 28  (03/31 0643) BP: (111-171)/(39-72) 171/72 mmHg (03/31 0643) SpO2:  [96 %-100 %] 98 % (03/31 0746) FiO2 (%):  [4 %-40 %] 40 % (03/31 0746) I/O last 3 completed shifts: In: 150 [P.O.:150] Out: 1000 [Urine:1000]  Physical Examination:  General: chronically ill appearing male, mild distress Neuro: awake, alert, confused, MAE, gen weakness CV: s1s2 rrr, no m/r/g PULM: resps even, mildly labored, diminished throughout with few scattered ronchi GI: abd soft, +bs Extremities:  Warm and dry, scant BLE edema     Labs and Imaging:   CBC    Component Value Date/Time   WBC 9.3 05/21/2011 0612   RBC 4.33 05/21/2011 0612   HGB 9.3* 05/21/2011 0612   HCT 30.6* 05/21/2011 0612   PLT 178 05/21/2011 0612   MCV 70.7* 05/21/2011 0612   MCH 21.5* 05/21/2011 0612   MCHC 30.4 05/21/2011 0612   RDW 18.6* 05/21/2011 0612   LYMPHSABS 1.1 05/20/2011 0630   MONOABS 0.9 05/20/2011 0630   EOSABS 0.1 05/20/2011 0630   BASOSABS 0.0 05/20/2011 0630    BMET    Component Value Date/Time   NA 138 05/21/2011 0612   K 4.9 05/21/2011 0612   CL 97 05/21/2011 0612   CO2 31  05/21/2011 0612   GLUCOSE 198* 05/21/2011 0612   BUN 50* 05/21/2011 0612   CREATININE 1.44* 05/21/2011 0612   CALCIUM 10.1 05/21/2011 0612   GFRNONAA 47* 05/21/2011 0612   GFRAA 55* 05/21/2011 0612    No results found for this basename: INR in the last 168 hours  ABG    Component Value Date/Time   PHART 7.411 05/20/2011 0945   PCO2ART 52.0* 05/20/2011 0945   PO2ART 86.6 05/20/2011 0945   HCO3 32.3* 05/20/2011 0945   TCO2 33.9 05/20/2011 0945   ACIDBASEDEF 1.0 04/22/2009 0905   O2SAT 97.7 05/20/2011 0945    Dg Chest 2 View  05/21/2011  *RADIOLOGY REPORT*  Clinical Data: Shortness of breath  CHEST - 2 VIEW  Comparison:   the previous day's study  Findings: Previous CABG.  Moderate diffuse airspace opacities, relatively improved in the lung bases since previous exam. Right lateral effusion or pleural thickening.  Heart size upper limits normal.  IMPRESSION:  1.  Partial improvement in bilateral infiltrates or edema.  Original Report Authenticated By: Osa Craver, M.D.   Dg Chest Port 1 View  05/20/2011  *RADIOLOGY REPORT*  Clinical Data: Pulmonary edema.  PORTABLE CHEST - 1 VIEW  Comparison: 05/19/2011.  Findings: Worsening pulmonary aeration is present with increasing basilar predominant and perihilar airspace disease.  Cardiomegaly. Median sternotomy.  IMPRESSION: Constellation of findings compatible with worsening CHF, now moderate to severe.  Original Report Authenticated By: Andreas Newport, M.D.   Dg Chest Port 1 View  05/19/2011  *RADIOLOGY REPORT*  Clinical Data: Hypoxia.  Pulmonary edema.  Coronary artery disease. Postop from back surgery.  PORTABLE CHEST - 1 VIEW  Comparison: 05/18/2011  Findings: Cardiomegaly stable.  Improved aeration of both lungs is seen.  Pulmonary interstitial edema pattern is not simply changed allowing for increased lung volumes.  Probable small bilateral pleural effusions  are noted.  IMPRESSION:  1.  Improved aeration of both lungs. 2.  No significant change  in diffuse interstitial edema and cardiomegaly.  Probable small bilateral pleural effusions.  Original Report Authenticated By: Danae Orleans, M.D.      Assessment and Plan:  Acute respiratory failure -- likely multifactorial in setting volume overload with underlying COPD +/- HCAP post lumbar surgery in immunosuppressed patient.  PLAN -  O2  Cont diuresis as BP, Scr allow  Empiric abx for ?HCAP BD  High risk for intubation but can likely hold off for now pulm hygiene  F/u cxr  Recheck ABG now   DM - home insulin pump. PLAN -  Will d/c insulin pump while critically ill  Add SSI   HTN -  PLAN -  Cont diuresis May need to add low dose rx if cont to trend up  Chronic renal insufficiency s/p renal transplant 2002 - renal following.  PLAN -  Cont cellcept, prograf Diuresis per renal   Afib -- rate controlled  PLAN -   Fever -- no leukocytosis, immunosuppressed pt.  PLAN -  Empiric abx for ?HCAP Trend WBC Check pct    Best practices / Disposition: -->ICU status under PCCM -->full code -->SCD's for DVT Px -->Protonix for GI Px    WHITEHEART,KATHRYN, NP 05/21/2011  8:40 AM Pager: [phone]  *Care during the described time interval was provided by me and/or other providers on the critical care team. I have reviewed this patient's available data, including medical history, events of note, physical examination and test results as part of my evaluation.   I have seen and examined this patient with K Whiteheart and agree with the above note. Shan Levans Beeper  [phone]  Cell  [phone]  If no response or cell goes to voicemail, call beeper [phone]

## 2011-05-21 NOTE — Progress Notes (Signed)
eLink Physician-Brief Progress Note Patient Name: GREGORIO WORLEY DOB: [birth year] MRN: [id number]  Date of Service  05/21/2011   HPI/Events of Note   CBGs high on resistant SSI, on steroids On insulin pump at home  eICU Interventions  Add lantus 10    Intervention Category Intermediate Interventions: Hyperglycemia - evaluation and treatment  Moritz Lever V. 05/21/2011, 4:10 PM

## 2011-05-21 NOTE — Progress Notes (Signed)
eLink Physician-Brief Progress Note Patient Name: ARMAND PREAST DOB: [birth year] MRN: [id number]  Date of Service  05/21/2011   HPI/Events of Note   Takes risperdal at home & other psych & pain meds Unable to take PO at this time  eICU Interventions  Haldol prn with qT monitoring   Intervention Category Minor Interventions: Agitation / anxiety - evaluation and management  Retal Tonkinson V. 05/21/2011, 5:57 PM

## 2011-05-21 NOTE — Procedures (Signed)
Central Venous Catheter Insertion Procedure Note John [name] [id number] 09/03/1940  Procedure: Insertion of Central Venous Catheter Indications: Assessment of intravascular volume and Drug and/or fluid administration  Procedure Details Consent: Risks of procedure as well as the alternatives and risks of each were explained to the (patient/caregiver).  Consent for procedure obtained. Time Out: Verified patient identification, verified procedure, site/side was marked, verified correct patient position, special equipment/implants available, medications/allergies/relevent history reviewed, required imaging and test results available.  Performed  Maximum sterile technique was used including antiseptics, cap, gloves, gown, hand hygiene, mask and sheet. Skin prep: Chlorhexidine; local anesthetic administered A antimicrobial bonded/coated triple lumen catheter was placed in the left internal jugular vein using the Seldinger technique.  Evaluation Blood flow good Complications: No apparent complications Patient did tolerate procedure well. Chest X-ray ordered to verify placement.  CXR: pending.  Using ultrasound guidance, performed under direct MD supervision.   WHITEHEART,KATHRYN, NP 05/21/2011, 9:53 AM  I was present for the entire procedure. Shan Levans Beeper  [phone]  Cell  [phone]  If no response or cell goes to voicemail, call beeper [phone]

## 2011-05-21 NOTE — Progress Notes (Signed)
ANTIBIOTIC CONSULT NOTE - INITIAL  Pharmacy Consult for vancomycin + zosyn Indication: rule out pneumonia  No Known Allergies  Patient Measurements: Height: 6\' 2"  (188 cm) Weight: 227 lb 8.2 oz (103.2 kg) IBW/kg (Calculated) : 82.2   Vital Signs: Temp: 98.5 F (36.9 C) (03/31 0643) Temp src: Axillary (03/31 0643) BP: 171/72 mmHg (03/31 0643) Pulse Rate: 60  (03/31 0643) Intake/Output from previous day: 03/30 0701 - 03/31 0700 In: -  Out: 750 [Urine:750] Intake/Output from this shift:    Labs:  Basename 05/21/11 0612 05/20/11 0630 05/19/11 1050  WBC 9.3 10.2 --  HGB 9.3* 8.8* --  PLT 178 151 --  LABCREA -- -- --  CREATININE 1.44* 1.36* 1.33   Estimated Creatinine Clearance: 60.3 ml/min (by C-G formula based on Cr of 1.44). No results found for this basename: VANCOTROUGH:2,VANCOPEAK:2,VANCORANDOM:2,GENTTROUGH:2,GENTPEAK:2,GENTRANDOM:2,TOBRATROUGH:2,TOBRAPEAK:2,TOBRARND:2,AMIKACINPEAK:2,AMIKACINTROU:2,AMIKACIN:2, in the last 72 hours   Microbiology: Recent Results (from the past 720 hour(s))  SURGICAL PCR SCREEN     Status: Normal   Collection Time   05/10/11  1:34 PM      Component Value Range Status Comment   MRSA, PCR NEGATIVE  NEGATIVE  Final    Staphylococcus aureus NEGATIVE  NEGATIVE  Final   CULTURE, BLOOD (ROUTINE X 2)     Status: Normal (Preliminary result)   Collection Time   05/18/11  6:45 PM      Component Value Range Status Comment   Specimen Description BLOOD LEFT ARM   Final    Special Requests BOTTLES DRAWN AEROBIC ONLY 10CC   Final    Culture  Setup Time [id number]   Final    Culture     Final    Value:        BLOOD CULTURE RECEIVED NO GROWTH TO DATE CULTURE WILL BE HELD FOR 5 DAYS BEFORE ISSUING A FINAL NEGATIVE REPORT   Report Status PENDING   Incomplete   CULTURE, BLOOD (ROUTINE X 2)     Status: Normal (Preliminary result)   Collection Time   05/18/11  6:50 PM      Component Value Range Status Comment   Specimen Description BLOOD RIGHT HAND    Final    Special Requests BOTTLES DRAWN AEROBIC ONLY 10CC   Final    Culture  Setup Time [id number]   Final    Culture     Final    Value:        BLOOD CULTURE RECEIVED NO GROWTH TO DATE CULTURE WILL BE HELD FOR 5 DAYS BEFORE ISSUING A FINAL NEGATIVE REPORT   Report Status PENDING   Incomplete   URINE CULTURE     Status: Normal (Preliminary result)   Collection Time   05/19/11 10:33 AM      Component Value Range Status Comment   Specimen Description URINE, CATHETERIZED   Final    Special Requests CONDOM   Final    Culture  Setup Time [id number]   Final    Colony Count PENDING   Incomplete    Culture Culture reincubated for better growth   Final    Report Status PENDING   Incomplete   CULTURE, SPUTUM-ASSESSMENT     Status: Normal   Collection Time   05/19/11 11:16 AM      Component Value Range Status Comment   Specimen Description SPUTUM   Final    Special Requests NONE   Final    Sputum evaluation     Final    Value: MICROSCOPIC FINDINGS SUGGEST THAT THIS SPECIMEN IS  NOT REPRESENTATIVE OF LOWER RESPIRATORY SECRETIONS. PLEASE RECOLLECT.     Gram Stain Report Called to,Read Back By and Verified With: Wu RN 11:55 05/19/11 ([email or web address])   Report Status 05/19/2011 FINAL   Final   CULTURE, SPUTUM-ASSESSMENT     Status: Normal   Collection Time   05/19/11  5:52 PM      Component Value Range Status Comment   Specimen Description SPUTUM   Final    Special Requests Immunocompromised   Final    Sputum evaluation     Final    Value: MICROSCOPIC FINDINGS SUGGEST THAT THIS SPECIMEN IS NOT REPRESENTATIVE OF LOWER RESPIRATORY SECRETIONS. PLEASE RECOLLECT.     CALLED TO RN D.JOHNSON AT 2016 BY L.PITT 0/29/13   Report Status 05/19/2011 FINAL   Final     Medical History: Past Medical History  Diagnosis Date  . CAD (coronary artery disease)     a. myoview 1/11: lg inf fixed defect;   b. cath 1/11: 3v CAD,  c. s/p CABG 1/11 c/b acute renal failure and AFib  . Atrial fibrillation   . Chronic  diastolic heart failure     a. echo 4/11: EF 45-50%, mod LVH, LAE, inf and septal HK  . First degree AV block   . Orthostatic hypotension     pyridostigmine  . History of renal transplant   . DM2 (diabetes mellitus, type 2)   . Diabetic gastroparesis   . HTN (hypertension)   . HLD (hyperlipidemia)   . PAD (peripheral artery disease)     s/p R SFA-Pop atherectomy 11/11  . Prostate cancer   . ED (erectile dysfunction)   . DDD (degenerative disc disease), lumbar   . Tremor   . Obesity   . Low testosterone   . Chronic kidney disease     s/p renal transplant 2002  . Pleural effusion, right     loculated  . COPD (chronic obstructive pulmonary disease)   . Anxiety   . Diabetes mellitus     insulin pump    Medications:  Anti-infectives     Start     Dose/Rate Route Frequency Ordered Stop   05/21/11 1000  piperacillin-tazobactam (ZOSYN) IVPB 3.375 g       3.375 g 12.5 mL/hr over 240 Minutes Intravenous Every 8 hours 05/21/11 0855     05/21/11 1000   vancomycin (VANCOCIN) IVPB 1000 mg/200 mL premix        1,000 mg 200 mL/hr over 60 Minutes Intravenous Every 12 hours 05/21/11 0855     05/16/11 0932   bacitracin 50,000 Units in sodium chloride irrigation 0.9 % 500 mL irrigation  Status:  Discontinued          As needed 05/16/11 0932 05/16/11 1055   05/16/11 0700   bacitracin 16109 UNITS injection     Comments: Imagene Gurney: cabinet override         05/16/11 0700 05/16/11 1914   05/16/11 0000   ceFAZolin (ANCEF) IVPB 2 g/50 mL premix        2 g 100 mL/hr over 30 Minutes Intravenous 60 min pre-op 05/15/11 1449 05/16/11 0828         Assessment: 70 yom admitted for elective back surgery, now s/p laminectomy and decompression of L5 S1 on 3/26. Now pt with worsening confusion to start on empiric vancomycin + zosyn for possible pneumonia. Pt is afebrile, WBC WNL. Cultures NGTD. Noted pt with history of renal transplant with high vancomycin troughs in the past. However, his  Scr  was worse at that time than now but his Scr has been trending up the last few days and now is at 1.44. Will need to monitor closely.   Goal of Therapy:  Vancomycin trough level 15-20 mcg/ml  Plan:  Measure antibiotic drug levels at steady state Follow up culture results Zosyn 3.375gm IV Q8H (4 hour infusion) Vancomycin 1gm IV Q12H Watch renal function closely  Freja Faro, Drake Leach 05/21/2011,9:05 AM

## 2011-05-21 NOTE — Progress Notes (Signed)
Patient ID: John [name], male   DOB: 08-30-1940, 71 y.o.   MRN: [id number] Subjective: Patient reports Shortness of breath  Objective: Vital signs in last 24 hours: Temp:  [97.5 F (36.4 C)-98.8 F (37.1 C)] 98.5 F (36.9 C) (03/31 0643) Pulse Rate:  [60-91] 82  (03/31 1000) Resp:  [19-28] 23  (03/31 1000) BP: (128-171)/(47-72) 132/58 mmHg (03/31 1000) SpO2:  [96 %-100 %] 100 % (03/31 1150) FiO2 (%):  [40 %] 40 % (03/31 1150)  Intake/Output from previous day: 03/30 0701 - 03/31 0700 In: -  Out: 750 [Urine:750] Intake/Output this shift:    awake, alert. Moves all extremities well. Breathing a bit rapidly. Seems anxious and fidgety.  Lab Results:  Basename 05/21/11 0612 05/20/11 0630  WBC 9.3 10.2  HGB 9.3* 8.8*  HCT 30.6* 28.1*  PLT 178 151   BMET  Basename 05/21/11 0612 05/20/11 0630  NA 138 136  K 4.9 4.8  CL 97 99  CO2 31 31  GLUCOSE 198* 183*  BUN 50* 37*  CREATININE 1.44* 1.36*  CALCIUM 10.1 9.6    Studies/Results: Dg Chest 2 View  05/21/2011  *RADIOLOGY REPORT*  Clinical Data: Shortness of breath  CHEST - 2 VIEW  Comparison:   the previous day's study  Findings: Previous CABG.  Moderate diffuse airspace opacities, relatively improved in the lung bases since previous exam. Right lateral effusion or pleural thickening.  Heart size upper limits normal.  IMPRESSION:  1.  Partial improvement in bilateral infiltrates or edema.  Original Report Authenticated By: Osa Craver, M.D.   Dg Chest Port 1 View  05/21/2011  *RADIOLOGY REPORT*  Clinical Data: Central line placement.  PORTABLE CHEST - 1 VIEW  Comparison: Earlier film of the same day  Findings: Interval placement of a left IJ central line, tip in the proximal SVC.  No pneumothorax.  Previous CABG.  Some interval worsening of diffuse interstitial edema or infiltrates with central pulmonary vascular congestion.  Stable right lateral pleural thickening or effusion.  IMPRESSION:  1.  Central line  placement to proximal SVC without pneumothorax. 2.  Slight worsening of bilateral infiltrates or edema.  Original Report Authenticated By: Osa Craver, M.D.   Dg Chest Port 1 View  05/20/2011  *RADIOLOGY REPORT*  Clinical Data: Pulmonary edema.  PORTABLE CHEST - 1 VIEW  Comparison: 05/19/2011.  Findings: Worsening pulmonary aeration is present with increasing basilar predominant and perihilar airspace disease.  Cardiomegaly. Median sternotomy.  IMPRESSION: Constellation of findings compatible with worsening CHF, now moderate to severe.  Original Report Authenticated By: Andreas Newport, M.D.    Assessment/Plan: Was doing well from surgery stand point but having medical issues with cardiac and respiratory problems. Appreciate Dr Lynelle Doctor help and medical management.  LOS: 5 days  As above   Reinaldo Meeker, MD 05/21/2011, 12:20 PM

## 2011-05-21 NOTE — Progress Notes (Signed)
  Patient not seen today. Informed by Dr. Delford Field that he will be moved to Step down Unit for closer monitoring. He said PCCM will assume medical care. Dr. Delford Field was informed about patient's insulin pump and we felt it might be best to remove it and manage patient's diabetes by other means. Hospitalist service will sign off for now. Will be available if needed in the future.  Fatim Vanderschaaf 05/21/2011 9:17 AM

## 2011-05-21 NOTE — Progress Notes (Signed)
John [name]  71 y.o.  male  Subjective: More alert, but remains disoriented; denies dyspnea and chest discomfort.  Allergy: Review of patient's allergies indicates no known allergies.  Objective: Vital signs in last 24 hours: Temp:  [97.5 F (36.4 C)-98.8 F (37.1 C)] 98.5 F (36.9 C) (03/31 0643) Pulse Rate:  [60-91] 82  (03/31 1000) Resp:  [19-28] 23  (03/31 1000) BP: (128-171)/(47-72) 132/58 mmHg (03/31 1000) SpO2:  [96 %-100 %] 96 % (03/31 1000) FiO2 (%):  [40 %] 40 % (03/31 0915)  103.2 kg (227 lb 8.2 oz) Body mass index is 29.21 kg/(m^2).  Weight change:  Last BM Date: 05/20/11  Intake/Output from previous day: 03/30 0701 - 03/31 0700 In: -  Out: 750 [Urine:750]  I&O-+35cc  Weight loss of 6 kg overnight-yesterday's wt likely erroneous.  General- Well developed; tachypnea  Neck- mild to mod JVD, no carotid bruits Lungs- basilar rales; normal I:E ratio Cardiovascular- normal PMI; normal S1 and S2; 2/6 basilar systolic ejection murmur Abdomen- decreased bowel sounds; soft and non-tender without masses or organomegaly Skin- Warm, no significant lesions Extremities- Nl distal pulses; 1+ edema  Lab Results: CBC:    Basename 05/21/11 0612 05/20/11 0630  WBC 9.3 10.2  HGB 9.3* 8.8*  HCT 30.6* 28.1*  PLT 178 151   BMET:   Basename 05/21/11 0612 05/20/11 0630  NA 138 136  K 4.9 4.8  CL 97 99  CO2 31 31  GLUCOSE 198* 183*  BUN 50* 37*  CREATININE 1.44* 1.36*  CALCIUM 10.1 9.6   BNP-2714, previously normal  EKG:   Orders placed in visit on 05/10/11  . EKG 12-LEAD  Sinus rhythm with profound 1st degree A-V block; Left anterior fascicular block; ST-T abnormality-lateral ischemia vs. LVH; IVCD  Imaging Studies/Results: Dg Chest 2 View  05/21/2011  *RADIOLOGY REPORT*  Clinical Data: Shortness of breath  CHEST - 2 VIEW  Comparison:   the previous day's study  Findings: Previous CABG.  Moderate diffuse airspace opacities, relatively improved in the lung bases  since previous exam. Right lateral effusion or pleural thickening.  Heart size upper limits normal.  IMPRESSION:  1.  Partial improvement in bilateral infiltrates or edema.  Original Report Authenticated By: Thora Lance III,    Imaging: Imaging results have been reviewed  Medications:  I have reviewed the patient's current medications. Scheduled:    . aspirin EC  81 mg Oral Daily  . calcium carbonate  2 tablet Oral Daily  . carbidopa-levodopa  1 tablet Oral TID  . docusate sodium  100 mg Oral BID  . furosemide  80 mg Intravenous TID  . hydrocortisone sod succinate (SOLU-CORTEF) injection  100 mg Intravenous Q8H  . insulin aspart  0-20 Units Subcutaneous Q4H  . ipratropium  0.5 mg Nebulization QID  . lamoTRIgine  200 mg Oral Daily  . levalbuterol  0.63 mg Nebulization Q6H  . linagliptin  5 mg Oral Daily  . mycophenolate  500 mg Oral BID  . pantoprazole  40 mg Oral BID  . piperacillin-tazobactam (ZOSYN)  IV  3.375 g Intravenous Q8H  . pyridostigmine  60 mg Oral TID  . senna  1 tablet Oral BID  . sodium chloride  3 mL Intravenous Q12H  . tacrolimus  5 mg Oral BID  . Tamsulosin HCl  0.4 mg Oral Daily  . vancomycin  1,000 mg Intravenous Q12H    Assessment/Plan: Mental status improved with fewer CNS suppressing drugs and improved pulmonary care.  ABG demonstrates  chronic hypoventilation.  TSH normal.  CHF: no significant diuresis with stable renal function->increase furosemide.  Daily electrolytes and renal function.  Anemia-H&H stable.  AF-no evidence that EKG performed yesterday->reordered.  Note marked first degree AVB.  He cannot receive drugs with AV nodal blocking properties.  Chronic steroid Rx-resume usual dose when physiologic stress more normal.   LOS: 5 days   John [name] 05/21/2011, 11:22 AM

## 2011-05-21 NOTE — Progress Notes (Signed)
Subjective: Moved to ICU. CXR show mild improvement in bilat edema. Sedated  Objective Vital signs in last 24 hours: Filed Vitals:   05/21/11 0915 05/21/11 0930 05/21/11 0945 05/21/11 1000  BP: 159/64 141/67 144/70 132/58  Pulse: 86 81 85 82  Temp:      TempSrc:      Resp: 23 24 22 23   Height:      Weight:      SpO2: 99% 97% 100% 96%   Weight change:   Intake/Output Summary (Last 24 hours) at 05/21/11 1043 Last data filed at 05/21/11 0300  Gross per 24 hour  Intake      0 ml  Output    750 ml  Net   -750 ml   Labs: Basic Metabolic Panel:  Lab 05/21/11 1610 05/20/11 0630 05/19/11 1050 05/19/11 0640 05/18/11 0610  NA 138 136 135 137 137  K 4.9 4.8 4.5 4.6 4.6  CL 97 99 97 100 100  CO2 31 31 29 29 30   GLUCOSE 198* 183* 113* 92 101*  BUN 50* 37* 29* 29* 21  CREATININE 1.44* 1.36* 1.33 1.43* 1.07  ALB -- -- -- -- --  CALCIUM 10.1 9.6 9.6 9.7 9.5  PHOS 3.9 -- -- -- --   Liver Function Tests:  Lab 05/21/11 0612 05/20/11 0836  AST -- 16  ALT -- 6  ALKPHOS -- 121*  BILITOT -- 0.8  PROT -- 6.3  ALBUMIN 2.4* 2.2*   No results found for this basename: LIPASE:3,AMYLASE:3 in the last 168 hours No results found for this basename: AMMONIA:3 in the last 168 hours CBC:  Lab 05/21/11 0612 05/20/11 0630 05/18/11 0610  WBC 9.3 10.2 12.1*  NEUTROABS -- 8.0* --  HGB 9.3* 8.8* 9.3*  HCT 30.6* 28.1* 30.3*  MCV 70.7* 71.0* 71.3*  PLT 178 151 143*   PT/INR: @labrcntip (inr:5) Cardiac Enzymes: No results found for this basename: CKTOTAL:5,CKMB:5,CKMBINDEX:5,TROPONINI:5 in the last 168 hours CBG:  Lab 05/21/11 0628 05/21/11 0147 05/20/11 2208 05/20/11 1557 05/20/11 1150  GLUCAP 193* 196* 218* 213* 169*    Iron Studies: No results found for this basename: IRON:30,TIBC:30,TRANSFERRIN:30,FERRITIN:30 in the last 168 hours Studies/Results: Dg Chest 2 View  05/21/2011  *RADIOLOGY REPORT*  Clinical Data: Shortness of breath  CHEST - 2 VIEW  Comparison:   the previous day's study   Findings: Previous CABG.  Moderate diffuse airspace opacities, relatively improved in the lung bases since previous exam. Right lateral effusion or pleural thickening.  Heart size upper limits normal.  IMPRESSION:  1.  Partial improvement in bilateral infiltrates or edema.  Original Report Authenticated By: Osa Craver, M.D.   Dg Chest Port 1 View  05/21/2011  *RADIOLOGY REPORT*  Clinical Data: Central line placement.  PORTABLE CHEST - 1 VIEW  Comparison: Earlier film of the same day  Findings: Interval placement of a left IJ central line, tip in the proximal SVC.  No pneumothorax.  Previous CABG.  Some interval worsening of diffuse interstitial edema or infiltrates with central pulmonary vascular congestion.  Stable right lateral pleural thickening or effusion.  IMPRESSION:  1.  Central line placement to proximal SVC without pneumothorax. 2.  Slight worsening of bilateral infiltrates or edema.  Original Report Authenticated By: Osa Craver, M.D.   Dg Chest Port 1 View  05/20/2011  *RADIOLOGY REPORT*  Clinical Data: Pulmonary edema.  PORTABLE CHEST - 1 VIEW  Comparison: 05/19/2011.  Findings: Worsening pulmonary aeration is present with increasing basilar predominant and perihilar airspace  disease.  Cardiomegaly. Median sternotomy.  IMPRESSION: Constellation of findings compatible with worsening CHF, now moderate to severe.  Original Report Authenticated By: Andreas Newport, M.D.   Medications:    . sodium chloride Stopped (05/16/11 1515)      . aspirin EC  81 mg Oral Daily  . calcium carbonate  2 tablet Oral Daily  . carbidopa-levodopa  1 tablet Oral TID  . docusate sodium  100 mg Oral BID  . furosemide  80 mg Intravenous TID  . hydrocortisone sod succinate (SOLU-CORTEF) injection  100 mg Intravenous Q8H  . insulin aspart  0-20 Units Subcutaneous Q4H  . ipratropium  0.5 mg Nebulization QID  . lamoTRIgine  200 mg Oral Daily  . levalbuterol  0.63 mg Nebulization Q6H  .  linagliptin  5 mg Oral Daily  . mycophenolate  500 mg Oral BID  . pantoprazole  40 mg Oral BID  . piperacillin-tazobactam (ZOSYN)  IV  3.375 g Intravenous Q8H  . pyridostigmine  60 mg Oral TID  . senna  1 tablet Oral BID  . sodium chloride  3 mL Intravenous Q12H  . tacrolimus  5 mg Oral BID  . Tamsulosin HCl  0.4 mg Oral Daily  . vancomycin  1,000 mg Intravenous Q12H  . DISCONTD: Fluticasone-Salmeterol  1 puff Inhalation Q12H  . DISCONTD: furosemide  40 mg Intravenous Q12H  . DISCONTD: insulin pump   Subcutaneous TID AC, HS, 0200  . DISCONTD: tiotropium  18 mcg Inhalation Daily    I  have reviewed scheduled and prn medications.  Physical Exam:  Blood pressure 132/58, pulse 82, temperature 98.5 F (36.9 C), temperature source Axillary, resp. rate 23, height 6\' 2"  (1.88 m), weight 103.2 kg (227 lb 8.2 oz), SpO2 96.00%.  Physical Exam:  General:  Lethargic, unarouseable (sedated after procedure for central line), not in resp distress  Eyes:  PERRL, EOMI,   Neck:  JVD present   Lungs:  Decreased air movement. Basilar crackles, mild  Heart:  RRR. S1 and S2 normal with 2/6 basilar SEM.   Abdomen:  BS normoactive. Soft, Nondistended, non-tender.   Extremities:  No pretibial edema.   Neurologic:  A&O X3, CN II - XII are grossly intact. Sensations intact to light touch, .     Assessment & Plan 1. CKD / renal transplant 2002 / Cr 1.1-1.3. Home regimen includes Prograf 5mg  bid, CellCept 500mg  bid and prednisone 5 mg/d. On stress dose IV steroids now in place of prednisone . Renal function relatively stable in setting of acute CHF, creat up 1.0 > 1.4 in hospital. UOP not great, have increased IV lasix to 80 mg tid; he was taking 80am and 40pm at home.  Tacrolimus level is pending.  2. Acute diastolic congestive heart failure: Lasix was started on March 28 . BNP elevated to 2714 , chest x-ray Consistent with pulmonary edema, slightly better today. Check cardiac enzymes.  3. S/P laminectomy L5-S1  on 05/16/2011 4. Diabetes currently on sliding scale insulin, Insulin pump, Linagliptin. Per internal medicine team  5. COPD home oxygen dependent . Currently on Spiriva and Advair. Per pulmonology  6. Hyperlipidemia: Continue statin  7. Coronary artery disease status post CABG: Currently on Aspirin. Plavix on hold. Per cardiology  8. AFib: Rate controlled  9. Fever on March 28 of 100.1. Elevated WBCs. No Pneumonia on chest x-ray. Blood cultures obtained which are negative up to date.  10. Anemia likely due to chronic disease with baseline Hgb of 9 to 10. Today 8.8.  Anemia panel 03/2009 Iron 20,TIBC 140, Ferritin 407, Folate >20. Check anemia panel.    Vinson Moselle  MD [employer] [phone] pgr    [phone] cell 05/21/2011, 10:43 AM

## 2011-05-21 NOTE — Progress Notes (Signed)
Received report from previous RN that pt is alert and oriented x 2 sometimes 3, however during the assessment pt is only oriented to person, is very confused, trying to pull off o2 probe from his finger and removing his 02 mask. Pt is not able to tell me the names of his children or grandchildren. Pt reoriented, o2 reapplied; will continue to monitor closely.

## 2011-05-21 NOTE — Progress Notes (Addendum)
In the last hour pt is getting increasingly confused, pulled off condom cath, o2 mask, is yelling and screaming for help saying "you are trying to kill me" and  "let me out"; pt is very hostile and trying to hit nursing staff. Called MD on call and received an order for Ativan. Medication administered and pt is much calmer now, however pt still will not allow [place] to put condom cath back on. O2 sats in upper 90's on venturi mask 40%. Will continue to monitor.

## 2011-05-22 ENCOUNTER — Inpatient Hospital Stay (HOSPITAL_COMMUNITY): Payer: Medicare Other

## 2011-05-22 DIAGNOSIS — R4182 Altered mental status, unspecified: Secondary | ICD-10-CM

## 2011-05-22 DIAGNOSIS — J449 Chronic obstructive pulmonary disease, unspecified: Secondary | ICD-10-CM

## 2011-05-22 DIAGNOSIS — J811 Chronic pulmonary edema: Secondary | ICD-10-CM | POA: Diagnosis not present

## 2011-05-22 DIAGNOSIS — R0902 Hypoxemia: Secondary | ICD-10-CM | POA: Diagnosis present

## 2011-05-22 DIAGNOSIS — J9600 Acute respiratory failure, unspecified whether with hypoxia or hypercapnia: Secondary | ICD-10-CM | POA: Diagnosis not present

## 2011-05-22 DIAGNOSIS — N189 Chronic kidney disease, unspecified: Secondary | ICD-10-CM

## 2011-05-22 DIAGNOSIS — R0609 Other forms of dyspnea: Secondary | ICD-10-CM

## 2011-05-22 LAB — GLUCOSE, CAPILLARY
Glucose-Capillary: 106 mg/dL — ABNORMAL HIGH (ref 70–99)
Glucose-Capillary: 195 mg/dL — ABNORMAL HIGH (ref 70–99)
Glucose-Capillary: 301 mg/dL — ABNORMAL HIGH (ref 70–99)
Glucose-Capillary: 349 mg/dL — ABNORMAL HIGH (ref 70–99)

## 2011-05-22 LAB — URINE CULTURE
Colony Count: 6000
Culture  Setup Time: 201303291109

## 2011-05-22 LAB — LACTATE DEHYDROGENASE: LDH: 180 U/L (ref 94–250)

## 2011-05-22 LAB — CBC
Platelets: 174 10*3/uL (ref 150–400)
RBC: 3.96 MIL/uL — ABNORMAL LOW (ref 4.22–5.81)
WBC: 7.3 10*3/uL (ref 4.0–10.5)

## 2011-05-22 LAB — BASIC METABOLIC PANEL
CO2: 34 mEq/L — ABNORMAL HIGH (ref 19–32)
Chloride: 99 mEq/L (ref 96–112)
Sodium: 141 mEq/L (ref 135–145)

## 2011-05-22 MED ORDER — RISPERIDONE 1 MG PO TABS
1.0000 mg | ORAL_TABLET | Freq: Every day | ORAL | Status: DC
  Administered 2011-05-22 – 2011-05-23 (×2): 1 mg via ORAL
  Filled 2011-05-22 (×3): qty 1

## 2011-05-22 MED ORDER — HYDRALAZINE HCL 20 MG/ML IJ SOLN
10.0000 mg | Freq: Four times a day (QID) | INTRAMUSCULAR | Status: DC | PRN
  Administered 2011-05-22 – 2011-06-01 (×2): 10 mg via INTRAVENOUS
  Filled 2011-05-22 (×2): qty 1

## 2011-05-22 MED ORDER — KETOROLAC TROMETHAMINE 15 MG/ML IJ SOLN
15.0000 mg | Freq: Three times a day (TID) | INTRAMUSCULAR | Status: DC | PRN
  Administered 2011-05-22: 15 mg via INTRAVENOUS
  Filled 2011-05-22 (×2): qty 1

## 2011-05-22 MED ORDER — FUROSEMIDE 10 MG/ML IJ SOLN
60.0000 mg | Freq: Three times a day (TID) | INTRAMUSCULAR | Status: DC
  Administered 2011-05-22 – 2011-05-24 (×5): 60 mg via INTRAVENOUS
  Filled 2011-05-22 (×8): qty 6

## 2011-05-22 MED ORDER — INSULIN GLARGINE 100 UNIT/ML ~~LOC~~ SOLN
12.0000 [IU] | Freq: Every day | SUBCUTANEOUS | Status: DC
  Administered 2011-05-22 – 2011-05-25 (×3): 12 [IU] via SUBCUTANEOUS
  Administered 2011-05-26: 20 [IU] via SUBCUTANEOUS
  Administered 2011-05-27: 12 [IU] via SUBCUTANEOUS

## 2011-05-22 MED ORDER — HYDROCORTISONE SOD SUCCINATE 100 MG IJ SOLR
50.0000 mg | Freq: Three times a day (TID) | INTRAMUSCULAR | Status: DC
  Administered 2011-05-22 – 2011-05-23 (×2): 50 mg via INTRAVENOUS
  Filled 2011-05-22 (×5): qty 1

## 2011-05-22 MED ORDER — OXYCODONE HCL 5 MG PO TABS: 5.0000 mg | ORAL_TABLET | Freq: Two times a day (BID) | ORAL | Status: DC | PRN

## 2011-05-22 MED ORDER — HYDROCORTISONE 1 % EX CREA
TOPICAL_CREAM | Freq: Three times a day (TID) | CUTANEOUS | Status: DC | PRN
  Filled 2011-05-22: qty 28

## 2011-05-22 NOTE — Progress Notes (Signed)
eLink Physician-Brief Progress Note Patient Name: John [name] DOB: [birth year] MRN: [id number]  Date of Service  05/22/2011   HPI/Events of Note     eICU Interventions  Toradol prn pain Rounding team to decide whether narcotics can be increased   Intervention Category Intermediate Interventions: Pain - evaluation and management  Xiong Haidar V. 05/22/2011, 7:45 PM

## 2011-05-22 NOTE — Progress Notes (Signed)
CSW met with pt to provide bed offers. Pt's wife Currently, pt has a Comptroller, therefore is not ready for SNF. CSW will speak with both pt and wife regarding SNF placement. CSW will continue to follow.  Dede Query, MSW, [facility]ors [phone]

## 2011-05-22 NOTE — Consult Note (Signed)
Infectious Diseases Initial Consultation  Reason for Consultation:  Fever in immunocompromised host   HPI: John [name] is a 71 y.o. male with history of IDDM, HTN, ESRD s/p related kidney transplant 2002, CMV D/R unknown, on tacro 5 BID, MMP 500 BID, pred 5 admitted on 3/26 for elective L5-S1 laminectomy for worsening radiculopathy. His hospital course was complicated by fever of 100.4-100.7 on POD#2. He also had leukocytosis of WBC max of 12.1 on POD#1, on 3/28. He was noted to having increase work of breathing consistent with hypoxic respiratory failure concerning for CHF exacerbation vs. Possibly HCAP.  He was empirically started on vancomycin and pip/tazo in addition to an infectious work-up on 05/21/11. He remains to be afebrile since 3/30 but has had intermittent periods of delirium thought to be due to day-night reversal, and medication side effects. He denies fevers/chills/nightsweats/ or cough/ no difficulty breathing/ orthopnea.  The patient has not had any recent hospital admissions, no recent courses of antibiotics, is up to date on his immunizations. In 2011, when he had CAD, he did have prolonged hospitalization due to complications of AKI, and pulmonary edema. Overall, he has not had difficulties with his health most recently other than his back pain.  Past Medical History  Diagnosis Date  . CAD (coronary artery disease)     a. myoview 1/11: lg inf fixed defect;   b. cath 1/11: 3v CAD,  c. s/p CABG 1/11 c/b acute renal failure and AFib  . Atrial fibrillation   . Chronic diastolic heart failure     a. echo 4/11: EF 45-50%, mod LVH, LAE, inf and septal HK  . First degree AV block   . Orthostatic hypotension     pyridostigmine  . History of renal transplant   . DM2 (diabetes mellitus, type 2)   . Diabetic gastroparesis   . HTN (hypertension)   . HLD (hyperlipidemia)   . PAD (peripheral artery disease)     s/p R SFA-Pop atherectomy 11/11  . Prostate cancer   . ED (erectile  dysfunction)   . DDD (degenerative disc disease), lumbar   . Tremor   . Obesity   . Low testosterone   . Chronic kidney disease     s/p renal transplant 2002  . Pleural effusion, right     loculated  . COPD (chronic obstructive pulmonary disease)   . Anxiety   . Diabetes mellitus     insulin pump   Allergies: No Known Allergies  Current antibiotics: abtx day #2 Vancomycin 3/31- present Pip/tazo 3/31- present  MEDICATIONS:    . aspirin EC  81 mg Oral Daily  . calcium carbonate  2 tablet Oral Daily  . carbidopa-levodopa  1 tablet Oral TID  . docusate sodium  100 mg Oral BID  . furosemide  80 mg Intravenous TID  . hydrocortisone sod succinate (SOLU-CORTEF) injection  100 mg Intravenous Q8H  . insulin aspart  0-20 Units Subcutaneous Q4H  . insulin glargine  12 Units Subcutaneous Daily  . ipratropium  0.5 mg Nebulization QID  . lamoTRIgine  200 mg Oral Daily  . levalbuterol  0.63 mg Nebulization QID  . linagliptin  5 mg Oral Daily  . mycophenolate  500 mg Oral BID  . pantoprazole  40 mg Oral BID  . piperacillin-tazobactam (ZOSYN)  IV  3.375 g Intravenous Q8H  . pyridostigmine  60 mg Oral TID  . senna  1 tablet Oral BID  . sodium chloride  3 mL Intravenous Q12H  . tacrolimus  5 mg Oral BID  . Tamsulosin HCl  0.4 mg Oral Daily  . vancomycin  1,000 mg Intravenous Q12H  . DISCONTD: insulin glargine  10 Units Subcutaneous Daily  . DISCONTD: levalbuterol  0.63 mg Nebulization Q6H    History  Substance Use Topics  . Smoking status: Former Smoker -- 2.0 packs/day for 20 years    Types: Cigarettes    Quit date: 02/20/1978  . Smokeless tobacco: Never Used   Comment: smoked 1 ppd, stopped 1980  . Alcohol Use: No  - retired Clinical research associate, workers Youth worker. Hobbies include working on his [employer]. His wife provides him with his medications. One of his daughters was his kidney donor.  Family History  Problem Relation Age of Onset  . Coronary artery disease Mother   .  Valvular heart disease Father   . Hepatitis      sibling  . Coronary artery disease Sister   . Heart attack Sister   . Tuberculosis Father     Review of Systems -  Constitutional: Negative for fever, chills, diaphoresis, activity change, appetite change, fatigue and unexpected weight change.  HENT: Negative for congestion, sore throat, rhinorrhea, sneezing, trouble swallowing and sinus pressure.  Eyes: Negative for photophobia  Respiratory: Negative for cough, chest tightness, shortness of breath, wheezing and stridor.  Cardiovascular: Negative for chest pain, palpitations and leg swelling.  Gastrointestinal: Negative for nausea, vomiting, abdominal pain, diarrhea, constipation, blood in stool, abdominal distention and anal bleeding.  Genitourinary: Negative for dysuria Musculoskeletal: Negative for myalgias, back pain, joint swelling, arthralgias and gait problem.  Skin: Negative for color change, pallor, rash and wound.  Neurological: Negative for dizziness, tremors, weakness and light-headedness.  Psychiatric/Behavioral: feels that his thought process is fuzzy  OBJECTIVE: Temp:  [97.8 F (36.6 C)-98.5 F (36.9 C)] 98 F (36.7 C) (04/01 0800) Pulse Rate:  [58-87] 73  (04/01 1003) Resp:  [18-34] 23  (04/01 1003) BP: (135-176)/(36-95) 153/68 mmHg (04/01 1003) SpO2:  [85 %-100 %] 96 % (04/01 1003) FiO2 (%):  [4 %-40 %] 35 % (04/01 0830)   General Appearance:    Slightly sedated, cooperative, no distress, appears stated age  Head:    Normocephalic, without obvious abnormality, atraumatic  Eyes:    PERRL, conjunctiva/corneas clear, EOM's intact,     Nose:   Nares normal, septum midline, mucosa normal, no drainage   or sinus tenderness  Throat:   Dry oral mucosa, Lips, and tongue ; uses upper denture.   Neck:   Supple, symmetrical, trachea midline, no adenopathy;           Lungs:     Clear to auscultation bilaterally, respirations unlabored  Chest wall:    No tenderness or  deformity  Heart:    Regular rate and rhythm, S1 and S2 normal, no murmur, rub   or gallop  Abdomen:     Soft, non-tender, bowel sounds active all four quadrants,    Surgical incisional scar in RLQ.         Extremities:   Extremities normal, atraumatic, no cyanosis or edema  Pulses:   2+ and symmetric all extremities  Skin:   fril appearing skin consistent with steroid use. Several ecchymosis to right arm.  Lymph nodes:   Cervical, supraclavicular, and axillary nodes normal  Neurologic:   CNII-XII intact. Normal strength, sensation    LABS: Results for orders placed during the hospital encounter of 05/16/11 (from the past 48 hour(s))  GLUCOSE, CAPILLARY     Status: Abnormal  Collection Time   05/20/11 11:50 AM      Component Value Range Comment   Glucose-Capillary 169 (*) 70 - 99 (mg/dL)   GLUCOSE, CAPILLARY     Status: Abnormal   Collection Time   05/20/11  3:57 PM      Component Value Range Comment   Glucose-Capillary 213 (*) 70 - 99 (mg/dL)   URINALYSIS, WITH MICROSCOPIC     Status: Abnormal   Collection Time   05/20/11  5:58 PM      Component Value Range Comment   Color, Urine YELLOW  YELLOW     APPearance CLOUDY (*) CLEAR     Specific Gravity, Urine 1.015  1.005 - 1.030     pH 5.5  5.0 - 8.0     Glucose, UA NEGATIVE  NEGATIVE (mg/dL)    Hgb urine dipstick TRACE (*) NEGATIVE     Bilirubin Urine NEGATIVE  NEGATIVE     Ketones, ur NEGATIVE  NEGATIVE (mg/dL)    Protein, ur NEGATIVE  NEGATIVE (mg/dL)    Urobilinogen, UA 0.2  0.0 - 1.0 (mg/dL)    Nitrite NEGATIVE  NEGATIVE     Leukocytes, UA NEGATIVE  NEGATIVE     WBC, UA 0-2  <3 (WBC/hpf)    RBC / HPF 0-2  <3 (RBC/hpf)    Bacteria, UA MANY (*) RARE     Squamous Epithelial / LPF FEW (*) RARE    SODIUM, URINE, RANDOM     Status: Normal   Collection Time   05/20/11  5:58 PM      Component Value Range Comment   Sodium, Ur 20     GLUCOSE, CAPILLARY     Status: Abnormal   Collection Time   05/20/11 10:08 PM      Component  Value Range Comment   Glucose-Capillary 218 (*) 70 - 99 (mg/dL)    Comment 1 Documented in Chart      Comment 2 Notify RN     GLUCOSE, CAPILLARY     Status: Abnormal   Collection Time   05/21/11  1:47 AM      Component Value Range Comment   Glucose-Capillary 196 (*) 70 - 99 (mg/dL)    Comment 1 Documented in Chart      Comment 2 Notify RN     CBC     Status: Abnormal   Collection Time   05/21/11  6:12 AM      Component Value Range Comment   WBC 9.3  4.0 - 10.5 (K/uL)    RBC 4.33  4.22 - 5.81 (MIL/uL)    Hemoglobin 9.3 (*) 13.0 - 17.0 (g/dL)    HCT 16.1 (*) 09.6 - 52.0 (%)    MCV 70.7 (*) 78.0 - 100.0 (fL)    MCH 21.5 (*) 26.0 - 34.0 (pg)    MCHC 30.4  30.0 - 36.0 (g/dL)    RDW 04.5 (*) 40.9 - 15.5 (%)    Platelets 178  150 - 400 (K/uL)   IRON AND TIBC     Status: Abnormal   Collection Time   05/21/11  6:12 AM      Component Value Range Comment   Iron 12 (*) 42 - 135 (ug/dL)    TIBC 811 (*) 914 - 435 (ug/dL)    Saturation Ratios 10 (*) 20 - 55 (%)    UIBC 113 (*) 125 - 400 (ug/dL)   RENAL FUNCTION PANEL     Status: Abnormal   Collection Time   05/21/11  6:12 AM      Component Value Range Comment   Sodium 138  135 - 145 (mEq/L)    Potassium 4.9  3.5 - 5.1 (mEq/L)    Chloride 97  96 - 112 (mEq/L)    CO2 31  19 - 32 (mEq/L)    Glucose, Bld 198 (*) 70 - 99 (mg/dL)    BUN 50 (*) 6 - 23 (mg/dL)    Creatinine, Ser 1.61 (*) 0.50 - 1.35 (mg/dL)    Calcium 09.6  8.4 - 10.5 (mg/dL)    Phosphorus 3.9  2.3 - 4.6 (mg/dL)    Albumin 2.4 (*) 3.5 - 5.2 (g/dL)    GFR calc non Af Amer 47 (*) >90 (mL/min)    GFR calc Af Amer 55 (*) >90 (mL/min)   GLUCOSE, CAPILLARY     Status: Abnormal   Collection Time   05/21/11  6:28 AM      Component Value Range Comment   Glucose-Capillary 193 (*) 70 - 99 (mg/dL)   MRSA PCR SCREENING     Status: Normal   Collection Time   05/21/11  9:50 AM      Component Value Range Comment   MRSA by PCR NEGATIVE  NEGATIVE    POCT I-STAT 3, BLOOD GAS (G3+)      Status: Abnormal   Collection Time   05/21/11 10:29 AM      Component Value Range Comment   pH, Arterial 7.409  7.350 - 7.450     pCO2 arterial 51.6 (*) 35.0 - 45.0 (mmHg)    pO2, Arterial 95.0  80.0 - 100.0 (mmHg)    Bicarbonate 32.7 (*) 20.0 - 24.0 (mEq/L)    TCO2 34  0 - 100 (mmol/L)    O2 Saturation 97.0      Acid-Base Excess 7.0 (*) 0.0 - 2.0 (mmol/L)    Patient temperature 98.4 F      Collection site RADIAL, ALLEN'S TEST ACCEPTABLE      Drawn by Operator      Sample type ARTERIAL     PROCALCITONIN     Status: Normal   Collection Time   05/21/11 11:26 AM      Component Value Range Comment   Procalcitonin 0.23     CARDIAC PANEL(CRET KIN+CKTOT+MB+TROPI)     Status: Normal   Collection Time   05/21/11 11:26 AM      Component Value Range Comment   Total CK 36  7 - 232 (U/L)    CK, MB 1.8  0.3 - 4.0 (ng/mL)    Troponin I <0.30  <0.30 (ng/mL)    Relative Index RELATIVE INDEX IS INVALID  0.0 - 2.5    SODIUM, URINE, RANDOM     Status: Normal   Collection Time   05/21/11 11:38 AM      Component Value Range Comment   Sodium, Ur 29     GLUCOSE, CAPILLARY     Status: Abnormal   Collection Time   05/21/11 12:27 PM      Component Value Range Comment   Glucose-Capillary 237 (*) 70 - 99 (mg/dL)   GLUCOSE, CAPILLARY     Status: Abnormal   Collection Time   05/21/11  4:03 PM      Component Value Range Comment   Glucose-Capillary 250 (*) 70 - 99 (mg/dL)   GLUCOSE, CAPILLARY     Status: Abnormal   Collection Time   05/21/11  9:08 PM      Component Value Range Comment  Glucose-Capillary 281 (*) 70 - 99 (mg/dL)   GLUCOSE, CAPILLARY     Status: Abnormal   Collection Time   05/21/11 11:36 PM      Component Value Range Comment   Glucose-Capillary 279 (*) 70 - 99 (mg/dL)   CBC     Status: Abnormal   Collection Time   05/22/11  4:00 AM      Component Value Range Comment   WBC 7.3  4.0 - 10.5 (K/uL)    RBC 3.96 (*) 4.22 - 5.81 (MIL/uL)    Hemoglobin 8.6 (*) 13.0 - 17.0 (g/dL)    HCT 96.2  (*) 95.2 - 52.0 (%)    MCV 69.9 (*) 78.0 - 100.0 (fL)    MCH 21.7 (*) 26.0 - 34.0 (pg)    MCHC 31.0  30.0 - 36.0 (g/dL)    RDW 84.1 (*) 32.4 - 15.5 (%)    Platelets 174  150 - 400 (K/uL)   BASIC METABOLIC PANEL     Status: Abnormal   Collection Time   05/22/11  4:00 AM      Component Value Range Comment   Sodium 141  135 - 145 (mEq/L)    Potassium 3.9  3.5 - 5.1 (mEq/L)    Chloride 99  96 - 112 (mEq/L)    CO2 34 (*) 19 - 32 (mEq/L)    Glucose, Bld 222 (*) 70 - 99 (mg/dL)    BUN 63 (*) 6 - 23 (mg/dL)    Creatinine, Ser 4.01 (*) 0.50 - 1.35 (mg/dL)    Calcium 02.7  8.4 - 10.5 (mg/dL)    GFR calc non Af Amer 47 (*) >90 (mL/min)    GFR calc Af Amer 54 (*) >90 (mL/min)   GLUCOSE, CAPILLARY     Status: Abnormal   Collection Time   05/22/11  4:03 AM      Component Value Range Comment   Glucose-Capillary 217 (*) 70 - 99 (mg/dL)   GLUCOSE, CAPILLARY     Status: Abnormal   Collection Time   05/22/11  7:37 AM      Component Value Range Comment   Glucose-Capillary 195 (*) 70 - 99 (mg/dL)     MICRO: 2/53 blood cultures x 2 NGTD 3/28 urine culture= 6,000 colonies of Morganella morganii *ctx <1, cipro <0.25, gent <1, piptazo<4, bactrim <20 3/29 sputum culture PENDING  IMAGING: Dg Chest 2 View  05/21/2011  *RADIOLOGY REPORT*  Clinical Data: Shortness of breath  CHEST - 2 VIEW  Comparison:   the previous day's study  Findings: Previous CABG.  Moderate diffuse airspace opacities, relatively improved in the lung bases since previous exam. Right lateral effusion or pleural thickening.  Heart size upper limits normal.  IMPRESSION:  1.  Partial improvement in bilateral infiltrates or edema.  Original Report Authenticated By: Osa Craver, M.D.   Dg Chest Port 1 View  05/22/2011  *RADIOLOGY REPORT*  Clinical Data: Respiratory failure  PORTABLE CHEST - 1 VIEW  Comparison: 05/21/2011  Findings: Cardiomediastinal silhouette is stable.  Stable left IJ central line position.  No significant change.   Bilateral edema or infiltrates again noted.  Probable small right pleural effusion with right basilar atelectasis or infiltrate. Status post CABG.  IMPRESSION: No significant change.  Bilateral edema or infiltrates again noted. Probable small right pleural effusion with right basilar atelectasis or infiltrate.  Original Report Authenticated By: Natasha Mead, M.D.   Dg Chest Port 1 View  05/21/2011  *RADIOLOGY REPORT*  Clinical Data: Central line  placement.  PORTABLE CHEST - 1 VIEW  Comparison: Earlier film of the same day  Findings: Interval placement of a left IJ central line, tip in the proximal SVC.  No pneumothorax.  Previous CABG.  Some interval worsening of diffuse interstitial edema or infiltrates with central pulmonary vascular congestion.  Stable right lateral pleural thickening or effusion.  IMPRESSION:  1.  Central line placement to proximal SVC without pneumothorax. 2.  Slight worsening of bilateral infiltrates or edema.  Original Report Authenticated By: Osa Craver, M.D.   Assessment/Plan:  71yo M with DM, HTN, CAD, s/p renal txp in 2002, on tacro 5 bid, cellcelpt 500 bid, hydrocortisone 100 TID presented for elective laminectomy c/b leukocytosis of 12 on POD #1, fevers on POD #2, and now respiratory distress secondary to CHF, mild AKI +/- underlying infection. He was empirically started on vanco and piptazo.  -- clinical picture appears to be more consistent of CHF rather than HCAP. Recommend to continue his current antibiotics until sputum cultures return to decide whether he needs further treatment. What points away from infection is that his WBC continue to trend down after his surgery suggestive of having leukocytosis due to stress of surgery. However, his one day of fevers are not necessarily explained his surgery.   -- given that he is an immunocompromised host, continue with vanco/piptazo until culture results return.  -- check vanco trough at next am dose  -- morganella  isolated in urine culture is below the threshold for UTI, and likely represents colonization.  -- if he has recurrent fever of 100.3 or greater, please repeat pan cultures of blood, urine, ua, and sputum cx.  -- check tacro level in setting of worsening AKI to see if immunosuppression needs adjustment.  Duke Salvia Drue Second MD MPH [facility] [phone]

## 2011-05-22 NOTE — Progress Notes (Signed)
Patient ID: John [name], male   DOB: Aug 18, 1940, 71 y.o.   MRN: [id number] Respiratory decompensation noted. Appreciate medical management for this process. Complains of low back and bilateral lower extremity pain still. Agree with use of Toradol if appropriate in patient's clinical setting.

## 2011-05-22 NOTE — Progress Notes (Signed)
John [name] Progress Note   Assessment & Plan  1. CKD / renal transplant 2002 / Cr 1.1-1.3. Home regimen includes Prograf 5mg  bid, CellCept 500mg  bid and prednisone 5 mg/d. On stress dose IV steroids will decrease.  Renal function relatively stable in setting of acute CHF, creat up 1.0 > 1.46 in hospital. UOP improved, have decreased IV lasix to 60 mg tid; he was taking 80am and 40pm at home. Tacrolimus level is pending.  2. Acute diastolic congestive heart failure: Lasix was started on March 28 . BNP elevated to 2714 , chest x-ray Consistent with pulmonary edema, and pl effusions. Cardiac enzymes negative. Subjective:   Some confusion, c/o puritis   Objective:   BP 146/62  Pulse 82  Temp(Src) 97.6 F (36.4 C) (Oral)  Resp 17  Ht 6\' 2"  (1.88 m)  Wt 103.2 kg (227 lb 8.2 oz)  BMI 29.21 kg/m2  SpO2 98%  Intake/Output Summary (Last 24 hours) at 05/22/11 1715 Last data filed at 05/22/11 1618  Gross per 24 hour  Intake   1326 ml  Output   4050 ml  Net  -2724 ml   Weight change:   Physical Exam: WUJ:[id number] CVS:RRR Resp:decreased BS GNF:AOZH Ext:tr-1+edema  Imaging: Dg Chest 2 View  05/21/2011  *RADIOLOGY REPORT*  Clinical Data: Shortness of breath  CHEST - 2 VIEW  Comparison:   the previous day's study  Findings: Previous CABG.  Moderate diffuse airspace opacities, relatively improved in the lung bases since previous exam. Right lateral effusion or pleural thickening.  Heart size upper limits normal.  IMPRESSION:  1.  Partial improvement in bilateral infiltrates or edema.  Original Report Authenticated By: Osa Craver, M.D.   Dg Chest Port 1 View  05/22/2011  *RADIOLOGY REPORT*  Clinical Data: Respiratory failure  PORTABLE CHEST - 1 VIEW  Comparison: 05/21/2011  Findings: Cardiomediastinal silhouette is stable.  Stable left IJ central line position.  No significant change.  Bilateral edema or infiltrates again noted.  Probable small right pleural effusion  with right basilar atelectasis or infiltrate. Status post CABG.  IMPRESSION: No significant change.  Bilateral edema or infiltrates again noted. Probable small right pleural effusion with right basilar atelectasis or infiltrate.  Original Report Authenticated By: Natasha Mead, M.D.   Dg Chest Port 1 View  05/21/2011  *RADIOLOGY REPORT*  Clinical Data: Central line placement.  PORTABLE CHEST - 1 VIEW  Comparison: Earlier film of the same day  Findings: Interval placement of a left IJ central line, tip in the proximal SVC.  No pneumothorax.  Previous CABG.  Some interval worsening of diffuse interstitial edema or infiltrates with central pulmonary vascular congestion.  Stable right lateral pleural thickening or effusion.  IMPRESSION:  1.  Central line placement to proximal SVC without pneumothorax. 2.  Slight worsening of bilateral infiltrates or edema.  Original Report Authenticated By: Thora Lance III, M.D.    Labs: BMET  Lab 05/22/11 0400 05/21/11 0612 05/20/11 0630 05/19/11 1050 05/19/11 0640 05/18/11 0610  NA 141 138 136 135 137 137  K 3.9 4.9 4.8 4.5 4.6 4.6  CL 99 97 99 97 100 100  CO2 34* 31 31 29 29 30   GLUCOSE 222* 198* 183* 113* 92 101*  BUN 63* 50* 37* 29* 29* 21  CREATININE 1.46* 1.44* 1.36* 1.33 1.43* 1.07  ALB -- -- -- -- -- --  CALCIUM 10.1 10.1 9.6 9.6 9.7 9.5  PHOS -- 3.9 -- -- -- --   CBC  Lab  05/22/11 0400 05/21/11 0612 05/20/11 0630 05/18/11 0610  WBC 7.3 9.3 10.2 12.1*  NEUTROABS -- -- 8.0* --  HGB 8.6* 9.3* 8.8* 9.3*  HCT 27.7* 30.6* 28.1* 30.3*  MCV 69.9* 70.7* 71.0* 71.3*  PLT 174 178 151 143*    Medications:      . aspirin EC  81 mg Oral Daily  . calcium carbonate  2 tablet Oral Daily  . carbidopa-levodopa  1 tablet Oral TID  . docusate sodium  100 mg Oral BID  . furosemide  80 mg Intravenous TID  . hydrocortisone sod succinate (SOLU-CORTEF) injection  100 mg Intravenous Q8H  . insulin aspart  0-20 Units Subcutaneous Q4H  . insulin glargine  12  Units Subcutaneous Daily  . ipratropium  0.5 mg Nebulization QID  . lamoTRIgine  200 mg Oral Daily  . levalbuterol  0.63 mg Nebulization QID  . linagliptin  5 mg Oral Daily  . mycophenolate  500 mg Oral BID  . pantoprazole  40 mg Oral BID  . piperacillin-tazobactam (ZOSYN)  IV  3.375 g Intravenous Q8H  . pyridostigmine  60 mg Oral TID  . senna  1 tablet Oral BID  . sodium chloride  3 mL Intravenous Q12H  . tacrolimus  5 mg Oral BID  . Tamsulosin HCl  0.4 mg Oral Daily  . vancomycin  1,000 mg Intravenous Q12H  . DISCONTD: insulin glargine  10 Units Subcutaneous Daily

## 2011-05-22 NOTE — Progress Notes (Signed)
    Subjective:  No chest pain or dyspnea. States 'feel great.'  Objective:  Vital Signs in the last 24 hours: Temp:  [97.8 F (36.6 C)-98.5 F (36.9 C)] 97.8 F (36.6 C) (04/01 0356) Pulse Rate:  [58-87] 75  (04/01 0600) Resp:  [20-34] 25  (04/01 0600) BP: (132-176)/(36-91) 161/69 mmHg (04/01 0600) SpO2:  [85 %-100 %] 96 % (04/01 0600) FiO2 (%):  [4 %-40 %] 4 % (03/31 1900)  Intake/Output from previous day: 03/31 0701 - 04/01 0700 In: 1010 [P.O.:360; I.V.:60; IV Piggyback:590] Out: 3000 [Urine:3000]  Physical Exam: Pt is alert to person, not to place. HEENT: normal Neck: JVP - elevated to angle of mandible Lungs: CTA bilaterally CV: RRR without murmur or gallop Abd: soft, NT, Positive BS, obese Ext: 1+ ankle edema, distal pulses intact and equal Skin: warm/dry no rash   Lab Results:  Basename 05/22/11 0400 05/21/11 0612  WBC 7.3 9.3  HGB 8.6* 9.3*  PLT 174 178    Basename 05/22/11 0400 05/21/11 0612  NA 141 138  K 3.9 4.9  CL 99 97  CO2 34* 31  GLUCOSE 222* 198*  BUN 63* 50*  CREATININE 1.46* 1.44*    Basename 05/21/11 1126  TROPONINI <0.30  Tele: sinus rhythm with marked first degree AVB  Assessment/Plan: 1. Post-op delirium 2. Acute on chronic diastolic heart failure 3. S/p renal transplant 4. CAD without active angina 5. COPD - home O2  Remains volume overloaded, continue IV lasix at current dose today. Otherwise stable from CV perspective - no med changes. Will follow with you.  Tonny Bollman, M.D. 05/22/2011, 7:35 AM

## 2011-05-22 NOTE — Progress Notes (Signed)
Utilization review completed. Tilmon Wisehart, RN, BSN. 05/22/11  

## 2011-05-22 NOTE — Progress Notes (Signed)
John [name], [name] [phone] 05/22/2011

## 2011-05-22 NOTE — Progress Notes (Signed)
PT/OT Cancellation Note  Treatment cancelled today due to pt desating this am per RN.  Spoke with RN who notes Respiratory decline today and requests hold PT/OT today.  Will try another time.    Sunny Schlein, [place] [phone] 05/22/2011, 10:20 AM

## 2011-05-22 NOTE — Progress Notes (Signed)
Inpatient Diabetes Program Recommendations  AACE/ADA: New Consensus Statement on Inpatient Glycemic Control (2009)  Target Ranges:  Prepandial:   less than 140 mg/dL      Peak postprandial:   less than 180 mg/dL (1-2 hours)      Critically ill patients:  140 - 180 mg/dL   Reason for Visit: Hyperglycemia Results for NITESH, PITSTICK (MRN [id number]) as of 05/22/2011 14:49  Ref. Range 05/21/2011 01:47 05/21/2011 06:28 05/21/2011 12:27 05/21/2011 16:03 05/21/2011 21:08 05/21/2011 23:36 05/22/2011 04:03 05/22/2011 07:37 05/22/2011 12:09  Glucose-Capillary Latest Range: 70-99 mg/dL 846 (H) 962 (H) 952 (H) 250 (H) 281 (H) 279 (H) 217 (H) 195 (H) 301 (H)     Inpatient Diabetes Program Recommendations Insulin - Basal: Titrate Lantus until FBS < 140 mg/dL   (Pt on 48 units basal insulin via insulin pump at home. Insulin - Meal Coverage: May benefit from additional Novolog 2 - 3 units Q 4 hours while on Solumedrol  Note: Will continue to follow daily.

## 2011-05-22 NOTE — Progress Notes (Signed)
HPI:  71 yo male with hx COPD, renal transplant (2002), DM on insulin pump who presented 3/26 for lumbar laminotomies and decompression L5 and S1 r/t spondylosis and herniated nucleus pulposus. Post op developed episodes of confusion and respiratory distress thought r/t CHF Which initially improved with lasix. On 3/31 developed worsening respiratory distress and hypoxia and PCCM consulted.   Antibiotics:   Vanc 3/31>>>  Zosyn 3/31>>>   Cultures/Sepsis Markers:   Urine 3/29>>> MORGANELLA MORGANII (6,000 COLONIES/ML) BCx2 3/28>>> negative Sputum 3/31>>>  Pct 3/31>>>   Access/Protocols:  L IJ CVL>> 3/31  Best Practice: DVT:  SCD GI: Pantoprazole, Colace  Subjective:   Physical Exam: Filed Vitals:   05/22/11 0800  BP: 162/95  Pulse: 80  Temp: 98 F (36.7 C)  Resp: 23   General: chronically ill appearing male, AOx1 Neuro: awake, confused, MAE, gen weakness  CV: irregularly irregular, no m/r/g  PULM: resps even, mildly labored, diminished throughout with few scattered ronchi  GI: abd soft, +bs  Extremities: Warm and dry, trace BLE edema    Intake/Output Summary (Last 24 hours) at 05/22/11 0906 Last data filed at 05/22/11 0848  Gross per 24 hour  Intake   1058 ml  Output   3175 ml  Net  -2117 ml   Vent Mode:  [-]  FiO2 (%):  [4 %-40 %] 4 %   Labs: CBC:    Component Value Date/Time   WBC 7.3 05/22/2011 0400   HGB 8.6* 05/22/2011 0400   HCT 27.7* 05/22/2011 0400   PLT 174 05/22/2011 0400   MCV 69.9* 05/22/2011 0400   NEUTROABS 8.0* 05/20/2011 0630   LYMPHSABS 1.1 05/20/2011 0630   MONOABS 0.9 05/20/2011 0630   EOSABS 0.1 05/20/2011 0630   BASOSABS 0.0 05/20/2011 0630     Basic Metabolic Panel:    Component Value Date/Time   NA 141 05/22/2011 0400   K 3.9 05/22/2011 0400   CL 99 05/22/2011 0400   CO2 34* 05/22/2011 0400   BUN 63* 05/22/2011 0400   CREATININE 1.46* 05/22/2011 0400   GLUCOSE 222* 05/22/2011 0400   CALCIUM 10.1 05/22/2011 0400   Comprehensive Metabolic Panel:      Component Value Date/Time   NA 141 05/22/2011 0400   K 3.9 05/22/2011 0400   CL 99 05/22/2011 0400   CO2 34* 05/22/2011 0400   BUN 63* 05/22/2011 0400   CREATININE 1.46* 05/22/2011 0400   GLUCOSE 222* 05/22/2011 0400   CALCIUM 10.1 05/22/2011 0400   AST 16 05/20/2011 0836   ALT 6 05/20/2011 0836   ALKPHOS 121* 05/20/2011 0836   BILITOT 0.8 05/20/2011 0836   PROT 6.3 05/20/2011 0836   ALBUMIN 2.4* 05/21/2011 0612    ABG    Component Value Date/Time   PHART 7.409 05/21/2011 1029   PCO2ART 51.6* 05/21/2011 1029   PO2ART 95.0 05/21/2011 1029   HCO3 32.7* 05/21/2011 1029   TCO2 34 05/21/2011 1029   ACIDBASEDEF 1.0 04/22/2009 0905   O2SAT 97.0 05/21/2011 1029    No results found for this basename: MG in the last 168 hours Lab Results  Component Value Date   CALCIUM 10.1 05/22/2011   PHOS 3.9 05/21/2011    Chest Xray: 05/22/11: No significant change. Bilateral edema or infiltrates again noted. Probable small right pleural effusion with right basilar atelectasis or infiltrate.  Assessment & Plan:  Acute respiratory failure -- likely multifactorial in setting volume overload with underlying COPD +/- HCAP post lumbar surgery in immunosuppressed patient. Patient is afebrile without  WBC elevation. CXR shows diffuse edema without clear focal infiltrate.  PLAN -  Cont diuresis with IV lasix Empiric abx for ?HCAP with vanc and zosyn Check LDH, urine legionella, urine strep ID consult for further input  DM - home on insulin pump.  PLAN -  Will d/c insulin pump while critically ill  Increase lantus to 12 units   HTN -  PLAN -  Cont diuresis  Add PRN hydralazine, hold SBP < 140.   Chronic renal insufficiency s/p renal transplant 2002 - renal following.  PLAN -  Cont cellcept, prograf  Diuresis per renal   Best practices / Disposition:  -->ICU status under PCCM  -->full code  -->SCD's for DVT Px  -->Protonix and Colace for GI Px   TOBBIA,PATRICK, MD  Hypoxic respiratory failure secondary to COPD,  CHF and ? Of infiltrate.  Too unstable to CT his chest or bronch for now.  Will consult ID and continue diureses.  Extensive discussion with the wife and one daughter.  The patient would not want to be intubated and on support indefinitely but short term they wanted to speak with the rest of the family about it.  Will revisit later today or in AM.  CC time 35 minutes.  Patient seen and examined, agree with above note.  I dictated the care and orders written for this patient under my direction.  Koren Bound, M.D. [phone]

## 2011-05-23 ENCOUNTER — Other Ambulatory Visit: Payer: Self-pay

## 2011-05-23 LAB — RENAL FUNCTION PANEL
Albumin: 2.5 g/dL — ABNORMAL LOW (ref 3.5–5.2)
BUN: 65 mg/dL — ABNORMAL HIGH (ref 6–23)
Calcium: 10 mg/dL (ref 8.4–10.5)
Chloride: 95 mEq/L — ABNORMAL LOW (ref 96–112)
Creatinine, Ser: 1.54 mg/dL — ABNORMAL HIGH (ref 0.50–1.35)

## 2011-05-23 LAB — GLUCOSE, CAPILLARY
Glucose-Capillary: 212 mg/dL — ABNORMAL HIGH (ref 70–99)
Glucose-Capillary: 181 mg/dL — ABNORMAL HIGH (ref 70–99)
Glucose-Capillary: 231 mg/dL — ABNORMAL HIGH (ref 70–99)
Glucose-Capillary: 214 mg/dL — ABNORMAL HIGH (ref 70–99)

## 2011-05-23 LAB — TACROLIMUS LEVEL: Tacrolimus (FK506) - LabCorp: 12.3 ng/mL

## 2011-05-23 LAB — VANCOMYCIN, TROUGH: Vancomycin Tr: 27.9 ug/mL (ref 10.0–20.0)

## 2011-05-23 LAB — LEGIONELLA ANTIGEN, URINE

## 2011-05-23 MED ORDER — HYDROCORTISONE SOD SUCCINATE 100 MG IJ SOLR
20.0000 mg | Freq: Every day | INTRAMUSCULAR | Status: DC
  Administered 2011-05-24 – 2011-05-29 (×6): 20 mg via INTRAVENOUS
  Administered 2011-05-30: 11:00:00 via INTRAVENOUS
  Filled 2011-05-23 (×8): qty 0.4

## 2011-05-23 MED ORDER — POTASSIUM CHLORIDE CRYS ER 20 MEQ PO TBCR
40.0000 meq | EXTENDED_RELEASE_TABLET | Freq: Once | ORAL | Status: AC
  Administered 2011-05-23: 40 meq via ORAL
  Filled 2011-05-23: qty 2

## 2011-05-23 MED ORDER — HALOPERIDOL LACTATE 5 MG/ML IJ SOLN
5.0000 mg | Freq: Once | INTRAMUSCULAR | Status: AC
  Administered 2011-05-23: 5 mg via INTRAMUSCULAR

## 2011-05-23 MED ORDER — QUETIAPINE FUMARATE 25 MG PO TABS
25.0000 mg | ORAL_TABLET | Freq: Once | ORAL | Status: AC
  Administered 2011-05-23: 25 mg via ORAL
  Filled 2011-05-23: qty 1

## 2011-05-23 NOTE — Progress Notes (Signed)
ANTIBIOTIC CONSULT NOTE - Follow-up  Pharmacy Consult for vancomycin + zosyn Indication: rule out pneumonia  No Known Allergies  Patient Measurements: Height: 6\' 2"  (188 cm) Weight: 216 lb 11.4 oz (98.3 kg) (bed weight) IBW/kg (Calculated) : 82.2   Vital Signs: Temp: 97.9 F (36.6 C) (04/02 0400) Temp src: Axillary (04/02 0400) BP: 158/76 mmHg (04/02 0541) Pulse Rate: 72  (04/02 0700) Intake/Output from previous day: 04/01 0701 - 04/02 0700 In: 1292 [P.O.:360; I.V.:360; IV Piggyback:572] Out: 2225 [Urine:2225] Intake/Output from this shift:    Labs:  Basename 05/23/11 0537 05/22/11 0400 05/21/11 0612  WBC -- 7.3 9.3  HGB -- 8.6* 9.3*  PLT -- 174 178  LABCREA -- -- --  CREATININE 1.54* 1.46* 1.44*   Estimated Creatinine Clearance: 51.2 ml/min (by C-G formula based on Cr of 1.54).  Basename 05/23/11 1000  VANCOTROUGH 27.9*  VANCOPEAK --  Drue Dun --  GENTTROUGH --  GENTPEAK --  GENTRANDOM --  TOBRATROUGH --  TOBRAPEAK --  TOBRARND --  AMIKACINPEAK --  AMIKACINTROU --  AMIKACIN --     Microbiology: Recent Results (from the past 720 hour(s))  SURGICAL PCR SCREEN     Status: Normal   Collection Time   05/10/11  1:34 PM      Component Value Range Status Comment   MRSA, PCR NEGATIVE  NEGATIVE  Final    Staphylococcus aureus NEGATIVE  NEGATIVE  Final   CULTURE, BLOOD (ROUTINE X 2)     Status: Normal (Preliminary result)   Collection Time   05/18/11  6:45 PM      Component Value Range Status Comment   Specimen Description BLOOD LEFT ARM   Final    Special Requests BOTTLES DRAWN AEROBIC ONLY 10CC   Final    Culture  Setup Time [id number]   Final    Culture     Final    Value:        BLOOD CULTURE RECEIVED NO GROWTH TO DATE CULTURE WILL BE HELD FOR 5 DAYS BEFORE ISSUING A FINAL NEGATIVE REPORT   Report Status PENDING   Incomplete   CULTURE, BLOOD (ROUTINE X 2)     Status: Normal (Preliminary result)   Collection Time   05/18/11  6:50 PM      Component  Value Range Status Comment   Specimen Description BLOOD RIGHT HAND   Final    Special Requests BOTTLES DRAWN AEROBIC ONLY 10CC   Final    Culture  Setup Time [id number]   Final    Culture     Final    Value:        BLOOD CULTURE RECEIVED NO GROWTH TO DATE CULTURE WILL BE HELD FOR 5 DAYS BEFORE ISSUING A FINAL NEGATIVE REPORT   Report Status PENDING   Incomplete   URINE CULTURE     Status: Normal   Collection Time   05/19/11 10:33 AM      Component Value Range Status Comment   Specimen Description URINE, CATHETERIZED   Final    Special Requests CONDOM   Final    Culture  Setup Time [id number]   Final    Colony Count 6,000 COLONIES/ML   Final    Culture [facility] MORGANII   Final    Report Status 05/22/2011 FINAL   Final    Organism ID, Bacteria MORGANELLA MORGANII   Final   CULTURE, SPUTUM-ASSESSMENT     Status: Normal   Collection Time   05/19/11 11:16 AM  Component Value Range Status Comment   Specimen Description SPUTUM   Final    Special Requests NONE   Final    Sputum evaluation     Final    Value: MICROSCOPIC FINDINGS SUGGEST THAT THIS SPECIMEN IS NOT REPRESENTATIVE OF LOWER RESPIRATORY SECRETIONS. PLEASE RECOLLECT.     Gram Stain Report Called to,Read Back By and Verified With: Wu RN 11:55 05/19/11 ([email or web address])   Report Status 05/19/2011 FINAL   Final   CULTURE, SPUTUM-ASSESSMENT     Status: Normal   Collection Time   05/19/11  5:52 PM      Component Value Range Status Comment   Specimen Description SPUTUM   Final    Special Requests Immunocompromised   Final    Sputum evaluation     Final    Value: MICROSCOPIC FINDINGS SUGGEST THAT THIS SPECIMEN IS NOT REPRESENTATIVE OF LOWER RESPIRATORY SECRETIONS. PLEASE RECOLLECT.     CALLED TO RN D.JOHNSON AT 2016 BY L.PITT 0/29/13   Report Status 05/19/2011 FINAL   Final   MRSA PCR SCREENING     Status: Normal   Collection Time   05/21/11  9:50 AM      Component Value Range Status Comment   MRSA by PCR NEGATIVE  NEGATIVE  Final      Medical History: Past Medical History  Diagnosis Date  . CAD (coronary artery disease)     a. myoview 1/11: lg inf fixed defect;   b. cath 1/11: 3v CAD,  c. s/p CABG 1/11 c/b acute renal failure and AFib  . Atrial fibrillation   . Chronic diastolic heart failure     a. echo 4/11: EF 45-50%, mod LVH, LAE, inf and septal HK  . First degree AV block   . Orthostatic hypotension     pyridostigmine  . History of renal transplant   . DM2 (diabetes mellitus, type 2)   . Diabetic gastroparesis   . HTN (hypertension)   . HLD (hyperlipidemia)   . PAD (peripheral artery disease)     s/p R SFA-Pop atherectomy 11/11  . Prostate cancer   . ED (erectile dysfunction)   . DDD (degenerative disc disease), lumbar   . Tremor   . Obesity   . Low testosterone   . Chronic kidney disease     s/p renal transplant 2002  . Pleural effusion, right     loculated  . COPD (chronic obstructive pulmonary disease)   . Anxiety   . Diabetes mellitus     insulin pump    Medications:  Anti-infectives     Start     Dose/Rate Route Frequency Ordered Stop   05/21/11 1000   piperacillin-tazobactam (ZOSYN) IVPB 3.375 g        3.375 g 12.5 mL/hr over 240 Minutes Intravenous Every 8 hours 05/21/11 0855     05/21/11 1000   vancomycin (VANCOCIN) IVPB 1000 mg/200 mL premix  Status:  Discontinued        1,000 mg 200 mL/hr over 60 Minutes Intravenous Every 12 hours 05/21/11 0855 05/23/11 1101   05/16/11 0932   bacitracin 50,000 Units in sodium chloride irrigation 0.9 % 500 mL irrigation  Status:  Discontinued          As needed 05/16/11 0932 05/16/11 1055   05/16/11 0700   bacitracin 14782 UNITS injection     Comments: Imagene Gurney: cabinet override         05/16/11 0700 05/16/11 1914   05/16/11 0000   ceFAZolin (ANCEF) IVPB  2 g/50 mL premix        2 g 100 mL/hr over 30 Minutes Intravenous 60 min pre-op 05/15/11 1449 05/16/11 0828         Assessment: 70 yom admitted for elective back surgery, now  s/p laminectomy and decompression of L5 S1 on 3/26, then with worsening confusion to start on empiric vancomycin + zosyn for possible pneumonia. Pt is afebrile, WBC WNL. Cultures NGTD. Noted pt with history of renal transplant with high vancomycin troughs in the past. However, his Scr was worse at that time than now but his Scr has been trending up the last few days and now is at 1.54. W  Vancomycin trough above goal at 27.9.  Confirmed with RN, level drawn before dose was hung.  Pt received 1/2 bag of vancomycin before level came back, and it was stopped at that time.  Goal of Therapy:  Vancomycin trough level 15-20 mcg/ml  Plan:  1. Hold vancomycin for now. 2. Recheck random vancomycin level at 2200 PM to reassess need to redose. 3. Continue Zosyn 3.375g IV q 8 hrs. 4. Consider d/c of vancomycin soon?  Gardner Candle 05/23/2011,11:12 AM

## 2011-05-23 NOTE — Progress Notes (Signed)
OT/PT Cancellation Note  Treatment cancelled today due to patient currently sleeping and had not rested all night per nursing. Nursing requesting that we not see him now, but perhaps later today. Will check back as schedule allows.Evette Georges [phone] 05/23/2011, 9:46 AM

## 2011-05-23 NOTE — Progress Notes (Signed)
Pt has been having persistent delerium, agitation, pulling at lines, cords and trying to get out of bed; MDs aware; orders received; sitter at beside; will continue to monitor closely and update as needed

## 2011-05-23 NOTE — Progress Notes (Signed)
eLink Physician-Brief Progress Note Patient Name: SAVIOR HIMEBAUGH DOB: Nov 05, 1940 MRN: [id number]  Date of Service  05/23/2011   HPI/Events of Note   Still pulling at tubes/lines requiring near constant intervention; QTc 515  eICU Interventions  Hold further haldol Seroquel oral low dose now RN to give scheduled oxycodone now   Intervention Category Major Interventions: Delirium, psychosis, severe agitation - evaluation and management  Kathelyn Gombos 05/23/2011, 2:51 AM

## 2011-05-23 NOTE — Progress Notes (Signed)
eLink Physician-Brief Progress Note Patient Name: John [name] DOB: 03/06/1940 MRN: [id number]  Date of Service  05/23/2011   HPI/Events of Note   Delirium noted by RN, pulling at lines, gown  eICU Interventions  Additional IM Haldol written now with EKG to monitor qtc     Intervention Category Major Interventions: Delirium, psychosis, severe agitation - evaluation and management  [name] 05/23/2011, 12:27 AM

## 2011-05-23 NOTE — Progress Notes (Signed)
CSW spoke with pt's wife regarding bed offers. Pt's wife shared that her daughter is assisting in choosing a facility. CSW addressed pt's wife's questions regarding SNF placement. CSW will continue to follow to facilitate discharge to SNF and provide psychosocial support.   Dede Query, MSW, Theresia Majors [phone]

## 2011-05-23 NOTE — Progress Notes (Signed)
John [name], PT [phone]  

## 2011-05-23 NOTE — Progress Notes (Signed)
HPI: 71 yo male with hx COPD, renal transplant (2002), DM on insulin pump who presented 3/26 for lumbar laminotomies and decompression L5 and S1 r/t spondylosis and herniated nucleus pulposus. Post op developed episodes of confusion and respiratory distress thought r/t CHF Which initially improved with lasix. On 3/31 developed worsening respiratory distress and hypoxia and PCCM consulted.   Antibiotics:  Vanc 3/31>>>  Zosyn 3/31>>>   Cultures/Sepsis Markers:   Urine 3/29>>> MORGANELLA MORGANII (6,000 COLONIES/ML)  BCx2 3/28>>> negative  Sputum 3/31>>>  Pct 3/31>>>   Access/Protocols:  L IJ CVL>> 3/31   Best Practice:  DVT: SCD  GI: Pantoprazole, Colace   Subjective: Patient laying in bed, NAD, no new complaints.   Physical Exam: Filed Vitals:   05/23/11 0700  BP:   Pulse: 72  Temp:   Resp:     Intake/Output Summary (Last 24 hours) at 05/23/11 0855 Last data filed at 05/23/11 0700  Gross per 24 hour  Intake   1264 ml  Output   2050 ml  Net   -786 ml   Vent Mode:  [-]  FiO2 (%):  [50 %] 50 %  General: chronically ill appearing male, AOx1  Neuro: awake, confused, MAE, gen weakness  CV: irregularly irregular, no m/r/g  PULM: resps even, mildly labored, diminished throughout with few scattered ronchi  GI: abd soft, +bs  Extremities: Warm and dry, trace BLE edema   Labs: CBC:    Component Value Date/Time   WBC 7.3 05/22/2011 0400   HGB 8.6* 05/22/2011 0400   HCT 27.7* 05/22/2011 0400   PLT 174 05/22/2011 0400   MCV 69.9* 05/22/2011 0400   NEUTROABS 8.0* 05/20/2011 0630   LYMPHSABS 1.1 05/20/2011 0630   MONOABS 0.9 05/20/2011 0630   EOSABS 0.1 05/20/2011 0630   BASOSABS 0.0 05/20/2011 0630   Basic Metabolic Panel:    Component Value Date/Time   NA 141 05/23/2011 0537   K 3.0* 05/23/2011 0537   CL 95* 05/23/2011 0537   CO2 35* 05/23/2011 0537   BUN 65* 05/23/2011 0537   CREATININE 1.54* 05/23/2011 0537   GLUCOSE 194* 05/23/2011 0537   CALCIUM 10.0 05/23/2011 0537   Comprehensive  Metabolic Panel:    Component Value Date/Time   NA 141 05/23/2011 0537   K 3.0* 05/23/2011 0537   CL 95* 05/23/2011 0537   CO2 35* 05/23/2011 0537   BUN 65* 05/23/2011 0537   CREATININE 1.54* 05/23/2011 0537   GLUCOSE 194* 05/23/2011 0537   CALCIUM 10.0 05/23/2011 0537   AST 16 05/20/2011 0836   ALT 6 05/20/2011 0836   ALKPHOS 121* 05/20/2011 0836   BILITOT 0.8 05/20/2011 0836   PROT 6.3 05/20/2011 0836   ALBUMIN 2.5* 05/23/2011 0537   ABG    Component Value Date/Time   PHART 7.409 05/21/2011 1029   PCO2ART 51.6* 05/21/2011 1029   PO2ART 95.0 05/21/2011 1029   HCO3 32.7* 05/21/2011 1029   TCO2 34 05/21/2011 1029   ACIDBASEDEF 1.0 04/22/2009 0905   O2SAT 97.0 05/21/2011 1029    No results found for this basename: MG in the last 168 hours Lab Results  Component Value Date   CALCIUM 10.0 05/23/2011   PHOS 3.3 05/23/2011    Assessment & Plan:  Acute respiratory failure - likely multifactorial in setting volume overload with underlying COPD +/- HCAP post lumbar surgery in immunosuppressed patient. Patient is afebrile without WBC elevation. CXR shows diffuse edema without clear focal infiltrate.  - Cont diuresis with IV lasix.  - Appreciate  ID recs, will continue empiric abx for ?HCAP with vanc and zosyn until further cultures return, and pan-culture of spikes fever.   DM -  home on insulin pump.  - D/C insulin pump while critically ill.  - Lantus to 12 units.   HTN -  - Cont diuresis  - Add PRN hydralazine, hold SBP < 140.   Chronic renal insufficiency s/p renal transplant 2002 -  Renal following.  - Cont cellcept, prograf  - Diuresis per renal  - Check tacrolimus level in setting of worsening AKI.  Best practices / Disposition - -->ICU status under PCCM  -->full code  -->SCD's for DVT Px  -->Protonix and Colace for GI Px   TOBBIA,PATRICK, MD  Continue active diureses at a lower dose considering renal function.  Will also replace K and have a f/u CXR.  Spoke with the patient's family  extensively, patient is decompensating from a respiratory standpoint and renal function is deteriorating.  After discussion decided to make patient a LCB with no CPR and no cardioversion.  Continue diureses and follow renal function.  CC time 35 minutes.  Patient seen and examined, agree with above note.  I dictated the care and orders written for this patient under my direction.  Koren Bound, M.D. [phone]

## 2011-05-23 NOTE — Progress Notes (Signed)
Pt is much more relaxed at this time after getting bathed; Denies any needs or wants; will continue to monitor closely and update as needed; sitter at bedside

## 2011-05-23 NOTE — Progress Notes (Signed)
[facility] Progress Note  Assessment & Plan  1. CKD / renal transplant 2002 / Cr 1.1-1.3. Home regimen includes Prograf 5mg  bid, CellCept 500mg  bid and prednisone 5 mg/d. On stress dose IV steroids will decrease further.  Renal function worsening, creat up 1.0 > 1.54 in hospital. UOP improved; he was taking Furosemide 80am and 40pm at home. Tacrolimus level is pending still! Will stop Toradol due to renal status. 2. Acute diastolic congestive heart failure: Lasix was started on March 28 . BNP elevated to 2714 , chest x-ray Consistent with pulmonary edema, and pl effusions. Cardiac enzymes negative.  Subjective:   Confusion last night and agitation   Objective:   BP 158/76  Pulse 72  Temp(Src) 97.9 F (36.6 C) (Axillary)  Resp 17  Ht 6\' 2"  (1.88 m)  Wt 98.3 kg (216 lb 11.4 oz)  BMI 27.82 kg/m2  SpO2 100%  Intake/Output Summary (Last 24 hours) at 05/23/11 0849 Last data filed at 05/23/11 0700  Gross per 24 hour  Intake   1264 ml  Output   2050 ml  Net   -786 ml   Weight change:   Physical Exam: [id number] CVS:RRR Resp:clear anteriorly [id number] Ext:tr edema- 1+  Imaging: Dg Chest Port 1 View  05/22/2011  *RADIOLOGY REPORT*  Clinical Data: Respiratory failure  PORTABLE CHEST - 1 VIEW  Comparison: 05/21/2011  Findings: Cardiomediastinal silhouette is stable.  Stable left IJ central line position.  No significant change.  Bilateral edema or infiltrates again noted.  Probable small right pleural effusion with right basilar atelectasis or infiltrate. Status post CABG.  IMPRESSION: No significant change.  Bilateral edema or infiltrates again noted. Probable small right pleural effusion with right basilar atelectasis or infiltrate.  Original Report Authenticated By: Natasha Mead, M.D.   Dg Chest Port 1 View  05/21/2011  *RADIOLOGY REPORT*  Clinical Data: Central line placement.  PORTABLE CHEST - 1 VIEW  Comparison: Earlier film of the same day  Findings: Interval  placement of a left IJ central line, tip in the proximal SVC.  No pneumothorax.  Previous CABG.  Some interval worsening of diffuse interstitial edema or infiltrates with central pulmonary vascular congestion.  Stable right lateral pleural thickening or effusion.  IMPRESSION:  1.  Central line placement to proximal SVC without pneumothorax. 2.  Slight worsening of bilateral infiltrates or edema.  Original Report Authenticated By: Thora Lance III, M.D.    Labs: BMET  Lab 05/23/11 4403 05/22/11 0400 05/21/11 0612 05/20/11 0630 05/19/11 1050 05/19/11 0640 05/18/11 0610  NA 141 141 138 136 135 137 137  K 3.0* 3.9 4.9 4.8 4.5 4.6 4.6  CL 95* 99 97 99 97 100 100  CO2 35* 34* 31 31 29 29 30   GLUCOSE 194* 222* 198* 183* 113* 92 101*  BUN 65* 63* 50* 37* 29* 29* 21  CREATININE 1.54* 1.46* 1.44* 1.36* 1.33 1.43* 1.07  ALB -- -- -- -- -- -- --  CALCIUM 10.0 10.1 10.1 9.6 9.6 9.7 9.5  PHOS 3.3 -- 3.9 -- -- -- --   CBC  Lab 05/22/11 0400 05/21/11 0612 05/20/11 0630 05/18/11 0610  WBC 7.3 9.3 10.2 12.1*  NEUTROABS -- -- 8.0* --  HGB 8.6* 9.3* 8.8* 9.3*  HCT 27.7* 30.6* 28.1* 30.3*  MCV 69.9* 70.7* 71.0* 71.3*  PLT 174 178 151 143*    Medications:      . aspirin EC  81 mg Oral Daily  . calcium carbonate  2 tablet Oral Daily  .  carbidopa-levodopa  1 tablet Oral TID  . docusate sodium  100 mg Oral BID  . furosemide  60 mg Intravenous TID  . haloperidol lactate  5 mg Intramuscular Once  . haloperidol lactate  5 mg Intramuscular Once  . hydrocortisone sod succinate (SOLU-CORTEF) injection  50 mg Intravenous Q8H  . insulin aspart  0-20 Units Subcutaneous Q4H  . insulin glargine  12 Units Subcutaneous Daily  . ipratropium  0.5 mg Nebulization QID  . lamoTRIgine  200 mg Oral Daily  . levalbuterol  0.63 mg Nebulization QID  . linagliptin  5 mg Oral Daily  . mycophenolate  500 mg Oral BID  . pantoprazole  40 mg Oral BID  . piperacillin-tazobactam (ZOSYN)  IV  3.375 g Intravenous Q8H  .  pyridostigmine  60 mg Oral TID  . QUEtiapine  25 mg Oral Once  . risperiDONE  1 mg Oral QHS  . senna  1 tablet Oral BID  . sodium chloride  3 mL Intravenous Q12H  . tacrolimus  5 mg Oral BID  . Tamsulosin HCl  0.4 mg Oral Daily  . vancomycin  1,000 mg Intravenous Q12H  . DISCONTD: furosemide  80 mg Intravenous TID  . DISCONTD: hydrocortisone sod succinate (SOLU-CORTEF) injection  100 mg Intravenous Q8H  . DISCONTD: insulin glargine  10 Units Subcutaneous Daily

## 2011-05-23 NOTE — Progress Notes (Signed)
eLink Physician-Brief Progress Note Patient Name: KORI COLIN DOB: 01/06/1941 MRN: [id number]  Date of Service  05/23/2011   HPI/Events of Note   Persistent delirium, haldol helped  eICU Interventions  Repeat haldol IM dose now   Intervention Category Major Interventions: Delirium, psychosis, severe agitation - evaluation and management  Olaoluwa Grieder 05/23/2011, 1:41 AM

## 2011-05-24 ENCOUNTER — Inpatient Hospital Stay (HOSPITAL_COMMUNITY): Payer: Medicare Other

## 2011-05-24 DIAGNOSIS — G9340 Encephalopathy, unspecified: Secondary | ICD-10-CM

## 2011-05-24 DIAGNOSIS — J9600 Acute respiratory failure, unspecified whether with hypoxia or hypercapnia: Secondary | ICD-10-CM

## 2011-05-24 DIAGNOSIS — R509 Fever, unspecified: Secondary | ICD-10-CM

## 2011-05-24 LAB — RENAL FUNCTION PANEL
Albumin: 2.6 g/dL — ABNORMAL LOW (ref 3.5–5.2)
CO2: 37 mEq/L — ABNORMAL HIGH (ref 19–32)
Chloride: 95 mEq/L — ABNORMAL LOW (ref 96–112)
GFR calc Af Amer: 54 mL/min — ABNORMAL LOW (ref >90)
GFR calc non Af Amer: 46 mL/min — ABNORMAL LOW (ref >90)
Potassium: 3.2 mEq/L — ABNORMAL LOW (ref 3.5–5.1)
Sodium: 141 mEq/L (ref 135–145)

## 2011-05-24 LAB — CBC
MCH: 22.1 pg — ABNORMAL LOW (ref 26.0–34.0)
MCHC: 32.4 g/dL (ref 30.0–36.0)
MCV: 68.3 fL — ABNORMAL LOW (ref 78.0–100.0)
Platelets: 230 10*3/uL (ref 150–400)

## 2011-05-24 LAB — POCT I-STAT 3, ART BLOOD GAS (G3+)
Acid-Base Excess: 14 mmol/L — ABNORMAL HIGH (ref 0.0–2.0)
Bicarbonate: 38 mEq/L — ABNORMAL HIGH (ref 20.0–24.0)
O2 Saturation: 99 %
pCO2 arterial: 46 mmHg — ABNORMAL HIGH (ref 35.0–45.0)
pO2, Arterial: 138 mmHg — ABNORMAL HIGH (ref 80.0–100.0)

## 2011-05-24 LAB — DIFFERENTIAL
Basophils Absolute: 0 10*3/uL (ref 0.0–0.1)
Eosinophils Absolute: 0.5 10*3/uL (ref 0.0–0.7)
Eosinophils Relative: 4 % (ref 0–5)
Lymphs Abs: 1.7 10*3/uL (ref 0.7–4.0)
Neutrophils Relative %: 72 % (ref 43–77)

## 2011-05-24 LAB — PRO B NATRIURETIC PEPTIDE: Pro B Natriuretic peptide (BNP): 5499 pg/mL — ABNORMAL HIGH (ref 0–125)

## 2011-05-24 LAB — LIPID PANEL
Cholesterol: 129 mg/dL (ref 0–200)
Total CHOL/HDL Ratio: 4.3 RATIO

## 2011-05-24 LAB — VANCOMYCIN, RANDOM: Vancomycin Rm: 16.9 ug/mL

## 2011-05-24 LAB — GLUCOSE, CAPILLARY
Glucose-Capillary: 93 mg/dL (ref 70–99)
Glucose-Capillary: 160 mg/dL — ABNORMAL HIGH (ref 70–99)

## 2011-05-24 LAB — MAGNESIUM: Magnesium: 2.1 mg/dL (ref 1.5–2.5)

## 2011-05-24 MED ORDER — CHLORHEXIDINE GLUCONATE 0.12 % MT SOLN
15.0000 mL | Freq: Two times a day (BID) | OROMUCOSAL | Status: DC
  Administered 2011-05-24 – 2011-06-04 (×23): 15 mL via OROMUCOSAL
  Filled 2011-05-24 (×22): qty 15

## 2011-05-24 MED ORDER — DOCUSATE SODIUM 50 MG/5ML PO LIQD
100.0000 mg | Freq: Two times a day (BID) | ORAL | Status: DC
  Administered 2011-05-24 – 2011-05-29 (×10): 100 mg
  Filled 2011-05-24 (×13): qty 10

## 2011-05-24 MED ORDER — FUROSEMIDE 10 MG/ML IJ SOLN
40.0000 mg | Freq: Two times a day (BID) | INTRAMUSCULAR | Status: DC
  Administered 2011-05-24: 40 mg via INTRAVENOUS
  Filled 2011-05-24 (×4): qty 4

## 2011-05-24 MED ORDER — LIDOCAINE HCL (CARDIAC) 20 MG/ML IV SOLN
INTRAVENOUS | Status: AC
  Filled 2011-05-24: qty 5

## 2011-05-24 MED ORDER — POTASSIUM CHLORIDE 10 MEQ/50ML IV SOLN: 10.0000 meq | INTRAVENOUS | Status: DC

## 2011-05-24 MED ORDER — QUETIAPINE FUMARATE 50 MG PO TABS
50.0000 mg | ORAL_TABLET | Freq: Once | ORAL | Status: AC
  Administered 2011-05-24: 50 mg via ORAL
  Filled 2011-05-24: qty 1

## 2011-05-24 MED ORDER — VANCOMYCIN HCL 1000 MG IV SOLR
1250.0000 mg | INTRAVENOUS | Status: DC
  Filled 2011-05-24: qty 1250

## 2011-05-24 MED ORDER — PROPOFOL 10 MG/ML IV EMUL
INTRAVENOUS | Status: AC
  Filled 2011-05-24: qty 100

## 2011-05-24 MED ORDER — PANTOPRAZOLE SODIUM 40 MG IV SOLR
40.0000 mg | INTRAVENOUS | Status: DC
  Administered 2011-05-24 – 2011-05-26 (×3): 40 mg via INTRAVENOUS
  Filled 2011-05-24 (×4): qty 40

## 2011-05-24 MED ORDER — POTASSIUM CHLORIDE 10 MEQ/100ML IV SOLN
10.0000 meq | INTRAVENOUS | Status: AC
  Administered 2011-05-24 (×3): 10 meq via INTRAVENOUS
  Filled 2011-05-24: qty 400

## 2011-05-24 MED ORDER — ETOMIDATE 2 MG/ML IV SOLN
INTRAVENOUS | Status: AC
  Administered 2011-05-24: 20 mg
  Filled 2011-05-24: qty 20

## 2011-05-24 MED ORDER — BIOTENE DRY MOUTH MT LIQD
1.0000 "application " | Freq: Four times a day (QID) | OROMUCOSAL | Status: DC
  Administered 2011-05-24 – 2011-06-06 (×53): 15 mL via OROMUCOSAL

## 2011-05-24 MED ORDER — SODIUM CHLORIDE 0.9 % IV SOLN
25.0000 ug/h | INTRAVENOUS | Status: DC
  Administered 2011-05-24: 25 ug/h via INTRAVENOUS
  Administered 2011-05-25 – 2011-05-29 (×3): 100 ug/h via INTRAVENOUS
  Administered 2011-05-30: 150 ug/h via INTRAVENOUS
  Administered 2011-05-30: 200 ug/h via INTRAVENOUS
  Filled 2011-05-24 (×7): qty 50

## 2011-05-24 MED ORDER — ROCURONIUM BROMIDE 50 MG/5ML IV SOLN
INTRAVENOUS | Status: AC
  Filled 2011-05-24: qty 2

## 2011-05-24 MED ORDER — TACROLIMUS 1 MG PO CAPS
5.0000 mg | ORAL_CAPSULE | Freq: Two times a day (BID) | ORAL | Status: DC
  Administered 2011-05-25 – 2011-06-08 (×27): 5 mg via ORAL
  Filled 2011-05-24 (×30): qty 5

## 2011-05-24 MED ORDER — ACETAMINOPHEN 160 MG/5ML PO SOLN
650.0000 mg | ORAL | Status: DC | PRN
  Administered 2011-05-27 – 2011-06-01 (×6): 650 mg
  Filled 2011-05-24 (×7): qty 20.3

## 2011-05-24 MED ORDER — SUCCINYLCHOLINE CHLORIDE 20 MG/ML IJ SOLN
INTRAMUSCULAR | Status: AC
  Filled 2011-05-24: qty 10

## 2011-05-24 MED ORDER — ASPIRIN 81 MG PO CHEW
81.0000 mg | CHEWABLE_TABLET | Freq: Every day | ORAL | Status: DC
  Administered 2011-05-25 – 2011-06-08 (×14): 81 mg
  Filled 2011-05-24 (×14): qty 1

## 2011-05-24 MED ORDER — VANCOMYCIN HCL 1000 MG IV SOLR
1250.0000 mg | INTRAVENOUS | Status: DC
  Administered 2011-05-24 – 2011-05-28 (×5): 1250 mg via INTRAVENOUS
  Filled 2011-05-24 (×5): qty 1250

## 2011-05-24 MED ORDER — PROPOFOL 10 MG/ML IV EMUL
5.0000 ug/kg/min | INTRAVENOUS | Status: DC
  Administered 2011-05-24: 30 ug/kg/min via INTRAVENOUS
  Administered 2011-05-24: 20 ug/kg/min via INTRAVENOUS
  Administered 2011-05-25: 40 ug/kg/min via INTRAVENOUS
  Administered 2011-05-25: 20 ug/kg/min via INTRAVENOUS
  Filled 2011-05-24 (×4): qty 100

## 2011-05-24 MED ORDER — MYCOPHENOLATE MOFETIL HCL 500 MG IV SOLR
500.0000 mg | Freq: Two times a day (BID) | INTRAVENOUS | Status: DC
  Administered 2011-05-24 – 2011-05-29 (×11): 500 mg via INTRAVENOUS
  Filled 2011-05-24 (×23): qty 500

## 2011-05-24 MED ORDER — POTASSIUM CHLORIDE 10 MEQ/100ML IV SOLN: 10.0000 meq | INTRAVENOUS | Status: DC

## 2011-05-24 MED ORDER — POTASSIUM CHLORIDE 10 MEQ/50ML IV SOLN
10.0000 meq | INTRAVENOUS | Status: AC
  Administered 2011-05-24 (×3): 10 meq via INTRAVENOUS
  Filled 2011-05-24 (×2): qty 50

## 2011-05-24 MED ORDER — MYCOPHENOLATE MOFETIL HCL 500 MG IV SOLR
0.5000 g | Freq: Two times a day (BID) | INTRAVENOUS | Status: DC
  Filled 2011-05-24: qty 500

## 2011-05-24 NOTE — Progress Notes (Addendum)
OT/PT Discharge  Have not been able to see patient for past two days (2 days ago due to difficulty with maintaining sats, yesterday he had not slept all night and was finally resting); today noted that pt was intubated early this AM--spoke with nurse who states pt is also sedated this AM. Pt having multiple medical issues currently, will sign off OT/PT for now--please re-order as appropriate. Ignacia Palma, [place] [phone] 05/24/2011

## 2011-05-24 NOTE — Plan of Care (Signed)
Problem: Phase I Progression Outcomes Goal: Dyspnea controlled at rest Outcome: Not Progressing Pt is still very tachypnic and using accessory muscles

## 2011-05-24 NOTE — Progress Notes (Signed)
HPI: 71 yo male with hx COPD, renal transplant (2002), DM on insulin pump who presented 3/26 for lumbar laminotomies and decompression L5 and S1 r/t spondylosis and herniated nucleus pulposus. Post op developed episodes of confusion and respiratory distress thought r/t CHF Which initially improved with lasix. On 3/31 developed worsening respiratory distress and hypoxia and PCCM consulted.   Antibiotics:  Vanc 3/31>>>  Zosyn 3/31>>>   Cultures/Sepsis Markers:   Urine 3/29>>> MORGANELLA MORGANII (6,000 COLONIES/ML)  BCx2 3/28>>> negative  Sputum 3/31>>>  Pct 3/31>>>   Access/Protocols:  L IJ CVL>> 3/31  ET tube>> 4/3  Best Practice:  DVT: SCD  GI: Pantoprazole, Colace   Subjective: Patient laying in bed, NAD, no new complaints.   Physical Exam: Filed Vitals:   05/24/11 0803  BP:   Pulse: 88  Temp: 98.4 F (36.9 C)  Resp: 18    Intake/Output Summary (Last 24 hours) at 05/24/11 0856 Last data filed at 05/24/11 0800  Gross per 24 hour  Intake 1065.72 ml  Output   3045 ml  Net -1979.28 ml   Vent Mode:  [-] PRVC FiO2 (%):  [40 %-50 %] 40 % Set Rate:  [12 bmp-16 bmp] 12 bmp Vt Set:  [580 mL] 580 mL PEEP:  [5 cmH20] 5 cmH20 Plateau Pressure:  [11 cmH20-24 cmH20] 11 cmH20  General: chronically ill appearing male, AOx1  Neuro: awake, confused, MAE, gen weakness  CV: irregularly irregular, no m/r/g  PULM: resps even, mildly labored, diminished throughout with few scattered ronchi  GI: abd soft, +bs  Extremities: Warm and dry, trace BLE edema   Labs: CBC:    Component Value Date/Time   WBC 12.3* 05/24/2011 0250   HGB 9.5* 05/24/2011 0250   HCT 29.3* 05/24/2011 0250   PLT 230 05/24/2011 0250   MCV 68.3* 05/24/2011 0250   NEUTROABS 8.0* 05/20/2011 0630   LYMPHSABS 1.1 05/20/2011 0630   MONOABS 0.9 05/20/2011 0630   EOSABS 0.1 05/20/2011 0630   BASOSABS 0.0 05/20/2011 0630   Basic Metabolic Panel:    Component Value Date/Time   NA 141 05/24/2011 0250   K 3.2* 05/24/2011 0250    CL 95* 05/24/2011 0250   CO2 37* 05/24/2011 0250   BUN 54* 05/24/2011 0250   CREATININE 1.47* 05/24/2011 0250   GLUCOSE 129* 05/24/2011 0250   CALCIUM 9.9 05/24/2011 0250   Comprehensive Metabolic Panel:    Component Value Date/Time   NA 141 05/24/2011 0250   K 3.2* 05/24/2011 0250   CL 95* 05/24/2011 0250   CO2 37* 05/24/2011 0250   BUN 54* 05/24/2011 0250   CREATININE 1.47* 05/24/2011 0250   GLUCOSE 129* 05/24/2011 0250   CALCIUM 9.9 05/24/2011 0250   AST 16 05/20/2011 0836   ALT 6 05/20/2011 0836   ALKPHOS 121* 05/20/2011 0836   BILITOT 0.8 05/20/2011 0836   PROT 6.3 05/20/2011 0836   ALBUMIN 2.6* 05/24/2011 0250   ABG    Component Value Date/Time   PHART 7.525* 05/24/2011 0544   PCO2ART 46.0* 05/24/2011 0544   PO2ART 138.0* 05/24/2011 0544   HCO3 38.0* 05/24/2011 0544   TCO2 39 05/24/2011 0544   ACIDBASEDEF 1.0 04/22/2009 0905   O2SAT 99.0 05/24/2011 0544    Lab 05/24/11 0250  MG 2.1   Lab Results  Component Value Date   CALCIUM 9.9 05/24/2011   PHOS 2.4 05/24/2011    Assessment & Plan:  Acute respiratory failure - Likely multifactorial in setting volume overload with underlying COPD +/- HCAP post lumbar  surgery in immunosuppressed patient. Patient is afebrile without WBC elevation. CXR shows diffuse edema without clear focal infiltrate. Overnight patient experienced respiratory distress and had to be intubated. Currently on full vent support.  - Cont diuresis with IV lasix.  - Appreciate ID recs, will continue empiric abx for ?HCAP with vanc and zosyn until further cultures return, and pan-culture of spikes fever.   DM -  home on insulin pump.  - D/C insulin pump while critically ill.  - Lantus and SSI.  HTN -  - Cont diuresis.  Chronic renal insufficiency s/p renal transplant 2002 -  Renal following.  - Cont cellcept, prograf  - Diuresis per renal  - Tacrolimus level is 12.3 (this is above goal trough of 3-8) Will defer to renal to adjust this.   Best practices / Disposition - -->ICU status  under PCCM  -->full code  -->SCD's for DVT Px  -->Protonix and Colace for GI Px   TOBBIA,PATRICK, MD  Family updated bedside, will continue to diurese and wean on a daily bases.  CC time 40 minutes.  Patient seen and examined, agree with above note.  I dictated the care and orders written for this patient under my direction.  Koren Bound, M.D. [phone]

## 2011-05-24 NOTE — Progress Notes (Signed)
eLink Physician-Brief Progress Note Patient Name: John [name] DOB: 28-Dec-1940 MRN: [id number]  Date of Service  05/24/2011   HPI/Events of Note   Delirium again  eICU Interventions  Seroquel now (worked well 4/1)   Intervention Category Major Interventions: Delirium, psychosis, severe agitation - evaluation and management  [name] 05/24/2011, 1:49 AM

## 2011-05-24 NOTE — Progress Notes (Signed)
Agree with OT.  05/24/2011  Everton Bing, PT [phone] (pager)

## 2011-05-24 NOTE — Procedures (Signed)
Name: John [name] MRN: [id number] DOB: 03-12-1940   PROCEDURE NOTE  Procedure:  Endotracheal intubation.  Indication:  Acute respiratory failure  Consent:  Verbal consent was obtained form wife (medical POA)  Anesthesia:  A total of 10 mg of Etomidate was given intravenously.  Procedure summary:  Appropriate equipment was assembled. The patient was identified as John [name] and safety timeout was performed. The patient was placed supine, with head in sniffing position. After adequate level of anesthesia was achieved, a Mac #3 blade was inserted into the oropharynx and the vocal cords were visualized. A 8.0 endotracheal tube was inserted without difficulty and visualized going through the vocal cords. The stylette was removed and cuff inflated. Colorimetric change was noted on the CO2 meter. Breath sounds were heard over both lung fields equally. Post procedure chest xray was ordered.  Complications:  No immediate complications were noted.  Hemodynamic parameters and oxygenation remained stable throughout the procedure.    Orlean Bradford, M.D., F.C.C.P. Pulmonary and Critical Care Medicine [facility] Cell: [phone] Pager: [phone]  05/24/2011, 5:16 AM

## 2011-05-24 NOTE — Progress Notes (Signed)
    Subjective:  Intubated, sedated. Was intubated overnight.  Objective:  Vital Signs in the last 24 hours: Temp:  [98 F (36.7 C)-100.4 F (38 C)] 100.4 F (38 C) (04/03 1144) Pulse Rate:  [61-98] 95  (04/03 1100) Resp:  [13-40] 22  (04/03 1100) BP: (89-157)/(45-90) 127/45 mmHg (04/03 1100) SpO2:  [88 %-100 %] 98 % (04/03 1219) FiO2 (%):  [40 %-50 %] 40 % (04/03 1219) Weight:  [95.8 kg (211 lb 3.2 oz)] 95.8 kg (211 lb 3.2 oz) (04/03 0600)  Intake/Output from previous day: 04/02 0701 - 04/03 0700 In: 971.4 [P.O.:240; I.V.:469.4; IV Piggyback:262] Out: 3045 [Urine:3045]  Physical Exam: Pt is intubated, sedated, poorly responsive HEENT: normal Neck: JVP - mildly elevated Lungs: CTA bilaterally CV: RRR without murmur or gallop Abd: soft, obese Ext: mild edema Skin: warm/dry no rash   Lab Results:  Basename 05/24/11 0250 05/22/11 0400  WBC 12.3* 7.3  HGB 9.5* 8.6*  PLT 230 174    Basename 05/24/11 0250 05/23/11 0537  NA 141 141  K 3.2* 3.0*  CL 95* 95*  CO2 37* 35*  GLUCOSE 129* 194*  BUN 54* 65*  CREATININE 1.47* 1.54*   No results found for this basename: TROPONINI:2,CK,MB:2 in the last 72 hours  Tele: sinus rhythm  Assessment/Plan:  1. Acute respiratory failure 2. Acute on chronic diastolic heart failure 3. CKD s/p renal transplant 4. Diabetes 5. HTN  Still with bilateral airspace disease on CXR, elevated BNP, suggestive of persistent CHF.  Will defer diuretic management to renal team in this patient with hx of renal transplant.   Tonny Bollman, M.D. 05/24/2011, 1:47 PM

## 2011-05-24 NOTE — Progress Notes (Signed)
eLink Physician-Brief Progress Note Patient Name: ROLONDO PIERRE DOB: Jan 16, 1941 MRN: [id number]  Date of Service  05/24/2011   HPI/Events of Note     eICU Interventions  Hypokalemia, repleted    Intervention Category Intermediate Interventions: Electrolyte abnormality - evaluation and management  Lorena Benham 05/24/2011, 5:38 AM

## 2011-05-24 NOTE — Progress Notes (Signed)
Pt is becoming more agitated and restless; pull at lines more and trying to get out of bed; sitter at bedside having to give pt constant attention; MD notified; orders received for seroquel.  Will continue to monitor closely and update as needed

## 2011-05-24 NOTE — Progress Notes (Signed)
Pt has increased respiratory rate in the 40s; using accessory muscles; MD notified; MD came to bedside; MD decided to intubate pt; MD. RT & RN at bedside; ventilator and sedation orders received; will continue to monitor closely and update as needed; safety sitter still at bedside;

## 2011-05-24 NOTE — Progress Notes (Signed)
Discussed elevated Tacrolimus level with Dr. Lowell Guitar, which suggested to skip tonight's dose and recheck level in AM. Orders placed.

## 2011-05-24 NOTE — Progress Notes (Signed)
eLink Physician-Brief Progress Note Patient Name: REICE BIENVENUE DOB: 10-15-1940 MRN: [id number]  Date of Service  05/24/2011   HPI/Events of Note     eICU Interventions     Intervention Category Intermediate Interventions: Respiratory distress - evaluation and management  Javeria Briski 05/24/2011, 4:32 AM

## 2011-05-24 NOTE — Progress Notes (Signed)
ANTIBIOTIC CONSULT NOTE - Follow-up  Pharmacy Consult for vancomycin Indication: rule out pneumonia  No Known Allergies  Patient Measurements: Height: 6\' 2"  (188 cm) Weight: 211 lb 3.2 oz (95.8 kg) (bed weight) IBW/kg (Calculated) : 82.2   Vital Signs: Temp: 101.2 F (38.4 C) (04/03 1912) Temp src: Oral (04/03 1912) BP: 125/55 mmHg (04/03 1800) Pulse Rate: 86  (04/03 1800) Intake/Output from previous day: 04/02 0701 - 04/03 0700 In: 971.4 [P.O.:240; I.V.:469.4; IV Piggyback:262] Out: 3045 [Urine:3045] Intake/Output from this shift:    Labs:  Basename 05/24/11 0250 05/23/11 0537 05/22/11 0400  WBC 12.3* -- 7.3  HGB 9.5* -- 8.6*  PLT 230 -- 174  LABCREA -- -- --  CREATININE 1.47* 1.54* 1.46*   Estimated Creatinine Clearance: 53.6 ml/min (by C-G formula based on Cr of 1.47).  Basename 05/24/11 1741 05/23/11 2350 05/23/11 1000  VANCOTROUGH -- -- 27.9*  VANCOPEAK -- -- --  VANCORANDOM 16.9 26.3 --  GENTTROUGH -- -- --  GENTPEAK -- -- --  GENTRANDOM -- -- --  TOBRATROUGH -- -- --  TOBRAPEAK -- -- --  TOBRARND -- -- --  AMIKACINPEAK -- -- --  AMIKACINTROU -- -- --  AMIKACIN -- -- --     Microbiology: Recent Results (from the past 720 hour(s))  SURGICAL PCR SCREEN     Status: Normal   Collection Time   05/10/11  1:34 PM      Component Value Range Status Comment   MRSA, PCR NEGATIVE  NEGATIVE  Final    Staphylococcus aureus NEGATIVE  NEGATIVE  Final   CULTURE, BLOOD (ROUTINE X 2)     Status: Normal (Preliminary result)   Collection Time   05/18/11  6:45 PM      Component Value Range Status Comment   Specimen Description BLOOD LEFT ARM   Final    Special Requests BOTTLES DRAWN AEROBIC ONLY 10CC   Final    Culture  Setup Time [id number]   Final    Culture     Final    Value:        BLOOD CULTURE RECEIVED NO GROWTH TO DATE CULTURE WILL BE HELD FOR 5 DAYS BEFORE ISSUING A FINAL NEGATIVE REPORT   Report Status PENDING   Incomplete   CULTURE, BLOOD (ROUTINE X 2)      Status: Normal (Preliminary result)   Collection Time   05/18/11  6:50 PM      Component Value Range Status Comment   Specimen Description BLOOD RIGHT HAND   Final    Special Requests BOTTLES DRAWN AEROBIC ONLY 10CC   Final    Culture  Setup Time [id number]   Final    Culture     Final    Value:        BLOOD CULTURE RECEIVED NO GROWTH TO DATE CULTURE WILL BE HELD FOR 5 DAYS BEFORE ISSUING A FINAL NEGATIVE REPORT   Report Status PENDING   Incomplete   URINE CULTURE     Status: Normal   Collection Time   05/19/11 10:33 AM      Component Value Range Status Comment   Specimen Description URINE, CATHETERIZED   Final    Special Requests CONDOM   Final    Culture  Setup Time [id number]   Final    Colony Count 6,000 COLONIES/ML   Final    Culture [facility] MORGANII   Final    Report Status 05/22/2011 FINAL   Final    Organism ID, Bacteria MORGANELLA MORGANII  Final   CULTURE, SPUTUM-ASSESSMENT     Status: Normal   Collection Time   05/19/11 11:16 AM      Component Value Range Status Comment   Specimen Description SPUTUM   Final    Special Requests NONE   Final    Sputum evaluation     Final    Value: MICROSCOPIC FINDINGS SUGGEST THAT THIS SPECIMEN IS NOT REPRESENTATIVE OF LOWER RESPIRATORY SECRETIONS. PLEASE RECOLLECT.     Gram Stain Report Called to,Read Back By and Verified With: Wu RN 11:55 05/19/11 ([email or web address])   Report Status 05/19/2011 FINAL   Final   CULTURE, SPUTUM-ASSESSMENT     Status: Normal   Collection Time   05/19/11  5:52 PM      Component Value Range Status Comment   Specimen Description SPUTUM   Final    Special Requests Immunocompromised   Final    Sputum evaluation     Final    Value: MICROSCOPIC FINDINGS SUGGEST THAT THIS SPECIMEN IS NOT REPRESENTATIVE OF LOWER RESPIRATORY SECRETIONS. PLEASE RECOLLECT.     CALLED TO RN D.JOHNSON AT 2016 BY L.PITT 0/29/13   Report Status 05/19/2011 FINAL   Final   MRSA PCR SCREENING     Status: Normal   Collection Time   05/21/11   9:50 AM      Component Value Range Status Comment   MRSA by PCR NEGATIVE  NEGATIVE  Final     Medical History: Past Medical History  Diagnosis Date  . CAD (coronary artery disease)     a. myoview 1/11: lg inf fixed defect;   b. cath 1/11: 3v CAD,  c. s/p CABG 1/11 c/b acute renal failure and AFib  . Atrial fibrillation   . Chronic diastolic heart failure     a. echo 4/11: EF 45-50%, mod LVH, LAE, inf and septal HK  . First degree AV block   . Orthostatic hypotension     pyridostigmine  . History of renal transplant   . DM2 (diabetes mellitus, type 2)   . Diabetic gastroparesis   . HTN (hypertension)   . HLD (hyperlipidemia)   . PAD (peripheral artery disease)     s/p R SFA-Pop atherectomy 11/11  . Prostate cancer   . ED (erectile dysfunction)   . DDD (degenerative disc disease), lumbar   . Tremor   . Obesity   . Low testosterone   . Chronic kidney disease     s/p renal transplant 2002  . Pleural effusion, right     loculated  . COPD (chronic obstructive pulmonary disease)   . Anxiety   . Diabetes mellitus     insulin pump    Medications:  Anti-infectives     Start     Dose/Rate Route Frequency Ordered Stop   05/24/11 1800   vancomycin (VANCOCIN) 1,250 mg in sodium chloride 0.9 % 250 mL IVPB  Status:  Discontinued        1,250 mg 166.7 mL/hr over 90 Minutes Intravenous Every 24 hours 05/24/11 0141 05/24/11 0940   05/21/11 1000   piperacillin-tazobactam (ZOSYN) IVPB 3.375 g        3.375 g 12.5 mL/hr over 240 Minutes Intravenous Every 8 hours 05/21/11 0855     05/21/11 1000   vancomycin (VANCOCIN) IVPB 1000 mg/200 mL premix  Status:  Discontinued        1,000 mg 200 mL/hr over 60 Minutes Intravenous Every 12 hours 05/21/11 0855 05/23/11 1101   05/16/11 0932   bacitracin 50,000  Units in sodium chloride irrigation 0.9 % 500 mL irrigation  Status:  Discontinued          As needed 05/16/11 0932 05/16/11 1055   05/16/11 0700   bacitracin 16109 UNITS injection      Comments: Imagene Gurney: cabinet override         05/16/11 0700 05/16/11 1914   05/16/11 0000   ceFAZolin (ANCEF) IVPB 2 g/50 mL premix        2 g 100 mL/hr over 30 Minutes Intravenous 60 min pre-op 05/15/11 1449 05/16/11 0828         Assessment: 70 yom admitted for elective back surgery, now s/p laminectomy and decompression of L5 S1 on 3/26, then with worsening confusion to start on empiric vancomycin + zosyn for possible pneumonia.   Patient was previously on vancomycin 1gm IV Q12H with last complete dose given 05/22/11 at 21:00.  05/23/11 AM vancomycin trough was above-goal at 27.9 mcg/mL. Patient received 1/2 bag of vancomycin before level came back, and it was stopped at that time.  05/23/11 PM vancomycin level above-goal at 26.3 mcg/mL.   4/3 @ 17:40 vanc level 16.9  Goal of Therapy:  Vancomycin trough level 15-20 mcg/ml  Plan:  1. Vancomycin 1250 gm IV Q24H starting 05/24/11 at 22:00  Dannon Perlow Poteet 05/24/2011,7:19 PM

## 2011-05-24 NOTE — Progress Notes (Signed)
Patient Name: John [name] Date of Encounter: 05/24/2011  Principal Problem:  *Herniated nucleus pulposus Active Problems:  HYPERLIPIDEMIA-MIXED  Coronary atherosclerosis of native coronary artery  CORONARY ATHEROSLERO UNSPEC TYPE BYPASS GRAFT  Atrial fibrillation  Chronic diastolic heart failure  PVD WITH CLAUDICATION  Chronic kidney disease  COPD (chronic obstructive pulmonary disease)  First degree AV block  Spondylosis  Diastolic CHF, acute on chronic  DM (diabetes mellitus), type 2  Insulin pump in place  Altered mental status  Acute respiratory failure  Pulmonary edema  Hypoxemia   LOS: 8 Days  SUBJECTIVE: Pt intubated and sedated. Per wife, he had been doing better yesterday but resp status worsened during the night. She agreed to the intubation but does not want CPR or want him to live on the vent. She did want to give him one last chance.   OBJECTIVE  Filed Vitals:   05/24/11 0700 05/24/11 0730 05/24/11 0800 05/24/11 0803  BP: 109/55 113/47 122/49   Pulse: 76 78 84 88  Temp:    98.4 F (36.9 C)  TempSrc:    Oral  Resp: 13 15 14 18   Height:      Weight:      SpO2: 98% 99% 99% 100%    Intake/Output Summary (Last 24 hours) at 05/24/11 0929 Last data filed at 05/24/11 0800  Gross per 24 hour  Intake 1045.72 ml  Output   3045 ml  Net -1999.28 ml   Weight change: -5 lb 8.2 oz (-2.5 kg) Wt Readings from Last 3 Encounters:  05/24/11 211 lb 3.2 oz (95.8 kg)  05/24/11 211 lb 3.2 oz (95.8 kg)  05/10/11 230 lb 2.6 oz (104.4 kg)     PHYSICAL EXAM  General: Well developed, white male, sedated on the vent Head: Normocephalic, atraumatic.  Neck: Supple without bruits, JVD minimally elevated but difficult to assess secondary to equipment. Lungs:  Resp regular on vent, bibasilar crackles. Heart: RRR, S1, S2, no S3, S4, soft murmur. Abdomen: Soft, non-tender, non-distended, BS + x 4.  Extremities: No clubbing, cyanosis, no LE edema.  Neuro: sedated on  vent  LABS: CBC: Basename 05/24/11 0250 05/22/11 0400  WBC 12.3* 7.3  NEUTROABS -- --  HGB 9.5* 8.6*  HCT 29.3* 27.7*  MCV 68.3* 69.9*  PLT 230 174   Basic Metabolic Panel: Basename 05/24/11 0250 05/23/11 0537  NA 141 141  K 3.2* 3.0*  CL 95* 95*  CO2 37* 35*  GLUCOSE 129* 194*  BUN 54* 65*  CREATININE 1.47* 1.54*  CALCIUM 9.9 10.0  MG 2.1 --  PHOS 2.4 3.3   Liver Function Tests: Basename 05/24/11 0250 05/23/11 0537  AST -- --  ALT -- --  ALKPHOS -- --  BILITOT -- --  PROT -- --  ALBUMIN 2.6* 2.5*   Cardiac Enzymes: Basename 05/21/11 1126  CKTOTAL 36  CKMB 1.8  CKMBINDEX --  TROPONINI <0.30   BNP: Pro B Natriuretic peptide (BNP)  Date/Time Value [name] Status  05/18/2011  6:10 AM 2714.0* 0-125 (pg/mL) Final  12/01/2010 11:56 AM 197.0* 0.0-100.0 (pg/mL) Final    TELE:  SR       Radiology/Studies: Dg Chest Port 1 View 05/24/2011  *RADIOLOGY REPORT*  Clinical Data: Check ET tube position.  PORTABLE CHEST - 1 VIEW  Comparison: 05/22/2011  Findings: Interval placement of an endotracheal tube with tip about 5.7 cm above the carina.  Stable position of a left central venous catheter.  Enteric tube has been placed.  The tip  is not visualized but is below the left hemidiaphragm consistent with location at least in the stomach.  Prominent heart size and pulmonary vascularity with diffuse airspace infiltrates in the lung suggesting edema or pneumonia.  IMPRESSION: Endotracheal tube tip is about 5.7 cm above the carina.  Persistent bilateral airspace infiltrates or edema.  Original Report Authenticated By: Marlon Pel, M.D.  uthenticated By: Danae Orleans, M.D.    Current Medications:    . antiseptic oral rinse  1 application Mouth Rinse QID  . aspirin EC  81 mg Oral Daily  . calcium carbonate  2 tablet Oral Daily  . carbidopa-levodopa  1 tablet Oral TID  . chlorhexidine  15 mL Mouth/Throat BID  . docusate sodium  100 mg Oral BID  . etomidate      . furosemide   60 mg Intravenous TID  . hydrocortisone sod succinate (SOLU-CORTEF) injection  20 mg Intravenous Daily  . insulin aspart  0-20 Units Subcutaneous Q4H  . insulin glargine  12 Units Subcutaneous Daily  . ipratropium  0.5 mg Nebulization QID  . lamoTRIgine  200 mg Oral Daily  . levalbuterol  0.63 mg Nebulization QID  . lidocaine (cardiac) 100 mg/44ml      . linagliptin  5 mg Oral Daily  . mycophenolate  500 mg Oral BID  . pantoprazole (PROTONIX) IV  40 mg Intravenous Q24H  . piperacillin-tazobactam (ZOSYN)  IV  3.375 g Intravenous Q8H  . potassium chloride  10 mEq Intravenous Q1 Hr x 4  . potassium chloride  10 mEq Intravenous Q1 Hr x 4  . potassium chloride  40 mEq Oral Once  . propofol      . pyridostigmine  60 mg Oral TID  . QUEtiapine  50 mg Oral Once  . rocuronium      . senna  1 tablet Oral BID  . sodium chloride  3 mL Intravenous Q12H  . succinylcholine      . tacrolimus  5 mg Oral BID  . Tamsulosin HCl  0.4 mg Oral Daily  . vancomycin  1,250 mg Intravenous Q24H    ASSESSMENT AND PLAN: 1. Post-op delirium - after back surgery 3/26  2. Acute on chronic diastolic heart failure - good UOP on Lasix 60 mg IV q 8 hr  3. VDRF - pt with worsened resp status early this am and intubated by CCM. Weaning trial later today  4. CAD without active angina - No c/o chest pain. Telemetry is SR  5. COPD - home O2 - per CCM  6. S/p renal transplant  - per CCM   Signed, Theodore Demark , PA-C 9:29 AM 05/24/2011 Patient seen, examined. Available data reviewed. Agree with findings, assessment, and plan as outlined by Theodore Demark, PA-C.  Tonny Bollman, M.D. 05/24/2011 5:19 PM

## 2011-05-24 NOTE — Progress Notes (Signed)
eLink Physician-Brief Progress Note Patient Name: JUMA OXLEY DOB: 1940/06/03 MRN: [id number]  Date of Service  05/24/2011   HPI/Events of Note   resp alkalosis on ABG  eICU Interventions  Decrease RR to 12 on vent   Intervention Category Major Interventions: Acid-Base disturbance - evaluation and management  Nyxon Strupp 05/24/2011, 5:50 AM

## 2011-05-24 NOTE — Progress Notes (Addendum)
ID PROGRESS NOTE 71 yo Male with history of DM, HTN, CAD, ESRD s/p Kidney allograft in 2002, IMS on cellcept 500mg  BID, tacro 5 BID,hydrocort 20mg  IV daily, presented for elective laminectomy c/b respiratory distress and intermittent fevers concerning for infection in immunocompromised host, currently on vanco and piptazo  24hr events: Became increasingly agitated, also noted to have tachypnea and acc. Muscle use. Intubated overnight. Repeat vanco level still elevated last night, no further dose given. Patient sedated/intubated.   Addendum: patient during exam this morning is diaphoretic, and warm to touch, RN checked temp to find 100.14F oral.  abtx day #4, vanco (supratherapeutic) & pip/tazo     . antiseptic oral rinse  1 application Mouth Rinse QID  . aspirin EC  81 mg Oral Daily  . calcium carbonate  2 tablet Oral Daily  . carbidopa-levodopa  1 tablet Oral TID  . chlorhexidine  15 mL Mouth/Throat BID  . docusate sodium  100 mg Oral BID  . etomidate      . furosemide  60 mg Intravenous TID  . hydrocortisone sod succinate (SOLU-CORTEF) injection  20 mg Intravenous Daily  . insulin aspart  0-20 Units Subcutaneous Q4H  . insulin glargine  12 Units Subcutaneous Daily  . ipratropium  0.5 mg Nebulization QID  . lamoTRIgine  200 mg Oral Daily  . levalbuterol  0.63 mg Nebulization QID  . lidocaine (cardiac) 100 mg/61ml      . linagliptin  5 mg Oral Daily  . mycophenolate  500 mg Oral BID  . pantoprazole (PROTONIX) IV  40 mg Intravenous Q24H  . piperacillin-tazobactam (ZOSYN)  IV  3.375 g Intravenous Q8H  . potassium chloride  10 mEq Intravenous Q1 Hr x 4  . potassium chloride  10 mEq Intravenous Q1 Hr x 4  . potassium chloride  40 mEq Oral Once  . propofol      . pyridostigmine  60 mg Oral TID  . QUEtiapine  50 mg Oral Once  . rocuronium      . senna  1 tablet Oral BID  . sodium chloride  3 mL Intravenous Q12H  . succinylcholine      . tacrolimus  5 mg Oral BID  . Tamsulosin HCl   0.4 mg Oral Daily  . DISCONTD: pantoprazole  40 mg Oral BID  . DISCONTD: risperiDONE  1 mg Oral QHS  . DISCONTD: vancomycin  1,250 mg Intravenous Q24H  . DISCONTD: vancomycin  1,000 mg Intravenous Q12H    Objective: BP 127/45  Pulse 95  Temp(Src) 98.4 F (36.9 C) (Oral)  Resp 22  Ht 6\' 2"  (1.88 m)  Wt 211 lb 3.2 oz (95.8 kg)  BMI 27.12 kg/m2  SpO2 98% gen = sedated, intubated HEENT=  ET tube in place pulm = mild scattered rhonchi Cors= nl s1,s2, no g/m/r Abd= soft, decreased BS, NTND Ext = trace edema Skin = warm to touch, moist Neuro = unable to assess due to sedation  Lab Results  Basename 05/24/11 0250 05/23/11 0537 05/22/11 0400  WBC 12.3* -- 7.3  HGB 9.5* -- 8.6*  HCT 29.3* -- 27.7*  NA 141 141 --  K 3.2* 3.0* --  CL 95* 95* --  CO2 37* 35* --  BUN 54* 65* --  CREATININE 1.47* 1.54* --  GLU -- -- --   Liver Panel  Basename 05/24/11 0250 05/23/11 0537  PROT -- --  ALBUMIN 2.6* 2.5*  AST -- --  ALT -- --  ALKPHOS -- --  BILITOT -- --  BILIDIR -- --  IBILI -- --   Microbiology: Recent Results (from the past 240 hour(s))  CULTURE, BLOOD (ROUTINE X 2)     Status: Normal (Preliminary result)   Collection Time   05/18/11  6:45 PM      Component Value Range Status Comment   Specimen Description BLOOD LEFT ARM   Final    Special Requests BOTTLES DRAWN AEROBIC ONLY 10CC   Final    Culture  Setup Time [id number]   Final    Culture     Final    Value:        BLOOD CULTURE RECEIVED NO GROWTH TO DATE CULTURE WILL BE HELD FOR 5 DAYS BEFORE ISSUING A FINAL NEGATIVE REPORT   Report Status PENDING   Incomplete   CULTURE, BLOOD (ROUTINE X 2)     Status: Normal (Preliminary result)   Collection Time   05/18/11  6:50 PM      Component Value Range Status Comment   Specimen Description BLOOD RIGHT HAND   Final    Special Requests BOTTLES DRAWN AEROBIC ONLY 10CC   Final    Culture  Setup Time [id number]   Final    Culture     Final    Value:        BLOOD CULTURE  RECEIVED NO GROWTH TO DATE CULTURE WILL BE HELD FOR 5 DAYS BEFORE ISSUING A FINAL NEGATIVE REPORT   Report Status PENDING   Incomplete   URINE CULTURE     Status: Normal   Collection Time   05/19/11 10:33 AM      Component Value Range Status Comment   Specimen Description URINE, CATHETERIZED   Final    Special Requests CONDOM   Final    Culture  Setup Time [id number]   Final    Colony Count 6,000 COLONIES/ML   Final    Culture [facility] MORGANII   Final    Report Status 05/22/2011 FINAL   Final    Organism ID, Bacteria MORGANELLA MORGANII   Final   CULTURE, SPUTUM-ASSESSMENT     Status: Normal   Collection Time   05/19/11 11:16 AM      Component Value Range Status Comment   Specimen Description SPUTUM   Final    Special Requests NONE   Final    Sputum evaluation     Final    Value: MICROSCOPIC FINDINGS SUGGEST THAT THIS SPECIMEN IS NOT REPRESENTATIVE OF LOWER RESPIRATORY SECRETIONS. PLEASE RECOLLECT.     Gram Stain Report Called to,Read Back By and Verified With: Wu RN 11:55 05/19/11 ([email or web address])   Report Status 05/19/2011 FINAL   Final   CULTURE, SPUTUM-ASSESSMENT     Status: Normal   Collection Time   05/19/11  5:52 PM      Component Value Range Status Comment   Specimen Description SPUTUM   Final    Special Requests Immunocompromised   Final    Sputum evaluation     Final    Value: MICROSCOPIC FINDINGS SUGGEST THAT THIS SPECIMEN IS NOT REPRESENTATIVE OF LOWER RESPIRATORY SECRETIONS. PLEASE RECOLLECT.     CALLED TO RN D.JOHNSON AT 2016 BY L.PITT 0/29/13   Report Status 05/19/2011 FINAL   Final   MRSA PCR SCREENING     Status: Normal   Collection Time   05/21/11  9:50 AM      Component Value Range Status Comment   MRSA by PCR NEGATIVE  NEGATIVE  Final     Studies/Results: Dg Chest Port 1  View  05/24/2011  *RADIOLOGY REPORT*  Clinical Data: Check ET tube position.  PORTABLE CHEST - 1 VIEW  Comparison: 05/22/2011  Findings: Interval placement of an endotracheal tube with tip about  5.7 cm above the carina.  Stable position of a left central venous catheter.  Enteric tube has been placed.  The tip is not visualized but is below the left hemidiaphragm consistent with location at least in the stomach.  Prominent heart size and pulmonary vascularity with diffuse airspace infiltrates in the lung suggesting edema or pneumonia.  IMPRESSION: Endotracheal tube tip is about 5.7 cm above the carina.  Persistent bilateral airspace infiltrates or edema.  Original Report Authenticated By: Marlon Pel, M.D.   Assessment/Plan: 71yo Male with ESDR s/p kidney txp (related donor, CMV status unk), IMS of tacro 5mg  BID, cellcept 500mg  BID, hydrocort 20mg  IV. Now with recent intubation, low-grade fever, and leukocytosis On vanco and piptazo for empiric coverage for HCAP vs. CHF exacerbation.   1) possible HCAP = sputum cultures negative. Currently on Day #4 of abtx of vanco and piptazo.  - He is supratherapeutic on vanco due to AKI.  - Will recheck random vanco tonite at 1800. Will not redose until vanco level <15.  -  Please recheck tracheal aspirate culture today due to recent intubation and increased leukocytosis and previous specimen was insufficient  2) fever = please repeat blood cx peripherally x 2, sputum cx, will send sputum for rvp testing as well  3) IMS = supratherapeutic tacro at 12. Likely will need decrease in dosage, will defer to renal   4) respiratory distress = will check BNP to see if last nights' decompensation is partially chf exacerbation  5) if no MRSA on repeat aspriate cx, then we will discontinue vancomycin.  6) if patient becomes hypotensive, would change piptazo to meropenem, renally dosed   LOS: 8 days    John [name] 05/24/2011, 9:41 AM

## 2011-05-24 NOTE — Progress Notes (Signed)
ANTIBIOTIC CONSULT NOTE - Follow-up  Pharmacy Consult for vancomycin Indication: rule out pneumonia  No Known Allergies  Patient Measurements: Height: 6\' 2"  (188 cm) Weight: 216 lb 11.4 oz (98.3 kg) (bed weight) IBW/kg (Calculated) : 82.2   Vital Signs: Temp: 98.4 F (36.9 C) (04/03 0000) Temp src: Axillary (04/03 0000) BP: 151/70 mmHg (04/03 0000) Pulse Rate: 91  (04/03 0000) Intake/Output from previous day: 04/02 0701 - 04/03 0700 In: 469 [I.V.:363; IV Piggyback:106] Out: 2020 [Urine:2020] Intake/Output from this shift: Total I/O In: 120 [I.V.:120] Out: 850 [Urine:850]  Labs:  [facility] 05/23/11 0537 05/22/11 0400 05/21/11 0612  WBC -- 7.3 9.3  HGB -- 8.6* 9.3*  PLT -- 174 178  LABCREA -- -- --  CREATININE 1.54* 1.46* 1.44*   Estimated Creatinine Clearance: 51.2 ml/min (by C-G formula based on Cr of 1.54).  Basename 05/23/11 2350 05/23/11 1000  VANCOTROUGH -- 27.9*  VANCOPEAK -- --  Drue Dun 26.3 --  GENTTROUGH -- --  GENTPEAK -- --  GENTRANDOM -- --  TOBRATROUGH -- --  TOBRAPEAK -- --  TOBRARND -- --  AMIKACINPEAK -- --  AMIKACINTROU -- --  AMIKACIN -- --     Microbiology: Recent Results (from the past 720 hour(s))  SURGICAL PCR SCREEN     Status: Normal   Collection Time   05/10/11  1:34 PM      Component Value Range Status Comment   MRSA, PCR NEGATIVE  NEGATIVE  Final    Staphylococcus aureus NEGATIVE  NEGATIVE  Final   CULTURE, BLOOD (ROUTINE X 2)     Status: Normal (Preliminary result)   Collection Time   05/18/11  6:45 PM      Component Value Range Status Comment   Specimen Description BLOOD LEFT ARM   Final    Special Requests BOTTLES DRAWN AEROBIC ONLY 10CC   Final    Culture  Setup Time [id number]   Final    Culture     Final    Value:        BLOOD CULTURE RECEIVED NO GROWTH TO DATE CULTURE WILL BE HELD FOR 5 DAYS BEFORE ISSUING A FINAL NEGATIVE REPORT   Report Status PENDING   Incomplete   CULTURE, BLOOD (ROUTINE X 2)     Status:  Normal (Preliminary result)   Collection Time   05/18/11  6:50 PM      Component Value Range Status Comment   Specimen Description BLOOD RIGHT HAND   Final    Special Requests BOTTLES DRAWN AEROBIC ONLY 10CC   Final    Culture  Setup Time [id number]   Final    Culture     Final    Value:        BLOOD CULTURE RECEIVED NO GROWTH TO DATE CULTURE WILL BE HELD FOR 5 DAYS BEFORE ISSUING A FINAL NEGATIVE REPORT   Report Status PENDING   Incomplete   URINE CULTURE     Status: Normal   Collection Time   05/19/11 10:33 AM      Component Value Range Status Comment   Specimen Description URINE, CATHETERIZED   Final    Special Requests CONDOM   Final    Culture  Setup Time [id number]   Final    Colony Count 6,000 COLONIES/ML   Final    Culture [facility] MORGANII   Final    Report Status 05/22/2011 FINAL   Final    Organism ID, Bacteria MORGANELLA MORGANII   Final   CULTURE, SPUTUM-ASSESSMENT  Status: Normal   Collection Time   05/19/11 11:16 AM      Component Value Range Status Comment   Specimen Description SPUTUM   Final    Special Requests NONE   Final    Sputum evaluation     Final    Value: MICROSCOPIC FINDINGS SUGGEST THAT THIS SPECIMEN IS NOT REPRESENTATIVE OF LOWER RESPIRATORY SECRETIONS. PLEASE RECOLLECT.     Gram Stain Report Called to,Read Back By and Verified With: Wu RN 11:55 05/19/11 ([email or web address])   Report Status 05/19/2011 FINAL   Final   CULTURE, SPUTUM-ASSESSMENT     Status: Normal   Collection Time   05/19/11  5:52 PM      Component Value Range Status Comment   Specimen Description SPUTUM   Final    Special Requests Immunocompromised   Final    Sputum evaluation     Final    Value: MICROSCOPIC FINDINGS SUGGEST THAT THIS SPECIMEN IS NOT REPRESENTATIVE OF LOWER RESPIRATORY SECRETIONS. PLEASE RECOLLECT.     CALLED TO RN D.JOHNSON AT 2016 BY L.PITT 0/29/13   Report Status 05/19/2011 FINAL   Final   MRSA PCR SCREENING     Status: Normal   Collection Time   05/21/11  9:50 AM        Component Value Range Status Comment   MRSA by PCR NEGATIVE  NEGATIVE  Final     Medical History: Past Medical History  Diagnosis Date  . CAD (coronary artery disease)     a. myoview 1/11: lg inf fixed defect;   b. cath 1/11: 3v CAD,  c. s/p CABG 1/11 c/b acute renal failure and AFib  . Atrial fibrillation   . Chronic diastolic heart failure     a. echo 4/11: EF 45-50%, mod LVH, LAE, inf and septal HK  . First degree AV block   . Orthostatic hypotension     pyridostigmine  . History of renal transplant   . DM2 (diabetes mellitus, type 2)   . Diabetic gastroparesis   . HTN (hypertension)   . HLD (hyperlipidemia)   . PAD (peripheral artery disease)     s/p R SFA-Pop atherectomy 11/11  . Prostate cancer   . ED (erectile dysfunction)   . DDD (degenerative disc disease), lumbar   . Tremor   . Obesity   . Low testosterone   . Chronic kidney disease     s/p renal transplant 2002  . Pleural effusion, right     loculated  . COPD (chronic obstructive pulmonary disease)   . Anxiety   . Diabetes mellitus     insulin pump    Medications:  Anti-infectives     Start     Dose/Rate Route Frequency Ordered Stop   05/21/11 1000   piperacillin-tazobactam (ZOSYN) IVPB 3.375 g        3.375 g 12.5 mL/hr over 240 Minutes Intravenous Every 8 hours 05/21/11 0855     05/21/11 1000   vancomycin (VANCOCIN) IVPB 1000 mg/200 mL premix  Status:  Discontinued        1,000 mg 200 mL/hr over 60 Minutes Intravenous Every 12 hours 05/21/11 0855 05/23/11 1101   05/16/11 0932   bacitracin 50,000 Units in sodium chloride irrigation 0.9 % 500 mL irrigation  Status:  Discontinued          As needed 05/16/11 0932 05/16/11 1055   05/16/11 0700   bacitracin 96045 UNITS injection     Comments: Imagene Gurney: cabinet override  05/16/11 0700 05/16/11 1914   05/16/11 0000   ceFAZolin (ANCEF) IVPB 2 g/50 mL premix        2 g 100 mL/hr over 30 Minutes Intravenous 60 min pre-op 05/15/11 1449  05/16/11 0828         Assessment: 70 yom admitted for elective back surgery, now s/p laminectomy and decompression of L5 S1 on 3/26, then with worsening confusion to start on empiric vancomycin + zosyn for possible pneumonia.   Patient was previously on vancomycin 1gm IV Q12H with last complete dose given 05/22/11 at 21:00.  05/23/11 AM vancomycin trough was above-goal at 27.9 mcg/mL. Patient received 1/2 bag of vancomycin before level came back, and it was stopped at that time.  05/23/11 PM vancomycin level above-goal at 26.3 mcg/mL.   Goal of Therapy:  Vancomycin trough level 15-20 mcg/ml  Plan:  1. Vancomycin 1250 gm IV Q24H starting 05/24/11 at 18:00.   Emeline Gins 05/24/2011,1:12 AM

## 2011-05-24 NOTE — Progress Notes (Signed)
Pt has settled down and is much more calm and relaxed after seroquel and ativan dose; sitter remains at bedside; will continue to monitor closely and update as needed

## 2011-05-24 NOTE — Progress Notes (Signed)
[facility] Progress Note   Assessment & Plan  1. CKD / renal transplant 2002 / Cr 1.1-1.3. Home regimen includes Prograf 5mg  bid, CellCept 500mg  bid and prednisone 5 mg/d . Renal function worsening, creat up 1.0 > 1.47 in hospital. UOP improved; he was taking Furosemide 80am and 40pm at home. Tacrolimus level is pending still!  2. Acute diastolic congestive heart failure/ Pneumonia with respiratory failure-reintubated this morning 3. Hypokalemic alkalosis due to diuretics--  K replace, reduce furosemide  Subjective:   Intubated/sedated   Objective:   BP 122/49  Pulse 88  Temp(Src) 98.4 F (36.9 C) (Oral)  Resp 18  Ht 6\' 2"  (1.88 m)  Wt 95.8 kg (211 lb 3.2 oz)  BMI 27.12 kg/m2  SpO2 100%  Intake/Output Summary (Last 24 hours) at 05/24/11 1012 Last data filed at 05/24/11 0800  Gross per 24 hour  Intake 975.72 ml  Output   2695 ml  Net -1719.28 ml   Weight change: -2.5 kg (-5 lb 8.2 oz)  Physical Exam: AVW:[id number] CVS:RRR Resp:clear WGN:FAOZ Ext:1+ edema Sedated  Imaging: Dg Chest Port 1 View  05/24/2011  *RADIOLOGY REPORT*  Clinical Data: Check ET tube position.  PORTABLE CHEST - 1 VIEW  Comparison: 05/22/2011  Findings: Interval placement of an endotracheal tube with tip about 5.7 cm above the carina.  Stable position of a left central venous catheter.  Enteric tube has been placed.  The tip is not visualized but is below the left hemidiaphragm consistent with location at least in the stomach.  Prominent heart size and pulmonary vascularity with diffuse airspace infiltrates in the lung suggesting edema or pneumonia.  IMPRESSION: Endotracheal tube tip is about 5.7 cm above the carina.  Persistent bilateral airspace infiltrates or edema.  Original Report Authenticated By: Marlon Pel, M.D.    Labs: BMET  Lab 05/24/11 0250 05/23/11 3086 05/22/11 0400 05/21/11 0612 05/20/11 0630 05/19/11 1050 05/19/11 0640  NA 141 141 141 138 136 135 137  K 3.2*  3.0* 3.9 4.9 4.8 4.5 4.6  CL 95* 95* 99 97 99 97 100  CO2 37* 35* 34* 31 31 29 29   GLUCOSE 129* 194* 222* 198* 183* 113* 92  BUN 54* 65* 63* 50* 37* 29* 29*  CREATININE 1.47* 1.54* 1.46* 1.44* 1.36* 1.33 1.43*  ALB -- -- -- -- -- -- --  CALCIUM 9.9 10.0 10.1 10.1 9.6 9.6 9.7  PHOS 2.4 3.3 -- 3.9 -- -- --   CBC  Lab 05/24/11 0250 05/22/11 0400 05/21/11 0612 05/20/11 0630  WBC 12.3* 7.3 9.3 10.2  NEUTROABS -- -- -- 8.0*  HGB 9.5* 8.6* 9.3* 8.8*  HCT 29.3* 27.7* 30.6* 28.1*  MCV 68.3* 69.9* 70.7* 71.0*  PLT 230 174 178 151    Medications:      . antiseptic oral rinse  1 application Mouth Rinse QID  . aspirin EC  81 mg Oral Daily  . calcium carbonate  2 tablet Oral Daily  . carbidopa-levodopa  1 tablet Oral TID  . chlorhexidine  15 mL Mouth/Throat BID  . docusate sodium  100 mg Oral BID  . etomidate      . furosemide  60 mg Intravenous TID  . hydrocortisone sod succinate (SOLU-CORTEF) injection  20 mg Intravenous Daily  . insulin aspart  0-20 Units Subcutaneous Q4H  . insulin glargine  12 Units Subcutaneous Daily  . ipratropium  0.5 mg Nebulization QID  . lamoTRIgine  200 mg Oral Daily  . levalbuterol  0.63 mg Nebulization  QID  . lidocaine (cardiac) 100 mg/41ml      . linagliptin  5 mg Oral Daily  . mycophenolate  500 mg Oral BID  . pantoprazole (PROTONIX) IV  40 mg Intravenous Q24H  . piperacillin-tazobactam (ZOSYN)  IV  3.375 g Intravenous Q8H  . potassium chloride  10 mEq Intravenous Q1 Hr x 4  . potassium chloride  10 mEq Intravenous Q1 Hr x 4  . propofol      . pyridostigmine  60 mg Oral TID  . QUEtiapine  50 mg Oral Once  . rocuronium      . senna  1 tablet Oral BID  . sodium chloride  3 mL Intravenous Q12H  . succinylcholine      . tacrolimus  5 mg Oral BID  . Tamsulosin HCl  0.4 mg Oral Daily  . DISCONTD: pantoprazole  40 mg Oral BID  . DISCONTD: risperiDONE  1 mg Oral QHS  . DISCONTD: vancomycin  1,250 mg Intravenous Q24H  . DISCONTD: vancomycin  1,000 mg  Intravenous Q12H

## 2011-05-24 NOTE — Progress Notes (Signed)
CSW met with pt's wife and daughters at bedside. Pt ws intubated earlier this morning. CSW provided psychosocial support to pt's family. CSW will address discharge needs as needed. CSW will continue to follow. Please call if an urgent need arises.   Dede Query, MSW, Theresia Majors [phone]

## 2011-05-24 NOTE — Progress Notes (Signed)
Patient ID: John [name], male   DOB: 07-01-1940, 71 y.o.   MRN: [id number] Patient is intubated sedated on ventilator. Status post respiratory decompensation. She has been complaining of substantial back pain. Currently on antibiotics for presumed pneumonia.  I question whether the patient may have an evolving discitis as an etiology for his severe and unrelenting back pain. If he is currently on vancomycin this would cover most likely source for discitis. Will follow with you.

## 2011-05-25 ENCOUNTER — Inpatient Hospital Stay (HOSPITAL_COMMUNITY): Payer: Medicare Other

## 2011-05-25 DIAGNOSIS — J9600 Acute respiratory failure, unspecified whether with hypoxia or hypercapnia: Secondary | ICD-10-CM

## 2011-05-25 DIAGNOSIS — R509 Fever, unspecified: Secondary | ICD-10-CM

## 2011-05-25 LAB — BASIC METABOLIC PANEL
CO2: 32 mEq/L (ref 19–32)
Calcium: 9.8 mg/dL (ref 8.4–10.5)
Creatinine, Ser: 1.37 mg/dL — ABNORMAL HIGH (ref 0.50–1.35)
GFR calc Af Amer: 55 mL/min — ABNORMAL LOW (ref >90)
GFR calc non Af Amer: 48 mL/min — ABNORMAL LOW (ref >90)
Glucose, Bld: 180 mg/dL — ABNORMAL HIGH (ref 70–99)
Potassium: 3.2 mEq/L — ABNORMAL LOW (ref 3.5–5.1)
Sodium: 143 mEq/L (ref 135–145)

## 2011-05-25 LAB — BLOOD GAS, ARTERIAL
Acid-Base Excess: 9.5 mmol/L — ABNORMAL HIGH (ref 0.0–2.0)
Bicarbonate: 33.4 mEq/L — ABNORMAL HIGH (ref 20.0–24.0)
Drawn by: 29017
FIO2: 0.4 %
RATE: 12 resp/min
TCO2: 34.8 mmol/L (ref 0–100)
pCO2 arterial: 44.9 mmHg (ref 35.0–45.0)
pO2, Arterial: 118 mmHg — ABNORMAL HIGH (ref 80.0–100.0)

## 2011-05-25 LAB — CBC
MCHC: 31 g/dL (ref 30.0–36.0)
Platelets: 259 10*3/uL (ref 150–400)
RDW: 18.9 % — ABNORMAL HIGH (ref 11.5–15.5)

## 2011-05-25 LAB — CULTURE, BLOOD (ROUTINE X 2): Culture  Setup Time: 201303290140

## 2011-05-25 LAB — GLUCOSE, CAPILLARY
Glucose-Capillary: 143 mg/dL — ABNORMAL HIGH (ref 70–99)
Glucose-Capillary: 154 mg/dL — ABNORMAL HIGH (ref 70–99)
Glucose-Capillary: 164 mg/dL — ABNORMAL HIGH (ref 70–99)
Glucose-Capillary: 183 mg/dL — ABNORMAL HIGH (ref 70–99)
Glucose-Capillary: 203 mg/dL — ABNORMAL HIGH (ref 70–99)

## 2011-05-25 LAB — MAGNESIUM: Magnesium: 2.2 mg/dL (ref 1.5–2.5)

## 2011-05-25 LAB — TACROLIMUS LEVEL: Tacrolimus (FK506) - LabCorp: 5.3 ng/mL

## 2011-05-25 LAB — PHOSPHORUS: Phosphorus: 2.8 mg/dL (ref 2.3–4.6)

## 2011-05-25 MED ORDER — PYRIDOSTIGMINE BROMIDE 60 MG PO TABS
30.0000 mg | ORAL_TABLET | Freq: Three times a day (TID) | ORAL | Status: DC
  Administered 2011-05-25 – 2011-05-28 (×11): 30 mg via ORAL
  Administered 2011-05-29 (×2): via ORAL
  Administered 2011-05-29: 30 mg via ORAL
  Administered 2011-05-30 (×2): via ORAL
  Administered 2011-05-30 – 2011-06-08 (×22): 30 mg via ORAL
  Filled 2011-05-25 (×49): qty 0.5

## 2011-05-25 MED ORDER — POTASSIUM CHLORIDE 20 MEQ/15ML (10%) PO LIQD
ORAL | Status: AC
  Administered 2011-05-25: 20 meq
  Filled 2011-05-25: qty 15

## 2011-05-25 MED ORDER — POTASSIUM CHLORIDE 20 MEQ/15ML (10%) PO LIQD
40.0000 meq | Freq: Three times a day (TID) | ORAL | Status: AC
  Administered 2011-05-25: 40 meq
  Administered 2011-05-25 (×2): 20 meq
  Filled 2011-05-25 (×3): qty 30

## 2011-05-25 MED ORDER — POTASSIUM CHLORIDE 20 MEQ/15ML (10%) PO LIQD
ORAL | Status: AC
  Filled 2011-05-25: qty 30

## 2011-05-25 MED ORDER — POTASSIUM CHLORIDE 20 MEQ/15ML (10%) PO LIQD
ORAL | Status: AC
  Administered 2011-05-25: 40 meq
  Filled 2011-05-25: qty 30

## 2011-05-25 MED ORDER — OSMOLITE 1.2 CAL PO LIQD
1000.0000 mL | ORAL | Status: DC
  Administered 2011-05-25 – 2011-05-28 (×5): 1000 mL
  Filled 2011-05-25 (×9): qty 1000

## 2011-05-25 MED ORDER — SODIUM CHLORIDE 0.9 % IV SOLN
INTRAVENOUS | Status: DC
  Administered 2011-05-26: 5 mL/h via INTRAVENOUS
  Administered 2011-05-27: 1000 mL via INTRAVENOUS

## 2011-05-25 MED ORDER — FUROSEMIDE 10 MG/ML IJ SOLN
40.0000 mg | Freq: Three times a day (TID) | INTRAMUSCULAR | Status: DC
  Administered 2011-05-25 – 2011-05-26 (×4): 40 mg via INTRAVENOUS
  Filled 2011-05-25 (×7): qty 4

## 2011-05-25 MED ORDER — SODIUM CHLORIDE 0.9 % IV SOLN
1.0000 mg/h | INTRAVENOUS | Status: DC
  Administered 2011-05-25: 3 mg/h via INTRAVENOUS
  Administered 2011-05-25: 8 mg/h via INTRAVENOUS
  Administered 2011-05-26: 5 mg/h via INTRAVENOUS
  Administered 2011-05-26: 7 mg/h via INTRAVENOUS
  Administered 2011-05-27: 6 mg/h via INTRAVENOUS
  Administered 2011-05-27: 7 mg/h via INTRAVENOUS
  Administered 2011-05-28: 2 mg/h via INTRAVENOUS
  Administered 2011-05-28 (×2): 7 mg/h via INTRAVENOUS
  Administered 2011-05-28: 9 mg/h via INTRAVENOUS
  Administered 2011-05-28 (×2): 7 mg/h via INTRAVENOUS
  Administered 2011-05-28 – 2011-05-29 (×2): 9 mg/h via INTRAVENOUS
  Administered 2011-05-30 (×2): 5 mg/h via INTRAVENOUS
  Filled 2011-05-25 (×15): qty 10

## 2011-05-25 MED ORDER — PRO-STAT SUGAR FREE PO LIQD
30.0000 mL | Freq: Three times a day (TID) | ORAL | Status: DC
  Administered 2011-05-25 – 2011-05-28 (×12): 30 mL
  Filled 2011-05-25 (×15): qty 30

## 2011-05-25 MED ORDER — POTASSIUM CHLORIDE 20 MEQ/15ML (10%) PO LIQD
40.0000 meq | Freq: Once | ORAL | Status: AC
  Administered 2011-05-25: 40 meq
  Filled 2011-05-25: qty 30

## 2011-05-25 NOTE — Progress Notes (Addendum)
HPI: 71 yo male with hx COPD, renal transplant (2002), DM on insulin pump who presented 3/26 for lumbar laminotomies and decompression L5 and S1 r/t spondylosis and herniated nucleus pulposus. Post op developed episodes of confusion and respiratory distress thought r/t CHF Which initially improved with lasix. On 3/31 developed worsening respiratory distress and hypoxia and PCCM consulted.   Antibiotics:  Vanc 3/31>>>  Zosyn 3/31>>>   Cultures/Sepsis Markers:   Urine 3/29>>> MORGANELLA MORGANII (6,000 COLONIES/ML)  BCx2 3/28>>> negative  Sputum 3/31>>>  Pct 3/31>>>   Access/Protocols:  L IJ CVL>> 3/31  ET tube>> 4/3  Best Practice:  DVT: SCD  GI: Pantoprazole, Colace   Subjective: Patient laying in bed, NAD, no new complaints.   Physical Exam: Filed Vitals:   05/25/11 0842  BP:   Pulse:   Temp: 99.3 F (37.4 C)  Resp:     Intake/Output Summary (Last 24 hours) at 05/25/11 0905 Last data filed at 05/25/11 0800  Gross per 24 hour  Intake 2392.1 ml  Output   2975 ml  Net -582.9 ml   Vent Mode:  [-] CPAP FiO2 (%):  [40 %] 40 % Set Rate:  [12 bmp] 12 bmp Vt Set:  [580 mL] 580 mL PEEP:  [5 cmH20] 5 cmH20 Pressure Support:  [12 cmH20] 12 cmH20 Plateau Pressure:  [19 cmH20-23 cmH20] 19 cmH20  General: chronically ill appearing male, AOx1. Neuro: awake, confused, MAE, gen weakness.  CV: irregularly irregular, no m/r/g.  PULM: resps even, mildly labored, diminished throughout with few scattered ronchi.  GI: abd soft, +bs.  Extremities: Warm and dry, trace BLE edema.   Labs: CBC:    Component Value Date/Time   WBC 15.1* 05/25/2011 0305   HGB 9.9* 05/25/2011 0305   HCT 31.9* 05/25/2011 0305   PLT 259 05/25/2011 0305   MCV 69.2* 05/25/2011 0305   NEUTROABS 9.1* 05/24/2011 0250   LYMPHSABS 1.7 05/24/2011 0250   MONOABS 1.3* 05/24/2011 0250   EOSABS 0.5 05/24/2011 0250   BASOSABS 0.0 05/24/2011 0250   Basic Metabolic Panel:    Component Value Date/Time   NA 143 05/25/2011 0305   K 3.2* 05/25/2011 0305   CL 99 05/25/2011 0305   CO2 35* 05/25/2011 0305   BUN 46* 05/25/2011 0305   CREATININE 1.43* 05/25/2011 0305   GLUCOSE 154* 05/25/2011 0305   CALCIUM 9.8 05/25/2011 0305   Comprehensive Metabolic Panel:    Component Value Date/Time   NA 143 05/25/2011 0305   K 3.2* 05/25/2011 0305   CL 99 05/25/2011 0305   CO2 35* 05/25/2011 0305   BUN 46* 05/25/2011 0305   CREATININE 1.43* 05/25/2011 0305   GLUCOSE 154* 05/25/2011 0305   CALCIUM 9.8 05/25/2011 0305   AST 16 05/20/2011 0836   ALT 6 05/20/2011 0836   ALKPHOS 121* 05/20/2011 0836   BILITOT 0.8 05/20/2011 0836   PROT 6.3 05/20/2011 0836   ALBUMIN 2.6* 05/24/2011 0250   ABG    Component Value Date/Time   PHART 7.485* 05/25/2011 0330   PCO2ART 44.9 05/25/2011 0330   PO2ART 118.0* 05/25/2011 0330   HCO3 33.4* 05/25/2011 0330   TCO2 34.8 05/25/2011 0330   ACIDBASEDEF 1.0 04/22/2009 0905   O2SAT 99.0 05/25/2011 0330    Lab 05/25/11 0305  MG 2.2   Lab Results  Component Value Date   CALCIUM 9.8 05/25/2011   PHOS 2.8 05/25/2011    Assessment & Plan:  Acute respiratory failure  Likely multifactorial in setting volume overload with underlying COPD +/-  HCAP post lumbar surgery in immunosuppressed patient. Patient is afebrile without WBC elevation. CXR shows diffuse edema without clear focal infiltrate. Overnight patient experienced respiratory distress and had to be intubated. Currently on full vent support.  - Lasix at 40 mg IV q8 hours.  - PS trials today. - Appreciate ID recs, will continue empiric abx for ?HCAP with vanc and zosyn until further cultures return, and pan-culture of spikes fever.   Hypokalemia Replete with IV KCL  DM  home on insulin pump.  - D/C insulin pump while critically ill.  - Lantus and SSI.  HTN  - Cont diuresis.  Chronic renal insufficiency s/p renal transplant 2002  Renal following.  - Cont cellcept, prograf  - Diuresis as above - Tacrolimus level is 12.3, per Renal skipped PM Tacrolimus dose on 4/3, rechecking  level again today. Defer to renal for further adjustment.   Best practices / Disposition  -->ICU status under PCCM  -->full code  -->SCD's for DVT Px  -->Protonix and Colace for GI Px   TOBBIA,PATRICK, MD  Discussed with family, wish to continue full support for now until diureses is complete.  CC time 35 minutes.  Patient seen and examined, agree with above note.  I dictated the care and orders written for this patient under my direction.  Koren Bound, M.D. [phone]

## 2011-05-25 NOTE — Clinical Documentation Improvement (Signed)
CHANGE MENTAL STATUS DOCUMENTATION CLARIFICATION   THIS DOCUMENT IS NOT A PERMANENT PART OF THE MEDICAL RECORD  TO RESPOND TO THE THIS QUERY, FOLLOW THE INSTRUCTIONS BELOW:  1. If needed, update documentation for the patient's encounter via the notes activity.  2. Access this query again and click edit on the In [employer].  3. After updating, or not, click F2 to complete all highlighted (required) fields concerning your review. Select "additional documentation in the medical record" OR "no additional documentation provided".  4. Click Sign note button.  5. The deficiency will fall out of your In Basket *Please let us know if you are not able to complete this workflow by phone or e-mail (listed below).         05/25/11  Dear Dr. Kendrick Fries Marton Redwood  In an effort to better capture your patient's severity of illness, reflect appropriate length of stay and utilization of resources, a review of the patient medical record has revealed the following indicators.   Based on your clinical judgment, please clarify and document in a progress note and/or discharge summary the clinical condition associated with the following supporting information: In responding to this query please exercise your independent judgment.  The fact that a query is asked, does not imply that any particular answer is desired or expected.   "delirium" is documented in the progress notes 4/2, 4/3. Please consider additional specificity .Marland Kitchen.. please document the patient's diagnosis/condition(s) in the progress note and discharge summary. Thank you!  Possible Clinical Conditions?  - Acute ICU Delirium  - Acute _______Encephalopathy (describe type if known - metabolic, anoxic etc.)                      - Other condition (please document in the progress notes and/or discharge summary)  - Cannot Clinically determine at this time   Supporting Information:  - Other diagnosis: Respiratory Alkalosis, possible HCAP,  hypokalemia, anemia, acute respiratory failure, acute on chronic CHF, pulmonary edema, Afib, Gold Sage IV COPD, ESRD s/p renal transplant.    Reviewed:  no additional documentation provided  Thank You,  Saul Fordyce  Clinical Documentation Specialist: [phone] Pager  Health Information Management [place]

## 2011-05-25 NOTE — Progress Notes (Signed)
INITIAL ADULT NUTRITION ASSESSMENT Date: 05/25/2011   Time: 9:20 AM Reason for Assessment: Consult, Vent x 1 day  ASSESSMENT: Male 71 y.o.  Dx: Herniated nucleus pulposus  Hx:  Past Medical History  Diagnosis Date  . CAD (coronary artery disease)     a. myoview 1/11: lg inf fixed defect;   b. cath 1/11: 3v CAD,  c. s/p CABG 1/11 c/b acute renal failure and AFib  . Atrial fibrillation   . Chronic diastolic heart failure     a. echo 4/11: EF 45-50%, mod LVH, LAE, inf and septal HK  . First degree AV block   . Orthostatic hypotension     pyridostigmine  . History of renal transplant   . DM2 (diabetes mellitus, type 2)   . Diabetic gastroparesis   . HTN (hypertension)   . HLD (hyperlipidemia)   . PAD (peripheral artery disease)     s/p R SFA-Pop atherectomy 11/11  . Prostate cancer   . ED (erectile dysfunction)   . DDD (degenerative disc disease), lumbar   . Tremor   . Obesity   . Low testosterone   . Chronic kidney disease     s/p renal transplant 2002  . Pleural effusion, right     loculated  . COPD (chronic obstructive pulmonary disease)   . Anxiety   . Diabetes mellitus     insulin pump   Past Surgical History  Procedure Date  . Coronary artery bypass graft 03/2009  . Cataract extraction   . Vitrectomy   . Tonsillectomy   . Kidney transplant 2002  . Hernia repair   . Prostate cancer rx 2006 brachytherapy   . Prior peritoneal catheter placement   . Lumbar laminectomy/decompression microdiscectomy 05/16/2011    Procedure: LUMBAR LAMINECTOMY/DECOMPRESSION MICRODISCECTOMY 1 LEVEL;  Surgeon: Barnett Abu, MD;  Location: [facility];  Service: Neurosurgery;  Laterality: Bilateral;  Bilateral Lumbar Five-Sacral One Laminectomy    Related Meds:     . antiseptic oral rinse  1 application Mouth Rinse QID  . aspirin  81 mg Per Tube Daily  . calcium carbonate  2 tablet Oral Daily  . carbidopa-levodopa  1 tablet Oral TID  . chlorhexidine  15 mL Mouth/Throat BID  .  docusate  100 mg Per Tube BID  . furosemide  40 mg Intravenous Q8H  . hydrocortisone sod succinate (SOLU-CORTEF) injection  20 mg Intravenous Daily  . insulin aspart  0-20 Units Subcutaneous Q4H  . insulin glargine  12 Units Subcutaneous Daily  . ipratropium  0.5 mg Nebulization QID  . lamoTRIgine  200 mg Oral Daily  . levalbuterol  0.63 mg Nebulization QID  . lidocaine (cardiac) 100 mg/73ml      . linagliptin  5 mg Oral Daily  . mycophenolate (CELLCEPT) IVPB  500 mg Intravenous Q12H  . pantoprazole (PROTONIX) IV  40 mg Intravenous Q24H  . piperacillin-tazobactam (ZOSYN)  IV  3.375 g Intravenous Q8H  . potassium chloride  10 mEq Intravenous Q1 Hr x 4  . potassium chloride  10 mEq Intravenous Q1 Hr x 3  . potassium chloride  40 mEq Per Tube Once  . pyridostigmine  30 mg Oral TID  . rocuronium      . senna  1 tablet Oral BID  . sodium chloride  3 mL Intravenous Q12H  . succinylcholine      . tacrolimus  5 mg Oral BID  . Tamsulosin HCl  0.4 mg Oral Daily  . vancomycin  1,250 mg Intravenous Q24H  .  DISCONTD: aspirin EC  81 mg Oral Daily  . DISCONTD: docusate sodium  100 mg Oral BID  . DISCONTD: furosemide  40 mg Intravenous Q12H  . DISCONTD: furosemide  60 mg Intravenous TID  . DISCONTD: mycophenolate  500 mg Oral BID  . DISCONTD: mycophenolate  0.5 g Intravenous BID  . DISCONTD: potassium chloride  10 mEq Intravenous Q1 Hr x 4  . DISCONTD: potassium chloride  10 mEq Intravenous Q1 Hr x 2  . DISCONTD: pyridostigmine  60 mg Oral TID  . DISCONTD: tacrolimus  5 mg Oral BID  . DISCONTD: vancomycin  1,250 mg Intravenous Q24H     Ht: 6\' 2"  (188 cm)  Wt: 209 lb 10.5 oz (95.1 kg) (bed weight)  Ideal Wt: 86.3 kg % Ideal Wt: 110%  Usual Wt: 230 lbs Admission weight 05/16/11 232 lbs  Wt Readings from Last 3 Encounters:  05/25/11 209 lb 10.5 oz (95.1 kg)  05/25/11 209 lb 10.5 oz (95.1 kg)  05/10/11 230 lb 2.6 oz (104.4 kg)  04/21/11  227 lbs 04/17/11  224 lbs 03/15/11  223 lbs % Usual  Wt: 91%  Body mass index is 26.92 kg/(m^2). Overweight  Food/Nutrition Related Hx: No problems indicated per nutrition risk screen  Labs:  CMP     Component Value Date/Time   NA 143 05/25/2011 0305   K 3.2* 05/25/2011 0305   CL 99 05/25/2011 0305   CO2 35* 05/25/2011 0305   GLUCOSE 154* 05/25/2011 0305   BUN 46* 05/25/2011 0305   CREATININE 1.43* 05/25/2011 0305   CALCIUM 9.8 05/25/2011 0305   PROT 6.3 05/20/2011 0836   ALBUMIN 2.6* 05/24/2011 0250   AST 16 05/20/2011 0836   ALT 6 05/20/2011 0836   ALKPHOS 121* 05/20/2011 0836   BILITOT 0.8 05/20/2011 0836   GFRNONAA 48* 05/25/2011 0305   GFRAA 55* 05/25/2011 0305   Phos and Magnesium on 4/2and 4/4  were both WNL CBG (last 3)   Basename 05/25/11 0844 05/25/11 0316 05/24/11 2328  GLUCAP 143* 154* 132*      Intake/Output Summary (Last 24 hours) at 05/25/11 0927 Last data filed at 05/25/11 0900  Gross per 24 hour  Intake 2412.1 ml  Output   3225 ml  Net -812.9 ml     Diet Order: Carb Control- on vent not able to eat  Supplements/Tube Feeding: none  IVF:    sodium chloride Last Rate: 250 mL (05/25/11 0700)  fentaNYL infusion INTRAVENOUS Last Rate: 50 mcg/hr (05/25/11 0800)  propofol Last Rate: 20 mcg/kg/min (05/25/11 0800)    Estimated Nutritional Needs:   Kcal: 2164  Protein: 130-140 gm Fluid: >2.1 L  Pt admitted for elective surgery on back. Post op pt with confusion and respiratory distress. Pt with hx of CHF, started on lasix and initially improved. Respiratory distress worsened ultimately leading to him being intubated on 4/3.  Pt with weight loss during this admission, some from fluid losses with diuretics for CHF. Still with pulmonary edema per MD notes, likely will need to reduce fluid from lungs before being able to be extubated.  Pt s/p kidney transplant. Nephrology following.  Last PO intake was 4/1 lunch, 15%. Prior to that intake was less then 50%. Pt with 23 lbs weight loss since admission, though net loss of 5.6 L of  fluid since admit. Pt has had additional weight loss besides fluid that is significant. Pt meets criteria for severe malnutrition in the context of acute illness or injury 2/2 to weight loss and  intake less then 50% for greater than 5 days. Propofol rate is currently at 0 ml. RD will continue to monitor and make adjustments to TF if propofol is increased.  NUTRITION DIAGNOSIS: -Inadequate oral intake (NI-2.1).  Status: Ongoing  RELATED TO: inability to eat  AS EVIDENCE BY: vent, NPO  MONITORING/EVALUATION(Goals): Goal: EN to provide >90% of estimated nutrition needs while pt is unable to eat PO Monitor: Vent/extuabation, weight, labs, I/O's  EDUCATION NEEDS: -No education needs identified at this time  INTERVENTION: 1. Start Osmolite 1.2 via OG tube. Start at 15 ml/hr and advance by 10 ml q 4 hours as tolerated to a goal rate of 65 ml/hr.  2. Provide 30 ml Pro-stat TID via OG tube.  3. This will provide 2088 kcal, 132 gm protein, and 1279 ml free water. Additional water is being provided to meet hydration needs via IV.   Dietitian [phone]  DOCUMENTATION CODES Per approved criteria  -Severe malnutrition in the context of acute illness or injury    KOWALSKI, Donnalee Cellucci MARIE 05/25/2011, 9:20 AM

## 2011-05-25 NOTE — Progress Notes (Signed)
Pt placed on wean 12/5 and 40%. Pt did not tolerate 5/5 or 10/5. PT had extremely low tidal volumes. Pt tolerating 12/5 and resting comfortably. RT will try to reduce PS with more wake up time. RT will continue to monitor.

## 2011-05-25 NOTE — Progress Notes (Signed)
[facility] Progress Note   Plan  1. CKD / renal transplant 2002 / Cr 1.1-1.3. Home regimen includes Prograf 5mg  bid, CellCept 500mg  bid and prednisone 5 mg/d . Renal function worsening, creat up 1.0 > 1.43 in hospital. UOP improved; he was taking Furosemide 80am and 40pm at home. Tacrolimus level is reportedly high..I can't find results.  Recheck ordered. 2. Acute diastolic congestive heart failure/ Pneumonia with respiratory failure-reintubated this morning 3. Hypokalemic alkalosis due to diuretics-- cont. To K replace Subjective:   Intubated/ sedated   Objective:   BP 118/69  Pulse 91  Temp(Src) 99.3 F (37.4 C) (Oral)  Resp 14  Ht 6\' 2"  (1.88 m)  Wt 95.1 kg (209 lb 10.5 oz)  BMI 26.92 kg/m2  SpO2 100%  Intake/Output Summary (Last 24 hours) at 05/25/11 0948 Last data filed at 05/25/11 0900  Gross per 24 hour  Intake 2310.7 ml  Output   3225 ml  Net -914.3 ml   Weight change: -0.7 kg (-1 lb 8.7 oz)  Physical Exam: ZOX:[id number] on T tube trial CVS:RRR Resp:coarse and clear WJX:BJYN Ext:1+ edema  Imaging: Dg Chest Port 1 View  05/25/2011  *RADIOLOGY REPORT*  Clinical Data: ET tube placement, shortness of breath  PORTABLE CHEST - 1 VIEW  Comparison: May 24, 2011  Findings: The ET tube tip remains well above the carina.  The left IJ central line tip is stable at the SVC level.  The nasogastric tube courses off the inferior film.  Pulmonary edema persists and does not appear significantly changed.  There are small bilateral pleural effusions, also stable appearing.  IMPRESSION: Stable supportive devices.  No significant change in appearance of pulmonary edema and small bilateral effusions.  Original Report Authenticated By: Brandon Melnick, M.D.   Dg Chest Port 1 View  05/24/2011  *RADIOLOGY REPORT*  Clinical Data: Check ET tube position.  PORTABLE CHEST - 1 VIEW  Comparison: 05/22/2011  Findings: Interval placement of an endotracheal tube with tip about 5.7 cm  above the carina.  Stable position of a left central venous catheter.  Enteric tube has been placed.  The tip is not visualized but is below the left hemidiaphragm consistent with location at least in the stomach.  Prominent heart size and pulmonary vascularity with diffuse airspace infiltrates in the lung suggesting edema or pneumonia.  IMPRESSION: Endotracheal tube tip is about 5.7 cm above the carina.  Persistent bilateral airspace infiltrates or edema.  Original Report Authenticated By: Marlon Pel, M.D.    Labs: BMET  Lab 05/25/11 8295 05/24/11 0250 05/23/11 0537 05/22/11 0400 05/21/11 0612 05/20/11 0630 05/19/11 1050  NA 143 141 141 141 138 136 135  K 3.2* 3.2* 3.0* 3.9 4.9 4.8 4.5  CL 99 95* 95* 99 97 99 97  CO2 35* 37* 35* 34* 31 31 29   GLUCOSE 154* 129* 194* 222* 198* 183* 113*  BUN 46* 54* 65* 63* 50* 37* 29*  CREATININE 1.43* 1.47* 1.54* 1.46* 1.44* 1.36* 1.33  ALB -- -- -- -- -- -- --  CALCIUM 9.8 9.9 10.0 10.1 10.1 9.6 9.6  PHOS 2.8 2.4 3.3 -- 3.9 -- --   CBC  Lab 05/25/11 0305 05/24/11 0250 05/22/11 0400 05/21/11 0612 05/20/11 0630  WBC 15.1* 12.3* 7.3 9.3 --  NEUTROABS -- 9.1* -- -- 8.0*  HGB 9.9* 9.5* 8.6* 9.3* --  HCT 31.9* 29.3* 27.7* 30.6* --  MCV 69.2* 68.3* 69.9* 70.7* --  PLT 259 230 174 178 --  Medications:      . antiseptic oral rinse  1 application Mouth Rinse QID  . aspirin  81 mg Per Tube Daily  . calcium carbonate  2 tablet Oral Daily  . carbidopa-levodopa  1 tablet Oral TID  . chlorhexidine  15 mL Mouth/Throat BID  . docusate  100 mg Per Tube BID  . furosemide  40 mg Intravenous Q8H  . hydrocortisone sod succinate (SOLU-CORTEF) injection  20 mg Intravenous Daily  . insulin aspart  0-20 Units Subcutaneous Q4H  . insulin glargine  12 Units Subcutaneous Daily  . ipratropium  0.5 mg Nebulization QID  . lamoTRIgine  200 mg Oral Daily  . levalbuterol  0.63 mg Nebulization QID  . lidocaine (cardiac) 100 mg/19ml      . linagliptin  5 mg Oral  Daily  . mycophenolate (CELLCEPT) IVPB  500 mg Intravenous Q12H  . pantoprazole (PROTONIX) IV  40 mg Intravenous Q24H  . piperacillin-tazobactam (ZOSYN)  IV  3.375 g Intravenous Q8H  . potassium chloride  10 mEq Intravenous Q1 Hr x 3  . potassium chloride  40 mEq Per Tube Once  . pyridostigmine  30 mg Oral TID  . rocuronium      . senna  1 tablet Oral BID  . sodium chloride  3 mL Intravenous Q12H  . succinylcholine      . tacrolimus  5 mg Oral BID  . Tamsulosin HCl  0.4 mg Oral Daily  . vancomycin  1,250 mg Intravenous Q24H  . DISCONTD: aspirin EC  81 mg Oral Daily  . DISCONTD: docusate sodium  100 mg Oral BID  . DISCONTD: furosemide  40 mg Intravenous Q12H  . DISCONTD: furosemide  60 mg Intravenous TID  . DISCONTD: mycophenolate  500 mg Oral BID  . DISCONTD: mycophenolate  0.5 g Intravenous BID  . DISCONTD: potassium chloride  10 mEq Intravenous Q1 Hr x 4  . DISCONTD: potassium chloride  10 mEq Intravenous Q1 Hr x 2  . DISCONTD: pyridostigmine  60 mg Oral TID  . DISCONTD: tacrolimus  5 mg Oral BID

## 2011-05-25 NOTE — Progress Notes (Signed)
Utilization review completed. Joann Kulpa, RN, BSN. 05/25/11  

## 2011-05-25 NOTE — Progress Notes (Signed)
CSW attempted to meet with family but they were not at bedside. CSW will attempt to follow up with family to provide psychosocial support and address discharge plan as needed. Please call if there is an urgent social work need.   Dede Query, MSW, Theresia Majors [phone]

## 2011-05-25 NOTE — Progress Notes (Signed)
Patient ID: John [name], male   DOB: October 29, 1940, 71 y.o.   MRN: [id number] Patient intubated, sedated.  Respiratory function poor. Multiple medical issues including sepsis. On antibiotics that would cover possible early discitis.

## 2011-05-25 NOTE — Progress Notes (Signed)
Patient ID: TAI SKELLY, male   DOB: 11/22/1940, 71 y.o.   MRN: [id number]     SUBJECTIVE: Intubated and sedated but awakens to stimulation.  Vitals stable.  Lasix decreased, I/O net negative 375 cc.      Marland Kitchen antiseptic oral rinse  1 application Mouth Rinse QID  . aspirin  81 mg Per Tube Daily  . calcium carbonate  2 tablet Oral Daily  . carbidopa-levodopa  1 tablet Oral TID  . chlorhexidine  15 mL Mouth/Throat BID  . docusate  100 mg Per Tube BID  . furosemide  40 mg Intravenous Q12H  . hydrocortisone sod succinate (SOLU-CORTEF) injection  20 mg Intravenous Daily  . insulin aspart  0-20 Units Subcutaneous Q4H  . insulin glargine  12 Units Subcutaneous Daily  . ipratropium  0.5 mg Nebulization QID  . lamoTRIgine  200 mg Oral Daily  . levalbuterol  0.63 mg Nebulization QID  . lidocaine (cardiac) 100 mg/72ml      . linagliptin  5 mg Oral Daily  . mycophenolate (CELLCEPT) IVPB  500 mg Intravenous Q12H  . pantoprazole (PROTONIX) IV  40 mg Intravenous Q24H  . piperacillin-tazobactam (ZOSYN)  IV  3.375 g Intravenous Q8H  . potassium chloride  10 mEq Intravenous Q1 Hr x 4  . potassium chloride  10 mEq Intravenous Q1 Hr x 3  . potassium chloride  40 mEq Per Tube Once  . pyridostigmine  60 mg Oral TID  . rocuronium      . senna  1 tablet Oral BID  . sodium chloride  3 mL Intravenous Q12H  . succinylcholine      . tacrolimus  5 mg Oral BID  . Tamsulosin HCl  0.4 mg Oral Daily  . vancomycin  1,250 mg Intravenous Q24H  . DISCONTD: aspirin EC  81 mg Oral Daily  . DISCONTD: docusate sodium  100 mg Oral BID  . DISCONTD: furosemide  60 mg Intravenous TID  . DISCONTD: mycophenolate  500 mg Oral BID  . DISCONTD: mycophenolate  0.5 g Intravenous BID  . DISCONTD: potassium chloride  10 mEq Intravenous Q1 Hr x 4  . DISCONTD: potassium chloride  10 mEq Intravenous Q1 Hr x 2  . DISCONTD: tacrolimus  5 mg Oral BID  . DISCONTD: vancomycin  1,250 mg Intravenous Q24H  Propofol gtt Fentanyl  gtt    Filed Vitals:   05/25/11 0600 05/25/11 0700 05/25/11 0754 05/25/11 0805  BP: 121/62 120/71 120/71   Pulse: 83 90 88   Temp:      TempSrc:      Resp: 15 13 16    Height:      Weight:      SpO2: 100% 100% 100% 100%    Intake/Output Summary (Last 24 hours) at 05/25/11 0833 Last data filed at 05/25/11 0800  Gross per 24 hour  Intake 2392.1 ml  Output   2975 ml  Net -582.9 ml    LABS: Basic Metabolic Panel:  Basename 05/25/11 0305 05/24/11 0250  NA 143 141  K 3.2* 3.2*  CL 99 95*  CO2 35* 37*  GLUCOSE 154* 129*  BUN 46* 54*  CREATININE 1.43* 1.47*  CALCIUM 9.8 9.9  MG 2.2 2.1  PHOS 2.8 2.4   Liver Function Tests:  Basename 05/24/11 0250 05/23/11 0537  AST -- --  ALT -- --  ALKPHOS -- --  BILITOT -- --  PROT -- --  ALBUMIN 2.6* 2.5*   No results found for this basename: LIPASE:2,AMYLASE:2 in  the last 72 hours CBC:  Basename 05/25/11 0305 05/24/11 0250  WBC 15.1* 12.3*  NEUTROABS -- 9.1*  HGB 9.9* 9.5*  HCT 31.9* 29.3*  MCV 69.2* 68.3*  PLT 259 230   Cardiac Enzymes: No results found for this basename: CKTOTAL:3,CKMB:3,CKMBINDEX:3,TROPONINI:3 in the last 72 hours BNP: No components found with this basename: POCBNP:3 D-Dimer: No results found for this basename: DDIMER:2 in the last 72 hours Hemoglobin A1C: No results found for this basename: HGBA1C in the last 72 hours Fasting Lipid Panel:  Basename 05/24/11 1211  CHOL 129  HDL 30*  LDLCALC 71  TRIG 562  CHOLHDL 4.3  LDLDIRECT --   Thyroid Function Tests: No results found for this basename: TSH,T4TOTAL,FREET3,T3FREE,THYROIDAB in the last 72 hours Anemia Panel: No results found for this basename: VITAMINB12,FOLATE,FERRITIN,TIBC,IRON,RETICCTPCT in the last 72 hours   PHYSICAL EXAM  General: Intubated/sedated Neck: JVP difficult with vent, no thyromegaly or thyroid nodule.  Lungs: Dependent crackles.  CV: Nondisplaced PMI. Heart regular S1/S2, no S3/S4, 2/6 SEM RUSB. No edema. No  carotid bruit.  Abdomen: Soft, nontender, no hepatosplenomegaly, no distention.  Extremities: No clubbing or cyanosis.   ASSESSMENT AND PLAN:  71 yo with history of renal transplant, DM, HTN, COPD on home oxygen, diastolic CHF, and CAD s/p CABG is now s/p back surgery. He had post-op delirium and acute respiratory failure with intubation. 1. CHF: CXR with pulmonary edema. Acute on chronic diastolic CHF. I/O -375 yesterday.  Suspect CHF played role in respiratory decompensation. BUN/creatinine improved. - Gentle increase in Lasix to 40 mg IV q8 hrs - Check CVPs 2. Pulmonary: Respiratory failure with pulmonary edema and possible HCAP (? Aspiration).  Continue Lasix as above and antibiotics. SBT ongoing this morning. May need more fluid off lungs before extubation.  3. Neuro: Delirium post-op.  He is on pyridostigmine for orthostasis.  It is possible that this could contribute to symptoms.  Will decrease dose.  4. Renal: BUN/creatinine with mild improvement today.  Will increase diuretics gently.  Follow closely.   [name] 05/25/2011 8:33 AM

## 2011-05-25 NOTE — Progress Notes (Signed)
eLink Physician-Brief Progress Note Patient Name: John [name] DOB: 06-02-1940 MRN: [id number]  Date of Service  05/25/2011   HPI/Events of Note   Lab 05/25/11 0305 05/24/11 0250 05/23/11 0537 05/22/11 0400 05/21/11 0612  K 3.2* 3.2* 3.0* 3.9 4.9    Lab 05/25/11 0305 05/24/11 0250 05/23/11 0537 05/22/11 0400 05/21/11 0612  CREATININE 1.43* 1.47* 1.54* 1.46* 1.44*       eICU Interventions  kcl via tube x 1   Intervention Category Intermediate Interventions: Electrolyte abnormality - evaluation and management  [name] 05/25/2011, 5:29 AM

## 2011-05-26 ENCOUNTER — Inpatient Hospital Stay (HOSPITAL_COMMUNITY): Payer: Medicare Other

## 2011-05-26 LAB — GLUCOSE, CAPILLARY
Glucose-Capillary: 184 mg/dL — ABNORMAL HIGH (ref 70–99)
Glucose-Capillary: 349 mg/dL — ABNORMAL HIGH (ref 70–99)
Glucose-Capillary: 317 mg/dL — ABNORMAL HIGH (ref 70–99)
Glucose-Capillary: 335 mg/dL — ABNORMAL HIGH (ref 70–99)

## 2011-05-26 LAB — BASIC METABOLIC PANEL
CO2: 32 mEq/L (ref 19–32)
Chloride: 107 mEq/L (ref 96–112)
Creatinine, Ser: 1.55 mg/dL — ABNORMAL HIGH (ref 0.50–1.35)
GFR calc Af Amer: 50 mL/min — ABNORMAL LOW (ref >90)
Potassium: 4.4 mEq/L (ref 3.5–5.1)
Sodium: 148 mEq/L — ABNORMAL HIGH (ref 135–145)

## 2011-05-26 LAB — BLOOD GAS, ARTERIAL
Acid-Base Excess: 7.8 mmol/L — ABNORMAL HIGH (ref 0.0–2.0)
Bicarbonate: 32.3 mEq/L — ABNORMAL HIGH (ref 20.0–24.0)
FIO2: 40 %
MECHVT: 580 mL
Patient temperature: 96.8
TCO2: 33.8 mmol/L (ref 0–100)
pH, Arterial: 7.447 (ref 7.350–7.450)

## 2011-05-26 LAB — CBC
MCV: 72.3 fL — ABNORMAL LOW (ref 78.0–100.0)
Platelets: 242 10*3/uL (ref 150–400)
RBC: 4.37 MIL/uL (ref 4.22–5.81)
RDW: 19.3 % — ABNORMAL HIGH (ref 11.5–15.5)
WBC: 14.3 10*3/uL — ABNORMAL HIGH (ref 4.0–10.5)

## 2011-05-26 LAB — MAGNESIUM: Magnesium: 2.1 mg/dL (ref 1.5–2.5)

## 2011-05-26 LAB — CULTURE, RESPIRATORY W GRAM STAIN

## 2011-05-26 LAB — PHOSPHORUS: Phosphorus: 3.3 mg/dL (ref 2.3–4.6)

## 2011-05-26 LAB — TACROLIMUS LEVEL: Tacrolimus (FK506) - LabCorp: 3.5 ng/mL

## 2011-05-26 MED ORDER — PANTOPRAZOLE SODIUM 40 MG PO PACK
40.0000 mg | PACK | Freq: Every day | ORAL | Status: DC
  Administered 2011-05-26 – 2011-06-01 (×7): 40 mg
  Filled 2011-05-26 (×11): qty 20

## 2011-05-26 MED ORDER — FREE WATER
200.0000 mL | Freq: Four times a day (QID) | Status: DC
  Administered 2011-05-26 – 2011-05-28 (×8): 200 mL

## 2011-05-26 NOTE — Progress Notes (Signed)
Patient ID: John [name], male   DOB: [birth year], 71 y.o.   MRN: [id number] Patient is heavily sedated and on ventilator. Does move all 4 extremities when stimulated. Appreciate help of critical care.

## 2011-05-26 NOTE — Progress Notes (Signed)
[facility] Progress Note  1. CKD / renal transplant 2002 / Cr 1.1-1.3. Home regimen includes Prograf 5mg  bid, CellCept 500mg  bid and prednisone 5 mg/d . Renal function worsening, creat up 1.55 today. UOP improved; he was taking Furosemide 80am and 40pm at home. Tacrolimus level was 5.3ng/ml on 05/23/11.  Prograf resumed at prior dose.  Agree with holding diuretics given low CVP and worsening azotemia. 2. Pulmonary edema/respiratory failure 3. Hypokalemic alkalosis due to diuretics-- improved  4. Hypernatremia- agree with holding diuretic and give free water  Subjective:   Sedated using ear phones for music   Objective:   BP 122/53  Pulse 90  Temp(Src) 99.5 F (37.5 C) (Oral)  Resp 14  Ht 6\' 2"  (1.88 m)  Wt 94.7 kg (208 lb 12.4 oz)  BMI 26.81 kg/m2  SpO2 99%  Intake/Output Summary (Last 24 hours) at 05/26/11 0908 Last data filed at 05/26/11 0816  Gross per 24 hour  Intake 1913.9 ml  Output   2755 ml  Net -841.1 ml   Weight change: -0.4 kg (-14.1 oz)  Physical Exam: ZOX:WRUE today CVS:RRR Resp:clear AVW:UJWJ Ext:1+ edema T Tube trial  Imaging: Dg Chest Port 1 View  05/26/2011  *RADIOLOGY REPORT*  Clinical Data: Assess endotracheal tube.  PORTABLE CHEST - 1 VIEW  Comparison: 05/25/2011.  Findings: Endotracheal tube tip 2.3 cm above the carina.  Left central line tip proximal superior vena cava level. Nasogastric tube courses below the diaphragm.  The tip is not included on this exam.  Diffuse air space disease most notable perihilar region suggestive of pulmonary edema.  No gross pneumothorax.  Cardiomegaly.  Calcified aorta.  IMPRESSION: Pulmonary edema without change.  Original Report Authenticated By: Fuller Canada, M.D.   Dg Chest Port 1 View  05/25/2011  *RADIOLOGY REPORT*  Clinical Data: ET tube placement, shortness of breath  PORTABLE CHEST - 1 VIEW  Comparison: May 24, 2011  Findings: The ET tube tip remains well above the carina.  The left IJ central  line tip is stable at the SVC level.  The nasogastric tube courses off the inferior film.  Pulmonary edema persists and does not appear significantly changed.  There are small bilateral pleural effusions, also stable appearing.  IMPRESSION: Stable supportive devices.  No significant change in appearance of pulmonary edema and small bilateral effusions.  Original Report Authenticated By: Brandon Melnick, M.D.    Labs: BMET  Lab 05/26/11 0500 05/25/11 1330 05/25/11 0305 05/24/11 0250 05/23/11 0537 05/22/11 0400 05/21/11 0612  NA 148* 146* 143 141 141 141 138  K 4.4 4.5 3.2* 3.2* 3.0* 3.9 4.9  CL 107 104 99 95* 95* 99 97  CO2 32 32 35* 37* 35* 34* 31  GLUCOSE 249* 180* 154* 129* 194* 222* 198*  BUN 52* 42* 46* 54* 65* 63* 50*  CREATININE 1.55* 1.37* 1.43* 1.47* 1.54* 1.46* 1.44*  ALB -- -- -- -- -- -- --  CALCIUM 9.9 9.8 9.8 9.9 10.0 10.1 10.1  PHOS 3.3 -- 2.8 2.4 3.3 -- 3.9   CBC  Lab 05/26/11 0500 05/25/11 0305 05/24/11 0250 05/22/11 0400 05/20/11 0630  WBC 14.3* 15.1* 12.3* 7.3 --  NEUTROABS -- -- 9.1* -- 8.0*  HGB 9.4* 9.9* 9.5* 8.6* --  HCT 31.6* 31.9* 29.3* 27.7* --  MCV 72.3* 69.2* 68.3* 69.9* --  PLT 242 259 230 174 --    Medications:      . antiseptic oral rinse  1 application Mouth Rinse QID  . aspirin  81 mg Per Tube Daily  . calcium carbonate  2 tablet Oral Daily  . carbidopa-levodopa  1 tablet Oral TID  . chlorhexidine  15 mL Mouth/Throat BID  . docusate  100 mg Per Tube BID  . feeding supplement  30 mL Per Tube TID  . hydrocortisone sod succinate (SOLU-CORTEF) injection  20 mg Intravenous Daily  . insulin aspart  0-20 Units Subcutaneous Q4H  . insulin glargine  12 Units Subcutaneous Daily  . ipratropium  0.5 mg Nebulization QID  . lamoTRIgine  200 mg Oral Daily  . levalbuterol  0.63 mg Nebulization QID  . linagliptin  5 mg Oral Daily  . mycophenolate (CELLCEPT) IVPB  500 mg Intravenous Q12H  . pantoprazole (PROTONIX) IV  40 mg Intravenous Q24H  .  piperacillin-tazobactam (ZOSYN)  IV  3.375 g Intravenous Q8H  . potassium chloride  40 mEq Per Tube TID  . potassium chloride      . pyridostigmine  30 mg Oral TID  . senna  1 tablet Oral BID  . tacrolimus  5 mg Oral BID  . Tamsulosin HCl  0.4 mg Oral Daily  . vancomycin  1,250 mg Intravenous Q24H  . DISCONTD: furosemide  40 mg Intravenous Q8H  . DISCONTD: sodium chloride  3 mL Intravenous Q12H

## 2011-05-26 NOTE — Progress Notes (Addendum)
HPI: 71 yo male with hx COPD, renal transplant (2002), DM on insulin pump who presented 3/26 for lumbar laminotomies and decompression L5 and S1 r/t spondylosis and herniated nucleus pulposus. Post op developed episodes of confusion and respiratory distress thought r/t CHF Which initially improved with lasix. On 3/31 developed worsening respiratory distress and hypoxia and PCCM consulted.   Antibiotics:  Vanc 3/31>>>  Zosyn 3/31>>>   Cultures/Sepsis Markers:   Urine 3/29>>> MORGANELLA MORGANII (6,000 COLONIES/ML)  BCx2 3/28>>> negative  Sputum 3/31>>>  Pct 3/31>>>   Access/Protocols:  L IJ CVL>> 3/31  ET tube>> 4/3  Best Practice:  DVT: SCD  GI: Pantoprazole, Colace   Subjective: Patient laying in bed, NAD, no new complaints.   Physical Exam: Filed Vitals:   05/26/11 1000  BP: 128/49  Pulse: 96  Temp:   Resp: 34    Intake/Output Summary (Last 24 hours) at 05/26/11 1121 Last data filed at 05/26/11 1022  Gross per 24 hour  Intake 2035.7 ml  Output   2830 ml  Net -794.3 ml   Vent Mode:  [-] CPAP FiO2 (%):  [40 %] 40 % Set Rate:  [12 bmp] 12 bmp Vt Set:  [580 mL] 580 mL PEEP:  [5 cmH20] 5 cmH20 Pressure Support:  [8 cmH20-12 cmH20] 8 cmH20 Plateau Pressure:  [19 cmH20-23 cmH20] 21 cmH20  General: chronically ill appearing male, AOx1. Neuro: awake, confused, MAE, gen weakness.  CV: irregularly irregular, no m/r/g.  PULM: resps even, mildly labored, diminished throughout with few scattered ronchi.  GI: abd soft, +bs.  Extremities: Warm and dry, trace BLE edema.   Labs: CBC:    Component Value Date/Time   WBC 14.3* 05/26/2011 0500   HGB 9.4* 05/26/2011 0500   HCT 31.6* 05/26/2011 0500   PLT 242 05/26/2011 0500   MCV 72.3* 05/26/2011 0500   NEUTROABS 9.1* 05/24/2011 0250   LYMPHSABS 1.7 05/24/2011 0250   MONOABS 1.3* 05/24/2011 0250   EOSABS 0.5 05/24/2011 0250   BASOSABS 0.0 05/24/2011 0250   Basic Metabolic Panel:    Component Value Date/Time   NA 148* 05/26/2011 0500   K 4.4 05/26/2011 0500   CL 107 05/26/2011 0500   CO2 32 05/26/2011 0500   BUN 52* 05/26/2011 0500   CREATININE 1.55* 05/26/2011 0500   GLUCOSE 249* 05/26/2011 0500   CALCIUM 9.9 05/26/2011 0500   Comprehensive Metabolic Panel:    Component Value Date/Time   NA 148* 05/26/2011 0500   K 4.4 05/26/2011 0500   CL 107 05/26/2011 0500   CO2 32 05/26/2011 0500   BUN 52* 05/26/2011 0500   CREATININE 1.55* 05/26/2011 0500   GLUCOSE 249* 05/26/2011 0500   CALCIUM 9.9 05/26/2011 0500   AST 16 05/20/2011 0836   ALT 6 05/20/2011 0836   ALKPHOS 121* 05/20/2011 0836   BILITOT 0.8 05/20/2011 0836   PROT 6.3 05/20/2011 0836   ALBUMIN 2.6* 05/24/2011 0250   ABG    Component Value Date/Time   PHART 7.447 05/26/2011 0414   PCO2ART 46.9* 05/26/2011 0414   PO2ART 104.0* 05/26/2011 0414   HCO3 32.3* 05/26/2011 0414   TCO2 33.8 05/26/2011 0414   ACIDBASEDEF 1.0 04/22/2009 0905   O2SAT 98.8 05/26/2011 0414    Lab 05/26/11 0500  MG 2.1   Lab Results  Component Value Date   CALCIUM 9.9 05/26/2011   PHOS 3.3 05/26/2011    Assessment & Plan:  Acute respiratory failure  Likely multifactorial in setting volume overload with underlying COPD +/- HCAP post  lumbar surgery in immunosuppressed patient. Patient is afebrile without WBC elevation. CXR shows diffuse edema without clear focal infiltrate, intubated 4/3 with respiratory distress. Currently on full vent support. CVP 2-4 today with persistent edema on CXR, suggesting a non-cardiogenic pulmonary edema vs ARDS, more likely the later. - Hold Lasix. - Free water only no IVF. - PS trials today and in AM, patient is very tachypienic during those, muscle strength is too low and lungs are too stiff. - Continue Vanc and Zosyn.  DM  home on insulin pump.  - Hold insulin pump while critically ill.  - Lantus and SSI.  HTN  - Continue to monitor  Chronic renal insufficiency s/p renal transplant 2002  Renal following.  - Cont cellcept, prograf.  - Hold diuresis as above.  Best practices /  Disposition  -->ICU status under PCCM  -->full code  -->SCD's for DVT Px  -->Protonix and Colace for GI Px   TOBBIA,PATRICK, MD  Spoke with family, they wish to continue support for now and hold diureses for now and follow renal function.  With ARDS becoming more likely here, the prognosis is obviously worse and that was relayed to his family.  CC time 45 minutes.  Patient seen and examined, agree with above note.  I dictated the care and orders written for this patient under my direction.  Koren Bound, M.D. [phone]

## 2011-05-26 NOTE — Progress Notes (Signed)
Patient ID: John [name], male   DOB: July 22, 1940, 71 y.o.   MRN: [id number]     SUBJECTIVE: Intubated and sedated.  Vitals stable.  CVP running 2-4 this am.      . antiseptic oral rinse  1 application Mouth Rinse QID  . aspirin  81 mg Per Tube Daily  . calcium carbonate  2 tablet Oral Daily  . carbidopa-levodopa  1 tablet Oral TID  . chlorhexidine  15 mL Mouth/Throat BID  . docusate  100 mg Per Tube BID  . feeding supplement  30 mL Per Tube TID  . furosemide  40 mg Intravenous Q8H  . hydrocortisone sod succinate (SOLU-CORTEF) injection  20 mg Intravenous Daily  . insulin aspart  0-20 Units Subcutaneous Q4H  . insulin glargine  12 Units Subcutaneous Daily  . ipratropium  0.5 mg Nebulization QID  . lamoTRIgine  200 mg Oral Daily  . levalbuterol  0.63 mg Nebulization QID  . linagliptin  5 mg Oral Daily  . mycophenolate (CELLCEPT) IVPB  500 mg Intravenous Q12H  . pantoprazole (PROTONIX) IV  40 mg Intravenous Q24H  . piperacillin-tazobactam (ZOSYN)  IV  3.375 g Intravenous Q8H  . potassium chloride  40 mEq Per Tube TID  . potassium chloride      . pyridostigmine  30 mg Oral TID  . senna  1 tablet Oral BID  . tacrolimus  5 mg Oral BID  . Tamsulosin HCl  0.4 mg Oral Daily  . vancomycin  1,250 mg Intravenous Q24H  . DISCONTD: furosemide  40 mg Intravenous Q12H  . DISCONTD: pyridostigmine  60 mg Oral TID  . DISCONTD: sodium chloride  3 mL Intravenous Q12H  Versed gtt Fentanyl gtt    Filed Vitals:   05/26/11 0600 05/26/11 0700 05/26/11 0726 05/26/11 0800  BP: 97/36 104/51  122/53  Pulse: 88 80  85  Temp:   99.5 F (37.5 C)   TempSrc:   Oral   Resp: 14 14  17   Height:      Weight:      SpO2: 100% 99%  99%    Intake/Output Summary (Last 24 hours) at 05/26/11 0827 Last data filed at 05/26/11 0816  Gross per 24 hour  Intake 1988.8 ml  Output   3005 ml  Net -1016.2 ml    LABS: Basic Metabolic Panel:  Basename 05/26/11 0500 05/25/11 1330 05/25/11 0305  NA 148*  146* --  K 4.4 4.5 --  CL 107 104 --  CO2 32 32 --  GLUCOSE 249* 180* --  BUN 52* 42* --  CREATININE 1.55* 1.37* --  CALCIUM 9.9 9.8 --  MG 2.1 -- 2.2  PHOS 3.3 -- 2.8   Liver Function Tests:  Basename 05/24/11 0250  AST --  ALT --  ALKPHOS --  BILITOT --  PROT --  ALBUMIN 2.6*   No results found for this basename: LIPASE:2,AMYLASE:2 in the last 72 hours CBC:  Basename 05/26/11 0500 05/25/11 0305 05/24/11 0250  WBC 14.3* 15.1* --  NEUTROABS -- -- 9.1*  HGB 9.4* 9.9* --  HCT 31.6* 31.9* --  MCV 72.3* 69.2* --  PLT 242 259 --   Cardiac Enzymes: No results found for this basename: CKTOTAL:3,CKMB:3,CKMBINDEX:3,TROPONINI:3 in the last 72 hours BNP: No components found with this basename: POCBNP:3 D-Dimer: No results found for this basename: DDIMER:2 in the last 72 hours Hemoglobin A1C: No results found for this basename: HGBA1C in the last 72 hours Fasting Lipid Panel:  Basename 05/24/11 1211  CHOL 129  HDL 30*  LDLCALC 71  TRIG 086  CHOLHDL 4.3  LDLDIRECT --   Thyroid Function Tests: No results found for this basename: TSH,T4TOTAL,FREET3,T3FREE,THYROIDAB in the last 72 hours Anemia Panel: No results found for this basename: VITAMINB12,FOLATE,FERRITIN,TIBC,IRON,RETICCTPCT in the last 72 hours   PHYSICAL EXAM  General: Intubated/sedated Neck: JVP difficult with vent, no thyromegaly or thyroid nodule.  Lungs: Dependent crackles.  CV: Nondisplaced PMI. Heart regular S1/S2, no S3/S4, 2/6 SEM RUSB. No edema. No carotid bruit.  Abdomen: Soft, nontender, no hepatosplenomegaly, no distention.  Extremities: No clubbing or cyanosis.   ASSESSMENT AND PLAN:  71 yo with history of renal transplant, DM, HTN, COPD on home s, diastolic CHF, and CAD s/p CABG is now s/p back surgery. He had post-op delirium and acute respiratory failure with intubation. 1. CHF: CVP 2-4 now.  CXR with pulmonary edema pattern.  ? noncardiogenic edema, ARDS picture.  - D/c Lasix for now,  continue to follow CVP.  2. Pulmonary: Respiratory failure with suspected noncardiogenic pulmonary edema at this point and possible HCAP (? Aspiration).  Continue antibiotics. Hold Lasix as above.  Good mechanics with SBT yesterday.  Would favor weaning sedation and with SBT again today with goal extubation.  3. Neuro: Delirium post-op.  Decreased pyridostigmine yesterday.   4. Renal: BUN/creatinine increased.  Stopping Lasix as above.  Follow CVP.   [name] 05/26/2011 8:27 AM

## 2011-05-26 NOTE — Progress Notes (Signed)
Inpatient Diabetes Program Recommendations  AACE/ADA: New Consensus Statement on Inpatient Glycemic Control (2009)  Target Ranges:  Prepandial:   less than 140 mg/dL      Peak postprandial:   less than 180 mg/dL (1-2 hours)      Critically ill patients:  140 - 180 mg/dL   Reason for Visit: Hyperglycemia  Results for John [name], John [name] (MRN [id number]) as of 05/26/2011 09:36  Ref. Range 05/25/2011 03:16 05/25/2011 08:44 05/25/2011 11:47 05/25/2011 16:05 05/25/2011 19:38 05/25/2011 23:56 05/26/2011 02:54 05/26/2011 07:25  Glucose-Capillary Latest Range: 70-99 mg/dL 409 (H) 811 (H) 914 (H) 199 (H) 203 (H) 183 (H) 184 (H) 269 (H)    Inpatient Diabetes Program Recommendations Insulin - Basal: Please titrate Lantus until FBS < 140 mg/dL   Insulin - Meal Coverage: Add Novolog 2 units Q 4 ours while on Solucortef.  Note: Will follow.

## 2011-05-26 NOTE — Progress Notes (Signed)
ID PROGRESS NOTE 71 yo Male with history of DM, HTN, CAD, ESRD s/p Kidney allograft in 2002, IMS on cellcept 500mg  BID, tacro 5 BID,hydrocort 20mg  IV daily, presented for elective laminectomy c/b respiratory distress and intermittent fevers concerning for infection in immunocompromised host, currently on vanco and piptazo  24hr events: Remains intubated, with fevers today tmax 100.8  abtx day #6, vanco  & pip/tazo     . antiseptic oral rinse  1 application Mouth Rinse QID  . aspirin  81 mg Per Tube Daily  . calcium carbonate  2 tablet Oral Daily  . carbidopa-levodopa  1 tablet Oral TID  . chlorhexidine  15 mL Mouth/Throat BID  . docusate  100 mg Per Tube BID  . feeding supplement  30 mL Per Tube TID  . free water  200 mL Per Tube Q6H  . hydrocortisone sod succinate (SOLU-CORTEF) injection  20 mg Intravenous Daily  . insulin aspart  0-20 Units Subcutaneous Q4H  . insulin glargine  12 Units Subcutaneous Daily  . ipratropium  0.5 mg Nebulization QID  . lamoTRIgine  200 mg Oral Daily  . levalbuterol  0.63 mg Nebulization QID  . linagliptin  5 mg Oral Daily  . mycophenolate (CELLCEPT) IVPB  500 mg Intravenous Q12H  . pantoprazole sodium  40 mg Per Tube Q1200  . piperacillin-tazobactam (ZOSYN)  IV  3.375 g Intravenous Q8H  . potassium chloride  40 mEq Per Tube TID  . potassium chloride      . pyridostigmine  30 mg Oral TID  . senna  1 tablet Oral BID  . tacrolimus  5 mg Oral BID  . Tamsulosin HCl  0.4 mg Oral Daily  . vancomycin  1,250 mg Intravenous Q24H  . DISCONTD: furosemide  40 mg Intravenous Q8H  . DISCONTD: pantoprazole (PROTONIX) IV  40 mg Intravenous Q24H  . DISCONTD: sodium chloride  3 mL Intravenous Q12H    Objective: BP 152/51  Pulse 91  Temp(Src) 100.7 F (38.2 C) (Oral)  Resp 17  Ht 6\' 2"  (1.88 m)  Wt 208 lb 12.4 oz (94.7 kg)  BMI 26.81 kg/m2  SpO2 100% gen = sedated, intubated HEENT=  ET tube in place pulm = mild rhonchi Cors= nl s1,s2, no g/m/r Abd=  soft, decreased BS, NTND Ext = trace edema Skin = warm to touch, moist Neuro = unable to assess due to sedation  Lab Results  Basename 05/26/11 0500 05/25/11 1330 05/25/11 0305  WBC 14.3* -- 15.1*  HGB 9.4* -- 9.9*  HCT 31.6* -- 31.9*  NA 148* 146* --  K 4.4 4.5 --  CL 107 104 --  CO2 32 32 --  BUN 52* 42* --  CREATININE 1.55* 1.37* --  GLU -- -- --   Liver Panel  Basename 05/24/11 0250  PROT --  ALBUMIN 2.6*  AST --  ALT --  ALKPHOS --  BILITOT --  BILIDIR --  IBILI --   tacro 3.5 on 4/4  Microbiology: Recent Results (from the past 240 hour(s))  CULTURE, BLOOD (ROUTINE X 2)     Status: Normal   Collection Time   05/18/11  6:45 PM      Component Value Range Status Comment   Specimen Description BLOOD LEFT ARM   Final    Special Requests BOTTLES DRAWN AEROBIC ONLY 10CC   Final    Culture  Setup Time [id number]   Final    Culture NO GROWTH 5 DAYS   Final    Report Status  05/25/2011 FINAL   Final   CULTURE, BLOOD (ROUTINE X 2)     Status: Normal   Collection Time   05/18/11  6:50 PM      Component Value Range Status Comment   Specimen Description BLOOD RIGHT HAND   Final    Special Requests BOTTLES DRAWN AEROBIC ONLY 10CC   Final    Culture  Setup Time [id number]   Final    Culture NO GROWTH 5 DAYS   Final    Report Status 05/25/2011 FINAL   Final   URINE CULTURE     Status: Normal   Collection Time   05/19/11 10:33 AM      Component Value Range Status Comment   Specimen Description URINE, CATHETERIZED   Final    Special Requests CONDOM   Final    Culture  Setup Time [id number]   Final    Colony Count 6,000 COLONIES/ML   Final    Culture [facility] MORGANII   Final    Report Status 05/22/2011 FINAL   Final    Organism ID, Bacteria MORGANELLA MORGANII   Final   CULTURE, SPUTUM-ASSESSMENT     Status: Normal   Collection Time   05/19/11 11:16 AM      Component Value Range Status Comment   Specimen Description SPUTUM   Final    Special Requests NONE    Final    Sputum evaluation     Final    Value: MICROSCOPIC FINDINGS SUGGEST THAT THIS SPECIMEN IS NOT REPRESENTATIVE OF LOWER RESPIRATORY SECRETIONS. PLEASE RECOLLECT.     Gram Stain Report Called to,Read Back By and Verified With: Wu RN 11:55 05/19/11 ([email or web address])   Report Status 05/19/2011 FINAL   Final   CULTURE, SPUTUM-ASSESSMENT     Status: Normal   Collection Time   05/19/11  5:52 PM      Component Value Range Status Comment   Specimen Description SPUTUM   Final    Special Requests Immunocompromised   Final    Sputum evaluation     Final    Value: MICROSCOPIC FINDINGS SUGGEST THAT THIS SPECIMEN IS NOT REPRESENTATIVE OF LOWER RESPIRATORY SECRETIONS. PLEASE RECOLLECT.     CALLED TO RN D.JOHNSON AT 2016 BY L.PITT 0/29/13   Report Status 05/19/2011 FINAL   Final   MRSA PCR SCREENING     Status: Normal   Collection Time   05/21/11  9:50 AM      Component Value Range Status Comment   MRSA by PCR NEGATIVE  NEGATIVE  Final   CULTURE, BLOOD (ROUTINE X 2)     Status: Normal (Preliminary result)   Collection Time   05/24/11 12:40 PM      Component Value Range Status Comment   Specimen Description BLOOD LEFT ARM   Final    Special Requests BOTTLES DRAWN AEROBIC AND ANAEROBIC 10CC   Final    Culture  Setup Time [id number]   Final    Culture     Final    Value:        BLOOD CULTURE RECEIVED NO GROWTH TO DATE CULTURE WILL BE HELD FOR 5 DAYS BEFORE ISSUING A FINAL NEGATIVE REPORT   Report Status PENDING   Incomplete   CULTURE, BLOOD (ROUTINE X 2)     Status: Normal (Preliminary result)   Collection Time   05/24/11 12:40 PM      Component Value Range Status Comment   Specimen Description BLOOD RIGHT WRIST   Final    Special Requests  BOTTLES DRAWN AEROBIC AND ANAEROBIC 10CC   Final    Culture  Setup Time [id number]   Final    Culture     Final    Value:        BLOOD CULTURE RECEIVED NO GROWTH TO DATE CULTURE WILL BE HELD FOR 5 DAYS BEFORE ISSUING A FINAL NEGATIVE REPORT   Report Status PENDING    Incomplete   CULTURE, RESPIRATORY     Status: Normal   Collection Time   05/24/11  3:01 PM      Component Value Range Status Comment   Specimen Description TRACHEAL ASPIRATE   Final    Special Requests NONE   Final    Gram Stain     Final    Value: FEW WBC PRESENT,BOTH PMN AND MONONUCLEAR     NO SQUAMOUS EPITHELIAL CELLS SEEN     NO ORGANISMS SEEN   Culture RARE CANDIDA ALBICANS   Final    Report Status 05/26/2011 FINAL   Final     Studies/Results: Dg Chest Port 1 View  05/26/2011  *RADIOLOGY REPORT*  Clinical Data: Assess endotracheal tube.  PORTABLE CHEST - 1 VIEW  Comparison: 05/25/2011.  Findings: Endotracheal tube tip 2.3 cm above the carina.  Left central line tip proximal superior vena cava level. Nasogastric tube courses below the diaphragm.  The tip is not included on this exam.  Diffuse air space disease most notable perihilar region suggestive of pulmonary edema.  No gross pneumothorax.  Cardiomegaly.  Calcified aorta.  IMPRESSION: Pulmonary edema without change.  Original Report Authenticated By: Fuller Canada, M.D.   Dg Chest Port 1 View  05/25/2011  *RADIOLOGY REPORT*  Clinical Data: ET tube placement, shortness of breath  PORTABLE CHEST - 1 VIEW  Comparison: May 24, 2011  Findings: The ET tube tip remains well above the carina.  The left IJ central line tip is stable at the SVC level.  The nasogastric tube courses off the inferior film.  Pulmonary edema persists and does not appear significantly changed.  There are small bilateral pleural effusions, also stable appearing.  IMPRESSION: Stable supportive devices.  No significant change in appearance of pulmonary edema and small bilateral effusions.  Original Report Authenticated By: Brandon Melnick, M.D.   Assessment/Plan: 71yo Male with ESDR s/p kidney txp (related donor, CMV status unk), IMS of tacro 5mg  BID, cellcept 500mg  BID, hydrocort 20mg  IV. Now with recent intubation, low-grade fever, and leukocytosis On vanco and piptazo  for empiric coverage for HCAP vs. CHF exacerbation vs. ARDS  1) recurrent fevers = please repeat blood cultures, ua and urine cx  2) possible HCAP = sputum cultures negative. Currently on Day #6 of 8 of vanco and piptazo.  - recent tracheal aspirate 4/3 on showed few c.albicans. Since the patient has not improved from pulmonary standpoint, consider to doing chest CT to see if evidence of atypical pneumonia vs. Characteristics associated with pulm edema.  3) IMS = subtherapeutic tacro at 3.5 on 4/4. Currently kept on 5mg  BID, cellcept 500mg  BID, and hydrocort  4) respiratory distress/ARDS = managed per primary team.  5) if no MRSA on repeat aspriate cx, then we will discontinue vancomycin.  6) if patient becomes hypotensive, would change piptazo to meropenem, renally dosed    Dr. Enedina Finner available this weekend for questions.   LOS: 10 days   Angelisse Riso B. Drue Second MD MPH [facility] [phone]    Judyann Munson 05/26/2011, 4:37 PM

## 2011-05-26 NOTE — Progress Notes (Signed)
Patient is receiving Protonix by the IV route.  Pt meets the P & T approved criteria for changing to oral administration.  - No GI bleeding  - Tolerating an oral or per tube diet  - Taking other oral or per tube medications.  Will change patient to Protonix 40mg PO daily per P&T policy.  Thank you. Collie Wernick, Pharm.D., BCPS Clinical Pharmacist Pager [phone]    

## 2011-05-27 ENCOUNTER — Inpatient Hospital Stay (HOSPITAL_COMMUNITY): Payer: Medicare Other

## 2011-05-27 DIAGNOSIS — M7989 Other specified soft tissue disorders: Secondary | ICD-10-CM

## 2011-05-27 LAB — BLOOD GAS, ARTERIAL
Acid-Base Excess: 9.1 mmol/L — ABNORMAL HIGH (ref 0.0–2.0)
Drawn by: 24513
FIO2: 40 %
MECHVT: 580 mL
RATE: 12 resp/min
pCO2 arterial: 44.9 mmHg (ref 35.0–45.0)
pH, Arterial: 7.48 — ABNORMAL HIGH (ref 7.350–7.450)
pO2, Arterial: 115 mmHg — ABNORMAL HIGH (ref 80.0–100.0)

## 2011-05-27 LAB — CBC
MCH: 21.2 pg — ABNORMAL LOW (ref 26.0–34.0)
MCHC: 29.8 g/dL — ABNORMAL LOW (ref 30.0–36.0)
RDW: 19.2 % — ABNORMAL HIGH (ref 11.5–15.5)

## 2011-05-27 LAB — BASIC METABOLIC PANEL
BUN: 59 mg/dL — ABNORMAL HIGH (ref 6–23)
Calcium: 9.7 mg/dL (ref 8.4–10.5)
GFR calc Af Amer: 53 mL/min — ABNORMAL LOW (ref >90)
GFR calc non Af Amer: 46 mL/min — ABNORMAL LOW (ref >90)
Glucose, Bld: 418 mg/dL — ABNORMAL HIGH (ref 70–99)
Potassium: 3.6 mEq/L (ref 3.5–5.1)
Sodium: 147 mEq/L — ABNORMAL HIGH (ref 135–145)

## 2011-05-27 LAB — PHOSPHORUS: Phosphorus: 2.5 mg/dL (ref 2.3–4.6)

## 2011-05-27 LAB — GLUCOSE, CAPILLARY
Glucose-Capillary: 281 mg/dL — ABNORMAL HIGH (ref 70–99)
Glucose-Capillary: 323 mg/dL — ABNORMAL HIGH (ref 70–99)
Glucose-Capillary: 393 mg/dL — ABNORMAL HIGH (ref 70–99)
Glucose-Capillary: 416 mg/dL — ABNORMAL HIGH (ref 70–99)
Glucose-Capillary: 451 mg/dL — ABNORMAL HIGH (ref 70–99)

## 2011-05-27 MED ORDER — INSULIN GLARGINE 100 UNIT/ML ~~LOC~~ SOLN
12.0000 [IU] | Freq: Once | SUBCUTANEOUS | Status: AC
  Administered 2011-05-27: 12 [IU] via SUBCUTANEOUS

## 2011-05-27 MED ORDER — INSULIN GLARGINE 100 UNIT/ML ~~LOC~~ SOLN: 24.0000 [IU] | Freq: Every day | SUBCUTANEOUS | Status: DC

## 2011-05-27 MED ORDER — INSULIN GLARGINE 100 UNIT/ML ~~LOC~~ SOLN: 24.0000 [IU] | SUBCUTANEOUS | Status: DC

## 2011-05-27 NOTE — Progress Notes (Signed)
ANTIBIOTIC CONSULT NOTE - Follow-up  Pharmacy Consult for vancomycin Indication: rule out pneumonia  No Known Allergies  Patient Measurements: Height: 6\' 2"  (188 cm) Weight: 210 lb 15.7 oz (95.7 kg) IBW/kg (Calculated) : 82.2   Vital Signs: Temp: 101.1 F (38.4 C) (04/06 1200) Temp src: Oral (04/06 1200) BP: 117/43 mmHg (04/06 1210) Pulse Rate: 91  (04/06 1210) Intake/Output from previous day: 04/05 0701 - 04/06 0700 In: 3082.4 [I.V.:468.4; NG/GT:2360; IV Piggyback:254] Out: 2565 [Urine:2565] Intake/Output from this shift: Total I/O In: 1028 [I.V.:208; NG/GT:520; IV Piggyback:300] Out: 600 [Urine:600]  Labs:  [facility] 05/27/11 0515 05/26/11 0500 05/25/11 1330 05/25/11 0305  WBC 13.2* 14.3* -- 15.1*  HGB 8.8* 9.4* -- 9.9*  PLT 226 242 -- 259  LABCREA -- -- -- --  CREATININE 1.48* 1.55* 1.37* --   Estimated Creatinine Clearance: 53.2 ml/min (by C-G formula based on Cr of 1.48).  Basename 05/24/11 1741  VANCOTROUGH --  Leodis Binet --  VANCORANDOM 16.9  GENTTROUGH --  GENTPEAK --  GENTRANDOM --  TOBRATROUGH --  Nolen Mu --  TOBRARND --  AMIKACINPEAK --  AMIKACINTROU --  AMIKACIN --     Microbiology: Recent Results (from the past 720 hour(s))  SURGICAL PCR SCREEN     Status: Normal   Collection Time   05/10/11  1:34 PM      Component Value Range Status Comment   MRSA, PCR NEGATIVE  NEGATIVE  Final    Staphylococcus aureus NEGATIVE  NEGATIVE  Final   CULTURE, BLOOD (ROUTINE X 2)     Status: Normal   Collection Time   05/18/11  6:45 PM      Component Value Range Status Comment   Specimen Description BLOOD LEFT ARM   Final    Special Requests BOTTLES DRAWN AEROBIC ONLY 10CC   Final    Culture  Setup Time [id number]   Final    Culture NO GROWTH 5 DAYS   Final    Report Status 05/25/2011 FINAL   Final   CULTURE, BLOOD (ROUTINE X 2)     Status: Normal   Collection Time   05/18/11  6:50 PM      Component Value Range Status Comment   Specimen Description  BLOOD RIGHT HAND   Final    Special Requests BOTTLES DRAWN AEROBIC ONLY 10CC   Final    Culture  Setup Time [id number]   Final    Culture NO GROWTH 5 DAYS   Final    Report Status 05/25/2011 FINAL   Final   URINE CULTURE     Status: Normal   Collection Time   05/19/11 10:33 AM      Component Value Range Status Comment   Specimen Description URINE, CATHETERIZED   Final    Special Requests CONDOM   Final    Culture  Setup Time [id number]   Final    Colony Count 6,000 COLONIES/ML   Final    Culture [facility] MORGANII   Final    Report Status 05/22/2011 FINAL   Final    Organism ID, Bacteria MORGANELLA MORGANII   Final   CULTURE, SPUTUM-ASSESSMENT     Status: Normal   Collection Time   05/19/11 11:16 AM      Component Value Range Status Comment   Specimen Description SPUTUM   Final    Special Requests NONE   Final    Sputum evaluation     Final    Value: MICROSCOPIC FINDINGS SUGGEST THAT THIS SPECIMEN IS NOT REPRESENTATIVE OF  LOWER RESPIRATORY SECRETIONS. PLEASE RECOLLECT.     Gram Stain Report Called to,Read Back By and Verified With: Wu RN 11:55 05/19/11 ([email or web address])   Report Status 05/19/2011 FINAL   Final   CULTURE, SPUTUM-ASSESSMENT     Status: Normal   Collection Time   05/19/11  5:52 PM      Component Value Range Status Comment   Specimen Description SPUTUM   Final    Special Requests Immunocompromised   Final    Sputum evaluation     Final    Value: MICROSCOPIC FINDINGS SUGGEST THAT THIS SPECIMEN IS NOT REPRESENTATIVE OF LOWER RESPIRATORY SECRETIONS. PLEASE RECOLLECT.     CALLED TO RN D.JOHNSON AT 2016 BY L.PITT 0/29/13   Report Status 05/19/2011 FINAL   Final   MRSA PCR SCREENING     Status: Normal   Collection Time   05/21/11  9:50 AM      Component Value Range Status Comment   MRSA by PCR NEGATIVE  NEGATIVE  Final   CULTURE, BLOOD (ROUTINE X 2)     Status: Normal (Preliminary result)   Collection Time   05/24/11 12:40 PM      Component Value Range Status Comment    Specimen Description BLOOD LEFT ARM   Final    Special Requests BOTTLES DRAWN AEROBIC AND ANAEROBIC 10CC   Final    Culture  Setup Time [id number]   Final    Culture     Final    Value:        BLOOD CULTURE RECEIVED NO GROWTH TO DATE CULTURE WILL BE HELD FOR 5 DAYS BEFORE ISSUING A FINAL NEGATIVE REPORT   Report Status PENDING   Incomplete   CULTURE, BLOOD (ROUTINE X 2)     Status: Normal (Preliminary result)   Collection Time   05/24/11 12:40 PM      Component Value Range Status Comment   Specimen Description BLOOD RIGHT WRIST   Final    Special Requests BOTTLES DRAWN AEROBIC AND ANAEROBIC 10CC   Final    Culture  Setup Time [id number]   Final    Culture     Final    Value:        BLOOD CULTURE RECEIVED NO GROWTH TO DATE CULTURE WILL BE HELD FOR 5 DAYS BEFORE ISSUING A FINAL NEGATIVE REPORT   Report Status PENDING   Incomplete   CULTURE, RESPIRATORY     Status: Normal   Collection Time   05/24/11  3:01 PM      Component Value Range Status Comment   Specimen Description TRACHEAL ASPIRATE   Final    Special Requests NONE   Final    Gram Stain     Final    Value: FEW WBC PRESENT,BOTH PMN AND MONONUCLEAR     NO SQUAMOUS EPITHELIAL CELLS SEEN     NO ORGANISMS SEEN   Culture RARE CANDIDA ALBICANS   Final    Report Status 05/26/2011 FINAL   Final     Medical History: Past Medical History  Diagnosis Date  . CAD (coronary artery disease)     a. myoview 1/11: lg inf fixed defect;   b. cath 1/11: 3v CAD,  c. s/p CABG 1/11 c/b acute renal failure and AFib  . Atrial fibrillation   . Chronic diastolic heart failure     a. echo 4/11: EF 45-50%, mod LVH, LAE, inf and septal HK  . First degree AV block   . Orthostatic hypotension     pyridostigmine  .  History of renal transplant   . DM2 (diabetes mellitus, type 2)   . Diabetic gastroparesis   . HTN (hypertension)   . HLD (hyperlipidemia)   . PAD (peripheral artery disease)     s/p R SFA-Pop atherectomy 11/11  . Prostate cancer   . ED  (erectile dysfunction)   . DDD (degenerative disc disease), lumbar   . Tremor   . Obesity   . Low testosterone   . Chronic kidney disease     s/p renal transplant 2002  . Pleural effusion, right     loculated  . COPD (chronic obstructive pulmonary disease)   . Anxiety   . Diabetes mellitus     insulin pump    Medications:  Anti-infectives     Start     Dose/Rate Route Frequency Ordered Stop   05/24/11 2200   vancomycin (VANCOCIN) 1,250 mg in sodium chloride 0.9 % 250 mL IVPB        1,250 mg 166.7 mL/hr over 90 Minutes Intravenous Every 24 hours 05/24/11 1922     05/24/11 1800   vancomycin (VANCOCIN) 1,250 mg in sodium chloride 0.9 % 250 mL IVPB  Status:  Discontinued        1,250 mg 166.7 mL/hr over 90 Minutes Intravenous Every 24 hours 05/24/11 0141 05/24/11 0940   05/21/11 1000   piperacillin-tazobactam (ZOSYN) IVPB 3.375 g        3.375 g 12.5 mL/hr over 240 Minutes Intravenous Every 8 hours 05/21/11 0855     05/21/11 1000   vancomycin (VANCOCIN) IVPB 1000 mg/200 mL premix  Status:  Discontinued        1,000 mg 200 mL/hr over 60 Minutes Intravenous Every 12 hours 05/21/11 0855 05/23/11 1101   05/16/11 0932   bacitracin 50,000 Units in sodium chloride irrigation 0.9 % 500 mL irrigation  Status:  Discontinued          As needed 05/16/11 0932 05/16/11 1055   05/16/11 0700   bacitracin 16109 UNITS injection     Comments: Imagene Gurney: cabinet override         05/16/11 0700 05/16/11 1914   05/16/11 0000   ceFAZolin (ANCEF) IVPB 2 g/50 mL premix        2 g 100 mL/hr over 30 Minutes Intravenous 60 min pre-op 05/15/11 1449 05/16/11 0828         Assessment: 70 yom admitted for elective back surgery, now s/p laminectomy and decompression of L5 S1 on 3/26, then with worsening confusion to start on empiric vancomycin + zosyn for possible pneumonia.   Patient was previously on vancomycin 1gm IV Q12H with last complete dose given 05/22/11 at 21:00.  05/23/11 AM vancomycin  trough was above-goal at 27.9 mcg/mL. Patient received 1/2 bag of vancomycin before level came back, and it was stopped at that time.  05/23/11 PM vancomycin level above-goal at 26.3 mcg/mL.   4/3 @ 17:40 vanc level 16.9  Renal function stable (CrCl~50-55 ml/min), WBC trending down at 13.2, Tmax 101.1, BCx pending from 4/5  Goal of Therapy:  Vancomycin trough level 15-20 mcg/ml  Plan:  1. Continue Vancomycin 1250 gm IV Q24H starting 05/24/11 at 22:00 2. Consider Vancomycin trough at steady state; follow up renal function and cultures  Concha Norway 05/27/2011,4:05 PM

## 2011-05-27 NOTE — Progress Notes (Signed)
Patient ID: LENARDO WESTWOOD, male   DOB: 1940/04/10, 71 y.o.   MRN: [id number]     SUBJECTIVE: Intubated and sedated.  Vitals stable.  CVP running 5-6 this am.  Minimal progress.  Lung mechanics not suited to extubation yesterday.      Marland Kitchen antiseptic oral rinse  1 application Mouth Rinse QID  . aspirin  81 mg Per Tube Daily  . calcium carbonate  2 tablet Oral Daily  . carbidopa-levodopa  1 tablet Oral TID  . chlorhexidine  15 mL Mouth/Throat BID  . docusate  100 mg Per Tube BID  . feeding supplement  30 mL Per Tube TID  . free water  200 mL Per Tube Q6H  . hydrocortisone sod succinate (SOLU-CORTEF) injection  20 mg Intravenous Daily  . insulin aspart  0-20 Units Subcutaneous Q4H  . insulin glargine  12 Units Subcutaneous Daily  . ipratropium  0.5 mg Nebulization QID  . lamoTRIgine  200 mg Oral Daily  . levalbuterol  0.63 mg Nebulization QID  . linagliptin  5 mg Oral Daily  . mycophenolate (CELLCEPT) IVPB  500 mg Intravenous Q12H  . pantoprazole sodium  40 mg Per Tube Q1200  . piperacillin-tazobactam (ZOSYN)  IV  3.375 g Intravenous Q8H  . pyridostigmine  30 mg Oral TID  . senna  1 tablet Oral BID  . tacrolimus  5 mg Oral BID  . Tamsulosin HCl  0.4 mg Oral Daily  . vancomycin  1,250 mg Intravenous Q24H  . DISCONTD: furosemide  40 mg Intravenous Q8H  . DISCONTD: pantoprazole (PROTONIX) IV  40 mg Intravenous Q24H  Versed gtt Fentanyl gtt    Filed Vitals:   05/27/11 0605 05/27/11 0800 05/27/11 0821 05/27/11 0827  BP: 116/36   111/37  Pulse: 86   87  Temp:  99.8 F (37.7 C)    TempSrc:  Oral    Resp: 14     Height:      Weight:      SpO2: 99%  100% 100%    Intake/Output Summary (Last 24 hours) at 05/27/11 0833 Last data filed at 05/27/11 0600  Gross per 24 hour  Intake 2927.39 ml  Output   2415 ml  Net 512.39 ml    LABS: Basic Metabolic Panel:  Basename 05/27/11 0515 05/26/11 0500  NA 147* 148*  K 3.6 4.4  CL 106 107  CO2 34* 32  GLUCOSE 418* 249*  BUN 59*  52*  CREATININE 1.48* 1.55*  CALCIUM 9.7 9.9  MG 2.3 2.1  PHOS 2.5 3.3   Liver Function Tests: No results found for this basename: AST:2,ALT:2,ALKPHOS:2,BILITOT:2,PROT:2,ALBUMIN:2 in the last 72 hours No results found for this basename: LIPASE:2,AMYLASE:2 in the last 72 hours CBC:  Basename 05/27/11 0515 05/26/11 0500  WBC 13.2* 14.3*  NEUTROABS -- --  HGB 8.8* 9.4*  HCT 29.5* 31.6*  MCV 70.9* 72.3*  PLT 226 242   Cardiac Enzymes: No results found for this basename: CKTOTAL:3,CKMB:3,CKMBINDEX:3,TROPONINI:3 in the last 72 hours BNP: No components found with this basename: POCBNP:3 D-Dimer: No results found for this basename: DDIMER:2 in the last 72 hours Hemoglobin A1C: No results found for this basename: HGBA1C in the last 72 hours Fasting Lipid Panel:  Basename 05/24/11 1211  CHOL 129  HDL 30*  LDLCALC 71  TRIG 119  CHOLHDL 4.3  LDLDIRECT --   Thyroid Function Tests: No results found for this basename: TSH,T4TOTAL,FREET3,T3FREE,THYROIDAB in the last 72 hours Anemia Panel: No results found for this basename: VITAMINB12,FOLATE,FERRITIN,TIBC,IRON,RETICCTPCT in  the last 72 hours   PHYSICAL EXAM  General: Intubated/sedated Neck: JVP difficult with vent, no thyromegaly or thyroid nodule.  Lungs: Dependent crackles.  CV: Nondisplaced PMI. Heart regular S1/S2, no S3/S4, 2/6 SEM RUSB. No edema. No carotid bruit.  Abdomen: Soft, nontender, no hepatosplenomegaly, no distention.  Extremities: No clubbing or cyanosis.   ASSESSMENT AND PLAN:  71 yo with history of renal transplant, DM, HTN, COPD on home s, diastolic CHF, and CAD s/p CABG is now s/p back surgery. He had post-op delirium and acute respiratory failure with intubation. 1. CHF: CVP 5-6 now.  CXR with pulmonary edema pattern. Suspicion for ARDS.  Would hold Lasix for now.  2. Pulmonary: Concern for ARDS with pulmonary edema pattern in absence of evidence for significant volume overload.  3. Renal: Stable renal  function.  He is off Lasix.  4. ID: Febrile last night.  Blood cultures unrevealing. Possible HCAP.  ? Surgical site involvement.   [name] 05/27/2011 8:33 AM

## 2011-05-27 NOTE — Progress Notes (Signed)
eLink Physician-Brief Progress Note Patient Name: John [name] DOB: 10/28/1940 MRN: [id number]  Date of Service  05/27/2011   HPI/Events of Note  hyperglycemia   eICU Interventions  Adjusted insulin   Intervention Category Intermediate Interventions: Hyperglycemia - evaluation and treatment  Lerae Langham J 05/27/2011, 4:38 PM

## 2011-05-27 NOTE — Progress Notes (Signed)
Overall unchanged. Remains sedated on a ventilator . Making minimal progress with weaning.  Patient will waken follow simple commands. His motor strength appears intact. His dressings dry.  No new neurosurgical issues. Continue supportive care and attempted ventilatory weaning.

## 2011-05-27 NOTE — Progress Notes (Signed)
VASCULAR LAB PRELIMINARY  PRELIMINARY  PRELIMINARY  PRELIMINARY  Bilateral lower extremity venous duplex completed.    Preliminary report:  Bilateral:  No evidence of DVT, superficial thrombosis, or Baker's Cyst.   Cheyanne Lamison D, RVS 05/27/2011, 3:02 PM

## 2011-05-27 NOTE — Progress Notes (Signed)
INFECTIOUS DISEASE PROGRESS NOTE  ID: John [name] is a 71 y.o. male with   Principal Problem:  *Herniated nucleus pulposus Active Problems:  HYPERLIPIDEMIA-MIXED  Coronary atherosclerosis of native coronary artery  CORONARY ATHEROSLERO UNSPEC TYPE BYPASS GRAFT  Atrial fibrillation  Chronic diastolic heart failure  PVD WITH CLAUDICATION  Chronic kidney disease  COPD (chronic obstructive pulmonary disease)  First degree AV block  Spondylosis  Diastolic CHF, acute on chronic  DM (diabetes mellitus), type 2  Insulin pump in place  Altered mental status  Acute respiratory failure  Pulmonary edema  Hypoxemia  Subjective: On vent. Nl BM per nsg.   Abtx:  Anti-infectives     Start     Dose/Rate Route Frequency Ordered Stop   05/24/11 2200   vancomycin (VANCOCIN) 1,250 mg in sodium chloride 0.9 % 250 mL IVPB        1,250 mg 166.7 mL/hr over 90 Minutes Intravenous Every 24 hours 05/24/11 1922     05/24/11 1800   vancomycin (VANCOCIN) 1,250 mg in sodium chloride 0.9 % 250 mL IVPB  Status:  Discontinued        1,250 mg 166.7 mL/hr over 90 Minutes Intravenous Every 24 hours 05/24/11 0141 05/24/11 0940   05/21/11 1000  piperacillin-tazobactam (ZOSYN) IVPB 3.375 g       3.375 g 12.5 mL/hr over 240 Minutes Intravenous Every 8 hours 05/21/11 0855     05/21/11 1000   vancomycin (VANCOCIN) IVPB 1000 mg/200 mL premix  Status:  Discontinued        1,000 mg 200 mL/hr over 60 Minutes Intravenous Every 12 hours 05/21/11 0855 05/23/11 1101   05/16/11 0932   bacitracin 50,000 Units in sodium chloride irrigation 0.9 % 500 mL irrigation  Status:  Discontinued          As needed 05/16/11 0932 05/16/11 1055   05/16/11 0700   bacitracin 40981 UNITS injection     Comments: Imagene Gurney: cabinet override         05/16/11 0700 05/16/11 1914   05/16/11 0000   ceFAZolin (ANCEF) IVPB 2 g/50 mL premix        2 g 100 mL/hr over 30 Minutes Intravenous 60 min pre-op 05/15/11 1449 05/16/11 0828            Medications:  Scheduled:   . antiseptic oral rinse  1 application Mouth Rinse QID  . aspirin  81 mg Per Tube Daily  . calcium carbonate  2 tablet Oral Daily  . carbidopa-levodopa  1 tablet Oral TID  . chlorhexidine  15 mL Mouth/Throat BID  . docusate  100 mg Per Tube BID  . feeding supplement  30 mL Per Tube TID  . free water  200 mL Per Tube Q6H  . hydrocortisone sod succinate (SOLU-CORTEF) injection  20 mg Intravenous Daily  . insulin aspart  0-20 Units Subcutaneous Q4H  . insulin glargine  12 Units Subcutaneous Daily  . ipratropium  0.5 mg Nebulization QID  . lamoTRIgine  200 mg Oral Daily  . levalbuterol  0.63 mg Nebulization QID  . linagliptin  5 mg Oral Daily  . mycophenolate (CELLCEPT) IVPB  500 mg Intravenous Q12H  . pantoprazole sodium  40 mg Per Tube Q1200  . piperacillin-tazobactam (ZOSYN)  IV  3.375 g Intravenous Q8H  . pyridostigmine  30 mg Oral TID  . senna  1 tablet Oral BID  . tacrolimus  5 mg Oral BID  . Tamsulosin HCl  0.4 mg Oral Daily  .  vancomycin  1,250 mg Intravenous Q24H  . DISCONTD: pantoprazole (PROTONIX) IV  40 mg Intravenous Q24H    Objective: Vital signs in last 24 hours: Temp:  [98.6 F (37 C)-101.4 F (38.6 C)] 101.1 F (38.4 C) (04/06 1200) Pulse Rate:  [84-96] 91  (04/06 1210) Resp:  [12-28] 14  (04/06 0605) BP: (92-155)/(35-79) 117/43 mmHg (04/06 1210) SpO2:  [99 %-100 %] 100 % (04/06 1210) FiO2 (%):  [40 %] 40 % (04/06 1210) Weight:  [95.7 kg (210 lb 15.7 oz)] 95.7 kg (210 lb 15.7 oz) (04/06 0500)   General appearance: sedated, on vent. no response.  Resp: rhonchi bilaterally Cardio: regular rate and rhythm GI: normal findings: bowel sounds normal and soft, non-tender  Lab Results  Basename 05/27/11 0515 05/26/11 0500  WBC 13.2* 14.3*  HGB 8.8* 9.4*  HCT 29.5* 31.6*  NA 147* 148*  K 3.6 4.4  CL 106 107  CO2 34* 32  BUN 59* 52*  CREATININE 1.48* 1.55*  GLU -- --   Liver Panel No results found for this  basename: PROT:2,ALBUMIN:2,AST:2,ALT:2,ALKPHOS:2,BILITOT:2,BILIDIR:2,IBILI:2 in the last 72 hours Sedimentation Rate No results found for this basename: ESRSEDRATE in the last 72 hours C-Reactive Protein No results found for this basename: CRP:2 in the last 72 hours  Microbiology: Recent Results (from the past 240 hour(s))  CULTURE, BLOOD (ROUTINE X 2)     Status: Normal   Collection Time   05/18/11  6:45 PM      Component Value Range Status Comment   Specimen Description BLOOD LEFT ARM   Final    Special Requests BOTTLES DRAWN AEROBIC ONLY 10CC   Final    Culture  Setup Time [id number]   Final    Culture NO GROWTH 5 DAYS   Final    Report Status 05/25/2011 FINAL   Final   CULTURE, BLOOD (ROUTINE X 2)     Status: Normal   Collection Time   05/18/11  6:50 PM      Component Value Range Status Comment   Specimen Description BLOOD RIGHT HAND   Final    Special Requests BOTTLES DRAWN AEROBIC ONLY 10CC   Final    Culture  Setup Time [id number]   Final    Culture NO GROWTH 5 DAYS   Final    Report Status 05/25/2011 FINAL   Final   URINE CULTURE     Status: Normal   Collection Time   05/19/11 10:33 AM      Component Value Range Status Comment   Specimen Description URINE, CATHETERIZED   Final    Special Requests CONDOM   Final    Culture  Setup Time [id number]   Final    Colony Count 6,000 COLONIES/ML   Final    Culture [facility] MORGANII   Final    Report Status 05/22/2011 FINAL   Final    Organism ID, Bacteria MORGANELLA MORGANII   Final   CULTURE, SPUTUM-ASSESSMENT     Status: Normal   Collection Time   05/19/11 11:16 AM      Component Value Range Status Comment   Specimen Description SPUTUM   Final    Special Requests NONE   Final    Sputum evaluation     Final    Value: MICROSCOPIC FINDINGS SUGGEST THAT THIS SPECIMEN IS NOT REPRESENTATIVE OF LOWER RESPIRATORY SECRETIONS. PLEASE RECOLLECT.     Gram Stain Report Called to,Read Back By and Verified With: Wu RN 11:55 05/19/11  ([email or web address])   Report Status 05/19/2011  FINAL   Final   CULTURE, SPUTUM-ASSESSMENT     Status: Normal   Collection Time   05/19/11  5:52 PM      Component Value Range Status Comment   Specimen Description SPUTUM   Final    Special Requests Immunocompromised   Final    Sputum evaluation     Final    Value: MICROSCOPIC FINDINGS SUGGEST THAT THIS SPECIMEN IS NOT REPRESENTATIVE OF LOWER RESPIRATORY SECRETIONS. PLEASE RECOLLECT.     CALLED TO RN D.JOHNSON AT 2016 BY L.PITT 0/29/13   Report Status 05/19/2011 FINAL   Final   MRSA PCR SCREENING     Status: Normal   Collection Time   05/21/11  9:50 AM      Component Value Range Status Comment   MRSA by PCR NEGATIVE  NEGATIVE  Final   CULTURE, BLOOD (ROUTINE X 2)     Status: Normal (Preliminary result)   Collection Time   05/24/11 12:40 PM      Component Value Range Status Comment   Specimen Description BLOOD LEFT ARM   Final    Special Requests BOTTLES DRAWN AEROBIC AND ANAEROBIC 10CC   Final    Culture  Setup Time [id number]   Final    Culture     Final    Value:        BLOOD CULTURE RECEIVED NO GROWTH TO DATE CULTURE WILL BE HELD FOR 5 DAYS BEFORE ISSUING A FINAL NEGATIVE REPORT   Report Status PENDING   Incomplete   CULTURE, BLOOD (ROUTINE X 2)     Status: Normal (Preliminary result)   Collection Time   05/24/11 12:40 PM      Component Value Range Status Comment   Specimen Description BLOOD RIGHT WRIST   Final    Special Requests BOTTLES DRAWN AEROBIC AND ANAEROBIC 10CC   Final    Culture  Setup Time [id number]   Final    Culture     Final    Value:        BLOOD CULTURE RECEIVED NO GROWTH TO DATE CULTURE WILL BE HELD FOR 5 DAYS BEFORE ISSUING A FINAL NEGATIVE REPORT   Report Status PENDING   Incomplete   CULTURE, RESPIRATORY     Status: Normal   Collection Time   05/24/11  3:01 PM      Component Value Range Status Comment   Specimen Description TRACHEAL ASPIRATE   Final    Special Requests NONE   Final    Gram Stain     Final    Value:  FEW WBC PRESENT,BOTH PMN AND MONONUCLEAR     NO SQUAMOUS EPITHELIAL CELLS SEEN     NO ORGANISMS SEEN   Culture RARE CANDIDA ALBICANS   Final    Report Status 05/26/2011 FINAL   Final     Studies/Results: Dg Chest Port 1 View  05/27/2011  *RADIOLOGY REPORT*  Clinical Data: Respiratory failure after lumbar decompression surgery.  PORTABLE CHEST - 1 VIEW  Comparison: 05/26/2011  Findings: Endotracheal tube in stable position with tip approximately 3 cm above the carina.  Nasogastric tube extends into the stomach.  Overall pattern of pulmonary edema is relatively stable.  Some residual atelectasis remains at both lung bases.  No significant component of pleural fluid.  Stable cardiomegaly.  IMPRESSION: Stable pulmonary edema.  Original Report Authenticated By: Reola Calkins, M.D.   Dg Chest Port 1 View  05/26/2011  *RADIOLOGY REPORT*  Clinical Data: Assess endotracheal tube.  PORTABLE CHEST -  1 VIEW  Comparison: 05/25/2011.  Findings: Endotracheal tube tip 2.3 cm above the carina.  Left central line tip proximal superior vena cava level. Nasogastric tube courses below the diaphragm.  The tip is not included on this exam.  Diffuse air space disease most notable perihilar region suggestive of pulmonary edema.  No gross pneumothorax.  Cardiomegaly.  Calcified aorta.  IMPRESSION: Pulmonary edema without change.  Original Report Authenticated By: Fuller Canada, M.D.     Assessment/Plan: Renal Txp (MMF, tacrolimus, steroids) Laminectomy 05-16-11 Respiratory failure, intubated 4-3 HCAP?  Vanco/zosyn Day 6 WBC better, continued fever.  Will check CT of spine to try to eval for wound infection, very difficult to discern this recent from surgery. He is at increased risk due to his immunosuppresion.  Check LE dopplers.     John [name] Infectious Diseases [phone] 05/27/2011, 12:17 PM

## 2011-05-27 NOTE — Progress Notes (Signed)
[facility] Progress Note  1. CKD / renal transplant 2002 / Cr 1.1-1.3. Home regimen includes Prograf 5mg  bid, CellCept 500mg  bid and prednisone 5 mg/d . Renal function, creat up 1.48 today. UOP good; he was taking Furosemide 80am and 40pm at home. Tacrolimus level was 5.3ng/ml on 05/23/11. Prograf resumed at prior dose. Agree with holding diuretics given low CVP and worsening azotemia.  If azotemia continues to worsen change tube feed to Nepro. 2. Pulmonary edema/respiratory failure 3. Hypokalemic alkalosis, due to diuretic therapy   4. Hypernatremia-  holding diuretic and giving free water  Subjective:   Intub   Objective:   BP 111/37  Pulse 84  Temp(Src) 99.8 F (37.7 C) (Oral)  Resp 14  Ht 6\' 2"  (1.88 m)  Wt 95.7 kg (210 lb 15.7 oz)  BMI 27.09 kg/m2  SpO2 100%  Intake/Output Summary (Last 24 hours) at 05/27/11 1014 Last data filed at 05/27/11 0600  Gross per 24 hour  Intake 2607.89 ml  Output   2415 ml  Net 192.89 ml   Weight change: 1 kg (2 lb 3.3 oz)  Physical Exam: ZOX:WRUEAV upper extrems CVS:RRR Resp:clear WUJ:WJXB Ext:1+ edema  Imaging: Dg Chest Port 1 View  05/27/2011  *RADIOLOGY REPORT*  Clinical Data: Respiratory failure after lumbar decompression surgery.  PORTABLE CHEST - 1 VIEW  Comparison: 05/26/2011  Findings: Endotracheal tube in stable position with tip approximately 3 cm above the carina.  Nasogastric tube extends into the stomach.  Overall pattern of pulmonary edema is relatively stable.  Some residual atelectasis remains at both lung bases.  No significant component of pleural fluid.  Stable cardiomegaly.  IMPRESSION: Stable pulmonary edema.  Original Report Authenticated By: Reola Calkins, M.D.   Dg Chest Port 1 View  05/26/2011  *RADIOLOGY REPORT*  Clinical Data: Assess endotracheal tube.  PORTABLE CHEST - 1 VIEW  Comparison: 05/25/2011.  Findings: Endotracheal tube tip 2.3 cm above the carina.  Left central line tip proximal  superior vena cava level. Nasogastric tube courses below the diaphragm.  The tip is not included on this exam.  Diffuse air space disease most notable perihilar region suggestive of pulmonary edema.  No gross pneumothorax.  Cardiomegaly.  Calcified aorta.  IMPRESSION: Pulmonary edema without change.  Original Report Authenticated By: Fuller Canada, M.D.    Labs: BMET  Lab 05/27/11 0515 05/26/11 0500 05/25/11 1330 05/25/11 0305 05/24/11 0250 05/23/11 0537 05/22/11 0400 05/21/11 0612  NA 147* 148* 146* 143 141 141 141 --  K 3.6 4.4 4.5 3.2* 3.2* 3.0* 3.9 --  CL 106 107 104 99 95* 95* 99 --  CO2 34* 32 32 35* 37* 35* 34* --  GLUCOSE 418* 249* 180* 154* 129* 194* 222* --  BUN 59* 52* 42* 46* 54* 65* 63* --  CREATININE 1.48* 1.55* 1.37* 1.43* 1.47* 1.54* 1.46* --  ALB -- -- -- -- -- -- -- --  CALCIUM 9.7 9.9 9.8 9.8 9.9 10.0 10.1 --  PHOS 2.5 3.3 -- 2.8 2.4 3.3 -- 3.9   CBC  Lab 05/27/11 0515 05/26/11 0500 05/25/11 0305 05/24/11 0250  WBC 13.2* 14.3* 15.1* 12.3*  NEUTROABS -- -- -- 9.1*  HGB 8.8* 9.4* 9.9* 9.5*  HCT 29.5* 31.6* 31.9* 29.3*  MCV 70.9* 72.3* 69.2* 68.3*  PLT 226 242 259 230    Medications:      . antiseptic oral rinse  1 application Mouth Rinse QID  . aspirin  81 mg Per Tube Daily  . calcium carbonate  2 tablet Oral Daily  . carbidopa-levodopa  1 tablet Oral TID  . chlorhexidine  15 mL Mouth/Throat BID  . docusate  100 mg Per Tube BID  . feeding supplement  30 mL Per Tube TID  . free water  200 mL Per Tube Q6H  . hydrocortisone sod succinate (SOLU-CORTEF) injection  20 mg Intravenous Daily  . insulin aspart  0-20 Units Subcutaneous Q4H  . insulin glargine  12 Units Subcutaneous Daily  . ipratropium  0.5 mg Nebulization QID  . lamoTRIgine  200 mg Oral Daily  . levalbuterol  0.63 mg Nebulization QID  . linagliptin  5 mg Oral Daily  . mycophenolate (CELLCEPT) IVPB  500 mg Intravenous Q12H  . pantoprazole sodium  40 mg Per Tube Q1200  . piperacillin-tazobactam  (ZOSYN)  IV  3.375 g Intravenous Q8H  . pyridostigmine  30 mg Oral TID  . senna  1 tablet Oral BID  . tacrolimus  5 mg Oral BID  . Tamsulosin HCl  0.4 mg Oral Daily  . vancomycin  1,250 mg Intravenous Q24H  . DISCONTD: pantoprazole (PROTONIX) IV  40 mg Intravenous Q24H     05/27/2011, 10:14 AM

## 2011-05-27 NOTE — Progress Notes (Signed)
HPI: 71 yo male with hx COPD, renal transplant (2002), DM on insulin pump who presented 3/26 for lumbar laminotomies and decompression L5 and S1 r/t spondylosis and herniated nucleus pulposus. Post op developed episodes of confusion and respiratory distress thought r/t CHF Which initially improved with lasix. On 3/31 developed worsening respiratory distress and hypoxia and PCCM consulted.   Antibiotics:  Vanc 3/31>>>  Zosyn 3/31>>>   Cultures/Sepsis Markers:   Urine 3/29>>> MORGANELLA MORGANII (6,000 COLONIES/ML)  BCx2 3/28>>> negative  Sputum 3/31>>> NTD Pct 3/31>>> NTD  Access/Protocols:  L IJ CVL>> 3/31  ET tube>> 4/3  Best Practice:  DVT: SCD  GI: Pantoprazole, Colace   Subjective: Patient laying in bed, NAD, no new complaints.   Physical Exam: Filed Vitals:   05/27/11 1210  BP: 117/43  Pulse: 91  Temp:   Resp:     Intake/Output Summary (Last 24 hours) at 05/27/11 1312 Last data filed at 05/27/11 1100  Gross per 24 hour  Intake 2285.89 ml  Output   1340 ml  Net 945.89 ml   Vent Mode:  [-] CPAP;PSV FiO2 (%):  [40 %] 40 % Set Rate:  [12 bmp] 12 bmp Vt Set:  [580 mL] 580 mL PEEP:  [5 cmH20] 5 cmH20 Pressure Support:  [15 cmH20] 15 cmH20 Plateau Pressure:  [20 cmH20-36 cmH20] 36 cmH20  General: chronically ill appearing male, AOx1. Neuro: awake, confused, MAE, gen weakness.  CV: irregularly irregular, no m/r/g.  PULM: resps even, mildly labored, diminished throughout with few scattered ronchi.  GI: abd soft, +bs.  Extremities: Warm and dry, trace BLE edema.   Labs: CBC:    Component Value Date/Time   WBC 13.2* 05/27/2011 0515   HGB 8.8* 05/27/2011 0515   HCT 29.5* 05/27/2011 0515   PLT 226 05/27/2011 0515   MCV 70.9* 05/27/2011 0515   NEUTROABS 9.1* 05/24/2011 0250   LYMPHSABS 1.7 05/24/2011 0250   MONOABS 1.3* 05/24/2011 0250   EOSABS 0.5 05/24/2011 0250   BASOSABS 0.0 05/24/2011 0250   Basic Metabolic Panel:    Component Value Date/Time   NA 147* 05/27/2011 0515    K 3.6 05/27/2011 0515   CL 106 05/27/2011 0515   CO2 34* 05/27/2011 0515   BUN 59* 05/27/2011 0515   CREATININE 1.48* 05/27/2011 0515   GLUCOSE 418* 05/27/2011 0515   CALCIUM 9.7 05/27/2011 0515   Comprehensive Metabolic Panel:    Component Value Date/Time   NA 147* 05/27/2011 0515   K 3.6 05/27/2011 0515   CL 106 05/27/2011 0515   CO2 34* 05/27/2011 0515   BUN 59* 05/27/2011 0515   CREATININE 1.48* 05/27/2011 0515   GLUCOSE 418* 05/27/2011 0515   CALCIUM 9.7 05/27/2011 0515   AST 16 05/20/2011 0836   ALT 6 05/20/2011 0836   ALKPHOS 121* 05/20/2011 0836   BILITOT 0.8 05/20/2011 0836   PROT 6.3 05/20/2011 0836   ALBUMIN 2.6* 05/24/2011 0250   ABG    Component Value Date/Time   PHART 7.480* 05/27/2011 0358   PCO2ART 44.9 05/27/2011 0358   PO2ART 115.0* 05/27/2011 0358   HCO3 33.1* 05/27/2011 0358   TCO2 34.4 05/27/2011 0358   ACIDBASEDEF 1.0 04/22/2009 0905   O2SAT 98.8 05/27/2011 0358    Lab 05/27/11 0515  MG 2.3   Lab Results  Component Value Date   CALCIUM 9.7 05/27/2011   PHOS 2.5 05/27/2011    Assessment & Plan:  Acute respiratory failure  Likely multifactorial in setting volume overload with underlying COPD +/- HCAP post  lumbar surgery in immunosuppressed patient. Patient is afebrile without WBC elevation. CXR shows diffuse edema without clear focal infiltrate, intubated 4/3 with respiratory distress. Currently on full vent support. CVP 2-4 today with persistent edema on CXR, suggesting a non-cardiogenic pulmonary edema vs ARDS, more likely the later. - Hold Lasix. - Free water only no IVF. - Continue PS. - Continue Vanc and Zosyn. - CT of the chest to evaluate lung parenchyma. - With ARDS becoming more likely here, the prognosis is obviously worse and that was relayed to his family.  DM  home on insulin pump.  - Hold insulin pump while critically ill.  - Lantus and SSI.  HTN  - Continue to monitor  Chronic renal insufficiency s/p renal transplant 2002  Renal following.  - Cont cellcept, prograf.   - Hold diuresis as above.  Best practices / Disposition  -->ICU status under PCCM  -->full code  -->SCD's for DVT Px  -->Protonix and Colace for GI Px  CC time 45 minutes.  Koren Bound, M.D. [phone]

## 2011-05-28 ENCOUNTER — Inpatient Hospital Stay (HOSPITAL_COMMUNITY): Payer: Medicare Other

## 2011-05-28 LAB — BLOOD GAS, ARTERIAL
Bicarbonate: 32.2 mEq/L — ABNORMAL HIGH (ref 20.0–24.0)
TCO2: 33.6 mmol/L (ref 0–100)
pCO2 arterial: 47.2 mmHg — ABNORMAL HIGH (ref 35.0–45.0)
pH, Arterial: 7.449 (ref 7.350–7.450)
pO2, Arterial: 140 mmHg — ABNORMAL HIGH (ref 80.0–100.0)

## 2011-05-28 LAB — BASIC METABOLIC PANEL
CO2: 34 mEq/L — ABNORMAL HIGH (ref 19–32)
Calcium: 9.8 mg/dL (ref 8.4–10.5)
GFR calc Af Amer: 54 mL/min — ABNORMAL LOW (ref >90)
GFR calc non Af Amer: 47 mL/min — ABNORMAL LOW (ref >90)
Sodium: 150 mEq/L — ABNORMAL HIGH (ref 135–145)

## 2011-05-28 LAB — CBC
MCH: 21.6 pg — ABNORMAL LOW (ref 26.0–34.0)
MCHC: 29.9 g/dL — ABNORMAL LOW (ref 30.0–36.0)
Platelets: 265 10*3/uL (ref 150–400)
RBC: 4.03 MIL/uL — ABNORMAL LOW (ref 4.22–5.81)
RDW: 19.3 % — ABNORMAL HIGH (ref 11.5–15.5)

## 2011-05-28 LAB — GLUCOSE, CAPILLARY
Glucose-Capillary: 341 mg/dL — ABNORMAL HIGH (ref 70–99)
Glucose-Capillary: 301 mg/dL — ABNORMAL HIGH (ref 70–99)
Glucose-Capillary: 460 mg/dL — ABNORMAL HIGH (ref 70–99)

## 2011-05-28 LAB — PHOSPHORUS: Phosphorus: 2.4 mg/dL (ref 2.3–4.6)

## 2011-05-28 LAB — MAGNESIUM: Magnesium: 2.6 mg/dL — ABNORMAL HIGH (ref 1.5–2.5)

## 2011-05-28 MED ORDER — SODIUM CHLORIDE 0.9 % IV SOLN
100.0000 mg | Freq: Every day | INTRAVENOUS | Status: DC
  Administered 2011-05-28 – 2011-05-31 (×4): 100 mg via INTRAVENOUS
  Filled 2011-05-28 (×6): qty 100

## 2011-05-28 MED ORDER — INSULIN GLARGINE 100 UNIT/ML ~~LOC~~ SOLN
12.0000 [IU] | Freq: Two times a day (BID) | SUBCUTANEOUS | Status: DC
  Administered 2011-05-28 (×2): 12 [IU] via SUBCUTANEOUS

## 2011-05-28 MED ORDER — VANCOMYCIN HCL 1000 MG IV SOLR
750.0000 mg | Freq: Two times a day (BID) | INTRAVENOUS | Status: DC
  Administered 2011-05-29 – 2011-05-31 (×5): 750 mg via INTRAVENOUS
  Filled 2011-05-28 (×6): qty 750

## 2011-05-28 MED ORDER — SODIUM CHLORIDE 0.45 % IV SOLN
INTRAVENOUS | Status: DC
  Administered 2011-05-28 – 2011-05-31 (×2): via INTRAVENOUS

## 2011-05-28 MED ORDER — FREE WATER
200.0000 mL | Freq: Every day | Status: DC
  Administered 2011-05-28 – 2011-05-29 (×5): 200 mL

## 2011-05-28 MED ORDER — INSULIN GLARGINE 100 UNIT/ML ~~LOC~~ SOLN: 12.0000 [IU] | Freq: Two times a day (BID) | SUBCUTANEOUS | Status: DC

## 2011-05-28 MED ORDER — INSULIN GLARGINE 100 UNIT/ML ~~LOC~~ SOLN: 24.0000 [IU] | Freq: Two times a day (BID) | SUBCUTANEOUS | Status: DC

## 2011-05-28 NOTE — Progress Notes (Signed)
[facility] Progress Note  1. CKD / renal transplant 2002 / Cr 1.1-1.3. S/P decompressive laminectomies w/ cx post op course. Home regimen includes Prograf 5mg  bid, CellCept 500mg  bid and prednisone 5 mg/d . Renal function, creat up 1.45 today. UOP good; he was taking Furosemide 80am and 40pm at home. Tacrolimus level was 5.3ng/ml on 05/23/11. Prograf resumed at prior dose. Repeat level 3.5. Agree with holding diuretics given low CVP and worsening azotemia. If azotemia continues to worsen change tube feed to Nepro and lower protein supplement 2. Bilateral pleural effusions by CT scan/respiratory failure(not amenable to thoracentis per Dr. Molli Knock) 3. Hypokalemic alkalosis, due to diuretic therapy  4. Hypernatremia- holding diuretic and giving free water, changed diluents to 1/2NS 5. Uncontrolled Diabetes 6. Fever ?  Subjective:   sedated   Objective:   BP 127/47  Pulse 99  Temp(Src) 98.3 F (36.8 C) (Oral)  Resp 15  Ht 6\' 2"  (1.88 m)  Wt 97.2 kg (214 lb 4.6 oz)  BMI 27.51 kg/m2  SpO2 100%  Intake/Output Summary (Last 24 hours) at 05/28/11 1206 Last data filed at 05/28/11 1100  Gross per 24 hour  Intake 3440.78 ml  Output   1975 ml  Net 1465.78 ml   Weight change: 1.5 kg (3 lb 4.9 oz)  Physical Exam: ZOX:WRUE/AVWUJWJ CVS:RRR Resp:decreased BS XBJ:YNWG Ext:1+ edema  Imaging: Ct Chest Wo Contrast  05/27/2011  *RADIOLOGY REPORT*  Clinical Data: Pneumonia  CT CHEST WITHOUT CONTRAST  Technique:  Multidetector CT imaging of the chest was performed following the standard protocol without IV contrast.  Comparison: Chest x-ray same day  Findings: Images of the thoracic inlet are unremarkable. Endotracheal and NG tube in place noted.  The patient is status post median sternotomy.  Atherosclerotic calcifications of the coronary arteries.  NG tube tip is within the distal stomach.  There is no mediastinal hematoma or adenopathy.  Atherosclerotic calcifications of thoracic  aorta.  Sagittal images of the spine shows mild degenerative changes thoracic spine.  There is small left pleural effusion.  Moderate sized right pleural effusion.  There is bilateral lower lobe posterior atelectasis or infiltrate right greater than left.  No convincing pulmonary edema.  Mild emphysematous changes are noted bilateral upper lobe.  There are some punctate calcifications in the right lower lobe peripherally.  This may represent calcifications of the visceral pleura or prior granulomatous disease.  Calcified right hilar lymph nodes are noted.  The visualized upper abdomen shows a small punctate calcification within spleen probable from prior granulomatous disease.  The visualized pancreas is unremarkable.  Atherosclerotic calcifications of the splenic artery.  IMPRESSION:  1.  Endotracheal and NG tube in place.  Borderline cardiomegaly. 2.  Small left pleural effusion.  Moderate sized right pleural effusion.  Bilateral lower lobe posterior atelectasis or infiltrate. 3.  Mild emphysematous changes bilateral upper lobe. No convincing pulmonary edema.   4. No mediastinal hematoma or adenopathy.  Calcified right hilar lymph node.  Original Report Authenticated By: Natasha Mead, M.D.   Ct Lumbar Spine Wo Contrast  05/27/2011  *RADIOLOGY REPORT*  Clinical Data: Evaluate for wound infection.  Laminectomy 05/16/2011  CT LUMBAR SPINE WITHOUT CONTRAST  Technique:  Multidetector CT imaging of the lumbar spine was performed without intravenous contrast administration. Multiplanar CT image reconstructions were also generated.  Comparison: Lumbar MRI 04/13/2011  Findings: Negative for fracture or mass lesion.  Disc degeneration is present at L5-S1 with disc space narrowing.  Endplate sclerosis and cystic changes are similar to the  prior MRI and most likely related to disc degeneration.  No erosive changes are seen suggesting diskitis and osteomyelitis.  Atherosclerotic aorta without aneurysm.  L1-2:  Mild disc and  facet degeneration without spinal stenosis.  L2-3:  Mild disc and facet degeneration without significant stenosis.  L3-4:  Mild disc and facet degeneration without significant spinal stenosis.  L4-5:  Mild disc and facet degeneration without significant spinal stenosis.  L5-S1:  Bilateral laminotomy.  No bony erosion is seen.  No significant soft tissue edema or fluid collection is present. Abscess would be better identified by MRI but I see no findings suggestive of deep tissue infection or abscess.  Transplant kidney in the right pelvis.  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  Prior surgery at L5-S1.  There is spondylosis and foraminal narrowing bilaterally at this level.  No evidence of soft tissue edema or abscess.  Original Report Authenticated By: Camelia Phenes, M.D.   Dg Chest Port 1 View  05/28/2011  *RADIOLOGY REPORT*  Clinical Data: evaluate ET tube placement  PORTABLE CHEST - 1 VIEW  Comparison: 05/27/2011  Findings:  There is a left IJ catheter with tip in the projection of the SVC.  There is an ET tube with tip above the carina.  Heart size appears normal.  Diffuse interstitial edema pattern and right pleural effusion are unchanged from previous exam.  IMPRESSION:  1.  No significant change in CHF pattern compared with previous exam.  Original Report Authenticated By: Rosealee Albee, M.D.   Dg Chest Port 1 View  05/27/2011  *RADIOLOGY REPORT*  Clinical Data: Respiratory failure after lumbar decompression surgery.  PORTABLE CHEST - 1 VIEW  Comparison: 05/26/2011  Findings: Endotracheal tube in stable position with tip approximately 3 cm above the carina.  Nasogastric tube extends into the stomach.  Overall pattern of pulmonary edema is relatively stable.  Some residual atelectasis remains at both lung bases.  No significant component of pleural fluid.  Stable cardiomegaly.  IMPRESSION: Stable pulmonary edema.  Original Report Authenticated By: Reola Calkins, M.D.     Labs: BMET  Lab 05/28/11 0530 05/27/11 0515 05/26/11 0500 05/25/11 1330 05/25/11 0305 05/24/11 0250 05/23/11 0537  NA 150* 147* 148* 146* 143 141 141  K 3.7 3.6 4.4 4.5 3.2* 3.2* 3.0*  CL 109 106 107 104 99 95* 95*  CO2 34* 34* 32 32 35* 37* 35*  GLUCOSE 341* 418* 249* 180* 154* 129* 194*  BUN 60* 59* 52* 42* 46* 54* 65*  CREATININE 1.45* 1.48* 1.55* 1.37* 1.43* 1.47* 1.54*  ALB -- -- -- -- -- -- --  CALCIUM 9.8 9.7 9.9 9.8 9.8 9.9 10.0  PHOS 2.4 2.5 3.3 -- 2.8 2.4 3.3   CBC  Lab 05/28/11 0530 05/27/11 0515 05/26/11 0500 05/25/11 0305 05/24/11 0250  WBC 11.6* 13.2* 14.3* 15.1* --  NEUTROABS -- -- -- -- 9.1*  HGB 8.7* 8.8* 9.4* 9.9* --  HCT 29.1* 29.5* 31.6* 31.9* --  MCV 72.2* 70.9* 72.3* 69.2* --  PLT 265 226 242 259 --    Medications:      . antiseptic oral rinse  1 application Mouth Rinse QID  . aspirin  81 mg Per Tube Daily  . calcium carbonate  2 tablet Oral Daily  . carbidopa-levodopa  1 tablet Oral TID  . chlorhexidine  15 mL Mouth/Throat BID  . docusate  100 mg Per Tube BID  . feeding supplement  30 mL Per Tube TID  . free  water  200 mL Per Tube Q6H  . hydrocortisone sod succinate (SOLU-CORTEF) injection  20 mg Intravenous Daily  . insulin aspart  0-20 Units Subcutaneous Q4H  . insulin glargine  12 Units Subcutaneous Once  . insulin glargine  24 Units Subcutaneous Q24H  . ipratropium  0.5 mg Nebulization QID  . lamoTRIgine  200 mg Oral Daily  . levalbuterol  0.63 mg Nebulization QID  . linagliptin  5 mg Oral Daily  . micafungin [facility]) IV  100 mg Intravenous Daily  . mycophenolate (CELLCEPT) IVPB  500 mg Intravenous Q12H  . pantoprazole sodium  40 mg Per Tube Q1200  . piperacillin-tazobactam (ZOSYN)  IV  3.375 g Intravenous Q8H  . pyridostigmine  30 mg Oral TID  . senna  1 tablet Oral BID  . tacrolimus  5 mg Oral BID  . Tamsulosin HCl  0.4 mg Oral Daily  . vancomycin  1,250 mg Intravenous Q24H  . DISCONTD: insulin glargine  12 Units Subcutaneous  Daily  . DISCONTD: insulin glargine  24 Units Subcutaneous Daily

## 2011-05-28 NOTE — Progress Notes (Signed)
INFECTIOUS DISEASE PROGRESS NOTE  ID: John [name] is a 71 y.o. male with   Principal Problem:  *Herniated nucleus pulposus Active Problems:  HYPERLIPIDEMIA-MIXED  Coronary atherosclerosis of native coronary artery  CORONARY ATHEROSLERO UNSPEC TYPE BYPASS GRAFT  Atrial fibrillation  Chronic diastolic heart failure  PVD WITH CLAUDICATION  Chronic kidney disease  COPD (chronic obstructive pulmonary disease)  First degree AV block  Spondylosis  Diastolic CHF, acute on chronic  DM (diabetes mellitus), type 2  Insulin pump in place  Altered mental status  Acute respiratory failure  Pulmonary edema  Hypoxemia  Subjective: On vent. Was able to do 30-45 minutes breathing trial today. NL BM overnight.  Abtx:  Anti-infectives     Start     Dose/Rate Route Frequency Ordered Stop   05/24/11 2200   vancomycin (VANCOCIN) 1,250 mg in sodium chloride 0.9 % 250 mL IVPB        1,250 mg 166.7 mL/hr over 90 Minutes Intravenous Every 24 hours 05/24/11 1922     05/24/11 1800   vancomycin (VANCOCIN) 1,250 mg in sodium chloride 0.9 % 250 mL IVPB  Status:  Discontinued        1,250 mg 166.7 mL/hr over 90 Minutes Intravenous Every 24 hours 05/24/11 0141 05/24/11 0940   05/21/11 1000  piperacillin-tazobactam (ZOSYN) IVPB 3.375 g       3.375 g 12.5 mL/hr over 240 Minutes Intravenous Every 8 hours 05/21/11 0855     05/21/11 1000   vancomycin (VANCOCIN) IVPB 1000 mg/200 mL premix  Status:  Discontinued        1,000 mg 200 mL/hr over 60 Minutes Intravenous Every 12 hours 05/21/11 0855 05/23/11 1101   05/16/11 0932   bacitracin 50,000 Units in sodium chloride irrigation 0.9 % 500 mL irrigation  Status:  Discontinued          As needed 05/16/11 0932 05/16/11 1055   05/16/11 0700   bacitracin 16109 UNITS injection     Comments: Imagene Gurney: cabinet override         05/16/11 0700 05/16/11 1914   05/16/11 0000   ceFAZolin (ANCEF) IVPB 2 g/50 mL premix        2 g 100 mL/hr over 30 Minutes  Intravenous 60 min pre-op 05/15/11 1449 05/16/11 0828          Medications:  Scheduled:   . antiseptic oral rinse  1 application Mouth Rinse QID  . aspirin  81 mg Per Tube Daily  . calcium carbonate  2 tablet Oral Daily  . carbidopa-levodopa  1 tablet Oral TID  . chlorhexidine  15 mL Mouth/Throat BID  . docusate  100 mg Per Tube BID  . feeding supplement  30 mL Per Tube TID  . free water  200 mL Per Tube Q6H  . hydrocortisone sod succinate (SOLU-CORTEF) injection  20 mg Intravenous Daily  . insulin aspart  0-20 Units Subcutaneous Q4H  . insulin glargine  12 Units Subcutaneous Once  . insulin glargine  24 Units Subcutaneous Q24H  . ipratropium  0.5 mg Nebulization QID  . lamoTRIgine  200 mg Oral Daily  . levalbuterol  0.63 mg Nebulization QID  . linagliptin  5 mg Oral Daily  . mycophenolate (CELLCEPT) IVPB  500 mg Intravenous Q12H  . pantoprazole sodium  40 mg Per Tube Q1200  . piperacillin-tazobactam (ZOSYN)  IV  3.375 g Intravenous Q8H  . pyridostigmine  30 mg Oral TID  . senna  1 tablet Oral BID  . tacrolimus  5 mg Oral BID  . Tamsulosin HCl  0.4 mg Oral Daily  . vancomycin  1,250 mg Intravenous Q24H  . DISCONTD: insulin glargine  12 Units Subcutaneous Daily  . DISCONTD: insulin glargine  24 Units Subcutaneous Daily    Objective: Vital signs in last 24 hours: Temp:  [98.3 F (36.8 C)-102 F (38.9 C)] 98.3 F (36.8 C) (04/07 0800) Pulse Rate:  [79-101] 99  (04/07 0900) Resp:  [12-32] 15  (04/07 0900) BP: (102-160)/(38-88) 127/47 mmHg (04/07 0845) SpO2:  [97 %-100 %] 100 % (04/07 0900) FiO2 (%):  [40 %] 40 % (04/07 0845) Weight:  [97.2 kg (214 lb 4.6 oz)] 97.2 kg (214 lb 4.6 oz) (04/07 0600)   Resp: rhonchi bilaterally Cardio: tachycardia GI: normal findings: bowel sounds normal and soft, non-tender Extremities: edema trace  Lab Results  Basename 05/28/11 0530 05/27/11 0515  WBC 11.6* 13.2*  HGB 8.7* 8.8*  HCT 29.1* 29.5*  NA 150* 147*  K 3.7 3.6  CL  109 106  CO2 34* 34*  BUN 60* 59*  CREATININE 1.45* 1.48*  GLU -- --   Liver Panel No results found for this basename: PROT:2,ALBUMIN:2,AST:2,ALT:2,ALKPHOS:2,BILITOT:2,BILIDIR:2,IBILI:2 in the last 72 hours Sedimentation Rate No results found for this basename: ESRSEDRATE in the last 72 hours C-Reactive Protein No results found for this basename: CRP:2 in the last 72 hours  Microbiology: Recent Results (from the past 240 hour(s))  CULTURE, BLOOD (ROUTINE X 2)     Status: Normal   Collection Time   05/18/11  6:45 PM      Component Value Range Status Comment   Specimen Description BLOOD LEFT ARM   Final    Special Requests BOTTLES DRAWN AEROBIC ONLY 10CC   Final    Culture  Setup Time [id number]   Final    Culture NO GROWTH 5 DAYS   Final    Report Status 05/25/2011 FINAL   Final   CULTURE, BLOOD (ROUTINE X 2)     Status: Normal   Collection Time   05/18/11  6:50 PM      Component Value Range Status Comment   Specimen Description BLOOD RIGHT HAND   Final    Special Requests BOTTLES DRAWN AEROBIC ONLY 10CC   Final    Culture  Setup Time [id number]   Final    Culture NO GROWTH 5 DAYS   Final    Report Status 05/25/2011 FINAL   Final   URINE CULTURE     Status: Normal   Collection Time   05/19/11 10:33 AM      Component Value Range Status Comment   Specimen Description URINE, CATHETERIZED   Final    Special Requests CONDOM   Final    Culture  Setup Time [id number]   Final    Colony Count 6,000 COLONIES/ML   Final    Culture [facility] MORGANII   Final    Report Status 05/22/2011 FINAL   Final    Organism ID, Bacteria MORGANELLA MORGANII   Final   CULTURE, SPUTUM-ASSESSMENT     Status: Normal   Collection Time   05/19/11 11:16 AM      Component Value Range Status Comment   Specimen Description SPUTUM   Final    Special Requests NONE   Final    Sputum evaluation     Final    Value: MICROSCOPIC FINDINGS SUGGEST THAT THIS SPECIMEN IS NOT REPRESENTATIVE OF LOWER RESPIRATORY  SECRETIONS. PLEASE RECOLLECT.     Gram Stain Report Called  to,Read Back By and Verified With: Wu RN 11:55 05/19/11 ([email or web address])   Report Status 05/19/2011 FINAL   Final   CULTURE, SPUTUM-ASSESSMENT     Status: Normal   Collection Time   05/19/11  5:52 PM      Component Value Range Status Comment   Specimen Description SPUTUM   Final    Special Requests Immunocompromised   Final    Sputum evaluation     Final    Value: MICROSCOPIC FINDINGS SUGGEST THAT THIS SPECIMEN IS NOT REPRESENTATIVE OF LOWER RESPIRATORY SECRETIONS. PLEASE RECOLLECT.     CALLED TO RN D.JOHNSON AT 2016 BY L.PITT 0/29/13   Report Status 05/19/2011 FINAL   Final   MRSA PCR SCREENING     Status: Normal   Collection Time   05/21/11  9:50 AM      Component Value Range Status Comment   MRSA by PCR NEGATIVE  NEGATIVE  Final   CULTURE, BLOOD (ROUTINE X 2)     Status: Normal (Preliminary result)   Collection Time   05/24/11 12:40 PM      Component Value Range Status Comment   Specimen Description BLOOD LEFT ARM   Final    Special Requests BOTTLES DRAWN AEROBIC AND ANAEROBIC 10CC   Final    Culture  Setup Time [id number]   Final    Culture     Final    Value:        BLOOD CULTURE RECEIVED NO GROWTH TO DATE CULTURE WILL BE HELD FOR 5 DAYS BEFORE ISSUING A FINAL NEGATIVE REPORT   Report Status PENDING   Incomplete   CULTURE, BLOOD (ROUTINE X 2)     Status: Normal (Preliminary result)   Collection Time   05/24/11 12:40 PM      Component Value Range Status Comment   Specimen Description BLOOD RIGHT WRIST   Final    Special Requests BOTTLES DRAWN AEROBIC AND ANAEROBIC 10CC   Final    Culture  Setup Time [id number]   Final    Culture     Final    Value:        BLOOD CULTURE RECEIVED NO GROWTH TO DATE CULTURE WILL BE HELD FOR 5 DAYS BEFORE ISSUING A FINAL NEGATIVE REPORT   Report Status PENDING   Incomplete   CULTURE, RESPIRATORY     Status: Normal   Collection Time   05/24/11  3:01 PM      Component Value Range Status Comment    Specimen Description TRACHEAL ASPIRATE   Final    Special Requests NONE   Final    Gram Stain     Final    Value: FEW WBC PRESENT,BOTH PMN AND MONONUCLEAR     NO SQUAMOUS EPITHELIAL CELLS SEEN     NO ORGANISMS SEEN   Culture RARE CANDIDA ALBICANS   Final    Report Status 05/26/2011 FINAL   Final   CULTURE, BLOOD (ROUTINE X 2)     Status: Normal (Preliminary result)   Collection Time   05/26/11  5:50 PM      Component Value Range Status Comment   Specimen Description BLOOD LEFT HAND   Final    Special Requests BOTTLES DRAWN AEROBIC ONLY 3CC   Final    Culture  Setup Time [id number]   Final    Culture     Final    Value:        BLOOD CULTURE RECEIVED NO GROWTH TO DATE CULTURE WILL BE HELD FOR 5 DAYS  BEFORE ISSUING A FINAL NEGATIVE REPORT   Report Status PENDING   Incomplete   CULTURE, BLOOD (ROUTINE X 2)     Status: Normal (Preliminary result)   Collection Time   05/26/11  6:00 PM      Component Value Range Status Comment   Specimen Description BLOOD LEFT WRIST   Final    Special Requests     Final    Value: BOTTLES DRAWN AEROBIC AND ANAEROBIC 7CC AER 4CC ANA   Culture  Setup Time [id number]   Final    Culture     Final    Value:        BLOOD CULTURE RECEIVED NO GROWTH TO DATE CULTURE WILL BE HELD FOR 5 DAYS BEFORE ISSUING A FINAL NEGATIVE REPORT   Report Status PENDING   Incomplete     Studies/Results: Ct Chest Wo Contrast  05/27/2011  *RADIOLOGY REPORT*  Clinical Data: Pneumonia  CT CHEST WITHOUT CONTRAST  Technique:  Multidetector CT imaging of the chest was performed following the standard protocol without IV contrast.  Comparison: Chest x-ray same day  Findings: Images of the thoracic inlet are unremarkable. Endotracheal and NG tube in place noted.  The patient is status post median sternotomy.  Atherosclerotic calcifications of the coronary arteries.  NG tube tip is within the distal stomach.  There is no mediastinal hematoma or adenopathy.  Atherosclerotic calcifications of  thoracic aorta.  Sagittal images of the spine shows mild degenerative changes thoracic spine.  There is small left pleural effusion.  Moderate sized right pleural effusion.  There is bilateral lower lobe posterior atelectasis or infiltrate right greater than left.  No convincing pulmonary edema.  Mild emphysematous changes are noted bilateral upper lobe.  There are some punctate calcifications in the right lower lobe peripherally.  This may represent calcifications of the visceral pleura or prior granulomatous disease.  Calcified right hilar lymph nodes are noted.  The visualized upper abdomen shows a small punctate calcification within spleen probable from prior granulomatous disease.  The visualized pancreas is unremarkable.  Atherosclerotic calcifications of the splenic artery.  IMPRESSION:  1.  Endotracheal and NG tube in place.  Borderline cardiomegaly. 2.  Small left pleural effusion.  Moderate sized right pleural effusion.  Bilateral lower lobe posterior atelectasis or infiltrate. 3.  Mild emphysematous changes bilateral upper lobe. No convincing pulmonary edema.   4. No mediastinal hematoma or adenopathy.  Calcified right hilar lymph node.  Original Report Authenticated By: Natasha Mead, M.D.   Ct Lumbar Spine Wo Contrast  05/27/2011  *RADIOLOGY REPORT*  Clinical Data: Evaluate for wound infection.  Laminectomy 05/16/2011  CT LUMBAR SPINE WITHOUT CONTRAST  Technique:  Multidetector CT imaging of the lumbar spine was performed without intravenous contrast administration. Multiplanar CT image reconstructions were also generated.  Comparison: Lumbar MRI 04/13/2011  Findings: Negative for fracture or mass lesion.  Disc degeneration is present at L5-S1 with disc space narrowing.  Endplate sclerosis and cystic changes are similar to the prior MRI and most likely related to disc degeneration.  No erosive changes are seen suggesting diskitis and osteomyelitis.  Atherosclerotic aorta without aneurysm.  L1-2:  Mild  disc and facet degeneration without spinal stenosis.  L2-3:  Mild disc and facet degeneration without significant stenosis.  L3-4:  Mild disc and facet degeneration without significant spinal stenosis.  L4-5:  Mild disc and facet degeneration without significant spinal stenosis.  L5-S1:  Bilateral laminotomy.  No bony erosion is seen.  No significant soft tissue edema or  fluid collection is present. Abscess would be better identified by MRI but I see no findings suggestive of deep tissue infection or abscess.  Transplant kidney in the right pelvis.  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  Prior surgery at L5-S1.  There is spondylosis and foraminal narrowing bilaterally at this level.  No evidence of soft tissue edema or abscess.  Original Report Authenticated By: Camelia Phenes, M.D.   Dg Chest Port 1 View  05/28/2011  *RADIOLOGY REPORT*  Clinical Data: evaluate ET tube placement  PORTABLE CHEST - 1 VIEW  Comparison: 05/27/2011  Findings:  There is a left IJ catheter with tip in the projection of the SVC.  There is an ET tube with tip above the carina.  Heart size appears normal.  Diffuse interstitial edema pattern and right pleural effusion are unchanged from previous exam.  IMPRESSION:  1.  No significant change in CHF pattern compared with previous exam.  Original Report Authenticated By: Rosealee Albee, M.D.   Dg Chest Port 1 View  05/27/2011  *RADIOLOGY REPORT*  Clinical Data: Respiratory failure after lumbar decompression surgery.  PORTABLE CHEST - 1 VIEW  Comparison: 05/26/2011  Findings: Endotracheal tube in stable position with tip approximately 3 cm above the carina.  Nasogastric tube extends into the stomach.  Overall pattern of pulmonary edema is relatively stable.  Some residual atelectasis remains at both lung bases.  No significant component of pleural fluid.  Stable cardiomegaly.  IMPRESSION: Stable pulmonary edema.  Original Report Authenticated By: Reola Calkins, M.D.     Assessment/Plan: Renal Txp (MMF, tacrolimus, steroids)  Laminectomy 05-16-11  Respiratory failure, intubated 4-3  HCAP?  UCx 6k M morgagni (S-zosyn).  Vanco/zosyn Day 7  WBC better, continued fever.  LE dopplers (-).  CT chest- BLL infilt/atx. CT neck- no abscess or fluid collection.  Will adding fungal coverage, watch temp curve.  Would not check BCx more than every 72 hours unless clinical change. Hold on C diff PCR as he has had nl BM in last 24h.    Johny Sax Infectious Diseases [phone] 05/28/2011, 9:56 AM

## 2011-05-28 NOTE — Progress Notes (Signed)
HPI: 71 yo male with hx COPD, renal transplant (2002), DM on insulin pump who presented 3/26 for lumbar laminotomies and decompression L5 and S1 r/t spondylosis and herniated nucleus pulposus. Post op developed episodes of confusion and respiratory distress thought r/t CHF Which initially improved with lasix. On 3/31 developed worsening respiratory distress and hypoxia and PCCM consulted.   Antibiotics:  Vanc 3/31>>>  Zosyn 3/31>>>   Cultures/Sepsis Markers:   Urine 3/29>>> MORGANELLA MORGANII (6,000 COLONIES/ML)  BCx2 3/28>>> negative  Sputum 3/31>>> NTD Pct 3/31>>> NTD  Access/Protocols:  L IJ CVL>> 3/31  ET tube>> 4/3  Best Practice:  DVT: SCD  GI: Pantoprazole, Colace   Subjective: Patient laying in bed, NAD, no new complaints.   Physical Exam: Filed Vitals:   05/28/11 0800  BP:   Pulse:   Temp: 98.3 F (36.8 C)  Resp:     Intake/Output Summary (Last 24 hours) at 05/28/11 0829 Last data filed at 05/28/11 1610  Gross per 24 hour  Intake 3359.33 ml  Output   1975 ml  Net 1384.33 ml   Vent Mode:  [-] CPAP;PSV FiO2 (%):  [40 %] 40 % Set Rate:  [12 bmp] 12 bmp Vt Set:  [580 mL] 580 mL PEEP:  [5 cmH20] 5 cmH20 Pressure Support:  [12 cmH20-15 cmH20] 12 cmH20 Plateau Pressure:  [20 cmH20-24 cmH20] 20 cmH20  General: chronically ill appearing male, AOx1. Neuro: awake, confused, MAE, gen weakness.  CV: irregularly irregular, no m/r/g.  PULM: resps even, mildly labored, diminished throughout with few scattered ronchi.  GI: abd soft, +bs.  Extremities: Warm and dry, trace BLE edema.   Labs: CBC:    Component Value Date/Time   WBC 11.6* 05/28/2011 0530   HGB 8.7* 05/28/2011 0530   HCT 29.1* 05/28/2011 0530   PLT 265 05/28/2011 0530   MCV 72.2* 05/28/2011 0530   NEUTROABS 9.1* 05/24/2011 0250   LYMPHSABS 1.7 05/24/2011 0250   MONOABS 1.3* 05/24/2011 0250   EOSABS 0.5 05/24/2011 0250   BASOSABS 0.0 05/24/2011 0250   Basic Metabolic Panel:    Component Value Date/Time   NA  150* 05/28/2011 0530   K 3.7 05/28/2011 0530   CL 109 05/28/2011 0530   CO2 34* 05/28/2011 0530   BUN 60* 05/28/2011 0530   CREATININE 1.45* 05/28/2011 0530   GLUCOSE 341* 05/28/2011 0530   CALCIUM 9.8 05/28/2011 0530   Comprehensive Metabolic Panel:    Component Value Date/Time   NA 150* 05/28/2011 0530   K 3.7 05/28/2011 0530   CL 109 05/28/2011 0530   CO2 34* 05/28/2011 0530   BUN 60* 05/28/2011 0530   CREATININE 1.45* 05/28/2011 0530   GLUCOSE 341* 05/28/2011 0530   CALCIUM 9.8 05/28/2011 0530   AST 16 05/20/2011 0836   ALT 6 05/20/2011 0836   ALKPHOS 121* 05/20/2011 0836   BILITOT 0.8 05/20/2011 0836   PROT 6.3 05/20/2011 0836   ALBUMIN 2.6* 05/24/2011 0250   ABG    Component Value Date/Time   PHART 7.449 05/28/2011 0505   PCO2ART 47.2* 05/28/2011 0505   PO2ART 140.0* 05/28/2011 0505   HCO3 32.2* 05/28/2011 0505   TCO2 33.6 05/28/2011 0505   ACIDBASEDEF 1.0 04/22/2009 0905   O2SAT 99.4 05/28/2011 0505    Lab 05/28/11 0530  MG 2.6*   Lab Results  Component Value Date   CALCIUM 9.8 05/28/2011   PHOS 2.4 05/28/2011   CT CHEST 05/27/11 IMPRESSION:  1. Endotracheal and NG tube in place. Borderline cardiomegaly.  2. Small  left pleural effusion. Moderate sized right pleural effusion. Bilateral lower lobe posterior atelectasis or infiltrate.  3. Mild emphysematous changes bilateral upper lobe. No convincing pulmonary edema.  4. No mediastinal hematoma or adenopathy. Calcified right hilar lymph node.   Assessment & Plan:  Acute respiratory failure  Likely multifactorial in setting volume overload with underlying COPD +/- HCAP post lumbar surgery in immunosuppressed patient. Patient is afebrile without WBC elevation. CXR shows diffuse edema without clear focal infiltrate, intubated 4/3 with respiratory distress. Currently on full vent support. CVP 7 today with persistent bilateral edema on CXR, suggesting a non-cardiogenic pulmonary edema vs ARDS, more likely the later. CT chest 4/6 shows bilateral infiltrates.  - Hold  Lasix, and target CVP of 4-6 in the setting on likely ARDS - Free water only no IVF. - Continue PS. - Continue Vanc and Zosyn.  DM  home on insulin pump.  - Hold insulin pump while critically ill.  - Lantus and SSI.  HTN  - Continue to monitor  Chronic renal insufficiency s/p renal transplant 2002  Renal following.  - Cont cellcept, prograf.  - Hold diuresis as above.  Best practices / Disposition  -->ICU status under PCCM  -->full code  -->SCD's for DVT Px  -->Protonix and Colace for GI Px  CC time 35 minutes.  TOBBIA,PATRICK, M.D.  CT noted, pulmonary parenchyma is not poor appearing, bilateral pleural effusion.  The patient failing weaning is likely related to deconditioning.  Patient does not have ARDS by PF ratio criteria (PF ratio of 350, not even ALI).  Muscle weakness and deconditioning are the driving force behind his respiratory failure.  Will need to discuss that with the family today as if a trach is not desired then patient is as optimized as he will ever be.  CC time 35 minutes.  Patient seen and examined, agree with above note.  I dictated the care and orders written for this patient under my direction.  Koren Bound, M.D. [phone]

## 2011-05-28 NOTE — Progress Notes (Signed)
WUA per protocol. Additional info ref to flowsheet. Pt became tachycardic and rr 45 to 50.HR 100 bp 127/47 sao2 100%. Sedation and analgesic turned back on and titrated as charted.RT placed pt back on full support until RR rate reduces.Will continue to monitor and advise attending as needed. Family at bedside.

## 2011-05-28 NOTE — Progress Notes (Signed)
Report from Night RN. Chart reviewed together. Handoff complete.  

## 2011-05-28 NOTE — Progress Notes (Signed)
ANTIBIOTIC CONSULT NOTE - Follow-up  Pharmacy Consult for vancomycin Indication: VDRF, poss PNA  No Known Allergies  Patient Measurements: Height: 6\' 2"  (188 cm) Weight: 214 lb 4.6 oz (97.2 kg) IBW/kg (Calculated) : 82.2   Vital Signs: Temp: 101 F (38.3 C) (04/07 1939) Temp src: Oral (04/07 1939) BP: 125/73 mmHg (04/07 2100) Pulse Rate: 88  (04/07 2100) Intake/Output from previous day: 04/06 0701 - 04/07 0700 In: 3577.8 [I.V.:627.8; NG/GT:2200; IV Piggyback:750] Out: 1975 [Urine:1975] Intake/Output from this shift: Total I/O In: 429.5 [I.V.:87; NG/GT:330; IV Piggyback:12.5] Out: 260 [Urine:260]  Labs:  [facility] 05/28/11 0530 05/27/11 0515 05/26/11 0500  WBC 11.6* 13.2* 14.3*  HGB 8.7* 8.8* 9.4*  PLT 265 226 242  LABCREA -- -- --  CREATININE 1.45* 1.48* 1.55*   Estimated Creatinine Clearance: 54.3 ml/min (by C-G formula based on Cr of 1.45).  Basename 05/28/11 2210  VANCOTROUGH 13.5  VANCOPEAK --  VANCORANDOM --  GENTTROUGH --  GENTPEAK --  GENTRANDOM --  TOBRATROUGH --  TOBRAPEAK --  TOBRARND --  AMIKACINPEAK --  AMIKACINTROU --  AMIKACIN --     Microbiology: Recent Results (from the past 720 hour(s))  SURGICAL PCR SCREEN     Status: Normal   Collection Time   05/10/11  1:34 PM      Component Value Range Status Comment   MRSA, PCR NEGATIVE  NEGATIVE  Final    Staphylococcus aureus NEGATIVE  NEGATIVE  Final   CULTURE, BLOOD (ROUTINE X 2)     Status: Normal   Collection Time   05/18/11  6:45 PM      Component Value Range Status Comment   Specimen Description BLOOD LEFT ARM   Final    Special Requests BOTTLES DRAWN AEROBIC ONLY 10CC   Final    Culture  Setup Time [id number]   Final    Culture NO GROWTH 5 DAYS   Final    Report Status 05/25/2011 FINAL   Final   CULTURE, BLOOD (ROUTINE X 2)     Status: Normal   Collection Time   05/18/11  6:50 PM      Component Value Range Status Comment   Specimen Description BLOOD RIGHT HAND   Final    Special  Requests BOTTLES DRAWN AEROBIC ONLY 10CC   Final    Culture  Setup Time [id number]   Final    Culture NO GROWTH 5 DAYS   Final    Report Status 05/25/2011 FINAL   Final   URINE CULTURE     Status: Normal   Collection Time   05/19/11 10:33 AM      Component Value Range Status Comment   Specimen Description URINE, CATHETERIZED   Final    Special Requests CONDOM   Final    Culture  Setup Time [id number]   Final    Colony Count 6,000 COLONIES/ML   Final    Culture [facility] MORGANII   Final    Report Status 05/22/2011 FINAL   Final    Organism ID, Bacteria MORGANELLA MORGANII   Final   CULTURE, SPUTUM-ASSESSMENT     Status: Normal   Collection Time   05/19/11 11:16 AM      Component Value Range Status Comment   Specimen Description SPUTUM   Final    Special Requests NONE   Final    Sputum evaluation     Final    Value: MICROSCOPIC FINDINGS SUGGEST THAT THIS SPECIMEN IS NOT REPRESENTATIVE OF LOWER RESPIRATORY SECRETIONS. PLEASE RECOLLECT.  Gram Stain Report Called to,Read Back By and Verified With: Wu RN 11:55 05/19/11 ([email or web address])   Report Status 05/19/2011 FINAL   Final   CULTURE, SPUTUM-ASSESSMENT     Status: Normal   Collection Time   05/19/11  5:52 PM      Component Value Range Status Comment   Specimen Description SPUTUM   Final    Special Requests Immunocompromised   Final    Sputum evaluation     Final    Value: MICROSCOPIC FINDINGS SUGGEST THAT THIS SPECIMEN IS NOT REPRESENTATIVE OF LOWER RESPIRATORY SECRETIONS. PLEASE RECOLLECT.     CALLED TO RN D.JOHNSON AT 2016 BY L.PITT 0/29/13   Report Status 05/19/2011 FINAL   Final   MRSA PCR SCREENING     Status: Normal   Collection Time   05/21/11  9:50 AM      Component Value Range Status Comment   MRSA by PCR NEGATIVE  NEGATIVE  Final   CULTURE, BLOOD (ROUTINE X 2)     Status: Normal (Preliminary result)   Collection Time   05/24/11 12:40 PM      Component Value Range Status Comment   Specimen Description BLOOD LEFT ARM    Final    Special Requests BOTTLES DRAWN AEROBIC AND ANAEROBIC 10CC   Final    Culture  Setup Time [id number]   Final    Culture     Final    Value:        BLOOD CULTURE RECEIVED NO GROWTH TO DATE CULTURE WILL BE HELD FOR 5 DAYS BEFORE ISSUING A FINAL NEGATIVE REPORT   Report Status PENDING   Incomplete   CULTURE, BLOOD (ROUTINE X 2)     Status: Normal (Preliminary result)   Collection Time   05/24/11 12:40 PM      Component Value Range Status Comment   Specimen Description BLOOD RIGHT WRIST   Final    Special Requests BOTTLES DRAWN AEROBIC AND ANAEROBIC 10CC   Final    Culture  Setup Time [id number]   Final    Culture     Final    Value:        BLOOD CULTURE RECEIVED NO GROWTH TO DATE CULTURE WILL BE HELD FOR 5 DAYS BEFORE ISSUING A FINAL NEGATIVE REPORT   Report Status PENDING   Incomplete   CULTURE, RESPIRATORY     Status: Normal   Collection Time   05/24/11  3:01 PM      Component Value Range Status Comment   Specimen Description TRACHEAL ASPIRATE   Final    Special Requests NONE   Final    Gram Stain     Final    Value: FEW WBC PRESENT,BOTH PMN AND MONONUCLEAR     NO SQUAMOUS EPITHELIAL CELLS SEEN     NO ORGANISMS SEEN   Culture RARE CANDIDA ALBICANS   Final    Report Status 05/26/2011 FINAL   Final   CULTURE, BLOOD (ROUTINE X 2)     Status: Normal (Preliminary result)   Collection Time   05/26/11  5:50 PM      Component Value Range Status Comment   Specimen Description BLOOD LEFT HAND   Final    Special Requests BOTTLES DRAWN AEROBIC ONLY 3CC   Final    Culture  Setup Time [id number]   Final    Culture     Final    Value:        BLOOD CULTURE RECEIVED NO GROWTH TO DATE CULTURE WILL BE  HELD FOR 5 DAYS BEFORE ISSUING A FINAL NEGATIVE REPORT   Report Status PENDING   Incomplete   CULTURE, BLOOD (ROUTINE X 2)     Status: Normal (Preliminary result)   Collection Time   05/26/11  6:00 PM      Component Value Range Status Comment   Specimen Description BLOOD LEFT WRIST   Final      Special Requests     Final    Value: BOTTLES DRAWN AEROBIC AND ANAEROBIC 7CC AER 4CC ANA   Culture  Setup Time [id number]   Final    Culture     Final    Value:        BLOOD CULTURE RECEIVED NO GROWTH TO DATE CULTURE WILL BE HELD FOR 5 DAYS BEFORE ISSUING A FINAL NEGATIVE REPORT   Report Status PENDING   Incomplete     Assessment: 71 yo male admitted for elective back surgery, now s/p laminectomy and decompression of L5 S1 on 3/26, then with worsening confusion on empiric vancomycin for  pneumonia.   Goal of Therapy:  Vancomycin trough level 15-20 mcg/ml  Plan:  Change vancomycin 750 mg IV q12h  Eddie Candle 05/28/2011,10:55 PM

## 2011-05-28 NOTE — Progress Notes (Signed)
Patient ID: John [name], male   DOB: August 31, 1940, 71 y.o.   MRN: [id number]     SUBJECTIVE: Intubated and sedated.  Vitals stable.  CVP running 7 this am.  Minimal progress.  Requiring 15 cm PEEP.  Concerned for ARDS.  CT L-spine with no definite abscess.  CT chest with small to moderate effusions and bibasilar atelectasis versus infiltrates.  Urine output is reasonable, BUN up.     Marland Kitchen antiseptic oral rinse  1 application Mouth Rinse QID  . aspirin  81 mg Per Tube Daily  . calcium carbonate  2 tablet Oral Daily  . carbidopa-levodopa  1 tablet Oral TID  . chlorhexidine  15 mL Mouth/Throat BID  . docusate  100 mg Per Tube BID  . feeding supplement  30 mL Per Tube TID  . free water  200 mL Per Tube Q6H  . hydrocortisone sod succinate (SOLU-CORTEF) injection  20 mg Intravenous Daily  . insulin aspart  0-20 Units Subcutaneous Q4H  . insulin glargine  12 Units Subcutaneous Once  . insulin glargine  24 Units Subcutaneous Q24H  . ipratropium  0.5 mg Nebulization QID  . lamoTRIgine  200 mg Oral Daily  . levalbuterol  0.63 mg Nebulization QID  . linagliptin  5 mg Oral Daily  . mycophenolate (CELLCEPT) IVPB  500 mg Intravenous Q12H  . pantoprazole sodium  40 mg Per Tube Q1200  . piperacillin-tazobactam (ZOSYN)  IV  3.375 g Intravenous Q8H  . pyridostigmine  30 mg Oral TID  . senna  1 tablet Oral BID  . tacrolimus  5 mg Oral BID  . Tamsulosin HCl  0.4 mg Oral Daily  . vancomycin  1,250 mg Intravenous Q24H  . DISCONTD: insulin glargine  12 Units Subcutaneous Daily  . DISCONTD: insulin glargine  24 Units Subcutaneous Daily  Versed gtt Fentanyl gtt    Filed Vitals:   05/28/11 0700 05/28/11 0755 05/28/11 0759 05/28/11 0800  BP: 124/45 124/45    Pulse: 85 90    Temp:    98.3 F (36.8 C)  TempSrc:    Oral  Resp: 15     Height:      Weight:      SpO2: 100% 98% 100%     Intake/Output Summary (Last 24 hours) at 05/28/11 0811 Last data filed at 05/28/11 0639  Gross per 24 hour    Intake 3359.33 ml  Output   1975 ml  Net 1384.33 ml    LABS: Basic Metabolic Panel:  Basename 05/28/11 0530 05/27/11 0515  NA 150* 147*  K 3.7 3.6  CL 109 106  CO2 34* 34*  GLUCOSE 341* 418*  BUN 60* 59*  CREATININE 1.45* 1.48*  CALCIUM 9.8 9.7  MG 2.6* 2.3  PHOS 2.4 2.5   Liver Function Tests: No results found for this basename: AST:2,ALT:2,ALKPHOS:2,BILITOT:2,PROT:2,ALBUMIN:2 in the last 72 hours No results found for this basename: LIPASE:2,AMYLASE:2 in the last 72 hours CBC:  Basename 05/28/11 0530 05/27/11 0515  WBC 11.6* 13.2*  NEUTROABS -- --  HGB 8.7* 8.8*  HCT 29.1* 29.5*  MCV 72.2* 70.9*  PLT 265 226   Cardiac Enzymes: No results found for this basename: CKTOTAL:3,CKMB:3,CKMBINDEX:3,TROPONINI:3 in the last 72 hours BNP: No components found with this basename: POCBNP:3 D-Dimer: No results found for this basename: DDIMER:2 in the last 72 hours Hemoglobin A1C: No results found for this basename: HGBA1C in the last 72 hours Fasting Lipid Panel: No results found for this basename: CHOL,HDL,LDLCALC,TRIG,CHOLHDL,LDLDIRECT in the last 72 hours  Thyroid Function Tests: No results found for this basename: TSH,T4TOTAL,FREET3,T3FREE,THYROIDAB in the last 72 hours Anemia Panel: No results found for this basename: VITAMINB12,FOLATE,FERRITIN,TIBC,IRON,RETICCTPCT in the last 72 hours   PHYSICAL EXAM  General: Intubated/sedated Neck: JVP difficult with vent, no thyromegaly or thyroid nodule.  Lungs: Dependent crackles.  CV: Nondisplaced PMI. Heart regular S1/S2, no S3/S4, 2/6 SEM RUSB. No edema. No carotid bruit.  Abdomen: Soft, nontender, no hepatosplenomegaly, no distention.  Extremities: No clubbing or cyanosis.   ASSESSMENT AND PLAN:  71 yo with history of renal transplant, DM, HTN, COPD on home s, diastolic CHF, and CAD s/p CABG is now s/p back surgery. He had post-op delirium and acute respiratory failure with intubation. 1. CHF: CVP 7 now.  CXR with  pulmonary edema pattern. Suspicion for ARDS.  Would hold Lasix for now as CVP not appreciably elevated and BUN rising.  2. Pulmonary: Concern for ARDS with pulmonary edema pattern in absence of evidence for significant volume overload.  Bibasilar infiltrates could be suggestive of PNA.  Management per CCM => daily sedation and vent weans.  3. Renal: BUN up, creatinine down.  He is off Lasix.  4. ID: Febrile last night.  Blood cultures unrevealing. Possible HCAP.  CT of surgical site showed no definite abscess. 5. Hypernatremia: Free water boluses   John [name] 05/28/2011 8:11 AM

## 2011-05-28 NOTE — Progress Notes (Signed)
No new issues.  Minimal progress with vent weaning.awakens to voice.  Moves LE's well. Denies pain.  No new rec's. Cont current care.

## 2011-05-29 ENCOUNTER — Inpatient Hospital Stay (HOSPITAL_COMMUNITY): Payer: Medicare Other

## 2011-05-29 DIAGNOSIS — R509 Fever, unspecified: Secondary | ICD-10-CM

## 2011-05-29 LAB — BASIC METABOLIC PANEL
CO2: 34 mEq/L — ABNORMAL HIGH (ref 19–32)
Calcium: 9.8 mg/dL (ref 8.4–10.5)
Creatinine, Ser: 1.26 mg/dL (ref 0.50–1.35)
GFR calc Af Amer: 65 mL/min — ABNORMAL LOW (ref >90)

## 2011-05-29 LAB — CBC
MCH: 21 pg — ABNORMAL LOW (ref 26.0–34.0)
MCV: 72.3 fL — ABNORMAL LOW (ref 78.0–100.0)
Platelets: 238 10*3/uL (ref 150–400)
RDW: 19.3 % — ABNORMAL HIGH (ref 11.5–15.5)

## 2011-05-29 LAB — GLUCOSE, CAPILLARY
Glucose-Capillary: 317 mg/dL — ABNORMAL HIGH (ref 70–99)
Glucose-Capillary: 265 mg/dL — ABNORMAL HIGH (ref 70–99)
Glucose-Capillary: 267 mg/dL — ABNORMAL HIGH (ref 70–99)

## 2011-05-29 LAB — MAGNESIUM: Magnesium: 2.5 mg/dL (ref 1.5–2.5)

## 2011-05-29 MED ORDER — INSULIN ASPART 100 UNIT/ML ~~LOC~~ SOLN: 0.0000 [IU] | SUBCUTANEOUS | Status: DC

## 2011-05-29 MED ORDER — SODIUM CHLORIDE 0.45 % IV SOLN
INTRAVENOUS | Status: DC
  Administered 2011-05-31: 05:00:00 via INTRAVENOUS
  Administered 2011-06-03: 10 mL/h via INTRAVENOUS

## 2011-05-29 MED ORDER — SODIUM CHLORIDE 0.9 % IV SOLN
INTRAVENOUS | Status: DC
  Administered 2011-05-29: 11:00:00 via INTRAVENOUS
  Administered 2011-05-29: 10.7 [IU]/h via INTRAVENOUS
  Filled 2011-05-29 (×3): qty 1

## 2011-05-29 MED ORDER — DEXTROSE 10 % IV SOLN: INTRAVENOUS | Status: DC

## 2011-05-29 MED ORDER — CARBIDOPA-LEVODOPA 25-100 MG PO TABS
1.0000 | ORAL_TABLET | Freq: Three times a day (TID) | ORAL | Status: DC
  Administered 2011-05-29 – 2011-06-08 (×27): 1
  Filled 2011-05-29 (×33): qty 1

## 2011-05-29 MED ORDER — FREE WATER
250.0000 mL | Status: DC
  Administered 2011-05-29 – 2011-06-02 (×24): 250 mL

## 2011-05-29 MED ORDER — NEPRO/CARBSTEADY PO LIQD
1000.0000 mL | ORAL | Status: DC
  Administered 2011-05-29 – 2011-05-30 (×2): 1000 mL
  Filled 2011-05-29 (×3): qty 1000

## 2011-05-29 MED ORDER — CALCIUM CARBONATE 1250 (500 CA) MG PO TABS
2.0000 | ORAL_TABLET | Freq: Every day | ORAL | Status: DC
  Administered 2011-05-29 – 2011-06-08 (×10): 1000 mg
  Filled 2011-05-29 (×11): qty 2

## 2011-05-29 MED ORDER — SODIUM CHLORIDE 0.45 % IV SOLN
INTRAVENOUS | Status: DC
  Administered 2011-05-29: 02:00:00 via INTRAVENOUS

## 2011-05-29 NOTE — Progress Notes (Signed)
Long talk with his wife about outcome and further procedures. Clinically he does move all four extremities but not f/c.  CCM to talk to John [name] about trach. I will be following him while dr Danielle Dess is out of town b

## 2011-05-29 NOTE — Progress Notes (Signed)
ANTIBIOTIC CONSULT NOTE - FOLLOW UP  Pharmacy Consult for Vanco/Zosyn/Micafungin Indication: pneumonia  No Known Allergies  Patient Measurements: Height: 6\' 2"  (188 cm) Weight: 216 lb 11.4 oz (98.3 kg) IBW/kg (Calculated) : 82.2  Adjusted Body Weight:   Vital Signs: Temp: 99.9 F (37.7 C) (04/08 0800) Temp src: Axillary (04/08 0800) BP: 124/43 mmHg (04/08 0800) Pulse Rate: 81  (04/08 0800) Intake/Output from previous day: 04/07 0701 - 04/08 0700 In: 3660.4 [I.V.:695.4; JY/NW:2956; IV Piggyback:700] Out: 1735 [Urine:1735] Intake/Output from this shift: Total I/O In: 105 [I.V.:20; NG/GT:85] Out: 200 [Urine:200]  Labs:  [facility] 05/29/11 0430 05/28/11 0530 05/27/11 0515  WBC 13.0* 11.6* 13.2*  HGB 8.8* 8.7* 8.8*  PLT 238 265 226  LABCREA -- -- --  CREATININE 1.26 1.45* 1.48*   Estimated Creatinine Clearance: 62.5 ml/min (by C-G formula based on Cr of 1.26).  Basename 05/28/11 2210  VANCOTROUGH 13.5  VANCOPEAK --  VANCORANDOM --  GENTTROUGH --  GENTPEAK --  GENTRANDOM --  TOBRATROUGH --  TOBRAPEAK --  TOBRARND --  AMIKACINPEAK --  AMIKACINTROU --  AMIKACIN --     Microbiology: Recent Results (from the past 720 hour(s))  SURGICAL PCR SCREEN     Status: Normal   Collection Time   05/10/11  1:34 PM      Component Value Range Status Comment   MRSA, PCR NEGATIVE  NEGATIVE  Final    Staphylococcus aureus NEGATIVE  NEGATIVE  Final   CULTURE, BLOOD (ROUTINE X 2)     Status: Normal   Collection Time   05/18/11  6:45 PM      Component Value Range Status Comment   Specimen Description BLOOD LEFT ARM   Final    Special Requests BOTTLES DRAWN AEROBIC ONLY 10CC   Final    Culture  Setup Time [id number]   Final    Culture NO GROWTH 5 DAYS   Final    Report Status 05/25/2011 FINAL   Final   CULTURE, BLOOD (ROUTINE X 2)     Status: Normal   Collection Time   05/18/11  6:50 PM      Component Value Range Status Comment   Specimen Description BLOOD RIGHT HAND   Final     Special Requests BOTTLES DRAWN AEROBIC ONLY 10CC   Final    Culture  Setup Time [id number]   Final    Culture NO GROWTH 5 DAYS   Final    Report Status 05/25/2011 FINAL   Final   URINE CULTURE     Status: Normal   Collection Time   05/19/11 10:33 AM      Component Value Range Status Comment   Specimen Description URINE, CATHETERIZED   Final    Special Requests CONDOM   Final    Culture  Setup Time [id number]   Final    Colony Count 6,000 COLONIES/ML   Final    Culture [facility] MORGANII   Final    Report Status 05/22/2011 FINAL   Final    Organism ID, Bacteria MORGANELLA MORGANII   Final   CULTURE, SPUTUM-ASSESSMENT     Status: Normal   Collection Time   05/19/11 11:16 AM      Component Value Range Status Comment   Specimen Description SPUTUM   Final    Special Requests NONE   Final    Sputum evaluation     Final    Value: MICROSCOPIC FINDINGS SUGGEST THAT THIS SPECIMEN IS NOT REPRESENTATIVE OF LOWER RESPIRATORY SECRETIONS. PLEASE RECOLLECT.  Gram Stain Report Called to,Read Back By and Verified With: Wu RN 11:55 05/19/11 ([email or web address])   Report Status 05/19/2011 FINAL   Final   CULTURE, SPUTUM-ASSESSMENT     Status: Normal   Collection Time   05/19/11  5:52 PM      Component Value Range Status Comment   Specimen Description SPUTUM   Final    Special Requests Immunocompromised   Final    Sputum evaluation     Final    Value: MICROSCOPIC FINDINGS SUGGEST THAT THIS SPECIMEN IS NOT REPRESENTATIVE OF LOWER RESPIRATORY SECRETIONS. PLEASE RECOLLECT.     CALLED TO RN D.JOHNSON AT 2016 BY L.PITT 0/29/13   Report Status 05/19/2011 FINAL   Final   MRSA PCR SCREENING     Status: Normal   Collection Time   05/21/11  9:50 AM      Component Value Range Status Comment   MRSA by PCR NEGATIVE  NEGATIVE  Final   CULTURE, BLOOD (ROUTINE X 2)     Status: Normal (Preliminary result)   Collection Time   05/24/11 12:40 PM      Component Value Range Status Comment   Specimen Description BLOOD  LEFT ARM   Final    Special Requests BOTTLES DRAWN AEROBIC AND ANAEROBIC 10CC   Final    Culture  Setup Time [id number]   Final    Culture     Final    Value:        BLOOD CULTURE RECEIVED NO GROWTH TO DATE CULTURE WILL BE HELD FOR 5 DAYS BEFORE ISSUING A FINAL NEGATIVE REPORT   Report Status PENDING   Incomplete   CULTURE, BLOOD (ROUTINE X 2)     Status: Normal (Preliminary result)   Collection Time   05/24/11 12:40 PM      Component Value Range Status Comment   Specimen Description BLOOD RIGHT WRIST   Final    Special Requests BOTTLES DRAWN AEROBIC AND ANAEROBIC 10CC   Final    Culture  Setup Time [id number]   Final    Culture     Final    Value:        BLOOD CULTURE RECEIVED NO GROWTH TO DATE CULTURE WILL BE HELD FOR 5 DAYS BEFORE ISSUING A FINAL NEGATIVE REPORT   Report Status PENDING   Incomplete   CULTURE, RESPIRATORY     Status: Normal   Collection Time   05/24/11  3:01 PM      Component Value Range Status Comment   Specimen Description TRACHEAL ASPIRATE   Final    Special Requests NONE   Final    Gram Stain     Final    Value: FEW WBC PRESENT,BOTH PMN AND MONONUCLEAR     NO SQUAMOUS EPITHELIAL CELLS SEEN     NO ORGANISMS SEEN   Culture RARE CANDIDA ALBICANS   Final    Report Status 05/26/2011 FINAL   Final   CULTURE, BLOOD (ROUTINE X 2)     Status: Normal (Preliminary result)   Collection Time   05/26/11  5:50 PM      Component Value Range Status Comment   Specimen Description BLOOD LEFT HAND   Final    Special Requests BOTTLES DRAWN AEROBIC ONLY 3CC   Final    Culture  Setup Time [id number]   Final    Culture     Final    Value:        BLOOD CULTURE RECEIVED NO GROWTH TO DATE CULTURE WILL BE  HELD FOR 5 DAYS BEFORE ISSUING A FINAL NEGATIVE REPORT   Report Status PENDING   Incomplete   CULTURE, BLOOD (ROUTINE X 2)     Status: Normal (Preliminary result)   Collection Time   05/26/11  6:00 PM      Component Value Range Status Comment   Specimen Description BLOOD LEFT  WRIST   Final    Special Requests     Final    Value: BOTTLES DRAWN AEROBIC AND ANAEROBIC 7CC AER 4CC ANA   Culture  Setup Time [id number]   Final    Culture     Final    Value:        BLOOD CULTURE RECEIVED NO GROWTH TO DATE CULTURE WILL BE HELD FOR 5 DAYS BEFORE ISSUING A FINAL NEGATIVE REPORT   Report Status PENDING   Incomplete     Anti-infectives     Start     Dose/Rate Route Frequency Ordered Stop   05/29/11 1800   vancomycin (VANCOCIN) 750 mg in sodium chloride 0.9 % 150 mL IVPB        750 mg 150 mL/hr over 60 Minutes Intravenous Every 12 hours 05/28/11 2304     05/28/11 1015   micafungin (MYCAMINE) 100 mg in sodium chloride 0.9 % 100 mL IVPB     Comments: Please ask pharmacy to assist with dosing.      100 mg 100 mL/hr over 1 Hours Intravenous Daily 05/28/11 1014     05/24/11 2200   vancomycin (VANCOCIN) 1,250 mg in sodium chloride 0.9 % 250 mL IVPB  Status:  Discontinued        1,250 mg 166.7 mL/hr over 90 Minutes Intravenous Every 24 hours 05/24/11 1922 05/28/11 2304   05/24/11 1800   vancomycin (VANCOCIN) 1,250 mg in sodium chloride 0.9 % 250 mL IVPB  Status:  Discontinued        1,250 mg 166.7 mL/hr over 90 Minutes Intravenous Every 24 hours 05/24/11 0141 05/24/11 0940   05/21/11 1000  piperacillin-tazobactam (ZOSYN) IVPB 3.375 g       3.375 g 12.5 mL/hr over 240 Minutes Intravenous Every 8 hours 05/21/11 0855     05/21/11 1000   vancomycin (VANCOCIN) IVPB 1000 mg/200 mL premix  Status:  Discontinued        1,000 mg 200 mL/hr over 60 Minutes Intravenous Every 12 hours 05/21/11 0855 05/23/11 1101   05/16/11 0932   bacitracin 50,000 Units in sodium chloride irrigation 0.9 % 500 mL irrigation  Status:  Discontinued          As needed 05/16/11 0932 05/16/11 1055   05/16/11 0700   bacitracin 81191 UNITS injection     Comments: Imagene Gurney: cabinet override         05/16/11 0700 05/16/11 1914   05/16/11 0000   ceFAZolin (ANCEF) IVPB 2 g/50 mL premix        2  g 100 mL/hr over 30 Minutes Intravenous 60 min pre-op 05/15/11 1449 05/16/11 0828          Assessment: 71 yo M s/p spinal surgery 3/26 admitted to ICU with post op delerium/lethargy/SOB  ID: PNA/fever in immunocomp, ?discitis?Marland Kitchen On-going fevers with Tmax 102.4 and increased WBC 13. Vanco trough 4/7 = 13.5. Dose adjusted. CXR shows diffuse edema without clear focal infiltrate.  3/31 Vanc>> 3/31Zosyn>> 4/7 Micafungin>>  Cultures: Urine 3/29>>> MORGANELLA MORGANII (6,000 COLONIES/ML) sens to zosyn BCx2 3/28>>> negative ; BCx 4/3 ngtd; BCx 4/5 pending Sputum 3/31>>> NTD ; resp  cx 4/3 rare c. albicans Pct 3/31>>> NTD  Cards: Hx HTN, HLD, CHF diastolic, CAD s/p CABG, afib: on ASA 81mg   Endo: hx DM (insulin pump at home): CBGs 251-414 Meds: linagliptan, SSI, now on insulin infusion  Neuro: s/p laminectomy and decompression of L5 S1 on 3/26 - post op delirium/respiratory distress on vent . Meds:  lamotrigine, sinemet, pyridostigmine,  FEN: Na up to 153 on free water. Tube PPI. Nepro.  Renal: Hx renal transplant 2002. Scr down to 1.26 today. Meds: tacrolimus, cellcept, prednisone at home - subtherapeutic tacro at 3.5 on 4/2. Currently kept on 5mg  BID, cellcept 500mg  BID, and hydrocort 20 IV,  Pulm: Hx COPD on home O2. Now VDRF on Fent/Versed. Discuss trach with family.  Heme/Onc: Hx prostate ca  Best practices: SCDs, PPI   Goal of Therapy:  Vancomycin trough level 15-20 mcg/ml  Plan:  Vanco increased to 750mg  IV q12. Zosyn 3.375g IV q8h. Micafungin 100mg  IV /24h.  Chayden Garrelts S. Merilynn Finland, PharmD, [facility] Clinical Staff Pharmacist Pager [phone]  05/29/2011,9:39 AM

## 2011-05-29 NOTE — Progress Notes (Addendum)
Patient ID: John [name], male   DOB: 03/26/1940, 71 y.o.   MRN: [id number]     SUBJECTIVE: Intubated and sedated.  Vitals stable.  CVP running 10 this am.  No significant change clinically. Creatinine improved.   Marland Kitchen antiseptic oral rinse  1 application Mouth Rinse QID  . aspirin  81 mg Per Tube Daily  . calcium carbonate  2 tablet Per Tube Daily  . carbidopa-levodopa  1 tablet Per Tube TID  . chlorhexidine  15 mL Mouth/Throat BID  . docusate  100 mg Per Tube BID  . free water  250 mL Per Tube Q4H  . hydrocortisone sod succinate (SOLU-CORTEF) injection  20 mg Intravenous Daily  . ipratropium  0.5 mg Nebulization QID  . lamoTRIgine  200 mg Oral Daily  . levalbuterol  0.63 mg Nebulization QID  . linagliptin  5 mg Oral Daily  . micafungin [facility]) IV  100 mg Intravenous Daily  . mycophenolate (CELLCEPT) IVPB  500 mg Intravenous Q12H  . pantoprazole sodium  40 mg Per Tube Q1200  . piperacillin-tazobactam (ZOSYN)  IV  3.375 g Intravenous Q8H  . pyridostigmine  30 mg Oral TID  . senna  1 tablet Oral BID  . tacrolimus  5 mg Oral BID  . Tamsulosin HCl  0.4 mg Oral Daily  . vancomycin  750 mg Intravenous Q12H  . DISCONTD: calcium carbonate  2 tablet Oral Daily  . DISCONTD: carbidopa-levodopa  1 tablet Oral TID  . DISCONTD: feeding supplement  30 mL Per Tube TID  . DISCONTD: free water  200 mL Per Tube 6 X Daily  . DISCONTD: insulin aspart  0-10 Units Subcutaneous Q4H  . DISCONTD: insulin aspart  0-20 Units Subcutaneous Q4H  . DISCONTD: insulin glargine  12 Units Subcutaneous BID  . DISCONTD: insulin glargine  12 Units Subcutaneous BID  . DISCONTD: insulin glargine  24 Units Subcutaneous BID  . DISCONTD: vancomycin  1,250 mg Intravenous Q24H  Versed gtt Fentanyl gtt    Filed Vitals:   05/29/11 1207 05/29/11 1208 05/29/11 1300 05/29/11 1400  BP:  140/55 124/79 139/56  Pulse:  82 78 78  Temp:  98.8 F (37.1 C)    TempSrc:  Axillary    Resp:   21 20  Height:      Weight:       SpO2: 100% 100% 100% 100%    Intake/Output Summary (Last 24 hours) at 05/29/11 1453 Last data filed at 05/29/11 1400  Gross per 24 hour  Intake 3872.35 ml  Output   1800 ml  Net 2072.35 ml    LABS: Basic Metabolic Panel:  Basename 05/29/11 0430 05/28/11 0530  NA 153* 150*  K 3.8 3.7  CL 113* 109  CO2 34* 34*  GLUCOSE 312* 341*  BUN 52* 60*  CREATININE 1.26 1.45*  CALCIUM 9.8 9.8  MG 2.5 2.6*  PHOS 2.6 2.4   Liver Function Tests: No results found for this basename: AST:2,ALT:2,ALKPHOS:2,BILITOT:2,PROT:2,ALBUMIN:2 in the last 72 hours No results found for this basename: LIPASE:2,AMYLASE:2 in the last 72 hours CBC:  Basename 05/29/11 0430 05/28/11 0530  WBC 13.0* 11.6*  NEUTROABS -- --  HGB 8.8* 8.7*  HCT 30.3* 29.1*  MCV 72.3* 72.2*  PLT 238 265   Cardiac Enzymes: No results found for this basename: CKTOTAL:3,CKMB:3,CKMBINDEX:3,TROPONINI:3 in the last 72 hours BNP: No components found with this basename: POCBNP:3 D-Dimer: No results found for this basename: DDIMER:2 in the last 72 hours Hemoglobin A1C: No results found for this  basename: HGBA1C in the last 72 hours Fasting Lipid Panel: No results found for this basename: CHOL,HDL,LDLCALC,TRIG,CHOLHDL,LDLDIRECT in the last 72 hours Thyroid Function Tests: No results found for this basename: TSH,T4TOTAL,FREET3,T3FREE,THYROIDAB in the last 72 hours Anemia Panel: No results found for this basename: VITAMINB12,FOLATE,FERRITIN,TIBC,IRON,RETICCTPCT in the last 72 hours   PHYSICAL EXAM  General: Intubated/sedated Neck: JVP difficult with vent, no thyromegaly or thyroid nodule.  Lungs: Dependent crackles.  CV: Nondisplaced PMI. Heart regular S1/S2, no S3/S4, 2/6 SEM RUSB. No edema. No carotid bruit.  Abdomen: Soft, nontender, no hepatosplenomegaly, no distention.  Extremities: No clubbing or cyanosis.   ASSESSMENT AND PLAN:  71 yo with history of renal transplant, DM, HTN, COPD on home s, diastolic CHF, and CAD  s/p CABG is now s/p back surgery. He had post-op delirium and acute respiratory failure with intubation. 1. CHF: CVP 10 now.  CXR with improvement in edema pattern but moderate right pleural effusion present.  Creatinine is improved.  If CVP rises further, would restart lasix IV.    2. Pulmonary: Baseline COPD with superimposed diastolic CHF and possible HCAP versus aspiration-related PNA.  Moderate right pleural effusion.  He actually did not seem particularly volume overloaded when he had to be intubated (had been diuresed and CVPs initially as low as 2).  Now CVPs trending up as lasix was held.  Will need to restart soon.   3. Renal: BUN/creatinine improved.  4. ID: Still no definitive infectious source.  ? HCAP.  He was started on antifungal.  5. Hypernatremia: Free water boluses ongoing.  6. Long-term, it is looking like he would be a candidate for tracheostomy and LTAC.  Will need discussion with his wife.    John [name] 05/29/2011 2:53 PM

## 2011-05-29 NOTE — Progress Notes (Signed)
Utilization review completed. Dail Meece, RN, BSN. 05/29/11  

## 2011-05-29 NOTE — Progress Notes (Signed)
Subjective: obtunded   Antibiotics:  Anti-infectives     Start     Dose/Rate Route Frequency Ordered Stop   05/29/11 1800   vancomycin (VANCOCIN) 750 mg in sodium chloride 0.9 % 150 mL IVPB        750 mg 150 mL/hr over 60 Minutes Intravenous Every 12 hours 05/28/11 2304     05/28/11 1015   micafungin (MYCAMINE) 100 mg in sodium chloride 0.9 % 100 mL IVPB     Comments: Please ask pharmacy to assist with dosing.      100 mg 100 mL/hr over 1 Hours Intravenous Daily 05/28/11 1014     05/24/11 2200   vancomycin (VANCOCIN) 1,250 mg in sodium chloride 0.9 % 250 mL IVPB  Status:  Discontinued        1,250 mg 166.7 mL/hr over 90 Minutes Intravenous Every 24 hours 05/24/11 1922 05/28/11 2304   05/24/11 1800   vancomycin (VANCOCIN) 1,250 mg in sodium chloride 0.9 % 250 mL IVPB  Status:  Discontinued        1,250 mg 166.7 mL/hr over 90 Minutes Intravenous Every 24 hours 05/24/11 0141 05/24/11 0940   05/21/11 1000  piperacillin-tazobactam (ZOSYN) IVPB 3.375 g       3.375 g 12.5 mL/hr over 240 Minutes Intravenous Every 8 hours 05/21/11 0855     05/21/11 1000   vancomycin (VANCOCIN) IVPB 1000 mg/200 mL premix  Status:  Discontinued        1,000 mg 200 mL/hr over 60 Minutes Intravenous Every 12 hours 05/21/11 0855 05/23/11 1101   05/16/11 0932   bacitracin 50,000 Units in sodium chloride irrigation 0.9 % 500 mL irrigation  Status:  Discontinued          As needed 05/16/11 0932 05/16/11 1055   05/16/11 0700   bacitracin 16109 UNITS injection     Comments: Imagene Gurney: cabinet override         05/16/11 0700 05/16/11 1914   05/16/11 0000   ceFAZolin (ANCEF) IVPB 2 g/50 mL premix        2 g 100 mL/hr over 30 Minutes Intravenous 60 min pre-op 05/15/11 1449 05/16/11 0828          Medications: Scheduled Meds:   . antiseptic oral rinse  1 application Mouth Rinse QID  . aspirin  81 mg Per Tube Daily  . calcium carbonate  2 tablet Per Tube Daily  . carbidopa-levodopa  1 tablet Per Tube  TID  . chlorhexidine  15 mL Mouth/Throat BID  . docusate  100 mg Per Tube BID  . free water  250 mL Per Tube Q4H  . hydrocortisone sod succinate (SOLU-CORTEF) injection  20 mg Intravenous Daily  . insulin aspart  0-10 Units Subcutaneous Q4H  . ipratropium  0.5 mg Nebulization QID  . lamoTRIgine  200 mg Oral Daily  . levalbuterol  0.63 mg Nebulization QID  . linagliptin  5 mg Oral Daily  . micafungin [facility]) IV  100 mg Intravenous Daily  . mycophenolate (CELLCEPT) IVPB  500 mg Intravenous Q12H  . pantoprazole sodium  40 mg Per Tube Q1200  . piperacillin-tazobactam (ZOSYN)  IV  3.375 g Intravenous Q8H  . pyridostigmine  30 mg Oral TID  . senna  1 tablet Oral BID  . tacrolimus  5 mg Oral BID  . Tamsulosin HCl  0.4 mg Oral Daily  . vancomycin  750 mg Intravenous Q12H  . DISCONTD: calcium carbonate  2 tablet Oral Daily  . DISCONTD: carbidopa-levodopa  1 tablet Oral TID  . DISCONTD: feeding supplement  30 mL Per Tube TID  . DISCONTD: free water  200 mL Per Tube Q6H  . DISCONTD: free water  200 mL Per Tube 6 X Daily  . DISCONTD: insulin aspart  0-20 Units Subcutaneous Q4H  . DISCONTD: insulin glargine  12 Units Subcutaneous BID  . DISCONTD: insulin glargine  12 Units Subcutaneous BID  . DISCONTD: insulin glargine  24 Units Subcutaneous Q24H  . DISCONTD: insulin glargine  24 Units Subcutaneous BID  . DISCONTD: vancomycin  1,250 mg Intravenous Q24H   Continuous Infusions:   . sodium chloride 10 mL/hr at 05/28/11 1326  . sodium chloride 10 mL/hr at 05/29/11 0141  . sodium chloride 10 mL/hr at 05/29/11 0148  . dextrose    . feeding supplement (NEPRO CARB STEADY)    . fentaNYL infusion INTRAVENOUS 50 mcg/hr (05/29/11 0840)  . insulin (NOVOLIN-R) infusion    . midazolam (VERSED) infusion 2 mg/hr (05/29/11 0840)  . DISCONTD: sodium chloride 1,000 mL (05/27/11 0011)  . DISCONTD: feeding supplement (OSMOLITE 1.2 CAL) 1,000 mL (05/28/11 2301)   PRN Meds:.acetaminophen (TYLENOL) oral  liquid 160 mg/5 mL, acetaminophen, acetaminophen, hydrALAZINE, hydrocortisone cream, magnesium hydroxide, menthol-cetylpyridinium, ondansetron (ZOFRAN) IV, phenol   Objective: Weight change: 2 lb 6.8 oz (1.1 kg)  Intake/Output Summary (Last 24 hours) at 05/29/11 0938 Last data filed at 05/29/11 0800  Gross per 24 hour  Intake 3581.42 ml  Output   1735 ml  Net 1846.42 ml   Blood pressure 124/43, pulse 81, temperature 99.9 F (37.7 C), temperature source Axillary, resp. rate 15, height 6\' 2"  (1.88 m), weight 216 lb 11.4 oz (98.3 kg), SpO2 100.00%. Temp:  [99.7 F (37.6 C)-102.4 F (39.1 C)] 99.9 F (37.7 C) (04/08 0800) Pulse Rate:  [74-90] 81  (04/08 0800) Resp:  [13-23] 15  (04/08 0800) BP: (117-147)/(31-81) 124/43 mmHg (04/08 0800) SpO2:  [100 %] 100 % (04/08 0804) FiO2 (%):  [40 %] 40 % (04/08 0804) Weight:  [216 lb 11.4 oz (98.3 kg)] 216 lb 11.4 oz (98.3 kg) (04/08 0500)  Physical Exam: General:  awake, trying to get at things with hands which are in mits HEENT: anicteric sclera, pupils reactive to light and accommodation, EOMI CVS regular rate, normal r,  no murmur rubs or gallops Chest: fairly  clear to auscultation bilaterally,Abdomen: soft nontender, nondistended, normal bowel sounds, Extremities: no  clubbing or edema noted bilaterally Skin: no rashes, surgical site is clean Neuro: delirious but not focal deficitis  Lab Results:  Basename 05/29/11 0430 05/28/11 0530  WBC 13.0* 11.6*  HGB 8.8* 8.7*  HCT 30.3* 29.1*  PLT 238 265    BMET  Basename 05/29/11 0430 05/28/11 0530  NA 153* 150*  K 3.8 3.7  CL 113* 109  CO2 34* 34*  GLUCOSE 312* 341*  BUN 52* 60*  CREATININE 1.26 1.45*  CALCIUM 9.8 9.8    Micro Results: Recent Results (from the past 240 hour(s))  URINE CULTURE     Status: Normal   Collection Time   05/19/11 10:33 AM      Component Value Range Status Comment   Specimen Description URINE, CATHETERIZED   Final    Special Requests CONDOM    Final    Culture  Setup Time [id number]   Final    Colony Count 6,000 COLONIES/ML   Final    Culture [facility] MORGANII   Final    Report Status 05/22/2011 FINAL   Final  Organism ID, Bacteria MORGANELLA MORGANII   Final   CULTURE, SPUTUM-ASSESSMENT     Status: Normal   Collection Time   05/19/11 11:16 AM      Component Value Range Status Comment   Specimen Description SPUTUM   Final    Special Requests NONE   Final    Sputum evaluation     Final    Value: MICROSCOPIC FINDINGS SUGGEST THAT THIS SPECIMEN IS NOT REPRESENTATIVE OF LOWER RESPIRATORY SECRETIONS. PLEASE RECOLLECT.     Gram Stain Report Called to,Read Back By and Verified With: Wu RN 11:55 05/19/11 ([email or web address])   Report Status 05/19/2011 FINAL   Final   CULTURE, SPUTUM-ASSESSMENT     Status: Normal   Collection Time   05/19/11  5:52 PM      Component Value Range Status Comment   Specimen Description SPUTUM   Final    Special Requests Immunocompromised   Final    Sputum evaluation     Final    Value: MICROSCOPIC FINDINGS SUGGEST THAT THIS SPECIMEN IS NOT REPRESENTATIVE OF LOWER RESPIRATORY SECRETIONS. PLEASE RECOLLECT.     CALLED TO RN D.JOHNSON AT 2016 BY L.PITT 0/29/13   Report Status 05/19/2011 FINAL   Final   MRSA PCR SCREENING     Status: Normal   Collection Time   05/21/11  9:50 AM      Component Value Range Status Comment   MRSA by PCR NEGATIVE  NEGATIVE  Final   CULTURE, BLOOD (ROUTINE X 2)     Status: Normal (Preliminary result)   Collection Time   05/24/11 12:40 PM      Component Value Range Status Comment   Specimen Description BLOOD LEFT ARM   Final    Special Requests BOTTLES DRAWN AEROBIC AND ANAEROBIC 10CC   Final    Culture  Setup Time [id number]   Final    Culture     Final    Value:        BLOOD CULTURE RECEIVED NO GROWTH TO DATE CULTURE WILL BE HELD FOR 5 DAYS BEFORE ISSUING A FINAL NEGATIVE REPORT   Report Status PENDING   Incomplete   CULTURE, BLOOD (ROUTINE X 2)     Status: Normal (Preliminary  result)   Collection Time   05/24/11 12:40 PM      Component Value Range Status Comment   Specimen Description BLOOD RIGHT WRIST   Final    Special Requests BOTTLES DRAWN AEROBIC AND ANAEROBIC 10CC   Final    Culture  Setup Time [id number]   Final    Culture     Final    Value:        BLOOD CULTURE RECEIVED NO GROWTH TO DATE CULTURE WILL BE HELD FOR 5 DAYS BEFORE ISSUING A FINAL NEGATIVE REPORT   Report Status PENDING   Incomplete   CULTURE, RESPIRATORY     Status: Normal   Collection Time   05/24/11  3:01 PM      Component Value Range Status Comment   Specimen Description TRACHEAL ASPIRATE   Final    Special Requests NONE   Final    Gram Stain     Final    Value: FEW WBC PRESENT,BOTH PMN AND MONONUCLEAR     NO SQUAMOUS EPITHELIAL CELLS SEEN     NO ORGANISMS SEEN   Culture RARE CANDIDA ALBICANS   Final    Report Status 05/26/2011 FINAL   Final   CULTURE, BLOOD (ROUTINE X 2)     Status:  Normal (Preliminary result)   Collection Time   05/26/11  5:50 PM      Component Value Range Status Comment   Specimen Description BLOOD LEFT HAND   Final    Special Requests BOTTLES DRAWN AEROBIC ONLY 3CC   Final    Culture  Setup Time [id number]   Final    Culture     Final    Value:        BLOOD CULTURE RECEIVED NO GROWTH TO DATE CULTURE WILL BE HELD FOR 5 DAYS BEFORE ISSUING A FINAL NEGATIVE REPORT   Report Status PENDING   Incomplete   CULTURE, BLOOD (ROUTINE X 2)     Status: Normal (Preliminary result)   Collection Time   05/26/11  6:00 PM      Component Value Range Status Comment   Specimen Description BLOOD LEFT WRIST   Final    Special Requests     Final    Value: BOTTLES DRAWN AEROBIC AND ANAEROBIC 7CC AER 4CC ANA   Culture  Setup Time [id number]   Final    Culture     Final    Value:        BLOOD CULTURE RECEIVED NO GROWTH TO DATE CULTURE WILL BE HELD FOR 5 DAYS BEFORE ISSUING A FINAL NEGATIVE REPORT   Report Status PENDING   Incomplete     Studies/Results: Ct Chest Wo  Contrast  05/27/2011  *RADIOLOGY REPORT*  Clinical Data: Pneumonia  CT CHEST WITHOUT CONTRAST  Technique:  Multidetector CT imaging of the chest was performed following the standard protocol without IV contrast.  Comparison: Chest x-ray same day  Findings: Images of the thoracic inlet are unremarkable. Endotracheal and NG tube in place noted.  The patient is status post median sternotomy.  Atherosclerotic calcifications of the coronary arteries.  NG tube tip is within the distal stomach.  There is no mediastinal hematoma or adenopathy.  Atherosclerotic calcifications of thoracic aorta.  Sagittal images of the spine shows mild degenerative changes thoracic spine.  There is small left pleural effusion.  Moderate sized right pleural effusion.  There is bilateral lower lobe posterior atelectasis or infiltrate right greater than left.  No convincing pulmonary edema.  Mild emphysematous changes are noted bilateral upper lobe.  There are some punctate calcifications in the right lower lobe peripherally.  This may represent calcifications of the visceral pleura or prior granulomatous disease.  Calcified right hilar lymph nodes are noted.  The visualized upper abdomen shows a small punctate calcification within spleen probable from prior granulomatous disease.  The visualized pancreas is unremarkable.  Atherosclerotic calcifications of the splenic artery.  IMPRESSION:  1.  Endotracheal and NG tube in place.  Borderline cardiomegaly. 2.  Small left pleural effusion.  Moderate sized right pleural effusion.  Bilateral lower lobe posterior atelectasis or infiltrate. 3.  Mild emphysematous changes bilateral upper lobe. No convincing pulmonary edema.   4. No mediastinal hematoma or adenopathy.  Calcified right hilar lymph node.  Original Report Authenticated By: Natasha Mead, M.D.   Ct Lumbar Spine Wo Contrast  05/27/2011  *RADIOLOGY REPORT*  Clinical Data: Evaluate for wound infection.  Laminectomy 05/16/2011  CT LUMBAR SPINE  WITHOUT CONTRAST  Technique:  Multidetector CT imaging of the lumbar spine was performed without intravenous contrast administration. Multiplanar CT image reconstructions were also generated.  Comparison: Lumbar MRI 04/13/2011  Findings: Negative for fracture or mass lesion.  Disc degeneration is present at L5-S1 with disc space narrowing.  Endplate sclerosis and cystic changes are  similar to the prior MRI and most likely related to disc degeneration.  No erosive changes are seen suggesting diskitis and osteomyelitis.  Atherosclerotic aorta without aneurysm.  L1-2:  Mild disc and facet degeneration without spinal stenosis.  L2-3:  Mild disc and facet degeneration without significant stenosis.  L3-4:  Mild disc and facet degeneration without significant spinal stenosis.  L4-5:  Mild disc and facet degeneration without significant spinal stenosis.  L5-S1:  Bilateral laminotomy.  No bony erosion is seen.  No significant soft tissue edema or fluid collection is present. Abscess would be better identified by MRI but I see no findings suggestive of deep tissue infection or abscess.  Transplant kidney in the right pelvis.  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  Prior surgery at L5-S1.  There is spondylosis and foraminal narrowing bilaterally at this level.  No evidence of soft tissue edema or abscess.  Original Report Authenticated By: Camelia Phenes, M.D.   Dg Chest Port 1 View  05/29/2011  *RADIOLOGY REPORT*  Clinical Data: Evaluate endotracheal tube position.  Shortness of breath.  PORTABLE CHEST - 1 VIEW  Comparison: Chest x-ray 05/28/2011.  Findings: Tip of endotracheal tube is approximately 4.9 cm above the carina.  Left internal jugular central venous catheter with tip in the distal left innominate vein again noted. A nasogastric tube is seen extending into the stomach, however, the tip of the nasogastric tube extends below the lower margin of the image. Moderate right-sided pleural  effusion is unchanged.  Small left pleural effusion unchanged.  Persistent bibasilar opacities (right greater than left) may represent areas of atelectasis and/or sequelae of aspiration.  Again noted is some pulmonary vascular congestion, however, there is no evidence of frank edema at this time.  Heart size is borderline enlarged (unchanged). The patient is rotated to the left on today's exam, resulting in distortion of the mediastinal contours and reduced diagnostic sensitivity and specificity for mediastinal pathology.  Atherosclerotic calcifications within the arch of the aorta.  The patient is status post median sternotomy for CABG.  IMPRESSION: 1.  While there continues to be some pulmonary vascular congestion, there is no longer evidence of frank pulmonary edema. 2.  Persistent bibasilar opacities (right greater than left) which may reflect areas of atelectasis and/or sequelae of aspiration. 3.  Moderate right-sided pleural effusion is unchanged.  Original Report Authenticated By: Florencia Reasons, M.D.   Dg Chest Port 1 View  05/28/2011  *RADIOLOGY REPORT*  Clinical Data: evaluate ET tube placement  PORTABLE CHEST - 1 VIEW  Comparison: 05/27/2011  Findings:  There is a left IJ catheter with tip in the projection of the SVC.  There is an ET tube with tip above the carina.  Heart size appears normal.  Diffuse interstitial edema pattern and right pleural effusion are unchanged from previous exam.  IMPRESSION:  1.  No significant change in CHF pattern compared with previous exam.  Original Report Authenticated By: Rosealee Albee, M.D.      Assessment/Plan: John [name] is a 71 y.o. male with COPD, renal transplant who underwent laminectomy on 26th of March complicated by respiratory failure, intubation, who has been febrile and on broad spectrum antibiotics including vanco/zosyn >8 days and now mycafungin. Urine cx only small amt of morganella. CT chest abdomen pelvis and L spine all  unremarkable   1)FUO: Fever curve is improved but we dont know what we are treating here. If he continues to remain afebrile with negative cultures and  finding I will de-escalate his antibiotics  2) Goals of care: wife was crying, tearful and is having difficult time making decisions on global issues of ? Do tracheostomy etc. Reinforce conversations with CCM and I would recommend palliative care consult as well if primary OK with that. Wife would find it helpful.      LOS: 13 days   Acey Lav 05/29/2011, 9:38 AM

## 2011-05-29 NOTE — Progress Notes (Signed)
[facility]    Subjective:  Objective: Vital signs in last 24 hours: Blood pressure 124/43, pulse 81, temperature 99.9 F (37.7 C), temperature source Axillary, resp. rate 15, height 6\' 2"  (1.88 m), weight 98.3 kg (216 lb 11.4 oz), SpO2 100.00%. CVP 10   PHYSICAL EXAM General--Intubated, sedated, wife at bedside.  I encouraged her to proceed with tracheostomy later this weeek Chest--rhonchi Heart--no rub Abd--nontender Extr--no edema  Lab Results:   Lab 05/29/11 0430 05/28/11 0530 05/27/11 0515  NA 153* 150* 147*  K 3.8 3.7 3.6  CL 113* 109 106  CO2 34* 34* 34*  BUN 52* 60* 59*  CREATININE 1.26 1.45* 1.48*  ALB -- -- --  GLUCOSE 312* -- --  CALCIUM 9.8 9.8 9.7  PHOS 2.6 2.4 2.5     Basename 05/29/11 0430 05/28/11 0530  WBC 13.0* 11.6*  HGB 8.8* 8.7*  HCT 30.3* 29.1*  PLT 238 265    Assessment/Plan: 1.  S/P renal transplant--renal fct ok on cell cept, prograf, and steroids.  Will check prograf level 2.  Anemia--check Fe studies 3.  VDRF--CXR show large R pl eff (tapped in past), bibasilar atelectasis/infiltr, plus less pulm vasc. Congestion.   CVP 10.  No diuretics at this point.  Remains on zosyn, vanco, and micafungin.  Thoracentesis may be helpful if pulm agrees.  Encouraged tracheostomy 4.  Hypernatremia--I>O, receiving hypotonic fluids 5.  DM--off pump, on  SSI 6.  Parkinsonism with orthostatic hypotension--on meds 7.  S/P laminectomy 26 Mar--per neurosurg.   LOS: 13 days   Gregory Dowe F 05/29/2011,11:13 AM   .labalb

## 2011-05-29 NOTE — Progress Notes (Addendum)
Glycemic Control Recommendations   Patient has history of insulin pump.  Has CHO ratio of 1 unit of insulin for 10 grams of CHOs.  Recommendations:    When patient transitioned of IV inuslin, add Novolog q4h for Tube feed coverage.

## 2011-05-29 NOTE — Progress Notes (Addendum)
HPI: 71 yo male with hx COPD, renal transplant (2002), DM on insulin pump who presented 3/26 for lumbar laminotomies and decompression L5 and S1 r/t spondylosis and herniated nucleus pulposus. Post op developed episodes of confusion and respiratory distress thought r/t CHF Which initially improved with lasix. On 3/31 developed worsening respiratory distress and hypoxia and PCCM consulted.   Antibiotics:  Vanc 3/31>>>  Zosyn 3/31>>>  4/7 micofungin>>>  Cultures/Sepsis Markers:   Urine 3/29>>> MORGANELLA MORGANII (6,000 COLONIES/ML)  BCx2 3/28>>> negative  Sputum 3/31>>>neg    Access/Protocols:  L IJ CVL>> 3/31  ET tube>> 4/3  Best Practice:  DVT: SCD  GI: Pantoprazole, Colace   Subjective: agitated with WUA.  Failed SBT again  Physical Exam: Filed Vitals:   05/29/11 0700  BP: 136/47  Pulse: 80  Temp:   Resp: 23    Intake/Output Summary (Last 24 hours) at 05/29/11 0807 Last data filed at 05/29/11 0700  Gross per 24 hour  Intake 3568.42 ml  Output   1535 ml  Net 2033.42 ml   Vent Mode:  [-] PRVC FiO2 (%):  [40 %] 40 % Set Rate:  [12 bmp] 12 bmp Vt Set:  [580 mL] 580 mL PEEP:  [5 cmH20] 5 cmH20 Pressure Support:  [12 cmH20] 12 cmH20 Plateau Pressure:  [21 cmH20-30 cmH20] 23 cmH20  General: chronically ill appearing male, AOx1. Neuro: sedated on vent  CV: irregularly irregular, no m/r/g.  PULM: resps even, mildly labored, diminished throughout with few scattered ronchi.  GI: abd soft, +bs.  Extremities: Warm and dry, trace BLE edema.   Labs: CBC:    Component Value Date/Time   WBC 13.0* 05/29/2011 0430   HGB 8.8* 05/29/2011 0430   HCT 30.3* 05/29/2011 0430   PLT 238 05/29/2011 0430   MCV 72.3* 05/29/2011 0430   NEUTROABS 9.1* 05/24/2011 0250   LYMPHSABS 1.7 05/24/2011 0250   MONOABS 1.3* 05/24/2011 0250   EOSABS 0.5 05/24/2011 0250   BASOSABS 0.0 05/24/2011 0250   Basic Metabolic Panel:    Component Value Date/Time   NA 153* 05/29/2011 0430   K 3.8 05/29/2011 0430   CL  113* 05/29/2011 0430   CO2 34* 05/29/2011 0430   BUN 52* 05/29/2011 0430   CREATININE 1.26 05/29/2011 0430   GLUCOSE 312* 05/29/2011 0430   CALCIUM 9.8 05/29/2011 0430   Comprehensive Metabolic Panel:    Component Value Date/Time   NA 153* 05/29/2011 0430   K 3.8 05/29/2011 0430   CL 113* 05/29/2011 0430   CO2 34* 05/29/2011 0430   BUN 52* 05/29/2011 0430   CREATININE 1.26 05/29/2011 0430   GLUCOSE 312* 05/29/2011 0430   CALCIUM 9.8 05/29/2011 0430   AST 16 05/20/2011 0836   ALT 6 05/20/2011 0836   ALKPHOS 121* 05/20/2011 0836   BILITOT 0.8 05/20/2011 0836   PROT 6.3 05/20/2011 0836   ALBUMIN 2.6* 05/24/2011 0250   ABG    Component Value Date/Time   PHART 7.449 05/28/2011 0505   PCO2ART 47.2* 05/28/2011 0505   PO2ART 140.0* 05/28/2011 0505   HCO3 32.2* 05/28/2011 0505   TCO2 33.6 05/28/2011 0505   ACIDBASEDEF 1.0 04/22/2009 0905   O2SAT 99.4 05/28/2011 0505    Lab 05/29/11 0430  MG 2.5   Lab Results  Component Value Date   CALCIUM 9.8 05/29/2011   PHOS 2.6 05/29/2011   CT CHEST 05/27/11 IMPRESSION:  1. Endotracheal and NG tube in place. Borderline cardiomegaly.  2. Small left pleural effusion. Moderate sized right pleural effusion.  Bilateral lower lobe posterior atelectasis or infiltrate.  3. Mild emphysematous changes bilateral upper lobe. No convincing pulmonary edema.  4. No mediastinal hematoma or adenopathy. Calcified right hilar lymph node.   Assessment & Plan:  Acute respiratory failure  Likely multifactorial in setting volume overload with underlying COPD +/- HCAP post lumbar surgery in immunosuppressed patient. WBC elevated, Temp again. CXR shows diffuse edema without clear focal infiltrate, intubated 4/3 with respiratory distress.  -need to discuss trach with the family -Would need LTAC care and long term vent support  DM  home on insulin pump.  - Hold insulin pump while critically ill.   -Will need insulin drip.  HTN  - Continue to monitor  Chronic renal insufficiency s/p renal transplant 2002   Renal following.  - Cont cellcept, prograf.  - Hold diuresis as above. -increase free water  -change TF to nepro  Fever/Leukocytosis  - Per ID svc , now on micofungin/vanco/zosyn  Best practices / Disposition  -->ICU status under PCCM  -->full code >>>>will discuss EOL with spouse -->SCD's for DVT Px  -->Protonix and Colace for GI Px  CC time 35 minutes.  Shan Levans, M.D. Beeper  [phone]  Cell  [phone]  If no response or cell goes to voicemail, call beeper [phone]

## 2011-05-30 ENCOUNTER — Inpatient Hospital Stay (HOSPITAL_COMMUNITY): Payer: Medicare Other

## 2011-05-30 DIAGNOSIS — F05 Delirium due to known physiological condition: Secondary | ICD-10-CM | POA: Diagnosis not present

## 2011-05-30 DIAGNOSIS — Z9911 Dependence on respirator [ventilator] status: Secondary | ICD-10-CM

## 2011-05-30 LAB — GLUCOSE, CAPILLARY
Glucose-Capillary: 129 mg/dL — ABNORMAL HIGH (ref 70–99)
Glucose-Capillary: 133 mg/dL — ABNORMAL HIGH (ref 70–99)
Glucose-Capillary: 167 mg/dL — ABNORMAL HIGH (ref 70–99)
Glucose-Capillary: 169 mg/dL — ABNORMAL HIGH (ref 70–99)
Glucose-Capillary: 208 mg/dL — ABNORMAL HIGH (ref 70–99)
Glucose-Capillary: 125 mg/dL — ABNORMAL HIGH (ref 70–99)
Glucose-Capillary: 131 mg/dL — ABNORMAL HIGH (ref 70–99)
Glucose-Capillary: 146 mg/dL — ABNORMAL HIGH (ref 70–99)
Glucose-Capillary: 266 mg/dL — ABNORMAL HIGH (ref 70–99)
Glucose-Capillary: 242 mg/dL — ABNORMAL HIGH (ref 70–99)

## 2011-05-30 LAB — BASIC METABOLIC PANEL
BUN: 42 mg/dL — ABNORMAL HIGH (ref 6–23)
Creatinine, Ser: 1.14 mg/dL (ref 0.50–1.35)
GFR calc Af Amer: 73 mL/min — ABNORMAL LOW (ref >90)
GFR calc non Af Amer: 63 mL/min — ABNORMAL LOW (ref >90)
Potassium: 3.6 mEq/L (ref 3.5–5.1)

## 2011-05-30 LAB — CULTURE, BLOOD (ROUTINE X 2)
Culture  Setup Time: 201304031628
Culture: NO GROWTH

## 2011-05-30 LAB — IRON AND TIBC: Iron: <10 ug/dL — ABNORMAL LOW (ref 42–135)

## 2011-05-30 LAB — CBC
Hemoglobin: 8 g/dL — ABNORMAL LOW (ref 13.0–17.0)
MCHC: 29.2 g/dL — ABNORMAL LOW (ref 30.0–36.0)
RDW: 19.2 % — ABNORMAL HIGH (ref 11.5–15.5)

## 2011-05-30 LAB — FERRITIN: Ferritin: 649 ng/mL — ABNORMAL HIGH (ref 22–322)

## 2011-05-30 MED ORDER — INSULIN GLARGINE 100 UNIT/ML ~~LOC~~ SOLN
20.0000 [IU] | Freq: Every day | SUBCUTANEOUS | Status: DC
  Administered 2011-05-31 – 2011-06-01 (×2): 20 [IU] via SUBCUTANEOUS

## 2011-05-30 MED ORDER — MIDAZOLAM HCL 2 MG/2ML IJ SOLN
1.0000 mg | INTRAMUSCULAR | Status: DC | PRN
  Administered 2011-05-30: 2 mg via INTRAVENOUS
  Administered 2011-06-01: 1 mg via INTRAVENOUS
  Filled 2011-05-30 (×2): qty 2

## 2011-05-30 MED ORDER — MYCOPHENOLATE MOFETIL 200 MG/ML PO SUSR
500.0000 mg | Freq: Two times a day (BID) | ORAL | Status: DC
  Administered 2011-05-30 – 2011-06-01 (×6): 500 mg via ORAL
  Filled 2011-05-30 (×12): qty 2.5

## 2011-05-30 MED ORDER — INSULIN ASPART 100 UNIT/ML ~~LOC~~ SOLN
0.0000 [IU] | SUBCUTANEOUS | Status: DC
  Administered 2011-05-30: 1 [IU] via SUBCUTANEOUS

## 2011-05-30 MED ORDER — QUETIAPINE FUMARATE 100 MG PO TABS
100.0000 mg | ORAL_TABLET | Freq: Every day | ORAL | Status: DC
  Administered 2011-05-30 – 2011-06-07 (×8): 100 mg via ORAL
  Filled 2011-05-30 (×10): qty 1

## 2011-05-30 MED ORDER — MYCOPHENOLATE 200 MG/ML ORAL SUSPENSION
500.0000 mg | Freq: Two times a day (BID) | ORAL | Status: DC
  Filled 2011-05-30 (×2): qty 10

## 2011-05-30 MED ORDER — SODIUM CHLORIDE 0.9 % IV SOLN
50.0000 ug/h | INTRAVENOUS | Status: DC
  Administered 2011-05-31: 100 ug/h via INTRAVENOUS
  Filled 2011-05-30 (×2): qty 50

## 2011-05-30 MED ORDER — DEXTROSE 10 % IV SOLN: INTRAVENOUS | Status: DC

## 2011-05-30 MED ORDER — IPRATROPIUM BROMIDE 0.02 % IN SOLN
0.5000 mg | Freq: Four times a day (QID) | RESPIRATORY_TRACT | Status: DC
  Administered 2011-05-30 – 2011-06-08 (×31): 0.5 mg via RESPIRATORY_TRACT
  Filled 2011-05-30 (×30): qty 2.5

## 2011-05-30 MED ORDER — IOHEXOL 300 MG/ML  SOLN
20.0000 mL | INTRAMUSCULAR | Status: AC
  Administered 2011-05-30 (×2): 20 mL via ORAL

## 2011-05-30 MED ORDER — SODIUM CHLORIDE 0.9 % IV SOLN
0.2000 ug/kg/h | INTRAVENOUS | Status: AC
  Administered 2011-05-30: 1 ug/kg/h via INTRAVENOUS
  Administered 2011-05-30: 0.5 ug/kg/h via INTRAVENOUS
  Administered 2011-05-30 – 2011-05-31 (×5): 1 ug/kg/h via INTRAVENOUS
  Filled 2011-05-30 (×7): qty 2

## 2011-05-30 MED ORDER — INSULIN ASPART 100 UNIT/ML ~~LOC~~ SOLN
0.0000 [IU] | SUBCUTANEOUS | Status: DC
  Administered 2011-05-30 (×2): 4 [IU] via SUBCUTANEOUS
  Administered 2011-05-31: 7 [IU] via SUBCUTANEOUS
  Administered 2011-05-31 (×2): 4 [IU] via SUBCUTANEOUS
  Administered 2011-05-31 (×2): 11 [IU] via SUBCUTANEOUS
  Administered 2011-05-31: 7 [IU] via SUBCUTANEOUS
  Administered 2011-05-31: 4 [IU] via SUBCUTANEOUS
  Administered 2011-06-01 (×2): 7 [IU] via SUBCUTANEOUS
  Administered 2011-06-01: 4 [IU] via SUBCUTANEOUS
  Administered 2011-06-01: 11 [IU] via SUBCUTANEOUS
  Administered 2011-06-01 – 2011-06-02 (×2): 7 [IU] via SUBCUTANEOUS
  Administered 2011-06-02: 4 [IU] via SUBCUTANEOUS

## 2011-05-30 MED ORDER — ACETAMINOPHEN 325 MG PO TABS: 650.0000 mg | ORAL_TABLET | ORAL | Status: DC | PRN

## 2011-05-30 MED ORDER — ALBUTEROL SULFATE (5 MG/ML) 0.5% IN NEBU
2.5000 mg | INHALATION_SOLUTION | Freq: Four times a day (QID) | RESPIRATORY_TRACT | Status: DC
  Administered 2011-05-30 – 2011-06-08 (×31): 2.5 mg via RESPIRATORY_TRACT
  Filled 2011-05-30 (×31): qty 0.5

## 2011-05-30 MED ORDER — ALBUTEROL SULFATE (5 MG/ML) 0.5% IN NEBU: 2.5000 mg | INHALATION_SOLUTION | RESPIRATORY_TRACT | Status: DC | PRN

## 2011-05-30 NOTE — Progress Notes (Addendum)
CSW met with pt's wife at bedside. Pt remains intubated and sedated. Pt's wife shared that pt will have a trach placed and will eventually be moved to [facility] for further treatment. CSW provided psychosocial support to pt's wife. CSW will continue to follow to provide support during this admission. CSW will also address discharge plans as needed. Currently, discharge plan is for pt to transfer to [facility]. Please call if an urgent need arises.   Dede Query, MSW, [facility] [phone]  Addendum: Pt has changed services, therefore pt will have a new clinical social worker. CSW completed handoff with PCCM CSW.  Dede Query, MSW, [facility] [phone]

## 2011-05-30 NOTE — Progress Notes (Signed)
HPI: 71 yo male with hx COPD, renal transplant (2002), DM on insulin pump who presented 3/26 for lumbar laminotomies and decompression L5 and S1 r/t spondylosis and herniated nucleus pulposus. Post op developed episodes of confusion and respiratory distress thought r/t CHF Which initially improved with lasix. On 3/31 developed worsening respiratory distress and hypoxia and PCCM consulted.   Antibiotics:  Vanc 3/31>>>  Zosyn 3/31>>>  4/7 micofungin>>>  Cultures/Sepsis Markers:   Urine 3/29>>> MORGANELLA MORGANII (6,000 COLONIES/ML)  BCx2 3/28>>> negative  Sputum 3/31>>>neg    Access/Protocols:  L IJ CVL>> 3/31  ET tube>> 4/3  Best Practice:  DVT: SCD  GI: Pantoprazole, Colace   Subjective: agitated with WUA.  Failed SBT again  Physical Exam: Filed Vitals:   05/30/11 0818  BP:   Pulse:   Temp: 99.5 F (37.5 C)  Resp:     Intake/Output Summary (Last 24 hours) at 05/30/11 0906 Last data filed at 05/30/11 0800  Gross per 24 hour  Intake 5248.11 ml  Output   1825 ml  Net 3423.11 ml   Vent Mode:  [-] PRVC FiO2 (%):  [40 %] 40 % Set Rate:  [12 bmp] 12 bmp Vt Set:  [580 mL] 580 mL PEEP:  [5 cmH20] 5 cmH20 Pressure Support:  [15 cmH20] 15 cmH20 Plateau Pressure:  [12 cmH20-31 cmH20] 31 cmH20  General: chronically ill appearing male, AOx1. Neuro: sedated on vent  CV: irregularly irregular, no m/r/g.  PULM: resps even, mildly labored, diminished throughout with few scattered ronchi.  GI: abd soft, +bs.  Extremities: Warm and dry, trace BLE edema.   Labs: CBC:    Component Value Date/Time   WBC 11.8* 05/30/2011 0420   HGB 8.0* 05/30/2011 0420   HCT 27.4* 05/30/2011 0420   PLT 222 05/30/2011 0420   MCV 71.9* 05/30/2011 0420   NEUTROABS 9.1* 05/24/2011 0250   LYMPHSABS 1.7 05/24/2011 0250   MONOABS 1.3* 05/24/2011 0250   EOSABS 0.5 05/24/2011 0250   BASOSABS 0.0 05/24/2011 0250   Basic Metabolic Panel:    Component Value Date/Time   NA 153* 05/30/2011 0420   K 3.6 05/30/2011  0420   CL 113* 05/30/2011 0420   CO2 34* 05/30/2011 0420   BUN 42* 05/30/2011 0420   CREATININE 1.14 05/30/2011 0420   GLUCOSE 143* 05/30/2011 0420   CALCIUM 9.7 05/30/2011 0420   Comprehensive Metabolic Panel:    Component Value Date/Time   NA 153* 05/30/2011 0420   K 3.6 05/30/2011 0420   CL 113* 05/30/2011 0420   CO2 34* 05/30/2011 0420   BUN 42* 05/30/2011 0420   CREATININE 1.14 05/30/2011 0420   GLUCOSE 143* 05/30/2011 0420   CALCIUM 9.7 05/30/2011 0420   AST 16 05/20/2011 0836   ALT 6 05/20/2011 0836   ALKPHOS 121* 05/20/2011 0836   BILITOT 0.8 05/20/2011 0836   PROT 6.3 05/20/2011 0836   ALBUMIN 2.6* 05/24/2011 0250   ABG    Component Value Date/Time   PHART 7.449 05/28/2011 0505   PCO2ART 47.2* 05/28/2011 0505   PO2ART 140.0* 05/28/2011 0505   HCO3 32.2* 05/28/2011 0505   TCO2 33.6 05/28/2011 0505   ACIDBASEDEF 1.0 04/22/2009 0905   O2SAT 99.4 05/28/2011 0505    Lab 05/29/11 0430  MG 2.5   Lab Results  Component Value Date   CALCIUM 9.7 05/30/2011   PHOS 2.6 05/29/2011   CT CHEST 05/27/11 IMPRESSION:  1. Endotracheal and NG tube in place. Borderline cardiomegaly.  2. Small left pleural effusion. Moderate sized  right pleural effusion. Bilateral lower lobe posterior atelectasis or infiltrate.  3. Mild emphysematous changes bilateral upper lobe. No convincing pulmonary edema.  4. No mediastinal hematoma or adenopathy. Calcified right hilar lymph node.   Assessment & Plan:  Acute respiratory failure  Likely multifactorial in setting volume overload with underlying COPD +/- HCAP post lumbar surgery in immunosuppressed patient intubated 4/3 with respiratory distress. WBC elevated, TMax overnight 100.9. CXR shows mild edema with small right effusion, not amenable to thoracentesis. Given persistent failure to wean given mental status, change sedation to precedex, midazolam, fentanyl fixed dose and add Seroquel. Attempt to wean again in AM. If fails to wean, may need trach.   DM  home on insulin pump.  - Hold  insulin pump while critically ill.  - Continue insulin, if CBG rises, consider starting Lantus  Chronic renal insufficiency s/p renal transplant 2002  Renal following.  - Cont cellcept, prograf.  - Hold diuresis as above. - continue free water   Fever/Leukocytosis  - Per ID, now on micofungin/vanco/zosyn  Best practices / Disposition  -->ICU status under PCCM  -->full code >>>>will discuss EOL with spouse -->SCD's for DVT Px  -->Protonix and Colace for GI Px    TOBBIA,PATRICK, M.D.   Pt seen and examined and database reviewed. I agree with above findings, assessment and plan. Seen on CCM rounds this morning with resident MD. Our discussed plan is reflected in the note above. 30 mins CCM time. Wife updated in detail @ bedside  Billy Fischer, MD;  PCCM service; Mobile [phone]

## 2011-05-30 NOTE — Progress Notes (Addendum)
[facility]  Subjective:  Intubated, sedated.  Responds to voice, but obtunded T 100.9 at 3 AM.  Wife at bedside Weight today 212 lb.  He weighed 221 lb in my office Jan 2013  Objective: Vital signs in last 24 hours: Blood pressure 133/63, pulse 77, temperature 99.5 F (37.5 C), temperature source Axillary, resp. rate 13, height 6\' 2"  (1.88 m), weight 97.6 kg (215 lb 2.7 oz), SpO2 100.00%.    PHYSICAL EXAM General--intubated sedated Chest--rhonchi Heart--2/6 syst murmur Abd--nontender, transplant in RLQ Extr--no edema  Lab Results:   Lab 05/30/11 0420 05/29/11 0430 05/28/11 0530 05/27/11 0515  NA 153* 153* 150* --  K 3.6 3.8 3.7 --  CL 113* 113* 109 --  CO2 34* 34* 34* --  BUN 42* 52* 60* --  CREATININE 1.14 1.26 1.45* --  ALB -- -- -- --  GLUCOSE 143* -- -- --  CALCIUM 9.7 9.8 9.8 --  PHOS -- 2.6 2.4 2.5     Basename 05/30/11 0420 05/29/11 0430  WBC 11.8* 13.0*  HGB 8.0* 8.8*  HCT 27.4* 30.3*  PLT 222 238  :  Assessment/Plan: 1. S/P renal transplant--renal fct ok on cell cept, prograf, and steroids. Will check prograf level  2. Anemia--HGB 8,  Fe/TIBC 12 % sat, ferritin pending 3. VDRF--CXR show large R pl eff (tapped in past), bibasilar atelectasis/infiltr, plus less pulm vasc. Congestion. CVP 10. No diuretics at this point. Remains on zosyn, vanco, and micafungin. Thoracentesis may be helpful if pulm agrees. Encouraged tracheostomy to wife 4. Hypernatremia--I>O, receiving hypotonic fluids  5. DM--off pump, on SSI + insulin drip 6. 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 S/P laminectomy 26 Mar--per neurosurg. 8.  Fever (T100.9 at 3 AM 9 Apr).  Continue AB.  Does he need thoracentesis?     LOS: 14 days   Deseray Daponte F 05/30/2011,8:44 AM   .labalb

## 2011-05-30 NOTE — Progress Notes (Signed)
Patient ID: John [name], male   DOB: 06-21-1940, 71 y.o.   MRN: [id number] Stable. On the respirator. Moves all 4 extremities. Open eyes to voice. Not f/c. Spoke with wife . She has an appointment with CCM.

## 2011-05-30 NOTE — Progress Notes (Signed)
Subjective: obtunded   Antibiotics:  Anti-infectives     Start     Dose/Rate Route Frequency Ordered Stop   05/29/11 1800   vancomycin (VANCOCIN) 750 mg in sodium chloride 0.9 % 150 mL IVPB        750 mg 150 mL/hr over 60 Minutes Intravenous Every 12 hours 05/28/11 2304     05/28/11 1015   micafungin (MYCAMINE) 100 mg in sodium chloride 0.9 % 100 mL IVPB     Comments: Please ask pharmacy to assist with dosing.      100 mg 100 mL/hr over 1 Hours Intravenous Daily 05/28/11 1014     05/24/11 2200   vancomycin (VANCOCIN) 1,250 mg in sodium chloride 0.9 % 250 mL IVPB  Status:  Discontinued        1,250 mg 166.7 mL/hr over 90 Minutes Intravenous Every 24 hours 05/24/11 1922 05/28/11 2304   05/24/11 1800   vancomycin (VANCOCIN) 1,250 mg in sodium chloride 0.9 % 250 mL IVPB  Status:  Discontinued        1,250 mg 166.7 mL/hr over 90 Minutes Intravenous Every 24 hours 05/24/11 0141 05/24/11 0940   05/21/11 1000  piperacillin-tazobactam (ZOSYN) IVPB 3.375 g       3.375 g 12.5 mL/hr over 240 Minutes Intravenous Every 8 hours 05/21/11 0855     05/21/11 1000   vancomycin (VANCOCIN) IVPB 1000 mg/200 mL premix  Status:  Discontinued        1,000 mg 200 mL/hr over 60 Minutes Intravenous Every 12 hours 05/21/11 0855 05/23/11 1101   05/16/11 0932   bacitracin 50,000 Units in sodium chloride irrigation 0.9 % 500 mL irrigation  Status:  Discontinued          As needed 05/16/11 0932 05/16/11 1055   05/16/11 0700   bacitracin 40981 UNITS injection     Comments: Imagene Gurney: cabinet override         05/16/11 0700 05/16/11 1914   05/16/11 0000   ceFAZolin (ANCEF) IVPB 2 g/50 mL premix        2 g 100 mL/hr over 30 Minutes Intravenous 60 min pre-op 05/15/11 1449 05/16/11 0828          Medications: Scheduled Meds:    . antiseptic oral rinse  1 application Mouth Rinse QID  . aspirin  81 mg Per Tube Daily  . calcium carbonate  2 tablet Per Tube Daily  . carbidopa-levodopa  1 tablet Per  Tube TID  . chlorhexidine  15 mL Mouth/Throat BID  . docusate  100 mg Per Tube BID  . free water  250 mL Per Tube Q4H  . hydrocortisone sod succinate (SOLU-CORTEF) injection  20 mg Intravenous Daily  . insulin aspart  0-4 Units Subcutaneous Q4H  . ipratropium  0.5 mg Nebulization QID  . lamoTRIgine  200 mg Oral Daily  . levalbuterol  0.63 mg Nebulization QID  . linagliptin  5 mg Oral Daily  . micafungin [facility]) IV  100 mg Intravenous Daily  . mycophenolate (CELLCEPT) IVPB  500 mg Intravenous Q12H  . pantoprazole sodium  40 mg Per Tube Q1200  . piperacillin-tazobactam (ZOSYN)  IV  3.375 g Intravenous Q8H  . pyridostigmine  30 mg Oral TID  . senna  1 tablet Oral BID  . tacrolimus  5 mg Oral BID  . Tamsulosin HCl  0.4 mg Oral Daily  . vancomycin  750 mg Intravenous Q12H  . DISCONTD: insulin aspart  0-10 Units Subcutaneous Q4H   Continuous Infusions:    .  sodium chloride 10 mL/hr at 05/28/11 1326  . sodium chloride 10 mL/hr at 05/29/11 0141  . sodium chloride 10 mL/hr at 05/30/11 0700  . dextrose    . dextrose    . feeding supplement (NEPRO CARB STEADY) 1,000 mL (05/30/11 0846)  . fentaNYL infusion INTRAVENOUS 200 mcg/hr (05/30/11 0800)  . insulin (NOVOLIN-R) infusion Stopped (05/30/11 0400)  . midazolam (VERSED) infusion 5 mg/hr (05/30/11 0800)   PRN Meds:.acetaminophen (TYLENOL) oral liquid 160 mg/5 mL, acetaminophen, acetaminophen, hydrALAZINE, hydrocortisone cream, magnesium hydroxide, menthol-cetylpyridinium, ondansetron (ZOFRAN) IV, phenol   Objective: Weight change: -1 lb 8.7 oz (-0.7 kg)  Intake/Output Summary (Last 24 hours) at 05/30/11 0919 Last data filed at 05/30/11 0800  Gross per 24 hour  Intake 5248.11 ml  Output   1825 ml  Net 3423.11 ml   Blood pressure 133/63, pulse 77, temperature 99.5 F (37.5 C), temperature source Axillary, resp. rate 13, height 6\' 2"  (1.88 m), weight 215 lb 2.7 oz (97.6 kg), SpO2 100.00%. Temp:  [98.4 F (36.9 C)-100.9 F (38.3  C)] 99.5 F (37.5 C) (04/09 0818) Pulse Rate:  [68-87] 77  (04/09 0800) Resp:  [10-24] 13  (04/09 0800) BP: (99-159)/(37-79) 133/63 mmHg (04/09 0800) SpO2:  [98 %-100 %] 100 % (04/09 0818) FiO2 (%):  [40 %] 40 % (04/09 0800) Weight:  [215 lb 2.7 oz (97.6 kg)] 215 lb 2.7 oz (97.6 kg) (04/09 0400)  Physical Exam: General:  awake, trying to get at things with hands which are in mits HEENT: anicteric sclera, pupils reactive to light and accommodation, EOMI CVS regular rate, normal r,  no murmur rubs or gallops Chest: fairly  clear to auscultation bilaterally,Abdomen: soft ? Tender? , nondistended, normal bowel sounds, Extremities: no  clubbing or edema noted bilaterally Skin: no rashes, surgical site is clean Neuro: delirious but not focal deficitis  Lab Results:  Basename 05/30/11 0420 05/29/11 0430  WBC 11.8* 13.0*  HGB 8.0* 8.8*  HCT 27.4* 30.3*  PLT 222 238    BMET  Basename 05/30/11 0420 05/29/11 0430  NA 153* 153*  K 3.6 3.8  CL 113* 113*  CO2 34* 34*  GLUCOSE 143* 312*  BUN 42* 52*  CREATININE 1.14 1.26  CALCIUM 9.7 9.8    Micro Results: Recent Results (from the past 240 hour(s))  MRSA PCR SCREENING     Status: Normal   Collection Time   05/21/11  9:50 AM      Component Value Range Status Comment   MRSA by PCR NEGATIVE  NEGATIVE  Final   CULTURE, BLOOD (ROUTINE X 2)     Status: Normal   Collection Time   05/24/11 12:40 PM      Component Value Range Status Comment   Specimen Description BLOOD LEFT ARM   Final    Special Requests BOTTLES DRAWN AEROBIC AND ANAEROBIC 10CC   Final    Culture  Setup Time [id number]   Final    Culture NO GROWTH 5 DAYS   Final    Report Status 05/30/2011 FINAL   Final   CULTURE, BLOOD (ROUTINE X 2)     Status: Normal   Collection Time   05/24/11 12:40 PM      Component Value Range Status Comment   Specimen Description BLOOD RIGHT WRIST   Final    Special Requests BOTTLES DRAWN AEROBIC AND ANAEROBIC 10CC   Final    Culture  Setup  Time [id number]   Final    Culture NO GROWTH 5 DAYS  Final    Report Status 05/30/2011 FINAL   Final   CULTURE, RESPIRATORY     Status: Normal   Collection Time   05/24/11  3:01 PM      Component Value Range Status Comment   Specimen Description TRACHEAL ASPIRATE   Final    Special Requests NONE   Final    Gram Stain     Final    Value: FEW WBC PRESENT,BOTH PMN AND MONONUCLEAR     NO SQUAMOUS EPITHELIAL CELLS SEEN     NO ORGANISMS SEEN   Culture RARE CANDIDA ALBICANS   Final    Report Status 05/26/2011 FINAL   Final   CULTURE, BLOOD (ROUTINE X 2)     Status: Normal (Preliminary result)   Collection Time   05/26/11  5:50 PM      Component Value Range Status Comment   Specimen Description BLOOD LEFT HAND   Final    Special Requests BOTTLES DRAWN AEROBIC ONLY 3CC   Final    Culture  Setup Time [id number]   Final    Culture     Final    Value:        BLOOD CULTURE RECEIVED NO GROWTH TO DATE CULTURE WILL BE HELD FOR 5 DAYS BEFORE ISSUING A FINAL NEGATIVE REPORT   Report Status PENDING   Incomplete   CULTURE, BLOOD (ROUTINE X 2)     Status: Normal (Preliminary result)   Collection Time   05/26/11  6:00 PM      Component Value Range Status Comment   Specimen Description BLOOD LEFT WRIST   Final    Special Requests     Final    Value: BOTTLES DRAWN AEROBIC AND ANAEROBIC 7CC AER 4CC ANA   Culture  Setup Time [id number]   Final    Culture     Final    Value:        BLOOD CULTURE RECEIVED NO GROWTH TO DATE CULTURE WILL BE HELD FOR 5 DAYS BEFORE ISSUING A FINAL NEGATIVE REPORT   Report Status PENDING   Incomplete     Studies/Results: Dg Chest Port 1 View  05/30/2011  *RADIOLOGY REPORT*  Clinical Data: Ventilator.  PORTABLE CHEST - 1 VIEW  Comparison: 05/29/2011  Findings: Endotracheal tube, left central line and NG tube remain in place, unchanged.  Heart is borderline in size.  Prior CABG. Diffuse interstitial edema likely present, unchanged.  Small bilateral pleural effusions are  unchanged.  IMPRESSION: Continued mild interstitial edema.  Small effusions.  Original Report Authenticated By: Cyndie Chime, M.D.   Dg Chest Port 1 View  05/29/2011  *RADIOLOGY REPORT*  Clinical Data: Evaluate endotracheal tube position.  Shortness of breath.  PORTABLE CHEST - 1 VIEW  Comparison: Chest x-ray 05/28/2011.  Findings: Tip of endotracheal tube is approximately 4.9 cm above the carina.  Left internal jugular central venous catheter with tip in the distal left innominate vein again noted. A nasogastric tube is seen extending into the stomach, however, the tip of the nasogastric tube extends below the lower margin of the image. Moderate right-sided pleural effusion is unchanged.  Small left pleural effusion unchanged.  Persistent bibasilar opacities (right greater than left) may represent areas of atelectasis and/or sequelae of aspiration.  Again noted is some pulmonary vascular congestion, however, there is no evidence of frank edema at this time.  Heart size is borderline enlarged (unchanged). The patient is rotated to the left on today's exam, resulting in distortion of the mediastinal contours  and reduced diagnostic sensitivity and specificity for mediastinal pathology.  Atherosclerotic calcifications within the arch of the aorta.  The patient is status post median sternotomy for CABG.  IMPRESSION: 1.  While there continues to be some pulmonary vascular congestion, there is no longer evidence of frank pulmonary edema. 2.  Persistent bibasilar opacities (right greater than left) which may reflect areas of atelectasis and/or sequelae of aspiration. 3.  Moderate right-sided pleural effusion is unchanged.  Original Report Authenticated By: Florencia Reasons, M.D.      Assessment/Plan: ZYRION COEY is a 71 y.o. male with COPD, renal transplant who underwent laminectomy on 26th of March complicated by respiratory failure, intubation, who has been febrile and on broad spectrum antibiotics  including vanco/zosyn >8 days and now mycafungin. Urine cx only small amt of morganella. CT chest abdomen pelvis and L spine all unremarkable   1)FUO: Fever curve continues to be favorable on broad spectrum abx but t we dont know what we are treating here.   --I would like to get CT oral contrast abd/pelvis if he can tolerate it to look for source there --if workup continues to be negative and he remains  afebrile I will de-escalate his antibiotics        LOS: 14 days   Acey Lav 05/30/2011, 9:19 AM

## 2011-05-30 NOTE — Progress Notes (Signed)
Subjective:  Intubated and sedated.  CVP 9 this am. Potential Trach/Peg    Intake/Output Summary (Last 24 hours) at 05/30/11 0855 Last data filed at 05/30/11 0800  Gross per 24 hour  Intake 5340.11 ml  Output   1825 ml  Net 3515.11 ml    Current meds:    . antiseptic oral rinse  1 application Mouth Rinse QID  . aspirin  81 mg Per Tube Daily  . calcium carbonate  2 tablet Per Tube Daily  . carbidopa-levodopa  1 tablet Per Tube TID  . chlorhexidine  15 mL Mouth/Throat BID  . docusate  100 mg Per Tube BID  . free water  250 mL Per Tube Q4H  . hydrocortisone sod succinate (SOLU-CORTEF) injection  20 mg Intravenous Daily  . insulin aspart  0-4 Units Subcutaneous Q4H  . ipratropium  0.5 mg Nebulization QID  . lamoTRIgine  200 mg Oral Daily  . levalbuterol  0.63 mg Nebulization QID  . linagliptin  5 mg Oral Daily  . micafungin [facility]) IV  100 mg Intravenous Daily  . mycophenolate (CELLCEPT) IVPB  500 mg Intravenous Q12H  . pantoprazole sodium  40 mg Per Tube Q1200  . piperacillin-tazobactam (ZOSYN)  IV  3.375 g Intravenous Q8H  . pyridostigmine  30 mg Oral TID  . senna  1 tablet Oral BID  . tacrolimus  5 mg Oral BID  . Tamsulosin HCl  0.4 mg Oral Daily  . vancomycin  750 mg Intravenous Q12H  . DISCONTD: insulin aspart  0-10 Units Subcutaneous Q4H   Infusions:    . sodium chloride 10 mL/hr at 05/28/11 1326  . sodium chloride 10 mL/hr at 05/29/11 0141  . sodium chloride 10 mL/hr at 05/30/11 0700  . dextrose    . dextrose    . feeding supplement (NEPRO CARB STEADY) 1,000 mL (05/30/11 0846)  . fentaNYL infusion INTRAVENOUS 200 mcg/hr (05/30/11 0800)  . insulin (NOVOLIN-R) infusion Stopped (05/30/11 0400)  . midazolam (VERSED) infusion 5 mg/hr (05/30/11 0800)     Objective:  Blood pressure 133/63, pulse 77, temperature 99.5 F (37.5 C), temperature source Axillary, resp. rate 13, height 6\' 2"  (1.88 m), weight 97.6 kg (215 lb 2.7 oz), SpO2 100.00%. Weight change:  -0.7 kg (-1 lb 8.7 oz)  Physical Exam: General:  Well appearing. No resp difficulty HEENT: normal Neck: supple. JVP difficult to see on Vent. . Carotids 2+ bilat; no bruits. No lymphadenopathy or thryomegaly appreciated. Cor: PMI nondisplaced. Regular rate & rhythm. No rubs, gallops or murmurs. Lungs: Rhonchi throughout Abdomen: soft, nontender, nondistended. No hepatosplenomegaly. No bruits or masses. Good bowel sounds. Extremities: no cyanosis, clubbing, rash, edema Neuro: alert & orientedx3, cranial nerves grossly intact. moves all 4 extremities w/o difficulty. Affect pleasant    Lab Results: Basic Metabolic Panel:  Lab 05/30/11 1610 05/29/11 0430 05/28/11 0530 05/27/11 0515 05/26/11 0500 05/25/11 0305  NA 153* 153* 150* 147* 148* --  K 3.6 3.8 -- -- -- --  CL 113* 113* 109 106 107 --  CO2 34* 34* 34* 34* 32 --  GLUCOSE 143* 312* 341* 418* 249* --  BUN 42* 52* 60* 59* 52* --  CREATININE 1.14 1.26 1.45* 1.48* 1.55* --  CALCIUM 9.7 9.8 9.8 9.7 9.9 --  MG -- 2.5 2.6* 2.3 2.1 2.2  PHOS -- 2.6 2.4 2.5 3.3 2.8   Liver Function Tests:  Lab 05/24/11 0250  AST --  ALT --  ALKPHOS --  BILITOT --  PROT --  ALBUMIN  2.6*   No results found for this basename: LIPASE:5,AMYLASE:5 in the last 168 hours No results found for this basename: AMMONIA:5 in the last 168 hours CBC:  Lab 05/30/11 0420 05/29/11 0430 05/28/11 0530 05/27/11 0515 05/26/11 0500 05/24/11 0250  WBC 11.8* 13.0* 11.6* 13.2* 14.3* --  NEUTROABS -- -- -- -- -- 9.1*  HGB 8.0* 8.8* 8.7* 8.8* 9.4* --  HCT 27.4* 30.3* 29.1* 29.5* 31.6* --  MCV 71.9* 72.3* 72.2* 70.9* 72.3* --  PLT 222 238 265 226 242 --   Cardiac Enzymes: No results found for this basename: CKTOTAL:5,CKMB:5,CKMBINDEX:5,TROPONINI:5 in the last 168 hours BNP: No components found with this basename: POCBNP:5 CBG:  Lab 05/30/11 0815 05/30/11 0358 05/30/11 0256 05/30/11 0200 05/30/11 0057  GLUCAP 266* 146* 131* 125* 144*   Microbiology: Lab Results   Component Value Date   CULT        BLOOD CULTURE RECEIVED NO GROWTH TO DATE CULTURE WILL BE HELD FOR 5 DAYS BEFORE ISSUING A FINAL NEGATIVE REPORT 05/26/2011   CULT        BLOOD CULTURE RECEIVED NO GROWTH TO DATE CULTURE WILL BE HELD FOR 5 DAYS BEFORE ISSUING A FINAL NEGATIVE REPORT 05/26/2011   CULT RARE CANDIDA ALBICANS 05/24/2011   CULT NO GROWTH 5 DAYS 05/24/2011   CULT NO GROWTH 5 DAYS 05/24/2011   CULT MORGANELLA MORGANII 05/19/2011    Lab 05/26/11 1800 05/26/11 1750  CULT       BLOOD CULTURE RECEIVED NO GROWTH TO DATE CULTURE WILL BE HELD FOR 5 DAYS BEFORE ISSUING A FINAL NEGATIVE REPORT       BLOOD CULTURE RECEIVED NO GROWTH TO DATE CULTURE WILL BE HELD FOR 5 DAYS BEFORE ISSUING A FINAL NEGATIVE REPORT  SDES BLOOD LEFT WRIST BLOOD LEFT HAND    Imaging: Dg Chest Port 1 View  05/30/2011  *RADIOLOGY REPORT*  Clinical Data: Ventilator.  PORTABLE CHEST - 1 VIEW  Comparison: 05/29/2011  Findings: Endotracheal tube, left central line and NG tube remain in place, unchanged.  Heart is borderline in size.  Prior CABG. Diffuse interstitial edema likely present, unchanged.  Small bilateral pleural effusions are unchanged.  IMPRESSION: Continued mild interstitial edema.  Small effusions.  Original Report Authenticated By: Cyndie Chime, M.D.   Dg Chest Port 1 View  05/29/2011  *RADIOLOGY REPORT*  Clinical Data: Evaluate endotracheal tube position.  Shortness of breath.  PORTABLE CHEST - 1 VIEW  Comparison: Chest x-ray 05/28/2011.  Findings: Tip of endotracheal tube is approximately 4.9 cm above the carina.  Left internal jugular central venous catheter with tip in the distal left innominate vein again noted. A nasogastric tube is seen extending into the stomach, however, the tip of the nasogastric tube extends below the lower margin of the image. Moderate right-sided pleural effusion is unchanged.  Small left pleural effusion unchanged.  Persistent bibasilar opacities (right greater than left) may represent  areas of atelectasis and/or sequelae of aspiration.  Again noted is some pulmonary vascular congestion, however, there is no evidence of frank edema at this time.  Heart size is borderline enlarged (unchanged). The patient is rotated to the left on today's exam, resulting in distortion of the mediastinal contours and reduced diagnostic sensitivity and specificity for mediastinal pathology.  Atherosclerotic calcifications within the arch of the aorta.  The patient is status post median sternotomy for CABG.  IMPRESSION: 1.  While there continues to be some pulmonary vascular congestion, there is no longer evidence of frank pulmonary edema. 2.  Persistent  bibasilar opacities (right greater than left) which may reflect areas of atelectasis and/or sequelae of aspiration. 3.  Moderate right-sided pleural effusion is unchanged.  Original Report Authenticated By: Florencia Reasons, M.D.     ASSESSMENT/PLAN 71 yo with history of renal transplant, DM, HTN, COPD on home s, diastolic CHF, and CAD s/p CABG is now s/p back surgery. He had post-op delirium and acute respiratory failure with intubation.   1. CHF: CVP 9 now. Creatinine continues to improve. If CVP rises further, would restart lasix IV.  2. Pulmonary: Baseline COPD with superimposed diastolic CHF and possible HCAP versus aspiration-related PNA. Moderate right pleural effusion.  3. Renal: Continues to improve now down 1.14  4. ID: Still no definitive infectious source. ? HCAP. He was started on antifungal. ID continues to follow 5. Hypernatremia: Free water boluses ongoing.  6. Long-term, it is looking like he would be a candidate for tracheostomy/PEG and LTAC.        LOS: 14 days    CLEGG,AMY, NP 05/30/2011, 8:55 AM  Patient seen with NP, agree with above note.  No significant changes clinically today.  CCM to adjust sedation, if no improvement likely trach after 48 hrs or so.   [name] 05/30/2011

## 2011-05-30 NOTE — Progress Notes (Signed)
Nutrition Follow-up  Diet Order:  NPO Nephrology changed to Nepro TF, goal or 30 ml/hr and d/c'd Pros-stat supplements due to decrease in renal functions. Currently this is providing 1296 kcal (61%) and 58 gm (64%) protein. Continues with 250 ml free water bolus q 4 hours. Recommend increasing rate to better meet kcal needs since Scr is now WNL and BUN is trending down.   Meds: Scheduled Meds:   . albuterol  2.5 mg Nebulization Q6H  . antiseptic oral rinse  1 application Mouth Rinse QID  . aspirin  81 mg Per Tube Daily  . calcium carbonate  2 tablet Per Tube Daily  . carbidopa-levodopa  1 tablet Per Tube TID  . chlorhexidine  15 mL Mouth/Throat BID  . free water  250 mL Per Tube Q4H  . hydrocortisone sod succinate (SOLU-CORTEF) injection  20 mg Intravenous Daily  . iohexol  20 mL Oral Q1 Hr x 2  . ipratropium  0.5 mg Nebulization Q6H  . lamoTRIgine  200 mg Oral Daily  . micafungin [facility]) IV  100 mg Intravenous Daily  . mycophenolate  500 mg Per Tube BID  . pantoprazole sodium  40 mg Per Tube Q1200  . piperacillin-tazobactam (ZOSYN)  IV  3.375 g Intravenous Q8H  . pyridostigmine  30 mg Oral TID  . QUEtiapine  100 mg Oral QHS  . tacrolimus  5 mg Oral BID  . vancomycin  750 mg Intravenous Q12H  . DISCONTD: docusate  100 mg Per Tube BID  . DISCONTD: insulin aspart  0-4 Units Subcutaneous Q4H  . DISCONTD: ipratropium  0.5 mg Nebulization QID  . DISCONTD: levalbuterol  0.63 mg Nebulization QID  . DISCONTD: linagliptin  5 mg Oral Daily  . DISCONTD: mycophenolate (CELLCEPT) IVPB  500 mg Intravenous Q12H  . DISCONTD: senna  1 tablet Oral BID  . DISCONTD: Tamsulosin HCl  0.4 mg Oral Daily   Continuous Infusions:   . sodium chloride 10 mL/hr at 05/28/11 1326  . sodium chloride 10 mL/hr at 05/29/11 0141  . sodium chloride 10 mL/hr at 05/30/11 0700  . dexmedetomidine (PRECEDEX) IV infusion    . feeding supplement (NEPRO CARB STEADY) 1,000 mL (05/30/11 0846)  . fentaNYL infusion  INTRAVENOUS    . DISCONTD: dextrose    . DISCONTD: dextrose    . DISCONTD: fentaNYL infusion INTRAVENOUS 200 mcg/hr (05/30/11 1318)  . DISCONTD: insulin (NOVOLIN-R) infusion Stopped (05/30/11 0400)  . DISCONTD: midazolam (VERSED) infusion 5 mg/hr (05/30/11 0800)   PRN Meds:.acetaminophen (TYLENOL) oral liquid 160 mg/5 mL, acetaminophen, albuterol, hydrALAZINE, hydrocortisone cream, midazolam, ondansetron (ZOFRAN) IV, DISCONTD: acetaminophen, DISCONTD: acetaminophen, DISCONTD: magnesium hydroxide, DISCONTD: menthol-cetylpyridinium, DISCONTD: phenol  Labs:  CMP     Component Value Date/Time   NA 153* 05/30/2011 0420   K 3.6 05/30/2011 0420   CL 113* 05/30/2011 0420   CO2 34* 05/30/2011 0420   GLUCOSE 143* 05/30/2011 0420   BUN 42* 05/30/2011 0420   CREATININE 1.14 05/30/2011 0420   CALCIUM 9.7 05/30/2011 0420   PROT 6.3 05/20/2011 0836   ALBUMIN 2.6* 05/24/2011 0250   AST 16 05/20/2011 0836   ALT 6 05/20/2011 0836   ALKPHOS 121* 05/20/2011 0836   BILITOT 0.8 05/20/2011 0836   GFRNONAA 63* 05/30/2011 0420   GFRAA 73* 05/30/2011 0420     Intake/Output Summary (Last 24 hours) at 05/30/11 1450 Last data filed at 05/30/11 1300  Gross per 24 hour  Intake 4687.51 ml  Output   1210 ml  Net 3477.51 ml  Weight Status:  215 lbs, stable, though likely has some weight loss given that he is fluid balance + and diuretics are being held.  Re-estimated needs:  2142 kcal and 90-110 gm protein  Nutrition Dx:  Inadequate oral intake, ongoing  Goal:  EN to provide >90% of estimated nutrition needs, unmet  Intervention:   1. Recommend increasing Nepro to a rate of 45 ml/hr, this will provide 1944 kcal (91%) and 87 gm protein (96%).   Monitor:  TF rate, weight, labs, ?PEG/Trach   Clarene Duke MARIE Pager #:  [phone]

## 2011-05-31 ENCOUNTER — Inpatient Hospital Stay (HOSPITAL_COMMUNITY): Payer: Medicare Other

## 2011-05-31 LAB — MAGNESIUM: Magnesium: 2.2 mg/dL (ref 1.5–2.5)

## 2011-05-31 LAB — HEPARIN LEVEL (UNFRACTIONATED): Heparin Unfractionated: <0.1 IU/mL — ABNORMAL LOW (ref 0.30–0.70)

## 2011-05-31 LAB — CBC
Hemoglobin: 8.5 g/dL — ABNORMAL LOW (ref 13.0–17.0)
RBC: 3.95 MIL/uL — ABNORMAL LOW (ref 4.22–5.81)
WBC: 11.2 10*3/uL — ABNORMAL HIGH (ref 4.0–10.5)

## 2011-05-31 LAB — POCT I-STAT 7, (LYTES, BLD GAS, ICA,H+H)
Calcium, Ion: 1.24 mmol/L (ref 1.12–1.32)
HCT: 34 % — ABNORMAL LOW (ref 39.0–52.0)
Patient temperature: 36
Potassium: 3.5 mEq/L (ref 3.5–5.1)
pCO2 arterial: 48.1 mmHg — ABNORMAL HIGH (ref 35.0–45.0)
pO2, Arterial: 25 mmHg — CL (ref 80.0–100.0)

## 2011-05-31 LAB — GLUCOSE, CAPILLARY
Glucose-Capillary: 194 mg/dL — ABNORMAL HIGH (ref 70–99)
Glucose-Capillary: 208 mg/dL — ABNORMAL HIGH (ref 70–99)
Glucose-Capillary: 256 mg/dL — ABNORMAL HIGH (ref 70–99)
Glucose-Capillary: 267 mg/dL — ABNORMAL HIGH (ref 70–99)

## 2011-05-31 LAB — RENAL FUNCTION PANEL
Albumin: 1.9 g/dL — ABNORMAL LOW (ref 3.5–5.2)
Chloride: 108 mEq/L (ref 96–112)
Creatinine, Ser: 1.06 mg/dL (ref 0.50–1.35)
GFR calc non Af Amer: 69 mL/min — ABNORMAL LOW (ref >90)
Potassium: 3.7 mEq/L (ref 3.5–5.1)

## 2011-05-31 MED ORDER — SODIUM CHLORIDE 0.9 % IV SOLN
0.4000 ug/kg/h | INTRAVENOUS | Status: DC
  Administered 2011-05-31: 1 ug/kg/h via INTRAVENOUS
  Administered 2011-05-31: 1.2 ug/kg/h via INTRAVENOUS
  Administered 2011-05-31 – 2011-06-01 (×4): 1 ug/kg/h via INTRAVENOUS
  Administered 2011-06-01: 1.2 ug/kg/h via INTRAVENOUS
  Administered 2011-06-01: 1.1 ug/kg/h via INTRAVENOUS
  Administered 2011-06-01: 1.2 ug/kg/h via INTRAVENOUS
  Administered 2011-06-02: 1.1 ug/kg/h via INTRAVENOUS
  Administered 2011-06-02: 1 ug/kg/h via INTRAVENOUS
  Filled 2011-05-31 (×13): qty 4

## 2011-05-31 MED ORDER — FERUMOXYTOL INJECTION 510 MG/17 ML
510.0000 mg | Freq: Once | INTRAVENOUS | Status: AC
  Administered 2011-05-31: 510 mg via INTRAVENOUS
  Filled 2011-05-31: qty 17

## 2011-05-31 MED ORDER — NEPRO/CARBSTEADY PO LIQD
1000.0000 mL | ORAL | Status: DC
  Administered 2011-05-31: 1000 mL
  Filled 2011-05-31 (×5): qty 1000

## 2011-05-31 MED ORDER — PREDNISONE 5 MG/5ML PO SOLN
10.0000 mg | Freq: Every day | ORAL | Status: DC
  Administered 2011-06-01: 10 mg
  Filled 2011-05-31 (×4): qty 10

## 2011-05-31 MED ORDER — PREDNISONE 5 MG/5ML PO SOLN: 10.0000 mg | Freq: Every day | ORAL | Status: DC

## 2011-05-31 NOTE — Progress Notes (Addendum)
Results for STEFAN, MARKARIAN (MRN [id number]) as of 05/31/2011 12:35  Ref. Range 05/31/2011 00:14 05/31/2011 03:31 05/31/2011 07:33  Glucose-Capillary Latest Range: 70-99 mg/dL 782 (H) 956 (H) 213 (H)    Patient was wearing an insulin pump prior to admission.  His Endocrinologist is Dr. Leslie Dales.  Insulin pump was removed 05/21/11.  Based on patient's insulin pump settings, recommend we increase patient's Lantus dose and add tube feed coverage to his current hospital regimen..  Recommend the following:  1. Increase Lantus to 30 units daily AM 2. Add tube coverage- Novolog 3 units Q4 hours (hold if tube feeding held for any reason)  Will follow. Ambrose Finland RN, MSN, CDE Diabetes Coordinator Inpatient Diabetes Program [phone]

## 2011-05-31 NOTE — Progress Notes (Signed)
Clinical Social Worker met with pt's wife at bedside; pt currently unable to participate in assessment.  CSW provided extensive emotional support. Wife is optimistic for pt's improvement and states she understands that pt may need trach (per conversation with MD).  CSW to continue to follow and assist as needed.   Angelia Mould, MSW, [place] [phone]

## 2011-05-31 NOTE — Progress Notes (Signed)
[facility]  Subjective:  Intubated, sedatives on hold to see if he can be weaned.  Wife at bedside   Objective: Vital signs in last 24 hours: Blood pressure 106/38, pulse 68, temperature 100.3 F (37.9 C), temperature source Oral, resp. rate 17, height 6\' 2"  (1.88 m), weight 100 kg (220 lb 7.4 oz), SpO2 100.00%.    PHYSICAL EXAM General--intubated, sedated Chest--rhonchi Heart--no rub Abd--allograft in RLQ Extr--no edema  Lab Results:   Lab 05/31/11 0436 05/30/11 0420 05/29/11 0430 05/28/11 0530  NA 145 153* 153* --  K 3.7 3.6 3.8 --  CL 108 113* 113* --  CO2 31 34* 34* --  BUN 38* 42* 52* --  CREATININE 1.06 1.14 1.26 --  ALB -- -- -- --  GLUCOSE 256* -- -- --  CALCIUM 9.6 9.7 9.8 --  PHOS 2.4 -- 2.6 2.4     Basename 05/31/11 0436 05/30/11 0420  WBC 11.2* 11.8*  HGB 8.5* 8.0*  HCT 28.1* 27.4*  PLT 214 222    Weight has increased 4-5 kg in last 7 days:  Assessment/Plan:  1. S/P renal transplant--renal fct ok on cell cept, prograf, and steroids.  prograf level pending 2. Anemia--HGB 8, Fe/TIBC 12 % sat, ferritin 649--will Rx IV Fe 3. VDRF--CXR show large R pl eff (tapped in past), bibasilar atelectasis/infiltr, plus less pulm vasc. Congestion. CVP 10. No diuretics at this point. Remains on zosyn, vanco, and micafungin. Dr. Franklyn Lor try weaning/extub  4. Hypernatremia--I>O, receiving hypotonic fluids  5. DM--off pump, on SSI + insulin drip  6. 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 S/P laminectomy 26 Mar--per neurosurg.  8. Fever (T100.9 at 3 AM 9 Apr). Continue AB. Does he need thoracentesis   LOS: 15 days   John [name] F 05/31/2011,10:14 AM   .labalb

## 2011-05-31 NOTE — Progress Notes (Signed)
PT did not tolerate wean. RT increased PS to 16 and pt did not tolerate.  RT will attempt again later today.

## 2011-05-31 NOTE — Progress Notes (Signed)
Patient ID: John [name], male   DOB: 04-Aug-1940, 71 y.o.   MRN: [id number] Continue in the respirator. Spoke with wife. Question were answered

## 2011-05-31 NOTE — Progress Notes (Signed)
Subjective: obtunded   Antibiotics:  Anti-infectives     Start     Dose/Rate Route Frequency Ordered Stop   05/29/11 1800   vancomycin (VANCOCIN) 750 mg in sodium chloride 0.9 % 150 mL IVPB        750 mg 150 mL/hr over 60 Minutes Intravenous Every 12 hours 05/28/11 2304     05/28/11 1015   micafungin (MYCAMINE) 100 mg in sodium chloride 0.9 % 100 mL IVPB     Comments: Please ask pharmacy to assist with dosing.      100 mg 100 mL/hr over 1 Hours Intravenous Daily 05/28/11 1014     05/24/11 2200   vancomycin (VANCOCIN) 1,250 mg in sodium chloride 0.9 % 250 mL IVPB  Status:  Discontinued        1,250 mg 166.7 mL/hr over 90 Minutes Intravenous Every 24 hours 05/24/11 1922 05/28/11 2304   05/24/11 1800   vancomycin (VANCOCIN) 1,250 mg in sodium chloride 0.9 % 250 mL IVPB  Status:  Discontinued        1,250 mg 166.7 mL/hr over 90 Minutes Intravenous Every 24 hours 05/24/11 0141 05/24/11 0940   05/21/11 1000  piperacillin-tazobactam (ZOSYN) IVPB 3.375 g       3.375 g 12.5 mL/hr over 240 Minutes Intravenous Every 8 hours 05/21/11 0855     05/21/11 1000   vancomycin (VANCOCIN) IVPB 1000 mg/200 mL premix  Status:  Discontinued        1,000 mg 200 mL/hr over 60 Minutes Intravenous Every 12 hours 05/21/11 0855 05/23/11 1101   05/16/11 0932   bacitracin 50,000 Units in sodium chloride irrigation 0.9 % 500 mL irrigation  Status:  Discontinued          As needed 05/16/11 0932 05/16/11 1055   05/16/11 0700   bacitracin 16109 UNITS injection     Comments: Imagene Gurney: cabinet override         05/16/11 0700 05/16/11 1914   05/16/11 0000   ceFAZolin (ANCEF) IVPB 2 g/50 mL premix        2 g 100 mL/hr over 30 Minutes Intravenous 60 min pre-op 05/15/11 1449 05/16/11 0828          Medications: Scheduled Meds:    . albuterol  2.5 mg Nebulization Q6H  . antiseptic oral rinse  1 application Mouth Rinse QID  . aspirin  81 mg Per Tube Daily  . calcium carbonate  2 tablet Per Tube Daily    . carbidopa-levodopa  1 tablet Per Tube TID  . chlorhexidine  15 mL Mouth/Throat BID  . ferumoxytol  510 mg Intravenous Once  . free water  250 mL Per Tube Q4H  . insulin aspart  0-20 Units Subcutaneous Q4H  . insulin glargine  20 Units Subcutaneous Daily  . ipratropium  0.5 mg Nebulization Q6H  . lamoTRIgine  200 mg Oral Daily  . micafungin [facility]) IV  100 mg Intravenous Daily  . mycophenolate  500 mg Oral BID  . pantoprazole sodium  40 mg Per Tube Q1200  . piperacillin-tazobactam (ZOSYN)  IV  3.375 g Intravenous Q8H  . predniSONE  10 mg Per Tube Q breakfast  . pyridostigmine  30 mg Oral TID  . QUEtiapine  100 mg Oral QHS  . tacrolimus  5 mg Oral BID  . vancomycin  750 mg Intravenous Q12H  . DISCONTD: hydrocortisone sod succinate (SOLU-CORTEF) injection  20 mg Intravenous Daily  . DISCONTD: predniSONE  10 mg Per Tube Q breakfast  Continuous Infusions:    . sodium chloride 10 mL/hr at 05/31/11 0511  . sodium chloride 10 mL/hr at 05/31/11 0511  . sodium chloride 10 mL/hr at 05/30/11 0700  . dexmedetomidine (PRECEDEX) IV infusion 1 mcg/kg/hr (05/31/11 0740)  . dexmedetomidine (PRECEDEX) IV infusion for high rates 1 mcg/kg/hr (05/31/11 1830)  . feeding supplement (NEPRO CARB STEADY) 1,000 mL (05/31/11 1500)  . fentaNYL infusion INTRAVENOUS 50 mcg/hr (05/31/11 1006)  . DISCONTD: feeding supplement (NEPRO CARB STEADY) 1,000 mL (05/30/11 0846)   PRN Meds:.acetaminophen (TYLENOL) oral liquid 160 mg/5 mL, albuterol, hydrALAZINE, hydrocortisone cream, midazolam, ondansetron (ZOFRAN) IV, DISCONTD: acetaminophen   Objective: Weight change: 5 lb 4.7 oz (2.4 kg)  Intake/Output Summary (Last 24 hours) at 05/31/11 1941 Last data filed at 05/31/11 1900  Gross per 24 hour  Intake 4025.64 ml  Output   1575 ml  Net 2450.64 ml   Blood pressure 146/50, pulse 59, temperature 100.9 F (38.3 C), temperature source Oral, resp. rate 15, height 6\' 2"  (1.88 m), weight 220 lb 7.4 oz (100 kg),  SpO2 100.00%. Temp:  [98 F (36.7 C)-100.9 F (38.3 C)] 100.9 F (38.3 C) (04/10 1602) Pulse Rate:  [59-69] 59  (04/10 1900) Resp:  [0-23] 15  (04/10 1900) BP: (106-149)/(38-58) 146/50 mmHg (04/10 1900) SpO2:  [95 %-100 %] 100 % (04/10 1900) FiO2 (%):  [40 %] 40 % (04/10 1524) Weight:  [220 lb 7.4 oz (100 kg)] 220 lb 7.4 oz (100 kg) (04/10 0400)  Physical Exam: General:  awake, trying to get at things with hands which are in mits HEENT: anicteric sclera, pupils reactive to light and accommodation, EOMI CVS regular rate, normal r,  no murmur rubs or gallops Chest: fairly  clear to auscultation bilaterally,Abdomen: soft ? Tender? , nondistended, normal bowel sounds, Extremities: no  clubbing or edema noted bilaterally Skin: no rashes, surgical site is clean Neuro: delirious but not focal deficitis  Lab Results:  Basename 05/31/11 0436 05/30/11 0420  WBC 11.2* 11.8*  HGB 8.5* 8.0*  HCT 28.1* 27.4*  PLT 214 222    BMET  Basename 05/31/11 0436 05/30/11 0420  NA 145 153*  K 3.7 3.6  CL 108 113*  CO2 31 34*  GLUCOSE 256* 143*  BUN 38* 42*  CREATININE 1.06 1.14  CALCIUM 9.6 9.7    Micro Results: Recent Results (from the past 240 hour(s))  CULTURE, BLOOD (ROUTINE X 2)     Status: Normal   Collection Time   05/24/11 12:40 PM      Component Value Range Status Comment   Specimen Description BLOOD LEFT ARM   Final    Special Requests BOTTLES DRAWN AEROBIC AND ANAEROBIC 10CC   Final    Culture  Setup Time [id number]   Final    Culture NO GROWTH 5 DAYS   Final    Report Status 05/30/2011 FINAL   Final   CULTURE, BLOOD (ROUTINE X 2)     Status: Normal   Collection Time   05/24/11 12:40 PM      Component Value Range Status Comment   Specimen Description BLOOD RIGHT WRIST   Final    Special Requests BOTTLES DRAWN AEROBIC AND ANAEROBIC 10CC   Final    Culture  Setup Time [id number]   Final    Culture NO GROWTH 5 DAYS   Final    Report Status 05/30/2011 FINAL   Final     CULTURE, RESPIRATORY     Status: Normal   Collection Time  05/24/11  3:01 PM      Component Value Range Status Comment   Specimen Description TRACHEAL ASPIRATE   Final    Special Requests NONE   Final    Gram Stain     Final    Value: FEW WBC PRESENT,BOTH PMN AND MONONUCLEAR     NO SQUAMOUS EPITHELIAL CELLS SEEN     NO ORGANISMS SEEN   Culture RARE CANDIDA ALBICANS   Final    Report Status 05/26/2011 FINAL   Final   CULTURE, BLOOD (ROUTINE X 2)     Status: Normal (Preliminary result)   Collection Time   05/26/11  5:50 PM      Component Value Range Status Comment   Specimen Description BLOOD LEFT HAND   Final    Special Requests BOTTLES DRAWN AEROBIC ONLY 3CC   Final    Culture  Setup Time [id number]   Final    Culture     Final    Value:        BLOOD CULTURE RECEIVED NO GROWTH TO DATE CULTURE WILL BE HELD FOR 5 DAYS BEFORE ISSUING A FINAL NEGATIVE REPORT   Report Status PENDING   Incomplete   CULTURE, BLOOD (ROUTINE X 2)     Status: Normal (Preliminary result)   Collection Time   05/26/11  6:00 PM      Component Value Range Status Comment   Specimen Description BLOOD LEFT WRIST   Final    Special Requests     Final    Value: BOTTLES DRAWN AEROBIC AND ANAEROBIC 7CC AER 4CC ANA   Culture  Setup Time [id number]   Final    Culture     Final    Value:        BLOOD CULTURE RECEIVED NO GROWTH TO DATE CULTURE WILL BE HELD FOR 5 DAYS BEFORE ISSUING A FINAL NEGATIVE REPORT   Report Status PENDING   Incomplete     Studies/Results: Ct Abdomen Pelvis Wo Contrast  05/30/2011  *RADIOLOGY REPORT*  Clinical Data: Fever of unknown origin, renal transplant  CT ABDOMEN AND PELVIS WITHOUT CONTRAST  Technique:  Multidetector CT imaging of the abdomen and pelvis was performed following the standard protocol without intravenous contrast.  Comparison: CT chest dated 05/27/2011.  CT abdomen pelvis dated 01/12/2010.  Findings: Moderate right and small left pleural effusions, unchanged.  Associated  bilateral lower lobe opacities, right greater than left, likely atelectasis.  Mild patchy atelectasis / scarring in the lingula.  Chronic right hilar/lower lobe calcifications, likely sequela of prior granulomatous disease.  Enteric tube terminates in the first portion of the duodenum.  Unenhanced liver is notable for a calcified granuloma in the posterior right hepatic dome (series 2/image 24).  Splenomegaly, measuring 16.5 cm in maximal dimension, mildly increased.  Multiple splenic granulomata.  Prostate and adrenal glands are within normal limits.  Gallbladder is unremarkable.  No intrahepatic or extrahepatic ductal dilatation.  Native renal atrophy.  1.9 cm left upper pole cyst (series 2/image 43).  Unenhanced right lower quadrant renal transplant is grossly unremarkable, without hydronephrosis.  No evidence of bowel obstruction.  Normal appendix.  Moderate stool in the distal colon/rectum.  Atherosclerotic calcifications of the abdominal aorta and branch vessels.  No abdominopelvic ascites.  No suspicious abdominopelvic lymphadenopathy.  Brachytherapy seeds in the prostate.  Bladder is decompressed with indwelling Foley catheter and nondependent gas.  Mild surrounding perivesical stranding (series 2/image 92).  Small fat-containing right inguinal hernia.  Degenerative changes of the visualized thoracolumbar spine.  IMPRESSION: Moderate right and small left pleural effusions, unchanged. Associated bilateral lower lobe opacities, right greater than left, likely atelectasis.  Normal appendix.  No evidence of bowel obstruction.  Moderate stool in the distal colon/rectum.  Unenhanced right lower quadrant renal transplant is grossly unremarkable.  No hydronephrosis.  Bladder is decompressed with indwelling Foley catheter.  Mild surrounding perivesical stranding, correlate for cystitis.  Original Report Authenticated By: Charline Bills, M.D.   Dg Chest Port 1 View  05/31/2011  *RADIOLOGY REPORT*  Clinical Data:  Ventilator.  PORTABLE CHEST - 1 VIEW  Comparison: Portable chest 05/30/2011 peri  Findings: The heart is enlarged.  The patient remains intubated. Endotracheal tube is in this table position.  A left IJ line is stable.  The right pleural effusion is stable.  Interstitial airspace disease has increased, right greater than left.  IMPRESSION:  1.  Increased interstitial airspace disease.  While this could represent edema, infection is not excluded. 2.  Stable cardiomegaly and pleural effusion. 3.  The support apparatus is stable.  Original Report Authenticated By: Jamesetta Orleans. MATTERN, M.D.   Dg Chest Port 1 View  05/30/2011  *RADIOLOGY REPORT*  Clinical Data: Ventilator.  PORTABLE CHEST - 1 VIEW  Comparison: 05/29/2011  Findings: Endotracheal tube, left central line and NG tube remain in place, unchanged.  Heart is borderline in size.  Prior CABG. Diffuse interstitial edema likely present, unchanged.  Small bilateral pleural effusions are unchanged.  IMPRESSION: Continued mild interstitial edema.  Small effusions.  Original Report Authenticated By: Cyndie Chime, M.D.      Assessment/Plan: John [name] is a 71 y.o. male with COPD, renal transplant who underwent laminectomy on 26th of March complicated by respiratory failure, intubation, who has been febrile and on broad spectrum antibiotics including vanco/zosyn >8 days and now mycafungin. Urine cx only small amt of morganella. CT chest abdomen pelvis and L spine all unremarkable as is CT abdomen and pelvis   1) FUO:  Fever curve favorable on broad spectrum abx but no clear cut source at all  --will dc ALL antibiotics and observe       LOS: 15 days   Acey Lav 05/31/2011, 7:41 PM

## 2011-05-31 NOTE — Progress Notes (Signed)
HPI: 71 yo male with hx COPD, renal transplant (2002), DM on insulin pump who presented 3/26 for lumbar laminotomies and decompression L5 and S1 r/t spondylosis and herniated nucleus pulposus. Post op developed episodes of confusion and respiratory distress thought r/t CHF Which initially improved with lasix. On 3/31 developed worsening respiratory distress and hypoxia and PCCM consulted.   Antibiotics: (per ID) Vanc 3/31>>>  Zosyn 3/31>>>  4/7 micofungin>>>  Cultures/Sepsis Markers:   Urine 3/29>>> MORGANELLA MORGANII (6,000 COLONIES/ML)  BCx2 3/28>>> negative  Sputum 3/31>>>neg  Resp 4/3 >> rare candida Blood 4/3 >> NEG Blood 4/5 >> NEG   Access/Protocols:  L IJ CVL 3/31 >>  ET tube 4/3 >>   Best Practice:  DVT: SCDs GI: Pantoprazole   STUDIES: CT CHEST 05/27/11  2. Small left pleural effusion. Moderate sized right pleural effusion. Bilateral lower lobe posterior atelectasis or infiltrate.  3. Mild emphysematous changes bilateral upper lobe. No convincing pulmonary edema.  4. No mediastinal hematoma or adenopathy. Calcified right hilar lymph node.   Subjective: RASS -3, CAM- ICU positive. Failed SBT and attempts @ weaning in PSV mode  Physical Exam: Filed Vitals:   05/31/11 1602  BP:   Pulse:   Temp: 100.9 F (38.3 C)  Resp:     Intake/Output Summary (Last 24 hours) at 05/31/11 1629 Last data filed at 05/31/11 1600  Gross per 24 hour  Intake 3756.64 ml  Output   1760 ml  Net 1996.64 ml   Vent Mode:  [-] PRVC FiO2 (%):  [40 %] 40 % Set Rate:  [12 bmp] 12 bmp Vt Set:  [580 mL] 580 mL PEEP:  [5 cmH20] 5 cmH20 Plateau Pressure:  [22 cmH20-30 cmH20] 28 cmH20  General: chronically ill appearing male, AOx1. Neuro: sedated on vent  CV: RRR s M.  PULM: resps even, mildly labored, diminished throughout with few scattered ronchi.  GI: abd soft, +bs.  Extremities: Warm and dry, trace BLE edema.   Labs: CBC:    Component Value Date/Time   WBC 11.2* 05/31/2011  0436   HGB 8.5* 05/31/2011 0436   HCT 28.1* 05/31/2011 0436   PLT 214 05/31/2011 0436   MCV 71.1* 05/31/2011 0436   NEUTROABS 9.1* 05/24/2011 0250   LYMPHSABS 1.7 05/24/2011 0250   MONOABS 1.3* 05/24/2011 0250   EOSABS 0.5 05/24/2011 0250   BASOSABS 0.0 05/24/2011 0250     Comprehensive Metabolic Panel:    Component Value Date/Time   NA 145 05/31/2011 0436   K 3.7 05/31/2011 0436   CL 108 05/31/2011 0436   CO2 31 05/31/2011 0436   BUN 38* 05/31/2011 0436   CREATININE 1.06 05/31/2011 0436   GLUCOSE 256* 05/31/2011 0436   CALCIUM 9.6 05/31/2011 0436   AST 16 05/20/2011 0836   ALT 6 05/20/2011 0836   ALKPHOS 121* 05/20/2011 0836   BILITOT 0.8 05/20/2011 0836   PROT 6.3 05/20/2011 0836   ALBUMIN 1.9* 05/31/2011 0436   ABG   Lab 05/31/11 0436  MG 2.2   Lab Results  Component Value Date   CALCIUM 9.6 05/31/2011   PHOS 2.4 05/31/2011    Assessment & Plan:  Acute respiratory failure  Initially due to excessive analgesic therapy and depressed ventilatory drive in setting of COPD and chronic small pleural effusions. Now sedation is a major problem. Changed to Precedex and fentanyl gtts 4/9. Too sedated today to progress with weaning. Will decrease fentanyl dose. Will likely need trach. Wife is hopeful but realistic  DM  Transition to  Lantus and SSI 4/10  Chronic renal insufficiency s/p renal transplant 2002  Renal following.  - Cont cellcept, prograf.  - Hold diuresis as above. - continue free water   Fever/Leukocytosis  - Per ID, now on micofungin/vanco/zosyn  Best practices / Disposition  -->ICU status under PCCM  -->full code >>>>will discuss EOL with spouse -->SCD's for DVT Px  -->Protonix for GI Px    Wife updated in detail @ bedside. 30 mins CCM time  Billy Fischer, MD;  PCCM service; Mobile [phone]

## 2011-06-01 ENCOUNTER — Inpatient Hospital Stay (HOSPITAL_COMMUNITY): Payer: Medicare Other

## 2011-06-01 DIAGNOSIS — Z9911 Dependence on respirator [ventilator] status: Secondary | ICD-10-CM

## 2011-06-01 DIAGNOSIS — J90 Pleural effusion, not elsewhere classified: Secondary | ICD-10-CM

## 2011-06-01 LAB — PHOSPHORUS: Phosphorus: 2.7 mg/dL (ref 2.3–4.6)

## 2011-06-01 LAB — PH, BODY FLUID: pH, Fluid: 8.5

## 2011-06-01 LAB — GLUCOSE, CAPILLARY
Glucose-Capillary: 184 mg/dL — ABNORMAL HIGH (ref 70–99)
Glucose-Capillary: 230 mg/dL — ABNORMAL HIGH (ref 70–99)
Glucose-Capillary: 247 mg/dL — ABNORMAL HIGH (ref 70–99)

## 2011-06-01 LAB — COMPREHENSIVE METABOLIC PANEL
Alkaline Phosphatase: 92 U/L (ref 39–117)
BUN: 30 mg/dL — ABNORMAL HIGH (ref 6–23)
Calcium: 9.3 mg/dL (ref 8.4–10.5)
Creatinine, Ser: 0.9 mg/dL (ref 0.50–1.35)
GFR calc Af Amer: >90 mL/min (ref >90)
Glucose, Bld: 217 mg/dL — ABNORMAL HIGH (ref 70–99)
Potassium: 3.7 mEq/L (ref 3.5–5.1)
Total Protein: 5.6 g/dL — ABNORMAL LOW (ref 6.0–8.3)

## 2011-06-01 LAB — BODY FLUID CELL COUNT WITH DIFFERENTIAL: Total Nucleated Cell Count, Fluid: 7 cu mm (ref 0–1000)

## 2011-06-01 LAB — CBC
Hemoglobin: 7.9 g/dL — ABNORMAL LOW (ref 13.0–17.0)
MCHC: 30 g/dL (ref 30.0–36.0)

## 2011-06-01 LAB — PROTEIN, BODY FLUID: Total protein, fluid: 3.6 g/dL

## 2011-06-01 LAB — LACTATE DEHYDROGENASE, PLEURAL OR PERITONEAL FLUID: LD, Fluid: 126 U/L — ABNORMAL HIGH (ref 3–23)

## 2011-06-01 MED ORDER — VILAZODONE HCL 20 MG PO TABS
20.0000 mg | ORAL_TABLET | Freq: Every day | ORAL | Status: DC
  Administered 2011-06-03 – 2011-06-08 (×6): 20 mg via ORAL
  Filled 2011-06-01: qty 1

## 2011-06-01 MED ORDER — SODIUM CHLORIDE 0.9 % IJ SOLN: 10.0000 mL | INTRAMUSCULAR | Status: DC | PRN

## 2011-06-01 MED ORDER — ETOMIDATE 2 MG/ML IV SOLN
40.0000 mg | Freq: Once | INTRAVENOUS | Status: DC
  Filled 2011-06-01 (×2): qty 20

## 2011-06-01 MED ORDER — SODIUM CHLORIDE 0.9 % IJ SOLN
10.0000 mL | Freq: Two times a day (BID) | INTRAMUSCULAR | Status: DC
  Administered 2011-06-01 – 2011-06-02 (×2): 20 mL
  Administered 2011-06-02 – 2011-06-03 (×2): 10 mL
  Administered 2011-06-03: 20 mL
  Administered 2011-06-04 (×2): 10 mL
  Administered 2011-06-05: 20 mL
  Administered 2011-06-05: 10 mL
  Administered 2011-06-06: 20 mL
  Administered 2011-06-06: 30 mL
  Administered 2011-06-07 (×2): 20 mL

## 2011-06-01 MED ORDER — MIDAZOLAM HCL 2 MG/2ML IJ SOLN: 5.0000 mg | Freq: Once | INTRAMUSCULAR | Status: DC

## 2011-06-01 MED ORDER — VECURONIUM BROMIDE 10 MG IV SOLR
10.0000 mg | Freq: Once | INTRAVENOUS | Status: DC
  Filled 2011-06-01 (×2): qty 10

## 2011-06-01 MED ORDER — FUROSEMIDE 10 MG/ML IJ SOLN
40.0000 mg | Freq: Two times a day (BID) | INTRAMUSCULAR | Status: DC
  Administered 2011-06-01 (×2): 40 mg via INTRAVENOUS
  Filled 2011-06-01 (×4): qty 4

## 2011-06-01 MED ORDER — SODIUM CHLORIDE 0.9 % IV SOLN
25.0000 ug/h | INTRAVENOUS | Status: DC
  Administered 2011-06-01: 200 ug/h via INTRAVENOUS
  Administered 2011-06-01: 50 ug/h via INTRAVENOUS
  Filled 2011-06-01 (×2): qty 50

## 2011-06-01 MED ORDER — FENTANYL CITRATE 0.05 MG/ML IJ SOLN: 200.0000 ug | Freq: Once | INTRAMUSCULAR | Status: DC

## 2011-06-01 MED ORDER — FENTANYL 50 MCG/HR TD PT72
100.0000 ug | MEDICATED_PATCH | TRANSDERMAL | Status: DC
  Administered 2011-06-01: 100 ug via TRANSDERMAL
  Filled 2011-06-01: qty 2

## 2011-06-01 NOTE — Progress Notes (Signed)
Name: John [name] MRN: [id number] DOB: [birth year]  ELECTRONIC ICU PHYSICIAN NOTE  Problem:  Agitation on precedex drip maxed   Intervention:  Increase fentanyl drip to max of 200 mcg/hr  Sandrea Hughs 06/01/2011, 6:03 AM

## 2011-06-01 NOTE — Progress Notes (Addendum)
HPI: 71 yo male with hx COPD, renal transplant (2002), DM on insulin pump who presented 3/26 for lumbar laminotomies and decompression L5 and S1 r/t spondylosis and herniated nucleus pulposus. Post op developed episodes of confusion and respiratory distress thought r/t CHF Which initially improved with lasix. On 3/31 developed worsening respiratory distress and hypoxia and PCCM consulted.   Antibiotics: (per ID) Vanc 3/31>>>4/10 Zosyn 3/31>>> 4/10 4/7 micofungin>>>4/10  Cultures/Sepsis Markers:   Urine 3/29>>> MORGANELLA MORGANII (6,000 COLONIES/ML)  BCx2 3/28>>> negative  Sputum 3/31>>>neg  Resp 4/3 >> rare candida Blood 4/3 >> NEG Blood 4/5 >> NEG   Access/Protocols:  L IJ CVL 3/31 >>  ET tube 4/3 >>   Best Practice:  DVT: SCDs GI: Pantoprazole   STUDIES: CT CHEST 05/27/11  2. Small left pleural effusion. Moderate sized right pleural effusion. Bilateral lower lobe posterior atelectasis or infiltrate.  3. Mild emphysematous changes bilateral upper lobe. No convincing pulmonary edema.  4. No mediastinal hematoma or adenopathy. Calcified right hilar lymph node.   Subjective: RASS -1  , CAM- ICU positive. Failed SBT and attempts @ weaning in PSV mode again, agitated last night on max precedex dose   Physical Exam: Filed Vitals:   06/01/11 0805  BP: 147/56  Pulse: 61  Temp:   Resp: 14    Intake/Output Summary (Last 24 hours) at 06/01/11 0840 Last data filed at 06/01/11 0800  Gross per 24 hour  Intake 3811.17 ml  Output   1627 ml  Net 2184.17 ml   Vent Mode:  [-] PRVC FiO2 (%):  [40 %] 40 % Set Rate:  [12 bmp] 12 bmp Vt Set:  [580 mL] 580 mL PEEP:  [5 cmH20] 5 cmH20 Plateau Pressure:  [23 cmH20-30 cmH20] 25 cmH20  General: chronically ill appearing male, AOx1. Neuro: sedated on vent , will not follow commands except tries to open eyes CV: RRR s M.  PULM: resps even, mildly labored, diminished throughout with few scattered ronchi.  GI: abd soft, +bs.    Extremities: Warm and dry, trace BLE edema.   Labs: CBC:    Component Value Date/Time   WBC 10.2 06/01/2011 0330   HGB 7.9* 06/01/2011 0330   HCT 26.3* 06/01/2011 0330   PLT 210 06/01/2011 0330   MCV 70.7* 06/01/2011 0330   NEUTROABS 9.1* 05/24/2011 0250   LYMPHSABS 1.7 05/24/2011 0250   MONOABS 1.3* 05/24/2011 0250   EOSABS 0.5 05/24/2011 0250   BASOSABS 0.0 05/24/2011 0250     Comprehensive Metabolic Panel:    Component Value Date/Time   NA 145 06/01/2011 0330   K 3.7 06/01/2011 0330   CL 111 06/01/2011 0330   CO2 29 06/01/2011 0330   BUN 30* 06/01/2011 0330   CREATININE 0.90 06/01/2011 0330   GLUCOSE 217* 06/01/2011 0330   CALCIUM 9.3 06/01/2011 0330   AST 25 06/01/2011 0330   ALT 10 06/01/2011 0330   ALKPHOS 92 06/01/2011 0330   BILITOT 0.6 06/01/2011 0330   PROT 5.6* 06/01/2011 0330   ALBUMIN 1.8* 06/01/2011 0330   ABG   Lab 05/31/11 0436  MG 2.2   Lab Results  Component Value Date   CALCIUM 9.3 06/01/2011   PHOS 2.7 06/01/2011    Assessment & Plan:  Acute respiratory failure  Initially due to excessive analgesic therapy and depressed ventilatory drive in setting of COPD and chronic small pleural effusions. Now sedation is a major problem. Will need trach.  Precedex with marginal effectiveness  DM  Cont Lantus and SSI  4/10  Chronic renal insufficiency s/p renal transplant 2002  Renal following.  - Cont cellcept, prograf.  --I>>>O pos  Will resume lasix - continue free water   Fever/Leukocytosis  - Per ID,Now off all ABX per ID -CVL in place for 7days, will change to PICC Line.  Encephalopathy  ?etiol  Hx of Bipolar on multiple neurogenic meds He does follow commands and states he is in pain.  Plan Cont precedex/fentanyl, place fentanyl patch Get Trach Neuro consult  Depression Resume antidepressant med  Chronic bilateral pleural effusions R>L   Try to tap and culture R effusion and leave pigtail in place   Best practices / Disposition  -->ICU status under  PCCM  -->full code >>>>will discuss EOL with spouse -->SCD's for DVT Px  -->Protonix for GI Px    Wife updated in detail @ bedside. 30 mins CCM time  Shan Levans Beeper  [phone]  Cell  [phone]  If no response or cell goes to voicemail, call beeper [phone]

## 2011-06-01 NOTE — Progress Notes (Addendum)
[facility]  Subjective:  On less sedation.  Opens eyes, but doesn't follow commands.  Wife at bedside.  For thoracentesis today, trach tomorrow   Objective: Vital signs in last 24 hours: Blood pressure 153/40, pulse 65, temperature 98.5 F (36.9 C), temperature source Oral, resp. rate 19, height 6\' 2"  (1.88 m), weight 102.4 kg (225 lb 12 oz), SpO2 100.00%.    PHYSICAL EXAM General--as above Chest--rhonchi Heart--syst murmur, no rub Abd--nontender, allograft in RLQ Extr--trace edema  Lab Results:   Lab 06/01/11 0330 05/31/11 0436 05/30/11 0420 05/29/11 0430  NA 145 145 153* --  K 3.7 3.7 3.6 --  CL 111 108 113* --  CO2 29 31 34* --  BUN 30* 38* 42* --  CREATININE 0.90 1.06 1.14 --  ALB -- -- -- --  GLUCOSE 217* -- -- --  CALCIUM 9.3 9.6 9.7 --  PHOS 2.7 2.4 -- 2.6     Basename 06/01/11 0330 05/31/11 0436  WBC 10.2 11.2*  HGB 7.9* 8.5*  HCT 26.3* 28.1*  PLT 210 214   Assessment/Plan: 1. S/P renal transplant--renal fct ok on cell cept, prograf, and steroids. prograf level 5.5 on 8 Apr (good)  2. Anemia--HGB 8, Fe/TIBC 12 % sat, ferritin 649--on IV Fe.  Type and screen in AM  3. VDRF--CXR show large R pl eff (tapped in past), bibasilar atelectasis/infiltr, plus less pulm vasc. Congestion. CVP pending today. No diuretics at this point. CXR showing some more vascular congestion.  Weight has increased from 94.7 kg on 5 Apr to 102.4 kg today.  Wt was 100.5 kg in my office Jan 2013.  I suspect he's fluid overloaded.  Dr. Delford Field has begun furosemide 40 BID 4. Hypernatremia--I>O, receiving hypotonic fluids  5. DM--off pump, on SSI + insulin drip  6. 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 S/P laminectomy 26 Mar--per neurosurg.  8. Fever (T100.9 at 3 AM 9 Apr). Off AB.  Thoracentesis today    LOS: 16 days   John [name] F 06/01/2011,9:54 AM   .labalb

## 2011-06-01 NOTE — Progress Notes (Signed)
Pt has temp of 100.6 and is hypertensive; PRN tylenol given for temp and IV hydralazine given for hypertension; will continue to monitor closely and update as needed

## 2011-06-01 NOTE — Progress Notes (Signed)
Patient ID: John [name], male   DOB: 07/28/1940, 71 y.o.   MRN: [id number] No changes. Dr Delford Field to proceed with trach

## 2011-06-01 NOTE — Progress Notes (Signed)
Alver Sorrow, RN and expressed concern regarding PICC placement due to pt. Having had a renal transplant.   Need clearance from renal MD.   She is going to get this clarified.

## 2011-06-01 NOTE — Progress Notes (Signed)
UR Completed.  Meggie Laseter Jane [phone] 06/01/2011  

## 2011-06-01 NOTE — Progress Notes (Signed)
Results for QUINTERIOUS, WALRAVEN (MRN [id number]) as of 06/01/2011 10:54  Ref. Range 05/31/2011 23:46 06/01/2011 03:02 06/01/2011 07:34  Glucose-Capillary Latest Range: 70-99 mg/dL 295 (H) 284 (H) 132 (H)     Patient was wearing an insulin pump prior to admission.  His Endocrinologist is Dr. Leslie Dales.  Insulin pump was removed 05/21/11.  Based on patient's insulin pump settings, recommend we increase patient's Lantus dose and add tube feed coverage to his current hospital regimen..  Recommend the following:  1. Increase Lantus to 30 units daily AM 2. Add tube coverage- Novolog 3 units Q4 hours (hold if tube feeding held for any reason)  Will follow. Ambrose Finland RN, MSN, CDE Diabetes Coordinator Inpatient Diabetes Program [phone]

## 2011-06-01 NOTE — Progress Notes (Signed)
Pt became very agitated; breathing over ventilator; waving arms in air; attempting to sit up; Elink cameraed in; orders received; will continue to monitor closely and update as needed

## 2011-06-01 NOTE — Procedures (Signed)
Portable Rt thoracentesis US guided  400 yellow and blood tinged fluid removed Sent for labs per MD  Port CxR pending  BP stable

## 2011-06-01 NOTE — Progress Notes (Signed)
Subjective: Apparently more responsive today. For tracheostomy and drainage of pleural effusion   Antibiotics:  Anti-infectives     Start     Dose/Rate Route Frequency Ordered Stop   05/29/11 1800   vancomycin (VANCOCIN) 750 mg in sodium chloride 0.9 % 150 mL IVPB  Status:  Discontinued        750 mg 150 mL/hr over 60 Minutes Intravenous Every 12 hours 05/28/11 2304 05/31/11 1943   05/28/11 1015   micafungin (MYCAMINE) 100 mg in sodium chloride 0.9 % 100 mL IVPB  Status:  Discontinued     Comments: Please ask pharmacy to assist with dosing.      100 mg 100 mL/hr over 1 Hours Intravenous Daily 05/28/11 1014 05/31/11 1943   05/24/11 2200   vancomycin (VANCOCIN) 1,250 mg in sodium chloride 0.9 % 250 mL IVPB  Status:  Discontinued        1,250 mg 166.7 mL/hr over 90 Minutes Intravenous Every 24 hours 05/24/11 1922 05/28/11 2304   05/24/11 1800   vancomycin (VANCOCIN) 1,250 mg in sodium chloride 0.9 % 250 mL IVPB  Status:  Discontinued        1,250 mg 166.7 mL/hr over 90 Minutes Intravenous Every 24 hours 05/24/11 0141 05/24/11 0940   05/21/11 1000   piperacillin-tazobactam (ZOSYN) IVPB 3.375 g  Status:  Discontinued        3.375 g 12.5 mL/hr over 240 Minutes Intravenous Every 8 hours 05/21/11 0855 05/31/11 1943   05/21/11 1000   vancomycin (VANCOCIN) IVPB 1000 mg/200 mL premix  Status:  Discontinued        1,000 mg 200 mL/hr over 60 Minutes Intravenous Every 12 hours 05/21/11 0855 05/23/11 1101   05/16/11 0932   bacitracin 50,000 Units in sodium chloride irrigation 0.9 % 500 mL irrigation  Status:  Discontinued          As needed 05/16/11 0932 05/16/11 1055   05/16/11 0700   bacitracin 16109 UNITS injection     Comments: Imagene Gurney: cabinet override         05/16/11 0700 05/16/11 1914   05/16/11 0000   ceFAZolin (ANCEF) IVPB 2 g/50 mL premix        2 g 100 mL/hr over 30 Minutes Intravenous 60 min pre-op 05/15/11 1449 05/16/11 0828          Medications: Scheduled  Meds:    . albuterol  2.5 mg Nebulization Q6H  . antiseptic oral rinse  1 application Mouth Rinse QID  . aspirin  81 mg Per Tube Daily  . calcium carbonate  2 tablet Per Tube Daily  . carbidopa-levodopa  1 tablet Per Tube TID  . chlorhexidine  15 mL Mouth/Throat BID  . etomidate  40 mg Intravenous Once  . fentaNYL  100 mcg Transdermal Q72H  . fentaNYL  200 mcg Intravenous Once  . free water  250 mL Per Tube Q4H  . furosemide  40 mg Intravenous Q12H  . insulin aspart  0-20 Units Subcutaneous Q4H  . insulin glargine  20 Units Subcutaneous Daily  . ipratropium  0.5 mg Nebulization Q6H  . lamoTRIgine  200 mg Oral Daily  . midazolam  5 mg Intravenous Once  . mycophenolate  500 mg Oral BID  . pantoprazole sodium  40 mg Per Tube Q1200  . predniSONE  10 mg Per Tube Q breakfast  . pyridostigmine  30 mg Oral TID  . QUEtiapine  100 mg Oral QHS  . tacrolimus  5 mg Oral BID  .  vecuronium  10 mg Intravenous Once  . DISCONTD: micafungin (MYCAMINE) IV  100 mg Intravenous Daily  . DISCONTD: piperacillin-tazobactam (ZOSYN)  IV  3.375 g Intravenous Q8H  . DISCONTD: vancomycin  750 mg Intravenous Q12H   Continuous Infusions:    . sodium chloride 10 mL/hr at 05/31/11 0511  . sodium chloride 10 mL/hr at 05/31/11 0511  . sodium chloride 10 mL/hr at 06/01/11 0700  . dexmedetomidine (PRECEDEX) IV infusion for high rates 1 mcg/kg/hr (06/01/11 1130)  . feeding supplement (NEPRO CARB STEADY) 1,000 mL (05/31/11 1500)  . fentaNYL infusion INTRAVENOUS 100 mcg/hr (06/01/11 1100)  . DISCONTD: fentaNYL infusion INTRAVENOUS 50 mcg/hr (06/01/11 0500)   PRN Meds:.acetaminophen (TYLENOL) oral liquid 160 mg/5 mL, albuterol, hydrALAZINE, hydrocortisone cream, midazolam, ondansetron (ZOFRAN) IV   Objective: Weight change: 5 lb 4.7 oz (2.4 kg)  Intake/Output Summary (Last 24 hours) at 06/01/11 1305 Last data filed at 06/01/11 1100  Gross per 24 hour  Intake 3420.46 ml  Output   1832 ml  Net 1588.46 ml    Blood pressure 140/52, pulse 66, temperature 100.4 F (38 C), temperature source Oral, resp. rate 16, height 6\' 2"  (1.88 m), weight 225 lb 12 oz (102.4 kg), SpO2 100.00%. Temp:  [98.5 F (36.9 C)-100.9 F (38.3 C)] 100.4 F (38 C) (04/11 1130) Pulse Rate:  [58-67] 66  (04/11 1300) Resp:  [0-21] 16  (04/11 1300) BP: (116-156)/(40-57) 140/52 mmHg (04/11 1300) SpO2:  [99 %-100 %] 100 % (04/11 1300) FiO2 (%):  [40 %] 40 % (04/11 1150) Weight:  [225 lb 12 oz (102.4 kg)] 225 lb 12 oz (102.4 kg) (04/11 0400)  Physical Exam: General:  awake, trying to get at things with hands which are in mits HEENT: anicteric sclera, pupils reactive to light and accommodation, EOMI CVS regular rate, normal r,  no murmur rubs or gallops Chest: fairly  clear to auscultation bilaterally,Abdomen: soft ? Tender? , nondistended, normal bowel sounds, Extremities: no  clubbing or edema noted bilaterally Skin: no rashes, surgical site is clean Neuro: delirious but not focal deficitis  Lab Results:  Basename 06/01/11 0330 05/31/11 0436  WBC 10.2 11.2*  HGB 7.9* 8.5*  HCT 26.3* 28.1*  PLT 210 214    BMET  Basename 06/01/11 0330 05/31/11 0436  NA 145 145  K 3.7 3.7  CL 111 108  CO2 29 31  GLUCOSE 217* 256*  BUN 30* 38*  CREATININE 0.90 1.06  CALCIUM 9.3 9.6    Micro Results: Recent Results (from the past 240 hour(s))  CULTURE, BLOOD (ROUTINE X 2)     Status: Normal   Collection Time   05/24/11 12:40 PM      Component Value Range Status Comment   Specimen Description BLOOD LEFT ARM   Final    Special Requests BOTTLES DRAWN AEROBIC AND ANAEROBIC 10CC   Final    Culture  Setup Time [id number]   Final    Culture NO GROWTH 5 DAYS   Final    Report Status 05/30/2011 FINAL   Final   CULTURE, BLOOD (ROUTINE X 2)     Status: Normal   Collection Time   05/24/11 12:40 PM      Component Value Range Status Comment   Specimen Description BLOOD RIGHT WRIST   Final    Special Requests BOTTLES DRAWN AEROBIC  AND ANAEROBIC 10CC   Final    Culture  Setup Time [id number]   Final    Culture NO GROWTH 5 DAYS   Final  Report Status 05/30/2011 FINAL   Final   CULTURE, RESPIRATORY     Status: Normal   Collection Time   05/24/11  3:01 PM      Component Value Range Status Comment   Specimen Description TRACHEAL ASPIRATE   Final    Special Requests NONE   Final    Gram Stain     Final    Value: FEW WBC PRESENT,BOTH PMN AND MONONUCLEAR     NO SQUAMOUS EPITHELIAL CELLS SEEN     NO ORGANISMS SEEN   Culture RARE CANDIDA ALBICANS   Final    Report Status 05/26/2011 FINAL   Final   CULTURE, BLOOD (ROUTINE X 2)     Status: Normal (Preliminary result)   Collection Time   05/26/11  5:50 PM      Component Value Range Status Comment   Specimen Description BLOOD LEFT HAND   Final    Special Requests BOTTLES DRAWN AEROBIC ONLY 3CC   Final    Culture  Setup Time [id number]   Final    Culture     Final    Value:        BLOOD CULTURE RECEIVED NO GROWTH TO DATE CULTURE WILL BE HELD FOR 5 DAYS BEFORE ISSUING A FINAL NEGATIVE REPORT   Report Status PENDING   Incomplete   CULTURE, BLOOD (ROUTINE X 2)     Status: Normal (Preliminary result)   Collection Time   05/26/11  6:00 PM      Component Value Range Status Comment   Specimen Description BLOOD LEFT WRIST   Final    Special Requests     Final    Value: BOTTLES DRAWN AEROBIC AND ANAEROBIC 7CC AER 4CC ANA   Culture  Setup Time [id number]   Final    Culture     Final    Value:        BLOOD CULTURE RECEIVED NO GROWTH TO DATE CULTURE WILL BE HELD FOR 5 DAYS BEFORE ISSUING A FINAL NEGATIVE REPORT   Report Status PENDING   Incomplete     Studies/Results: Ct Abdomen Pelvis Wo Contrast  05/30/2011  *RADIOLOGY REPORT*  Clinical Data: Fever of unknown origin, renal transplant  CT ABDOMEN AND PELVIS WITHOUT CONTRAST  Technique:  Multidetector CT imaging of the abdomen and pelvis was performed following the standard protocol without intravenous contrast.  Comparison: CT  chest dated 05/27/2011.  CT abdomen pelvis dated 01/12/2010.  Findings: Moderate right and small left pleural effusions, unchanged.  Associated bilateral lower lobe opacities, right greater than left, likely atelectasis.  Mild patchy atelectasis / scarring in the lingula.  Chronic right hilar/lower lobe calcifications, likely sequela of prior granulomatous disease.  Enteric tube terminates in the first portion of the duodenum.  Unenhanced liver is notable for a calcified granuloma in the posterior right hepatic dome (series 2/image 24).  Splenomegaly, measuring 16.5 cm in maximal dimension, mildly increased.  Multiple splenic granulomata.  Prostate and adrenal glands are within normal limits.  Gallbladder is unremarkable.  No intrahepatic or extrahepatic ductal dilatation.  Native renal atrophy.  1.9 cm left upper pole cyst (series 2/image 43).  Unenhanced right lower quadrant renal transplant is grossly unremarkable, without hydronephrosis.  No evidence of bowel obstruction.  Normal appendix.  Moderate stool in the distal colon/rectum.  Atherosclerotic calcifications of the abdominal aorta and branch vessels.  No abdominopelvic ascites.  No suspicious abdominopelvic lymphadenopathy.  Brachytherapy seeds in the prostate.  Bladder is decompressed with indwelling Foley catheter and nondependent  gas.  Mild surrounding perivesical stranding (series 2/image 92).  Small fat-containing right inguinal hernia.  Degenerative changes of the visualized thoracolumbar spine.  IMPRESSION: Moderate right and small left pleural effusions, unchanged. Associated bilateral lower lobe opacities, right greater than left, likely atelectasis.  Normal appendix.  No evidence of bowel obstruction.  Moderate stool in the distal colon/rectum.  Unenhanced right lower quadrant renal transplant is grossly unremarkable.  No hydronephrosis.  Bladder is decompressed with indwelling Foley catheter.  Mild surrounding perivesical stranding, correlate  for cystitis.  Original Report Authenticated By: Charline Bills, M.D.   Dg Chest Port 1 View  06/01/2011  *RADIOLOGY REPORT*  Clinical Data: Follow up respiratory failure  PORTABLE CHEST - 1 VIEW  Comparison: 05/31/2011  Findings: Cardiomediastinal silhouette is stable.  Stable endotracheal and NG tube position.  Stable left IJ central line position.  Status post median sternotomy.  Persistent mild interstitial prominence bilaterally probable mild interstitial edema.  Again noted bilateral pleural effusions right greater than left with bilateral lower lobe atelectasis or infiltrate.  IMPRESSION: Stable support apparatus.  Persistent mild interstitial prominence bilaterally probable mild edema.  Stable bilateral pleural effusions right greater than left with bilateral lower lobe atelectasis or infiltrate.  Original Report Authenticated By: Natasha Mead, M.D.   Dg Chest Port 1 View  05/31/2011  *RADIOLOGY REPORT*  Clinical Data: Ventilator.  PORTABLE CHEST - 1 VIEW  Comparison: Portable chest 05/30/2011 peri  Findings: The heart is enlarged.  The patient remains intubated. Endotracheal tube is in this table position.  A left IJ line is stable.  The right pleural effusion is stable.  Interstitial airspace disease has increased, right greater than left.  IMPRESSION:  1.  Increased interstitial airspace disease.  While this could represent edema, infection is not excluded. 2.  Stable cardiomegaly and pleural effusion. 3.  The support apparatus is stable.  Original Report Authenticated By: Jamesetta Orleans. MATTERN, M.D.      Assessment/Plan: John [name] is a 71 y.o. male with COPD, renal transplant who underwent laminectomy on 26th of March complicated by respiratory failure, intubation, who has been febrile and on broad spectrum antibiotics including vanco/zosyn >8 days and now mycafungin. Urine cx only small amt of morganella. CT chest abdomen pelvis and L spine all unremarkable as is CT abdomen and  pelvis. He is off antibiotics for nearly one day now   1) FUO:  --continue to observe off antibiotics --would culture his pleural fluid --if his pleural fluid is unremarkable I will likely sign off  Please call with further questions      LOS: 16 days   Acey Lav 06/01/2011, 1:05 PM

## 2011-06-02 ENCOUNTER — Inpatient Hospital Stay (HOSPITAL_COMMUNITY): Payer: Medicare Other

## 2011-06-02 ENCOUNTER — Encounter (HOSPITAL_COMMUNITY): Payer: Medicare Other

## 2011-06-02 LAB — CULTURE, BLOOD (ROUTINE X 2)
Culture  Setup Time: 201304060145
Culture: NO GROWTH

## 2011-06-02 LAB — RENAL FUNCTION PANEL
BUN: 30 mg/dL — ABNORMAL HIGH (ref 6–23)
CO2: 29 mEq/L (ref 19–32)
Chloride: 109 mEq/L (ref 96–112)
Glucose, Bld: 155 mg/dL — ABNORMAL HIGH (ref 70–99)
Potassium: 3.5 mEq/L (ref 3.5–5.1)

## 2011-06-02 LAB — CBC
MCHC: 30 g/dL (ref 30.0–36.0)
RDW: 19.1 % — ABNORMAL HIGH (ref 11.5–15.5)
WBC: 8.4 10*3/uL (ref 4.0–10.5)

## 2011-06-02 LAB — GLUCOSE, CAPILLARY
Glucose-Capillary: 161 mg/dL — ABNORMAL HIGH (ref 70–99)
Glucose-Capillary: 85 mg/dL (ref 70–99)
Glucose-Capillary: 163 mg/dL — ABNORMAL HIGH (ref 70–99)

## 2011-06-02 LAB — TYPE AND SCREEN
ABO/RH(D): O POS
Antibody Screen: NEGATIVE

## 2011-06-02 MED ORDER — POTASSIUM CHLORIDE 10 MEQ/50ML IV SOLN
INTRAVENOUS | Status: AC
  Filled 2011-06-02: qty 50

## 2011-06-02 MED ORDER — POTASSIUM CHLORIDE 10 MEQ/50ML IV SOLN
INTRAVENOUS | Status: AC
  Administered 2011-06-02: 10 meq via INTRAVENOUS
  Filled 2011-06-02: qty 50

## 2011-06-02 MED ORDER — INSULIN ASPART 100 UNIT/ML ~~LOC~~ SOLN
0.0000 [IU] | SUBCUTANEOUS | Status: DC
  Administered 2011-06-02: 5 [IU] via SUBCUTANEOUS
  Administered 2011-06-02 – 2011-06-03 (×6): 3 [IU] via SUBCUTANEOUS
  Administered 2011-06-03: 11 [IU] via SUBCUTANEOUS
  Administered 2011-06-03: 8 [IU] via SUBCUTANEOUS
  Administered 2011-06-04 (×2): 3 [IU] via SUBCUTANEOUS
  Administered 2011-06-04: 8 [IU] via SUBCUTANEOUS
  Administered 2011-06-04: 5 [IU] via SUBCUTANEOUS
  Administered 2011-06-04: 3 [IU] via SUBCUTANEOUS
  Administered 2011-06-04 – 2011-06-05 (×4): 5 [IU] via SUBCUTANEOUS
  Administered 2011-06-05 (×2): 3 [IU] via SUBCUTANEOUS
  Administered 2011-06-05: 8 [IU] via SUBCUTANEOUS
  Administered 2011-06-06: 2 [IU] via SUBCUTANEOUS
  Administered 2011-06-06: 3 [IU] via SUBCUTANEOUS
  Administered 2011-06-06: 8 [IU] via SUBCUTANEOUS
  Administered 2011-06-06 – 2011-06-07 (×3): 5 [IU] via SUBCUTANEOUS

## 2011-06-02 MED ORDER — LIDOCAINE HCL (CARDIAC) 20 MG/ML IV SOLN
INTRAVENOUS | Status: AC
  Filled 2011-06-02: qty 5

## 2011-06-02 MED ORDER — POTASSIUM CHLORIDE 10 MEQ/100ML IV SOLN: 10.0000 meq | INTRAVENOUS | Status: DC

## 2011-06-02 MED ORDER — HALOPERIDOL LACTATE 5 MG/ML IJ SOLN
5.0000 mg | Freq: Once | INTRAMUSCULAR | Status: AC
  Administered 2011-06-02: 5 mg via INTRAVENOUS
  Filled 2011-06-02: qty 1

## 2011-06-02 MED ORDER — SUCCINYLCHOLINE CHLORIDE 20 MG/ML IJ SOLN
INTRAMUSCULAR | Status: AC
  Filled 2011-06-02: qty 10

## 2011-06-02 MED ORDER — FENTANYL CITRATE 0.05 MG/ML IJ SOLN
25.0000 ug | INTRAMUSCULAR | Status: DC | PRN
  Administered 2011-06-02 (×2): 50 ug via INTRAVENOUS
  Administered 2011-06-02: 25 ug via INTRAVENOUS
  Administered 2011-06-03 – 2011-06-05 (×9): 50 ug via INTRAVENOUS
  Administered 2011-06-06: 25 ug via INTRAVENOUS
  Administered 2011-06-06 (×4): 50 ug via INTRAVENOUS
  Administered 2011-06-06: 25 ug via INTRAVENOUS
  Administered 2011-06-07 – 2011-06-08 (×4): 50 ug via INTRAVENOUS
  Filled 2011-06-02 (×22): qty 2

## 2011-06-02 MED ORDER — ETOMIDATE 2 MG/ML IV SOLN
INTRAVENOUS | Status: AC
  Filled 2011-06-02: qty 20

## 2011-06-02 MED ORDER — SODIUM CHLORIDE 0.9 % IV SOLN
0.4000 ug/kg/h | INTRAVENOUS | Status: DC
  Administered 2011-06-02: 0.6 ug/kg/h via INTRAVENOUS
  Filled 2011-06-02 (×2): qty 4

## 2011-06-02 MED ORDER — LORAZEPAM 2 MG/ML IJ SOLN
1.0000 mg | Freq: Once | INTRAMUSCULAR | Status: AC
  Administered 2011-06-02: 1 mg via INTRAVENOUS
  Filled 2011-06-02: qty 1

## 2011-06-02 MED ORDER — DEXTROSE 10 % IV SOLN
INTRAVENOUS | Status: DC
  Administered 2011-06-02 – 2011-06-04 (×2): 20 mL/h via INTRAVENOUS

## 2011-06-02 MED ORDER — FENTANYL 50 MCG/HR TD PT72
50.0000 ug | MEDICATED_PATCH | TRANSDERMAL | Status: DC
  Administered 2011-06-04 – 2011-06-07 (×2): 50 ug via TRANSDERMAL
  Filled 2011-06-02 (×2): qty 1

## 2011-06-02 MED ORDER — FENTANYL CITRATE 0.05 MG/ML IJ SOLN
12.5000 ug | INTRAMUSCULAR | Status: DC | PRN
  Administered 2011-06-02: 25 ug via INTRAVENOUS
  Filled 2011-06-02 (×2): qty 2

## 2011-06-02 MED ORDER — FUROSEMIDE 10 MG/ML IJ SOLN
80.0000 mg | Freq: Two times a day (BID) | INTRAMUSCULAR | Status: DC
  Filled 2011-06-02: qty 8

## 2011-06-02 MED ORDER — HALOPERIDOL LACTATE 5 MG/ML IJ SOLN
10.0000 mg | INTRAMUSCULAR | Status: AC | PRN
  Administered 2011-06-02 – 2011-06-03 (×3): 10 mg via INTRAVENOUS
  Filled 2011-06-02 (×3): qty 2

## 2011-06-02 MED ORDER — ROCURONIUM BROMIDE 50 MG/5ML IV SOLN
INTRAVENOUS | Status: AC
  Filled 2011-06-02: qty 2

## 2011-06-02 MED ORDER — POTASSIUM CHLORIDE 10 MEQ/50ML IV SOLN
10.0000 meq | INTRAVENOUS | Status: AC
  Administered 2011-06-02 (×6): 10 meq via INTRAVENOUS

## 2011-06-02 MED ORDER — FUROSEMIDE 10 MG/ML IJ SOLN
80.0000 mg | Freq: Two times a day (BID) | INTRAMUSCULAR | Status: AC
  Administered 2011-06-02: 80 mg via INTRAVENOUS

## 2011-06-02 MED ORDER — MIDAZOLAM HCL 2 MG/2ML IJ SOLN
INTRAMUSCULAR | Status: AC
  Filled 2011-06-02: qty 4

## 2011-06-02 NOTE — Evaluation (Signed)
Clinical/Bedside Swallow Evaluation Patient Details  Name: John [name] MRN: [id number] DOB: 1940-09-14 Today's Date: 06/02/2011  Past Medical History:  Past Medical History  Diagnosis Date  . CAD (coronary artery disease)     a. myoview 1/11: lg inf fixed defect;   b. cath 1/11: 3v CAD,  c. s/p CABG 1/11 c/b acute renal failure and AFib  . Atrial fibrillation   . Chronic diastolic heart failure     a. echo 4/11: EF 45-50%, mod LVH, LAE, inf and septal HK  . First degree AV block   . Orthostatic hypotension     pyridostigmine  . History of renal transplant   . DM2 (diabetes mellitus, type 2)   . Diabetic gastroparesis   . HTN (hypertension)   . HLD (hyperlipidemia)   . PAD (peripheral artery disease)     s/p R SFA-Pop atherectomy 11/11  . Prostate cancer   . ED (erectile dysfunction)   . DDD (degenerative disc disease), lumbar   . Tremor   . Obesity   . Low testosterone   . Chronic kidney disease     s/p renal transplant 2002  . Pleural effusion, right     loculated  . COPD (chronic obstructive pulmonary disease)   . Anxiety   . Diabetes mellitus     insulin pump   Past Surgical History:  Past Surgical History  Procedure Date  . Coronary artery bypass graft 03/2009  . Cataract extraction   . Vitrectomy   . Tonsillectomy   . Kidney transplant 2002  . Hernia repair   . Prostate cancer rx 2006 brachytherapy   . Prior peritoneal catheter placement   . Lumbar laminectomy/decompression microdiscectomy 05/16/2011    Procedure: LUMBAR LAMINECTOMY/DECOMPRESSION MICRODISCECTOMY 1 LEVEL;  Surgeon: Barnett Abu, MD;  Location: [facility];  Service: Neurosurgery;  Laterality: Bilateral;  Bilateral Lumbar Five-Sacral One Laminectomy   HPI:  Pt is a 71 year old male who underwent back surgery and developed respiratory distress requiring intubation post-op. Pt was intubated 4/3 - 4/12 and is tolerating trial extubation well at this point. Pt has a history of acute reversible  dysphagia s/p CABG operation in 2011 requiring a couple of weeks of thickened liquids due to silent aspiration. Other PMH: COPD, DM, CHF   Assessment/Recommendations/Treatment Plan    SLP Assessment Clinical Impression Statement: Pt presents with signs of reduced airway protection due to presumed laryngeal/pharyngeal edema including dysphonia, weak cough, delayed weak swallow reponse. Pt likely with an acute reversible dysphagia s/p 10 days of intubation. At this time, feel pt would benefit from another 12 hours of recovery prior to initiating PO diet or objective testing given high risk of silent aspiration and history of silent aspiration and dysphagia post surgery in the past. SLP will reasses in am tomorrow for PO/MBS readiness. Continue MD recommendation NPO except ice chips following oral care.   Risk for Aspiration: Moderate Other Related Risk Factors: History of dysphagia;History of esophageal-related issues;Cognitive impairment;Decreased respiratory status  Swallow Evaluation Recommendations Diet Recommendations: NPO;Ice chips PRN after oral care Supervision: Staff feed patient Oral Care Recommendations: Oral care QID Follow up Recommendations: Inpatient Rehab  Treatment Plan Treatment Plan Recommendations: Therapy as outlined in treatment plan below Speech Therapy Frequency: min 2x/week Treatment Duration: 2 weeks Interventions: Aspiration precaution training;Pharyngeal strengthening exercises;Compensatory techniques;Patient/family education;Trials of upgraded texture/liquids;Diet toleration management by SLP  Individuals Consulted Consulted and Agree with Results and Recommendations: Family member/caregiver  Swallowing Goals  SLP Swallowing Goals Goal #3: Pt will consume  PO trials with adequate breath support/phonation to determine readiness for diet vs. need for objective testing.   Harlon Ditty, MA CCC-SLP [phone]  Claudine Mouton 06/02/2011,2:58 PM

## 2011-06-02 NOTE — Progress Notes (Signed)
CSW reviewed chart and note pt is accepted for LTAC/CIR. CSW will follow in background and available, if needed, for SNF placement, but hopeful for LTAC or CIR for this pt.  Baxter Flattery, MSW [phone] For Angelia Mould

## 2011-06-02 NOTE — Progress Notes (Signed)
HPI: 71 yo male with hx COPD, renal transplant (2002), DM on insulin pump who presented 3/26 for lumbar laminotomies and decompression L5 and S1 r/t spondylosis and herniated nucleus pulposus. Post op developed episodes of confusion and respiratory distress thought r/t CHF Which initially improved with lasix. On 3/31 developed worsening respiratory distress and hypoxia and PCCM consulted.   Antibiotics: (per ID) Vanc 3/31 >> 4/10 Zosyn 3/31 >> 4/10 micofungin 4/7 >> 4/10  Cultures/Sepsis Markers:   Urine 3/29>>> MORGANELLA MORGANII (6,000 COLONIES/ML)  BCx2 3/28>>> negative  Sputum 3/31>>>neg  Resp 4/3 >> rare candida Blood 4/3 >> NEG Blood 4/5 >> NEG   Access/Protocols:  L IJ CVL 3/31 >>  ET tube 4/3 >> 4/12  Best Practice:  DVT: SCDs GI: Pantoprazole   STUDIES: CT CHEST 05/27/11 Small left pleural effusion. Moderate sized right pleural effusion. Bilateral lower lobe posterior atelectasis or infiltrate. Mild emphysematous changes bilateral upper lobe Thoracentesis 4/11: 400 cc removed by IR.  Borderline exudate by chemistries  Subjective: RASS 0. + F/C. Marginally passed SBT. Trial of extubation. Looks marginal but OK for now post extubation  Physical Exam: Filed Vitals:   06/02/11 0945  BP: 148/60  Pulse: 92  Temp:   Resp: 23    Intake/Output Summary (Last 24 hours) at 06/02/11 0959 Last data filed at 06/02/11 0915  Gross per 24 hour  Intake 2869.32 ml  Output   2990 ml  Net -120.68 ml   Vent Mode:  [-] CPAP FiO2 (%):  [30 %-100 %] 50 % Set Rate:  [12 bmp] 12 bmp Vt Set:  [580 mL] 580 mL PEEP:  [5 cmH20] 5 cmH20 Pressure Support:  [5 cmH20-8 cmH20] 8 cmH20 Plateau Pressure:  [23 cmH20-25 cmH20] 23 cmH20  General: chronically ill appearing male, Intermittently agitated. + F/C. Neuro: No focal deficits CV: RRR s M.  PULM: Slightly coarse. No wheezes GI: abd soft, +bs.  Extremities: Warm and dry, trace BLE edema.   Labs: CBC:    Component Value  Date/Time   WBC 8.4 06/02/2011 0334   HGB 7.8* 06/02/2011 0334   HCT 26.0* 06/02/2011 0334   PLT 163 06/02/2011 0334   MCV 70.5* 06/02/2011 0334   NEUTROABS 9.1* 05/24/2011 0250   LYMPHSABS 1.7 05/24/2011 0250   MONOABS 1.3* 05/24/2011 0250   EOSABS 0.5 05/24/2011 0250   BASOSABS 0.0 05/24/2011 0250     Comprehensive Metabolic Panel:    Component Value Date/Time   NA 144 06/02/2011 0334   K 3.5 06/02/2011 0334   CL 109 06/02/2011 0334   CO2 29 06/02/2011 0334   BUN 30* 06/02/2011 0334   CREATININE 0.84 06/02/2011 0334   GLUCOSE 155* 06/02/2011 0334   CALCIUM 9.3 06/02/2011 0334   AST 25 06/01/2011 0330   ALT 10 06/01/2011 0330   ALKPHOS 92 06/01/2011 0330   BILITOT 0.6 06/01/2011 0330   PROT 5.6* 06/01/2011 0330   ALBUMIN 1.8* 06/02/2011 0334   ABG   Lab 05/31/11 0436  MG 2.2   Lab Results  Component Value Date   CALCIUM 9.3 06/02/2011   PHOS 3.0 06/02/2011    Assessment & Plan:  Acute respiratory failure  Extubated 4/12 and looks OK initially. Monitor closely post extubation. If requires re-inutbation, will proceed with tracheostomy tube placement  DM  Holding Lantus while NPO. Cont SSI  Chronic renal insufficiency s/p renal transplant 2002  Renal following. On Lasix - Cont cellcept, prograf.  - continue free water   Fever/Leukocytosis  -Per ID,Now off  all ABX per ID. Low grade fever persists without sign of infection -CVL in place for 7days, will change to PICC Line.  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 45 mins CCM time   Billy Fischer, MD;  PCCM service; Mobile  [phone]

## 2011-06-02 NOTE — Progress Notes (Signed)
Pt is very agitated, anxious and confused; pulling at devices; expressing that he is not going to make it. I am very concerned for his safety; MD camera in; orders received; pt reoriented; bed alarm on; call bell in place; will continue to monitor closely and update as needed

## 2011-06-02 NOTE — Procedures (Signed)
Extubation Procedure Note  Patient Details:   Name: John [name] DOB: 24-Feb-1940 MRN: [id number]    Pt extubated per MD order, tolerating well, VS WNL, no stridor noted, RT to monitor.  Evaluation  O2 sats: stable throughout Complications: No apparent complications Patient did tolerate procedure well. Bilateral Breath Sounds: Diminished Suctioning: Airway Yes  Harley Hallmark 06/02/2011, 9:32 AM

## 2011-06-02 NOTE — Progress Notes (Signed)
eLink Physician-Brief Progress Note Patient Name: John [name] DOB: 11-Nov-1940 MRN: [id number]  Date of Service  06/02/2011   HPI/Events of Note  Anxiety/Agitation - patient extubated today.  Sats are greater than 95% and WOB is WNL along with stable hemodynamics.  Camera evaluation shows the patient to be anxious, shaking and agitated pulling at devices.  Nurses concerned for patient safety.   eICU Interventions  Plan: EKG shows QTc of less than 500 Haldol 5 mg dosing with redosing of 10 mg IV q30 min as needed for agitation Ativan 1 mg IV times one dose now   Intervention Category Minor Interventions: Agitation / anxiety - evaluation and management  Berkley Wrightsman 06/02/2011, 9:49 PM

## 2011-06-02 NOTE — Progress Notes (Signed)
Pts HR has steadily been decreasing from mid 60s to lower 50s.  Pt just had an episode of bradycardia dropping into the 30s.  MD notified; will continue to monitor closely and update as needed

## 2011-06-02 NOTE — Progress Notes (Signed)
   CARE MANAGEMENT NOTE 06/02/2011  Patient:  John [name], John [name]   Account Number:  [id number]  Date Initiated:  05/17/2011  Documentation initiated by:  Vance Peper  Subjective/Objective Assessment:   71 yr old male s/p bilateral laminectomies, and foraminotomies decompression L5-S1     Action/Plan:   PTA, PT LIVES WITH SPOUSE.   Anticipated DC Date:     Anticipated DC Plan:  HOME W HOME HEALTH SERVICES      DC Planning Services  CM consult      Choice offered to / List presented to:             Status of service:  In process, will continue to follow Medicare Important Message given?   (If response is "NO", the following Medicare IM given date fields will be blank) Date Medicare IM given:   Date Additional Medicare IM given:    Discharge Disposition:    Per UR Regulation:    If discussed at Long Length of Stay Meetings, dates discussed:    Comments:  06/02/11 Moiz Ryant,RN,BSN 1600 PT EXTUBATED TODAY, AND DID NOT HAVE TO HAVE TRACHEOSTOMY. PHYSICAL THERAPY CONSULT PENDING.  MET WITH PT'S DAUGHTER TO DISCUSS DC PLANNING.  SHE STATES PT HAS BEEN THROUGH [facility] BEFORE AND THEY WOULD PREFER THIS TO LTAC.  WILL AWAIT P.T. RECOMMENDATIONS--WILL NEED ORDER FOR REHAB CONSULT IF RECOMMENDED. Phone #[phone]

## 2011-06-02 NOTE — Progress Notes (Signed)
    Subjective:  Intubated, sedated.  Objective:  Vital Signs in the last 24 hours: Temp:  [98.4 F (36.9 C)-100.4 F (38 C)] 98.4 F (36.9 C) (04/12 0400) Pulse Rate:  [51-68] 56  (04/12 0700) Resp:  [11-19] 18  (04/12 0700) BP: (104-153)/(40-107) 117/46 mmHg (04/12 0700) SpO2:  [97 %-100 %] 100 % (04/12 0700) FiO2 (%):  [30 %-40 %] 30 % (04/12 0700) Weight:  [101.3 kg (223 lb 5.2 oz)] 101.3 kg (223 lb 5.2 oz) (04/12 0300)  Intake/Output from previous day: 04/11 0701 - 04/12 0700 In: 2968.6 [I.V.:1168.6; ZO/XW:9604; IV Piggyback:8] Out: 2887 [VWUJW:1191; Emesis/NG output:2]  Physical Exam: Pt is intubated, poorly responsive HEENT: normal Neck: JVP - normal Lungs: clear anteriorly CV: RRR without murmur or gallop Abd: soft, NT, Positive BS, no hepatomegaly Ext: mild diffuse edema Skin: warm/dry no rash  Lab Results:  Basename 06/02/11 0334 06/01/11 0330  WBC 8.4 10.2  HGB 7.8* 7.9*  PLT 163 210    Basename 06/02/11 0334 06/01/11 0330  NA 144 145  K 3.5 3.7  CL 109 111  CO2 29 29  GLUCOSE 155* 217*  BUN 30* 30*  CREATININE 0.84 0.90   No results found for this basename: TROPONINI:2,CK,MB:2 in the last 72 hours  Tele: sinus brady heart rate 50's  Assessment/Plan:  1. Acute on chronic diastolic CHF 2. Respiratory failure 3. CKD s/p renal transplant  Cardiac status stable. Plans noted for trach today.  Tonny Bollman, M.D. 06/02/2011, 8:10 AM

## 2011-06-02 NOTE — Progress Notes (Signed)
[facility]  Subjective:  Extubated earlier today.  02 sat 99% on 4L NP I/O yesterday 2968/2887 400 cc removed thoracentesis yesterday (7 WBC, LDH 126, TP 3.6--looks like transudate) Newe PICC line L UE Wife and daughter at bedside   Objective: Vital signs in last 24 hours: Blood pressure 114/41, pulse 76, temperature 98.4 F (36.9 C), temperature source Oral, resp. rate 31, height 6\' 2"  (1.88 m), weight 101.3 kg (223 lb 5.2 oz), SpO2 100.00%.    PHYSICAL EXAM General--awake, but confused, mumbling, weak Chest--rhonchi Heart--no rub Abd--nontender, allograft in RLQ Extr--trace edema  CXR this AM--B pl eff plus atelectasis vs infiltr  Date  Weight  5 Apr  94.7 kg   8 Apr  98.3 kg 10 Apr  100 kg 11Apr  102.4 kg  12 Apr  101.3 kg  Lab Results:   Lab 06/02/11 0334 06/01/11 0330 05/31/11 0436  NA 144 145 145  K 3.5 3.7 3.7  CL 109 111 108  CO2 29 29 31   BUN 30* 30* 38*  CREATININE 0.84 0.90 1.06  ALB -- -- --  GLUCOSE 155* -- --  CALCIUM 9.3 9.3 9.6  PHOS 3.0 2.7 2.4     Basename 06/02/11 0334 06/01/11 0330  WBC 8.4 10.2  HGB 7.8* 7.9*  HCT 26.0* 26.3*  PLT 163 210    Assessment/Plan: 1. S/P renal transplant--renal fct ok on cell cept, prograf, and steroids. prograf level 5.5 on 8 Apr (good)  2. Anemia--HGB 8.4, Fe/TIBC 12 % sat, ferritin 649--on IV Fe. Type and screen sent to lab in case PRBC needed  3. VDRF--R pl eff (tapped in past), bibasilar atelectasis/infiltr, plus less pulm vasc. Congestion. CVP 9 yetsreday.Weight has increased from 94.7 kg on 5 Apr to 101.3 kg today. Wt was 100.5 kg in my office Jan 2013. I suspect he's fluid overloaded. Dr. Delford Field has begun furosemide 40 BID--increase to 80 BID.  Supplemental K IV today 10 mEq x6.  Check K and Mg in AM  4. Hypernatremia-better.  D/c free water via FT (FT out) 5. DM--off pump, on SSI --glu low this AM--receiving D10W  6. 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 S/P  laminectomy 26 Mar--per neurosurg.  8. Fever (T100.9 at 3 AM 9 Apr). Off AB.      LOS: 17 days   John [name] F 06/02/2011,10:24 AM   .labalb

## 2011-06-02 NOTE — Progress Notes (Signed)
Inpatient Diabetes Program Recommendations  AACE/ADA: New Consensus Statement on Inpatient Glycemic Control (2009)  Target Ranges:  Prepandial:   less than 140 mg/dL      Peak postprandial:   less than 180 mg/dL (1-2 hours)      Critically ill patients:  140 - 180 mg/dL   Reason for Visit: Note patient extubated this AM.  Tube feeds on hold.  CBG's today=74 and 118 mg/dL.  IF feeds remain on hold, consider reducing correction to moderate q 4 hours.  Note patient has insulin pump that he used prior to admission.  See's Dr. Leslie Dales for Diabetes.  Currently he is on Lantus 20 units daily and correction insulin.  Discussed with RN.

## 2011-06-02 NOTE — Progress Notes (Signed)
Patient ID: John [name], male   DOB: May 20, 1940, 71 y.o.   MRN: [id number] Extubated, moves all 4 extremities, confused but talking,. Spoke with daughter

## 2011-06-03 ENCOUNTER — Inpatient Hospital Stay (HOSPITAL_COMMUNITY): Payer: Medicare Other

## 2011-06-03 DIAGNOSIS — I2510 Atherosclerotic heart disease of native coronary artery without angina pectoris: Secondary | ICD-10-CM

## 2011-06-03 LAB — CBC
Hemoglobin: 8.3 g/dL — ABNORMAL LOW (ref 13.0–17.0)
Hemoglobin: 8.1 g/dL — ABNORMAL LOW (ref 13.0–17.0)
MCHC: 30.5 g/dL (ref 30.0–36.0)
MCHC: 30.9 g/dL (ref 30.0–36.0)
RDW: 19.2 % — ABNORMAL HIGH (ref 11.5–15.5)
RDW: 19.5 % — ABNORMAL HIGH (ref 11.5–15.5)
WBC: 12.3 10*3/uL — ABNORMAL HIGH (ref 4.0–10.5)
WBC: 10.3 10*3/uL (ref 4.0–10.5)

## 2011-06-03 LAB — GLUCOSE, CAPILLARY
Glucose-Capillary: 161 mg/dL — ABNORMAL HIGH (ref 70–99)
Glucose-Capillary: 198 mg/dL — ABNORMAL HIGH (ref 70–99)
Glucose-Capillary: 316 mg/dL — ABNORMAL HIGH (ref 70–99)

## 2011-06-03 LAB — RENAL FUNCTION PANEL
Albumin: 2 g/dL — ABNORMAL LOW (ref 3.5–5.2)
BUN: 26 mg/dL — ABNORMAL HIGH (ref 6–23)
CO2: 27 mEq/L (ref 19–32)
Calcium: 9.4 mg/dL (ref 8.4–10.5)
Calcium: 9.4 mg/dL (ref 8.4–10.5)
Creatinine, Ser: 0.96 mg/dL (ref 0.50–1.35)
GFR calc Af Amer: >90 mL/min (ref >90)
GFR calc non Af Amer: 81 mL/min — ABNORMAL LOW (ref >90)
Glucose, Bld: 319 mg/dL — ABNORMAL HIGH (ref 70–99)
Sodium: 139 mEq/L (ref 135–145)

## 2011-06-03 LAB — MAGNESIUM: Magnesium: 1.7 mg/dL (ref 1.5–2.5)

## 2011-06-03 MED ORDER — MYCOPHENOLATE MOFETIL HCL 500 MG IV SOLR
0.5000 g | Freq: Two times a day (BID) | INTRAVENOUS | Status: DC
  Filled 2011-06-03 (×2): qty 500

## 2011-06-03 MED ORDER — QUETIAPINE FUMARATE 100 MG PO TABS
100.0000 mg | ORAL_TABLET | Freq: Once | ORAL | Status: AC
  Administered 2011-06-03: 100 mg via ORAL
  Filled 2011-06-03: qty 1

## 2011-06-03 MED ORDER — FUROSEMIDE 10 MG/ML IJ SOLN
80.0000 mg | Freq: Two times a day (BID) | INTRAMUSCULAR | Status: DC
  Administered 2011-06-03 – 2011-06-04 (×2): 80 mg via INTRAVENOUS
  Filled 2011-06-03 (×5): qty 8

## 2011-06-03 MED ORDER — METHYLPREDNISOLONE SODIUM SUCC 40 MG IJ SOLR
4.0000 mg | Freq: Every day | INTRAMUSCULAR | Status: DC
  Administered 2011-06-03: 4 mg via INTRAVENOUS
  Filled 2011-06-03 (×2): qty 0.1

## 2011-06-03 MED ORDER — MYCOPHENOLATE MOFETIL HCL 500 MG IV SOLR
500.0000 mg | Freq: Two times a day (BID) | INTRAVENOUS | Status: DC
  Administered 2011-06-03 (×2): 500 mg via INTRAVENOUS
  Filled 2011-06-03 (×6): qty 500

## 2011-06-03 MED ORDER — SODIUM CHLORIDE 0.9 % IV SOLN
0.2000 ug/kg/h | INTRAVENOUS | Status: AC
  Administered 2011-06-03: 0.5 ug/kg/h via INTRAVENOUS
  Administered 2011-06-03: 0.7 ug/kg/h via INTRAVENOUS
  Administered 2011-06-03: 0.5 ug/kg/h via INTRAVENOUS
  Administered 2011-06-03: 0.2 ug/kg/h via INTRAVENOUS
  Administered 2011-06-03: 0.8 ug/kg/h via INTRAVENOUS
  Administered 2011-06-04: 0.5 ug/kg/h via INTRAVENOUS
  Filled 2011-06-03 (×7): qty 2

## 2011-06-03 MED ORDER — POTASSIUM CHLORIDE 10 MEQ/50ML IV SOLN
10.0000 meq | INTRAVENOUS | Status: AC
  Administered 2011-06-03 (×2): 10 meq via INTRAVENOUS

## 2011-06-03 MED ORDER — HALOPERIDOL LACTATE 5 MG/ML IJ SOLN: 5.0000 mg | Freq: Once | INTRAMUSCULAR | Status: DC

## 2011-06-03 MED ORDER — PANTOPRAZOLE SODIUM 40 MG IV SOLR
40.0000 mg | INTRAVENOUS | Status: DC
  Administered 2011-06-03 – 2011-06-04 (×2): 40 mg via INTRAVENOUS
  Filled 2011-06-03 (×3): qty 40

## 2011-06-03 MED ORDER — POTASSIUM CHLORIDE 10 MEQ/50ML IV SOLN
10.0000 meq | INTRAVENOUS | Status: AC
  Administered 2011-06-03 (×4): 10 meq via INTRAVENOUS
  Filled 2011-06-03 (×6): qty 50

## 2011-06-03 NOTE — Progress Notes (Signed)
At 0600 pt had an incontinent bowel movement of brown, loose, soft stool.  At 0700, pt had a bowel movement of bloody clots.  MD made aware; MD came to bedside; Full, detailed report given to day RN.

## 2011-06-03 NOTE — Progress Notes (Signed)
Patient ID: John [name], male   DOB: July 26, 1940, 71 y.o.   MRN: [id number]    Subjective:  Extubated no trach  Agitated over night  Objective:  Vital Signs in the last 24 hours: Temp:  [98.3 F (36.8 C)-100 F (37.8 C)] 99 F (37.2 C) (04/13 0400) Pulse Rate:  [58-109] 109  (04/13 0700) Resp:  [17-32] 29  (04/13 0700) BP: (94-170)/(27-94) 170/68 mmHg (04/13 0700) SpO2:  [90 %-100 %] 93 % (04/13 0700) FiO2 (%):  [30 %-100 %] 50 % (04/12 0945) Weight:  [220 lb 14.4 oz (100.2 kg)] 220 lb 14.4 oz (100.2 kg) (04/13 0400)  Intake/Output from previous day: 04/12 0701 - 04/13 0700 In: 1109.2 [I.V.:799.2; IV Piggyback:310] Out: 3100 [Urine:3100]  Physical Exam: Pt is intubated, poorly responsive HEENT: normal Neck: JVP - normal Lungs: clear anteriorly CV: RRR without murmur or gallop Abd: soft, NT, Positive BS, no hepatomegaly Ext: mild diffuse edema Skin: warm/dry no rash  Lab Results:  Basename 06/03/11 0320 06/02/11 0334  WBC 12.3* 8.4  HGB 8.3* 7.8*  PLT 185 163    Basename 06/03/11 0320 06/02/11 0334  NA 139 144  K 3.8 3.5  CL 104 109  CO2 27 29  GLUCOSE 319* 155*  BUN 26* 30*  CREATININE 0.94 0.84   No results found for this basename: TROPONINI:2,CK,MB:2 in the last 72 hours  Tele: sinus brady heart rate 50's  Assessment/Plan:  1. Acute on chronic diastolic CHF 2. Respiratory failure 3. CKD s/p renal transplant  Cardiac status stable. Nice that trach not needed.  Plans for Precedex to help with agitation  Charlton Haws, M.D. 06/03/2011, 8:25 AM

## 2011-06-03 NOTE — Progress Notes (Signed)
Speech Language Pathology Dysphagia Treatment  Patient Details Name: RIDWAN BONDY MRN: [id number] DOB: 18-Sep-1940 Today's Date: 06/03/2011  SLP Assessment/Plan/Recommendation Assessment / Recommendations / Plan Clinical Impression Statement: Patient seen for diagositic treatment for po readiness following initial clinical swallow assessment completed on 06-02-11.  Patient continues with soft +s/s of aspiration s/p swallow of PO trials with increase in respiratory rate after each trial .  Recommend continued NPO status with exception of ice chips  s/p oral care secondary to high risk for aspiration.  ST to follow  next date for PO/MBSS readiness.   Initiate / Change Diet: NPO Plan: Continue with current plan of care Swallowing Goals  SLP Swallowing Goals Swallow Study Goal #3 - Progress: Progressing toward goal  General Respiratory Status: Supplemental O2 delivered via (comment) Behavior/Cognition: Agitated;Lethargic;Distractible;Requires cueing;Decreased sustained attention Oral Cavity - Dentition: Dentures, top;Dentures, not available Patient Positioning: Partially reclined  Oral Cavity - Oral Hygiene Does patient have any of the following "at risk" factors?: Nutritional status - inadequate;Other - dysphagia;Oxygen therapy - cannula, mask, simple oxygen devices Patient is HIGH RISK - Oral Care Protocol followed (see row info): Yes Patient is AT RISK - Oral Care Protocol followed (see row info): Yes   Dysphagia Treatment Treatment focused on: Patient/family/caregiver education Feeding: Total assist Pharyngeal Phase Signs & Symptoms: Suspected delayed swallow initiation;Multiple swallows;Immediate throat clear;Changes in respirations Type of cueing: Verbal Amount of cueing: Moderate  Moreen Fowler M.S., CCC-SLP [phone] [facility] 06/03/2011, 9:55 AM

## 2011-06-03 NOTE — Progress Notes (Addendum)
HPI: 71 yo male with hx COPD, renal transplant (2002), DM on insulin pump who presented 3/26 for lumbar laminotomies and decompression L5 and S1 r/t spondylosis and herniated nucleus pulposus. Post op developed episodes of confusion and respiratory distress thought r/t CHF Which initially improved with lasix. On 3/31 developed worsening respiratory distress and hypoxia and PCCM consulted.   Antibiotics: (per ID) Vanc 3/31 >> 4/10 Zosyn 3/31 >> 4/10 micofungin 4/7 >> 4/10  Cultures/Sepsis Markers:   Urine 3/29>>> MORGANELLA MORGANII (6,000 COLONIES/ML)  BCx2 3/28>>> negative  Sputum 3/31>>>neg  Resp 4/3 >> rare candida Blood 4/3 >> NEG Blood 4/5 >> NEG   Access/Protocols:  L IJ CVL 3/31 >> 4/12 ET tube 4/3 >> 4/12 LUE PICC 4/12 >>>  Best Practice:  DVT: SCDs GI: Pantoprazole   STUDIES: CT CHEST 05/27/11 Small left pleural effusion. Moderate sized right pleural effusion. Bilateral lower lobe posterior atelectasis or infiltrate. Mild emphysematous changes bilateral upper lobe Thoracentesis 4/11: 400 cc removed by IR.  Borderline exudate by chemistries  Subjective: RASS 0 to +1. Confused overnight requiring haldol & ativan   Physical Exam: Filed Vitals:   06/03/11 0700  BP: 170/68  Pulse: 109  Temp:   Resp: 29    Intake/Output Summary (Last 24 hours) at 06/03/11 0750 Last data filed at 06/03/11 0500  Gross per 24 hour  Intake 1069.18 ml  Output   3100 ml  Net -2030.82 ml   Vent Mode:  [-] CPAP FiO2 (%):  [30 %-100 %] 50 % Set Rate:  [12 bmp] 12 bmp Vt Set:  [580 mL] 580 mL PEEP:  [5 cmH20] 5 cmH20 Pressure Support:  [8 cmH20] 8 cmH20 Plateau Pressure:  [23 cmH20] 23 cmH20  General: chronically ill appearing male, Intermittently agitated. + F/C. Neuro: No focal deficits, follows commands intermittent CV: RRR s M.  PULM: Slightly coarse. No wheezes GI: abd soft, +bs.  Extremities: Warm and dry, trace BLE edema.   Labs: CBC:    Component Value Date/Time   WBC 12.3* 06/03/2011 0320   HGB 8.3* 06/03/2011 0320   HCT 27.2* 06/03/2011 0320   PLT 185 06/03/2011 0320   MCV 69.7* 06/03/2011 0320   NEUTROABS 9.1* 05/24/2011 0250   LYMPHSABS 1.7 05/24/2011 0250   MONOABS 1.3* 05/24/2011 0250   EOSABS 0.5 05/24/2011 0250   BASOSABS 0.0 05/24/2011 0250     Comprehensive Metabolic Panel:    Component Value Date/Time   NA 139 06/03/2011 0320   K 3.8 06/03/2011 0320   CL 104 06/03/2011 0320   CO2 27 06/03/2011 0320   BUN 26* 06/03/2011 0320   CREATININE 0.94 06/03/2011 0320   GLUCOSE 319* 06/03/2011 0320   CALCIUM 9.4 06/03/2011 0320   AST 25 06/01/2011 0330   ALT 10 06/01/2011 0330   ALKPHOS 92 06/01/2011 0330   BILITOT 0.6 06/01/2011 0330   PROT 5.6* 06/01/2011 0330   ALBUMIN 2.1* 06/03/2011 0320   ABG   Lab 06/03/11 0320  MG 1.7   Lab Results  Component Value Date   CALCIUM 9.4 06/03/2011   PHOS 2.2* 06/03/2011   pCRX-4/12 - BL effusions rt >> lt  Assessment & Plan:  Acute respiratory failure  Extubated 4/12 -has good cough & can maintain airway   Encephalopathy  ?etiol -delerium, Hx of Bipolar on multiple neurogenic meds RESUME Precedex max of 0.8 Doubt he will pass swallow evaln -place panda Resume psychotropic meds incl seroquel, monitor qT   DM  Holding Lantus while NPO. Cont SSI OK  if sugars fluctuate over next 24 h  Chronic renal insufficiency s/p renal transplant 2002  Renal following. On Lasix - Cont cellcept, prograf.  - continue free water   Fever/Leukocytosis  -Per ID,Now off all ABX per ID. Low grade fever persists without sign of infection -CVL changed to PICC Line. -chk pct in am, blood cx x 2  Lower GI bleed - RN reports passing clots this am, rechk CBC, no heparin   Depression Cont outpt psych med regimen   Chronic bilateral pleural effusions R>L   Thoracentesis 4/11 - 400 cc of borderline exudate by chemistries   Best practices / Disposition  -->ICU status under PCCM  -->SCD's for DVT Px  -->Protonix for GI  Px   Cyril Mourning MD. Tonny Bollman. [facility] Pager [phone] If no response call [phone]

## 2011-06-03 NOTE — Progress Notes (Signed)
Pt continues to be anxious, agitated and pulling at foley catheter and attempting to undress himself; pt reoriented; denies any needs or wants; emotional support given to pt; will continue to monitor closely and update as needed

## 2011-06-03 NOTE — Progress Notes (Signed)
While looking at monitor; noticed that pt did not have any P waves - when going back and reviewing his strips from the day, noticed that the last P wave that was noticeable was around 16 06/02/11.  EKG obtained; noted to be in accelerated junctional rhythm.  MD notified; no new orders received; will continue to monitor closely and update as needed

## 2011-06-03 NOTE — Progress Notes (Signed)
MD updated on pt's condition; pt is slightly less agitated but still pulling at tubes and moaning;  MD notified of pt's blood glucose levels; ordered to continue with D10.  Will continue to monitor closely and update as needed

## 2011-06-03 NOTE — Progress Notes (Signed)
OT Cancellation Note  Evaluation cancelled today due to medical issues with patient which prohibited therapy. Will re-attempt when pt. Medically stable.  Copeland Neisen, OTR/L Pager [phone] 06/03/2011, 1:36 PM

## 2011-06-03 NOTE — Progress Notes (Signed)
eLink Physician-Brief Progress Note Patient Name: TEMPLE SPORER DOB: February 27, 1940 MRN: [id number]  Date of Service  06/03/2011   HPI/Events of Note  Continued issues of ongoing agitation/anxiety.  Is currently NPO awaiting swallowing evaluation.  Some response to haldol but currently awake figiting taking off oxygen, etc   eICU Interventions  Plan: 5 mg haldol IV times one Swallowing evaluation in am - consider oral psychotrophic meds   Intervention Category Minor Interventions: Agitation / anxiety - evaluation and management  Aneka Fagerstrom 06/03/2011, 6:54 AM

## 2011-06-03 NOTE — Progress Notes (Signed)
PT/OT/SLP Cancellation Note  Treatment cancelled today due to medical issues with patient which prohibited therapy, will try back 4/14 to see if pt is appropriate to participate.  06/03/2011  [place] Bing, PT [phone] (pager)

## 2011-06-03 NOTE — Progress Notes (Signed)
Discussed with E-LINK MD that rounding MD progress notes state that pt is receiving lasix, but lasix was not actually ordered. Lasix ordered by e-link MD.  Also discussed that q 1 hour neuro checks are making it very difficult for patient to become less anxious and agitated as every time the patient is able to rest RN has to wake him up for a neuro check.  Orders received to reduce the frequency of neuro checks to q4 hours.  Will continue to monitor.

## 2011-06-03 NOTE — Progress Notes (Signed)
[facility]  Subjective:  Awake, agitated, not making complete sentences.  Wife and daughter at bedside.  Not taking anything po and no feeding tube--hasn't had any anti-rejection meds since yesterday   Objective: Vital signs in last 24 hours: Blood pressure 136/54, pulse 94, temperature 99 F (37.2 C), temperature source Oral, resp. rate 28, height 6\' 2"  (1.88 m), weight 100.2 kg (220 lb 14.4 oz), SpO2 95.00%.    PHYSICAL EXAM General--extubated Chest--rhonchi Heart--no rub Abd--nontender Extr--tr edema  Lab Results:   Lab 06/03/11 0830 06/03/11 0320 06/02/11 0334  NA 146* 139 144  K 3.6 3.8 3.5  CL 110 104 109  CO2 26 27 29   BUN 26* 26* 30*  CREATININE 0.96 0.94 0.84  ALB -- -- --  GLUCOSE 197* -- --  CALCIUM 9.4 9.4 9.3  PHOS 2.1* 2.2* 3.0     Basename 06/03/11 0830 06/03/11 0320  WBC 10.3 12.3*  HGB 8.1* 8.3*  HCT 26.2* 27.2*  PLT 230 185     Assessment/Plan:  1. S/P renal transplant--renal fct ok off cell cept, prograf, and steroids. prograf level 5.5 on 8 Apr (good).  Will give  Cell cept  And steroids IV today until panda tube in--then can give all via FT. No IV  prograf  2. Anemia--HGB 8.4, Fe/TIBC 12 % sat, ferritin 649--on IV Fe. Type and screen sent to lab in case PRBC needed  3. VDRF--R pl eff (tapped this week), bibasilar atelectasis/infiltr, plus less pulm vasc. Congestion. CVP 11 earlier today. Weight has changed from 94.7 kg on 5 Apr to 101.3 kg on 12 Apr to 100 kg today. Wt was 100.5 kg in my office Jan 2013. I suspect he's fluid overloaded. Dr. Delford Field has begun furosemide 40 BID--increase to 80 BID. Supplemental K IVagain  today 10 mEq x6. Check K and Mg in AM  4. Hypernatremia-better. D/c free water via FT (FT out)  5. DM--off pump, on SSI --glu low this AM--receiving D10W 20/hr 6. 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 S/P laminectomy 26 Mar--per neurosurg.  8. Fever (T101.1 earlier today) culture blood and urine  sent   LOS: 18 days   Ethelyne Erich F 06/03/2011,11:01 AM   .labalb

## 2011-06-03 NOTE — Progress Notes (Signed)
 of fentanyl and 50ml of precedex wasted in sink; verified by 2nd RN, Seth Bake.

## 2011-06-03 NOTE — Progress Notes (Signed)
Patient ID: John [name], male   DOB: 1940/05/12, 71 y.o.   MRN: [id number] Subjective:  The patient is alert and pleasant and mildly confused. I spoke to his wife and daughter.  Objective: Vital signs in last 24 hours: Temp:  [98.3 F (36.8 C)-100 F (37.8 C)] 99 F (37.2 C) (04/13 0400) Pulse Rate:  [66-109] 109  (04/13 0700) Resp:  [17-32] 29  (04/13 0700) BP: (94-170)/(27-94) 170/68 mmHg (04/13 0700) SpO2:  [90 %-100 %] 93 % (04/13 0700) FiO2 (%):  [50 %-100 %] 50 % (04/12 0945) Weight:  [100.2 kg (220 lb 14.4 oz)] 100.2 kg (220 lb 14.4 oz) (04/13 0400)  Intake/Output from previous day: 04/12 0701 - 04/13 0700 In: 1109.2 [I.V.:799.2; IV Piggyback:310] Out: 3100 [Urine:3100] Intake/Output this shift:    Physical exam the patient is alert and moving all 4 extremities well. His motor strength is 4+ over 5 his bilateral gastrocnemius.  Lab Results:  [facility] 06/03/11 0320 06/02/11 0334  WBC 12.3* 8.4  HGB 8.3* 7.8*  HCT 27.2* 26.0*  PLT 185 163   BMET  Basename 06/03/11 0320 06/02/11 0334  NA 139 144  K 3.8 3.5  CL 104 109  CO2 27 29  GLUCOSE 319* 155*  BUN 26* 30*  CREATININE 0.94 0.84  CALCIUM 9.4 9.3    Studies/Results: Dg Chest Port 1 View  06/02/2011  *RADIOLOGY REPORT*  Clinical Data: Evaluate endotracheal tube  PORTABLE CHEST - 1 VIEW  Comparison: 06/01/2011; 05/31/2011; 05/30/2011  Findings: Grossly unchanged cardiac silhouette and mediastinal contours post median sternotomy CABG.  Interval advancement left upper extremity approach PICC line with tip now over the superior aspect of the right atrium.  Otherwise, stable positioning of support apparatus.  No pneumothorax.  Grossly unchanged small bilateral effusions and bilateral mid and lower lung heterogeneous air space opacities.  Grossly unchanged bones.  IMPRESSION: 1.  Interval advancement of left upper extremity approach PICC line with tip approximately 5.8 cm below the superior cavoatrial junction.   Retraction is recommended. 2.  Otherwise, stable position of support apparatus.  No pneumothorax. 3.  Grossly unchanged small bilateral effusions and bilateral mid and lower lung heterogeneous opacities, atelectasis versus infiltrate.  This was made a call report.  Original Report Authenticated By: Waynard Reeds, M.D.   Dg Chest Port 1 View  06/01/2011  *RADIOLOGY REPORT*  Clinical Data: Confirms central line placement.  PORTABLE CHEST - 1 VIEW  Comparison: Chest x-ray 06/01/2011.  Findings: There is a left-sided internal jugular central venous catheter with tip terminating in the distal superior vena cava. There is a left upper extremity PICC with tip terminating in the distal left innominate vein shortly before the junction with the superior vena cava. An endotracheal tube is in place with tip 4.9 cm above the carina.A nasogastric tube is seen extending into the stomach, however, the tip of the nasogastric tube extends below the lower margin of the image.  Lung volumes remain low.  There continues be extensive bibasilar opacities (right greater than left), which have slightly worsened on the right side, and are favored in large part to represents subsegmental atelectasis. Small bilateral pleural effusions (right greater than left).  Pulmonary vascular crowding without frank pulmonary edema.  Heart size is enlarged (unchanged). The patient is rotated to the right on today's exam, resulting in distortion of the mediastinal contours and reduced diagnostic sensitivity and specificity for mediastinal pathology.  Atherosclerotic calcifications within the arch of the aorta.  IMPRESSION: 1.  Support  apparatus, as above. 2.  Low lung volumes with persistent bibasilar opacities at have slightly worsened on the right, favored in large part to represent areas of atelectasis, although underlying air space consolidation is difficult to exclude. 3.  Small bilateral pleural effusions (right greater than left).  Original  Report Authenticated By: Florencia Reasons, M.D.   Dg Chest Port 1 View  06/01/2011  *RADIOLOGY REPORT*  Clinical Data: Status post thoracentesis  PORTABLE CHEST - 1 VIEW  Comparison: Portable chest x-ray from earlier today  Findings: Some of the right pleural effusion has been evacuated after right thoracentesis.  No pneumothorax is seen.  Basilar linear atelectasis remains and cardiomegaly is stable.  The endotracheal tube and left IJ central venous line are unchanged in position.  Median sternotomy sutures are noted.  IMPRESSION: Some decrease in volume of the right pleural effusion after right thoracentesis.  No pneumothorax.  Original Report Authenticated By: Juline Patch, M.D.    Assessment/Plan: Status post lumbar surgery: The patient is stable. He is tolerating extubation so far.  LOS: 18 days     Anastassia Noack D 06/03/2011, 9:20 AM

## 2011-06-04 LAB — CBC
HCT: 25.3 % — ABNORMAL LOW (ref 39.0–52.0)
Hemoglobin: 7.9 g/dL — ABNORMAL LOW (ref 13.0–17.0)
RBC: 3.61 MIL/uL — ABNORMAL LOW (ref 4.22–5.81)
WBC: 7.7 10*3/uL (ref 4.0–10.5)

## 2011-06-04 LAB — RENAL FUNCTION PANEL
BUN: 29 mg/dL — ABNORMAL HIGH (ref 6–23)
CO2: 26 mEq/L (ref 19–32)
Chloride: 108 mEq/L (ref 96–112)
Creatinine, Ser: 0.89 mg/dL (ref 0.50–1.35)
Glucose, Bld: 275 mg/dL — ABNORMAL HIGH (ref 70–99)

## 2011-06-04 LAB — GLUCOSE, CAPILLARY
Glucose-Capillary: 270 mg/dL — ABNORMAL HIGH (ref 70–99)
Glucose-Capillary: 195 mg/dL — ABNORMAL HIGH (ref 70–99)
Glucose-Capillary: 174 mg/dL — ABNORMAL HIGH (ref 70–99)
Glucose-Capillary: 171 mg/dL — ABNORMAL HIGH (ref 70–99)

## 2011-06-04 LAB — URINE CULTURE

## 2011-06-04 MED ORDER — PANTOPRAZOLE SODIUM 40 MG PO PACK
40.0000 mg | PACK | Freq: Two times a day (BID) | ORAL | Status: DC
  Administered 2011-06-04 – 2011-06-06 (×5): 40 mg
  Filled 2011-06-04 (×7): qty 20

## 2011-06-04 MED ORDER — MYCOPHENOLATE MOFETIL 200 MG/ML PO SUSR: 500.0000 mg | Freq: Two times a day (BID) | ORAL | Status: DC

## 2011-06-04 MED ORDER — DEXMEDETOMIDINE HCL 100 MCG/ML IV SOLN
0.2000 ug/kg/h | INTRAVENOUS | Status: DC
  Administered 2011-06-04: 0.6 ug/kg/h via INTRAVENOUS
  Administered 2011-06-05 (×2): 0.5 ug/kg/h via INTRAVENOUS
  Filled 2011-06-04 (×3): qty 2

## 2011-06-04 MED ORDER — MYCOPHENOLATE 200 MG/ML ORAL SUSPENSION: 500.0000 mg | Freq: Two times a day (BID) | ORAL | Status: DC

## 2011-06-04 MED ORDER — FUROSEMIDE 8 MG/ML PO SOLN
80.0000 mg | Freq: Two times a day (BID) | ORAL | Status: AC
  Administered 2011-06-04 – 2011-06-05 (×3): 80 mg via ORAL
  Filled 2011-06-04 (×4): qty 10

## 2011-06-04 MED ORDER — TACROLIMUS 1 MG/ML ORAL SUSPENSION: 5.0000 mg | Freq: Two times a day (BID) | ORAL | Status: DC

## 2011-06-04 MED ORDER — PANTOPRAZOLE SODIUM 40 MG PO TBEC: 40.0000 mg | DELAYED_RELEASE_TABLET | Freq: Two times a day (BID) | ORAL | Status: DC

## 2011-06-04 MED ORDER — MYCOPHENOLATE MOFETIL 200 MG/ML PO SUSR
500.0000 mg | Freq: Two times a day (BID) | ORAL | Status: DC
  Administered 2011-06-04 – 2011-06-08 (×9): 500 mg via ORAL
  Filled 2011-06-04 (×10): qty 2.5

## 2011-06-04 MED ORDER — POTASSIUM CHLORIDE 20 MEQ/15ML (10%) PO LIQD
40.0000 meq | Freq: Two times a day (BID) | ORAL | Status: AC
  Administered 2011-06-04 – 2011-06-05 (×3): 40 meq via ORAL
  Filled 2011-06-04 (×4): qty 30

## 2011-06-04 MED ORDER — FERUMOXYTOL INJECTION 510 MG/17 ML
510.0000 mg | Freq: Once | INTRAVENOUS | Status: AC
  Administered 2011-06-04: 510 mg via INTRAVENOUS
  Filled 2011-06-04: qty 17

## 2011-06-04 MED ORDER — MYCOPHENOLATE 200 MG/ML ORAL SUSPENSION
500.0000 mg | Freq: Two times a day (BID) | ORAL | Status: DC
  Filled 2011-06-04 (×4): qty 10

## 2011-06-04 MED ORDER — PREDNISONE 5 MG/5ML PO SOLN
5.0000 mg | Freq: Every day | ORAL | Status: DC
  Administered 2011-06-05 – 2011-06-08 (×4): 5 mg via ORAL
  Filled 2011-06-04 (×5): qty 5

## 2011-06-04 MED ORDER — INSULIN GLARGINE 100 UNIT/ML ~~LOC~~ SOLN
10.0000 [IU] | Freq: Every day | SUBCUTANEOUS | Status: DC
  Administered 2011-06-04 – 2011-06-05 (×2): 10 [IU] via SUBCUTANEOUS

## 2011-06-04 MED ORDER — JEVITY 1.2 CAL PO LIQD
1000.0000 mL | ORAL | Status: DC
  Administered 2011-06-04: 1000 mL
  Administered 2011-06-06: 05:00:00
  Filled 2011-06-04 (×2): qty 1000

## 2011-06-04 NOTE — Progress Notes (Signed)
eLink Physician-Brief Progress Note Patient Name: John [name] DOB: Oct 02, 1940 MRN: [id number]  Date of Service  06/04/2011   HPI/Events of Note   rn calling elink saying that patient stools are loose since tube feeds. Some sacral excoriations +  eICU Interventions  flexiseal   Intervention Category Intermediate Interventions: Other:  John [name] 06/04/2011, 3:35 PM

## 2011-06-04 NOTE — Progress Notes (Signed)
Speech Language Pathology Dysphagia Treatment  Patient Details Name: John [name] MRN: [id number] DOB: February 09, 1941 Today's Date: 06/04/2011  SLP Assessment/Plan/Recommendation Assessment / Recommendations / Plan Clinical Impression Statement: Patient seen for diagnostic treatment for possible initiation of PO diet. Patient with PT when SLP entered room.  Patient 's daughter in room throughout treatment.  Improvement this treatment in areas of LOA and vocal quality ( see previous speech notes).  Initiation of swallow and laryngeal elevation with PO trials inconsistent. Observed difficulty coordinating swallow and respiration after trials of puree .   +s/s of aspiration before and after swallow of thin water by cup.  Cough/throat clears following trials of water continues to be weak and ineffective indicating poor airway protection.   RR sporadic throughout treatment from 23 to 51 but patient maintained oxygen saturations at 96.  Recommend continued NPO status ,with exception of ice chips ,and temporary means of nutrition/hydration.  Recommend objective assessment to assess risk for aspiration and recommend safest PO diet.  ST to follow on 06/05/11 to for readiness for MBS.  Plan: Continue with current plan of care;New goals to be determined pending objective testing Swallowing Goals  SLP Swallowing Goals Swallow Study Goal #3 - Progress: Progressing toward goal  General Temperature Spikes Noted: No Respiratory Status: Supplemental O2 delivered via (comment) Behavior/Cognition: Alert;Requires cueing Oral Cavity - Dentition: Dentures, top Patient Positioning: Upright in bed  Oral Cavity - Oral Hygiene Does patient have any of the following "at risk" factors?: Oxygen therapy - cannula, mask, simple oxygen devices;Other - dysphagia Patient is HIGH RISK - Oral Care Protocol followed (see row info): Yes   Dysphagia Treatment Treatment focused on: Patient/family/caregiver education;Facilitation of  pharyngeal phase Treatment Methods/Modalities: Skilled observation Patient observed directly with PO's: Yes Type of PO's observed: Dysphagia 1 (puree);Thin liquids;Ice chips Liquids provided via: Teaspoon;Cup Pharyngeal Phase Signs & Symptoms: Suspected delayed swallow initiation;Multiple swallows;Delayed cough;Changes in respirations Amount of cueing: Moderate  Moreen Fowler M.S.,CCC-SLP [phone] [facility] 06/04/2011, 1:18 PM

## 2011-06-04 NOTE — Progress Notes (Signed)
While attempting to place flexi- seal felt large amount of hard stool.  Manually disimpacted large amount of stool.  Dr. Delford Field on floor and notified and received orders for soap suds enema.  Enema given and moderate amount of stool resulted.  Pt tolerated well and reported relief.  Did not place flexi-seal.  Will continue to monitor.

## 2011-06-04 NOTE — Progress Notes (Signed)
HPI: 71 yo male with hx COPD, renal transplant (2002), DM on insulin pump who presented 3/26 for lumbar laminotomies and decompression L5 and S1 r/t spondylosis and herniated nucleus pulposus. Post op developed episodes of confusion and respiratory distress thought r/t CHF Which initially improved with lasix. On 3/31 developed worsening respiratory distress and hypoxia and PCCM consulted.   Antibiotics: (per ID) Vanc 3/31 >> 4/10 Zosyn 3/31 >> 4/10 micofungin 4/7 >> 4/10  Cultures/Sepsis Markers:   Urine 3/29>>> MORGANELLA MORGANII (6,000 COLONIES/ML)  BCx2 3/28>>> negative  Sputum 3/31>>>neg  Resp 4/3 >> rare candida Blood 4/3 >> NEG Blood 4/5 >> NEG Blood 4/13 >> Urine 4/13 >>   Access/Protocols:  L IJ CVL 3/31 >> 4/12 ET tube 4/3 >> 4/12 LUE PICC 4/12 >>>  Best Practice:  DVT: SCDs GI: Pantoprazole   STUDIES: CT CHEST 05/27/11 Small left pleural effusion. Moderate sized right pleural effusion. Bilateral lower lobe posterior atelectasis or infiltrate. Mild emphysematous changes bilateral upper lobe Thoracentesis 4/11: 400 cc removed by IR.  Borderline exudate by chemistries  Subjective: RASS 0 to +1. Confused & agitated  this am   Physical Exam: Filed Vitals:   06/04/11 0800  BP: 131/49  Pulse: 75  Temp:   Resp: 31    Intake/Output Summary (Last 24 hours) at 06/04/11 0828 Last data filed at 06/04/11 0600  Gross per 24 hour  Intake 1568.91 ml  Output   2325 ml  Net -756.09 ml      General: chronically ill appearing male, Intermittently agitated. + F/C. Neuro: No focal deficits, follows commands , more lucid, could read the clock & date, remembers some of his doctors CV: RRR s M.  PULM: Slightly coarse. No wheezes GI: abd soft, +bs.  Extremities: Warm and dry, trace BLE edema.   Labs: CBC:    Component Value Date/Time   WBC 7.7 06/04/2011 0410   HGB 7.9* 06/04/2011 0410   HCT 25.3* 06/04/2011 0410   PLT 146* 06/04/2011 0410   MCV 70.1* 06/04/2011 0410   NEUTROABS 9.1* 05/24/2011 0250   LYMPHSABS 1.7 05/24/2011 0250   MONOABS 1.3* 05/24/2011 0250   EOSABS 0.5 05/24/2011 0250   BASOSABS 0.0 05/24/2011 0250     Comprehensive Metabolic Panel:    Component Value Date/Time   NA 142 06/04/2011 0410   K 3.8 06/04/2011 0410   CL 108 06/04/2011 0410   CO2 26 06/04/2011 0410   BUN 29* 06/04/2011 0410   CREATININE 0.89 06/04/2011 0410   GLUCOSE 275* 06/04/2011 0410   CALCIUM 9.5 06/04/2011 0410   AST 25 06/01/2011 0330   ALT 10 06/01/2011 0330   ALKPHOS 92 06/01/2011 0330   BILITOT 0.6 06/01/2011 0330   PROT 5.6* 06/01/2011 0330   ALBUMIN 2.1* 06/04/2011 0410   ABG   Lab 06/04/11 0410  MG 1.9   Lab Results  Component Value Date   CALCIUM 9.5 06/04/2011   PHOS 3.0 06/04/2011   pCRX-4/12 - BL effusions rt >> lt  Assessment & Plan:  Acute respiratory failure  Extubated 4/12 -has good cough & can maintain airway   Encephalopathy  ?etiol -delerium, Hx of Bipolar on multiple neurogenic meds Taper  Precedex to off over next 24 h, frequent re-orientation etc swallow evaln on Monday -use panda Resume psychotropic meds incl seroquel, lamictal ,monitor qT Resume parkinson's meds - sinemet etc   DM  Resume lantus  Since back on TFs, Cont SSI   Chronic renal insufficiency s/p renal transplant 2002  Renal following.  On Lasix per renal - Cont cellcept, prograf.  - continue free water   Fever/Leukocytosis  -Per ID,Now off all ABX per ID.febrile 4/13 -await rpt cx -CVL changed to PICC Line. 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 Tonny Bollman. [facility] Pager [phone] If no response call [phone]

## 2011-06-04 NOTE — Progress Notes (Signed)
Physical Therapy Evaluation Patient Details Name: John [name] MRN: [id number] DOB: 19-Jul-1940 Today's Date: 06/04/2011  Problem List:  Patient Active Problem List  Diagnoses  . PURE HYPERCHOLESTEROLEMIA  . HYPERLIPIDEMIA-MIXED  . Coronary atherosclerosis of native coronary artery  . CORONARY ATHEROSLERO UNSPEC TYPE BYPASS GRAFT  . PERICARDIAL EFFUSION  . Atrial fibrillation  . Chronic diastolic heart failure  . PVD WITH CLAUDICATION  . INTERMITTENT CLAUDICATION, BILATERAL  . Orthostatic hypotension  . PLEURAL EFFUSION, RIGHT  . LEG PAIN, RIGHT  . Nonspecific abnormal unspecified cardiovascular function study  . Carotid stenosis  . Chronic kidney disease  . COPD (chronic obstructive pulmonary disease)  . Dyspnea  . First degree AV block  . Back pain  . Spondylosis  . Herniated nucleus pulposus  . Diastolic CHF, acute on chronic  . DM (diabetes mellitus), type 2  . Insulin pump in place  . Acute respiratory failure  . Pulmonary edema  . Hypoxemia  . Delirium due to general medical condition  . Dependence on respirator, status    Past Medical History:  Past Medical History  Diagnosis Date  . CAD (coronary artery disease)     a. myoview 1/11: lg inf fixed defect;   b. cath 1/11: 3v CAD,  c. s/p CABG 1/11 c/b acute renal failure and AFib  . Atrial fibrillation   . Chronic diastolic heart failure     a. echo 4/11: EF 45-50%, mod LVH, LAE, inf and septal HK  . First degree AV block   . Orthostatic hypotension     pyridostigmine  . History of renal transplant   . DM2 (diabetes mellitus, type 2)   . Diabetic gastroparesis   . HTN (hypertension)   . HLD (hyperlipidemia)   . PAD (peripheral artery disease)     s/p R SFA-Pop atherectomy 11/11  . Prostate cancer   . ED (erectile dysfunction)   . DDD (degenerative disc disease), lumbar   . Tremor   . Obesity   . Low testosterone   . Chronic kidney disease     s/p renal transplant 2002  . Pleural effusion, right      loculated  . COPD (chronic obstructive pulmonary disease)   . Anxiety   . Diabetes mellitus     insulin pump   Past Surgical History:  Past Surgical History  Procedure Date  . Coronary artery bypass graft 03/2009  . Cataract extraction   . Vitrectomy   . Tonsillectomy   . Kidney transplant 2002  . Hernia repair   . Prostate cancer rx 2006 brachytherapy   . Prior peritoneal catheter placement   . 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  Severe muscular strength deficits are compounded with pulmonary dysfunction to limit ability to perform/sustain an activity.  Currently requires constant physical assistance to perform the most basic of functional tasks.  Has very supportive family who is willing to provide ongoing and around the clock care for him should he be able to perform at a minimal assistance level.  Recommend post acute level of rehab, although it is difficult to predict his tolerance intensity.  Pending progress in the  acute setting, recommend CIR consult to assess for admission, otherwise agree with LTAC as indicated in notes. PT Recommendation/Assessment: Patient will need skilled PT in the acute care venue PT Problem List: Decreased strength;Decreased activity tolerance;Decreased balance;Decreased mobility;Decreased cognition;Decreased knowledge of use of DME;Decreased safety awareness;Decreased knowledge of precautions;Cardiopulmonary status  limiting activity Barriers to Discharge: Inaccessible home environment PT Therapy Diagnosis : Difficulty walking;Generalized weakness PT Plan PT Frequency: Min 3X/week PT Treatment/Interventions: DME instruction;Gait training;Functional mobility training;Therapeutic activities;Therapeutic exercise;Neuromuscular re-education;Balance training;Patient/family education PT Recommendation Recommendations for Other Services: Rehab consult Follow Up Recommendations: Inpatient Rehab;LTACH Equipment Recommended: Defer to next venue PT Goals  Acute Rehab PT Goals PT Goal Formulation: With patient/family Time For Goal Achievement: 2 weeks Pt will Roll Supine to Left Side: with min assist PT Goal: Rolling Supine to Left Side - Progress: Goal set today Pt will go Supine/Side to Sit: with mod assist PT Goal: Supine/Side to Sit - Progress: Goal set today Pt will Sit at Edge of Bed: with supervision;6-10 min;with unilateral upper extremity support PT Goal: Sit at Edge Of Bed - Progress: Goal set today Pt will go Sit to Supine/Side: with min assist;with HOB 0 degrees PT Goal: Sit to Supine/Side - Progress: Goal set today Pt will go Sit to Stand: with max assist PT Goal: Sit to Stand - Progress: Goal set today Pt will go Stand to Sit: with min assist PT Goal: Stand to Sit - Progress: Goal set today Pt will Transfer Bed to Chair/Chair to Bed: with mod assist PT Transfer Goal: Bed to Chair/Chair to Bed - Progress: Goal set today Pt will Stand: with bilateral upper extremity support;with mod assist;3 - 5 min PT Goal: Stand - Progress: Goal set today Pt will Ambulate: 1 - 15 feet;with max assist;with rolling walker PT Goal: Ambulate - Progress: Goal set today  PT Evaluation Precautions/Restrictions  Precautions Precautions: Back;Fall Precaution Booklet Issued: No Precaution Comments: requires max cues to maintain back precautions. Restrictions Weight Bearing Restrictions: No Other Position/Activity  Restrictions: no bend arch twist per back precautions Prior Functioning  Home Living Lives With: Spouse Available Help at Discharge: Family Type of Home: House Home Access: Stairs to enter [employer] of Steps: 3 Entrance Stairs-Rails: Right Home Layout: Multi-level;Able to live on main level with bedroom/bathroom Alternate Level Stairs-Number of Steps: 12 Prior Function Level of Independence: Independent Able to Take Stairs?: Yes Cognition Cognition Arousal/Alertness: Awake/alert Overall Cognitive Status: Impaired Orientation Level: Disoriented to place;Disoriented to time;Disoriented to situation Sensation/Coordination Sensation Light Touch Impaired Details: Impaired RLE;Impaired LLE Extremity Assessment RLE Assessment RLE Assessment: Exceptions to [facility] RLE Strength RLE Overall Strength: Deficits RLE Overall Strength Comments: unable to actively flex/extend with/without gravity eliminated; cannot load-bear or extend knees into standing even with physical assist LLE Assessment LLE Assessment: Exceptions to [facility] LLE Strength LLE Overall Strength: Deficits LLE Overall Strength Comments: see RLE comments... Mobility (including Balance) Bed Mobility Bed Mobility: Yes Rolling Right: 1: +2 Total assist;With rail;Patient percentage (comment) (30%) Rolling Right Details (indicate cue type and reason): nurse and PT used pad to roll patient in flat bed, requires bil LE to be passively repositioned to faciliate roll, and max cues to use rails to assist with trunk rotation, and cannot hold sidelying position with physical assist. Rolling Left: 1: +2 Total assist;Patient percentage (comment);With rail Rolling Left Details (indicate cue type and reason): as with right Left Sidelying to Sit: 1: +2 Total assist;Patient percentage (comment);HOB elevated (comment degrees) (30%; bed pad to assist hips to EOB) Left Sidelying to Sit  Details (indicate cue type and reason): head of bed in  70% elevation; requires max assist to clear feet from foot of bed to side, and cannot actively scoot hips or support trunk to EOB; Sitting - Scoot to Edge of Bed: 1: +2 Total assist Sitting - Scoot to Edge of Bed Details (indicate cue type and reason): as above; with bed pad and trunk actively supported throughtout.  Pt does attempt to support self with UE Transfers Transfers: Yes Sit to Stand: Patient percentage (comment);1: +1 Total assist;From elevated surface;With upper extremity assist;From bed (35%) Sit to Stand Details (indicate cue type and reason): braced right knee; pt reports feet both completely numb and prevents somatosensory awareness of floor;  front guard technique to facilitate hip anterior/superior translation; able to acheive partial stand x3 reps, requires physical support throughout activity with minimal to negligible load-bearing activity in legs.  Pt incontient of stool, RN attempted to clean peri-area during. Stand to Sit: 1: +1 Total assist Ambulation/Gait Ambulation/Gait: No Stairs: No Wheelchair Mobility Wheelchair Mobility: No  Posture/Postural Control Posture/Postural Control: Postural limitations Postural Limitations: severely weakened trunk extensors limited pt's ability to support trunk independently, resulting in forward flexed posture and constant need for physical assistance to prevent fall to bed or floor even from sitting position. Balance Balance Assessed: Yes Static Sitting Balance Static Sitting - Balance Support: Bilateral upper extremity supported;Feet supported Static Sitting - Level of Assistance: 2: Max assist Static Sitting - Comment/# of Minutes: 15 (total over course of eval) Exercise    End of Session PT - End of Session Equipment Utilized During Treatment: Gait belt Activity Tolerance: Patient limited by fatigue Patient left: in bed;with call bell in reach;with family/visitor present Nurse Communication: Mobility status for  transfers General Behavior During Session: [facility] for tasks performed Cognition: [facility] for tasks performed Cognitive Impairment: improved cognition compared to reports from previous day; able to attend to all activites and follow commands; tends to ramble a bit, tell stories, not always on topic, but cooperative and reasonable Patient Class: Inpatient Dennis Bast 06/04/2011, 1:39 PM

## 2011-06-04 NOTE — Progress Notes (Addendum)
[facility]  Subjective:  Much more alert and coherent this AM.  Wife and daughter at bedside   Objective: Vital signs in last 24 hours: Blood pressure 131/49, pulse 75, temperature 98.1 F (36.7 C), temperature source Oral, resp. rate 31, height 6\' 2"  (1.88 m), weight 98.2 kg (216 lb 7.9 oz), SpO2 98.00%.    PHYSICAL EXAM General--as above Chest--rhonchi Heart--no rub Abd--nontender, allograft in RLQ Extr--tr edema  Lab Results:   Lab 06/04/11 0410 06/03/11 0830 06/03/11 0320  NA 142 146* 139  K 3.8 3.6 3.8  CL 108 110 104  CO2 26 26 27   BUN 29* 26* 26*  CREATININE 0.89 0.96 0.94  ALB -- -- --  GLUCOSE 275* -- --  CALCIUM 9.5 9.4 9.4  PHOS 3.0 2.1* 2.2*     Basename 06/04/11 0410 06/03/11 0830  WBC 7.7 10.3  HGB 7.9* 8.1*  HCT 25.3* 26.2*  PLT 146* 230   Mg 1.9 today   Assessment/Plan: 1. S/P renal transplant--renal fct ok on cell cept, prograf, and steroids. prograf level 5.5 on 8 Apr (good). Cell cept, steroids, prograf today via panda. 2. Anemia--HGB 7.9, Fe/TIBC 12 % sat, ferritin 649--on IV Fe. Type and screen sent to lab in case PRBC needed.  No aranesp  3. VDRF--now extubated X 48 hr. R pl eff (tapped this week), bibasilar atelectasis/infiltr, plus less pulm vasc. congestion. CVP 8 earlier today. Weight has changed from 94.7 kg on 5 Apr to 101.3 kg on 12 Apr to 100 kg 13 Apr to 98.2 kg today. Wt was 100.5 kg in my office Jan 2013. I suspect he's still vol expanded Still on furosemide  80 IV BID. Will give furosemide 80 mg via panda x 3 doses (last dose Mon AM).  Supplemental K via FT (40 BID x 3 doses) today. Check K and Mg in AM  4. Hypernatremia-better. D/c free water via FT.  5. DM--off pump, on SSI --glu low this AM--receiving D10W 20/hr--with FT in and CBGs 198-316--will d/c D10W 6. 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 S/P laminectomy 26 Mar--per neurosurg.  8. Fever (T101.1 36 hr ago) culture blood and urine sent.   On no Ab yet 9.  Agitation--he was on numerous psych meds PTA and some have been restarted.  I think it would be a good idea to ask Psych to see (he sees Dr. Jennelle Human as outpatient)    LOS: 19 days   John [name] F 06/04/2011,10:25 AM   .labalb

## 2011-06-04 NOTE — Progress Notes (Signed)
Patient ID: John [name], male   DOB: 25-Mar-1940, 71 y.o.   MRN: [id number]    Subjective:  Extubated no trach    Objective:  Vital Signs in the last 24 hours: Temp:  [97.5 F (36.4 C)-99.8 F (37.7 C)] 97.5 F (36.4 C) (04/14 0400) Pulse Rate:  [60-95] 75  (04/14 0800) Resp:  [20-39] 31  (04/14 0800) BP: (108-149)/(45-77) 131/49 mmHg (04/14 0800) SpO2:  [94 %-100 %] 98 % (04/14 0800) Weight:  [216 lb 7.9 oz (98.2 kg)] 216 lb 7.9 oz (98.2 kg) (04/14 0400)  Intake/Output from previous day: 04/13 0701 - 04/14 0700 In: 1588.9 [I.V.:1078.9; NG/GT:110; IV Piggyback:400] Out: 2400 [Urine:2400]  Physical Exam: Extubated more responsive HEENT: normal Neck: JVP - normal Lungs: clear anteriorly CV: RRR without murmur or gallop Abd: soft, NT, Positive BS, no hepatomegaly Ext: mild diffuse edema Skin: warm/dry no rash  Lab Results:  Basename 06/04/11 0410 06/03/11 0830  WBC 7.7 10.3  HGB 7.9* 8.1*  PLT 146* 230    Basename 06/04/11 0410 06/03/11 0830  NA 142 146*  K 3.8 3.6  CL 108 110  CO2 26 26  GLUCOSE 275* 197*  BUN 29* 26*  CREATININE 0.89 0.96   No results found for this basename: TROPONINI:2,CK,MB:2 in the last 72 hours  Tele: sinus brady heart rate 50's  Assessment/Plan:  1. Acute on chronic diastolic CHF 2. Respiratory failure 3. CKD s/p renal transplant  Cardiac status stable. Nice that trach not needed.  Will sign off  Charlton Haws, M.D. 06/04/2011, 8:27 AM

## 2011-06-04 NOTE — Progress Notes (Signed)
Patient ID: John [name], male   DOB: 07/26/1940, 71 y.o.   MRN: [id number] Subjective:  The patient is alert and pleasant.  Objective: Vital signs in last 24 hours: Temp:  [97.5 F (36.4 C)-99.8 F (37.7 C)] 98.1 F (36.7 C) (04/14 0800) Pulse Rate:  [60-89] 75  (04/14 0800) Resp:  [20-39] 31  (04/14 0800) BP: (108-149)/(45-77) 131/49 mmHg (04/14 0800) SpO2:  [96 %-100 %] 98 % (04/14 0800) Weight:  [98.2 kg (216 lb 7.9 oz)] 98.2 kg (216 lb 7.9 oz) (04/14 0400)  Intake/Output from previous day: 04/13 0701 - 04/14 0700 In: 1588.9 [I.V.:1078.9; NG/GT:110; IV Piggyback:400] Out: 2400 [Urine:2400] Intake/Output this shift: Total I/O In: 60 [I.V.:60] Out: 350 [Urine:350]  Physical exam the patient is alert and pleasant. He continues to have good gastrocnemius strength bilaterally.  Lab Results:  Basename 06/04/11 0410 06/03/11 0830  WBC 7.7 10.3  HGB 7.9* 8.1*  HCT 25.3* 26.2*  PLT 146* 230   BMET  Basename 06/04/11 0410 06/03/11 0830  NA 142 146*  K 3.8 3.6  CL 108 110  CO2 26 26  GLUCOSE 275* 197*  BUN 29* 26*  CREATININE 0.89 0.96  CALCIUM 9.5 9.4    Studies/Results: Dg Abd Portable 1v  06/03/2011  *RADIOLOGY REPORT*  Clinical Data: Feeding tube placement.  PORTABLE ABDOMEN - 1 VIEW  Comparison: CT abdomen and pelvis without contrast 05/30/2011.  Findings: The tip of a small bore feeding tube is in the distal stomach.  Bibasilar airspace disease and effusions are noted.  IMPRESSION:  1.  The tip of a small bore feeding tube is in the distal stomach. 2.  Bibasilar airspace disease and effusions are similar to the prior exam.  Infection is not excluded.  Original Report Authenticated By: Jamesetta Orleans. MATTERN, M.D.    Assessment/Plan: The patient is neurologically stable.  LOS: 19 days     Bilal Manzer D 06/04/2011, 10:03 AM

## 2011-06-04 NOTE — Plan of Care (Signed)
Problem: Phase III Progression Outcomes Goal: Activity at appropriate level-compared to baseline (UP IN CHAIR FOR HEMODIALYSIS)  Outcome: Progressing Re-started PT services this date (4/14)

## 2011-06-05 DIAGNOSIS — G931 Anoxic brain damage, not elsewhere classified: Secondary | ICD-10-CM

## 2011-06-05 DIAGNOSIS — J9600 Acute respiratory failure, unspecified whether with hypoxia or hypercapnia: Secondary | ICD-10-CM

## 2011-06-05 DIAGNOSIS — IMO0002 Reserved for concepts with insufficient information to code with codable children: Secondary | ICD-10-CM

## 2011-06-05 LAB — BODY FLUID CULTURE

## 2011-06-05 LAB — CBC
MCV: 68.9 fL — ABNORMAL LOW (ref 78.0–100.0)
Platelets: 197 10*3/uL (ref 150–400)
RDW: 19.8 % — ABNORMAL HIGH (ref 11.5–15.5)
WBC: 11.5 10*3/uL — ABNORMAL HIGH (ref 4.0–10.5)

## 2011-06-05 LAB — GLUCOSE, CAPILLARY
Glucose-Capillary: 161 mg/dL — ABNORMAL HIGH (ref 70–99)
Glucose-Capillary: 243 mg/dL — ABNORMAL HIGH (ref 70–99)
Glucose-Capillary: 260 mg/dL — ABNORMAL HIGH (ref 70–99)

## 2011-06-05 LAB — RENAL FUNCTION PANEL
Calcium: 9.2 mg/dL (ref 8.4–10.5)
GFR calc Af Amer: 83 mL/min — ABNORMAL LOW (ref >90)
Glucose, Bld: 192 mg/dL — ABNORMAL HIGH (ref 70–99)
Phosphorus: 1.8 mg/dL — ABNORMAL LOW (ref 2.3–4.6)
Sodium: 143 mEq/L (ref 135–145)

## 2011-06-05 MED ORDER — INSULIN GLARGINE 100 UNIT/ML ~~LOC~~ SOLN
15.0000 [IU] | Freq: Every day | SUBCUTANEOUS | Status: DC
  Administered 2011-06-06 – 2011-06-08 (×3): 15 [IU] via SUBCUTANEOUS

## 2011-06-05 MED ORDER — FOOD THICKENER (THICKENUP CLEAR)
1.0000 | ORAL | Status: DC | PRN
  Filled 2011-06-05: qty 120

## 2011-06-05 MED ORDER — FERUMOXYTOL INJECTION 510 MG/17 ML
510.0000 mg | Freq: Once | INTRAVENOUS | Status: AC
  Administered 2011-06-05: 510 mg via INTRAVENOUS
  Filled 2011-06-05: qty 17

## 2011-06-05 NOTE — Progress Notes (Signed)
HPI: 71 yo male with hx COPD, renal transplant (2002), DM on insulin pump who presented 3/26 for lumbar laminotomies and decompression L5 and S1 r/t spondylosis and herniated nucleus pulposus. Post op developed episodes of confusion and respiratory distress thought r/t CHF Which initially improved with lasix. On 3/31 developed worsening respiratory distress and hypoxia and PCCM consulted.   Antibiotics: (per ID) Vanc 3/31 >> 4/10 Zosyn 3/31 >> 4/10 micofungin 4/7 >> 4/10  Cultures/Sepsis Markers:   Urine 3/29>>> MORGANELLA MORGANII (6,000 COLONIES/ML)  BCx2 3/28>>> negative  Sputum 3/31>>>neg  Resp 4/3 >> rare candida Blood 4/3 >> NEG Blood 4/5 >> NEG Blood 4/13 >>ng Urine 4/13 >>ng   Access/Protocols:  L IJ CVL 3/31 >> 4/12 ET tube 4/3 >> 4/12 LUE PICC 4/12 >>>  Best Practice:  DVT: SCDs GI: Pantoprazole   STUDIES: CT CHEST 05/27/11 Small left pleural effusion. Moderate sized right pleural effusion. Bilateral lower lobe posterior atelectasis or infiltrate. Mild emphysematous changes bilateral upper lobe Thoracentesis 4/11: 400 cc removed by IR.  Borderline exudate by chemistries  Subjective: RASS 0 to +1. Confused & agitated  Overnight requiring restarting precedex   Physical Exam: Filed Vitals:   06/05/11 0900  BP: 123/48  Pulse: 72  Temp:   Resp: 33    Intake/Output Summary (Last 24 hours) at 06/05/11 1023 Last data filed at 06/05/11 1000  Gross per 24 hour  Intake 1186.56 ml  Output   1225 ml  Net -38.44 ml      General: chronically ill appearing male, Intermittently agitated. + F/C. Neuro: No focal deficits, follows commands , more lucid, mobilising with PT CV: RRR s M.  PULM: Slightly coarse. No wheezes GI: abd soft, +bs.  Extremities: Warm and dry, trace BLE edema.   Labs: CBC:    Component Value Date/Time   WBC 11.5* 06/05/2011 0315   HGB 7.9* 06/05/2011 0315   HCT 25.7* 06/05/2011 0315   PLT 197 06/05/2011 0315   MCV 68.9* 06/05/2011 0315   NEUTROABS 9.1* 05/24/2011 0250   LYMPHSABS 1.7 05/24/2011 0250   MONOABS 1.3* 05/24/2011 0250   EOSABS 0.5 05/24/2011 0250   BASOSABS 0.0 05/24/2011 0250     Comprehensive Metabolic Panel:    Component Value Date/Time   NA 143 06/05/2011 0315   K 4.2 06/05/2011 0315   CL 111 06/05/2011 0315   CO2 24 06/05/2011 0315   BUN 27* 06/05/2011 0315   CREATININE 1.02 06/05/2011 0315   GLUCOSE 192* 06/05/2011 0315   CALCIUM 9.2 06/05/2011 0315   AST 25 06/01/2011 0330   ALT 10 06/01/2011 0330   ALKPHOS 92 06/01/2011 0330   BILITOT 0.6 06/01/2011 0330   PROT 5.6* 06/01/2011 0330   ALBUMIN 2.0* 06/05/2011 0315   ABG   Lab 06/05/11 0315  MG 1.7   Lab Results  Component Value Date   CALCIUM 9.2 06/05/2011   PHOS 1.8* 06/05/2011   pCRX-4/12 - BL effusions rt >> lt  Assessment & Plan:  Acute respiratory failure  Extubated 4/12 -has good cough & can maintain airway   Encephalopathy  ?etiol -delerium, Hx of Bipolar on multiple neurogenic meds, depression Taper  Precedex to off , frequent re-orientation etc FEES on 4/16 -use panda, advance PO Resume psychotropic meds incl seroquel, lamictal ,monitor qT, hold risperdal (parkinson's) Resume parkinson's meds - sinemet etc Psych input   DM  Increase lantus to 15 u,  Since back on TFs, Cont SSI   Chronic renal insufficiency s/p renal transplant 2002  Renal  following. On Lasix per renal - Cont cellcept, prograf.  - continue free water   Fever/Leukocytosis  -Per ID,Now off all ABX per ID.febrile 4/13 -CVL changed to PICC Line. 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 Tonny Bollman. [facility] Pager [phone] If no response call [phone]

## 2011-06-05 NOTE — Progress Notes (Signed)
Glycemic Control Recommendations   Patient has history of insulin pump.  Lantus 10 units given on 4/14 at 10 am.   Glucose as follows:  Results for John [name], John [name] (MRN [id number]) as of 06/05/2011 07:56  Ref. Range 06/04/2011 07:47 06/04/2011 12:21 06/04/2011 15:54 06/04/2011 20:23 06/04/2011 23:55 06/05/2011 03:18  Glucose-Capillary Latest Range: 70-99 mg/dL 562 (H) 130 (H) 865 (H) 171 (H) 223 (H) 165 (H)    Recommendations:    Consider increasing Lantus to 12 unitsm daily

## 2011-06-05 NOTE — Progress Notes (Signed)
Pt had 2nd stool medium size brown with another blood clot noted.  Elink made aware VS stable and will continue to monitor.  Will draw lab work early.  John [name]

## 2011-06-05 NOTE — Progress Notes (Signed)
Pt had medium loose stool brown/bloody with a medium blood clot noted.  Pt known to have blood clots before and does have  Hemmroids. Vs stable continue to watch.John [name]

## 2011-06-05 NOTE — Progress Notes (Signed)
Patient ID: John [name], male   DOB: [birth year], 71 y.o.   MRN: [id number] S:extubated, no new complaints O:BP 123/48  Pulse 72  Temp(Src) 97.9 F (36.6 C) (Oral)  Resp 33  Ht 6\' 2"  (1.88 m)  Wt 96.5 kg (212 lb 11.9 oz)  BMI 27.31 kg/m2  SpO2 97%  Intake/Output Summary (Last 24 hours) at 06/05/11 1027 Last data filed at 06/05/11 1000  Gross per 24 hour  Intake 1186.56 ml  Output   1225 ml  Net -38.44 ml   Weight change: -1.7 kg (-3 lb 12 oz) Gen:WD WN WM, lethargic but in NAD CVS:RRR no rub  Resp:decreased BS at bases WUJ:[id number] Ext:no edema   Lab 06/05/11 0315 06/04/11 0410 06/03/11 0830 06/03/11 0320 06/02/11 0334 06/01/11 0330 05/31/11 0436  NA 143 142 146* 139 144 145 145  K 4.2 3.8 3.6 3.8 3.5 3.7 3.7  CL 111 108 110 104 109 111 108  CO2 24 26 26 27 29 29 31   GLUCOSE 192* 275* 197* 319* 155* 217* 256*  BUN 27* 29* 26* 26* 30* 30* 38*  CREATININE 1.02 0.89 0.96 0.94 0.84 0.90 1.06  ALB -- -- -- -- -- -- --  CALCIUM 9.2 9.5 9.4 9.4 9.3 9.3 9.6  PHOS 1.8* 3.0 2.1* 2.2* 3.0 2.7 2.4  AST -- -- -- -- -- 25 --  ALT -- -- -- -- -- 10 --   Liver Function Tests:  Lab 06/05/11 0315 06/04/11 0410 06/03/11 0830 06/01/11 0330  AST -- -- -- 25  ALT -- -- -- 10  ALKPHOS -- -- -- 92  BILITOT -- -- -- 0.6  PROT -- -- -- 5.6*  ALBUMIN 2.0* 2.1* 2.0* --   No results found for this basename: LIPASE:3,AMYLASE:3 in the last 168 hours No results found for this basename: AMMONIA:3 in the last 168 hours CBC:  Lab 06/05/11 0315 06/04/11 0410 06/03/11 0830 06/03/11 0320 06/02/11 0334  WBC 11.5* 7.7 10.3 -- --  NEUTROABS -- -- -- -- --  HGB 7.9* 7.9* 8.1* -- --  HCT 25.7* 25.3* 26.2* -- --  MCV 68.9* 70.1* 69.9* 69.7* 70.5*  PLT 197 146* 230 -- --   Cardiac Enzymes: No results found for this basename: CKTOTAL:5,CKMB:5,CKMBINDEX:5,TROPONINI:5 in the last 168 hours CBG:  Lab 06/05/11 0749 06/05/11 0318 06/04/11 2355 06/04/11 2023 06/04/11 1554  GLUCAP 202* 165* 223* 171*  174*    Iron Studies: No results found for this basename: IRON,TIBC,TRANSFERRIN,FERRITIN in the last 72 hours Studies/Results: Dg Abd Portable 1v  06/03/2011  *RADIOLOGY REPORT*  Clinical Data: Feeding tube placement.  PORTABLE ABDOMEN - 1 VIEW  Comparison: CT abdomen and pelvis without contrast 05/30/2011.  Findings: The tip of a small bore feeding tube is in the distal stomach.  Bibasilar airspace disease and effusions are noted.  IMPRESSION:  1.  The tip of a small bore feeding tube is in the distal stomach. 2.  Bibasilar airspace disease and effusions are similar to the prior exam.  Infection is not excluded.  Original Report Authenticated By: Jamesetta Orleans. MATTERN, M.D.      . albuterol  2.5 mg Nebulization Q6H  . antiseptic oral rinse  1 application Mouth Rinse QID  . aspirin  81 mg Per Tube Daily  . calcium carbonate  2 tablet Per Tube Daily  . carbidopa-levodopa  1 tablet Per Tube TID  . fentaNYL  50 mcg Transdermal Q72H  . ferumoxytol  510 mg Intravenous Once  . furosemide  80  mg Oral BID  . insulin aspart  0-15 Units Subcutaneous Q4H  . insulin glargine  15 Units Subcutaneous Daily  . ipratropium  0.5 mg Nebulization Q6H  . lamoTRIgine  200 mg Oral Daily  . mycophenolate  500 mg Oral BID  . pantoprazole sodium  40 mg Per Tube BID  . potassium chloride  40 mEq Oral BID  . predniSONE  5 mg Oral Q breakfast  . pyridostigmine  30 mg Oral TID  . QUEtiapine  100 mg Oral QHS  . sodium chloride  10-40 mL Intracatheter Q12H  . tacrolimus  5 mg Oral BID  . Vilazodone HCl  20 mg Oral Daily  . DISCONTD: chlorhexidine  15 mL Mouth/Throat BID  . DISCONTD: furosemide  80 mg Intravenous Q12H  . DISCONTD: insulin glargine  10 Units Subcutaneous Daily  . DISCONTD: mycophenolate  500 mg Oral BID  . DISCONTD: mycophenolate  500 mg Oral BID  . DISCONTD: pantoprazole  40 mg Oral BID    BMET    Component Value Date/Time   NA 143 06/05/2011 0315   K 4.2 06/05/2011 0315   CL 111 06/05/2011  0315   CO2 24 06/05/2011 0315   GLUCOSE 192* 06/05/2011 0315   BUN 27* 06/05/2011 0315   CREATININE 1.02 06/05/2011 0315   CALCIUM 9.2 06/05/2011 0315   GFRNONAA 72* 06/05/2011 0315   GFRAA 83* 06/05/2011 0315   CBC    Component Value Date/Time   WBC 11.5* 06/05/2011 0315   RBC 3.73* 06/05/2011 0315   HGB 7.9* 06/05/2011 0315   HCT 25.7* 06/05/2011 0315   PLT 197 06/05/2011 0315   MCV 68.9* 06/05/2011 0315   MCH 21.2* 06/05/2011 0315   MCHC 30.7 06/05/2011 0315   RDW 19.8* 06/05/2011 0315   LYMPHSABS 1.7 05/24/2011 0250   MONOABS 1.3* 05/24/2011 0250   EOSABS 0.5 05/24/2011 0250   BASOSABS 0.0 05/24/2011 0250     Assessment/Plan:  1. Renal transplant- good allograft function s/p LRD July 2002 at [facility].  Cont with meds 2. VDRF- extubated ?etiol of pleural effusions 3. ABLA- transfuse as needed.  Baseline Hgb was 11.3 prior to surgery on 05/16/11.  Follow 4. Bipolar disorder- resume meds and consider psych eval 5. htn- stable 6. DJD s/p lumbar surgery 7. Deconditioning- PT/OT   Mandell Pangborn A

## 2011-06-05 NOTE — Progress Notes (Signed)
Pt had small BM no blood or clots noted.  VS stable will continue to monitor.John [name] [place]

## 2011-06-05 NOTE — Consult Note (Signed)
Physical Medicine and Rehabilitation Consult Reason for Consult: Deconditioning/lumbar radiculopathy Referring Phsyician: Dr. Shan [name] John [name] is an 71 y.o. male.   HPI: 71 year old right-handed male with history of coronary artery disease as well as atrial fibrillation, and diabetes mellitus with insulin pump, renal transplant 2002 creatinine baseline 1.3 and COPD admitted March 26 with radiating low back pain. X-rays and imaging revealed the spondylosis and herniated nuclear pulposus L5-S1 with radiculopathy. Underwent bilateral laminotomies and foraminotomies decompression L5 and S1 March 26 per Dr. Danielle Dess. Postoperative confusion and respiratory distress secondary to congestive heart failure and initially placed on Lasix therapy. On March 31 developed worsening respiratory distress and hypoxia with critical care medicine consulted and was intubated. Patient spiked a fever to 100.7 placed on empiric antibiotics. Blood cultures 3/28 negative. Patient was later extubated and monitored closely. Patient did undergo right thoracentesis April 11 for pleural effusion with 400 cc removed by interventional radiology. Patient bouts of loose stools with a flexiseal placed. Presently on dysphagia 1 honey thick liquid diet per speech therapy with nasogastric tube for supplemental nutrition. Confusion has improved but still ongoing suspect hypoxic encephalopathy. Patient with profound deconditioning. M.D. as well as physical therapy as requested physical medicine rehabilitation consult to consider inpatient rehabilitation services versus LTAC.  Review of Systems  Constitutional: Positive for diaphoresis.  Respiratory: Positive for cough and shortness of breath.   Cardiovascular: Positive for orthopnea.  Musculoskeletal: Positive for myalgias.  Neurological: Positive for tremors.  All other systems reviewed and are negative.   Past Medical History  Diagnosis Date  . CAD (coronary artery disease)      a. myoview 1/11: lg inf fixed defect;   b. cath 1/11: 3v CAD,  c. s/p CABG 1/11 c/b acute renal failure and AFib  . Atrial fibrillation   . Chronic diastolic heart failure     a. echo 4/11: EF 45-50%, mod LVH, LAE, inf and septal HK  . First degree AV block   . Orthostatic hypotension     pyridostigmine  . History of renal transplant   . DM2 (diabetes mellitus, type 2)   . Diabetic gastroparesis   . HTN (hypertension)   . HLD (hyperlipidemia)   . PAD (peripheral artery disease)     s/p R SFA-Pop atherectomy 11/11  . Prostate cancer   . ED (erectile dysfunction)   . DDD (degenerative disc disease), lumbar   . Tremor   . Obesity   . Low testosterone   . Chronic kidney disease     s/p renal transplant 2002  . Pleural effusion, right     loculated  . COPD (chronic obstructive pulmonary disease)   . Anxiety   . Diabetes mellitus     insulin pump   Past Surgical History  Procedure Date  . Coronary artery bypass graft 03/2009  . Cataract extraction   . Vitrectomy   . Tonsillectomy   . Kidney transplant 2002  . Hernia repair   . Prostate cancer rx 2006 brachytherapy   . Prior peritoneal catheter placement   . Lumbar laminectomy/decompression microdiscectomy 05/16/2011    Procedure: LUMBAR LAMINECTOMY/DECOMPRESSION MICRODISCECTOMY 1 LEVEL;  Surgeon: Barnett Abu, MD;  Location: [facility];  Service: Neurosurgery;  Laterality: Bilateral;  Bilateral Lumbar Five-Sacral One Laminectomy   Family History  Problem Relation Age of Onset  . Coronary artery disease Mother   . Valvular heart disease Father   . Hepatitis      sibling  . Coronary artery disease Sister   .  Heart attack Sister   . Tuberculosis Father    Social History:  reports that he quit smoking about 33 years ago. His smoking use included Cigarettes. He has a 40 pack-year smoking history. He has never used smokeless tobacco. He reports that he does not drink alcohol or use illicit drugs. Allergies: No Known  Allergies Medications Prior to Admission  Medication Dose Route Frequency Provider Last Rate Last Dose  . 0.45 % sodium chloride infusion   Intravenous Continuous Oretha Milch, MD 20 mL/hr at 06/04/11 1435    . 0.45 % sodium chloride infusion   Intravenous Continuous Alyson Reedy, MD 10 mL/hr at 06/03/11 2000 10 mL at 06/03/11 2000  . acetaminophen (TYLENOL) solution 650 mg  650 mg Per Tube Q4H PRN Mariea Stable, MD   650 mg at 06/01/11 0114  . albuterol (PROVENTIL) (5 MG/ML) 0.5% nebulizer solution 2.5 mg  2.5 mg Nebulization Q6H Merwyn Katos, MD   2.5 mg at 06/05/11 4098  . albuterol (PROVENTIL) (5 MG/ML) 0.5% nebulizer solution 2.5 mg  2.5 mg Nebulization Q3H PRN Merwyn Katos, MD      . antiseptic oral rinse (BIOTENE) solution 15 mL  1 application Mouth Rinse QID Lonia Farber, MD   15 mL at 06/05/11 1227  . aspirin chewable tablet 81 mg  81 mg Per Tube Daily Mariea Stable, MD   81 mg at 06/05/11 0926  . bacitracin 11914 UNITS injection           . calcium carbonate (OS-CAL - dosed in mg of elemental calcium) tablet 1,000 mg of elemental calcium  2 tablet Per Tube Daily Storm Frisk, MD   1,000 mg of elemental calcium at 06/05/11 0926  . carbidopa-levodopa (SINEMET) 25-100 MG per tablet 1 tablet  1 tablet Per Tube TID Storm Frisk, MD   1 tablet at 06/05/11 0925  . ceFAZolin (ANCEF) IVPB 2 g/50 mL premix  2 g Intravenous 60 min Pre-Op Barnett Abu, MD   2 g at 05/16/11 7829  . dexmedetomidine (PRECEDEX) 200 mcg in sodium chloride 0.9 % 50 mL infusion  0.2-1.2 mcg/kg/hr Intravenous Continuous Storm Frisk, MD 24.4 mL/hr at 05/31/11 0740 1 mcg/kg/hr at 05/31/11 0740  . dexmedetomidine (PRECEDEX) 200 mcg in sodium chloride 0.9 % 50 mL infusion  0.2-0.8 mcg/kg/hr Intravenous Continuous Oretha Milch, MD 12.5 mL/hr at 06/04/11 0451 0.5 mcg/kg/hr at 06/04/11 0451  . etomidate (AMIDATE) 2 MG/ML injection        20 mg at 05/24/11 0501  . feeding supplement (JEVITY  1.2 CAL) liquid 1,000 mL  1,000 mL Per Tube Continuous Oretha Milch, MD 20 mL/hr at 06/04/11 1104 1,000 mL at 06/04/11 1104  . fentaNYL (DURAGESIC - dosed mcg/hr) 50 mcg  50 mcg Transdermal Q72H Merwyn Katos, MD   50 mcg at 06/04/11 0947  . fentaNYL (SUBLIMAZE) injection 25-50 mcg  25-50 mcg Intravenous Q2H PRN Cristi Loron, MD   50 mcg at 06/04/11 2317  . ferumoxytol [facility]) injection 510 mg  510 mg Intravenous Once Zada Girt, MD   510 mg at 05/31/11 1155  . ferumoxytol [facility]) injection 510 mg  510 mg Intravenous Once Zada Girt, MD   510 mg at 06/04/11 1135  . ferumoxytol [facility]) injection 510 mg  510 mg Intravenous Once Irena Cords, MD   510 mg at 06/05/11 1227  . food thickener (RESOURCE THICKENUP CLEAR) powder 120 g  1 Can Oral PRN Comer Locket  Vassie Loll, MD      . furosemide (LASIX) 8 MG/ML solution 80 mg  80 mg Oral BID Zada Girt, MD   80 mg at 06/05/11 0924  . furosemide (LASIX) injection 80 mg  80 mg Intravenous Q12H Zada Girt, MD   80 mg at 06/02/11 1053  . haloperidol lactate (HALDOL) injection 5 mg  5 mg Intravenous Once Zigmund Gottron, MD   5 mg at 06/02/11 2209   And  . haloperidol lactate (HALDOL) injection 10 mg  10 mg Intravenous Q30 min PRN Zigmund Gottron, MD   10 mg at 06/03/11 0126  . haloperidol lactate (HALDOL) injection 5 mg  5 mg Intramuscular Once Lupita Leash, MD   5 mg at 05/23/11 0045  . haloperidol lactate (HALDOL) injection 5 mg  5 mg Intramuscular Once Lupita Leash, MD   5 mg at 05/23/11 0156  . hydrALAZINE (APRESOLINE) injection 10 mg  10 mg Intravenous Q6H PRN Darnelle Maffucci, MD   10 mg at 06/01/11 0114  . hydrocortisone cream 1 %   Topical TID PRN Darnelle Maffucci, MD      . HYDROmorphone (DILAUDID) 1 MG/ML injection           . HYDROmorphone (DILAUDID) 1 MG/ML injection           . insulin aspart (novoLOG) injection 0-15 Units  0-15 Units Subcutaneous Q4H Merwyn Katos, MD   5 Units at 06/05/11 1227  .  insulin glargine (LANTUS) injection 12 Units  12 Units Subcutaneous Once Nolon Lennert, MD   12 Units at 05/27/11 1827  . insulin glargine (LANTUS) injection 15 Units  15 Units Subcutaneous Daily Oretha Milch, MD      . iohexol (OMNIPAQUE) 300 MG/ML solution 20 mL  20 mL Oral Q1 Hr x 2 Medication Radiologist, MD   20 mL at 05/30/11 1130  . ipratropium (ATROVENT) nebulizer solution 0.5 mg  0.5 mg Nebulization Q6H Merwyn Katos, MD   0.5 mg at 06/05/11 0823  . lamoTRIgine (LAMICTAL) tablet 200 mg  200 mg Oral Daily Barnett Abu, MD   200 mg at 06/05/11 0925  . lidocaine (cardiac) 100 mg/54ml (XYLOCAINE) 20 MG/ML injection 2%           . LORazepam (ATIVAN) injection 1 mg  1 mg Intravenous Once Zigmund Gottron, MD   1 mg at 06/02/11 2200  . mycophenolate (CELLCEPT) 200 MG/ML suspension 500 mg  500 mg Oral BID Storm Frisk, MD   500 mg at 06/05/11 4098  . ondansetron (ZOFRAN) injection 4 mg  4 mg Intravenous Q4H PRN Barnett Abu, MD   4 mg at 06/02/11 2010  . pantoprazole sodium (PROTONIX) 40 mg/20 mL oral suspension 40 mg  40 mg Per Tube BID Oretha Milch, MD   40 mg at 06/05/11 0924  . potassium chloride 10 mEq in 100 mL IVPB  10 mEq Intravenous Q1 Hr x 4 Lupita Leash, MD   10 mEq at 05/24/11 0941  . potassium chloride 10 mEq in 50 mL *CENTRAL LINE* IVPB  10 mEq Intravenous Q1 Hr x 3 Hessie Diener [place], PHARMD   10 mEq at 05/24/11 1511  . potassium chloride 10 mEq in 50 mL *CENTRAL LINE* IVPB  10 mEq Intravenous Q1 Hr x 6 Zada Girt, MD   10 mEq at 06/02/11 1755  . potassium chloride 10 mEq in 50 mL *CENTRAL LINE* IVPB  10 mEq Intravenous Q1 Hr  x 6 Zada Girt, MD   10 mEq at 06/03/11 1834  . potassium chloride 10 mEq in 50 mL *CENTRAL LINE* IVPB  10 mEq Intravenous Q1 Hr x 2 Storm Frisk, MD   10 mEq at 06/03/11 2109  . potassium chloride 10 MEQ/50ML IVPB           . potassium chloride 10 MEQ/50ML IVPB           . potassium chloride 10 MEQ/50ML IVPB           . potassium  chloride 10 MEQ/50ML IVPB           . potassium chloride 20 MEQ/15ML (10%) liquid 40 mEq  40 mEq Per Tube Once Kalman Shan, MD   40 mEq at 05/25/11 0634  . potassium chloride 20 MEQ/15ML (10%) liquid 40 mEq  40 mEq Per Tube TID Lauris Poag, MD   20 mEq at 05/25/11 2245  . potassium chloride 20 MEQ/15ML (10%) liquid 40 mEq  40 mEq Oral BID Zada Girt, MD   40 mEq at 06/05/11 0924  . potassium chloride 20 MEQ/15ML (10%) liquid        40 mEq at 05/25/11 1649  . potassium chloride SA (K-DUR,KLOR-CON) CR tablet 40 mEq  40 mEq Oral Once Lauris Poag, MD   40 mEq at 05/23/11 1001  . predniSONE 5 MG/5ML solution 5 mg  5 mg Oral Q breakfast Zada Girt, MD   5 mg at 06/05/11 0750  . propofol (DIPRIVAN) 10 MG/ML infusion           . pyridostigmine (MESTINON) tablet 30 mg  30 mg Oral TID Laurey Morale, MD   30 mg at 06/05/11 0926  . QUEtiapine (SEROQUEL) tablet 100 mg  100 mg Oral QHS Merwyn Katos, MD   100 mg at 06/04/11 2128  . QUEtiapine (SEROQUEL) tablet 100 mg  100 mg Oral Once Oretha Milch, MD   100 mg at 06/03/11 1507  . QUEtiapine (SEROQUEL) tablet 25 mg  25 mg Oral Once Lupita Leash, MD   25 mg at 05/23/11 0334  . QUEtiapine (SEROQUEL) tablet 50 mg  50 mg Oral Once Lupita Leash, MD   50 mg at 05/24/11 0237  . rocuronium (ZEMURON) 50 MG/5ML injection           . sodium chloride 0.9 % infusion           . sodium chloride 0.9 % injection 10-40 mL  10-40 mL Intracatheter Q12H Storm Frisk, MD   10 mL at 06/05/11 0927  . sodium chloride 0.9 % injection 10-40 mL  10-40 mL Intracatheter PRN Storm Frisk, MD      . succinylcholine (ANECTINE) 20 MG/ML injection           . tacrolimus (PROGRAF) capsule 5 mg  5 mg Oral BID Darnelle Maffucci, MD   5 mg at 06/05/11 0925  . Vilazodone HCl TABS 20 mg  20 mg Oral Daily Storm Frisk, MD   20 mg at 06/05/11 0925  . DISCONTD: 0.45 % sodium chloride infusion   Intravenous Continuous Lauris Poag, MD 10 mL/hr at 05/31/11 0511     . DISCONTD: 0.9 %  sodium chloride infusion  250 mL Intravenous Continuous Barnett Abu, MD 40 mL/hr at 05/25/11 0700 250 mL at 05/25/11 0700  . DISCONTD: 0.9 %  sodium chloride infusion   Intravenous Continuous Barnett Abu, MD 10 mL/hr at  05/27/11 0011 1,000 mL at 05/27/11 0011  . DISCONTD: 0.9 % irrigation (POUR BTL)    PRN Barnett Abu, MD   1,000 mL at 05/16/11 0932  . DISCONTD: acetaminophen (TYLENOL) suppository 650 mg  650 mg Rectal Q4H PRN Barnett Abu, MD      . DISCONTD: acetaminophen (TYLENOL) tablet 650 mg  650 mg Oral Q4H PRN Barnett Abu, MD   650 mg at 05/24/11 2002  . DISCONTD: acetaminophen (TYLENOL) tablet 650 mg  650 mg Per Tube Q4H PRN Merwyn Katos, MD      . DISCONTD: albuterol (PROVENTIL HFA;VENTOLIN HFA) 108 (90 BASE) MCG/ACT inhaler 2 puff  2 puff Inhalation Q6H PRN Barnett Abu, MD   2 puff at 05/18/11 1840  . DISCONTD: aspirin EC tablet 81 mg  81 mg Oral Daily Barnett Abu, MD   81 mg at 05/24/11 0939  . DISCONTD: atorvastatin (LIPITOR) tablet 40 mg  40 mg Oral q1800 Barnett Abu, MD   40 mg at 05/19/11 1756  . DISCONTD: bacitracin 50,000 Units in sodium chloride irrigation 0.9 % 500 mL irrigation    PRN Barnett Abu, MD      . DISCONTD: bupivacaine (MARCAINE) 0.5 % injection    PRN Barnett Abu, MD   30 mL at 05/16/11 0932  . DISCONTD: calcium carbonate (OS-CAL - dosed in mg of elemental calcium) tablet 1,000 mg of elemental calcium  2 tablet Oral Daily Barnett Abu, MD   1,000 mg of elemental calcium at 05/28/11 1012  . DISCONTD: carbidopa-levodopa (SINEMET CR) 25-100 MG per tablet 1 tablet  1 tablet Oral TID Barnett Abu, MD      . DISCONTD: carbidopa-levodopa (SINEMET) 25-100 MG per tablet 1 tablet  1 tablet Oral TID Barnett Abu, MD   1 tablet at 05/28/11 2149  . DISCONTD: chlorhexidine (PERIDEX) 0.12 % solution 15 mL  15 mL Mouth/Throat BID Lonia Farber, MD   15 mL at 06/04/11 2129  . DISCONTD: dexmedetomidine (PRECEDEX) 200 mcg in sodium chloride 0.9  % 50 mL infusion  0.2-0.8 mcg/kg/hr Intravenous Titrated Oretha Milch, MD   0.5 mcg/kg/hr at 06/05/11 0700  . DISCONTD: dexmedetomidine (PRECEDEX) 400 mcg in sodium chloride 0.9 % 100 mL infusion  0.4-1.2 mcg/kg/hr Intravenous Titrated Storm Frisk, MD 20 mL/hr at 06/02/11 0900 0.8 mcg/kg/hr at 06/02/11 0900  . DISCONTD: dexmedetomidine (PRECEDEX) 400 mcg in sodium chloride 0.9 % 100 mL infusion  0.4-1.2 mcg/kg/hr Intravenous Titrated Merwyn Katos, MD   0.4 mcg/kg/hr at 06/02/11 1300  . DISCONTD: dextrose 10 % infusion   Intravenous Continuous Storm Frisk, MD      . DISCONTD: dextrose 10 % infusion   Intravenous Continuous Zigmund Gottron, MD      . DISCONTD: dextrose 10 % infusion   Intravenous Continuous Merwyn Katos, MD 20 mL/hr at 06/04/11 0947 20 mL/hr at 06/04/11 0947  . DISCONTD: diazepam (VALIUM) tablet 5 mg  5 mg Oral Q6H PRN Barnett Abu, MD   5 mg at 05/20/11 0702  . DISCONTD: docusate (COLACE) 50 MG/5ML liquid 100 mg  100 mg Per Tube BID Mariea Stable, MD   100 mg at 05/29/11 2158  . DISCONTD: docusate sodium (COLACE) capsule 100 mg  100 mg Oral BID Barnett Abu, MD   100 mg at 05/23/11 2121  . DISCONTD: etomidate (AMIDATE) injection 40 mg  40 mg Intravenous Once Merwyn Katos, MD      . DISCONTD: ezetimibe (ZETIA) tablet 10 mg  10 mg Oral  Daily Barnett Abu, MD   10 mg at 05/19/11 0957  . DISCONTD: feeding supplement (NEPRO CARB STEADY) liquid 1,000 mL  1,000 mL Per Tube Continuous Storm Frisk, MD 65 mL/hr at 05/30/11 0846 1,000 mL at 05/30/11 0846  . DISCONTD: feeding supplement (NEPRO CARB STEADY) liquid 1,000 mL  1,000 mL Per Tube Continuous Merwyn Katos, MD 30 mL/hr at 05/31/11 1500 1,000 mL at 05/31/11 1500  . DISCONTD: feeding supplement (OSMOLITE 1.2 CAL) liquid 1,000 mL  1,000 mL Per Tube Continuous Tonye Becket, RD 65 mL/hr at 05/28/11 2301 1,000 mL at 05/28/11 2301  . DISCONTD: feeding supplement (PRO-STAT SUGAR FREE 64) liquid 30 mL  30 mL  Per Tube TID Tonye Becket, RD   30 mL at 05/28/11 2154  . DISCONTD: fentaNYL (DURAGESIC - dosed mcg/hr) 100 mcg  100 mcg Transdermal Q72H Storm Frisk, MD   100 mcg at 06/01/11 1551  . DISCONTD: fentaNYL (SUBLIMAZE) 10 mcg/mL in sodium chloride 0.9 % 250 mL infusion  25-400 mcg/hr Intravenous Continuous Lonia Farber, MD 20 mL/hr at 05/30/11 1318 200 mcg/hr at 05/30/11 1318  . DISCONTD: fentaNYL (SUBLIMAZE) 10 mcg/mL in sodium chloride 0.9 % 250 mL infusion  50 mcg/hr Intravenous Continuous Merwyn Katos, MD 5 mL/hr at 06/01/11 0500 50 mcg/hr at 06/01/11 0500  . DISCONTD: fentaNYL (SUBLIMAZE) 10 mcg/mL in sodium chloride 0.9 % 250 mL infusion  25-200 mcg/hr Intravenous Continuous Nyoka Cowden, MD   25 mcg/hr at 06/02/11 0900  . DISCONTD: fentaNYL (SUBLIMAZE) injection 12.5-25 mcg  12.5-25 mcg Intravenous Q2H PRN Merwyn Katos, MD   25 mcg at 06/02/11 1701  . DISCONTD: fentaNYL (SUBLIMAZE) injection 200 mcg  200 mcg Intravenous Once Merwyn Katos, MD      . DISCONTD: Fluticasone-Salmeterol (ADVAIR) 250-50 MCG/DOSE inhaler 1 puff  1 puff Inhalation Q12H Barnett Abu, MD   1 puff at 05/21/11 [phone]  . DISCONTD: free water 200 mL  200 mL Per Tube Q6H Alyson Reedy, MD   200 mL at 05/28/11 0715  . DISCONTD: free water 200 mL  200 mL Per Tube 6 X Daily Lauris Poag, MD   200 mL at 05/29/11 0533  . DISCONTD: free water 250 mL  250 mL Per Tube Q4H Storm Frisk, MD   250 mL at 06/02/11 0631  . DISCONTD: furosemide (LASIX) injection 40 mg  40 mg Intravenous BID Laurey Morale, MD   40 mg at 05/19/11 0810  . DISCONTD: furosemide (LASIX) injection 40 mg  40 mg Intravenous Q12H Storm Frisk, MD      . DISCONTD: furosemide (LASIX) injection 40 mg  40 mg Intravenous Q12H Lauris Poag, MD   40 mg at 05/24/11 2140  . DISCONTD: furosemide (LASIX) injection 40 mg  40 mg Intravenous Q8H Laurey Morale, MD   40 mg at 05/26/11 0816  . DISCONTD: furosemide (LASIX) injection 40 mg  40  mg Intravenous Q12H Storm Frisk, MD   40 mg at 06/01/11 2104  . DISCONTD: furosemide (LASIX) injection 60 mg  60 mg Intravenous BID Storm Frisk, MD   60 mg at 05/20/11 0743  . DISCONTD: furosemide (LASIX) injection 60 mg  60 mg Intravenous TID Kathlen Brunswick, MD      . DISCONTD: furosemide (LASIX) injection 60 mg  60 mg Intravenous TID Lauris Poag, MD   60 mg at 05/24/11 0939  . DISCONTD: furosemide (LASIX) injection 80 mg  80  mg Intravenous TID Maree Krabbe, MD   80 mg at 05/22/11 1618  . DISCONTD: furosemide (LASIX) injection 80 mg  80 mg Intravenous Q12H Zada Girt, MD      . DISCONTD: furosemide (LASIX) injection 80 mg  80 mg Intravenous Q12H Nolon Lennert, MD   80 mg at 06/04/11 0634  . DISCONTD: furosemide (LASIX) tablet 40 mg  40 mg Oral Daily Barnett Abu, MD   40 mg at 05/17/11 1020  . DISCONTD: furosemide (LASIX) tablet 40 mg  40 mg Oral QPM Laurey Morale, MD   40 mg at 05/18/11 1656  . DISCONTD: furosemide (LASIX) tablet 80 mg  80 mg Oral Q breakfast Laurey Morale, MD   80 mg at 05/18/11 0817  . DISCONTD: haloperidol lactate (HALDOL) injection 1-4 mg  1-4 mg Intravenous Q3H PRN Oretha Milch, MD   4 mg at 05/23/11 2109  . DISCONTD: haloperidol lactate (HALDOL) injection 5 mg  5 mg Intravenous Once Zigmund Gottron, MD      . DISCONTD: hemostatic agents    PRN Barnett Abu, MD   1 application at 05/16/11 1009  . DISCONTD: hydrocortisone sodium succinate (SOLU-CORTEF) 100 mg/2 mL injection 100 mg  100 mg Intravenous Q8H Kathlen Brunswick, MD   100 mg at 05/22/11 1020  . DISCONTD: hydrocortisone sodium succinate (SOLU-CORTEF) 100 mg/2 mL injection 20 mg  20 mg Intravenous Daily Lauris Poag, MD      . DISCONTD: hydrocortisone sodium succinate (SOLU-CORTEF) 100 mg/2 mL injection 50 mg  50 mg Intravenous Q8H Lauris Poag, MD   50 mg at 05/23/11 0159  . DISCONTD: HYDROmorphone (DILAUDID) injection 0.25-0.5 mg  0.25-0.5 mg Intravenous Q5 min PRN Raiford Simmonds, MD   0.5 mg at 05/16/11 1442  . DISCONTD: insulin aspart (novoLOG) injection 0-10 Units  0-10 Units Subcutaneous Q4H Storm Frisk, MD      . DISCONTD: insulin aspart (novoLOG) injection 0-20 Units  0-20 Units Subcutaneous TID WC Penny Pia, MD      . DISCONTD: insulin aspart (novoLOG) injection 0-20 Units  0-20 Units Subcutaneous Q4H Bernadene Person, NP   15 Units at 05/29/11 0752  . DISCONTD: insulin aspart (novoLOG) injection 0-20 Units  0-20 Units Subcutaneous Q4H Merwyn Katos, MD   4 Units at 06/02/11 0353  . DISCONTD: insulin aspart (novoLOG) injection 0-4 Units  0-4 Units Subcutaneous Q4H Zigmund Gottron, MD   1 Units at 05/30/11 0403  . DISCONTD: insulin glargine (LANTUS) injection 10 Units  10 Units Subcutaneous Daily Oretha Milch, MD   10 Units at 05/21/11 1809  . DISCONTD: insulin glargine (LANTUS) injection 10 Units  10 Units Subcutaneous Daily Oretha Milch, MD   10 Units at 06/05/11 [phone]  . DISCONTD: insulin glargine (LANTUS) injection 12 Units  12 Units Subcutaneous Daily Darnelle Maffucci, MD   12 Units at 05/27/11 1029  . DISCONTD: insulin glargine (LANTUS) injection 12 Units  12 Units Subcutaneous BID Alyson Reedy, MD      . DISCONTD: insulin glargine (LANTUS) injection 12 Units  12 Units Subcutaneous BID Alyson Reedy, MD   12 Units at 05/28/11 2151  . DISCONTD: insulin glargine (LANTUS) injection 20 Units  20 Units Subcutaneous Daily Merwyn Katos, MD   20 Units at 06/01/11 [phone]  . DISCONTD: insulin glargine (LANTUS) injection 24 Units  24 Units Subcutaneous Daily Nolon Lennert, MD      . DISCONTD: insulin  glargine (LANTUS) injection 24 Units  24 Units Subcutaneous Q24H Barnett Abu, MD      . DISCONTD: insulin glargine (LANTUS) injection 24 Units  24 Units Subcutaneous BID Alyson Reedy, MD      . DISCONTD: insulin pump   Subcutaneous TID AC, HS, 0200 Penny Pia, MD      . DISCONTD: insulin regular (NOVOLIN R,HUMULIN R) 1 Units/mL in sodium  chloride 0.9 % 100 mL infusion   Intravenous Continuous Storm Frisk, MD   4.3 Units/hr at 05/30/11 0300  . DISCONTD: ipratropium (ATROVENT) nebulizer solution 0.5 mg  0.5 mg Nebulization QID Bernadene Person, NP   0.5 mg at 05/30/11 1142  . DISCONTD: isosorbide mononitrate (IMDUR) 24 hr tablet 60 mg  60 mg Oral Daily Barnett Abu, MD   60 mg at 05/19/11 0958  . DISCONTD: ketorolac (TORADOL) 15 MG/ML injection 15 mg  15 mg Intravenous Q8H PRN Oretha Milch, MD   15 mg at 05/22/11 2006  . DISCONTD: lamotrigine (LAMICTAL) disintegrating tablet 5 mg/kg/day  5 mg/kg/day Oral Daily Barnett Abu, MD      . DISCONTD: levalbuterol Pauline Aus) nebulizer solution 0.63 mg  0.63 mg Nebulization Q6H Storm Frisk, MD   0.63 mg at 05/21/11 1508  . DISCONTD: levalbuterol (XOPENEX) nebulizer solution 0.63 mg  0.63 mg Nebulization QID Dorethea Clan, RRT   0.63 mg at 05/30/11 1141  . DISCONTD: lidocaine (cardiac) 100 mg/39ml (XYLOCAINE) 20 MG/ML injection 2%           . DISCONTD: lidocaine-EPINEPHrine (XYLOCAINE W/EPI) 1 %-1:100000 (with pres) injection    PRN Barnett Abu, MD   20 mL at 05/16/11 0936  . DISCONTD: linagliptin (TRADJENTA) tablet 5 mg  5 mg Oral Daily Barnett Abu, MD   5 mg at 05/30/11 1036  . DISCONTD: LORazepam (ATIVAN) injection 1 mg  1 mg Intravenous Q6H PRN Cristi Loron, MD   1 mg at 05/24/11 0245  . DISCONTD: magnesium hydroxide (MILK OF MAGNESIA) suspension 30 mL  30 mL Oral Daily PRN Barnett Abu, MD   30 mL at 05/23/11 2126  . DISCONTD: magnesium oxide (MAG-OX) tablet 800 mg  800 mg Oral BID Barnett Abu, MD   800 mg at 05/19/11 2230  . DISCONTD: menthol-cetylpyridinium (CEPACOL) lozenge 3 mg  1 lozenge Oral PRN Barnett Abu, MD      . DISCONTD: methylPREDNISolone sodium succinate (SOLU-MEDROL) 40 mg/mL injection 4 mg  4 mg Intravenous Daily Zada Girt, MD   4 mg at 06/03/11 1354  . DISCONTD: micafungin (MYCAMINE) 100 mg in sodium chloride 0.9 % 100 mL IVPB  100 mg  Intravenous Daily Ginnie Smart, MD   100 mg at 05/31/11 1200  . DISCONTD: midazolam (VERSED) 1 mg/mL in sodium chloride 0.9 % 50 mL infusion  1-10 mg/hr Intravenous Continuous Alyson Reedy, MD 5 mL/hr at 05/30/11 0800 5 mg/hr at 05/30/11 0800  . DISCONTD: midazolam (VERSED) 2 MG/2ML injection           . DISCONTD: midazolam (VERSED) injection 1-2 mg  1-2 mg Intravenous Q4H PRN Merwyn Katos, MD   1 mg at 06/01/11 0300  . DISCONTD: midazolam (VERSED) injection 5 mg  5 mg Intravenous Once Merwyn Katos, MD      . DISCONTD: morphine 4 MG/ML injection 1-4 mg  1-4 mg Intravenous Q3H PRN Barnett Abu, MD   4 mg at 05/18/11 0718  . DISCONTD: mycophenolate (CELLCEPT) 200 MG/ML suspension 500 mg  500  mg Oral BID Zada Girt, MD   500 mg at 06/01/11 2104  . DISCONTD: mycophenolate (CELLCEPT) 200 MG/ML suspension 500 mg  500 mg Oral BID Storm Frisk, MD      . DISCONTD: mycophenolate (CELLCEPT) 500 mg in dextrose 5 % 100 mL IVPB  500 mg Intravenous Q12H Mariea Stable, MD   500 mg at 05/29/11 2306  . DISCONTD: mycophenolate (CELLCEPT) 500 mg in dextrose 5 % 100 mL IVPB  500 mg Intravenous Q12H [place] Abraham, PHARMD   500 mg at 06/03/11 2227  . DISCONTD: mycophenolate (CELLCEPT) capsule 500 mg  500 mg Oral BID Barnett Abu, MD   500 mg at 05/24/11 0946  . DISCONTD: mycophenolate (CELLCEPT) injection 0.5 g  0.5 g Intravenous BID Mariea Stable, MD      . DISCONTD: mycophenolate (CELLCEPT) injection 0.5 g  0.5 g Intravenous BID Zada Girt, MD      . DISCONTD: mycophenolate (CELLCEPT) Pediatric oral suspension 50 mg/mL  500 mg Per Tube BID Zada Girt, MD      . DISCONTD: mycophenolate (CELLCEPT) Pediatric oral suspension 50 mg/mL  500 mg Oral BID Zada Girt, MD      . DISCONTD: mycophenolate (CELLCEPT) Pediatric oral suspension 50 mg/mL  500 mg Oral BID Storm Frisk, MD      . DISCONTD: mycophenolate (CELLCEPT) tablet 500 mg  500 mg Oral BID Barnett Abu, MD      . DISCONTD:  ondansetron (ZOFRAN) injection 4 mg  4 mg Intravenous Q6H PRN Raiford Simmonds, MD      . DISCONTD: oxyCODONE (Oxy IR/ROXICODONE) immediate release tablet 5 mg  5 mg Oral BID PRN Barnett Abu, MD   5 mg at 05/19/11 2116  . DISCONTD: oxyCODONE (Oxy IR/ROXICODONE) immediate release tablet 5 mg  5 mg Oral Q6H PRN Barnett Abu, MD   5 mg at 05/24/11 0245  . DISCONTD: oxyCODONE (Oxy IR/ROXICODONE) immediate release tablet 5 mg  5 mg Oral BID PRN Oretha Milch, MD      . DISCONTD: oxyCODONE-acetaminophen (PERCOCET) 5-325 MG per tablet 1-2 tablet  1-2 tablet Oral Q4H PRN Barnett Abu, MD   2 tablet at 05/19/11 0756  . DISCONTD: pantoprazole (PROTONIX) EC tablet 40 mg  40 mg Oral BID Barnett Abu, MD   40 mg at 05/23/11 2115  . DISCONTD: pantoprazole (PROTONIX) EC tablet 40 mg  40 mg Oral BID Oretha Milch, MD      . DISCONTD: pantoprazole (PROTONIX) injection 40 mg  40 mg Intravenous Q24H Lonia Farber, MD   40 mg at 05/26/11 0501  . DISCONTD: pantoprazole (PROTONIX) injection 40 mg  40 mg Intravenous Q24H Oretha Milch, MD   40 mg at 06/04/11 0759  . DISCONTD: pantoprazole sodium (PROTONIX) 40 mg/20 mL oral suspension 40 mg  40 mg Per Tube Q1200 Barnett Abu, MD   40 mg at 06/01/11 1128  . DISCONTD: phenol (CHLORASEPTIC) mouth spray 1 spray  1 spray Mouth/Throat PRN Barnett Abu, MD      . DISCONTD: piperacillin-tazobactam (ZOSYN) IVPB 3.375 g  3.375 g Intravenous Q8H Drake Leach Rumbarger, PHARMD   3.375 g at 05/31/11 1741  . DISCONTD: potassium chloride 10 mEq in 100 mL IVPB  10 mEq Intravenous Q1 Hr x 4 Darnelle Maffucci, MD      . DISCONTD: potassium chloride 10 mEq in 100 mL IVPB  10 mEq Intravenous Q1 Hr x 6 Zada Girt, MD      .  DISCONTD: potassium chloride 10 mEq in 50 mL *CENTRAL LINE* IVPB  10 mEq Intravenous Q1 Hr x 2 Lauris Poag, MD      . DISCONTD: predniSONE (DELTASONE) tablet 5 mg  5 mg Oral Daily Barnett Abu, MD   5 mg at 05/19/11 1002  . DISCONTD: predniSONE 5 MG/5ML solution  10 mg  10 mg Per Tube Q breakfast Merwyn Katos, MD      . DISCONTD: predniSONE 5 MG/5ML solution 10 mg  10 mg Per Tube Q breakfast Storm Frisk, MD   10 mg at 06/01/11 0953  . DISCONTD: propofol (DIPRIVAN) 10 MG/ML infusion  5-70 mcg/kg/min Intravenous Continuous Konstantin Zubelevitskiy, MD 17.7 mL/hr at 05/25/11 1000 30 mcg/kg/min at 05/25/11 1000  . DISCONTD: pyridostigmine (MESTINON) tablet 60 mg  60 mg Oral TID Barnett Abu, MD   60 mg at 05/24/11 2243  . DISCONTD: risperiDONE (RISPERDAL) tablet 1 mg  1 mg Oral QHS Oretha Milch, MD   1 mg at 05/23/11 2122  . DISCONTD: risperiDONE (RISPERDAL) tablet 2 mg  2 mg Oral QHS Barnett Abu, MD   2 mg at 05/19/11 2230  . DISCONTD: rocuronium (ZEMURON) 50 MG/5ML injection           . DISCONTD: senna (SENOKOT) tablet 8.6 mg  1 tablet Oral BID Barnett Abu, MD   8.6 mg at 05/30/11 1034  . DISCONTD: sodium chloride 0.9 % injection 3 mL  3 mL Intravenous Q12H Barnett Abu, MD   3 mL at 05/25/11 1000  . DISCONTD: sodium chloride 0.9 % injection 3 mL  3 mL Intravenous PRN Barnett Abu, MD      . DISCONTD: succinylcholine (ANECTINE) 20 MG/ML injection           . DISCONTD: tacrolimus (PROGRAF) 1 mg/mL oral suspension 5 mg  5 mg Oral BID Zada Girt, MD      . DISCONTD: tacrolimus (PROGRAF) capsule 5 mg  5 mg Oral BID Barnett Abu, MD   5 mg at 05/24/11 0945  . DISCONTD: Tamsulosin HCl (FLOMAX) capsule 0.4 mg  0.4 mg Oral Daily Barnett Abu, MD   0.4 mg at 05/30/11 1034  . DISCONTD: temazepam (RESTORIL) capsule 15 mg  15 mg Oral QHS PRN Barnett Abu, MD   15 mg at 05/17/11 2159  . DISCONTD: thrombin kit 5000 units    PRN Barnett Abu, MD   5,000 Units at 05/16/11 1010  . DISCONTD: tiotropium (SPIRIVA) inhalation capsule 18 mcg  18 mcg Inhalation Daily Barnett Abu, MD   18 mcg at 05/21/11 [phone]  . DISCONTD: vancomycin (VANCOCIN) 1,250 mg in sodium chloride 0.9 % 250 mL IVPB  1,250 mg Intravenous Q24H Barnett Abu, MD      . DISCONTD: vancomycin  (VANCOCIN) 1,250 mg in sodium chloride 0.9 % 250 mL IVPB  1,250 mg Intravenous Q24H Barnett Abu, MD   1,250 mg at 05/28/11 2150  . DISCONTD: vancomycin (VANCOCIN) 750 mg in sodium chloride 0.9 % 150 mL IVPB  750 mg Intravenous Q12H Barnett Abu, MD   750 mg at 05/31/11 1741  . DISCONTD: vancomycin (VANCOCIN) IVPB 1000 mg/200 mL premix  1,000 mg Intravenous Q12H Drake Leach Rumbarger, PHARMD   1,000 mg at 05/23/11 1004  . DISCONTD: vecuronium (NORCURON) injection 10 mg  10 mg Intravenous Once Merwyn Katos, MD      . DISCONTD: Vilazodone HCl (VIIBRYD) TABS 20 mg  20 mg Oral Daily Barnett Abu, MD      .  DISCONTD: Vilazodone HCl TABS 20 mg  20 mg Oral Daily Barnett Abu, MD   20 mg at 05/19/11 1004   Medications Prior to Admission  Medication Sig Dispense Refill  . albuterol (PROVENTIL HFA;VENTOLIN HFA) 108 (90 BASE) MCG/ACT inhaler Inhale 2 puffs into the lungs every 6 (six) hours as needed for wheezing.  1 Inhaler  0  . aspirin EC 81 MG tablet Take 1 tablet (81 mg total) by mouth daily.  150 tablet  2  . atorvastatin (LIPITOR) 40 MG tablet Take 40 mg by mouth daily.        . calcium carbonate (OS-CAL - DOSED IN MG OF ELEMENTAL CALCIUM) 1250 MG tablet Take 2 tablets by mouth daily.      . carbidopa-levodopa (SINEMET CR) 25-100 MG per tablet Take 1 tablet by mouth 3 (three) times daily.        . clopidogrel (PLAVIX) 75 MG tablet Take 75 mg by mouth daily.      Marland Kitchen ezetimibe (ZETIA) 10 MG tablet Take 10 mg by mouth daily.        . Ferrous Sulfate (IRON) 325 (65 FE) MG TABS Take 1 tablet by mouth daily.       . Fluticasone-Salmeterol (ADVAIR DISKUS) 250-50 MCG/DOSE AEPB Inhale 1 puff into the lungs every 12 (twelve) hours.        . folic acid (FOLVITE) 800 MCG tablet Take 800 mcg by mouth daily.      . furosemide (LASIX) 40 MG tablet Take 2 tablets in the am and 1 tablet in the pm daily by mouth  90 tablet  2  . Insulin Aspart (NOVOLOG [place]) Inject into the skin continuous. Via pump as directed         . isosorbide mononitrate (IMDUR) 60 MG 24 hr tablet Take 1 tablet (60 mg total) by mouth daily.  30 tablet  11  . LamoTRIgine (LAMICTAL ODT PO) Take 200 mg by mouth daily.       . magnesium oxide (MAG-OX) 400 MG tablet Take 800 mg by mouth 2 (two) times daily.       . Multiple Vitamin (MULITIVITAMIN WITH MINERALS) TABS Take 1 tablet by mouth daily.      . mycophenolate (CELLCEPT) 500 MG tablet Take 500 mg by mouth 2 (two) times daily.       Marland Kitchen oxyCODONE (OXY IR/ROXICODONE) 5 MG immediate release tablet Take 1 tablet by mouth 2 (two) times daily as needed. For pain      . pantoprazole (PROTONIX) 40 MG tablet Take 40 mg by mouth 2 (two) times daily.       . predniSONE (DELTASONE) 5 MG tablet Take 5 mg by mouth daily.        Marland Kitchen pyridostigmine (MESTINON) 60 MG tablet Take 60 mg by mouth 3 (three) times daily.      . risperiDONE (RISPERDAL) 2 MG tablet Take 1 tablet by mouth Daily.      . saxagliptin HCl (ONGLYZA) 5 MG TABS tablet Take 5 mg by mouth daily.        . tacrolimus (PROGRAF) 5 MG capsule Take 5 mg by mouth 2 (two) times daily.        . Tamsulosin HCl (FLOMAX) 0.4 MG CAPS Take 0.4 mg by mouth daily.       . temazepam (RESTORIL) 15 MG capsule Take 15 mg by mouth at bedtime as needed. For sleep      . tiotropium (SPIRIVA HANDIHALER) 18 MCG inhalation capsule Place 1  capsule (18 mcg total) into inhaler and inhale daily.  30 capsule  6  . Vilazodone HCl (VIIBRYD) 40 MG TABS Take 20 mg by mouth daily.       . vitamin B-12 (CYANOCOBALAMIN) 500 MCG tablet Take 500 mcg by mouth daily.      Marland Kitchen VITAMIN D, CHOLECALCIFEROL, PO Take 10,000 Units by mouth 3 (three) times a week. On Monday, Wednesday, and Friday      . clobetasol (OLUX) 0.05 % topical foam Apply 1 application topically daily as needed. For head        Home: Home Living Lives With: Spouse Available Help at Discharge: Family Receives Help From: Family Type of Home: House Home Access: Stairs to enter Secretary/administrator of Steps:  3 Entrance Stairs-Rails: Right Home Layout: Multi-level;Able to live on main level with bedroom/bathroom Alternate Level Stairs-Number of Steps: 12 Bathroom Shower/Tub: Engineer, manufacturing systems: Standard Bathroom Accessibility: Yes Home Adaptive Equipment: Hand-held shower hose;Shower chair with back;Straight cane;Walker - rolling;Bedside commode/3-in-1  Functional History: Prior Function Level of Independence: Independent with gait;Independent with transfers Bath:  (occasional assist to get in/out of shower) Dressing:  (occasional assist with shoes.) Able to Take Stairs?: Yes Driving: Yes Vocation: Retired Leisure: Hobbies-yes (Comment) Comments: fixes cars Functional Status:  Mobility: Bed Mobility Bed Mobility: Yes Rolling Right: 1: +2 Total assist;With rail;Patient percentage (comment) Rolling Right Details (indicate cue type and reason): pt able to flex knees without assist, required assist to maintain knees flexed and to initiate rolling to Rt, assist with bed pad, pt= 25% Rolling Left: 1: +2 Total assist;Patient percentage (comment);With rail Rolling Left Details (indicate cue type and reason): as with right Right Sidelying to Sit: 1: +2 Total assist;Patient percentage (comment);HOB elevated (comment degrees);With rails Right Sidelying to Sit Details (indicate cue type and reason): each leg elevated to reduce friction and pt then able to extend knee/kick leg over EOB, assist to raise torso due to weakness, pt = 50%, HOB 30 Left Sidelying to Sit: 1: +2 Total assist;Patient percentage (comment);HOB elevated (comment degrees) (30%; bed pad to assist hips to EOB) Left Sidelying to Sit Details (indicate cue type and reason): head of bed in 70% elevation; requires max assist to clear feet from foot of bed to side, and cannot actively scoot hips or support trunk to EOB; Sitting - Scoot to Edge of Bed: 1: +2 Total assist Sitting - Scoot to Edge of Bed Details (indicate cue type  and reason): use of bed pad, +1 to stabilize trunk and 2nd person to scoot hips forward Sit to Supine: 1: +2 Total assist;Patient percentage (comment) (pt 10%) Sit to Supine - Details (indicate cue type and reason): max cueing for back precautions, safe technique.   Sit to Sidelying Right: 1: +2 Total assist;Patient percentage (comment);HOB flat;With rail Sit to Sidelying Right Details (indicate cue type and reason): pt required max cues and assist to maintain back precautions (esp no twisting) as he tries to reach behind himself and transition sit to supine (vs sidelying) Transfers Transfers: Yes Sit to Stand: Patient percentage (comment);1: +1 Total assist;From elevated surface;With upper extremity assist;From bed (35%) Sit to Stand Details (indicate cue type and reason): braced right knee; pt reports feet both completely numb and prevents somatosensory awareness of floor;  front guard technique to facilitate hip anterior/superior translation; able to acheive partial stand x3 reps, requires physical support throughout activity with minimal to negligible load-bearing activity in legs.  Pt incontient of stool, RN attempted to clean peri-area during. Stand  to Sit: 1: +1 Total assist Stand to Sit Details: Pt required mod assist to control descent into chair. Lateral/Scoot Transfers: 1: +2 Total assist;Patient percentage (comment);From elevated surface Lateral/Scoot Transfer Details (indicate cue type and reason): scooting to pt's rt towards HOB while sitting EOB; Pt able to bear weight through RLE minimally to assist with transfer; scooted x 5 Ambulation/Gait Ambulation/Gait: No Ambulation/Gait Assistance: 1: +2 Total assist;Patient percentage (comment) (pt = 60%) Ambulation/Gait Assistance Details (indicate cue type and reason): Pt required two person assist for safety and VC and manual cues for placement of RW. Ambulation Distance (Feet): 30 Feet Assistive device: Rolling walker Gait Pattern:  Step-to pattern;Decreased stride length Stairs: No Wheelchair Mobility Wheelchair Mobility: No  ADL: ADL Grooming: Simulated;Maximal assistance Grooming Details (indicate cue type and reason): setup to gather grooming items Where Assessed - Grooming: Sitting, bed;Unsupported Upper Body Bathing: Simulated;Maximal assistance Where Assessed - Upper Body Bathing: Sitting, bed;Unsupported Lower Body Bathing: Simulated;+2 Total assistance Lower Body Bathing Details (indicate cue type and reason): Pt unable to cross ankles over knees to reach feet as he had done before surgery. Where Assessed - Lower Body Bathing: Sitting, bed;Lean right and/or left Upper Body Dressing: Simulated Upper Body Dressing Details (indicate cue type and reason): Donned gown with setup assist to snap sleeve around lines Where Assessed - Upper Body Dressing: Sitting, bed;Unsupported Lower Body Dressing: +2 Total assistance Lower Body Dressing Details (indicate cue type and reason): pt unable to cross ankles over knees to reach feet as he had done before surgery. Where Assessed - Lower Body Dressing: Lean right and/or left;Sitting, bed;Unsupported Toilet Transfer: Not assessed Toilet Transfer Details (indicate cue type and reason): +2 assist for safety,  Pt requiring increased time.  Toilet Transfer Method: Not assessed Acupuncturist: Other (comment) (chair) Toileting - Clothing Manipulation: Not assessed Equipment Used: Rolling walker Ambulation Related to ADLs: Pt reports that Rt foot feels numb during ambulation. +2 assist for safety and for lines and leads.  Pt fatigued quickly due to SOB. ADL Comments: Co session with PT. See PT note also.  Cognition: Cognition Arousal/Alertness: Awake/alert Orientation Level: Oriented to person;Oriented to place Cognition Arousal/Alertness: Awake/alert Overall Cognitive Status: Impaired Orientation Level: Oriented to person;Oriented to place  Blood pressure  112/56, pulse 89, temperature 98 F (36.7 C), temperature source Axillary, resp. rate 30, height 6\' 2"  (1.88 m), weight 96.5 kg (212 lb 11.9 oz), SpO2 100.00%. Physical Exam  Vitals reviewed. HENT:  Head: Normocephalic.  Neck: Normal range of motion. Neck supple. No thyromegaly present.  Cardiovascular:       Cardiac rate control  Pulmonary/Chest:       Decreased breath sounds at the bases left greater than right  Abdominal: He exhibits no distension. There is no tenderness.  Musculoskeletal:       +1 edema lower extremities.  Neurological: He is alert.       Patient is mildly anxious requesting pain medication. He was able to provide his name, "[place]"and date of birth accurately. He does have some confusion over his long hospital stay and all events that have occurred. He follows to 2- 3 step commands. Pill rolling tremor, intentional tremor ue greater than lower ext. Strength 4/5 in ue and 2/5 to 4/5 in le. Sensory exam grossly intact. Withdraws to pain in both legs.  Skin:       Back incision covered.   Psychiatric: His mood appears anxious. His speech is rapid and/or pressured. He is is hyperactive. He expresses  impulsivity and inappropriate judgment. He exhibits abnormal recent memory.    Results for orders placed during the hospital encounter of 05/16/11 (from the past 24 hour(s))  GLUCOSE, CAPILLARY     Status: Abnormal   Collection Time   06/04/11  3:54 PM      Component Value Range   Glucose-Capillary 174 (*) 70 - 99 (mg/dL)  GLUCOSE, CAPILLARY     Status: Abnormal   Collection Time   06/04/11  8:23 PM      Component Value Range   Glucose-Capillary 171 (*) 70 - 99 (mg/dL)  GLUCOSE, CAPILLARY     Status: Abnormal   Collection Time   06/04/11 11:55 PM      Component Value Range   Glucose-Capillary 223 (*) 70 - 99 (mg/dL)  CBC     Status: Abnormal   Collection Time   06/05/11  3:15 AM      Component Value Range   WBC 11.5 (*) 4.0 - 10.5 (K/uL)   RBC 3.73 (*) 4.22  - 5.81 (MIL/uL)   Hemoglobin 7.9 (*) 13.0 - 17.0 (g/dL)   HCT 16.1 (*) 09.6 - 52.0 (%)   MCV 68.9 (*) 78.0 - 100.0 (fL)   MCH 21.2 (*) 26.0 - 34.0 (pg)   MCHC 30.7  30.0 - 36.0 (g/dL)   RDW 04.5 (*) 40.9 - 15.5 (%)   Platelets 197  150 - 400 (K/uL)  MAGNESIUM     Status: Normal   Collection Time   06/05/11  3:15 AM      Component Value Range   Magnesium 1.7  1.5 - 2.5 (mg/dL)  RENAL FUNCTION PANEL     Status: Abnormal   Collection Time   06/05/11  3:15 AM      Component Value Range   Sodium 143  135 - 145 (mEq/L)   Potassium 4.2  3.5 - 5.1 (mEq/L)   Chloride 111  96 - 112 (mEq/L)   CO2 24  19 - 32 (mEq/L)   Glucose, Bld 192 (*) 70 - 99 (mg/dL)   BUN 27 (*) 6 - 23 (mg/dL)   Creatinine, Ser 8.11  0.50 - 1.35 (mg/dL)   Calcium 9.2  8.4 - 91.4 (mg/dL)   Phosphorus 1.8 (*) 2.3 - 4.6 (mg/dL)   Albumin 2.0 (*) 3.5 - 5.2 (g/dL)   GFR calc non Af Amer 72 (*) >90 (mL/min)   GFR calc Af Amer 83 (*) >90 (mL/min)  GLUCOSE, CAPILLARY     Status: Abnormal   Collection Time   06/05/11  3:18 AM      Component Value Range   Glucose-Capillary 165 (*) 70 - 99 (mg/dL)  GLUCOSE, CAPILLARY     Status: Abnormal   Collection Time   06/05/11  7:49 AM      Component Value Range   Glucose-Capillary 202 (*) 70 - 99 (mg/dL)  GLUCOSE, CAPILLARY     Status: Abnormal   Collection Time   06/05/11 11:23 AM      Component Value Range   Glucose-Capillary 243 (*) 70 - 99 (mg/dL)   Dg Abd Portable 1v  06/03/2011  *RADIOLOGY REPORT*  Clinical Data: Feeding tube placement.  PORTABLE ABDOMEN - 1 VIEW  Comparison: CT abdomen and pelvis without contrast 05/30/2011.  Findings: The tip of a small bore feeding tube is in the distal stomach.  Bibasilar airspace disease and effusions are noted.  IMPRESSION:  1.  The tip of a small bore feeding tube is in the distal stomach. 2.  Bibasilar airspace disease and effusions are similar to the prior exam.  Infection is not excluded.  Original Report Authenticated By: Jamesetta Orleans. MATTERN, M.D.    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 Does the need for close, 24 hr/day medical supervision in concert with the patient's rehab needs make it unreasonable for this patient to be served in a less intensive setting? Potentially 2. Co-Morbidities requiring supervision/potential complications: dm, copd, chf, cad 3. Due to bladder management, bowel management, safety, skin/wound care, disease management, medication administration, pain management and patient education, does the patient require 24 hr/day rehab nursing? Yes 4. Does the patient require coordinated care of a physician, rehab nurse, PT (1-2 hrs/day, 5 days/week), OT (1-2 hrs/day, 56 days/week) and SLP (1-2 hrs/day, 5 days/week) to address physical and functional deficits in the context of the above medical diagnosis(es)? Yes and Potentially Addressing deficits in the following areas: balance, endurance, locomotion, strength, transferring, bowel/bladder control, bathing, dressing, feeding, grooming, toileting, cognition, speech, language, swallowing and psychosocial support 5. Can the patient actively participate in an intensive therapy program of at least 3 hrs of therapy per day at least 5 days per week? Potentially 6. The potential for patient to make measurable gains while on inpatient rehab is excellent 7. Anticipated functional outcomes upon discharge from inpatient rehab are supervision to minimal assist PT, minimal to mod assist OT, minimal assist to supervision SLP 8. Estimated rehab length of stay to reach the above functional goals is: 3 weeks 9. Does the patient have adequate social supports to accommodate these discharge functional goals? Potentially 10. Anticipated D/C setting: Home 11. Anticipated post D/C treatments: HH therapy 12. Overall Rehab/Functional Prognosis: excellent and good  RECOMMENDATIONS: This  patient's condition is appropriate for continued rehabilitative care in the following setting: CIR Patient has agreed to participate in recommended program. Yes and Potentially Note that insurance prior authorization may be required for reimbursement for recommended care.  Comment: Need to follow up with family to makes sure they can provide the expected discharge care. Rehab RN to follow up.   Ivory Broad, MD 06/05/2011

## 2011-06-05 NOTE — Progress Notes (Signed)
Nutrition Follow-up  Diet Order:  Dysphagia 1 with honey thick liquids  Pt extubated on 4/12 am. Pt seen by SLP for swallow eval after extubation, recommended NPO. MD wrote for panda tube to be placed, Jevity 1.2 at 20 ml/hr running currently. Pt seen by SLP again this am, recommended diet D1 with honey thick liquids. FEES tomorrow.  Current TF providing 576 kcal, 27 gm protein. Free water bolus has been D/C'd.  Meds: Scheduled Meds:   . albuterol  2.5 mg Nebulization Q6H  . antiseptic oral rinse  1 application Mouth Rinse QID  . aspirin  81 mg Per Tube Daily  . calcium carbonate  2 tablet Per Tube Daily  . carbidopa-levodopa  1 tablet Per Tube TID  . fentaNYL  50 mcg Transdermal Q72H  . ferumoxytol  510 mg Intravenous Once  . furosemide  80 mg Oral BID  . insulin aspart  0-15 Units Subcutaneous Q4H  . insulin glargine  10 Units Subcutaneous Daily  . ipratropium  0.5 mg Nebulization Q6H  . lamoTRIgine  200 mg Oral Daily  . mycophenolate  500 mg Oral BID  . pantoprazole sodium  40 mg Per Tube BID  . potassium chloride  40 mEq Oral BID  . predniSONE  5 mg Oral Q breakfast  . pyridostigmine  30 mg Oral TID  . QUEtiapine  100 mg Oral QHS  . sodium chloride  10-40 mL Intracatheter Q12H  . tacrolimus  5 mg Oral BID  . Vilazodone HCl  20 mg Oral Daily  . DISCONTD: chlorhexidine  15 mL Mouth/Throat BID  . DISCONTD: furosemide  80 mg Intravenous Q12H  . DISCONTD: methylPREDNISolone (SOLU-MEDROL) injection  4 mg Intravenous Daily  . DISCONTD: mycophenolate  500 mg Oral BID  . DISCONTD: mycophenolate (CELLCEPT) IVPB  500 mg Intravenous Q12H  . DISCONTD: mycophenolate  500 mg Oral BID  . DISCONTD: mycophenolate  500 mg Oral BID  . DISCONTD: pantoprazole  40 mg Oral BID  . DISCONTD: tacrolimus  5 mg Oral BID   Continuous Infusions:   . sodium chloride 20 mL/hr at 06/04/11 1435  . sodium chloride 10 mL (06/03/11 2000)  . dexmedetomidine (PRECEDEX) IV infusion Stopped (06/05/11 1000)  .  feeding supplement (JEVITY 1.2 CAL) 1,000 mL (06/04/11 1104)  . DISCONTD: dextrose 20 mL/hr (06/04/11 0947)   PRN Meds:.acetaminophen (TYLENOL) oral liquid 160 mg/5 mL, albuterol, fentaNYL, food thickener, hydrALAZINE, hydrocortisone cream, ondansetron (ZOFRAN) IV, sodium chloride  Labs:  CMP     Component Value Date/Time   NA 143 06/05/2011 0315   K 4.2 06/05/2011 0315   CL 111 06/05/2011 0315   CO2 24 06/05/2011 0315   GLUCOSE 192* 06/05/2011 0315   BUN 27* 06/05/2011 0315   CREATININE 1.02 06/05/2011 0315   CALCIUM 9.2 06/05/2011 0315   PROT 5.6* 06/01/2011 0330   ALBUMIN 2.0* 06/05/2011 0315   AST 25 06/01/2011 0330   ALT 10 06/01/2011 0330   ALKPHOS 92 06/01/2011 0330   BILITOT 0.6 06/01/2011 0330   GFRNONAA 72* 06/05/2011 0315   GFRAA 83* 06/05/2011 0315     Intake/Output Summary (Last 24 hours) at 06/05/11 1002 Last data filed at 06/05/11 1000  Gross per 24 hour  Intake 1186.56 ml  Output   1225 ml  Net -38.44 ml    Weight Status:  212 lbs, trending back down from fluids.   Re-estimated needs:  2200-2400 kcal, 120-130 gm protein  Nutrition Dx:  Inadequate oral intake, improving  Goal:  EN  to provide >90% of estimated nutrition needs, unmet New Goal:  EN to provide >90% needs until PO intake is able to provide >50% of needs  Intervention:  Recommend increasing Jevity 1.2 to goal rate of 75 ml/hr, this will provide 2160 kcal and 100 gm protein.   Monitor:  TF, FEES, po intake, weight, labs, I/O's   Rudean Haskell Pager #:  [phone]

## 2011-06-05 NOTE — Progress Notes (Signed)
Speech Language Pathology Dysphagia Treatment  Patient Details Name: John [name] MRN: [id number] DOB: August 14, 1940 Today's Date: 06/05/2011  SLP Assessment/Plan/Recommendation Assessment / Recommendations / Plan Clinical Impression Statement: Pt with improved mentation though he continues to appear decompensated, weak. No overt s/s of aspiration observed with honey thick liquids and puree solids though swallow response appears delayed with diminished laryngeal elevation. Suspect delayed, weak swallow persists with risk of aspiration with thin liquids, residuals with thick consistencies. Pt able to initiate Honey thick liquids and pureed solids with objective test at bedside (FEES) tomorrow given pts questionable trunk control for MBS.  Initiate / Change Diet: Honey-thick liquid;Dysphagia 1 (puree) Liquids provided via: Cup Medication Administration: Crushed with puree Supervision: Full supervision/cueing for compensatory strategies;Patient able to self feed Compensations: Slow rate;Small sips/bites;Multiple dry swallows after each bite/sip Postural Changes and/or Swallow Maneuvers: Seated upright 90 degrees;Upright 30-60 min after meal Oral Care Recommendations: Oral care QID Plan: Goals updated Swallowing Goals  SLP Swallowing Goals Patient will consume recommended diet without observed clinical signs of aspiration with: Moderate assistance Patient will utilize recommended strategies during swallow to increase swallowing safety with: Minimal cueing Goal #3: Pt will consume PO trials with adequate breath support/phonation to determine readiness for diet vs. need for objective testing.  Swallow Study Goal #3 - Progress: Met Goal #4: Pt will complete FEES objective test to determine readiness for downgrade to thin liquids.   General Temperature Spikes Noted: No Respiratory Status: Supplemental O2 delivered via (comment) Behavior/Cognition: Alert;Cooperative;Requires cueing Oral Cavity -  Dentition: Dentures, top Patient Positioning: Upright in bed  Oral Cavity - Oral Hygiene Does patient have any of the following "at risk" factors?: Mucous Membranes - reddened;Lips - dry, cracked;Saliva - thick, dry mouth;Oxygen therapy - cannula, mask, simple oxygen devices Brush patient's teeth BID with toothbrush (using toothpaste with fluoride): Yes Patient is AT RISK - Oral Care Protocol followed (see row info): Yes   Dysphagia Treatment Treatment focused on: Skilled observation of diet tolerance;Upgraded PO texture trials;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

## 2011-06-05 NOTE — Progress Notes (Signed)
   CARE MANAGEMENT NOTE 06/05/2011  Patient:  GIBRIL, MASTRO   Account Number:  [id number]  Date Initiated:  05/17/2011  Documentation initiated by:  Vance Peper  Subjective/Objective Assessment:   71 yr old male s/p bilateral laminectomies, and foraminotomies decompression L5-S1     Action/Plan:   PTA, PT LIVES WITH SPOUSE.   Anticipated DC Date:     Anticipated DC Plan:  HOME W HOME HEALTH SERVICES      DC Planning Services  CM consult      Choice offered to / List presented to:             Status of service:  In process, will continue to follow Medicare Important Message given?   (If response is "NO", the following Medicare IM given date fields will be blank) Date Medicare IM given:   Date Additional Medicare IM given:    Discharge Disposition:    Per UR Regulation:    If discussed at Long Length of Stay Meetings, dates discussed:    Comments:  06/05/11 Khandi Kernes,RN,BSN 1600 REHAB CONSULT TODAY; MD FEELS PT APPROPRIATE FOR CIR. FAMILY ABLE TO PROVIDE 24HR CARE AT DISCHARGE.  WILL FOLLOW UP WITH REHAB RN REGARDING ADMISSION. Phone #[phone]   06/02/11 Mechelle Pates,RN,BSN 1600 PT EXTUBATED TODAY, AND DID NOT HAVE TO HAVE TRACHEOSTOMY. PHYSICAL THERAPY CONSULT PENDING.  MET WITH PT'S DAUGHTER TO DISCUSS DC PLANNING.  SHE STATES PT HAS BEEN THROUGH [facility] BEFORE AND THEY WOULD PREFER THIS TO [facility].  WILL AWAIT P.T. RECOMMENDATIONS--WILL NEED ORDER FOR REHAB CONSULT IF RECOMMENDED.

## 2011-06-05 NOTE — Progress Notes (Signed)
Patient ID: John [name], male   DOB: 1940-05-23, 71 y.o.   MRN: [id number] Alert, oriented. Spirits much better than previously noted. Denies back pain at this time. However he is requesting pain medications. I told [name] defer to his critical care specialists regarding pain medication.  He complains of constipation also.  On examination I note his motor strength is good in the proximal distal lower extremities however the patient notes that an attempt at walking today was very poorly successful. Will likely need comprehensive inpatient rehabilitation.

## 2011-06-05 NOTE — Evaluation (Signed)
Occupational Therapy Evaluation Patient Details Name: John [name] MRN: [id number] DOB: 1940/11/06 Today's Date: 06/05/2011  Problem List:  Patient Active Problem List  Diagnoses  . PURE HYPERCHOLESTEROLEMIA  . HYPERLIPIDEMIA-MIXED  . Coronary atherosclerosis of native coronary artery  . CORONARY ATHEROSLERO UNSPEC TYPE BYPASS GRAFT  . PERICARDIAL EFFUSION  . Atrial fibrillation  . Chronic diastolic heart failure  . PVD WITH CLAUDICATION  . INTERMITTENT CLAUDICATION, BILATERAL  . Orthostatic hypotension  . PLEURAL EFFUSION, RIGHT  . LEG PAIN, RIGHT  . Nonspecific abnormal unspecified cardiovascular function study  . Carotid stenosis  . Chronic kidney disease  . COPD (chronic obstructive pulmonary disease)  . Dyspnea  . First degree AV block  . Back pain  . Spondylosis  . Herniated nucleus pulposus  . Diastolic CHF, acute on chronic  . DM (diabetes mellitus), type 2  . Insulin pump in place  . Acute respiratory failure  . Pulmonary edema  . Hypoxemia  . Delirium due to general medical condition  . Dependence on respirator, status    Past Medical History:  Past Medical History  Diagnosis Date  . CAD (coronary artery disease)     a. myoview 1/11: lg inf fixed defect;   b. cath 1/11: 3v CAD,  c. s/p CABG 1/11 c/b acute renal failure and AFib  . Atrial fibrillation   . Chronic diastolic heart failure     a. echo 4/11: EF 45-50%, mod LVH, LAE, inf and septal HK  . First degree AV block   . Orthostatic hypotension     pyridostigmine  . History of renal transplant   . DM2 (diabetes mellitus, type 2)   . Diabetic gastroparesis   . HTN (hypertension)   . HLD (hyperlipidemia)   . PAD (peripheral artery disease)     s/p R SFA-Pop atherectomy 11/11  . Prostate cancer   . ED (erectile dysfunction)   . DDD (degenerative disc disease), lumbar   . Tremor   . Obesity   . Low testosterone   . Chronic kidney disease     s/p renal transplant 2002  . Pleural effusion,  right     loculated  . COPD (chronic obstructive pulmonary disease)   . Anxiety   . Diabetes mellitus     insulin pump   Past Surgical History:  Past Surgical History  Procedure Date  . Coronary artery bypass graft 03/2009  . Cataract extraction   . Vitrectomy   . Tonsillectomy   . Kidney transplant 2002  . Hernia repair   . Prostate cancer rx 2006 brachytherapy   . Prior peritoneal catheter placement   . Lumbar laminectomy/decompression microdiscectomy 05/16/2011    Procedure: LUMBAR LAMINECTOMY/DECOMPRESSION MICRODISCECTOMY 1 LEVEL;  Surgeon: Barnett Abu, MD;  Location: [facility];  Service: Neurosurgery;  Laterality: Bilateral;  Bilateral Lumbar Five-Sacral One Laminectomy    OT Assessment/Plan/Recommendation OT Assessment Clinical Impression Statement: Pt presents to OT with decreased I with ADL activity s/p  back surgery and medical complications during hospital stay. Pt will benefit from Inpatient rehab in prep for DC home with wife OT Recommendation/Assessment: Patient will need skilled OT in the acute care venue OT Problem List: Decreased strength;Decreased activity tolerance;Decreased safety awareness;Cardiopulmonary status limiting activity;Decreased knowledge of precautions OT Therapy Diagnosis : Generalized weakness OT Plan OT Frequency: Min 2X/week OT Treatment/Interventions: Self-care/ADL training OT Recommendation Follow Up Recommendations: Inpatient Rehab Equipment Recommended: Defer to next venue Individuals Consulted Consulted and Agree with Results and Recommendations: Patient OT Goals Acute Rehab OT  Goals OT Goal Formulation: With patient Time For Goal Achievement: 2 weeks ADL Goals Pt Will Perform Grooming: Standing at sink;with min assist ADL Goal: Grooming - Progress: Goal set today Pt Will Perform Lower Body Bathing: Sit to stand from chair;Sit to stand from bed;with adaptive equipment;with min assist ADL Goal: Lower Body Bathing - Progress: Goal  set today Pt Will Perform Lower Body Dressing: Sit to stand from chair;Sit to stand from bed;with adaptive equipment;with min assist Pt Will Transfer to Toilet: Ambulation;with DME;3-in-1;Maintaining back safety precautions;with min assist ADL Goal: Toilet Transfer - Progress: Goal set today Pt Will Perform Toileting - Clothing Manipulation: with supervision;Standing ADL Goal: Toileting - Clothing Manipulation - Progress: Goal set today Pt Will Perform Toileting - Hygiene: with min assist ADL Goal: Toileting - Hygiene - Progress: Goal set today Pt Will Perform Tub/Shower Transfer: Shower transfer;Ambulation;with DME;Maintaining back safety precautions;Shower seat with back;with min assist ADL Goal: Tub/Shower Transfer - Progress: Goal set today Miscellaneous OT Goals Miscellaneous OT Goal #1: Pt will verbalize and demonstrate 3/3 back precautions during ADL activity. OT Goal: Miscellaneous Goal #1 - Progress: Goal set today Miscellaneous OT Goal #2: Pt will perform bed mobility with supervision in prep for EOB ADLs. OT Goal: Miscellaneous Goal #2 - Progress: Goal set today  OT Evaluation Precautions/Restrictions  Precautions Precautions: Back;Fall Precaution Booklet Issued: No Precaution Comments: requires max cues to maintain back precautions. Required Braces or Orthoses DO NOT USE: No Restrictions Weight Bearing Restrictions: No Other Position/Activity Restrictions: no bend arch twist per back precautions Prior Functioning Home Living Lives With: Spouse Available Help at Discharge: Family Type of Home: House Home Access: Stairs to enter Secretary/administrator of Steps: 3 Entrance Stairs-Rails: Right Home Layout: Multi-level;Able to live on main level with bedroom/bathroom Alternate Level Stairs-Number of Steps: 12 Bathroom Shower/Tub: Tub/shower unit Bathroom Accessibility: Yes Prior Function Level of Independence: Independent Able to Take Stairs?: Yes  ADL ADL Grooming:  Simulated;Maximal assistance Where Assessed - Grooming: Sitting, bed;Unsupported Upper Body Bathing: Simulated;Maximal assistance Where Assessed - Upper Body Bathing: Sitting, bed;Unsupported Lower Body Bathing: Simulated;+2 Total assistance Where Assessed - Lower Body Bathing: Sitting, bed;Lean right and/or left Upper Body Dressing: Simulated Where Assessed - Upper Body Dressing: Sitting, bed;Unsupported Lower Body Dressing: +2 Total assistance Where Assessed - Lower Body Dressing: Lean right and/or left;Sitting, bed;Unsupported Toilet Transfer: Not assessed Toilet Transfer Method: Not assessed Toileting - Clothing Manipulation: Not assessed ADL Comments: Co session with PT. See PT note also.     Cognition Cognition Orientation Level: Oriented to person;Oriented to place    Extremity Assessment RUE Assessment RUE Assessment: Within Functional Limits LUE Assessment LUE Assessment: Within Functional Limits Mobility  Bed Mobility Rolling Right: 1: +2 Total assist;With rail;Patient percentage (comment) Rolling Right Details (indicate cue type and reason): pt able to flex knees without assist, required assist to maintain knees flexed and to initiate rolling to Rt, assist with bed pad, pt= 25% Right Sidelying to Sit: 1: +2 Total assist;Patient percentage (comment);HOB elevated (comment degrees);With rails Right Sidelying to Sit Details (indicate cue type and reason): each leg elevated to reduce friction and pt then able to extend knee/kick leg over EOB, assist to raise torso due to weakness, pt = 50%, HOB 30 Sitting - Scoot to Edge of Bed: 1: +2 Total assist Sitting - Scoot to Edge of Bed Details (indicate cue type and reason): use of bed pad, +1 to stabilize trunk and 2nd person to scoot hips forward Sit to Sidelying Right: 1: +2 Total assist;Patient  percentage (comment);HOB flat;With rail Sit to Sidelying Right Details (indicate cue type and reason): pt required max cues and assist to  maintain back precautions (esp no twisting) as he tries to reach behind himself and transition sit to supine (vs sidelying) Transfers Transfers: No End of Session General Behavior During Session: [facility] for tasks performed Cognition: Impaired Cognitive Impairment: slow processing, however remained on task   Brittanyann Wittner, [place] 06/05/2011, 12:13 PM

## 2011-06-05 NOTE — Progress Notes (Signed)
Physical Therapy Treatment Patient Details Name: John [name] MRN: [id number] DOB: 1940/07/10 Today's Date: 06/05/2011  PT Assessment/Plan  PT - Assessment/Plan Comments on Treatment Session: Pt motivated and wife present throughout session. Lengthy discussion re: discharge planning options and the fact that it in part depends on how pt is tolerating increased activity and his ability to tolerate 1-3 hours of therapy per day (depending on post-acute venue). PT Plan: Frequency remains appropriate;Discharge plan needs to be updated PT Frequency: Min 3X/week Recommendations for Other Services: Rehab consult Follow Up Recommendations: Inpatient Rehab;LTACH (may need transition to [facility] and then Rehab as endurance inc) Equipment Recommended: Defer to next venue PT Goals  Acute Rehab PT Goals PT Goal: Supine/Side to Sit - Progress: Progressing toward goal PT Goal: Sit at Edge Of Bed - Progress: Progressing toward goal PT Goal: Sit to Supine/Side - Progress: Progressing toward goal PT Goal: Sit to Stand - Progress: Progressing toward goal PT Goal: Stand to Sit - Progress: Progressing toward goal  PT Treatment Precautions/Restrictions  Precautions Precautions: Back;Fall Precaution Booklet Issued: No Precaution Comments: requires max cues to maintain back precautions. Required Braces or Orthoses DO NOT USE: No Restrictions Weight Bearing Restrictions: No Other Position/Activity Restrictions: no bend arch twist per back precautions Mobility (including Balance) Bed Mobility Rolling Right: 1: +2 Total assist;With rail;Patient percentage (comment) Rolling Right Details (indicate cue type and reason): pt able to flex knees without assist, required assist to maintain knees flexed and to initiate rolling to Rt, assist with bed pad, pt= 25% Right Sidelying to Sit: 1: +2 Total assist;Patient percentage (comment);HOB elevated (comment degrees);With rails Right Sidelying to Sit Details (indicate  cue type and reason): each leg elevated to reduce friction and pt then able to extend knee/kick leg over EOB, assist to raise torso due to weakness, pt = 50%, HOB 30 Sitting - Scoot to Edge of Bed: 1: +2 Total assist Sitting - Scoot to Edge of Bed Details (indicate cue type and reason): use of bed pad, +1 to stabilize trunk and 2nd person to scoot hips forward Sit to Sidelying Right: 1: +2 Total assist;Patient percentage (comment);HOB flat;With rail Sit to Sidelying Right Details (indicate cue type and reason): pt required max cues and assist to maintain back precautions (esp no twisting) as he tries to reach behind himself and transition sit to supine (vs sidelying) Transfers Transfers: Yes Lateral/Scoot Transfers: 1: +2 Total assist;Patient percentage (comment);From elevated surface Lateral/Scoot Transfer Details (indicate cue type and reason): scooting to pt's rt towards HOB while sitting EOB; Pt able to bear weight through RLE minimally to assist with transfer; scooted x 5 Ambulation/Gait Ambulation/Gait: No  Posture/Postural Control Posture/Postural Control: Postural limitations Postural Limitations: Pt able to sit upright at EOB with minimal assistance (guarding) with moderate cues and bil UE support Balance Balance Assessed: Yes Static Sitting Balance Static Sitting - Balance Support: Bilateral upper extremity supported;Feet supported Static Sitting - Level of Assistance: 3: Mod assist Static Sitting - Comment/# of Minutes: EOB 15 minutes total with varying assist minguard assist up to mod assist with increasing fatigue; pt holding head erect with cues Exercise  General Exercises - Lower Extremity Ankle Circles/Pumps: AAROM;Right;Left;5 reps;Seated (Lt supinates as dorsiflexes (peroneal weakness)) Long Arc Quad: AROM;Right;5 reps;Seated (Lt PROM, unable to maintain knee extension eccentrically) Hip Flexion/Marching: AROM;Both;5 reps;Seated End of Session PT - End of Session Activity  Tolerance: Patient limited by fatigue (and weakness) Patient left: in bed;with call bell in reach;with family/visitor present (physician) Nurse Communication: Mobility status for transfers;Need  for lift equipment General Behavior During Session: [facility] for tasks performed Cognition: Impaired Cognitive Impairment: slow processing, however remained on task  Raquelle Pietro 06/05/2011, 11:01 AM Pager [phone]

## 2011-06-06 LAB — GLUCOSE, CAPILLARY
Glucose-Capillary: 203 mg/dL — ABNORMAL HIGH (ref 70–99)
Glucose-Capillary: 135 mg/dL — ABNORMAL HIGH (ref 70–99)
Glucose-Capillary: 282 mg/dL — ABNORMAL HIGH (ref 70–99)
Glucose-Capillary: 202 mg/dL — ABNORMAL HIGH (ref 70–99)
Glucose-Capillary: 212 mg/dL — ABNORMAL HIGH (ref 70–99)

## 2011-06-06 LAB — BASIC METABOLIC PANEL
GFR calc Af Amer: 80 mL/min — ABNORMAL LOW (ref >90)
GFR calc non Af Amer: 69 mL/min — ABNORMAL LOW (ref >90)
Potassium: 3.8 mEq/L (ref 3.5–5.1)
Sodium: 148 mEq/L — ABNORMAL HIGH (ref 135–145)

## 2011-06-06 LAB — CBC
Hemoglobin: 8.5 g/dL — ABNORMAL LOW (ref 13.0–17.0)
RBC: 3.85 MIL/uL — ABNORMAL LOW (ref 4.22–5.81)
WBC: 8.9 10*3/uL (ref 4.0–10.5)

## 2011-06-06 MED ORDER — BIOTENE DRY MOUTH MT LIQD
1.0000 "application " | Freq: Two times a day (BID) | OROMUCOSAL | Status: DC
  Administered 2011-06-07 – 2011-06-08 (×3): 15 mL via OROMUCOSAL

## 2011-06-06 MED ORDER — PANTOPRAZOLE SODIUM 40 MG PO TBEC
40.0000 mg | DELAYED_RELEASE_TABLET | Freq: Two times a day (BID) | ORAL | Status: DC
  Administered 2011-06-07 – 2011-06-08 (×3): 40 mg via ORAL
  Filled 2011-06-06 (×3): qty 1

## 2011-06-06 MED ORDER — VILAZODONE HCL 20 MG PO TABS
20.0000 mg | ORAL_TABLET | Freq: Every day | ORAL | Status: DC
  Filled 2011-06-06: qty 1

## 2011-06-06 MED ORDER — TAMSULOSIN HCL 0.4 MG PO CAPS
0.4000 mg | ORAL_CAPSULE | Freq: Every day | ORAL | Status: DC
  Administered 2011-06-06 – 2011-06-08 (×3): 0.4 mg via ORAL
  Filled 2011-06-06 (×4): qty 1

## 2011-06-06 MED ORDER — VILAZODONE HCL 40 MG PO TABS: 20.0000 mg | ORAL_TABLET | Freq: Every day | ORAL | Status: DC

## 2011-06-06 NOTE — Progress Notes (Signed)
Patient ID: John [name], male   DOB: 15-Apr-1940, 71 y.o.   MRN: [id number] S:no new complaints O:BP 149/76  Pulse 96  Temp(Src) 97.5 F (36.4 C) (Oral)  Resp 28  Ht 6\' 2"  (1.88 m)  Wt 97.2 kg (214 lb 4.6 oz)  BMI 27.51 kg/m2  SpO2 96%  Intake/Output Summary (Last 24 hours) at 06/06/11 0935 Last data filed at 06/06/11 0800  Gross per 24 hour  Intake 1142.3 ml  Output   2565 ml  Net -1422.7 ml   Weight change: 0.7 kg (1 lb 8.7 oz) Gen:WD WN WM in NAD CVS:RRR Resp:CTA WUJ:[id number] Ext:no pretib edema   Lab 06/06/11 0600 06/05/11 0315 06/04/11 0410 06/03/11 0830 06/03/11 0320 06/02/11 0334 06/01/11 0330 05/31/11 0436  NA 148* 143 142 146* 139 144 145 --  K 3.8 4.2 3.8 3.6 3.8 3.5 3.7 --  CL 112 111 108 110 104 109 111 --  CO2 28 24 26 26 27 29 29  --  GLUCOSE 160* 192* 275* 197* 319* 155* 217* --  BUN 22 27* 29* 26* 26* 30* 30* --  CREATININE 1.05 1.02 0.89 0.96 0.94 0.84 0.90 --  ALB -- -- -- -- -- -- -- --  CALCIUM 9.7 9.2 9.5 9.4 9.4 9.3 9.3 --  PHOS -- 1.8* 3.0 2.1* 2.2* 3.0 2.7 2.4  AST -- -- -- -- -- -- 25 --  ALT -- -- -- -- -- -- 10 --   Liver Function Tests:  Lab 06/05/11 0315 06/04/11 0410 06/03/11 0830 06/01/11 0330  AST -- -- -- 25  ALT -- -- -- 10  ALKPHOS -- -- -- 92  BILITOT -- -- -- 0.6  PROT -- -- -- 5.6*  ALBUMIN 2.0* 2.1* 2.0* --   No results found for this basename: LIPASE:3,AMYLASE:3 in the last 168 hours No results found for this basename: AMMONIA:3 in the last 168 hours CBC:  Lab 06/06/11 0600 06/05/11 0315 06/04/11 0410 06/03/11 0830 06/03/11 0320  WBC 8.9 11.5* 7.7 -- --  NEUTROABS -- -- -- -- --  HGB 8.5* 7.9* 7.9* -- --  HCT 27.0* 25.7* 25.3* -- --  MCV 70.1* 68.9* 70.1* 69.9* 69.7*  PLT 203 197 146* -- --   Cardiac Enzymes: No results found for this basename: CKTOTAL:5,CKMB:5,CKMBINDEX:5,TROPONINI:5 in the last 168 hours CBG:  Lab 06/06/11 0727 06/06/11 0406 06/05/11 2354 06/05/11 1912 06/05/11 1643  GLUCAP 135* 203* 161*  260* 243*    Iron Studies: No results found for this basename: IRON,TIBC,TRANSFERRIN,FERRITIN in the last 72 hours Studies/Results: No results found.    Marland Kitchen albuterol  2.5 mg Nebulization Q6H  . antiseptic oral rinse  1 application Mouth Rinse QID  . aspirin  81 mg Per Tube Daily  . calcium carbonate  2 tablet Per Tube Daily  . carbidopa-levodopa  1 tablet Per Tube TID  . fentaNYL  50 mcg Transdermal Q72H  . ferumoxytol  510 mg Intravenous Once  . furosemide  80 mg Oral BID  . insulin aspart  0-15 Units Subcutaneous Q4H  . insulin glargine  15 Units Subcutaneous Daily  . ipratropium  0.5 mg Nebulization Q6H  . lamoTRIgine  200 mg Oral Daily  . mycophenolate  500 mg Oral BID  . pantoprazole sodium  40 mg Per Tube BID  . predniSONE  5 mg Oral Q breakfast  . pyridostigmine  30 mg Oral TID  . QUEtiapine  100 mg Oral QHS  . sodium chloride  10-40 mL Intracatheter Q12H  .  tacrolimus  5 mg Oral BID  . Vilazodone HCl  20 mg Oral Daily  . DISCONTD: insulin glargine  10 Units Subcutaneous Daily    BMET    Component Value Date/Time   NA 148* 06/06/2011 0600   K 3.8 06/06/2011 0600   CL 112 06/06/2011 0600   CO2 28 06/06/2011 0600   GLUCOSE 160* 06/06/2011 0600   BUN 22 06/06/2011 0600   CREATININE 1.05 06/06/2011 0600   CALCIUM 9.7 06/06/2011 0600   GFRNONAA 69* 06/06/2011 0600   GFRAA 80* 06/06/2011 0600   CBC    Component Value Date/Time   WBC 8.9 06/06/2011 0600   RBC 3.85* 06/06/2011 0600   HGB 8.5* 06/06/2011 0600   HCT 27.0* 06/06/2011 0600   PLT 203 06/06/2011 0600   MCV 70.1* 06/06/2011 0600   MCH 22.1* 06/06/2011 0600   MCHC 31.5 06/06/2011 0600   RDW 21.0* 06/06/2011 0600   LYMPHSABS 1.7 05/24/2011 0250   MONOABS 1.3* 05/24/2011 0250   EOSABS 0.5 05/24/2011 0250   BASOSABS 0.0 05/24/2011 0250     Assessment/Plan:  1. Renal transplant- good allograft function s/p LRD July 2002 at [facility]. Cont with meds.  Slight increase in creatinine, possibly due to diuresis (-1.5L).  Need to check  prograf level in am 2. VDRF- extubated ?etiol of pleural effusions 3. ABLA- transfuse as needed. Baseline Hgb was 11.3 prior to surgery on 05/16/11. Follow 4. Bipolar disorder- resume meds and consider psych eval 5. htn- stable 6. DJD s/p lumbar surgery 7. Deconditioning- PT/OT await CIR eval Dennys Traughber A

## 2011-06-06 NOTE — Procedures (Signed)
Objective Swallowing Evaluation: Fiberoptic Endoscopic Evaluation of Swallowing  Patient Details  Name: John [name] MRN: [id number] Date of Birth: 1940/07/15  Today's Date: 06/06/2011 Time:  -     Past Medical History:  Past Medical History  Diagnosis Date  . CAD (coronary artery disease)     a. myoview 1/11: lg inf fixed defect;   b. cath 1/11: 3v CAD,  c. s/p CABG 1/11 c/b acute renal failure and AFib  . Atrial fibrillation   . Chronic diastolic heart failure     a. echo 4/11: EF 45-50%, mod LVH, LAE, inf and septal HK  . First degree AV block   . Orthostatic hypotension     pyridostigmine  . History of renal transplant   . DM2 (diabetes mellitus, type 2)   . Diabetic gastroparesis   . HTN (hypertension)   . HLD (hyperlipidemia)   . PAD (peripheral artery disease)     s/p R SFA-Pop atherectomy 11/11  . Prostate cancer   . ED (erectile dysfunction)   . DDD (degenerative disc disease), lumbar   . Tremor   . Obesity   . Low testosterone   . Chronic kidney disease     s/p renal transplant 2002  . Pleural effusion, right     loculated  . COPD (chronic obstructive pulmonary disease)   . Anxiety   . Diabetes mellitus     insulin pump   Past Surgical History:  Past Surgical History  Procedure Date  . Coronary artery bypass graft 03/2009  . Cataract extraction   . Vitrectomy   . Tonsillectomy   . Kidney transplant 2002  . Hernia repair   . Prostate cancer rx 2006 brachytherapy   . Prior peritoneal catheter placement   . Lumbar laminectomy/decompression microdiscectomy 05/16/2011    Procedure: LUMBAR LAMINECTOMY/DECOMPRESSION MICRODISCECTOMY 1 LEVEL;  Surgeon: Barnett Abu, MD;  Location: [facility];  Service: Neurosurgery;  Laterality: Bilateral;  Bilateral Lumbar Five-Sacral One Laminectomy   HPI:  Pt is a 71 year old male who underwent back surgery and developed respiratory distress requiring intubation post-op. Pt was intubated 4/3 - 4/12 and is tolerating  trial extubation well at this point. Pt has a history of acutre reversible dysphagia s/p CABG operation in 2011 requiring a couple of weeks of thickened liquids due to silent aspiration.  Pt shoed overt s/s of aspiration at bedside, put on honey thick liuqidus, with FEEES recommended to determine best diet.  Other PMH: COPD, DM, CHF     Recommendation/Prognosis   Dysphagia 3 (Mechanical Soft); Thin liquid  Liquid Administration via Cup; No straw  Medication Administration  Whole meds with puree  Supervision Full supervision/cueing for compensatory strategies; Patient able to self feed Compensations Slow rate; Small sips/bites; Seated upright 90 degrees; Follow up Recommendations Inpatient Rehab Inpatient Rehab  Plan: Speech Therapy Frequency min 2x/week min 2x/week Duration 2 weeks Treatment/Interventions Compensatory strategies; Patient/family education; SLP instruction and feedback         SLP Assessment/Plan  Pt presents with a primary sensory based pharyngeal dysphagia with a delay in swallow initiation to the pyriform sinuses with most liquid consistencies. Possible trace penetration/aspraition with large straw sips of thin liquids. Pt may intiate a dysphagia 3 (due to missing dentition) diet with thin liquids with aspraition precautions.    SLP Goals   Patient will consume recommended diet without observed clinical signs of aspiration with Moderate assistance- Progress Progressing toward goa l Patient will utilize recommended strategies during swallow to increase  swallowing safety with Minimal cueing    John [name], John [name] 06/06/2011, 3:34 PM

## 2011-06-06 NOTE — Progress Notes (Signed)
I met with patient, wife, and daughter at bedside. Patient is appropriate for an inpatient acute rehabilitation stay once his tolerance with therapy improves over the next few days. I have encouraged patient to be up in the chair daily even with lift of nursing staff. Patient previously in CIR 2011 and venue expectations are familiar to he and his family. Wife is requesting physicians to please review his previous home medications to clarify current regimen to maximize his capabilities to mobilize. He previously had issues with orthostatic hypotension and was on mestinon which was very helpful. I will follow up with his progress daily. Please call [phone] with questions.

## 2011-06-06 NOTE — Progress Notes (Signed)
HPI: 71 yo John [name] with hx COPD, renal transplant (2002), DM on insulin pump who presented 3/26 for lumbar laminotomies and decompression L5 and S1 r/t spondylosis and herniated nucleus pulposus. Post op developed episodes of confusion and respiratory distress thought r/t CHF Which initially improved with lasix. On 3/31 developed worsening respiratory distress and hypoxia and PCCM consulted.   Antibiotics: (per ID) Vanc 3/31 >> 4/10 Zosyn 3/31 >> 4/10 micofungin 4/7 >> 4/10  Cultures/Sepsis Markers:   Urine 3/29>>> MORGANELLA MORGANII (6,000 COLONIES/ML)  BCx2 3/28>>> negative  Sputum 3/31>>>neg  Resp 4/3 >> rare candida Blood 4/3 >> NEG Blood 4/5 >> NEG Blood 4/13 >>ng Urine 4/13 >>ng   Access/Protocols:  L IJ CVL 3/31 >> 4/12 ET tube 4/3 >> 4/12 LUE PICC 4/12 >>>  Best Practice:  DVT: SCDs GI: Pantoprazole   STUDIES: CT CHEST 05/27/11 Small left pleural effusion. Moderate sized right pleural effusion. Bilateral lower lobe posterior atelectasis or infiltrate. Mild emphysematous changes bilateral upper lobe Thoracentesis 4/11: 400 cc removed by IR.  Borderline exudate by chemistries  Subjective: Calm and cooperative, Still having BM frequently. Had a decent sleep last night, NG in place and asked me about the swallow study and the use of it.  Afebrile   Physical Exam: Filed Vitals:   06/06/11 0730  BP:   Pulse:   Temp: 97.5 F (36.4 C)  Resp:     Intake/Output Summary (Last 24 hours) at 06/06/11 0825 Last data filed at 06/06/11 0700  Gross per 24 hour  Intake 1154.6 ml  Output   2485 ml  Net -1330.4 ml      General: chronically ill appearing John [name], Neuro: No focal deficits, follows commands , more lucid, mobilising with PT CV: RRR s M.  PULM: Slightly coarse. No wheezes GI: abd soft, +bs.  Extremities: Warm and dry, 1+ BLE edema. Foot boots in place  Labs: CBC:    Component Value Date/Time   WBC 11.5* 06/05/2011 0315   HGB 7.9* 06/05/2011 0315   HCT  25.7* 06/05/2011 0315   PLT 197 06/05/2011 0315   MCV 68.9* 06/05/2011 0315   NEUTROABS 9.1* 05/24/2011 0250   LYMPHSABS 1.7 05/24/2011 0250   MONOABS 1.3* 05/24/2011 0250   EOSABS 0.5 05/24/2011 0250   BASOSABS 0.0 05/24/2011 0250     Comprehensive Metabolic Panel:    Component Value Date/Time   NA 148* 06/06/2011 0600   K 3.8 06/06/2011 0600   CL 112 06/06/2011 0600   CO2 28 06/06/2011 0600   BUN 22 06/06/2011 0600   CREATININE 1.05 06/06/2011 0600   GLUCOSE 160* 06/06/2011 0600   CALCIUM 9.7 06/06/2011 0600   AST 25 06/01/2011 0330   ALT 10 06/01/2011 0330   ALKPHOS 92 06/01/2011 0330   BILITOT 0.6 06/01/2011 0330   PROT 5.6* 06/01/2011 0330   ALBUMIN 2.0* 06/05/2011 0315   ABG   Lab 06/05/11 0315  MG 1.7   Lab Results  Component Value Date   CALCIUM 9.7 06/06/2011   PHOS 1.8* 06/05/2011   pCRX-4/12 - BL effusions rt >> lt  Assessment & Plan:  Acute respiratory failure  Extubated 4/12 -has good cough & can maintain airway   Encephalopathy  ?etiol -delerium, Hx of Bipolar on multiple neurogenic meds, depression Taper  Precedex to off , frequent re-orientation etc FEES on 4/16 -use panda, advance PO if clear Resume psychotropic meds incl seroquel, lamictal ,monitor qT, hold risperdal (parkinson's) Resume parkinson's meds - sinemet etc Psych input CIR agrees to take  the patient after discussion with family   DM  Increase lantus to 15 u,  Since back on TFs, Cont SSI   Chronic renal insufficiency s/p renal transplant 2002  Renal following. On Lasix per renal - Cont cellcept, prograf.  - continue free water   Fever/Leukocytosis  -Per ID,Now off all ABX per ID.febrile 4/13 -CVL changed to PICC Line. -cx neg, Low pct 4/13 reassuring   Lower GI bleed/diarrhea - RN reports passing clots 4/13, Hb stable, no heparin, diarrhea likely related to bleed. Cont to monitor for now. C diff PCR if continues to have BM in the setting of recent antibiotic use.   Chronic bilateral pleural  effusions R>L   Thoracentesis 4/11 - 400 cc of borderline exudate by chemistries   Best practices / Disposition  -->Trnasfer to SDU status under PCCM  -->SCD's for DVT Px  -->Protonix for GI Px   Lars Mage MD [facility] Resident Pager [phone]  Independently examined pt, evaluated data & formulated above care plan with resident  [facility] V.

## 2011-06-07 DIAGNOSIS — M549 Dorsalgia, unspecified: Secondary | ICD-10-CM

## 2011-06-07 LAB — CBC
Hemoglobin: 8.1 g/dL — ABNORMAL LOW (ref 13.0–17.0)
MCH: 21.7 pg — ABNORMAL LOW (ref 26.0–34.0)
RBC: 3.73 MIL/uL — ABNORMAL LOW (ref 4.22–5.81)

## 2011-06-07 LAB — GLUCOSE, CAPILLARY
Glucose-Capillary: 190 mg/dL — ABNORMAL HIGH (ref 70–99)
Glucose-Capillary: 204 mg/dL — ABNORMAL HIGH (ref 70–99)

## 2011-06-07 LAB — RENAL FUNCTION PANEL
CO2: 27 mEq/L (ref 19–32)
Chloride: 109 mEq/L (ref 96–112)
Creatinine, Ser: 0.91 mg/dL (ref 0.50–1.35)
GFR calc Af Amer: >90 mL/min (ref >90)
GFR calc non Af Amer: 83 mL/min — ABNORMAL LOW (ref >90)

## 2011-06-07 MED ORDER — INSULIN ASPART 100 UNIT/ML ~~LOC~~ SOLN
0.0000 [IU] | Freq: Three times a day (TID) | SUBCUTANEOUS | Status: DC
  Administered 2011-06-07 (×2): 4 [IU] via SUBCUTANEOUS
  Administered 2011-06-07: 7 [IU] via SUBCUTANEOUS
  Administered 2011-06-08: 4 [IU] via SUBCUTANEOUS
  Administered 2011-06-08: 7 [IU] via SUBCUTANEOUS

## 2011-06-07 NOTE — Progress Notes (Signed)
Met with patient and wife at bedside. Patient appropriate for CIR admit and bed is available tomorrow. Please call [phone] with questions.

## 2011-06-07 NOTE — Progress Notes (Addendum)
Patient ID: John [name], male   DOB: 1940-10-15, 71 y.o.   MRN: [id number] S:no new complaints O:BP 165/76  Pulse 96  Temp(Src) 97.3 F (36.3 C) (Oral)  Resp 21  Ht 6\' 2"  (1.88 m)  Wt 97.2 kg (214 lb 4.6 oz)  BMI 27.51 kg/m2  SpO2 100%  Intake/Output Summary (Last 24 hours) at 06/07/11 1005 Last data filed at 06/07/11 0900  Gross per 24 hour  Intake    780 ml  Output    840 ml  Net    -60 ml   Weight change:  Gen:WD WN WM in NAD  CVS:RRR  Resp:CTA  WUJ:WJXBJY  Ext:no pretib edema    Lab 06/07/11 0315 06/06/11 0600 06/05/11 0315 06/04/11 0410 06/03/11 0830 06/03/11 0320 06/02/11 0334 06/01/11 0330  NA 143 148* 143 142 146* 139 144 --  K 4.0 3.8 4.2 3.8 3.6 3.8 3.5 --  CL 109 112 111 108 110 104 109 --  CO2 27 28 24 26 26 27 29  --  GLUCOSE 179* 160* 192* 275* 197* 319* 155* --  BUN 16 22 27* 29* 26* 26* 30* --  CREATININE 0.91 1.05 1.02 0.89 0.96 0.94 0.84 --  ALB -- -- -- -- -- -- -- --  CALCIUM 9.1 9.7 9.2 9.5 9.4 9.4 9.3 --  PHOS 2.8 -- 1.8* 3.0 2.1* 2.2* 3.0 2.7  AST -- -- -- -- -- -- -- 25  ALT -- -- -- -- -- -- -- 10   Liver Function Tests:  Lab 06/07/11 0315 06/05/11 0315 06/04/11 0410 06/01/11 0330  AST -- -- -- 25  ALT -- -- -- 10  ALKPHOS -- -- -- 92  BILITOT -- -- -- 0.6  PROT -- -- -- 5.6*  ALBUMIN 2.0* 2.0* 2.1* --   No results found for this basename: LIPASE:3,AMYLASE:3 in the last 168 hours No results found for this basename: AMMONIA:3 in the last 168 hours CBC:  Lab 06/07/11 0315 06/06/11 0600 06/05/11 0315 06/04/11 0410 06/03/11 0830  WBC 8.2 8.9 11.5* -- --  NEUTROABS -- -- -- -- --  HGB 8.1* 8.5* 7.9* -- --  HCT 26.1* 27.0* 25.7* -- --  MCV 70.0* 70.1* 68.9* 70.1* 69.9*  PLT 243 203 197 -- --   Cardiac Enzymes: No results found for this basename: CKTOTAL:5,CKMB:5,CKMBINDEX:5,TROPONINI:5 in the last 168 hours CBG:  Lab 06/07/11 0742 06/07/11 0021 06/06/11 1938 06/06/11 1644 06/06/11 1234  GLUCAP 190* 239* 212* 202* 282*     Iron Studies: No results found for this basename: IRON,TIBC,TRANSFERRIN,FERRITIN in the last 72 hours Studies/Results: No results found.    Marland Kitchen albuterol  2.5 mg Nebulization Q6H  . antiseptic oral rinse  1 application Mouth Rinse BID  . aspirin  81 mg Per Tube Daily  . calcium carbonate  2 tablet Per Tube Daily  . carbidopa-levodopa  1 tablet Per Tube TID  . fentaNYL  50 mcg Transdermal Q72H  . insulin aspart  0-20 Units Subcutaneous TID WC  . insulin glargine  15 Units Subcutaneous Daily  . ipratropium  0.5 mg Nebulization Q6H  . lamoTRIgine  200 mg Oral Daily  . mycophenolate  500 mg Oral BID  . pantoprazole  40 mg Oral BID AC  . predniSONE  5 mg Oral Q breakfast  . pyridostigmine  30 mg Oral TID  . QUEtiapine  100 mg Oral QHS  . sodium chloride  10-40 mL Intracatheter Q12H  . tacrolimus  5 mg Oral BID  . Tamsulosin  HCl  0.4 mg Oral Daily  . Vilazodone HCl  20 mg Oral Daily  . DISCONTD: antiseptic oral rinse  1 application Mouth Rinse QID  . DISCONTD: insulin aspart  0-15 Units Subcutaneous Q4H  . DISCONTD: pantoprazole sodium  40 mg Per Tube BID  . DISCONTD: Vilazodone HCl  20 mg Oral Daily  . DISCONTD: Vilazodone HCl  20 mg Oral Daily    BMET    Component Value Date/Time   NA 143 06/07/2011 0315   K 4.0 06/07/2011 0315   CL 109 06/07/2011 0315   CO2 27 06/07/2011 0315   GLUCOSE 179* 06/07/2011 0315   BUN 16 06/07/2011 0315   CREATININE 0.91 06/07/2011 0315   CALCIUM 9.1 06/07/2011 0315   GFRNONAA 83* 06/07/2011 0315   GFRAA >90 06/07/2011 0315   CBC    Component Value Date/Time   WBC 8.2 06/07/2011 0315   RBC 3.73* 06/07/2011 0315   HGB 8.1* 06/07/2011 0315   HCT 26.1* 06/07/2011 0315   PLT 243 06/07/2011 0315   MCV 70.0* 06/07/2011 0315   MCH 21.7* 06/07/2011 0315   MCHC 31.0 06/07/2011 0315   RDW 21.0* 06/07/2011 0315   LYMPHSABS 1.7 05/24/2011 0250   MONOABS 1.3* 05/24/2011 0250   EOSABS 0.5 05/24/2011 0250   BASOSABS 0.0 05/24/2011 0250     Assessment/Plan:   1. Renal transplant- good allograft function s/p LRD July 2002 at [facility]. Cont with meds. Prograf level pending 2. VDRF- extubated ?etiol of pleural effusions 3. ABLA- transfuse as needed. Baseline Hgb was 11.3 prior to surgery on 05/16/11. Follow 4. Bipolar disorder- resume meds and consider psych eval 5. htn- stable 6. DJD s/p lumbar surgery 7. Deconditioning- PT/OT await CIR eval 8. Dispo- nothing further to add.  Will sign off for now, please call with questions or concerns 9. Follow up with Dr. [name] in our office within 4 weeks of discharge  Adea Geisel A

## 2011-06-07 NOTE — Progress Notes (Signed)
eLink Physician-Brief Progress Note Patient Name: John [name] DOB: 07/26/1940 MRN: [id number]  Date of Service  06/07/2011   HPI/Events of Note     eICU Interventions  Changed ICU HG protocol to standard protocol   Intervention Category Intermediate Interventions: Hyperglycemia - evaluation and treatment  Drakkar Medeiros,Sarp S. 06/07/2011, 12:37 AM

## 2011-06-07 NOTE — Progress Notes (Signed)
Speech Language Pathology Dysphagia Treatment  Patient Details Name: John [name] MRN: [id number] DOB: 1940/09/17 Today's Date: 06/07/2011  SLP Assessment/Plan/Recommendation Assessment / Recommendations / Plan Clinical Impression Statement: Pt presents with overt s/s of aspiration with cup sips of thin liquids due to signs of delay in swallow intiation. SLP provided moderate tactile and verbal cues for small sips. coughing present in 2-5 trials. Pt has refused solid foods. SLp discussed risk with pt and family and reported concern to RN. If family/RN note further difficulty today will consider downgrade to nectar thick liquids tomorrow.  Continue with Current Diet: Dysphagia 3 (mechanical soft);Thin liquid Liquids provided via: Cup Medication Administration: Whole meds with puree Supervision: Full supervision/cueing for compensatory strategies;Patient able to self feed Compensations: Slow rate;Small sips/bites Postural Changes and/or Swallow Maneuvers: Seated upright 90 degrees Oral Care Recommendations: Oral care QID Swallowing Goals  SLP Swallowing Goals Patient will consume recommended diet without observed clinical signs of aspiration with: Moderate assistance Swallow Study Goal #1 - Progress: Progressing toward goal Patient will utilize recommended strategies during swallow to increase swallowing safety with: Minimal cueing Swallow Study Goal #2 - Progress: Progressing toward goal  General Temperature Spikes Noted: No Respiratory Status: Supplemental O2 delivered via (comment) Behavior/Cognition: Alert;Cooperative (anxious) Oral Cavity - Dentition: Dentures, bottom;Dentures, top Patient Positioning: Upright in bed  Oral Cavity - Oral Hygiene Does patient have any of the following "at risk" factors?: Saliva - thick, dry mouth   Dysphagia Treatment Treatment focused on: Skilled observation of diet tolerance;Patient/family/caregiver education;Facilitation of pharyngeal  phase Family/Caregiver Educated: wife Treatment Methods/Modalities: Differential diagnosis;Skilled observation Patient observed directly with PO's: Yes Type of PO's observed: Thin liquids Feeding: Needs assist Liquids provided via: Cup Oral Phase Signs & Symptoms: Anterior loss/spillage Pharyngeal Phase Signs & Symptoms: Suspected delayed swallow initiation;Delayed cough Type of cueing: Verbal Amount of cueing: Moderate   Harlon Ditty, [place] CCC-SLP [phone]  Claudine Mouton 06/07/2011, 11:31 AM

## 2011-06-07 NOTE — Progress Notes (Signed)
HPI: 71 yo male with hx COPD, renal transplant (2002), DM on insulin pump who presented 3/26 for lumbar laminotomies and decompression L5 and S1 r/t spondylosis and herniated nucleus pulposus. Post op developed episodes of confusion and respiratory distress thought r/t CHF Which initially improved with lasix. On 3/31 developed worsening respiratory distress and hypoxia and PCCM consulted.   Antibiotics: (per ID) Vanc 3/31 >> 4/10 Zosyn 3/31 >> 4/10 micofungin 4/7 >> 4/10  Cultures/Sepsis Markers:   Urine 3/29>>> MORGANELLA MORGANII (6,000 COLONIES/ML)  BCx2 3/28>>> negative  Sputum 3/31>>>neg  Resp 4/3 >> rare candida Blood 4/3 >> NEG Blood 4/5 >> NEG Blood 4/13 >>ng Urine 4/13 >>ng   Access/Protocols:  L IJ CVL 3/31 >> 4/12 ET tube 4/3 >> 4/12 LUE PICC 4/12 >>>  Best Practice:  DVT: SCDs GI: Pantoprazole   STUDIES: CT CHEST 05/27/11 Small left pleural effusion. Moderate sized right pleural effusion. Bilateral lower lobe posterior atelectasis or infiltrate. Mild emphysematous changes bilateral upper lobe Thoracentesis 4/11: 400 cc removed by IR.  Borderline exudate by chemistries  Subjective: Calm and cooperative, having BM x2 / shift, oob to chair  Afebrile   Physical Exam: Filed Vitals:   06/07/11 0900  BP: 165/76  Pulse: 96  Temp:   Resp: 21    Intake/Output Summary (Last 24 hours) at 06/07/11 1120 Last data filed at 06/07/11 1100  Gross per 24 hour  Intake    780 ml  Output    840 ml  Net    -60 ml      General: chronically ill appearing male, Neuro: No focal deficits, follows commands , more lucid, mobilising with PT CV: RRR s M.  PULM: Slightly coarse. No wheezes GI: abd soft, +bs.  Extremities: Warm and dry, 1+ BLE edema.   Labs: CBC:    Component Value Date/Time   WBC 8.2 06/07/2011 0315   HGB 8.1* 06/07/2011 0315   HCT 26.1* 06/07/2011 0315   PLT 243 06/07/2011 0315   MCV 70.0* 06/07/2011 0315   NEUTROABS 9.1* 05/24/2011 0250   LYMPHSABS 1.7  05/24/2011 0250   MONOABS 1.3* 05/24/2011 0250   EOSABS 0.5 05/24/2011 0250   BASOSABS 0.0 05/24/2011 0250     Comprehensive Metabolic Panel:    Component Value Date/Time   NA 143 06/07/2011 0315   K 4.0 06/07/2011 0315   CL 109 06/07/2011 0315   CO2 27 06/07/2011 0315   BUN 16 06/07/2011 0315   CREATININE 0.91 06/07/2011 0315   GLUCOSE 179* 06/07/2011 0315   CALCIUM 9.1 06/07/2011 0315   AST 25 06/01/2011 0330   ALT 10 06/01/2011 0330   ALKPHOS 92 06/01/2011 0330   BILITOT 0.6 06/01/2011 0330   PROT 5.6* 06/01/2011 0330   ALBUMIN 2.0* 06/07/2011 0315   ABG   Lab 06/05/11 0315  MG 1.7   Lab Results  Component Value Date   CALCIUM 9.1 06/07/2011   PHOS 2.8 06/07/2011   pCRX-4/12 - BL effusions rt >> lt  Assessment & Plan:  Acute respiratory failure  Extubated 4/12 -has good cough & can maintain airway   Encephalopathy  ?etiol -delerium, Hx of Bipolar on multiple neurogenic meds, depression Taper  Precedex to off , frequent re-orientation etc FEES on 4/16 -dys 3 diet Resumed psychotropic meds incl seroquel, lamictal ,monitor qT, hold risperdal (parkinson's) Resumed parkinson's meds - sinemet etc Psych input CIR agrees to take the patient after discussion with family   DM   lantus at 15 u,  Cont SSI  Chronic renal insufficiency s/p renal transplant 2002  Renal following. On Lasix per renal - Cont cellcept, prograf.  - continue free water , Na improved  Fever/Leukocytosis  -Per ID,Now off all ABX per ID.febrile 4/13 -CVL changed to PICC Line. -cx neg, Low pct 4/13 reassuring   Lower GI bleed/diarrhea - RN reports passing clots 4/13, Hb stable, no heparin Cont to monitor for now. C diff PCR if continues to have BM in the setting of recent antibiotic use.   Chronic bilateral pleural effusions R>L   Thoracentesis 4/11 - 400 cc of borderline exudate by chemistries   Best practices / Disposition  -->Trnasfer to SDU status under PCCM , await rehab approval -->SCD's for DVT  Px  -->Protonix for GI Px    Jeanmarie Mccowen V.

## 2011-06-07 NOTE — Progress Notes (Signed)
Physical Therapy Treatment Patient Details Name: John [name] MRN: [id number] DOB: 1941-01-29 Today's Date: 06/07/2011  PT Assessment/Plan Comments on Treatment Session: Pt remains motivated and tolerated significantly increased activity today with standing attempts x 4 and stood up to 4 minutes at a time. LE weakness continues to be a primary problem (including apparent foot drop on Rt and peroneal weakness on Lt) PT Plan: Discharge plan remains appropriate;Frequency remains appropriate PT Frequency: Min 3X/week Follow Up Recommendations: Inpatient Rehab Equipment Recommended: Defer to next venue PT Goals  Acute Rehab PT Goals PT Goal: Sit to Stand - Progress: Progressing toward goal PT Goal: Stand to Sit - Progress: Progressing toward goal PT Goal: Stand - Progress: Progressing toward goal  PT Treatment Precautions/Restrictions  Precautions Precautions: Back;Fall Precaution Booklet Issued: No Precaution Comments: requires max cues to maintain back precautions. Required Braces or Orthoses DO NOT USE: No Restrictions Weight Bearing Restrictions: No Other Position/Activity Restrictions: no bend arch twist per back precautions Mobility (including Balance) Bed Mobility Bed Mobility: No Transfers Sit to Stand: 1: +2 Total assist;With upper extremity assist;With armrests;From chair/3-in-1;Patient percentage (comment) Sit to Stand Details (indicate cue type and reason): with pad under pelvis, pt=50% able to clear buttocks off chair to ~16" and pt able to transition Lt hand onto PT's forearm, however could not extend hips to achieve upright posture; then used Sara-Plus to stand x3 (stood 4 minutes, 3 minutes, 3 minutes). Stand to Sit: 1: +2 Total assist;Patient percentage (comment);To chair/3-in-1;With upper extremity assist;With armrests Stand to Sit Details: x 1 with +2 assist pt=30%; x3 with Sara-Plus    Exercise  Other Exercises Other Exercises: worked on positioning LLE in  neutral (tends to internally rotate with foot falling into supination); used Prevalon boot End of Session PT - End of Session Equipment Utilized During Treatment: Gait belt Activity Tolerance: Patient limited by fatigue Patient left: in chair;with call bell in reach Nurse Communication: Mobility status for transfers;Need for lift equipment (nursing used maxisky to get pt OOB, discussed use of Huntley Dec-) General Behavior During Session: [facility] for tasks performed Cognition: [facility] for tasks performed  Json Koelzer 06/07/2011, 5:22 PM Pager [phone]

## 2011-06-07 NOTE — Progress Notes (Signed)
Patient ID: John [name], male   DOB: Nov 27, 1940, 71 y.o.   MRN: [id number] Patient seems more alert than last visit. Denies any significant complaints of back pain. Lower extremities are moving well. Patient will require extensive rehabilitation.

## 2011-06-07 NOTE — PMR Pre-admission (Signed)
PMR Admission Coordinator Pre-Admission Assessment  Patient: John [name] is an 71 y.o., male MRN: [id number] DOB: Nov 22, 1940 Height: 6\' 2"  (188 cm) Weight: 97.2 kg (214 lb 4.6 oz)  Insurance Information HMO:     PPO:      PCP:      IPA:      80/20: yes     OTHER: no HMO PRIMARY: Medicare a and b      Policy#: [id number]      Subscriber: pt CM Name:       Phone#:      Fax#:  Pre-Cert#:       Employer:  Benefits:  Phone #: vision share     Name: 06/05/11 Eff. Date: a and b 06/20/04     Deduct: $1184      Out of Pocket Max: none      Life Max: none CIR: 100%      SNF: 20 full days LBD 01/15/10 Outpatient: 80%     Co-Pay: 20% Home Health: 100%      Co-Pay: none DME: 80%     Co-Pay: 20% Providers: pt choice  SECONDARY: [employer] [place]      Policy#: [id number]      Subscriber: pt No authorization needed with mcr primary   Emergency Contact Information Contact Information    Name Relation Home Work Mobile   [place] P Spouse [phone]  [phone]     Current Medical History  Patient Admitting Diagnosis: Lumbar HNP and radiculopathy s/p laminotomies and facetotomies, with pstop respiratory failure and anoxia  History of Present Illness: 71 year old right-handed male with history of coronary artery disease as well as atrial fibrillation, and diabetes mellitus with insulin pump, renal transplant 2002 creatinine baseline 1.3 and COPD admitted March 26 with radiating low back pain. X-rays and imaging revealed the spondylosis and herniated nuclear pulposus L5-S1 with radiculopathy. Underwent bilateral laminotomies and foraminotomies decompression L5 and S1 March 26 per Dr. Danielle Dess. Postoperative confusion and respiratory distress secondary to congestive heart failure and initially placed on Lasix therapy. On March 31 developed worsening respiratory distress and hypoxia with critical care medicine consulted and was intubated. Patient spiked a fever to 100.7 placed on empiric antibiotics.  Blood cultures 3/28 negative. Patient was later extubated and monitored closely. Patient did undergo right thoracentesis April 11 for pleural effusion with 400 cc removed by interventional radiology. Patient bouts of loose stools with a flexiseal placed.  Confusion has improved but still ongoing suspect hypoxic encephalopathy. Patient with profound deconditioning.  Hx Bipolar on multiple neurogenic medications, depression. Wife states patient had previous issues with orthostatic hypotension and patient placed on mestinon which helped. Parkinsons'  meds resumed. Wife reports and issue with impaction this hospitalization and the need for a bowel regimen on CIR.  Past Medical History  Past Medical History  Diagnosis Date  . CAD (coronary artery disease)     a. myoview 1/11: lg inf fixed defect;   b. cath 1/11: 3v CAD,  c. s/p CABG 1/11 c/b acute renal failure and AFib  . Atrial fibrillation   . Chronic diastolic heart failure     a. echo 4/11: EF 45-50%, mod LVH, LAE, inf and septal HK  . First degree AV block   . Orthostatic hypotension     pyridostigmine  . History of renal transplant   . DM2 (diabetes mellitus, type 2)   . Diabetic gastroparesis   . HTN (hypertension)   . HLD (hyperlipidemia)   . PAD (peripheral artery disease)  s/p R SFA-Pop atherectomy 11/11  . Prostate cancer   . ED (erectile dysfunction)   . DDD (degenerative disc disease), lumbar   . Tremor   . Obesity   . Low testosterone   . Chronic kidney disease     s/p renal transplant 2002  . Pleural effusion, right     loculated  . COPD (chronic obstructive pulmonary disease)   . Anxiety   . Diabetes mellitus     insulin pump    Family History  family history includes Coronary artery disease in his mother and sister; Heart attack in his sister; Hepatitis in an unspecified family member; Tuberculosis in his father; and Valvular heart disease in his father.  Prior Rehab/Hospitalizations: CIR 2011  Current  Medications  Current facility-administered medications:0.45 % sodium chloride infusion, , Intravenous, Continuous, Oretha Milch, MD, Last Rate: 20 mL/hr at 06/04/11 1435;  0.45 % sodium chloride infusion, , Intravenous, Continuous, Alyson Reedy, MD, Last Rate: 10 mL/hr at 06/03/11 2000, 10 mL at 06/03/11 2000;  acetaminophen (TYLENOL) solution 650 mg, 650 mg, Per Tube, Q4H PRN, Mariea Stable, MD, 650 mg at 06/01/11 0114 albuterol (PROVENTIL) (5 MG/ML) 0.5% nebulizer solution 2.5 mg, 2.5 mg, Nebulization, Q6H, Merwyn Katos, MD, 2.5 mg at 06/08/11 0806;  albuterol (PROVENTIL) (5 MG/ML) 0.5% nebulizer solution 2.5 mg, 2.5 mg, Nebulization, Q3H PRN, Merwyn Katos, MD;  antiseptic oral rinse (BIOTENE) solution 15 mL, 1 application, Mouth Rinse, BID, Storm Frisk, MD, 15 mL at 06/07/11 2052 aspirin chewable tablet 81 mg, 81 mg, Per Tube, Daily, Mariea Stable, MD, 81 mg at 06/07/11 1610;  calcium carbonate (OS-CAL - dosed in mg of elemental calcium) tablet 1,000 mg of elemental calcium, 2 tablet, Per Tube, Daily, Storm Frisk, MD, 1,000 mg of elemental calcium at 06/07/11 0905;  carbidopa-levodopa (SINEMET) 25-100 MG per tablet 1 tablet, 1 tablet, Per Tube, TID, Storm Frisk, MD, 1 tablet at 06/07/11 2101 fentaNYL (DURAGESIC - dosed mcg/hr) 50 mcg, 50 mcg, Transdermal, Q72H, Merwyn Katos, MD, 50 mcg at 06/07/11 9604;  fentaNYL (SUBLIMAZE) injection 25-50 mcg, 25-50 mcg, Intravenous, Q2H PRN, Cristi Loron, MD, 50 mcg at 06/07/11 1218;  food thickener (RESOURCE THICKENUP CLEAR) powder 120 g, 1 Can, Oral, PRN, Oretha Milch, MD;  hydrALAZINE (APRESOLINE) injection 10 mg, 10 mg, Intravenous, Q6H PRN, Darnelle Maffucci, MD, 10 mg at 06/01/11 0114 hydrocortisone cream 1 %, , Topical, TID PRN, Darnelle Maffucci, MD;  insulin aspart (novoLOG) injection 0-20 Units, 0-20 Units, Subcutaneous, TID WC, Leslye Peer, MD, 4 Units at 06/07/11 1627;  insulin glargine (LANTUS) injection 15 Units, 15 Units,  Subcutaneous, Daily, Oretha Milch, MD, 15 Units at 06/07/11 0904;  ipratropium (ATROVENT) nebulizer solution 0.5 mg, 0.5 mg, Nebulization, Q6H, Merwyn Katos, MD, 0.5 mg at 06/08/11 5409 lamoTRIgine (LAMICTAL) tablet 200 mg, 200 mg, Oral, Daily, Barnett Abu, MD, 200 mg at 06/07/11 8119;  mycophenolate (CELLCEPT) 200 MG/ML suspension 500 mg, 500 mg, Oral, BID, Storm Frisk, MD, 500 mg at 06/07/11 2101;  ondansetron [facility]) injection 4 mg, 4 mg, Intravenous, Q4H PRN, Barnett Abu, MD, 4 mg at 06/07/11 0902;  pantoprazole (PROTONIX) EC tablet 40 mg, 40 mg, Oral, BID AC, Storm Frisk, MD, 40 mg at 06/07/11 1627 predniSONE 5 MG/5ML solution 5 mg, 5 mg, Oral, Q breakfast, Zada Girt, MD, 5 mg at 06/07/11 0800;  pyridostigmine (MESTINON) tablet 30 mg, 30 mg, Oral, TID, Laurey Morale, MD, 30 mg at 06/07/11  2101;  QUEtiapine (SEROQUEL) tablet 100 mg, 100 mg, Oral, QHS, Merwyn Katos, MD, 100 mg at 06/07/11 2100;  sodium chloride 0.9 % injection 10-40 mL, 10-40 mL, Intracatheter, Q12H, Storm Frisk, MD, 20 mL at 06/07/11 2101 sodium chloride 0.9 % injection 10-40 mL, 10-40 mL, Intracatheter, PRN, Storm Frisk, MD;  tacrolimus (PROGRAF) capsule 5 mg, 5 mg, Oral, BID, Darnelle Maffucci, MD, 5 mg at 06/07/11 2101;  Tamsulosin HCl (FLOMAX) capsule 0.4 mg, 0.4 mg, Oral, Daily, Oretha Milch, MD, 0.4 mg at 06/07/11 4540;  Vilazodone HCl TABS 20 mg, 20 mg, Oral, Daily, Storm Frisk, MD, 20 mg at 06/07/11 9811  Patients Current Diet: Dysphagia Dysphagia 3 diet with thin liquids  Precautions / Restrictions Precautions Precautions: Back;Fall Precaution Booklet Issued: No Precaution Comments: requires max cues to maintain back precautions. Required Braces or Orthoses DO NOT USE: No Restrictions Weight Bearing Restrictions: No Other Position/Activity Restrictions: no bend arch twist per back precautions   Prior Activity Level   Home Assistive Devices / Equipment Home Assistive  Devices/Equipment: Oxygen Home Adaptive Equipment: Hand-held shower hose;Shower chair with back;Straight cane;Walker - rolling;Bedside commode/3-in-1 Patient uses oxygen 2 liters at HS, 3 to 4 liters during the day depending on his activity level  Prior Functional Level Prior Function Level of Independence: Independent Bath:  (occasional assist to get in/out of shower) Dressing:  (occasional assist with shoes.) Able to Take Stairs?: Yes Driving: Yes Vocation: Retired Comments: fixes cars  Current Functional Level Cognition  Arousal/Alertness: Awake/alert Overall Cognitive Status: Impaired Orientation Level: Oriented to person;Oriented to place    Sensation  Light Touch: Impaired Detail Light Touch Impaired Details: Impaired RLE;Impaired LLE Additional Comments: Pt reports that his RLE is numb, numbness appears to improve with movement.  With light touch patient able to discriminate when RLE is being touched but not ableto tell when left foot is being touched.    Coordination  Gross Motor Movements are Fluid and Coordinated: Yes (bil. UE) Fine Motor Movements are Fluid and Coordinated: Yes (bil. UE)    ADLs  Grooming: Simulated;Set up;Supervision/safety Grooming Details (indicate cue type and reason): clean and don glasses Where Assessed - Grooming: Sitting, chair Upper Body Bathing: Simulated;Maximal assistance Where Assessed - Upper Body Bathing: Sitting, bed;Unsupported Lower Body Bathing: Simulated;+2 Total assistance Lower Body Bathing Details (indicate cue type and reason): Pt unable to cross ankles over knees to reach feet as he had done before surgery. Where Assessed - Lower Body Bathing: Sitting, bed;Lean right and/or left Upper Body Dressing: Simulated Upper Body Dressing Details (indicate cue type and reason): Donned gown with setup assist to snap sleeve around lines Where Assessed - Upper Body Dressing: Sitting, bed;Unsupported Lower Body Dressing: Performed;+1  Total assistance Lower Body Dressing Details (indicate cue type and reason): don socks. pt attempted to cross ankles over knees- able to get feet about halfway up shins Where Assessed - Lower Body Dressing: Sitting, chair Toilet Transfer: Not assessed Toilet Transfer Details (indicate cue type and reason): +2 assist for safety,  Pt requiring increased time.  Toilet Transfer Method: Not assessed Acupuncturist: Other (comment) (chair) Toileting - Clothing Manipulation: Not assessed Toileting - Hygiene: Performed;+1 Total assistance Toileting - Hygiene Details (indicate cue type and reason): pt with BMx2 sit to stand with Huntley Dec Plus lift.  Where Assessed - Toileting Hygiene: Standing Equipment Used: Rolling walker Ambulation Related to ADLs: Pt reports that Rt foot feels numb during ambulation. +2 assist for safety and for lines and leads.  Pt fatigued quickly due to SOB. ADL Comments: attempted sit to stand from chair with therapist on either side- pt able to clear bottom off of chair a good distance, but unable to achieve upright standing. Perfromed sit to stand with Huntley Dec Plus lift x 3. Pt did well, but did c/o of low back back each time upon sitting back down. Pt stated this cleared quickly.     Mobility  Bed Mobility: No Rolling Right: 1: +2 Total assist;With rail;Patient percentage (comment) Rolling Right Details (indicate cue type and reason): pt able to flex knees without assist, required assist to maintain knees flexed and to initiate rolling to Rt, assist with bed pad, pt= 25% Rolling Left: 1: +2 Total assist;Patient percentage (comment);With rail Rolling Left Details (indicate cue type and reason): as with right Right Sidelying to Sit: 1: +2 Total assist;Patient percentage (comment);HOB elevated (comment degrees);With rails Right Sidelying to Sit Details (indicate cue type and reason): each leg elevated to reduce friction and pt then able to extend knee/kick leg over EOB,  assist to raise torso due to weakness, pt = 50%, HOB 30 Left Sidelying to Sit: 1: +2 Total assist;Patient percentage (comment);HOB elevated (comment degrees) (30%; bed pad to assist hips to EOB) Left Sidelying to Sit Details (indicate cue type and reason): head of bed in 70% elevation; requires max assist to clear feet from foot of bed to side, and cannot actively scoot hips or support trunk to EOB; Sitting - Scoot to Edge of Bed: 1: +2 Total assist Sitting - Scoot to Edge of Bed Details (indicate cue type and reason): use of bed pad, +1 to stabilize trunk and 2nd person to scoot hips forward Sit to Supine: 1: +2 Total assist;Patient percentage (comment) (pt 10%) Sit to Supine - Details (indicate cue type and reason): max cueing for back precautions, safe technique.   Sit to Sidelying Right: 1: +2 Total assist;Patient percentage (comment);HOB flat;With rail Sit to Sidelying Right Details (indicate cue type and reason): pt required max cues and assist to maintain back precautions (esp no twisting) as he tries to reach behind himself and transition sit to supine (vs sidelying)    Transfers  Transfers: Yes Sit to Stand: 1: +2 Total assist;With upper extremity assist;With armrests;From chair/3-in-1;Patient percentage (comment) Sit to Stand Details (indicate cue type and reason): with pad under pelvis, pt=50% able to clear buttocks off chair to ~16" and pt able to transition Lt hand onto PT's forearm, however could not extend hips to achieve upright posture; then used Sara-Plus to stand x3 (stood 4 minutes, 3 minutes, 3 minutes). Stand to Sit: 1: +2 Total assist;Patient percentage (comment);To chair/3-in-1;With upper extremity assist;With armrests Stand to Sit Details: x 1 with +2 assist pt=30%; x3 with Sara-Plus Lateral/Scoot Transfers: 1: +2 Total assist;Patient percentage (comment);From elevated surface Lateral/Scoot Transfer Details (indicate cue type and reason): scooting to pt's rt towards HOB  while sitting EOB; Pt able to bear weight through RLE minimally to assist with transfer; scooted x 5    Ambulation / Gait / Stairs / Wheelchair Mobility  Ambulation/Gait Ambulation/Gait: No Ambulation/Gait Assistance: 1: +2 Total assist;Patient percentage (comment) (pt = 60%) Ambulation/Gait Assistance Details (indicate cue type and reason): Pt required two person assist for safety and VC and manual cues for placement of RW. Ambulation Distance (Feet): 30 Feet Assistive device: Rolling walker Gait Pattern: Step-to pattern;Decreased stride length Stairs: No Wheelchair Mobility Wheelchair Mobility: No    Posture / Balance Posture/Postural Control Posture/Postural Control: Postural limitations Postural Limitations: Pt  able to sit upright at EOB with minimal assistance (guarding) with moderate cues and bil UE support Static Sitting Balance Static Sitting - Balance Support: Bilateral upper extremity supported;Feet supported Static Sitting - Level of Assistance: 3: Mod assist Static Sitting - Comment/# of Minutes: EOB 15 minutes total with varying assist minguard assist up to mod assist with increasing fatigue; pt holding head erect with cues     Previous Home Environment Living Arrangements: Spouse/significant other Lives With: Spouse Receives Help From: Family Type of Home: House Home Layout: Multi-level;Able to live on main level with bedroom/bathroom Alternate Level Stairs-Number of Steps: 12 Home Access: Stairs to enter Entrance Stairs-Rails: Right Entrance Stairs-Number of Steps: 3 Bathroom Shower/Tub: Associate Professor: Yes Home Care Services: No  Discharge Living Setting Plans for Discharge Living Setting: Patient's home;Lives with (comment) (spouse) Type of Home at Discharge: House Discharge Home Layout: Multi-level;Able to live on main level with bedroom/bathroom;Full bath on main level Alternate Level Stairs-Rails:  Right Alternate Level Stairs-Number of Steps: 12 steps Discharge Home Access: Stairs to enter Entrance Stairs-Rails: Right Entrance Stairs-Number of Steps: 3 steps Discharge Bathroom Shower/Tub: Tub/shower unit Discharge Bathroom Toilet: Standard Discharge Bathroom Accessibility: Yes How Accessible: Accessible via walker Do you have any problems obtaining your medications?: No  Social/Family/Support Systems Patient Roles: Spouse;Parent;Other (Comment) (works on cars) Solicitor Information: Stanely Sexson, wife Anticipated Caregiver: wife Anticipated Caregiver's Contact Information: home [phone], cell [phone] Ability/Limitations of Caregiver: min assist Caregiver Availability: 24/7 Discharge Plan Discussed with Primary Caregiver: Yes Is Caregiver In Agreement with Plan?: Yes Does Caregiver/Family have Issues with Lodging/Transportation while Pt is in Rehab?: No  Goals/Additional Needs Patient/Family Goal for Rehab: min assist PT, min to mod OT, supervision to min SLP Expected length of stay: ELOS 3 weeks Special Service Needs: wife states that pt was in CIR 2011. Had orthostatic hypotension issues. Was on [place] which helped.  (Wife wants MD to review meds pta to assure meds are maximize) Pt/Family Agrees to Admission and willing to participate: Yes Program Orientation Provided & Reviewed with Pt/Caregiver Including Roles  & Responsibilities: Yes  Patient Condition: Please see physician update to information in consult dated 06/05/11.  Preadmission Screen Completed By:  Clois Dupes, 06/08/2011 8:19 AM ______________________________________________________________________   Discussed status with Dr. [name] on 4/18/13at 0818  and received telephone approval for admission today.  Admission Coordinator:  Clois Dupes, time 8657 Date 06/08/11.

## 2011-06-07 NOTE — Progress Notes (Signed)
Occupational Therapy Treatment Patient Details Name: [name] MRN: [id number] DOB: 1941-01-11 Today's Date: 06/07/2011  OT Assessment/Plan OT Assessment/Plan OT Plan: Discharge plan remains appropriate OT Frequency: Min 2X/week Follow Up Recommendations: Inpatient Rehab Equipment Recommended: Defer to next venue OT Goals ADL Goals ADL Goal: Grooming - Progress: Progressing toward goals ADL Goal: Lower Body Bathing - Progress: Progressing toward goals ADL Goal: Lower Body Dressing - Progress: Progressing toward goals ADL Goal: Toilet Transfer - Progress: Progressing toward goals ADL Goal: Toileting - Hygiene - Progress: Progressing toward goals  OT Treatment Precautions/Restrictions  Precautions Precautions: Back;Fall Restrictions Weight Bearing Restrictions: No   ADL ADL Grooming: Simulated;Set up;Supervision/safety Grooming Details (indicate cue type and reason): clean and don glasses Where Assessed - Grooming: Sitting, chair Lower Body Dressing: Performed;+1 Total assistance Lower Body Dressing Details (indicate cue type and reason): don socks. pt attempted to cross ankles over knees- able to get feet about halfway up shins Where Assessed - Lower Body Dressing: Sitting, chair Toileting - Hygiene: Performed;+1 Total assistance Toileting - Hygiene Details (indicate cue type and reason): pt with BMx2 sit to stand with Huntley Dec Plus lift.  Where Assessed - Toileting Hygiene: Standing ADL Comments: attempted sit to stand from chair with therapist on either side- pt able to clear bottom off of chair a good distance, but unable to achieve upright standing. Perfromed sit to stand with Huntley Dec Plus lift x 3. Pt did well, but did c/o of low back back each time upon sitting back down. Pt stated this cleared quickly.  Mobility  Bed Mobility Bed Mobility: No  End of Session OT - End of Session Equipment Utilized During Treatment: Gait belt Activity Tolerance: Patient limited by  pain Patient left: in chair;with call bell in reach Nurse Communication: Mobility status for transfers;Need for lift equipment General Behavior During Session: [facility] for tasks performed Cognition: [facility] for tasks performed  John [name]  06/07/2011, 3:07 PM

## 2011-06-07 NOTE — Progress Notes (Signed)
Nutrition Follow-up  Pt states his appetite is OK. EN discontinued. S/p FEES 4/16. PO intake very poor at 0-15% per flowsheet records. Pt declining addition of snacks or supplements at this time.  Diet Order:  Dysphagia 3, thin liquids  Meds: Scheduled Meds:   . albuterol  2.5 mg Nebulization Q6H  . antiseptic oral rinse  1 application Mouth Rinse BID  . aspirin  81 mg Per Tube Daily  . calcium carbonate  2 tablet Per Tube Daily  . carbidopa-levodopa  1 tablet Per Tube TID  . fentaNYL  50 mcg Transdermal Q72H  . insulin aspart  0-20 Units Subcutaneous TID WC  . insulin glargine  15 Units Subcutaneous Daily  . ipratropium  0.5 mg Nebulization Q6H  . lamoTRIgine  200 mg Oral Daily  . mycophenolate  500 mg Oral BID  . pantoprazole  40 mg Oral BID AC  . predniSONE  5 mg Oral Q breakfast  . pyridostigmine  30 mg Oral TID  . QUEtiapine  100 mg Oral QHS  . sodium chloride  10-40 mL Intracatheter Q12H  . tacrolimus  5 mg Oral BID  . Tamsulosin HCl  0.4 mg Oral Daily  . Vilazodone HCl  20 mg Oral Daily  . DISCONTD: antiseptic oral rinse  1 application Mouth Rinse QID  . DISCONTD: insulin aspart  0-15 Units Subcutaneous Q4H  . DISCONTD: pantoprazole sodium  40 mg Per Tube BID  . DISCONTD: Vilazodone HCl  20 mg Oral Daily  . DISCONTD: Vilazodone HCl  20 mg Oral Daily   Continuous Infusions:   . sodium chloride 20 mL/hr at 06/04/11 1435  . sodium chloride 10 mL (06/03/11 2000)  . DISCONTD: feeding supplement (JEVITY 1.2 CAL) 20 mL/hr at 06/06/11 0450   PRN Meds:.acetaminophen (TYLENOL) oral liquid 160 mg/5 mL, albuterol, fentaNYL, food thickener, hydrALAZINE, hydrocortisone cream, ondansetron (ZOFRAN) IV, sodium chloride  Labs:  CMP     Component Value Date/Time   NA 143 06/07/2011 0315   K 4.0 06/07/2011 0315   CL 109 06/07/2011 0315   CO2 27 06/07/2011 0315   GLUCOSE 179* 06/07/2011 0315   BUN 16 06/07/2011 0315   CREATININE 0.91 06/07/2011 0315   CALCIUM 9.1 06/07/2011 0315   PROT  5.6* 06/01/2011 0330   ALBUMIN 2.0* 06/07/2011 0315   AST 25 06/01/2011 0330   ALT 10 06/01/2011 0330   ALKPHOS 92 06/01/2011 0330   BILITOT 0.6 06/01/2011 0330   GFRNONAA 83* 06/07/2011 0315   GFRAA >90 06/07/2011 0315     Intake/Output Summary (Last 24 hours) at 06/07/11 1118 Last data filed at 06/07/11 1100  Gross per 24 hour  Intake    780 ml  Output    840 ml  Net    -60 ml    CBG (last 3)   Basename 06/07/11 0742 06/07/11 0021 06/06/11 1938  GLUCAP 190* 239* 212*    Weight Status:  97.2 kg (4/16) -- stable  Re-estimated needs:  2200-2400 kcals, 120-130 gm protein  Nutrition Dx:  Inadequate Oral Intake now r/t dysphagia as evidenced by PO intake 0-15%.  Status: Ongoing.  Goal:  Oral intake to meet >90% of estimated nutrition needs, unmet Monitor: PO intake, desire for supplements, weight, labs, I/O's  Intervention:    No nutrition intervention at this time -- pt declined  If PO intake does not improve, may need re-initiation of short term EN support  RD to follow for nutrition care plan  Alger Memos Pager #:  [phone]  

## 2011-06-08 ENCOUNTER — Inpatient Hospital Stay (HOSPITAL_COMMUNITY)
Admission: RE | Admit: 2011-06-08 | Discharge: 2011-06-28 | DRG: 945 | Disposition: A | Discharge status: Skilled Nursing Facility | Payer: Medicare Other | Source: Ambulatory Visit | Attending: Physical Medicine & Rehabilitation | Admitting: Physical Medicine & Rehabilitation

## 2011-06-08 DIAGNOSIS — I2510 Atherosclerotic heart disease of native coronary artery without angina pectoris: Secondary | ICD-10-CM | POA: Diagnosis present

## 2011-06-08 DIAGNOSIS — Z794 Long term (current) use of insulin: Secondary | ICD-10-CM

## 2011-06-08 DIAGNOSIS — J4489 Other specified chronic obstructive pulmonary disease: Secondary | ICD-10-CM | POA: Diagnosis present

## 2011-06-08 DIAGNOSIS — G931 Anoxic brain damage, not elsewhere classified: Secondary | ICD-10-CM | POA: Diagnosis present

## 2011-06-08 DIAGNOSIS — D649 Anemia, unspecified: Secondary | ICD-10-CM | POA: Diagnosis present

## 2011-06-08 DIAGNOSIS — R339 Retention of urine, unspecified: Secondary | ICD-10-CM | POA: Diagnosis not present

## 2011-06-08 DIAGNOSIS — M5126 Other intervertebral disc displacement, lumbar region: Secondary | ICD-10-CM | POA: Diagnosis present

## 2011-06-08 DIAGNOSIS — Z8546 Personal history of malignant neoplasm of prostate: Secondary | ICD-10-CM

## 2011-06-08 DIAGNOSIS — R197 Diarrhea, unspecified: Secondary | ICD-10-CM | POA: Diagnosis not present

## 2011-06-08 DIAGNOSIS — Z87891 Personal history of nicotine dependence: Secondary | ICD-10-CM

## 2011-06-08 DIAGNOSIS — G20 Parkinson's disease: Secondary | ICD-10-CM | POA: Diagnosis present

## 2011-06-08 DIAGNOSIS — I5032 Chronic diastolic (congestive) heart failure: Secondary | ICD-10-CM | POA: Diagnosis present

## 2011-06-08 DIAGNOSIS — Z7982 Long term (current) use of aspirin: Secondary | ICD-10-CM

## 2011-06-08 DIAGNOSIS — Z951 Presence of aortocoronary bypass graft: Secondary | ICD-10-CM

## 2011-06-08 DIAGNOSIS — R5381 Other malaise: Secondary | ICD-10-CM

## 2011-06-08 DIAGNOSIS — Z7902 Long term (current) use of antithrombotics/antiplatelets: Secondary | ICD-10-CM

## 2011-06-08 DIAGNOSIS — F341 Dysthymic disorder: Secondary | ICD-10-CM | POA: Diagnosis present

## 2011-06-08 DIAGNOSIS — E119 Type 2 diabetes mellitus without complications: Secondary | ICD-10-CM | POA: Diagnosis present

## 2011-06-08 DIAGNOSIS — Z5189 Encounter for other specified aftercare: Secondary | ICD-10-CM

## 2011-06-08 DIAGNOSIS — I4891 Unspecified atrial fibrillation: Secondary | ICD-10-CM | POA: Diagnosis present

## 2011-06-08 DIAGNOSIS — IMO0002 Reserved for concepts with insufficient information to code with codable children: Secondary | ICD-10-CM

## 2011-06-08 DIAGNOSIS — G20A1 Parkinson's disease without dyskinesia, without mention of fluctuations: Secondary | ICD-10-CM | POA: Diagnosis present

## 2011-06-08 DIAGNOSIS — R1310 Dysphagia, unspecified: Secondary | ICD-10-CM | POA: Diagnosis present

## 2011-06-08 DIAGNOSIS — Z940 Kidney transplant status: Secondary | ICD-10-CM

## 2011-06-08 DIAGNOSIS — J449 Chronic obstructive pulmonary disease, unspecified: Secondary | ICD-10-CM | POA: Diagnosis present

## 2011-06-08 DIAGNOSIS — I509 Heart failure, unspecified: Secondary | ICD-10-CM | POA: Diagnosis present

## 2011-06-08 LAB — TACROLIMUS LEVEL: Tacrolimus (FK506) - LabCorp: 8 ng/mL

## 2011-06-08 LAB — GLUCOSE, CAPILLARY: Glucose-Capillary: 116 mg/dL — ABNORMAL HIGH (ref 70–99)

## 2011-06-08 MED ORDER — SORBITOL 70 % SOLN: 30.0000 mL | Freq: Every day | Status: DC | PRN

## 2011-06-08 MED ORDER — LAMOTRIGINE 200 MG PO TABS
200.0000 mg | ORAL_TABLET | Freq: Every day | ORAL | Status: DC
  Administered 2011-06-09 – 2011-06-28 (×20): 200 mg via ORAL
  Filled 2011-06-08 (×22): qty 1

## 2011-06-08 MED ORDER — INSULIN GLARGINE 100 UNIT/ML ~~LOC~~ SOLN
15.0000 [IU] | Freq: Every day | SUBCUTANEOUS | Status: DC
  Administered 2011-06-09 – 2011-06-12 (×4): 15 [IU] via SUBCUTANEOUS

## 2011-06-08 MED ORDER — ALBUTEROL SULFATE (5 MG/ML) 0.5% IN NEBU
2.5000 mg | INHALATION_SOLUTION | RESPIRATORY_TRACT | Status: DC | PRN
  Administered 2011-06-20: 2.5 mg via RESPIRATORY_TRACT
  Filled 2011-06-08: qty 0.5

## 2011-06-08 MED ORDER — ONDANSETRON HCL 4 MG PO TABS: 4.0000 mg | ORAL_TABLET | Freq: Four times a day (QID) | ORAL | Status: DC | PRN

## 2011-06-08 MED ORDER — INSULIN ASPART 100 UNIT/ML ~~LOC~~ SOLN
0.0000 [IU] | Freq: Three times a day (TID) | SUBCUTANEOUS | Status: DC
  Administered 2011-06-08: 4 [IU] via SUBCUTANEOUS
  Administered 2011-06-09: 7 [IU] via SUBCUTANEOUS
  Administered 2011-06-09: 4 [IU] via SUBCUTANEOUS
  Administered 2011-06-09 – 2011-06-10 (×2): 7 [IU] via SUBCUTANEOUS
  Administered 2011-06-10: 4 [IU] via SUBCUTANEOUS
  Administered 2011-06-11 (×2): 7 [IU] via SUBCUTANEOUS
  Administered 2011-06-11: 11 [IU] via SUBCUTANEOUS
  Administered 2011-06-12: 7 [IU] via SUBCUTANEOUS
  Administered 2011-06-12: 11 [IU] via SUBCUTANEOUS
  Administered 2011-06-12 – 2011-06-13 (×3): 7 [IU] via SUBCUTANEOUS
  Administered 2011-06-13 – 2011-06-14 (×2): 15 [IU] via SUBCUTANEOUS
  Administered 2011-06-14: 4 [IU] via SUBCUTANEOUS
  Administered 2011-06-14 – 2011-06-15 (×3): 11 [IU] via SUBCUTANEOUS
  Administered 2011-06-15: 4 [IU] via SUBCUTANEOUS
  Administered 2011-06-16: 11 [IU] via SUBCUTANEOUS
  Administered 2011-06-16: 7 [IU] via SUBCUTANEOUS
  Administered 2011-06-16: 3 [IU] via SUBCUTANEOUS
  Administered 2011-06-17: 15 [IU] via SUBCUTANEOUS
  Administered 2011-06-17 (×2): 4 [IU] via SUBCUTANEOUS
  Administered 2011-06-18: 11 [IU] via SUBCUTANEOUS
  Administered 2011-06-18 (×2): 4 [IU] via SUBCUTANEOUS
  Administered 2011-06-19: 11 [IU] via SUBCUTANEOUS
  Administered 2011-06-19 – 2011-06-20 (×2): 4 [IU] via SUBCUTANEOUS
  Administered 2011-06-20: 3 [IU] via SUBCUTANEOUS
  Administered 2011-06-21 – 2011-06-22 (×4): 4 [IU] via SUBCUTANEOUS
  Administered 2011-06-22 – 2011-06-25 (×6): 3 [IU] via SUBCUTANEOUS
  Administered 2011-06-25 – 2011-06-26 (×2): 4 [IU] via SUBCUTANEOUS
  Administered 2011-06-26 – 2011-06-27 (×2): 3 [IU] via SUBCUTANEOUS
  Administered 2011-06-27: 4 [IU] via SUBCUTANEOUS
  Administered 2011-06-28: 3 [IU] via SUBCUTANEOUS
  Administered 2011-06-28: 4 [IU] via SUBCUTANEOUS

## 2011-06-08 MED ORDER — ISOSORBIDE MONONITRATE ER 60 MG PO TB24
60.0000 mg | ORAL_TABLET | Freq: Every day | ORAL | Status: DC
  Administered 2011-06-09 – 2011-06-28 (×20): 60 mg via ORAL
  Filled 2011-06-08 (×21): qty 1

## 2011-06-08 MED ORDER — PREDNISONE 5 MG/5ML PO SOLN
5.0000 mg | Freq: Every day | ORAL | Status: DC
  Administered 2011-06-09 – 2011-06-11 (×3): 5 mg via ORAL
  Filled 2011-06-08 (×7): qty 5

## 2011-06-08 MED ORDER — FUROSEMIDE 80 MG PO TABS
80.0000 mg | ORAL_TABLET | Freq: Every day | ORAL | Status: DC
  Administered 2011-06-09 – 2011-06-28 (×20): 80 mg via ORAL
  Filled 2011-06-08 (×21): qty 1

## 2011-06-08 MED ORDER — FENTANYL 50 MCG/HR TD PT72
50.0000 ug | MEDICATED_PATCH | TRANSDERMAL | Status: DC
  Administered 2011-06-08 – 2011-06-11 (×2): 50 ug via TRANSDERMAL
  Filled 2011-06-08 (×2): qty 1

## 2011-06-08 MED ORDER — CALCIUM CARBONATE 1250 (500 CA) MG PO TABS
2.0000 | ORAL_TABLET | Freq: Every day | ORAL | Status: DC
  Administered 2011-06-09 – 2011-06-28 (×20): 1000 mg
  Filled 2011-06-08 (×21): qty 2

## 2011-06-08 MED ORDER — ALBUTEROL SULFATE (5 MG/ML) 0.5% IN NEBU
2.5000 mg | INHALATION_SOLUTION | Freq: Four times a day (QID) | RESPIRATORY_TRACT | Status: DC
  Administered 2011-06-08 – 2011-06-11 (×13): 2.5 mg via RESPIRATORY_TRACT
  Filled 2011-06-08 (×13): qty 0.5

## 2011-06-08 MED ORDER — ONDANSETRON HCL 4 MG/2ML IJ SOLN: 4.0000 mg | Freq: Four times a day (QID) | INTRAMUSCULAR | Status: DC | PRN

## 2011-06-08 MED ORDER — TAMSULOSIN HCL 0.4 MG PO CAPS
0.4000 mg | ORAL_CAPSULE | Freq: Every day | ORAL | Status: DC
  Administered 2011-06-09 – 2011-06-21 (×13): 0.4 mg via ORAL
  Filled 2011-06-08 (×15): qty 1

## 2011-06-08 MED ORDER — IPRATROPIUM BROMIDE 0.02 % IN SOLN
0.5000 mg | Freq: Four times a day (QID) | RESPIRATORY_TRACT | Status: DC
  Administered 2011-06-08 – 2011-06-11 (×13): 0.5 mg via RESPIRATORY_TRACT
  Filled 2011-06-08 (×12): qty 2.5

## 2011-06-08 MED ORDER — EZETIMIBE 10 MG PO TABS
10.0000 mg | ORAL_TABLET | Freq: Every day | ORAL | Status: DC
  Administered 2011-06-09 – 2011-06-28 (×20): 10 mg via ORAL
  Filled 2011-06-08 (×21): qty 1

## 2011-06-08 MED ORDER — BIOTENE DRY MOUTH MT LIQD
1.0000 "application " | Freq: Two times a day (BID) | OROMUCOSAL | Status: DC
  Administered 2011-06-08 – 2011-06-28 (×36): 15 mL via OROMUCOSAL

## 2011-06-08 MED ORDER — ASPIRIN 81 MG PO CHEW
81.0000 mg | CHEWABLE_TABLET | Freq: Every day | ORAL | Status: DC
  Administered 2011-06-09 – 2011-06-28 (×20): 81 mg
  Filled 2011-06-08 (×18): qty 1

## 2011-06-08 MED ORDER — OXYCODONE HCL 5 MG PO TABS
5.0000 mg | ORAL_TABLET | Freq: Four times a day (QID) | ORAL | Status: DC | PRN
  Administered 2011-06-08 – 2011-06-09 (×3): 5 mg via ORAL
  Filled 2011-06-08 (×3): qty 1

## 2011-06-08 MED ORDER — MAGNESIUM OXIDE 400 MG PO TABS
400.0000 mg | ORAL_TABLET | Freq: Two times a day (BID) | ORAL | Status: DC
  Administered 2011-06-08 – 2011-06-19 (×23): 400 mg via ORAL
  Filled 2011-06-08 (×30): qty 1

## 2011-06-08 MED ORDER — RISPERIDONE 1 MG PO TABS
1.0000 mg | ORAL_TABLET | Freq: Every day | ORAL | Status: DC
  Administered 2011-06-08 – 2011-06-09 (×2): 1 mg via ORAL
  Filled 2011-06-08 (×3): qty 1

## 2011-06-08 MED ORDER — FERROUS SULFATE 325 (65 FE) MG PO TABS
325.0000 mg | ORAL_TABLET | Freq: Every day | ORAL | Status: DC
  Administered 2011-06-09 – 2011-06-28 (×20): 325 mg via ORAL
  Filled 2011-06-08 (×21): qty 1

## 2011-06-08 MED ORDER — CARBIDOPA-LEVODOPA 25-100 MG PO TABS
1.0000 | ORAL_TABLET | Freq: Three times a day (TID) | ORAL | Status: DC
  Administered 2011-06-08 – 2011-06-28 (×61): 1
  Filled 2011-06-08 (×65): qty 1

## 2011-06-08 MED ORDER — CLOPIDOGREL BISULFATE 75 MG PO TABS
75.0000 mg | ORAL_TABLET | Freq: Every day | ORAL | Status: DC
  Administered 2011-06-09 – 2011-06-28 (×20): 75 mg via ORAL
  Filled 2011-06-08 (×21): qty 1

## 2011-06-08 MED ORDER — TACROLIMUS 1 MG PO CAPS
5.0000 mg | ORAL_CAPSULE | Freq: Two times a day (BID) | ORAL | Status: DC
  Administered 2011-06-08 – 2011-06-28 (×40): 5 mg via ORAL
  Filled 2011-06-08 (×46): qty 5

## 2011-06-08 MED ORDER — PANTOPRAZOLE SODIUM 40 MG PO TBEC
40.0000 mg | DELAYED_RELEASE_TABLET | Freq: Two times a day (BID) | ORAL | Status: DC
  Administered 2011-06-08 – 2011-06-16 (×15): 40 mg via ORAL
  Filled 2011-06-08 (×18): qty 1

## 2011-06-08 MED ORDER — TAMSULOSIN HCL 0.4 MG PO CAPS: 0.4000 mg | ORAL_CAPSULE | Freq: Every day | ORAL | Status: DC

## 2011-06-08 MED ORDER — PYRIDOSTIGMINE BROMIDE 60 MG PO TABS
60.0000 mg | ORAL_TABLET | Freq: Three times a day (TID) | ORAL | Status: DC
  Administered 2011-06-08 – 2011-06-28 (×61): 60 mg via ORAL
  Filled 2011-06-08 (×63): qty 1

## 2011-06-08 MED ORDER — QUETIAPINE FUMARATE 50 MG PO TABS
50.0000 mg | ORAL_TABLET | Freq: Every day | ORAL | Status: DC
  Administered 2011-06-08 – 2011-06-12 (×5): 50 mg via ORAL
  Filled 2011-06-08 (×6): qty 1

## 2011-06-08 MED ORDER — MYCOPHENOLATE MOFETIL 200 MG/ML PO SUSR
500.0000 mg | Freq: Two times a day (BID) | ORAL | Status: DC
  Administered 2011-06-08 – 2011-06-28 (×40): 500 mg via ORAL
  Filled 2011-06-08 (×62): qty 2.5

## 2011-06-08 MED ORDER — ACETAMINOPHEN 325 MG PO TABS
325.0000 mg | ORAL_TABLET | ORAL | Status: DC | PRN
  Administered 2011-06-09 – 2011-06-27 (×19): 650 mg via ORAL
  Filled 2011-06-08 (×3): qty 2
  Filled 2011-06-08: qty 1
  Filled 2011-06-08 (×17): qty 2

## 2011-06-08 MED ORDER — POLYETHYLENE GLYCOL 3350 17 G PO PACK
17.0000 g | PACK | Freq: Every day | ORAL | Status: DC | PRN
  Filled 2011-06-08: qty 1

## 2011-06-08 MED ORDER — FOLIC ACID 1 MG PO TABS
1.0000 mg | ORAL_TABLET | Freq: Every day | ORAL | Status: DC
  Administered 2011-06-09 – 2011-06-28 (×20): 1 mg via ORAL
  Filled 2011-06-08 (×21): qty 1

## 2011-06-08 MED ORDER — VILAZODONE HCL 20 MG PO TABS
20.0000 mg | ORAL_TABLET | Freq: Every day | ORAL | Status: DC
  Administered 2011-06-09 – 2011-06-17 (×9): 20 mg via ORAL
  Administered 2011-06-18: 12:00:00 via ORAL
  Administered 2011-06-19 – 2011-06-28 (×10): 20 mg via ORAL
  Filled 2011-06-08 (×16): qty 1

## 2011-06-08 NOTE — Progress Notes (Signed)
Speech Language Pathology Dysphagia Treatment  Patient Details Name: John [name] MRN: [id number] DOB: [birth year] Today's Date: 06/08/2011  SLP Assessment/Plan/Recommendation Assessment / Recommendations / Plan Clinical Impression Statement: Education session with spouse took place, as pt was too lethargic to safely take po - while pt was sleeping resp rate was 25 at rest with diaphragmatic breathing noted.  Spouse educated to episodic aspiration risk of pt's with COPD due to respiratory/swallow discoordination and increased pulmonary ramifications with acute illness.  Spouse reports pt had an esophagram completed , note results of 09/01/10 esophagram with "mild presbyesophagus" diagnosed.  Spouse states pt with complaint of pill dysphagia  x approx one year - pt was taking fewer pills at a time with milk to ease swallow prior to admission.  Currently pt is receiving pills with applesauce.   Spouse also reported pt with excessive thirst (due to diabetes) causing him to consume large sips with intermittent coughing noted after per spouse.  Provided spouse with information regarding Provale cup for bolus flow control.  Spouse stated pt has been doing "pretty good" tolerating meals.  Further discussion of multiple risk factors for asp pna reviewed as well as importance for spouse to know Heimlich manuever.  Tremors and ataxia (neuro MD monitoring for possible Parkinson's per spouse) reported by spouse initiating approximately one year ago - this neuromotor deficit may also impair his swallow function.  Spouse instructed to plan to modify diet if necessary-especially while acutely ill and if demonstrating continued concerns for aspiration.  Rec pt be seen at next venue of care for dysphagia and ? cogling as well.  t Swallowing Goals  SLP Swallowing Goals Swallow Study Goal #1 - Progress: Progressing toward goal Swallow Study Goal #2 - Progress: Progressing toward goal  General Temperature Spikes Noted:  No Respiratory Status: Supplemental O2 delivered via (comment) Behavior/Cognition: Lethargic (pt had received pain medicine recently per spouse) Oral Cavity - Dentition: Dentures, bottom;Dentures, top Patient Positioning: Partially reclined  Oral Cavity - Oral Hygiene   Importance of oral care reviewed with spouse - especially before sleeping at night.   Dysphagia Treatment Treatment focused on: Patient/family/caregiver education Family/Caregiver Educated: Spouse Patient observed directly with PO's: No Reason PO's not observed: Clyda Hurdle, MS [facility] SLP [phone]

## 2011-06-08 NOTE — Discharge Summary (Signed)
Physician Discharge Summary  Patient ID: John [name] MRN: [id number] DOB/AGE: December 30, 1940 71 y.o.  Admit date: 05/16/2011 Discharge date: 06/08/2011  Admission Diagnoses: Herniated nucleus pulposus  Discharge Diagnoses:  Active Problems:  Acute multifactorial respiratory failure  HYPERLIPIDEMIA-MIXED  Coronary atherosclerosis of native coronary artery  CORONARY ATHEROSLERO UNSPEC TYPE BYPASS GRAFT  Atrial fibrillation  Chronic diastolic heart failure  PVD WITH CLAUDICATION  Chronic kidney disease  COPD (chronic obstructive pulmonary disease)  First degree AV block  Spondylosis  Diastolic CHF, acute on chronic  DM (diabetes mellitus), type 2  Insulin pump in place  Acute respiratory failure  Pulmonary edema  Delirium due to general medical condition  Dependence on respirator, status   Discharged Condition: fair  Hospital Course: 71 yo male with hx COPD, renal transplant (2002) ([facility]), DM on insulin pump ([place]) and multiple other medical problems as noted above presented 3/26 for lumbar laminotomies and decompression L5 and S1 r/t spondylosis and herniated nucleus pulposus. Patient underwent surgery on 3/26. Post op developed episodes of confusion and respiratory distress thought r/t CHF which initially improved with lasix. On 3/31 developed worsening respiratory distress and hypoxia and PCCM consulted due to worsened respiratory failure and hypoxia. Patient was started on vancomycin and zosyn on 3/31 and left IJ was placed. Patients respiratory culture grew rare candida and was started on microfungin by ID. Patient was intubated for worsening resp failure on 4/3 and was on respirator until 4/12. His breathing improved gradually since extubation. His hospital course was complicated by multifactorial encephalopathy & delerium, lower GI bleed/diarrhea and bilateral pleural effusion. Patient did fairly well overall. Patient eventually was deemed appropriate for a transfer to  inpatient rehab and was discharged on 06/08/11. Patient was started on all her home meds except risperdal which we plan to restart once seroquel is tapered off. Would recommend dropping seroquel from 100 to 50 mg, initiate risperdal at 1mg  & increase to 2 mg in 2 days & dc seroquel.  He will also be started back on his home dose of physostigmine on discharge.   Consults: cardiology, pulmonary/intensive care, ID, nephrology, rehabilitation medicine and Hospital medicine  Significant Diagnostic Studies:  CT CHEST 05/27/11 Small left pleural effusion. Moderate sized right pleural effusion. Bilateral lower lobe posterior atelectasis or infiltrate. Mild emphysematous changes bilateral upper lobe  Thoracentesis 4/11: 400 cc removed by IR. Borderline exudate by chemistries Resp culture 4/3 >> rare candida    Treatments:  antibiotics: vancomycin, Zosyn and caspofungin    Discharge Exam: Blood pressure 141/62, pulse 93, temperature 98.6 F (37 C), temperature source Oral, resp. rate 28, height 6\' 2"  (1.88 m), weight 214 lb 4.6 oz (97.2 kg), SpO2 100.00%. General: chronically ill appearing male,  Neuro: No focal deficits, follows commands , more lucid, mobilising with PT  CV: RRR s M.  PULM: Slightly coarse. No wheezes  GI: abd soft, +bs.  Extremities: Warm and dry, 1+ BLE edema.   Disposition: Tranfer to inpatient rehab.  Discharge Orders    Future Appointments: Provider: Department: Dept Phone: Center:   08/16/2011 1:45 PM Laurey Morale, MD [facility] [phone] [facility]     Medication List  As of 06/08/2011 10:43 AM   TAKE these medications         ADVAIR DISKUS 250-50 MCG/DOSE Aepb   Generic drug: Fluticasone-Salmeterol   Inhale 1 puff into the lungs every 12 (twelve) hours.      albuterol 108 (90 BASE) MCG/ACT inhaler   Commonly known as: PROVENTIL HFA;VENTOLIN  HFA   Inhale 2 puffs into the lungs every 6 (six) hours as needed for wheezing.      aspirin EC 81 MG  tablet   Take 1 tablet (81 mg total) by mouth daily.      atorvastatin 40 MG tablet   Commonly known as: LIPITOR   Take 40 mg by mouth daily.      calcium carbonate 1250 MG tablet   Commonly known as: OS-CAL - dosed in mg of elemental calcium   Take 2 tablets by mouth daily.      carbidopa-levodopa 25-100 MG per tablet   Commonly known as: SINEMET CR   Take 1 tablet by mouth 3 (three) times daily.      CELLCEPT 500 MG tablet   Generic drug: mycophenolate   Take 500 mg by mouth 2 (two) times daily.      clobetasol 0.05 % topical foam   Commonly known as: OLUX   Apply 1 application topically daily as needed. For head      clopidogrel 75 MG tablet   Commonly known as: PLAVIX   Take 75 mg by mouth daily.      ezetimibe 10 MG tablet   Commonly known as: ZETIA   Take 10 mg by mouth daily.      FLOMAX 0.4 MG Caps   Generic drug: Tamsulosin HCl   Take 0.4 mg by mouth daily.      folic acid 800 MCG tablet   Commonly known as: FOLVITE   Take 800 mcg by mouth daily.      furosemide 40 MG tablet   Commonly known as: LASIX   Take 2 tablets in the am and 1 tablet in the pm daily by mouth      Iron 325 (65 FE) MG Tabs   Take 1 tablet by mouth daily.      isosorbide mononitrate 60 MG 24 hr tablet   Commonly known as: IMDUR   Take 1 tablet (60 mg total) by mouth daily.      LAMICTAL ODT PO   Take 200 mg by mouth daily.      magnesium oxide 400 MG tablet   Commonly known as: MAG-OX   Take 800 mg by mouth 2 (two) times daily.      mulitivitamin with minerals Tabs   Take 1 tablet by mouth daily.      NOVOLOG [place]   Inject into the skin continuous. Via pump as directed        ONGLYZA 5 MG Tabs tablet   Generic drug: saxagliptin HCl   Take 5 mg by mouth daily.      oxyCODONE 5 MG immediate release tablet   Commonly known as: Oxy IR/ROXICODONE   Take 1 tablet by mouth 2 (two) times daily as needed. For pain      pantoprazole 40 MG tablet   Commonly known as: PROTONIX     Take 40 mg by mouth 2 (two) times daily.      predniSONE 5 MG tablet   Commonly known as: DELTASONE   Take 5 mg by mouth daily.      pyridostigmine 60 MG tablet   Commonly known as: MESTINON   Take 60 mg by mouth 3 (three) times daily.      risperiDONE 2 MG tablet   Commonly known as: RISPERDAL   Take 1 tablet by mouth Daily.      tacrolimus 5 MG capsule   Commonly known as: PROGRAF  Take 5 mg by mouth 2 (two) times daily.      temazepam 15 MG capsule   Commonly known as: RESTORIL   Take 15 mg by mouth at bedtime as needed. For sleep      tiotropium 18 MCG inhalation capsule   Commonly known as: SPIRIVA   Place 1 capsule (18 mcg total) into inhaler and inhale daily.      VIIBRYD 40 MG Tabs   Generic drug: Vilazodone HCl   Take 20 mg by mouth daily.      vitamin B-12 500 MCG tablet   Commonly known as: CYANOCOBALAMIN   Take 500 mcg by mouth daily.      VITAMIN D (CHOLECALCIFEROL) PO   Take 10,000 Units by mouth 3 (three) times a week. On Monday, Wednesday, and Friday            Trudi Morgenthaler V.

## 2011-06-08 NOTE — Progress Notes (Signed)
We can admit patient to inpatient acute rehab today/CIR. Wife and patient in agreement. Please call [phone] with questions.

## 2011-06-08 NOTE — Progress Notes (Signed)
Overall Plan of Care [facility]) Patient Details Name: John [name] MRN: [id number] DOB: Jan 31, 1941  Diagnosis:  Rehabilitation for deconditioning for complications following lumbar decompression  Primary Diagnosis:    Physical deconditioning Co-morbidities: CAD (coronary artery disease)  a. myoview 1/11: lg inf fixed defect; b. cath 1/11: 3v CAD, c. s/p CABG 1/11 c/b acute renal failure and AFib  .  Atrial fibrillation  .  Chronic diastolic heart failure  a. echo 4/11: EF 45-50%, mod LVH, LAE, inf and septal HK  .  First degree AV block  .  Orthostatic hypotension  pyridostigmine  .  History of renal transplant  .  DM2 (diabetes mellitus, type 2)  .  Diabetic gastroparesis  .  HTN (hypertension)  .  HLD (hyperlipidemia)  .  PAD (peripheral artery disease)  s/p R SFA-Pop atherectomy 11/11  .  Prostate cancer  .  ED (erectile dysfunction)  .  DDD (degenerative disc disease), lumbar  .  Tremor    Functional Problem List  Patient demonstrates impairments in the following areas: Balance, Bladder, Bowel, Cognition, Edema, Endurance, Medication Management, Motor, Nutrition, Pain and Sensory   Basic ADL's: eating, grooming, bathing, dressing and toileting Advanced ADL's: simple meal preparation  Transfers:  bed mobility, bed to chair, toilet, tub/shower and car Locomotion:  ambulation, wheelchair mobility and stairs  Additional Impairments:  Swallowing, Communication  comprehension and Social Cognition   social interaction, problem solving, memory, attention and awareness  Anticipated Outcomes Item Anticipated Outcome  Eating/Swallowing  Min assist  Basic self-care  Supervision, Min assist LB   Tolieting  Supervision  Bowel/Bladder  Min assist  Transfers  supervision  Locomotion  supervision  Communication  supervision  Cognition  Min assist  Pain  Min assist  Safety/Judgment  Min assist  Other     Therapy Plan: PT Frequency: Total of 15 hours  over 7 days Frequency due to: pain and significant deconditioning OT Frequency: Total of 15 hours over 7 days Frequency due to: pain and significant deconditioning SLP Frequency: 1-2 X/day, 30-60 minutes   Team Interventions: Item RN PT OT SLP SW TR Other  Self Care/Advanced ADL Retraining X  x      Neuromuscular Re-Education  x x      Therapeutic Activities  x x x     UE/LE Strength Training/ROM  x x      UE/LE Coordination Activities  x x      Visual/Perceptual Remediation/Compensation         DME/Adaptive Equipment Instruction  x x      Therapeutic Exercise  x x      Balance/Vestibular Training  x x      Patient/Family Education X x x x     Cognitive Remediation/Compensation  x x x     Functional Mobility Training  x x      Ambulation/Gait Training  x       Stair Training  x       Wheelchair Propulsion/Positioning  x       Functional Statistician  x       Insurance account manager    x     Speech/Language Facilitation    x     Bladder Management X        Bowel Management X        Disease Management/Prevention         Pain Management X x x  Medication Management X        Skin Care/Wound Management X        Splinting/Orthotics  x       Discharge Planning  x x      Psychosocial Support   x x                        Team Discharge Planning: Destination:  Home Projected Follow-up:  PT, OT, SLP and Home Health Projected Equipment Needs:  Tub Bench and Wheelchair Patient/family involved in discharge planning:  Yes  MD ELOS: 10-14 days Medical Rehab Prognosis:  Good Assessment: 71 year-old male with multiple medical issues was admitted for lumbar stenosis with radiculopathy. He had complications after L5-S1 decompressive laminectomy and now requires CIR level PT, OT, SLP, 24 7 rehabilitation RN and M.D.

## 2011-06-08 NOTE — Care Management Note (Signed)
CM spoke to RN Roseanne Reno this am about disposition plans. Per Ottie Glazier- pt can transfer to CIR today. RN Roseanne Reno  will call Ottie Glazier when order is written for transfer. Will continue to monitor. Gala Lewandowsky, RN,BSN [phone]

## 2011-06-08 NOTE — H&P (Signed)
Physical Medicine and Rehabilitation Admission H&P    No chief complaint on file. : HPI: 71 year old right-handed male with history of coronary artery disease as well as atrial fibrillation, and diabetes mellitus with insulin pump, renal transplant 2002 creatinine baseline 1.3 and COPD with chronic oxygen admitted March 26 with radiating low back pain. X-rays and imaging revealed  spondylosis and herniated nucleus pulposus L5-S1 with radiculopathy. Underwent bilateral laminotomies and foraminotomies decompression L5 and S1 March 26 per Dr. Danielle Dess. Routine back precautions with no patient required. Postoperative confusion and respiratory distress secondary to congestive heart failure and initially placed on Lasix therapy. On March 31 developed worsening respiratory distress and hypoxia with critical care medicine consulted and was intubated. Patient spiked a fever to 100.7 placed on empiric antibiotics. Blood cultures 3/28 negative. Patient was later extubated 06/02/2011 and monitored closely. Patient did undergo right thoracentesis April 11 for pleural effusion with 400 cc removed by interventional radiology. Patient with bouts of loose stools with a flexiseal placed for a short time. Diet has been slowly advanced to a mechanical soft with thin liquids per speech therapy. 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 Patient with profound deconditioning. M.D. as well as physical therapy as requested physical medicine rehabilitation consult to consider inpatient rehabilitation services . Patient's memory of the above events is poor. He perseverates on his bowels. Review of Systems  Constitutional: Positive for diaphoresis.  Respiratory: Positive for cough and shortness of breath.  Cardiovascular: Positive for orthopnea.  Musculoskeletal: Positive for myalgias.  Neurological: Positive for tremors.    Depression/anxiety All other systems reviewed and are negative   Past Medical History  Diagnosis Date  . CAD (coronary artery disease)     a. myoview 1/11: lg inf fixed defect;   b. cath 1/11: 3v CAD,  c. s/p CABG 1/11 c/b acute renal failure and AFib  . Atrial fibrillation   . Chronic diastolic heart failure     a. echo 4/11: EF 45-50%, mod LVH, LAE, inf and septal HK  . First degree AV block   . Orthostatic hypotension     pyridostigmine  . History of renal transplant   . DM2 (diabetes mellitus, type 2)   . Diabetic gastroparesis   . HTN (hypertension)   . HLD (hyperlipidemia)   . PAD (peripheral artery disease)     s/p R SFA-Pop atherectomy 11/11  . Prostate cancer   . ED (erectile dysfunction)   . DDD (degenerative disc disease), lumbar   . Tremor   . Obesity   . Low testosterone   . Chronic kidney disease     s/p renal transplant 2002  . Pleural effusion, right     loculated  . COPD (chronic obstructive pulmonary disease)   . Anxiety   . Diabetes mellitus     insulin pump   Past Surgical History  Procedure Date  . Coronary artery bypass graft 03/2009  . Cataract extraction   . Vitrectomy   . Tonsillectomy   . Kidney transplant 2002  . Hernia repair   . Prostate cancer rx 2006 brachytherapy   . Prior peritoneal catheter placement   . Lumbar laminectomy/decompression microdiscectomy 05/16/2011    Procedure: LUMBAR LAMINECTOMY/DECOMPRESSION MICRODISCECTOMY 1 LEVEL;  Surgeon: Barnett Abu, MD;  Location: [facility];  Service: Neurosurgery;  Laterality: Bilateral;  Bilateral Lumbar Five-Sacral One Laminectomy   Family History  Problem Relation Age of Onset  . Coronary artery disease  Mother   . Valvular heart disease Father   . Hepatitis      sibling  . Coronary artery disease Sister   . Heart attack Sister   . Tuberculosis Father    Social History:  reports that he quit smoking about 33 years ago. His smoking use included Cigarettes. He has a 40 pack-year  smoking history. He has never used smokeless tobacco. He reports that he does not drink alcohol or use illicit drugs. Allergies: No Known Allergies Medications Prior to Admission  Medication Dose Route Frequency Provider Last Rate Last Dose  . acetaminophen (TYLENOL) tablet 325-650 mg  325-650 mg Oral Q4H PRN Mcarthur Rossetti Angiulli, PA      . albuterol (PROVENTIL) (5 MG/ML) 0.5% nebulizer solution 2.5 mg  2.5 mg Nebulization Q6H Daniel J Angiulli, PA      . albuterol (PROVENTIL) (5 MG/ML) 0.5% nebulizer solution 2.5 mg  2.5 mg Nebulization Q3H PRN Mcarthur Rossetti Angiulli, PA      . antiseptic oral rinse (BIOTENE) solution 15 mL  1 application Mouth Rinse BID Mcarthur Rossetti Angiulli, PA      . aspirin chewable tablet 81 mg  81 mg Per Tube Daily Daniel J Angiulli, PA      . calcium carbonate (OS-CAL - dosed in mg of elemental calcium) tablet 1,000 mg of elemental calcium  2 tablet Per Tube Daily Daniel J Angiulli, PA      . carbidopa-levodopa (SINEMET IR) 25-100 MG per tablet immediate release 1 tablet  1 tablet Per Tube TID Charlton Amor, PA   1 tablet at 06/08/11 1642  . clopidogrel (PLAVIX) tablet 75 mg  75 mg Oral Q breakfast Mcarthur Rossetti Angiulli, PA      . ezetimibe (ZETIA) tablet 10 mg  10 mg Oral Daily Daniel J Angiulli, PA      . fentaNYL (DURAGESIC - dosed mcg/hr) 50 mcg  50 mcg Transdermal Q72H Lars Mage, MD      . ferrous sulfate tablet 325 mg  325 mg Oral Q breakfast Mcarthur Rossetti Angiulli, PA      . folic acid (FOLVITE) tablet 1 mg  1 mg Oral Daily Daniel J Angiulli, PA      . furosemide (LASIX) tablet 80 mg  80 mg Oral Daily Daniel J Angiulli, PA      . insulin aspart (novoLOG) injection 0-20 Units  0-20 Units Subcutaneous TID WC Mcarthur Rossetti Angiulli, PA   4 Units at 06/08/11 1800  . insulin glargine (LANTUS) injection 15 Units  15 Units Subcutaneous Daily Daniel J Angiulli, PA      . ipratropium (ATROVENT) nebulizer solution 0.5 mg  0.5 mg Nebulization Q6H Daniel J Angiulli, PA      . isosorbide  mononitrate (IMDUR) 24 hr tablet 60 mg  60 mg Oral Daily Daniel J Angiulli, PA      . lamoTRIgine (LAMICTAL) tablet 200 mg  200 mg Oral Daily Daniel J Angiulli, PA      . magnesium oxide (MAG-OX) tablet 400 mg  400 mg Oral BID Mcarthur Rossetti Angiulli, PA      . mycophenolate (CELLCEPT) 200 MG/ML suspension 500 mg  500 mg Oral BID Erick Colace, MD      . ondansetron [facility]) tablet 4 mg  4 mg Oral Q6H PRN Mcarthur Rossetti Angiulli, PA       Or  . ondansetron (ZOFRAN) injection 4 mg  4 mg Intravenous Q6H PRN Charlton Amor, PA      .  oxyCODONE (Oxy IR/ROXICODONE) immediate release tablet 5 mg  5 mg Oral Q6H PRN Mcarthur Rossetti Angiulli, PA   5 mg at 06/08/11 1642  . pantoprazole (PROTONIX) EC tablet 40 mg  40 mg Oral BID AC Daniel J Angiulli, PA   40 mg at 06/08/11 1641  . polyethylene glycol (MIRALAX / GLYCOLAX) packet 17 g  17 g Oral Daily PRN Mcarthur Rossetti Angiulli, PA      . predniSONE 5 MG/5ML solution 5 mg  5 mg Oral Q breakfast Mcarthur Rossetti Angiulli, PA      . pyridostigmine (MESTINON) tablet 60 mg  60 mg Oral TID Lars Mage, MD   60 mg at 06/08/11 1642  . QUEtiapine (SEROQUEL) tablet 50 mg  50 mg Oral QHS Lars Mage, MD      . risperiDONE (RISPERDAL) tablet 1 mg  1 mg Oral QHS Ankit Garg, MD      . sorbitol 70 % solution 30 mL  30 mL Oral Daily PRN Mcarthur Rossetti Angiulli, PA      . tacrolimus (PROGRAF) capsule 5 mg  5 mg Oral BID Charlton Amor, PA      . Tamsulosin HCl (FLOMAX) capsule 0.4 mg  0.4 mg Oral Daily Mcarthur Rossetti Angiulli, PA      . Vilazodone HCl TABS 20 mg  20 mg Oral Daily Mcarthur Rossetti Angiulli, PA      . DISCONTD: 0.45 % sodium chloride infusion   Intravenous Continuous Oretha Milch, MD 20 mL/hr at 06/04/11 1435    . DISCONTD: 0.45 % sodium chloride infusion   Intravenous Continuous Alyson Reedy, MD 10 mL/hr at 06/03/11 2000 10 mL at 06/03/11 2000  . DISCONTD: acetaminophen (TYLENOL) solution 650 mg  650 mg Per Tube Q4H PRN Mariea Stable, MD   650 mg at 06/01/11 0114  . DISCONTD: albuterol  (PROVENTIL) (5 MG/ML) 0.5% nebulizer solution 2.5 mg  2.5 mg Nebulization Q6H Merwyn Katos, MD   2.5 mg at 06/08/11 0806  . DISCONTD: albuterol (PROVENTIL) (5 MG/ML) 0.5% nebulizer solution 2.5 mg  2.5 mg Nebulization Q3H PRN Merwyn Katos, MD      . DISCONTD: antiseptic oral rinse (BIOTENE) solution 15 mL  1 application Mouth Rinse BID Storm Frisk, MD   15 mL at 06/08/11 0834  . DISCONTD: aspirin chewable tablet 81 mg  81 mg Per Tube Daily Mariea Stable, MD   81 mg at 06/08/11 1009  . DISCONTD: calcium carbonate (OS-CAL - dosed in mg of elemental calcium) tablet 1,000 mg of elemental calcium  2 tablet Per Tube Daily Storm Frisk, MD   1,000 mg of elemental calcium at 06/08/11 1009  . DISCONTD: carbidopa-levodopa (SINEMET) 25-100 MG per tablet 1 tablet  1 tablet Per Tube TID Storm Frisk, MD   1 tablet at 06/08/11 1009  . DISCONTD: fentaNYL (DURAGESIC - dosed mcg/hr) 50 mcg  50 mcg Transdermal Q72H Merwyn Katos, MD   50 mcg at 06/07/11 0905  . DISCONTD: fentaNYL (SUBLIMAZE) injection 25-50 mcg  25-50 mcg Intravenous Q2H PRN Cristi Loron, MD   50 mcg at 06/08/11 1131  . DISCONTD: food thickener (RESOURCE THICKENUP CLEAR) powder 120 g  1 Can Oral PRN Oretha Milch, MD      . DISCONTD: hydrALAZINE (APRESOLINE) injection 10 mg  10 mg Intravenous Q6H PRN Darnelle Maffucci, MD   10 mg at 06/01/11 0114  . DISCONTD: hydrocortisone cream 1 %   Topical TID PRN Darnelle Maffucci, MD      .  DISCONTD: insulin aspart (novoLOG) injection 0-15 Units  0-15 Units Subcutaneous Q4H Merwyn Katos, MD   5 Units at 06/07/11 0023  . DISCONTD: insulin aspart (novoLOG) injection 0-20 Units  0-20 Units Subcutaneous TID WC Leslye Peer, MD   7 Units at 06/08/11 1253  . DISCONTD: insulin glargine (LANTUS) injection 15 Units  15 Units Subcutaneous Daily Oretha Milch, MD   15 Units at 06/08/11 1010  . DISCONTD: ipratropium (ATROVENT) nebulizer solution 0.5 mg  0.5 mg Nebulization Q6H Merwyn Katos, MD    0.5 mg at 06/08/11 0806  . DISCONTD: lamoTRIgine (LAMICTAL) tablet 200 mg  200 mg Oral Daily Barnett Abu, MD   200 mg at 06/08/11 1009  . DISCONTD: mycophenolate (CELLCEPT) 200 MG/ML suspension 500 mg  500 mg Oral BID Storm Frisk, MD   500 mg at 06/08/11 1000  . DISCONTD: ondansetron (ZOFRAN) injection 4 mg  4 mg Intravenous Q4H PRN Barnett Abu, MD   4 mg at 06/07/11 0902  . DISCONTD: pantoprazole (PROTONIX) EC tablet 40 mg  40 mg Oral BID AC Storm Frisk, MD   40 mg at 06/08/11 1610  . DISCONTD: predniSONE 5 MG/5ML solution 5 mg  5 mg Oral Q breakfast Zada Girt, MD   5 mg at 06/08/11 0800  . DISCONTD: pyridostigmine (MESTINON) tablet 30 mg  30 mg Oral TID Laurey Morale, MD   30 mg at 06/08/11 1009  . DISCONTD: QUEtiapine (SEROQUEL) tablet 100 mg  100 mg Oral QHS Merwyn Katos, MD   100 mg at 06/07/11 2100  . DISCONTD: sodium chloride 0.9 % injection 10-40 mL  10-40 mL Intracatheter Q12H Storm Frisk, MD   20 mL at 06/07/11 2101  . DISCONTD: sodium chloride 0.9 % injection 10-40 mL  10-40 mL Intracatheter PRN Storm Frisk, MD      . DISCONTD: tacrolimus (PROGRAF) capsule 5 mg  5 mg Oral BID Darnelle Maffucci, MD   5 mg at 06/08/11 1009  . DISCONTD: Tamsulosin HCl (FLOMAX) capsule 0.4 mg  0.4 mg Oral Daily Oretha Milch, MD   0.4 mg at 06/08/11 1009  . DISCONTD: Tamsulosin HCl (FLOMAX) capsule 0.4 mg  0.4 mg Oral Daily Mcarthur Rossetti Angiulli, PA      . DISCONTD: Vilazodone HCl TABS 20 mg  20 mg Oral Daily Storm Frisk, MD   20 mg at 06/08/11 1008   Medications Prior to Admission  Medication Sig Dispense Refill  . albuterol (PROVENTIL HFA;VENTOLIN HFA) 108 (90 BASE) MCG/ACT inhaler Inhale 2 puffs into the lungs every 6 (six) hours as needed for wheezing.  1 Inhaler  0  . aspirin EC 81 MG tablet Take 1 tablet (81 mg total) by mouth daily.  150 tablet  2  . atorvastatin (LIPITOR) 40 MG tablet Take 40 mg by mouth daily.        . calcium carbonate (OS-CAL - DOSED IN MG OF  ELEMENTAL CALCIUM) 1250 MG tablet Take 2 tablets by mouth daily.      . carbidopa-levodopa (SINEMET CR) 25-100 MG per tablet Take 1 tablet by mouth 3 (three) times daily.        . clobetasol (OLUX) 0.05 % topical foam Apply 1 application topically daily as needed. For head      . clopidogrel (PLAVIX) 75 MG tablet Take 75 mg by mouth daily.      Marland Kitchen ezetimibe (ZETIA) 10 MG tablet Take 10 mg by mouth daily.        Marland Kitchen  Ferrous Sulfate (IRON) 325 (65 FE) MG TABS Take 1 tablet by mouth daily.       . Fluticasone-Salmeterol (ADVAIR DISKUS) 250-50 MCG/DOSE AEPB Inhale 1 puff into the lungs every 12 (twelve) hours.        . folic acid (FOLVITE) 800 MCG tablet Take 800 mcg by mouth daily.      . furosemide (LASIX) 40 MG tablet Take 2 tablets in the am and 1 tablet in the pm daily by mouth  90 tablet  2  . Insulin Aspart (NOVOLOG [place]) Inject into the skin continuous. Via pump as directed       . isosorbide mononitrate (IMDUR) 60 MG 24 hr tablet Take 1 tablet (60 mg total) by mouth daily.  30 tablet  11  . LamoTRIgine (LAMICTAL ODT PO) Take 200 mg by mouth daily.       . magnesium oxide (MAG-OX) 400 MG tablet Take 800 mg by mouth 2 (two) times daily.       . Multiple Vitamin (MULITIVITAMIN WITH MINERALS) TABS Take 1 tablet by mouth daily.      . mycophenolate (CELLCEPT) 500 MG tablet Take 500 mg by mouth 2 (two) times daily.       Marland Kitchen oxyCODONE (OXY IR/ROXICODONE) 5 MG immediate release tablet Take 1 tablet by mouth 2 (two) times daily as needed. For pain      . pantoprazole (PROTONIX) 40 MG tablet Take 40 mg by mouth 2 (two) times daily.       . predniSONE (DELTASONE) 5 MG tablet Take 5 mg by mouth daily.        Marland Kitchen pyridostigmine (MESTINON) 60 MG tablet Take 60 mg by mouth 3 (three) times daily.      . risperiDONE (RISPERDAL) 2 MG tablet Take 1 tablet by mouth Daily.      . saxagliptin HCl (ONGLYZA) 5 MG TABS tablet Take 5 mg by mouth daily.        . tacrolimus (PROGRAF) 5 MG capsule Take 5 mg by mouth 2 (two)  times daily.        . Tamsulosin HCl (FLOMAX) 0.4 MG CAPS Take 0.4 mg by mouth daily.       . temazepam (RESTORIL) 15 MG capsule Take 15 mg by mouth at bedtime as needed. For sleep      . tiotropium (SPIRIVA HANDIHALER) 18 MCG inhalation capsule Place 1 capsule (18 mcg total) into inhaler and inhale daily.  30 capsule  6  . Vilazodone HCl (VIIBRYD) 40 MG TABS Take 20 mg by mouth daily.       . vitamin B-12 (CYANOCOBALAMIN) 500 MCG tablet Take 500 mcg by mouth daily.      Marland Kitchen VITAMIN D, CHOLECALCIFEROL, PO Take 10,000 Units by mouth 3 (three) times a week. On Monday, Wednesday, and Friday      . DISCONTD: Calcium Carb-Cholecalciferol (CALCIUM 1000 + D PO) Take 1 tablet by mouth daily.        Home:     Functional History:    Functional Status:  Mobility:          ADL:    Cognition: Cognition Orientation Level: Oriented X4;Other (comment) (confused conversation at times)     Temperature 98 F (36.7 C), temperature source Oral, resp. rate 24, height 6\' 2"  (1.88 m), weight 101.1 kg (222 lb 14.2 oz), SpO2 96.00%. Physical Exam  Vitals reviewed.  HENT:  Head: Normocephalic.  Neck: Normal range of motion. Neck supple. No thyromegaly present.  Cardiovascular:  Cardiac rate control  Pulmonary/Chest:  Decreased breath sounds at the bases left greater than right  Abdominal: He exhibits no distension. There is no tenderness.  Musculoskeletal:  +1 edema lower extremities.  Neurological: He is alert.  Patient is mildly anxious requesting pain medication. He was able to provide his name, "[place]"and date of birth accurately. He does have some confusion over his long hospital stay and all events that have occurred. He follows to 2- 3 step commands. Pill rolling tremor, intentional tremor ue greater than lower ext. Strength 4/5 in ue and 2/5 left hip flexors knee extensor ankle dorsiflexor and plantar flexor to 4/5 in right hip flexor knee extensor ankle dorsiflexor and plantar  flexor. Sensory exam grossly intact on the right but reduced sensation in the left foot. Withdraws to pain in both legs.  Skin: He does have some skin excoriation of his buttocks  Back incision well healed  Psychiatric: His mood appears mildly anxious anxious. His speech is rapid and/or pressured. He is is hyperactive. He expresses impulsivity and inappropriate judgment. He exhibits abnormal recent memory Tone is reduced in the left lower extremity normal in the right lower extremity normal in bilateral upper extreme  Results for orders placed during the hospital encounter of 06/08/11 (from the past 48 hour(s))  GLUCOSE, CAPILLARY     Status: Abnormal   Collection Time   06/08/11  5:02 PM      Component Value Range Comment   Glucose-Capillary 167 (*) 70 - 99 (mg/dL)    No results found.  Post Admission Physician Evaluation: 1. Functional deficits secondary  to lumbar radiculopathy mainly affecting the left L5 and S1 nerve roots although he does appear to have more proximal weakness as well on the left side. 2. Patient is admitted to receive collaborative, interdisciplinary care between the physiatrist, rehab nursing staff, and therapy team. 3. Patient's level of medical complexity and substantial therapy needs in context of that medical necessity cannot be provided at a lesser intensity of care such as a SNF. 4. Patient has experienced substantial functional loss from his/her baseline which was documented above under the "Functional History" and "Functional Status" headings.  Judging by the patient's diagnosis, physical exam, and functional history, the patient has potential for functional progress which will result in measurable gains while on inpatient rehab.  These gains will be of substantial and practical use upon discharge  in facilitating mobility and self-care at the household level. 5. Physiatrist will provide 24 hour management of medical needs as well as oversight of the therapy  plan/treatment and provide guidance as appropriate regarding the interaction of the two. 6. 24 hour rehab nursing will assist with bladder management, bowel management, safety, skin/wound care, disease management, medication administration, pain management and patient education  and help integrate therapy concepts, techniques,education, etc. 7. PT will assess and treat for:  Pre-gait training gait training endurance safety equipment.  Goals are: Min assist to supervision with mobility. 8. OT will assess and treat for: ADLs, cognitive perceptual skills, endurance, safety, equipment.   Goals are: Supervision upper body and min assist lower body ADLs. 9. SLP will assess and treat for: Attention concentration, memory.  Goals are: Express basic needs as well as personal medical history with 100% accuracy. 10. Case Management and Social Worker will assess and treat for psychological issues and discharge planning. 11. Team conference will be held weekly to assess progress toward goals and to determine barriers to discharge. 12.  Patient will receive at  least 3 hours of therapy per day at least 5 days per week. 13. ELOS and Prognosis: 10-14 days good   Medical Problem List and Plan: 1. lumbar HNP with radiculopathy. Status post laminotomies and facetotomies with postoperative respiratory failure and anoxia 2. Mood/depression/anxiety. Continue Seroquel and Lamictal and vilazodone. Check sleep chart. Discuss cognitive baseline with family. Provide emotional support and positive reinforcement 3. DVT Prophylaxis/Anticoagulation: SCDs. Monitor for deep vein thrombosis 4. Pain Management: Duragesic patch 50 mcg, oxycodone IR 5 mg every 6 hours as needed pain 5. History of renal transplant 2002. Followup renal services as needed. Followup labs in a.m. Latest creatinine 0.9. Continue CellCept, Prograf and prednisone 5 mg daily 6. COPD/status post thoracentesis 06/01/2011. Followup pulmonary services as needed.  Continue respiratory therapy/Advair/Atrovent/Proventil. Patient with chronic oxygen therapy. Check oxygen saturations every shift 7. Diabetes mellitus. Latest hemoglobin A1c of 6.7. Lantus insulin 15 units daily. Patient with insulin pump prior to admission and will discuss with family. 8. Dysphagia/decreased nutritional storage. Followup speech therapy. Monitor for aspiration. Obtain dietary consult 9. Parkinson's disease. Sinemet 25-100 3 times a day 10. 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 Continue aspirin  11. History of orthostatic hypotension. Metinon 30 mg 3 times a day. Monitor with increased  mobility 12. Anemia. Followup CBC/continue Protonix Erick Colace 06/08/2011, 6:52 PM

## 2011-06-08 NOTE — Progress Notes (Signed)
Received patient from [place] RN from 2900; patient Alert to Time, Place, Person, Situation, with confused conversations at time.  Patient on 3LNC with O2 sats WDL.  Assessments complete; review with wife and patient on Safe Patient and Falls prevention.  Patient and wife verbalized understanding.  Discussed with patient and wife importance of repositioning every 2 hours while in bed to prevent skin breakdown.  Discussed plan of care with patient and wife.  Understanding verbalized by wife and patient.

## 2011-06-08 NOTE — Progress Notes (Signed)
Inpatient Diabetes Program Recommendations  AACE/ADA: New Consensus Statement on Inpatient Glycemic Control (2009)  Target Ranges:  Prepandial:   less than 140 mg/dL      Peak postprandial:   less than 180 mg/dL (1-2 hours)      Critically ill patients:  140 - 180 mg/dL   Results for LUKASZ, ROGUS (MRN [id number]) as of 06/08/2011 10:54  Ref. Range 06/07/2011 07:42 06/07/2011 09:59 06/07/2011 11:56 06/07/2011 16:19 06/07/2011 21:39 06/08/2011 07:39  Glucose-Capillary Latest Range: 70-99 mg/dL 409 (H)  811 (H) 914 (H) 153 (H) 197 (H)    Inpatient Diabetes Program Recommendations Insulin - Basal: Increase Lantus to 18 units Insulin - Meal Coverage: .  Thank you  Piedad Climes [facility] Inpatient Diabetes Coordinator [phone]

## 2011-06-09 DIAGNOSIS — IMO0002 Reserved for concepts with insufficient information to code with codable children: Secondary | ICD-10-CM

## 2011-06-09 DIAGNOSIS — R5381 Other malaise: Secondary | ICD-10-CM

## 2011-06-09 DIAGNOSIS — Z5189 Encounter for other specified aftercare: Secondary | ICD-10-CM

## 2011-06-09 LAB — CULTURE, BLOOD (ROUTINE X 2): Culture: NO GROWTH

## 2011-06-09 LAB — CBC
HCT: 25.1 % — ABNORMAL LOW (ref 39.0–52.0)
Hemoglobin: 7.6 g/dL — ABNORMAL LOW (ref 13.0–17.0)
MCV: 71.3 fL — ABNORMAL LOW (ref 78.0–100.0)
RBC: 3.52 MIL/uL — ABNORMAL LOW (ref 4.22–5.81)
WBC: 7.9 10*3/uL (ref 4.0–10.5)

## 2011-06-09 LAB — COMPREHENSIVE METABOLIC PANEL
Alkaline Phosphatase: 96 U/L (ref 39–117)
BUN: 16 mg/dL (ref 6–23)
CO2: 26 mEq/L (ref 19–32)
Chloride: 106 mEq/L (ref 96–112)
Creatinine, Ser: 0.97 mg/dL (ref 0.50–1.35)
GFR calc non Af Amer: 81 mL/min — ABNORMAL LOW (ref >90)
Potassium: 3.9 mEq/L (ref 3.5–5.1)
Total Bilirubin: 0.6 mg/dL (ref 0.3–1.2)

## 2011-06-09 LAB — GLUCOSE, CAPILLARY
Glucose-Capillary: 213 mg/dL — ABNORMAL HIGH (ref 70–99)
Glucose-Capillary: 248 mg/dL — ABNORMAL HIGH (ref 70–99)

## 2011-06-09 LAB — DIFFERENTIAL
Basophils Absolute: 0 10*3/uL (ref 0.0–0.1)
Eosinophils Absolute: 0.3 10*3/uL (ref 0.0–0.7)
Lymphocytes Relative: 19 % (ref 12–46)
Monocytes Absolute: 0.6 10*3/uL (ref 0.1–1.0)
Neutro Abs: 5.5 10*3/uL (ref 1.7–7.7)
Neutrophils Relative %: 70 % (ref 43–77)

## 2011-06-09 LAB — PREPARE RBC (CROSSMATCH)

## 2011-06-09 MED ORDER — OXYCODONE HCL 5 MG PO TABS
10.0000 mg | ORAL_TABLET | Freq: Four times a day (QID) | ORAL | Status: DC | PRN
  Administered 2011-06-09 – 2011-06-10 (×3): 10 mg via ORAL
  Administered 2011-06-11: 5 mg via ORAL
  Filled 2011-06-09 (×2): qty 2
  Filled 2011-06-09: qty 1
  Filled 2011-06-09: qty 2

## 2011-06-09 MED ORDER — FUROSEMIDE 10 MG/ML IJ SOLN
20.0000 mg | Freq: Once | INTRAMUSCULAR | Status: AC
  Administered 2011-06-09: 20 mg via INTRAVENOUS
  Filled 2011-06-09: qty 2

## 2011-06-09 MED ORDER — FUROSEMIDE 10 MG/ML IJ SOLN
20.0000 mg | Freq: Once | INTRAMUSCULAR | Status: AC
  Administered 2011-06-10: 20 mg via INTRAVENOUS
  Filled 2011-06-09: qty 2

## 2011-06-09 MED ORDER — ALTEPLASE 2 MG IJ SOLR
2.0000 mg | INTRAMUSCULAR | Status: AC
  Administered 2011-06-09: 2 mg
  Filled 2011-06-09: qty 2

## 2011-06-09 MED ORDER — SODIUM CHLORIDE 0.9 % IJ SOLN
10.0000 mL | INTRAMUSCULAR | Status: DC | PRN
  Administered 2011-06-10: 30 mL
  Administered 2011-06-10: 10 mL
  Administered 2011-06-10: 30 mL
  Administered 2011-06-11 – 2011-06-20 (×35): 10 mL
  Administered 2011-06-20: 30 mL
  Administered 2011-06-20 (×2): 10 mL
  Administered 2011-06-22: 30 mL
  Administered 2011-06-22 (×3): 10 mL
  Administered 2011-06-23: 30 mL

## 2011-06-09 NOTE — Progress Notes (Signed)
Inpatient Rehabilitation Center Individual Statement of Services  Patient Name:  John [name]  Date:  06/09/2011  Welcome to the Inpatient Rehabilitation Center.  Our goal is to provide you with an individualized program based on your diagnosis and situation, designed to meet your specific needs.  With this comprehensive rehabilitation program, you will be expected to participate in at least 3 hours of rehabilitation therapies Monday-Friday, with modified therapy programming on the weekends.  Your rehabilitation program will include the following services:  Physical Therapy (PT), Occupational Therapy (OT), Speech Therapy (ST), 24 hour per day rehabilitation nursing, Therapeutic Recreaction (TR), Case Management (RN and Child psychotherapist), Rehabilitation Medicine, Nutrition Services and Pharmacy Services  Weekly team conferences will be held on Wednesdays to discuss your progress.  Your RN Case Designer, television/film set will talk with you frequently to get your input and to update you on team discussions.  Team conferences with you and your family in attendance may also be held.  Expected length of stay: 2-3 weeks   Overall anticipated outcome: Minimum assistance - supervision  Depending on your progress and recovery, your program may change.  Your RN Case Estate agent will coordinate services and will keep you informed of any changes.  Your RN Sports coach and SW names and contact numbers are listed  below.  The following services may also be recommended but are not provided by the Inpatient Rehabilitation Center:   Driving Evaluations  Home Health Rehabiltiation Services  Outpatient Rehabilitatation [facility]  Vocational Rehabilitation   Arrangements will be made to provide these services after discharge if needed.  Arrangements include referral to agencies that provide these services.  Your insurance has been verified to be:  Medicare + [place] [employer]BS Your primary doctor is:  Dr  Threasa Beards  Pertinent information will be shared with your doctor and your insurance company.  Case Manager: Lutricia Horsfall, [facility] [phone]  Social Worker:  Dossie Der, [place] [phone]  Information discussed with and copy given to patient by: Meryl Dare, 06/09/2011, 10:13 AM

## 2011-06-09 NOTE — Evaluation (Signed)
Speech Language Pathology Assessment and Plan  Patient Details  Name: John [name] MRN: [id number] Date of Birth: 11-09-1940  SLP Diagnosis: cognitive impairments  Rehab Potential: Good ELOS: 2-3 weeks   Today's Date: 06/09/2011 Time: 1400-1500 Time Calculation (min): 60 min  Therapeutic Intervention: Administered cognitive-linguistic evaluation. Please see below for details.   Problem List:  Patient Active Problem List  Diagnoses  . PURE HYPERCHOLESTEROLEMIA  . HYPERLIPIDEMIA-MIXED  . Coronary atherosclerosis of native coronary artery  . CORONARY ATHEROSLERO UNSPEC TYPE BYPASS GRAFT  . PERICARDIAL EFFUSION  . Atrial fibrillation  . Chronic diastolic heart failure  . PVD WITH CLAUDICATION  . INTERMITTENT CLAUDICATION, BILATERAL  . Orthostatic hypotension  . PLEURAL EFFUSION, RIGHT  . LEG PAIN, RIGHT  . Nonspecific abnormal unspecified cardiovascular function study  . Carotid stenosis  . Chronic kidney disease  . COPD (chronic obstructive pulmonary disease)  . Dyspnea  . First degree AV block  . Back pain  . Spondylosis  . Herniated nucleus pulposus  . Diastolic CHF, acute on chronic  . DM (diabetes mellitus), type 2  . Insulin pump in place  . Acute respiratory failure  . Pulmonary edema  . Delirium due to general medical condition  . Dependence on respirator, status  . Physical deconditioning    Past Medical History:  Past Medical History  Diagnosis Date  . CAD (coronary artery disease)     a. myoview 1/11: lg inf fixed defect;   b. cath 1/11: 3v CAD,  c. s/p CABG 1/11 c/b acute renal failure and AFib  . Atrial fibrillation   . Chronic diastolic heart failure     a. echo 4/11: EF 45-50%, mod LVH, LAE, inf and septal HK  . First degree AV block   . Orthostatic hypotension     pyridostigmine  . History of renal transplant   . DM2 (diabetes mellitus, type 2)   . Diabetic gastroparesis   . HTN (hypertension)   . HLD (hyperlipidemia)   . PAD (peripheral  artery disease)     s/p R SFA-Pop atherectomy 11/11  . Prostate cancer   . ED (erectile dysfunction)   . DDD (degenerative disc disease), lumbar   . Tremor   . Obesity   . Low testosterone   . Chronic kidney disease     s/p renal transplant 2002  . Pleural effusion, right     loculated  . COPD (chronic obstructive pulmonary disease)   . Anxiety   . Diabetes mellitus     insulin pump   Past Surgical History:  Past Surgical History  Procedure Date  . Coronary artery bypass graft 03/2009  . Cataract extraction   . Vitrectomy   . Tonsillectomy   . Kidney transplant 2002  . Hernia repair   . Prostate cancer rx 2006 brachytherapy   . Prior peritoneal catheter placement   . Lumbar laminectomy/decompression microdiscectomy 05/16/2011    Procedure: LUMBAR LAMINECTOMY/DECOMPRESSION MICRODISCECTOMY 1 LEVEL;  Surgeon: Barnett Abu, MD;  Location: [facility];  Service: Neurosurgery;  Laterality: Bilateral;  Bilateral Lumbar Five-Sacral One Laminectomy    Assessment & Plan Clinical Impression: 71 year old right-handed male with history of coronary artery disease as well as atrial fibrillation, and diabetes mellitus with insulin pump, renal transplant 2002 creatinine baseline 1.3 and COPD with chronic oxygen admitted March 26 with radiating low back pain. X-rays and imaging revealed spondylosis and herniated nucleus pulposus L5-S1 with radiculopathy. Underwent bilateral laminotomies and foraminotomies decompression L5 and S1 March 26 per Dr. Danielle Dess.  Routine back precautions required. Postoperative confusion and respiratory distress secondary to congestive heart failure and initially placed on Lasix therapy. On March 31 developed worsening respiratory distress and hypoxia with critical care medicine consulted and was intubated. Patient spiked a fever to 100.7 placed on empiric antibiotics. Blood cultures 3/28 negative. Patient was later extubated 06/02/2011 and monitored closely. Patient did undergo  right thoracentesis April 11 for pleural effusion with 400 cc removed by interventional radiology. Patient with bouts of loose stools with a flexiseal placed for a short time. Diet has been slowly advanced to a mechanical soft with thin liquids per speech therapy. 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 Patient with profound deconditioning. Patient transferred to CIR on 06/08/2011 . Patient demonstrates cognitive impairments characterized by impaired sustained attention, working memory, functional problem solving, initiation, orientation and intellectual awareness. Patient will benefit from skilled SLP intervention to maximize cognitive function and overall independence and to decrease caregiver burden for planned discharge home with 24 hour assist. Anticipate patient will benefit from follow up [facility] at discharge.   SLP - End of Session Patient left: in bed;with call bell/phone within reach Assessment SLP Recommendation/Assessment: Patient will need skilled Speech Lanaguage Pathology Services during CIR admission Rehab Potential: Good Barriers to Discharge:  (participation) Therapy Diagnosis: Cognitive Impairments SLP Plan SLP Frequency: 1-2 X/day, 30-60 minutes SLP Treatment/Interventions: Cognitive remediation/compensation;Cueing hierarchy;Dysphagia/aspiration precaution training;Environmental controls;Functional tasks;Multimodal communication approach;Internal/external aids;Speech/Language facilitation;Therapeutic Activities;Patient/family education Recommendation Follow up Recommendations: Home Health SLP Equipment Recommended: None recommended by SLP  SLP Evaluation Pain: Reports Pain when positioned adjusted in bed, pt premedicated  Cognition Overall Cognitive Status: Impaired Arousal/Alertness: Awake/alert Orientation Level: Oriented to person Attention: Sustained Focused Attention:  Appears intact Sustained Attention: Impaired Sustained Attention Impairment: Verbal basic;Functional basic Problem Solving: Impaired Problem Solving Impairment: Verbal basic;Functional basic Executive Function: Reasoning;Organizing;Initiating;Self Monitoring;Self Correcting;Decision Making;Sequencing Reasoning: Impaired Reasoning Impairment: Verbal basic;Functional basic Sequencing: Impaired Sequencing Impairment: Verbal basic;Functional basic Organizing: Impaired Organizing Impairment: Verbal basic;Functional basic Decision Making: Impaired Decision Making Impairment: Verbal basic;Functional basic Initiating: Impaired Initiating Impairment: Verbal basic;Functional basic Self Monitoring: Impaired Self Monitoring Impairment: Verbal basic;Functional basic Self Correcting: Impaired Self Correcting Impairment: Verbal basic;Functional basic Behaviors: Poor frustration tolerance Safety/Judgment: Impaired Comprehension Auditory Comprehension Yes/No Questions: Impaired Complex Questions: 25-49% accurate Commands: Impaired Two Step Basic Commands: 25-49% accurate Conversation: Simple Interfering Components: Attention;Processing speed;Working Radio broadcast assistant: Dietitian: Not tested Reading Comprehension Reading Status: Not tested Expression Expression Primary Mode of Expression: Verbal Verbal Expression Overall Verbal Expression: Impaired Initiation: Impaired Level of Generative/Spontaneous Verbalization: Sentence;Phrase Repetition: No impairment Naming: No impairment Pragmatics: Impairment Impairments: Abnormal affect;Eye contact;Turn Taking;Topic maintenance Interfering Components: Attention Effective Techniques: Semantic cues Written Expression Dominant Hand: Right Written Expression: Not tested Oral/Motor Oral Motor/Sensory Function Overall Oral Motor/Sensory Function: Appears within  functional limits for tasks assessed Motor Speech Overall Motor Speech: Appears within functional limits for tasks assessed  See FIM for current functional status Refer to Care Plan for Long Term Goals  Recommendations for other services: None  Discharge Criteria: Patient will be discharged from SLP if patient refuses treatment 3 consecutive times without medical reason, if treatment goals not met, if there is a change in medical status, if patient makes no progress towards goals or if patient is discharged from hospital.  The above assessment, treatment plan, treatment alternatives and goals were discussed and mutually agreed upon: by patient  Dosha Broshears 06/09/2011, 4:24 PM  .

## 2011-06-09 NOTE — Evaluation (Signed)
Physical Therapy Assessment and Plan  Patient Details  Name: John [name] MRN: [id number] Date of Birth: 1940/10/17  PT Diagnosis: Abnormal posture, Cognitive deficits, Difficulty walking, Impaired sensation and Low back pain Rehab Potential: Fair ELOS: 2-3 weeks   Today's Date: 06/09/2011 Time: 0900-0950 Time Calculation (min): 50 min  Problem List:  Patient Active Problem List  Diagnoses  . PURE HYPERCHOLESTEROLEMIA  . HYPERLIPIDEMIA-MIXED  . Coronary atherosclerosis of native coronary artery  . CORONARY ATHEROSLERO UNSPEC TYPE BYPASS GRAFT  . PERICARDIAL EFFUSION  . Atrial fibrillation  . Chronic diastolic heart failure  . PVD WITH CLAUDICATION  . INTERMITTENT CLAUDICATION, BILATERAL  . Orthostatic hypotension  . PLEURAL EFFUSION, RIGHT  . LEG PAIN, RIGHT  . Nonspecific abnormal unspecified cardiovascular function study  . Carotid stenosis  . Chronic kidney disease  . COPD (chronic obstructive pulmonary disease)  . Dyspnea  . First degree AV block  . Back pain  . Spondylosis  . Herniated nucleus pulposus  . Diastolic CHF, acute on chronic  . DM (diabetes mellitus), type 2  . Insulin pump in place  . Acute respiratory failure  . Pulmonary edema  . Delirium due to general medical condition  . Dependence on respirator, status  . Physical deconditioning    Past Medical History:  Past Medical History  Diagnosis Date  . CAD (coronary artery disease)     a. myoview 1/11: lg inf fixed defect;   b. cath 1/11: 3v CAD,  c. s/p CABG 1/11 c/b acute renal failure and AFib  . Atrial fibrillation   . Chronic diastolic heart failure     a. echo 4/11: EF 45-50%, mod LVH, LAE, inf and septal HK  . First degree AV block   . Orthostatic hypotension     pyridostigmine  . History of renal transplant   . DM2 (diabetes mellitus, type 2)   . Diabetic gastroparesis   . HTN (hypertension)   . HLD (hyperlipidemia)   . PAD (peripheral artery disease)     s/p R SFA-Pop  atherectomy 11/11  . Prostate cancer   . ED (erectile dysfunction)   . DDD (degenerative disc disease), lumbar   . Tremor   . Obesity   . Low testosterone   . Chronic kidney disease     s/p renal transplant 2002  . Pleural effusion, right     loculated  . COPD (chronic obstructive pulmonary disease)   . Anxiety   . Diabetes mellitus     insulin pump   Past Surgical History:  Past Surgical History  Procedure Date  . Coronary artery bypass graft 03/2009  . Cataract extraction   . Vitrectomy   . Tonsillectomy   . Kidney transplant 2002  . Hernia repair   . Prostate cancer rx 2006 brachytherapy   . Prior peritoneal catheter placement   . Lumbar laminectomy/decompression microdiscectomy 05/16/2011    Procedure: LUMBAR LAMINECTOMY/DECOMPRESSION MICRODISCECTOMY 1 LEVEL;  Surgeon: Barnett Abu, MD;  Location: [facility];  Service: Neurosurgery;  Laterality: Bilateral;  Bilateral Lumbar Five-Sacral One Laminectomy    Assessment & Plan Clinical Impression: Patient is a 71 year old right-handed male with history of coronary artery disease as well as atrial fibrillation, and diabetes mellitus with insulin pump, renal transplant 2002 creatinine baseline 1.3 and COPD with chronic oxygen admitted March 26 with radiating low back pain. X-rays and imaging revealed spondylosis and herniated nucleus pulposus L5-S1 with radiculopathy. Underwent bilateral laminotomies and foraminotomies decompression L5 and S1 March 26 per Dr. Danielle Dess. Routine  back precautions required. Postoperative confusion and respiratory distress secondary to congestive heart failure and initially placed on Lasix therapy. On March 31 developed worsening respiratory distress and hypoxia with critical care medicine consulted and was intubated. Patient spiked a fever to 100.7 placed on empiric antibiotics. Blood cultures 3/28 negative. Patient was later extubated 06/02/2011 and monitored closely. Patient did undergo right thoracentesis  April 11 for pleural effusion with 400 cc removed by interventional radiology. Patient with bouts of loose stools with a flexiseal placed for a short time. Diet has been slowly advanced to a mechanical soft with thin liquids per speech therapy. 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 Patient with profound deconditioning. Patient transferred to CIR on 06/08/2011 .   Patient currently requires total with mobility secondary to muscle weakness, decreased cardiorespiratoy endurance, decreased oxygen support and increased LBP, impaired sensation, R foot drop, decreased attention and decreased problem solving and decreased sitting balance, decreased standing balance, decreased postural control, decreased balance strategies and difficulty maintaining precautions.  Prior to hospitalization, patient was independent with mobility and lived with Spouse in a House home.  Home access is 4Stairs to enter.  Patient will benefit from skilled PT intervention to maximize safe functional mobility, minimize fall risk and decrease caregiver burden for planned discharge home with 24 hour assist.  Anticipate patient will benefit from follow up [facility] at discharge.  PT - End of Session Activity Tolerance: Decreased this session Endurance Deficit: Yes Endurance Deficit Description: pain and significant deconditioning PT Assessment Rehab Potential: Fair Barriers to Discharge: Other (comment) (patient willingness to participate) PT Plan PT Frequency: Total of 15 hours over 7 days Frequency due to: pain and significant deconditioning Estimated Length of Stay: 2-3 weeks PT Treatment/Interventions: Ambulation/gait training;Balance/vestibular training;Cognitive remediation/compensation;Discharge planning;DME/adaptive equipment instruction;Functional electrical stimulation;Functional mobility training;Neuromuscular re-education;Pain  management;Patient/family education;Splinting/orthotics;Stair training;Therapeutic Activities;Therapeutic Exercise;UE/LE Strength taining/ROM;UE/LE Coordination activities;Wheelchair propulsion/positioning PT Recommendation Follow Up Recommendations: Home health PT;24 hour supervision/assistance Equipment Recommended: Wheelchair (measurements)  PT Evaluation Precautions/Restrictions Precautions Precautions: Back;Fall Precaution Comments: No bending, arching, twisting; on supplemental 02 to maintain sats >92% Vital Signs Therapy Vitals Pulse Rate: 93  Oxygen Therapy SpO2: 91 % O2 Device: Nasal cannula O2 Flow Rate (L/min): 2 L/min Pulse Oximetry Type: Intermittent Pain Pain Assessment Pain Assessment: 0-10 Pain Score: 10-Worst pain ever Pain Type: Surgical pain Pain Location: Back Pain Orientation: Lower Pain Descriptors: Aching;Constant Pain Onset: With Activity Pain Intervention(s): Other (Comment);Repositioned;MD notified (Comment) (premedicated; K pad ordered by MD) Home Living/Prior Functioning Home Living Lives With: Spouse Available Help at Discharge: Family Type of Home: House Home Access: Stairs to enter Secretary/administrator of Steps: 4 Entrance Stairs-Rails: Right;Left Home Layout: One level Home Adaptive Equipment: Walker - rolling Prior Function Level of Independence: Independent with transfers;Independent with gait Able to Take Stairs?: Yes Driving: Yes Cognition Overall Cognitive Status: Impaired Attention: Sustained Sustained Attention: Impaired Sustained Attention Impairment: Functional basic (secondary to pain) Problem Solving: Impaired Problem Solving Impairment: Functional basic (secondary to focused on pain) Behaviors: Physical agitation;Poor frustration tolerance Sensation Sensation Light Touch: Impaired by gross assessment Light Touch Impaired Details: Impaired RLE;Impaired LLE (h/o neuropathy; impaired from lumbar HNP) Motor   Motor Motor: Abnormal postural alignment and control Motor - Skilled Clinical Observations: resting tremor, bilat LE weakness R>L, radicular pain, sits with extreme trunk flexion  Mobility Bed Mobility Sit to Supine: 1: +1 Total assist;With rail;1: +2 Total assist Sit to Supine: Patient Percentage: 10% Sit to Supine - Details:  Tactile cues for initiation;Tactile cues for sequencing;Visual cues/gestures for precautions/safety;Visual cues/gestures for sequencing;Verbal cues for sequencing;Verbal cues for technique;Verbal cues for precautions/safety Sit to Supine - Details (indicate cue type and reason): supine > sit with total A secondary to pain, LE weakness and decreased ability to recall and follow back precautions; sit  > supine wiht +3A secondary to pain and agitation Locomotion  Ambulation Ambulation: No (secondary to increased pain) Stairs / Additional Locomotion Stairs: No (secondary to increased pain) Wheelchair Mobility Wheelchair Mobility: No (secondary to increased pain)  Trunk/Postural Assessment  Postural Control Postural Control: Deficits on evaluation Postural Limitations: Sits with extreme upper trunk flexion/forward lean to escape low back pain with bilat UE support on bed rails, unable to sit with erect trunk secondary to pain  Balance Static Sitting Balance Static Sitting - Balance Support: Bilateral upper extremity supported;Feet supported Static Sitting - Level of Assistance: 3: Mod assist Static Sitting - Comment/# of Minutes: EOB 10 minutes; patient became increasingly agitated secondary to pain and refused to remain sitting Extremity Assessment  RLE Assessment RLE Assessment: Exceptions to [facility] RLE Strength RLE Overall Strength: Deficits;Due to pain RLE Overall Strength Comments: 1-2/5 throughout with evidence of R foot drop; also limited by pain LLE Assessment LLE Assessment: Exceptions to [facility] LLE Strength LLE Overall Strength: Deficits LLE Overall Strength  Comments: 3/5 throughout  Following attempt to sit EOB to prepare for transfer patient repositioned in supine with HOB elevated; attempted to perform bilat LE PROM for gastroc and hamstring stretches and bilat heel slides for increased ROM and decreased pain; patient unable to tolerate significant PROM secondary to pain; discussed with wife and patient decreasing therapy to 15 hrs/7 days and starting therapy later in the am to allow pain medication to take effect to increase patient's activity tolerance and participation.  Also requested Kpad for heat to low back for pain relief.  See FIM for current functional status Refer to Care Plan for Long Term Goals  Recommendations for other services: None  Discharge Criteria: Patient will be discharged from PT if patient refuses treatment 3 consecutive times without medical reason, if treatment goals not met, if there is a change in medical status, if patient makes no progress towards goals or if patient is discharged from hospital.  The above assessment, treatment plan, treatment alternatives and goals were discussed and mutually agreed upon: by patient and by family  [place] Desanctis 06/09/2011, 10:27 AM

## 2011-06-09 NOTE — Progress Notes (Signed)
Patient ID: John [name], male   DOB: [birth year], 71 y.o.   MRN: [id number]  Subjective/Complaints: Pain when getting up for therapy.  Wife indicates pt hasn't been up much ROS Objective: Vital Signs: Blood pressure 129/45, pulse 86, temperature 98.2 F (36.8 C), temperature source Oral, resp. rate 18, height 6\' 2"  (1.88 m), weight 101.1 kg (222 lb 14.2 oz), SpO2 93.00%. No results found. Results for orders placed during the hospital encounter of 06/08/11 (from the past 72 hour(s))  GLUCOSE, CAPILLARY     Status: Abnormal   Collection Time   06/08/11  5:02 PM      Component Value Range Comment   Glucose-Capillary 167 (*) 70 - 99 (mg/dL)   GLUCOSE, CAPILLARY     Status: Abnormal   Collection Time   06/08/11  9:27 PM      Component Value Range Comment   Glucose-Capillary 116 (*) 70 - 99 (mg/dL)    Comment 1 Notify RN     CBC     Status: Abnormal   Collection Time   06/09/11  5:30 AM      Component Value Range Comment   WBC 7.9  4.0 - 10.5 (K/uL)    RBC 3.52 (*) 4.22 - 5.81 (MIL/uL)    Hemoglobin 7.6 (*) 13.0 - 17.0 (g/dL)    HCT 30.8 (*) 65.7 - 52.0 (%)    MCV 71.3 (*) 78.0 - 100.0 (fL)    MCH 21.6 (*) 26.0 - 34.0 (pg)    MCHC 30.3  30.0 - 36.0 (g/dL)    RDW 84.6 (*) 96.2 - 15.5 (%)    Platelets 243  150 - 400 (K/uL) PLATELET COUNT CONFIRMED BY SMEAR  COMPREHENSIVE METABOLIC PANEL     Status: Abnormal   Collection Time   06/09/11  5:30 AM      Component Value Range Comment   Sodium 140  135 - 145 (mEq/L)    Potassium 3.9  3.5 - 5.1 (mEq/L)    Chloride 106  96 - 112 (mEq/L)    CO2 26  19 - 32 (mEq/L)    Glucose, Bld 159 (*) 70 - 99 (mg/dL)    BUN 16  6 - 23 (mg/dL)    Creatinine, Ser 9.52  0.50 - 1.35 (mg/dL)    Calcium 9.2  8.4 - 10.5 (mg/dL)    Total Protein 5.7 (*) 6.0 - 8.3 (g/dL)    Albumin 1.9 (*) 3.5 - 5.2 (g/dL)    AST 12  0 - 37 (U/L)    ALT 8  0 - 53 (U/L)    Alkaline Phosphatase 96  39 - 117 (U/L)    Total Bilirubin 0.6  0.3 - 1.2 (mg/dL)    GFR calc non Af  Amer 81 (*) >90 (mL/min)    GFR calc Af Amer >90  >90 (mL/min)   DIFFERENTIAL     Status: Normal   Collection Time   06/09/11  5:30 AM      Component Value Range Comment   Neutrophils Relative 70  43 - 77 (%)    Lymphocytes Relative 19  12 - 46 (%)    Monocytes Relative 7  3 - 12 (%)    Eosinophils Relative 4  0 - 5 (%)    Basophils Relative 0  0 - 1 (%)    Neutro Abs 5.5  1.7 - 7.7 (K/uL)    Lymphs Abs 1.5  0.7 - 4.0 (K/uL)    Monocytes Absolute 0.6  0.1 -  1.0 (K/uL)    Eosinophils Absolute 0.3  0.0 - 0.7 (K/uL)    Basophils Absolute 0.0  0.0 - 0.1 (K/uL)    RBC Morphology POLYCHROMASIA PRESENT     GLUCOSE, CAPILLARY     Status: Abnormal   Collection Time   06/09/11  7:10 AM      Component Value Range Comment   Glucose-Capillary 158 (*) 70 - 99 (mg/dL)    Comment 1 Notify RN       Physical Exam  Vitals reviewed.  HENT:  Head: Normocephalic.  Neck: Normal range of motion. Neck supple. No thyromegaly present.  Cardiovascular:  Cardiac rate control  Pulmonary/Chest:  Decreased breath sounds at the bases left greater than right  Abdominal: He exhibits no distension. There is no tenderness.  Musculoskeletal:  +1 edema lower extremities.  Neurological: He is alert.  Patient is mildly anxious requesting pain medication. He was able to provide his name, "[place]"and date of birth accurately. He does have some confusion over his long hospital stay and all events that have occurred. He follows to 2- 3 step commands. Pill rolling tremor, intentional tremor ue greater than lower ext. Strength 4/5 in ue and 2/5 R hip flexors knee extensor ankle dorsiflexor and plantar flexor to 3/5 in left hip flexor knee extensor ankle dorsiflexor and plantar flexor. Sensory exam grossly intact on the right but reduced sensation in the left foot. Withdraws to pain in both legs.  Skin: He does have some skin excoriation of his buttocks   Assessment/Plan: 1. Functional deficits secondary to Lumbar  radiculopathy s/p L5-S1 decompression with muliple post op complications which require 3+ hours per day of interdisciplinary therapy in a comprehensive inpatient rehab setting. Physiatrist is providing close team supervision and 24 hour management of active medical problems listed below. Physiatrist and rehab team continue to assess barriers to discharge/monitor patient progress toward functional and medical goals. FIM:                   Comprehension Comprehension Mode: Auditory Comprehension: 4-Understands basic 75 - 89% of the time/requires cueing 10 - 24% of the time  Expression Expression Mode: Verbal Expression: 3-Expresses basic 50 - 74% of the time/requires cueing 25 - 50% of the time. Needs to repeat parts of sentences.  Social Interaction Social Interaction: 4-Interacts appropriately 75 - 89% of the time - Needs redirection for appropriate language or to initiate interaction.  Problem Solving Problem Solving: 3-Solves basic 50 - 74% of the time/requires cueing 25 - 49% of the time  Memory Memory: 2-Recognizes or recalls 25 - 49% of the time/requires cueing 51 - 75% of the time  2. Anticoagulation/DVT prophylaxis with non Pharmaceutical:  3. Pain Management: adjust meds Medical Problem List and Plan:  1. lumbar HNP with radiculopathy. Status post laminotomies and facetotomies with postoperative respiratory failure and anoxia  2. Mood/depression/anxiety. Continue Seroquel and Lamictal and vilazodone. Check sleep chart. Discuss cognitive baseline with family. Provide emotional support and positive reinforcement  3. DVT Prophylaxis/Anticoagulation: SCDs. Monitor for deep vein thrombosis  4. Pain Management: Duragesic patch 50 mcg, oxycodone IR 5 mg every 6 hours as needed pain  5. History of renal transplant 2002. Followup renal services as needed. Followup labs in a.m. Latest creatinine 0.9. Continue CellCept, Prograf and prednisone 5 mg daily  6. COPD/status post  thoracentesis 06/01/2011. Followup pulmonary services as needed. Continue respiratory therapy/Advair/Atrovent/Proventil. Patient with chronic oxygen therapy. Check oxygen saturations every shift  7. Diabetes mellitus. Latest hemoglobin  A1c of 6.7. Lantus insulin 15 units daily. Patient with insulin pump prior to admission and will discuss with family.  8. Dysphagia/decreased nutritional storage. Followup speech therapy. Monitor for aspiration. Obtain dietary consult  9. Parkinson's disease. Sinemet 25-100 3 times a day  10. 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 Continue aspirin  11. History of orthostatic hypotension. Metinon 30 mg 3 times a day. Monitor with increased 12.  Anemia- transfuse 2 units today, check stool guaic  LOS (Days) 1 A FACE TO FACE EVALUATION WAS PERFORMED  Iracema Lanagan E 06/09/2011, 9:20 AM

## 2011-06-09 NOTE — Progress Notes (Signed)
Per State Regulation 482.30 This chart was reviewed for medical necessity with respect to the patient's Admission/Duration of stay. Pt very deconditioned. Poor endurance. Team recommending 15hr/7day schedule. Meryl Dare                 Nurse Care Manager            Next Review Date: 06/12/11

## 2011-06-09 NOTE — Progress Notes (Signed)
Social Work Assessment and Plan Social Work Assessment and Plan  Patient Details  Name: John [name] MRN: [id number] Date of Birth: 10-21-1940  Today's Date: 06/09/2011  Problem List:  Patient Active Problem List  Diagnoses  . PURE HYPERCHOLESTEROLEMIA  . HYPERLIPIDEMIA-MIXED  . Coronary atherosclerosis of native coronary artery  . CORONARY ATHEROSLERO UNSPEC TYPE BYPASS GRAFT  . PERICARDIAL EFFUSION  . Atrial fibrillation  . Chronic diastolic heart failure  . PVD WITH CLAUDICATION  . INTERMITTENT CLAUDICATION, BILATERAL  . Orthostatic hypotension  . PLEURAL EFFUSION, RIGHT  . LEG PAIN, RIGHT  . Nonspecific abnormal unspecified cardiovascular function study  . Carotid stenosis  . Chronic kidney disease  . COPD (chronic obstructive pulmonary disease)  . Dyspnea  . First degree AV block  . Back pain  . Spondylosis  . Herniated nucleus pulposus  . Diastolic CHF, acute on chronic  . DM (diabetes mellitus), type 2  . Insulin pump in place  . Acute respiratory failure  . Pulmonary edema  . Delirium due to general medical condition  . Dependence on respirator, status  . Physical deconditioning   Past Medical History:  Past Medical History  Diagnosis Date  . CAD (coronary artery disease)     a. myoview 1/11: lg inf fixed defect;   b. cath 1/11: 3v CAD,  c. s/p CABG 1/11 c/b acute renal failure and AFib  . Atrial fibrillation   . Chronic diastolic heart failure     a. echo 4/11: EF 45-50%, mod LVH, LAE, inf and septal HK  . First degree AV block   . Orthostatic hypotension     pyridostigmine  . History of renal transplant   . DM2 (diabetes mellitus, type 2)   . Diabetic gastroparesis   . HTN (hypertension)   . HLD (hyperlipidemia)   . PAD (peripheral artery disease)     s/p R SFA-Pop atherectomy 11/11  . Prostate cancer   . ED (erectile dysfunction)   . DDD (degenerative disc disease), lumbar   . Tremor   . Obesity   . Low testosterone   . Chronic kidney  disease     s/p renal transplant 2002  . Pleural effusion, right     loculated  . COPD (chronic obstructive pulmonary disease)   . Anxiety   . Diabetes mellitus     insulin pump   Past Surgical History:  Past Surgical History  Procedure Date  . Coronary artery bypass graft 03/2009  . Cataract extraction   . Vitrectomy   . Tonsillectomy   . Kidney transplant 2002  . Hernia repair   . Prostate cancer rx 2006 brachytherapy   . Prior peritoneal catheter placement   . Lumbar laminectomy/decompression microdiscectomy 05/16/2011    Procedure: LUMBAR LAMINECTOMY/DECOMPRESSION MICRODISCECTOMY 1 LEVEL;  Surgeon: Barnett Abu, MD;  Location: [facility];  Service: Neurosurgery;  Laterality: Bilateral;  Bilateral Lumbar Five-Sacral One Laminectomy   Social History:  reports that he quit smoking about 33 years ago. His smoking use included Cigarettes. He has a 40 pack-year smoking history. He has never used smokeless tobacco. He reports that he does not drink alcohol or use illicit drugs.  Family / Support Systems Marital Status: Married Patient Roles: Spouse;Parent Spouse/Significant Other: Carol-wife  [phone]  [phone] Children: Three daughters in the [place] area Anticipated Caregiver: Wife Ability/Limitations of Caregiver: Wife is fairly healthy can do min assist level Caregiver Availability: 24/7 Family Dynamics: Close family, duahgter's have tried to come every week to see Dad,  but need to work around their work schedules.  Social History Preferred language: English Religion: Catholic Cultural Background: No issues Education: Financial planner Read: Yes Write: Yes Employment Status: Retired Fish farm manager Issues: No issues Guardian/Conservator: None   Abuse/Neglect Physical Abuse: Denies Verbal Abuse: Denies Sexual Abuse: Denies Exploitation of patient/patient's resources: Denies Self-Neglect: Denies  Emotional Status Pt's  affect, behavior adn adjustment status: Pt is exhausted from therapies, he is very decondtitioned from this hospitalization.  Wife reports he has been through it this hospitalization.  She reports he is motivated to work and get home. Recent Psychosocial Issues: Other medical issues Pyschiatric History: History of Anxiety takes meds for he feels they help.  Unable to complete Depression Screen due to pt's exhaustion and not willing too.  Will try to complete in next few days.  Wife reports: " He is holding up well considering all that he has been through." Substance Abuse History: No issues  Patient / Family Perceptions, Expectations & Goals Pt/Family understanding of illness & functional limitations: Wife and daughter are able to explain pt's diagnoses and  deconditioning.  They are here daily and involved in his care, very hoepful he will do well here.  He has been here before 2011 and did very well. Premorbid pt/family roles/activities: Husband, Father, Retiree, Home owner, Mining engineer, etc Anticipated changes in roles/activities/participation: Plans to resume upon discharge Pt/family expectations/goals: Pt states: " I want to get well and go home."  Wife states: " I hope he can get back walking and doing for himself, I know it will take time."  She will be here daily to encourage pt in therapies.  Community Resources [employer]: None Premorbid Home Care/DME Agencies: None Transportation available at discharge: [employer] referrals recommended: Support group (specify) (Heart Support Group)  Discharge Planning Living Arrangements: Spouse/significant other Support Systems: Spouse/significant other;Children;Friends/neighbors;Church/faith community Type of Residence: Private residence Insurance Resources: Administrator (specify) Metallurgist) Financial Resources: Restaurant manager, fast food Screen Referred: No Living Expenses: Lives with family Money Management:  Patient;Spouse Do you have any problems obtaining your medications?: No Home Management: Wife Patient/Family Preliminary Plans: Return home with wife who can assist with his care.  She is limited though in what she can provide.  Daughter's are supportive but live out of state. Social Work Anticipated Follow Up Needs: HH/OP;Support Group DC Planning Additional Notes/Comments: Pt is severely deconditioned CIR may be too much for pt, have already dropped down to 15 hr/7 days a week.  Will see after team's eval  Clinical Impression Severely deconditioned male who doesn't want to pushed or participate in therapies, he sat on the edge of the bed today.  Discussed with wife this may be to much for him and his need to  Participate.  Await team's eval and see in the next few days how he participates.  Wife aware will require assist at home.  Lucy Chris 06/09/2011, 1:33 PM

## 2011-06-09 NOTE — Evaluation (Signed)
Occupational Therapy Assessment and Plan  Patient Details  Name: John [name] MRN: [id number] Date of Birth: 1940/11/09  OT Diagnosis: abnormal posture, acute pain, cognitive deficits, lumbago (low back pain) and muscle weakness (generalized) Rehab Potential: Rehab Potential: Fair ELOS: 2-3 weeks   Today's Date: 06/09/2011 Time: 1030-1105 and 1300-1330 Time Calculation (min): 35 min and 30 min  Problem List:  Patient Active Problem List  Diagnoses  . PURE HYPERCHOLESTEROLEMIA  . HYPERLIPIDEMIA-MIXED  . Coronary atherosclerosis of native coronary artery  . CORONARY ATHEROSLERO UNSPEC TYPE BYPASS GRAFT  . PERICARDIAL EFFUSION  . Atrial fibrillation  . Chronic diastolic heart failure  . PVD WITH CLAUDICATION  . INTERMITTENT CLAUDICATION, BILATERAL  . Orthostatic hypotension  . PLEURAL EFFUSION, RIGHT  . LEG PAIN, RIGHT  . Nonspecific abnormal unspecified cardiovascular function study  . Carotid stenosis  . Chronic kidney disease  . COPD (chronic obstructive pulmonary disease)  . Dyspnea  . First degree AV block  . Back pain  . Spondylosis  . Herniated nucleus pulposus  . Diastolic CHF, acute on chronic  . DM (diabetes mellitus), type 2  . Insulin pump in place  . Acute respiratory failure  . Pulmonary edema  . Delirium due to general medical condition  . Dependence on respirator, status  . Physical deconditioning    Past Medical History:  Past Medical History  Diagnosis Date  . CAD (coronary artery disease)     a. myoview 1/11: lg inf fixed defect;   b. cath 1/11: 3v CAD,  c. s/p CABG 1/11 c/b acute renal failure and AFib  . Atrial fibrillation   . Chronic diastolic heart failure     a. echo 4/11: EF 45-50%, mod LVH, LAE, inf and septal HK  . First degree AV block   . Orthostatic hypotension     pyridostigmine  . History of renal transplant   . DM2 (diabetes mellitus, type 2)   . Diabetic gastroparesis   . HTN (hypertension)   . HLD (hyperlipidemia)   .  PAD (peripheral artery disease)     s/p R SFA-Pop atherectomy 11/11  . Prostate cancer   . ED (erectile dysfunction)   . DDD (degenerative disc disease), lumbar   . Tremor   . Obesity   . Low testosterone   . Chronic kidney disease     s/p renal transplant 2002  . Pleural effusion, right     loculated  . COPD (chronic obstructive pulmonary disease)   . Anxiety   . Diabetes mellitus     insulin pump   Past Surgical History:  Past Surgical History  Procedure Date  . Coronary artery bypass graft 03/2009  . Cataract extraction   . Vitrectomy   . Tonsillectomy   . Kidney transplant 2002  . Hernia repair   . Prostate cancer rx 2006 brachytherapy   . Prior peritoneal catheter placement   . Lumbar laminectomy/decompression microdiscectomy 05/16/2011    Procedure: LUMBAR LAMINECTOMY/DECOMPRESSION MICRODISCECTOMY 1 LEVEL;  Surgeon: John Abu, MD;  Location: [facility];  Service: Neurosurgery;  Laterality: Bilateral;  Bilateral Lumbar Five-Sacral One Laminectomy    Assessment & Plan Clinical Impression: Patient is a 71 y.o. year old male with recent admission to the hospital on March 26 with radiating low back pain. X-rays and imaging revealed spondylosis and herniated nucleus pulposus L5-S1 with radiculopathy. Underwent bilateral laminotomies and foraminotomies decompression L5 and S1 March 26 per Dr. Danielle Dess. Routine back precautions required. Postoperative confusion and respiratory distress secondary to congestive  heart failure and initially placed on Lasix therapy. On March 31 developed worsening respiratory distress and hypoxia with critical care medicine consulted and was intubated. Patient spiked a fever to 100.7 placed on empiric antibiotics. Blood cultures 3/28 negative. Patient was later extubated 06/02/2011 and monitored closely. Patient did undergo right thoracentesis April 11 for pleural effusion with 400 cc removed by interventional radiology. Patient with bouts of loose stools  with a flexiseal placed for a short time. Diet has been slowly advanced to a mechanical soft with thin liquids per speech therapy. 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 Patient with profound deconditioning.   Patient transferred to CIR on 06/08/2011 .    Patient currently requires total with basic self-care skills secondary to muscle weakness, decreased cardiorespiratoy endurance and decreased oxygen support, decreased initiation, decreased attention and decreased problem solving and decreased sitting balance, decreased standing balance and difficulty maintaining precautions.  Prior to hospitalization, patient could complete basic self-cares with occasional min assist for transfers in/out of shower and to don socks.  Patient will benefit from skilled intervention to decrease level of assist with basic self-care skills prior to discharge home with care partner.  Anticipate patient will require 24 hour supervision and follow up home health.  OT - End of Session Activity Tolerance: Decreased this session Endurance Deficit: Yes Endurance Deficit Description: pain and significant deconditioning OT Assessment Rehab Potential: Fair OT Plan OT Frequency: Total of 15 hours over 7 days Frequency due to: pain and significant deconditioning Estimated Length of Stay: 2-3 weeks OT Treatment/Interventions: Balance/vestibular training;Cognitive remediation/compensation;Discharge planning;DME/adaptive equipment instruction;Functional mobility training;Patient/family education;Self Care/advanced ADL retraining;Therapeutic Activities;Therapeutic Exercise;UE/LE Coordination activities OT Recommendation Follow Up Recommendations: Home health OT Equipment Recommended: Wheelchair (measurements)  OT Evaluation Precautions/Restrictions  Precautions Precautions: Back;Fall Precaution Comments: No bending, arching,  twisting; on supplemental 02 to maintain sats >92% General Chart Reviewed: Yes Amount of Missed OT Time (min): 25 Minutes Missed Time Reason: Pain Family/Caregiver Present: No Vital Signs Therapy Vitals Temp: 98 F (36.7 C) Temp src: Oral Pulse Rate: 79  Resp: 20  BP: 125/53 mmHg Patient Position, if appropriate: Lying Oxygen Therapy SpO2: 93 % O2 Device: Nasal cannula O2 Flow Rate (L/min): 2 L/min Pulse Oximetry Type: Intermittent Pain Pain Assessment Pain Assessment: 0-10 Pain Score: 10-Worst pain ever Pain Type: Surgical pain Pain Location: Back Pain Orientation: Lower Pain Descriptors: Aching;Constant Pain Onset: On-going Pain Intervention(s): Repositioned;RN made aware (RN premedicated) Home Living/Prior Functioning Home Living Lives With: Spouse Available Help at Discharge: Family Type of Home: House Home Access: Stairs to enter Secretary/administrator of Steps: 4 Entrance Stairs-Rails: Right;Left Home Layout: One level Bathroom Shower/Tub: Engineer, manufacturing systems: Standard Home Adaptive Equipment: Walker - rolling;Hand-held shower hose;Shower chair with back Prior Function Level of Independence: Independent with gait;Needs assistance with ADLs;Independent with transfers Bath:  (occasional assist to get in/out of shower) Dressing: Minimal (occasional assist with shoes) Able to Take Stairs?: Yes Driving: Yes Vocation: Retired ADL ADL Upper Body Bathing: Moderate cueing;Moderate assistance Where Assessed-Upper Body Bathing: Bed level Lower Body Bathing: Dependent Where Assessed-Lower Body Bathing: Bed level Upper Body Dressing: Unable to assess Lower Body Dressing: Unable to assess Toileting: Unable to assess Toilet Transfer: Unable to assess Tub/Shower Transfer: Unable to assess Film/video editor: Unable to assess ADL Comments: Pt required mod assist rolling to Lt to wash buttocks.  Pt very deconditioned and in extreme pain during  evaluation. Vision/Perception    Vision -  History Baseline Vision: Wears glasses only for reading Patient Visual Report: No change from baseline Vision - Assessment Eye Alignment: Within Functional Limits Cognition Overall Cognitive Status: Impaired Attention: Sustained Sustained Attention: Impaired Sustained Attention Impairment: Functional basic (secondary to pain) Problem Solving: Impaired Problem Solving Impairment: Functional basic (secondary to focused on pain) Behaviors: Physical agitation;Poor frustration tolerance Sensation Sensation Light Touch: Impaired by gross assessment Light Touch Impaired Details: Impaired RLE;Impaired LLE (h/o neuropathy; impaired from lumbar HNP) Motor  Motor Motor: Abnormal postural alignment and control Motor - Skilled Clinical Observations: resting tremor, bilat LE weakness R>L, radicular pain, sits with extreme trunk flexion Mobility  Bed Mobility Bed Mobility: Rolling Left Rolling Right: 1: +2 Total assist;With rail Rolling Right: Patient Percentage: 30% Rolling Right Details (indicate cue type and reason): Pt required assist to flex knees and maintain flexed to initiate rolling.  Assist with bed pad. Sit to Supine: 1: +1 Total assist;With rail;1: +2 Total assist Sit to Supine: Patient Percentage: 10% Sit to Supine - Details: Tactile cues for initiation;Tactile cues for sequencing;Visual cues/gestures for precautions/safety;Visual cues/gestures for sequencing;Verbal cues for sequencing;Verbal cues for technique;Verbal cues for precautions/safety Sit to Supine - Details (indicate cue type and reason): supine > sit with total A secondary to pain, LE weakness and decreased ability to recall and follow back precautions; sit  > supine wiht +3A secondary to pain and agitation  Trunk/Postural Assessment  Postural Control Postural Control: Deficits on evaluation Postural Limitations: Sits with extreme upper trunk flexion/forward lean to escape low  back pain with bilat UE support on bed rails, unable to sit with erect trunk secondary to pain  Balance Static Sitting Balance Static Sitting - Balance Support: Bilateral upper extremity supported;Feet supported Static Sitting - Level of Assistance: 3: Mod assist Static Sitting - Comment/# of Minutes: EOB 10 minutes; patient became increasingly agitated secondary to pain and refused to remain sitting Extremity/Trunk Assessment  RUE Assessment RUE Assessment: Within Functional Limits LUE Assessment LUE Assessment:  Within Functional Limits   Treatment Session: Upon arrival pt supine in bed.  Explained OT to patient and encouraged him to participate in initial eval. Pt reports that he is in increased pain and would prefer to remain in bed.  After much encouragement pt agreed to participate in bathing at bed level.  He required max assist +2 to roll to Lt with use of bed rails and bed pad and assist to bend and move bilat lower extremities.  RN performed cleaning of buttocks and applied barrier cream to small open wound on fleshy part of buttocks.  Pt with loud groans with bed mobility and insisted on remaining in hospital gown at this time.  2nd Session: Pt's wife and daughter present for session.  Engaged in bed mobility with pt participating more with reaching for bed rails and using UEs to roll over.  Rolled right, left, and then right again with pt assisting more each attempt.  Wife requested decreased therapy schedule to allow him to acclimate to this setting and get more endurance.  See FIM for current functional status Refer to Care Plan for Long Term Goals  Recommendations for other services: None  Discharge Criteria: Patient will be discharged from OT if patient refuses treatment 3 consecutive times without medical reason, if treatment goals not met, if there is a change in medical status, if patient makes no progress towards goals or if patient is discharged from hospital.  The above  assessment, treatment plan, treatment alternatives and goals were discussed and  mutually agreed upon: by patient  Leonette Monarch 06/09/2011, 12:24 PM

## 2011-06-09 NOTE — Progress Notes (Signed)
Patient information reviewed and entered into [facility] system by Edna Rede, RN, CRRN, PPS Coordinator.  Information including medical coding and functional independence measure will be reviewed and updated through discharge.     Per nursing patient was given "Data Collection Information Summary for Patients in Inpatient Rehabilitation Facilities with attached "Privacy Act Statement-Health Care Records" upon admission.   

## 2011-06-10 LAB — TYPE AND SCREEN: Antibody Screen: NEGATIVE

## 2011-06-10 LAB — GLUCOSE, CAPILLARY
Glucose-Capillary: 197 mg/dL — ABNORMAL HIGH (ref 70–99)
Glucose-Capillary: 230 mg/dL — ABNORMAL HIGH (ref 70–99)
Glucose-Capillary: 159 mg/dL — ABNORMAL HIGH (ref 70–99)

## 2011-06-10 LAB — CBC
MCH: 22.5 pg — ABNORMAL LOW (ref 26.0–34.0)
MCHC: 31.1 g/dL (ref 30.0–36.0)
Platelets: 228 10*3/uL (ref 150–400)
RBC: 4 MIL/uL — ABNORMAL LOW (ref 4.22–5.81)
RDW: 21.5 % — ABNORMAL HIGH (ref 11.5–15.5)

## 2011-06-10 NOTE — Progress Notes (Signed)
Patient ID: John [name], male   DOB: Jul 18, 1940, 71 y.o.   MRN: [id number] Patient ID: John [name], male   DOB: [birth year], 70 y.o.   MRN: [id number]  Subjective/Complaints:  4/20.  Stable status post VDRF. Comfortable. Examination nasal oxygen in place.  Cardiovascular exam revealed grade 2/6 systolic murmur. Heart rate approximately 100 ; Chest revealed diminished breath sounds at the bases but essentially clear abdomen benign soft and nondistended. Active bowel sounds. Extremities bilateral ankle braces in place  BP Readings from Last 3 Encounters:  06/10/11 179/78  06/08/11 136/56  06/08/11 136/56   ROS Objective: Vital Signs: Blood pressure 179/78, pulse 89, temperature 99.3 F (37.4 C), temperature source Oral, resp. rate 22, height 6\' 2"  (1.88 m), weight 222 lb 14.2 oz (101.1 kg), SpO2 95.00%. No results found. Results for orders placed during the hospital encounter of 06/08/11 (from the past 72 hour(s))  GLUCOSE, CAPILLARY     Status: Abnormal   Collection Time   06/08/11  5:02 PM      Component Value Range Comment   Glucose-Capillary 167 (*) 70 - 99 (mg/dL)   GLUCOSE, CAPILLARY     Status: Abnormal   Collection Time   06/08/11  9:27 PM      Component Value Range Comment   Glucose-Capillary 116 (*) 70 - 99 (mg/dL)    Comment 1 Notify RN     CBC     Status: Abnormal   Collection Time   06/09/11  5:30 AM      Component Value Range Comment   WBC 7.9  4.0 - 10.5 (K/uL)    RBC 3.52 (*) 4.22 - 5.81 (MIL/uL)    Hemoglobin 7.6 (*) 13.0 - 17.0 (g/dL)    HCT 78.2 (*) 95.6 - 52.0 (%)    MCV 71.3 (*) 78.0 - 100.0 (fL)    MCH 21.6 (*) 26.0 - 34.0 (pg)    MCHC 30.3  30.0 - 36.0 (g/dL)    RDW 21.3 (*) 08.6 - 15.5 (%)    Platelets 243  150 - 400 (K/uL) PLATELET COUNT CONFIRMED BY SMEAR  COMPREHENSIVE METABOLIC PANEL     Status: Abnormal   Collection Time   06/09/11  5:30 AM      Component Value Range Comment   Sodium 140  135 - 145 (mEq/L)    Potassium 3.9  3.5 - 5.1  (mEq/L)    Chloride 106  96 - 112 (mEq/L)    CO2 26  19 - 32 (mEq/L)    Glucose, Bld 159 (*) 70 - 99 (mg/dL)    BUN 16  6 - 23 (mg/dL)    Creatinine, Ser 5.78  0.50 - 1.35 (mg/dL)    Calcium 9.2  8.4 - 10.5 (mg/dL)    Total Protein 5.7 (*) 6.0 - 8.3 (g/dL)    Albumin 1.9 (*) 3.5 - 5.2 (g/dL)    AST 12  0 - 37 (U/L)    ALT 8  0 - 53 (U/L)    Alkaline Phosphatase 96  39 - 117 (U/L)    Total Bilirubin 0.6  0.3 - 1.2 (mg/dL)    GFR calc non Af Amer 81 (*) >90 (mL/min)    GFR calc Af Amer >90  >90 (mL/min)   DIFFERENTIAL     Status: Normal   Collection Time   06/09/11  5:30 AM      Component Value Range Comment   Neutrophils Relative 70  43 - 77 (%)    Lymphocytes  Relative 19  12 - 46 (%)    Monocytes Relative 7  3 - 12 (%)    Eosinophils Relative 4  0 - 5 (%)    Basophils Relative 0  0 - 1 (%)    Neutro Abs 5.5  1.7 - 7.7 (K/uL)    Lymphs Abs 1.5  0.7 - 4.0 (K/uL)    Monocytes Absolute 0.6  0.1 - 1.0 (K/uL)    Eosinophils Absolute 0.3  0.0 - 0.7 (K/uL)    Basophils Absolute 0.0  0.0 - 0.1 (K/uL)    RBC Morphology POLYCHROMASIA PRESENT     GLUCOSE, CAPILLARY     Status: Abnormal   Collection Time   06/09/11  7:10 AM      Component Value Range Comment   Glucose-Capillary 158 (*) 70 - 99 (mg/dL)    Comment 1 Notify RN     PREPARE RBC (CROSSMATCH)     Status: Normal   Collection Time   06/09/11 10:31 AM      Component Value Range Comment   Order Confirmation ORDER PROCESSED BY BLOOD BANK     TYPE AND SCREEN     Status: Normal (Preliminary result)   Collection Time   06/09/11 10:31 AM      Component Value Range Comment   ABO/RH(D) O POS      Antibody Screen NEG      Sample Expiration 06/12/2011      Unit Number [id number]      Blood Component Type RED CELLS,LR      Unit division 00      Status of Unit ISSUED      Transfusion Status OK TO TRANSFUSE      Crossmatch Result Compatible      Unit Number [id number]      Blood Component Type RED CELLS,LR      Unit division 00       Status of Unit ISSUED      Transfusion Status OK TO TRANSFUSE      Crossmatch Result Compatible     GLUCOSE, CAPILLARY     Status: Abnormal   Collection Time   06/09/11 11:30 AM      Component Value Range Comment   Glucose-Capillary 213 (*) 70 - 99 (mg/dL)    Comment 1 Notify RN     GLUCOSE, CAPILLARY     Status: Abnormal   Collection Time   06/09/11  4:51 PM      Component Value Range Comment   Glucose-Capillary 248 (*) 70 - 99 (mg/dL)    Comment 1 Notify RN     GLUCOSE, CAPILLARY     Status: Abnormal   Collection Time   06/09/11  9:26 PM      Component Value Range Comment   Glucose-Capillary 181 (*) 70 - 99 (mg/dL)    Comment 1 Notify RN     GLUCOSE, CAPILLARY     Status: Abnormal   Collection Time   06/10/11  7:33 AM      Component Value Range Comment   Glucose-Capillary 197 (*) 70 - 99 (mg/dL)     Physical Exam  Vitals reviewed.  HENT:  Head: Normocephalic.  Neck: Normal range of motion. Neck supple. No thyromegaly present.  Cardiovascular:  Cardiac rate control  Pulmonary/Chest:  Decreased breath sounds at the bases left greater than right  Abdominal: He exhibits no distension. There is no tenderness.  Musculoskeletal:  +1 edema lower extremities.  Neurological: He is alert.  Patient is mildly  anxious requesting pain medication. He was able to provide his name, "[place] in [place]"and date of birth accurately. He does have some confusion over his long hospital stay and all events that have occurred. He follows to 2- 3 step commands. Pill rolling tremor, intentional tremor ue greater than lower ext. Strength 4/5 in ue and 2/5 R hip flexors knee extensor ankle dorsiflexor and plantar flexor to 3/5 in left hip flexor knee extensor ankle dorsiflexor and plantar flexor. Sensory exam grossly intact on the right but reduced sensation in the left foot. Withdraws to pain in both legs.  Skin: He does have some skin excoriation of his buttocks   Assessment/Plan: 1. Functional deficits  secondary to Lumbar radiculopathy s/p L5-S1 decompression with muliple post op complications which require 3+ hours per day of interdisciplinary therapy in a comprehensive inpatient rehab setting. Physiatrist is providing close team supervision and 24 hour management of active medical problems listed below. Physiatrist and rehab team continue to assess barriers to discharge/monitor patient progress toward functional and medical goals. FIM: FIM - Bathing Bathing Steps Patient Completed: Right Arm;Left Arm Bathing: 1: Total-Patient completes 0-2 of 10 parts or less than 25%  FIM - Upper Body Dressing/Undressing Upper body dressing/undressing: 0: Wears gown/pajamas-no public clothing FIM - Lower Body Dressing/Undressing Lower body dressing/undressing: 0: Wears Oceanographer  FIM - Toileting Toileting: 0: Activity did not occur  FIM - Archivist Transfers: 0-Activity did not occur  FIM - Banker Devices: Bed rails Bed/Chair Transfer: 0: Activity did not occur  FIM - Locomotion: Wheelchair Locomotion: Wheelchair: 0: Activity did not occur FIM - Locomotion: Ambulation Locomotion: Ambulation: 0: Activity did not occur  Comprehension Comprehension Mode: Auditory Comprehension: 4-Understands basic 75 - 89% of the time/requires cueing 10 - 24% of the time  Expression Expression Mode: Verbal Expression: 3-Expresses basic 50 - 74% of the time/requires cueing 25 - 50% of the time. Needs to repeat parts of sentences.  Social Interaction Social Interaction: 3-Interacts appropriately 50 - 74% of the time - May be physically or verbally inappropriate.  Problem Solving Problem Solving: 2-Solves basic 25 - 49% of the time - needs direction more than half the time to initiate, plan or complete simple activities  Memory Memory: 2-Recognizes or recalls 25 - 49% of the time/requires cueing 51 - 75% of the time  2.  Anticoagulation/DVT prophylaxis with non Pharmaceutical:  3. Pain Management: adjust meds Medical Problem List and Plan:  1. lumbar HNP with radiculopathy. Status post laminotomies and facetotomies with postoperative respiratory failure and anoxia  2. Mood/depression/anxiety. Continue Seroquel and Lamictal and vilazodone. Check sleep chart. Discuss cognitive baseline with family. Provide emotional support and positive reinforcement  3. DVT Prophylaxis/Anticoagulation: SCDs. Monitor for deep vein thrombosis  4. Pain Management: Duragesic patch 50 mcg, oxycodone IR 5 mg every 6 hours as needed pain  5. History of renal transplant 2002. Followup renal services as needed. Followup labs in a.m. Latest creatinine 0.9. Continue CellCept, Prograf and prednisone 5 mg daily  6. COPD/status post thoracentesis 06/01/2011. Followup pulmonary services as needed. Continue respiratory therapy/Advair/Atrovent/Proventil. Patient with chronic oxygen therapy. Check oxygen saturations every shift  7. Diabetes mellitus. Latest hemoglobin A1c of 6.7. Lantus insulin 15 units daily. Patient with insulin pump prior to admission and will discuss with family.  8. Dysphagia/decreased nutritional storage. Followup speech therapy. Monitor for aspiration. Obtain dietary consult  9. Parkinson's disease. Sinemet 25-100 3 times a day  10. 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 Continue aspirin  11. History of orthostatic hypotension. Metinon 30 mg 3 times a day. Monitor with increased 12.  Anemia- transfuse 2 units today, check stool guaic  LOS (Days) 2 A FACE TO FACE EVALUATION WAS PERFORMED  Rogelia Boga 06/10/2011, 10:49 AM

## 2011-06-10 NOTE — Progress Notes (Signed)
Occupational Therapy Session Note  Patient Details  Name: John [name] MRN: [id number] Date of Birth: [birth year]  Today's Date: 06/10/2011 Time: 1100-1200 Time Calculation (min): 60 min  Skilled Therapeutic Interventions/Progress Updates: Patient scheduled for Bathing/Dressing but not able to attend or follow commands during OT session.  With Hand over hand assist patient able to minimally attend and follow command to wash face for about 3 seconds.  This patient not able to attend to participate today very well.  Nursing and OT changed patient and sheets while patient in bed. He was not able to complete bed mobility or lateral rolls at all day.    Therapy Documentation   Pain:  Not able to assess with patient nonverbal unaware state today  See FIM for current functional status  Therapy/Group: Individual Therapy  Bud Face [facility] 06/10/2011, 7:02 PM

## 2011-06-10 NOTE — Progress Notes (Signed)
Physical Therapy Session Note  Patient Details  Name: John [name] MRN: [id number] Date of Birth: 07/14/1940  Today's Date: 06/10/2011 Time: 0907-1003 Time Calculation (min): 56 min  Short Term Goals: Week 1:  PT Short Term Goal 1 (Week 1): Patient will perform bed mobility with mod A while recalling 2/3 back precautions PT Short Term Goal 2 (Week 1): Patient will tolerate sitting EOB x 20-30 minutes with improved trunk posture for functional activities PT Short Term Goal 3 (Week 1): Patient will transfer bed <> w/c with mod A with attention to back precautions PT Short Term Goal 4 (Week 1): Patient will perform w/c mobility x 150 on unit with mod A for endurance training PT Short Term Goal 5 (Week 1): Patient will tolerate 5 minutes of dynamic standing balance, endurance and pre-gait training  Skilled Therapeutic Interventions/Progress Updates: focus of treatment: bed mobility and therapeutic exercise performed with LEs, abs to increase strength for functional mobility.  Pt replied many rest breaks due to pain with movement.  Pt did not verbalize except "easy, easy, stop".  When asked what shoe size he wore, he replied "convertible", which his wife stated referred to his car.  Rolling R and L with total assist +2; 8 x 1 each partial sidelying to full sidelying for core activation.   In sidelying, 10 x 1 L and R hip abduction, flexion, adduction active assist; knee flexion, extension with active assist.  PROM bil heel cords.  Pt unable to tolerate hamstring stretching.  Pt with better initiation of R vs L LE movements.  Wife assisted with bed mobility to achieve sidelying.        Therapy Documentation Precautions:  Precautions Precautions: Back;Fall Precaution Comments: No bending, arching, twisting; on supplemental 02 to maintain sats >92% Restrictions Weight Bearing Restrictions: No General:   Vital Signs: Therapy Vitals O2 Device: Nasal cannula O2 Flow Rate (L/min): 2.5  L/min Pain: Pain Assessment Pain Assessment: 0-10 Pain Score:  (unable to rate; LBP with all passive and active movements) Pain Type: Acute pain Pain Location: Back Pain Descriptors: Aching Pain Onset: On-going Pain Intervention(s): Medication (See eMAR)    Other Treatments: Bed positioning in R sidelying at end of session for pressure relief, comfort.    See FIM for current functional status  Therapy/Group: Individual Therapy  [name] 06/10/2011, 10:17 AM

## 2011-06-10 NOTE — Plan of Care (Signed)
Problem: RH BOWEL ELIMINATION Goal: RH STG MANAGE BOWEL WITH ASSISTANCE STG Manage Bowel with minimal Assistance.  Outcome: Not Progressing Patient had large incontinent episode this morning in brief  Problem: RH BLADDER ELIMINATION Goal: RH STG MANAGE BLADDER WITH ASSISTANCE STG Manage Bladder With minimal Assistance  Outcome: Not Progressing Patient has foley catheter intact  Problem: RH SKIN INTEGRITY Goal: RH STG SKIN FREE OF INFECTION/BREAKDOWN Pt will remain free of any new skin breakdown  Outcome: Progressing EPC to sacrum

## 2011-06-10 NOTE — Progress Notes (Signed)
Patient alert to self and date of birth. Patient given oxycodone IR 10mg  given before 0900 this morning for back pain with some relief. Patient appetite poor- medications whole/crushed in puree. Patient responds to some commands, patient verbal at times. Patient family reported some concerns with patient not talking to them as much and seeming more confused at times. Patient vitals stable- 133/59, 97 oxygen, 87 heart rate, 97.4 Temp. Rapid response called just for second opinion. No new orders received. No signs of stroke. Patient is still responding at times to family and staff. Dr Amador Cunas notified of rapid response visit and patient condition. Neuro checks stable. New orders received. Patient family reports patient may just be tired from therapies today. Continue to monitor. Patient still alert to self and family. Patient foley intact, LBM 4/20 incontinent.

## 2011-06-10 NOTE — Evaluation (Signed)
Clinical/Bedside Swallow Evaluation Patient Details  Name: John [name] MRN: [id number] DOB: 1940-09-08 Today's Date: 06/10/2011  Past Medical History: See cognitive-linguistic evaluation dated 06/09/11.   HPI:  71 year old male who underwent back surgery and developed respiratory distress requiring intubation post-op. Pt was intubated 4/3 - 4/12 with history of acute reversible dysphagia s/p CABG operation in 2011 requiring a couple of weeks of thickened liquids due to silent aspiration.   Assessment/Recommendations/Treatment Plan Clinical Impression Statement: Demonstrates a functional oral-pharyngeal swallow, however patient's cognitive and uncooperative behavior impeded a thorough assesment.  Given previous swallow studies in the last 2 weeks, will continue with Dys.3/ thin liquid diet, however if patient continues to not cooperate with solid consistencies (e.g., spitting them out), then puree may be best.  Goals for swallow treatment will focus on timely mastication/oral phase manipulation.  Risk for Aspiration: Mild Other Related Risk Factors: History of dysphagia;History of esophageal-related issues;Cognitive impairment;Decreased respiratory status  Swallow Evaluation Recommendations Diet Recommendations: Dysphagia 3 (Mechanical Soft);Thin liquid Liquid Administration via: Cup;No straw Medication Administration: Whole meds with puree Supervision: Full supervision/cueing for compensatory strategies;Patient able to self feed Compensations: Slow rate;Small sips/bites Postural Changes and/or Swallow Maneuvers: Seated upright 90 degrees Oral Care Recommendations: Oral care BID  Treatment Plan Treatment Plan Recommendations: Therapy as outlined in treatment plan below Speech Therapy Frequency: min 5x/week Treatment Duration: 2 weeks Interventions: Aspiration precaution training;Compensatory techniques;Patient/family education;Diet toleration management by SLP;Trials of upgraded  texture/liquids  Prognosis Prognosis for Safe Diet Advancement: Good Barriers to Reach Goals: Cognitive deficits;Behavior  Swallowing Goals  SLP Swallowing Goals Patient will consume recommended diet without observed clinical signs of aspiration with: Minimal assistance Patient will utilize recommended strategies during swallow to increase swallowing safety with: Minimal cueing   Myra Rude, M.S.,CCC-SLP Pager [phone] 06/10/2011,3:50 PM

## 2011-06-10 NOTE — Progress Notes (Signed)
Called by RN to check on patient with slowed speech and not answering questions.  Upon arrival to patients room, family at bedside, patient lying supine in bed on nasal cannula at 2.5.lpm. As per family concerned because he is not interactive with them as he has been.  Wife states he had a hard day of therapy this morning.  VSS as per RN.  Breath sounds clear and diminished.  Patient is alert and looking around, answers very few questions, He answered yes to pain and yes to change position in bed. Patient also acknowledged he was tired.  Patient did follow a few simple commands,  Patient does move extremities.  RN instructed to call MD and make aware.  No RRT interventions at this time.  RN to call if assistance needed.

## 2011-06-11 LAB — COMPREHENSIVE METABOLIC PANEL
BUN: 12 mg/dL (ref 6–23)
Calcium: 9.4 mg/dL (ref 8.4–10.5)
Creatinine, Ser: 0.95 mg/dL (ref 0.50–1.35)
GFR calc Af Amer: >90 mL/min (ref >90)
Glucose, Bld: 201 mg/dL — ABNORMAL HIGH (ref 70–99)
Total Protein: 6.2 g/dL (ref 6.0–8.3)

## 2011-06-11 LAB — GLUCOSE, CAPILLARY: Glucose-Capillary: 234 mg/dL — ABNORMAL HIGH (ref 70–99)

## 2011-06-11 MED ORDER — IPRATROPIUM BROMIDE 0.02 % IN SOLN
0.5000 mg | Freq: Three times a day (TID) | RESPIRATORY_TRACT | Status: DC
  Administered 2011-06-12 – 2011-06-17 (×7): 0.5 mg via RESPIRATORY_TRACT
  Filled 2011-06-11 (×16): qty 2.5

## 2011-06-11 MED ORDER — ALBUTEROL SULFATE (5 MG/ML) 0.5% IN NEBU
2.5000 mg | INHALATION_SOLUTION | Freq: Three times a day (TID) | RESPIRATORY_TRACT | Status: DC
  Administered 2011-06-12 – 2011-06-17 (×7): 2.5 mg via RESPIRATORY_TRACT
  Filled 2011-06-11 (×16): qty 0.5

## 2011-06-11 MED ORDER — PREDNISONE 5 MG/5ML PO SOLN
5.0000 mg | Freq: Every day | ORAL | Status: DC
  Administered 2011-06-13 – 2011-06-28 (×15): 5 mg via ORAL
  Filled 2011-06-11 (×24): qty 5

## 2011-06-11 NOTE — Progress Notes (Signed)
Patient has been more alert today but still confused. Patient appetite poor- may benefit from ensure supplements through out the day. Patient refused pain medicine this morning. Trying to keep patient turned to side off bottom- Patient leans to right - extremities stiff. Foley intact, last bowel movement still 4/20- incontinent. Patient not calling for assist or attempting to get up at this time. Continue with plan of care.

## 2011-06-11 NOTE — Progress Notes (Signed)
John [name] is a 71 y.o. male  Subjective: Restless night, otherwise doing ok. Family at side  Objective: Vital signs in last 24 hours: Temp:  [97.4 F (36.3 C)-98.6 F (37 C)] 98.6 F (37 C) (04/21 0500) Pulse Rate:  [87-93] 93  (04/21 0500) Resp:  [24-28] 24  (04/21 0500) BP: (133-150)/(59-81) 150/81 mmHg (04/21 0500) SpO2:  [95 %-97 %] 96 % (04/21 0742) Weight change:  Last BM Date: 06/11/11  Intake/Output from previous day: 04/20 0701 - 04/21 0700 In: 120 [P.O.:120] Out: 1725 [Urine:1725] Last cbgs: CBG (last 3)   Basename 06/11/11 0737 06/10/11 2054 06/10/11 1655  GLUCAP 201* 159* 181*     Physical Exam General: No apparent distress    Lungs: Normal effort. Lungs clear to auscultation, no crackles or wheezes. Decreased breath sounds. Cardiovascular: Regular rate and rhythm, trace BLE edema Musculoskeletal:  Neurovascularly intact Neurological: No new neurological deficits Wounds: Clean, dry, intact. No signs of infection.   Lab Results: Results for orders placed during the hospital encounter of 06/08/11 (from the past 24 hour(s))  GLUCOSE, CAPILLARY     Status: Abnormal   Collection Time   06/10/11 11:59 AM      Component Value Range   Glucose-Capillary 258 (*) 70 - 99 (mg/dL)  CBC     Status: Abnormal   Collection Time   06/10/11  1:00 PM      Component Value Range   WBC 7.6  4.0 - 10.5 (K/uL)   RBC 4.00 (*) 4.22 - 5.81 (MIL/uL)   Hemoglobin 9.0 (*) 13.0 - 17.0 (g/dL)   HCT 16.1 (*) 09.6 - 52.0 (%)   MCV 72.3 (*) 78.0 - 100.0 (fL)   MCH 22.5 (*) 26.0 - 34.0 (pg)   MCHC 31.1  30.0 - 36.0 (g/dL)   RDW 04.5 (*) 40.9 - 15.5 (%)   Platelets 228  150 - 400 (K/uL)  GLUCOSE, CAPILLARY     Status: Abnormal   Collection Time   06/10/11  1:28 PM      Component Value Range   Glucose-Capillary 230 (*) 70 - 99 (mg/dL)   Comment 1 Documented in Chart    GLUCOSE, CAPILLARY     Status: Abnormal   Collection Time   06/10/11  4:55 PM      Component Value Range     Glucose-Capillary 181 (*) 70 - 99 (mg/dL)  GLUCOSE, CAPILLARY     Status: Abnormal   Collection Time   06/10/11  8:54 PM      Component Value Range   Glucose-Capillary 159 (*) 70 - 99 (mg/dL)   Comment 1 Notify RN    COMPREHENSIVE METABOLIC PANEL     Status: Abnormal   Collection Time   06/11/11  4:00 AM      Component Value Range   Sodium 140  135 - 145 (mEq/L)   Potassium 3.8  3.5 - 5.1 (mEq/L)   Chloride 103  96 - 112 (mEq/L)   CO2 27  19 - 32 (mEq/L)   Glucose, Bld 201 (*) 70 - 99 (mg/dL)   BUN 12  6 - 23 (mg/dL)   Creatinine, Ser 8.11  0.50 - 1.35 (mg/dL)   Calcium 9.4  8.4 - 91.4 (mg/dL)   Total Protein 6.2  6.0 - 8.3 (g/dL)   Albumin 2.1 (*) 3.5 - 5.2 (g/dL)   AST 12  0 - 37 (U/L)   ALT 5  0 - 53 (U/L)   Alkaline Phosphatase 109  39 -  117 (U/L)   Total Bilirubin 0.9  0.3 - 1.2 (mg/dL)   GFR calc non Af Amer 82 (*) >90 (mL/min)   GFR calc Af Amer >90  >90 (mL/min)  GLUCOSE, CAPILLARY     Status: Abnormal   Collection Time   06/11/11  7:37 AM      Component Value Range   Glucose-Capillary 201 (*) 70 - 99 (mg/dL)     Studies/Results: No results found.  Medications: I have reviewed the patient's current medications.  Assessment: 1. Functional deficits secondary to Lumbar radiculopathy s/p L5-S1 decompression with muliple post op complications which require 3+ hours per day of interdisciplinary therapy in a comprehensive inpatient rehab setting.  Physiatrist is providing close team supervision and 24 hour management of active medical problems listed below.  Physiatrist and rehab team continue to assess barriers to discharge/monitor patient progress toward functional and medical goals.   Plan: 1. lumbar HNP with radiculopathy. Status post laminotomies and facetotomies with postoperative respiratory failure and anoxia  2. Mood/depression/anxiety. Continue Seroquel and Lamictal and vilazodone. Check sleep chart. Discuss cognitive baseline with family. Provide emotional  support and positive reinforcement  3. DVT Prophylaxis/Anticoagulation: SCDs. Monitor for deep vein thrombosis  4. Pain Management: Duragesic patch 50 mcg, oxycodone IR 5 mg every 6 hours as needed pain  5. History of renal transplant 2002. Followup renal services as needed.  Latest creatinine reviewed Continue CellCept, Prograf and prednisone 5 mg daily  6. COPD/status post thoracentesis 06/01/2011. Followup pulmonary services as needed. Continue respiratory therapy/Advair/Atrovent/Proventil. Patient with chronic oxygen therapy. Check oxygen saturations every shift  7. Diabetes mellitus. Latest hemoglobin A1c of 6.7. Lantus insulin 15 units daily. Patient with insulin pump prior to admission.  8. Dysphagia/decreased nutritional storage. Followup speech therapy. Monitor for aspiration. Obtain dietary consult  9. Parkinson's disease. Sinemet 25-100 3 times a day  10. 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 Continue aspirin  11. History of orthostatic hypotension. Metinon 30 mg 3 times a day. Monitor with increased  12. Anemia-   Length of stay, days: 3  IRETON,SUSAN C , PA-C 06/11/2011, 11:23 AM  I agree with above.  Rene Paci, MD 06/11/2011, 11:23 AM

## 2011-06-11 NOTE — Plan of Care (Addendum)
Problem: RH SKIN INTEGRITY Goal: RH STG SKIN FREE OF INFECTION/BREAKDOWN Pt will remain free of any new skin breakdown  Outcome: Not Progressing Patient has redness to bottom- More excoriated in groin area. Sacrum has 2 break down areas middle below sacrum. Trying to keep patient off back with routine turning.

## 2011-06-11 NOTE — Progress Notes (Signed)
Attempted to turn patient at hs, yelled and cursing at staff. C/o BLE pain. At 0200 c/o pain and requested "shot" for pain. Offered PO pain med, but patient refused. Pravalon boots in place. Unable to turn patient completely due to pain and pushing against siderail. Alert and oriented to self only. No attempts to get up without assist. Tawanna Solo

## 2011-06-12 DIAGNOSIS — Z5189 Encounter for other specified aftercare: Secondary | ICD-10-CM

## 2011-06-12 DIAGNOSIS — IMO0002 Reserved for concepts with insufficient information to code with codable children: Secondary | ICD-10-CM

## 2011-06-12 DIAGNOSIS — R5381 Other malaise: Secondary | ICD-10-CM

## 2011-06-12 LAB — BASIC METABOLIC PANEL
Calcium: 9.8 mg/dL (ref 8.4–10.5)
GFR calc Af Amer: >90 mL/min (ref >90)
GFR calc non Af Amer: 81 mL/min — ABNORMAL LOW (ref >90)
Potassium: 4.3 mEq/L (ref 3.5–5.1)
Sodium: 139 mEq/L (ref 135–145)

## 2011-06-12 LAB — GLUCOSE, CAPILLARY
Glucose-Capillary: 224 mg/dL — ABNORMAL HIGH (ref 70–99)
Glucose-Capillary: 297 mg/dL — ABNORMAL HIGH (ref 70–99)
Glucose-Capillary: 202 mg/dL — ABNORMAL HIGH (ref 70–99)
Glucose-Capillary: 169 mg/dL — ABNORMAL HIGH (ref 70–99)

## 2011-06-12 LAB — URINALYSIS, ROUTINE W REFLEX MICROSCOPIC
Nitrite: POSITIVE — AB
Protein, ur: 100 mg/dL — AB
Specific Gravity, Urine: 1.018 (ref 1.005–1.030)
Urobilinogen, UA: 1 mg/dL (ref 0.0–1.0)

## 2011-06-12 LAB — URINE MICROSCOPIC-ADD ON

## 2011-06-12 MED ORDER — CIPROFLOXACIN HCL 250 MG PO TABS
250.0000 mg | ORAL_TABLET | Freq: Two times a day (BID) | ORAL | Status: DC
  Administered 2011-06-12 – 2011-06-15 (×7): 250 mg via ORAL
  Filled 2011-06-12 (×10): qty 1

## 2011-06-12 MED ORDER — INSULIN GLARGINE 100 UNIT/ML ~~LOC~~ SOLN
20.0000 [IU] | Freq: Every day | SUBCUTANEOUS | Status: DC
  Administered 2011-06-13: 20 [IU] via SUBCUTANEOUS

## 2011-06-12 MED ORDER — ENSURE PUDDING PO PUDG
1.0000 | Freq: Three times a day (TID) | ORAL | Status: DC
  Administered 2011-06-12 – 2011-06-28 (×30): 1 via ORAL

## 2011-06-12 MED ORDER — GLUCERNA SHAKE PO LIQD
237.0000 mL | Freq: Two times a day (BID) | ORAL | Status: DC
  Administered 2011-06-12 – 2011-06-16 (×7): 237 mL via ORAL
  Administered 2011-06-17: 12:00:00 via ORAL
  Administered 2011-06-19 – 2011-06-28 (×12): 237 mL via ORAL
  Filled 2011-06-12 (×2): qty 237

## 2011-06-12 NOTE — Plan of Care (Signed)
Problem: RH BLADDER ELIMINATION Goal: RH STG MANAGE BLADDER WITH ASSISTANCE STG Manage Bladder With minimal Assistance  Outcome: Not Progressing Foley inplace     

## 2011-06-12 NOTE — Progress Notes (Signed)
Inpatient Diabetes Program Recommendations  AACE/ADA: New Consensus Statement on Inpatient Glycemic Control (2009)  Target Ranges:  Prepandial:   less than 140 mg/dL      Peak postprandial:   less than 180 mg/dL (1-2 hours)      Critically ill patients:  140 - 180 mg/dL   Reason for Visit: Results for RAYMIE, GIAMMARCO (MRN [id number]) as of 06/12/2011 14:19  Ref. Range 06/11/2011 16:52 06/11/2011 21:16 06/12/2011 07:10 06/12/2011 11:57  Glucose-Capillary Latest Range: 70-99 mg/dL 469 (H) 629 (H) 528 (H) 297 (H)    Note Lantus increased today to 20 units daily.  Of note, prior to admit patient was on insulin pump and his total basal dose was 48.6 units/day.  Will likely need further titration up of Lantus.  May consider Lantus 30 units daily.  Also may benefit from the addition of Novolog meal coverage 4 units tid with meals (hold if patient eats less than 50%).  If meal coverage is added, may consider reducing correction scale to moderate.     Note: Will follow.

## 2011-06-12 NOTE — Progress Notes (Signed)
Physical Therapy Session Note  Patient Details  Name: John [name] MRN: [id number] Date of Birth: Aug 08, 1940  Today's Date: 06/12/2011 Time: 1478-2956 Time Calculation (min): 58 min  Short Term Goals: Week 1:  PT Short Term Goal 1 (Week 1): Patient will perform bed mobility with mod A while recalling 2/3 back precautions PT Short Term Goal 2 (Week 1): Patient will tolerate sitting EOB x 20-30 minutes with improved trunk posture for functional activities PT Short Term Goal 3 (Week 1): Patient will transfer bed <> w/c with mod A with attention to back precautions PT Short Term Goal 4 (Week 1): Patient will perform w/c mobility x 150 on unit with mod A for endurance training PT Short Term Goal 5 (Week 1): Patient will tolerate 5 minutes of dynamic standing balance, endurance and pre-gait training  Therapy Documentation Precautions:  Precautions Precautions: Back;Fall Precaution Comments: No bending, arching, twisting; on supplemental 02 to maintain sats >92% Restrictions Weight Bearing Restrictions: No Pain: Pain Assessment Pain Assessment: Faces Faces Pain Scale: Hurts whole lot Pain Type: Neuropathic pain Pain Location: Leg Pain Orientation: Right;Left Pain Descriptors: Shooting Pain Onset: With Activity Patients Stated Pain Goal: 3 Pain Intervention(s): Rest;Other (Comment) (RN administered Tylenol during therapy session) Other Treatments: Treatments Therapeutic Activity: Patient awake but not alert or oriented to place or situation; sitting in bed with significant R lateral lean; assisted to EOB with +2A to advance each LE to EOB and to bring trunk into upright position; sitting EOB patient presented with significant forward and R pushing, lateral lean.  +2A to reposition on to L elbow x 2 reps in order to reorient to midline; patient reports he feels he is falling; when cued that he is falling to the R patient does not attempt to correct to midline.   +2A to bring trunk into  upright thoracic extension and to maintain head erect with cues for neck retraction and capital extension; patient only able to follow cues to maintain head position and to rotate head to L and R.  After 30-40 minutes static sitting with assistance to maintain UE on thighs patient able to maintain static sitting balance without assistance 5-10 seconds but then begins to lean or push self to the R.  Assisted back into supine position and donned Prevalon boots with +2A.  Patient only c/o pain during L sidelying on elbow; patient's low back muscles noted to be hypertrophied.    See FIM for current functional status  Therapy/Group: Individual Therapy  Edman Circle [facility] 06/12/2011, 4:22 PM

## 2011-06-12 NOTE — Plan of Care (Signed)
Problem: RH SKIN INTEGRITY Goal: RH STG SKIN FREE OF INFECTION/BREAKDOWN Pt will remain free of any new skin breakdown  Outcome: Not Progressing Pt has skin breakdown on sacral area; turn pt q2 hrs with refusal at times

## 2011-06-12 NOTE — Plan of Care (Signed)
Problem: RH BOWEL ELIMINATION Goal: RH STG MANAGE BOWEL WITH ASSISTANCE STG Manage Bowel with minimal Assistance.  Outcome: Not Progressing Pt incontinent of bowel

## 2011-06-12 NOTE — Progress Notes (Signed)
Speech Language Pathology Daily Session Note  Patient Details  Name: John [name] MRN: [id number] Date of Birth: 09-02-1940  Today's Date: 06/12/2011 Time: 5784-6962 Time Calculation (min): 45 min  Short Term Goals: Week 1: SLP Short Term Goal 1 (Week 1): Pt will demonstrate sustained attention to a functional task for ~10 minutes with Mod A verbal cues for redirection SLP Short Term Goal 2 (Week 1): Pt will demonstrate orientation to situation, time and place with Mod A contextual and semantic cues.  SLP Short Term Goal 3 (Week 1): Pt will demonstrate functional problem solving with familiar tasks with Mod A verbal cues. SLP Short Term Goal 4 (Week 1): Pt will demonstrate safety awareness and will utilize call bell to express wants/needs with Mod A verbal and semantic cues.   Skilled Therapeutic Interventions: Session focused on dysphagia treatment and self feeding.  SLP facilitated session with max assist sematic and tactile cues to consume Dys.3 textures and thin liquids via cup; Patient initiated self managing the cup with cough x1 with consecutive cup sips; however, was total assist to consume solids textures which resulted in left sided oral pocketing with suspected decreased sensation, initiation and problem solving to utilize lingual sweep, also SLP provided max assist semantic cues to sustain attention to mastication for more than 2-3 bites.  Educated patient, wife and RN regarding recommendation for down grade to Dys.2 textures.   Daily Session Precautions/Restrictions    FIM:  Comprehension Comprehension Mode: Auditory Comprehension: 1-Understands basic less than 25% of the time/requires cueing 75% of the time Expression Expression Mode: Verbal Expression: 1-Expresses basis less than 25% of the time/requires cueing greater than 75% of the time. Social Interaction Social Interaction: 1-Interacts appropriately less than 25% of the time. May be withdrawn or combative. Problem  Solving Problem Solving: 1-Solves basic less than 25% of the time - needs direction nearly all the time or does not effectively solve problems and may need a restraint for safety Memory Memory: 1-Recognizes or recalls less than 25% of the time/requires cueing greater than 75% of the time FIM - Eating Eating Activity: 1: Helper feeds patient General    Pain Pain Assessment Pain Assessment: No/denies pain Pain Score: 0-No pain Faces Pain Scale: Hurts little more Pain Type: Acute pain Pain Location: Generalized Pain Descriptors: Aching Patients Stated Pain Goal: 3 Pain Intervention(s): Medication (See eMAR)  Therapy/Group: Individual Therapy  Charlane Ferretti., CCC-SLP [phone]  John [name] 06/12/2011, 11:12 AM

## 2011-06-12 NOTE — Progress Notes (Signed)
Per State Regulation 482.30 This chart was reviewed for medical necessity with respect to the patient's Admission/Duration of stay. Rapid Response called over the weekend due to questionable change in neuro status. Pt continues with confusion, lethargy at times. Poor participation in therapies. Reported above to PA.  Meryl Dare                 Nurse Care Manager            Next Review Date: 06/15/11

## 2011-06-12 NOTE — Progress Notes (Signed)
Pt alert and oriented to self. Pt has periods of confusion with delayed response.  Per report pt has had minimal interaction with staff over the weekend. PA aware of pt status.  Pt continues to stay in bed with no transfers done with therapy or staff. Foley in place with foley care done and pt incontinent of bowel.  Pt turn q2 hrs and EPBC applied to buttocks. At times pt refuses to be turned.  Fentanyl patch removed from right shoulder per MD orders.  Pt has very poor appetite with ensure given between meals.  Cont. To monitor patient.

## 2011-06-12 NOTE — Progress Notes (Signed)
Occupational Therapy Session Note  Patient Details  Name: John [name] MRN: [id number] Date of Birth: [birth year]  Today's Date: 06/12/2011 Time: 1100-1200 Time Calculation (min): 60 min  Short Term Goals: Week 1:  OT Short Term Goal 1 (Week 1): Pt will complete UB bathing with min assist in sitting position. OT Short Term Goal 2 (Week 1): Pt will complete LB bathing with mod assist in sit to stand position. OT Short Term Goal 3 (Week 1): Pt will complete UB dressing with mod assist in seated position. OT Short Term Goal 4 (Week 1): Pt will complete LB dressing with mod assist in sit to stand position. OT Short Term Goal 5 (Week 1): Pt will complete stand pivot transfer with max assist to [facility].  Skilled Therapeutic Interventions/Progress Updates:    Pt seen for ADL retraining at bed level.  Pt with decreased arousal, attention, and initiation.  Pt required increased time to follow commands and physical assist with each command.  Pt able to grasp washcloth in Lt hand, but unable to bathe face or chest.  Pt required multiple cues to remain awake during session.  Pt total assist +2 with rolling and even total assist with reaching for bed rails to assist with rolling.  Nursing and OT bathed patient and changed sheets while pt in bed.  Wife set out clothes for pt to wear, he required total assist +2 to don shirt and shorts with rolling side to side.  Therapy Documentation Precautions:  Precautions Precautions: Back;Fall Precaution Comments: No bending, arching, twisting; on supplemental 02 to maintain sats >92% Restrictions Weight Bearing Restrictions: No Pain: Pain Assessment Pain Assessment: No/denies pain Faces Pain Scale: Hurts little more (when rolling and moving BLE)  See FIM for current functional status  Therapy/Group: Individual Therapy  Leonette Monarch 06/12/2011, 12:15 PM

## 2011-06-12 NOTE — Progress Notes (Signed)
INITIAL ADULT NUTRITION ASSESSMENT Date: 06/12/2011   Time: 11:20 AM  Reason for Assessment: Low Braden  ASSESSMENT: Male 71 y.o.  Dx: Physical deconditioning  Hx:  Past Medical History  Diagnosis Date  . CAD (coronary artery disease)     a. myoview 1/11: lg inf fixed defect;   b. cath 1/11: 3v CAD,  c. s/p CABG 1/11 c/b acute renal failure and AFib  . Atrial fibrillation   . Chronic diastolic heart failure     a. echo 4/11: EF 45-50%, mod LVH, LAE, inf and septal HK  . First degree AV block   . Orthostatic hypotension     pyridostigmine  . History of renal transplant   . DM2 (diabetes mellitus, type 2)   . Diabetic gastroparesis   . HTN (hypertension)   . HLD (hyperlipidemia)   . PAD (peripheral artery disease)     s/p R SFA-Pop atherectomy 11/11  . Prostate cancer   . ED (erectile dysfunction)   . DDD (degenerative disc disease), lumbar   . Tremor   . Obesity   . Low testosterone   . Chronic kidney disease     s/p renal transplant 2002  . Pleural effusion, right     loculated  . COPD (chronic obstructive pulmonary disease)   . Anxiety   . Diabetes mellitus     insulin pump   Past Surgical History  Procedure Date  . Coronary artery bypass graft 03/2009  . Cataract extraction   . Vitrectomy   . Tonsillectomy   . Kidney transplant 2002  . Hernia repair   . Prostate cancer rx 2006 brachytherapy   . Prior peritoneal catheter placement   . Lumbar laminectomy/decompression microdiscectomy 05/16/2011    Procedure: LUMBAR LAMINECTOMY/DECOMPRESSION MICRODISCECTOMY 1 LEVEL;  Surgeon: Barnett Abu, MD;  Location: [facility];  Service: Neurosurgery;  Laterality: Bilateral;  Bilateral Lumbar Five-Sacral One Laminectomy   Related Meds:  Scheduled Meds:   . albuterol  2.5 mg Nebulization TID  . antiseptic oral rinse  1 application Mouth Rinse BID  . aspirin  81 mg Per Tube Daily  . calcium carbonate  2 tablet Per Tube Daily  . carbidopa-levodopa  1 tablet Per Tube TID   . clopidogrel  75 mg Oral Q breakfast  . ezetimibe  10 mg Oral Daily  . fentaNYL  50 mcg Transdermal Q72H  . ferrous sulfate  325 mg Oral Q breakfast  . folic acid  1 mg Oral Daily  . furosemide  80 mg Oral Daily  . insulin aspart  0-20 Units Subcutaneous TID WC  . insulin glargine  20 Units Subcutaneous Daily  . ipratropium  0.5 mg Nebulization TID  . isosorbide mononitrate  60 mg Oral Daily  . lamoTRIgine  200 mg Oral Daily  . magnesium oxide  400 mg Oral BID  . mycophenolate  500 mg Oral BID  . pantoprazole  40 mg Oral BID AC  . predniSONE  5 mg Oral Q breakfast  . pyridostigmine  60 mg Oral TID  . QUEtiapine  50 mg Oral QHS  . tacrolimus  5 mg Oral BID  . Tamsulosin HCl  0.4 mg Oral Daily  . Vilazodone HCl  20 mg Oral Daily  . DISCONTD: albuterol  2.5 mg Nebulization Q6H  . DISCONTD: insulin glargine  15 Units Subcutaneous Daily  . DISCONTD: ipratropium  0.5 mg Nebulization Q6H   Continuous Infusions:  PRN Meds:.acetaminophen, albuterol, ondansetron (ZOFRAN) IV, ondansetron, oxyCODONE, polyethylene glycol, sodium chloride, sorbitol  Ht: 6\' 2"  (188 cm)  Wt: 222 lb 14.2 oz (101.1 kg) - question accuracy of weight, pt was 214 lb 2 days ago  Ideal Wt: 86.4 kg % Ideal Wt: 117%  Wt Readings from Last 15 Encounters:  06/08/11 222 lb 14.2 oz (101.1 kg)  06/06/11 214 lb 4.6 oz (97.2 kg)  06/06/11 214 lb 4.6 oz (97.2 kg)  05/10/11 230 lb 2.6 oz (104.4 kg)  04/21/11 227 lb (102.967 kg)  04/17/11 224 lb 9.6 oz (101.878 kg)  03/15/11 223 lb (101.152 kg)  01/31/11 216 lb (97.977 kg)  01/30/11 215 lb 9.6 oz (97.796 kg)  01/24/11 220 lb (99.791 kg)  12/26/10 217 lb (98.431 kg)  12/01/10 212 lb (96.163 kg)  09/15/10 210 lb (95.255 kg)  08/29/10 208 lb (94.348 kg)  08/18/10 208 lb (94.348 kg)  Usual Wt: 230 lb  % Usual Wt: 97%  Body mass index is 28.62 kg/(m^2). Pt is overweight.  Food/Nutrition Related Hx: poor intake since initiation of diet, per wife  Labs:  CMP       Component Value Date/Time   NA 140 06/11/2011 0400   K 3.8 06/11/2011 0400   CL 103 06/11/2011 0400   CO2 27 06/11/2011 0400   GLUCOSE 201* 06/11/2011 0400   BUN 12 06/11/2011 0400   CREATININE 0.95 06/11/2011 0400   CALCIUM 9.4 06/11/2011 0400   PROT 6.2 06/11/2011 0400   ALBUMIN 2.1* 06/11/2011 0400   AST 12 06/11/2011 0400   ALT 5 06/11/2011 0400   ALKPHOS 109 06/11/2011 0400   BILITOT 0.9 06/11/2011 0400   GFRNONAA 82* 06/11/2011 0400   GFRAA >90 06/11/2011 0400   CBG (last 3)   Basename 06/12/11 0710 06/11/11 2116 06/11/11 1652  GLUCAP 224* 234* 226*   Lab Results  Component Value Date   HGBA1C 6.7* 05/16/2011    Intake/Output Summary (Last 24 hours) at 06/12/11 1123 Last data filed at 06/12/11 0400  Gross per 24 hour  Intake    120 ml  Output   1526 ml  Net  -1406 ml   Diet Order: Dysphagia 2  Supplements/Tube Feeding: none  IVF:    Estimated Nutritional Needs:   Kcal: 2150 - 2350 kcal Protein:  120 - 130 grams protein Fluid: 2 - 2.3 L/d  Past medical hx significant for DM with insulin pump, renal transplant, COPD. Underwent bilateral laminotomies and foraminotomies decompression L5 and S1 3/26. Hospital course continued with worsening respiratory distress and hypoxia. Admitted to rehab 4/18 for deconditioning.  Pt followed by RD during acute hospitalization. Per initial nutrition assessment on 4/4, pt's weight down to 209 lb, a 23-lb weight loss since initial acute hospital admission. Noted that this loss was attributed partially to fluid balance but also to fat/muscle loss as well.  Pt was dx with severe malnutrition in the context of acute illness or injury 2/2 to weight loss and intake less then 50% for greater than 5 days at that time. During acute hospitalization, pt was started on EN during intubation. After extubation, pt briefly continued on EN via panda, however was placed on dysphagia 3 diet with thin liquids s/p FEES on 4/16. Intake of diet very poor once diet  initiated, consuming 0-15% of meals and declining snacks/supplements. Intake continues to be poor at this time. Wife reports that pt's "best meal" since advanced to diet is 100% of banana pudding and blackberry cobbler. Pt downgraded to Dys 2 diet this morning. Pt continues to meet criteria for severe malnutrition  in the context of acute illness or injury 2/2 intake less then 50% for greater than 5 days and moderate body fat and muscle mass loss, per wife's report.  Wife agreeable to Glucerna Shake and Ensure Pudding supplements. Pt fatigued at meal times and intake slowly improving daily, per wife.  NUTRITION DIAGNOSIS: -Inadequate oral intake (NI-2.1).  Status: Ongoing  RELATED TO: poor appetite, severe deconditioning, fatigue  AS EVIDENCE BY: poor PO intake x 6 days and decreasing weight trend  MONITORING/EVALUATION(Goals): Goal: Pt to consume >/= 50% of meals and supplements. Monitor: PO intake, weights, labs, I/O's  EDUCATION NEEDS: -No education needs identified at this time  INTERVENTION:  Glucerna Shakes PO BID between meals  Ensure Pudding PO TID with meals/meds  If intake does not improve, recommend short-term enteral nutrition via enteral feeding tube.   Continuous regimen recommendation (to meet 100% of nutrition needs: Initiate Glucerna 1.2 formula at 15 ml/hr, advance by 10 ml/hr every 8 hours, or as tolerated, to goal of 85 ml/hr x 2 hours (anticipate TF to be held for 4 hours daily for participation in therapies). 30 ml Prostat via tube BID. TF regimen will provide: 2184 kcal, 132 grams protein, 1369 ml free water. Will meet 100% of minimum estimated kcal and protein needs.  Nocturnal regimen recommendation (to meet 50% of nutrition needs): Initiate Glucerna 1.2 formula at 15 ml/hr, advance by 10 ml/hr every 8 hours, or as tolerated, to goal of 75 ml/hr x 12 hours. 30 ml Prostat via tube daily. TF regimen will provide: 1152 kcal, 69 grams, 725 ml free water. Will meet 54%  of minimum estimated kcal needs and 58% of minimum estimated protein needs.   Dietitian #: [phone]  DOCUMENTATION CODES Per approved criteria  -Severe malnutrition in the context of acute illness or injury    Adair Laundry 06/12/2011, 11:20 AM

## 2011-06-12 NOTE — Progress Notes (Signed)
 Patient ID: John [name], male   DOB: 03-18-1940, 71 y.o.   MRN: [id number] John [name] is a 71 y.o. male  Subjective: Restless night, otherwise doing ok. Family at side  Objective: Vital signs in last 24 hours: Temp:  [98.9 F (37.2 C)-99.6 F (37.6 C)] 98.9 F (37.2 C) (04/22 0500) Pulse Rate:  [90-94] 94  (04/22 0500) Resp:  [20] 20  (04/22 0500) BP: (133-157)/(60-75) 157/75 mmHg (04/22 0500) SpO2:  [95 %-98 %] 98 % (04/22 0500) Weight change:  Last BM Date: 06/11/11  Intake/Output from previous day: 04/21 0701 - 04/22 0700 In: 240 [P.O.:240] Out: 1526 [Urine:1525; Stool:1] Last cbgs: CBG (last 3)   Basename 06/12/11 0710 06/11/11 2116 06/11/11 1652  GLUCAP 224* 234* 226*     Physical Exam General: No apparent distress    Lungs: Normal effort. Lungs clear to auscultation, no crackles or wheezes. Decreased breath sounds. Cardiovascular: Regular rate and rhythm, trace BLE edema Musculoskeletal:  Neurovascularly intact Neurological: No new neurological deficits Wounds: Clean, dry, intact. No signs of infection.   Lab Results: Results for orders placed during the hospital encounter of 06/08/11 (from the past 24 hour(s))  GLUCOSE, CAPILLARY     Status: Abnormal   Collection Time   06/11/11 11:49 AM      Component Value Range   Glucose-Capillary 266 (*) 70 - 99 (mg/dL)   Comment 1 Notify RN    GLUCOSE, CAPILLARY     Status: Abnormal   Collection Time   06/11/11  4:52 PM      Component Value Range   Glucose-Capillary 226 (*) 70 - 99 (mg/dL)   Comment 1 Notify RN    GLUCOSE, CAPILLARY     Status: Abnormal   Collection Time   06/11/11  9:16 PM      Component Value Range   Glucose-Capillary 234 (*) 70 - 99 (mg/dL)   Comment 1 Notify RN    GLUCOSE, CAPILLARY     Status: Abnormal   Collection Time   06/12/11  7:10 AM      Component Value Range   Glucose-Capillary 224 (*) 70 - 99 (mg/dL)   Comment 1 Notify RN       Studies/Results: No results  found.  Medications: I have reviewed the patient's current medications.  Assessment: 1. Functional deficits secondary to Lumbar radiculopathy s/p L5-S1 decompression with muliple post op complications which require15 hrs per week of interdisciplinary therapy in a comprehensive inpatient rehab setting.  Physiatrist is providing close team supervision and 24 hour management of active medical problems listed below.  Physiatrist and rehab team continue to assess barriers to discharge/monitor patient progress toward functional and medical goals.   Plan: 1. lumbar HNP with radiculopathy. Status post laminotomies and facetotomies with postoperative respiratory failure and anoxia  2. Mood/depression/anxiety. Continue Seroquel and Lamictal and vilazodone. Check sleep chart. Discuss cognitive baseline with family.He is not tolerating movement and has fear of movement, pain meds are likely to confuse him further 3. DVT Prophylaxis/Anticoagulation: SCDs. Monitor for deep vein thrombosis  4. Pain Management: Duragesic patch 50 mcg, oxycodone IR 5 mg every 6 hours as needed pain  5. History of renal transplant 2002. Followup renal services as needed.  Latest creatinine reviewed Continue CellCept, Prograf and prednisone 5 mg daily  6. COPD/status post thoracentesis 06/01/2011. Followup pulmonary services as needed. Continue respiratory therapy/Advair/Atrovent/Proventil. Patient with chronic oxygen therapy. Check oxygen saturations every shift  7. Diabetes mellitus. Latest hemoglobin A1c of 6.7. Lantus insulin 15  units daily. Patient with insulin pump prior to admission.  8. Dysphagia/decreased nutritional storage. Followup speech therapy. Monitor for aspiration. Obtain dietary consult  9. Parkinson's disease. Sinemet 25-100 3 times a day  10. 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 Continue aspirin  11. History of orthostatic hypotension. Metinon 30 mg 3 times a day. Monitor with increased  12. Anemia-   Length of stay, days:  4  , E , PA-C 06/12/2011, 8:50 AM  I agree with above.  Rene Paci, MD 06/12/2011, 8:50 AM

## 2011-06-13 LAB — DIFFERENTIAL
Basophils Relative: 0 % (ref 0–1)
Eosinophils Relative: 2 % (ref 0–5)
Lymphocytes Relative: 15 % (ref 12–46)
Monocytes Relative: 6 % (ref 3–12)
Neutrophils Relative %: 77 % (ref 43–77)

## 2011-06-13 LAB — CBC
Hemoglobin: 10.2 g/dL — ABNORMAL LOW (ref 13.0–17.0)
MCH: 22.6 pg — ABNORMAL LOW (ref 26.0–34.0)
Platelets: 177 10*3/uL (ref 150–400)
RBC: 4.52 MIL/uL (ref 4.22–5.81)
WBC: 8.9 10*3/uL (ref 4.0–10.5)

## 2011-06-13 LAB — GLUCOSE, CAPILLARY
Glucose-Capillary: 221 mg/dL — ABNORMAL HIGH (ref 70–99)
Glucose-Capillary: 208 mg/dL — ABNORMAL HIGH (ref 70–99)

## 2011-06-13 LAB — COMPREHENSIVE METABOLIC PANEL
AST: 11 U/L (ref 0–37)
BUN: 20 mg/dL (ref 6–23)
CO2: 28 mEq/L (ref 19–32)
Calcium: 9.6 mg/dL (ref 8.4–10.5)
Chloride: 100 mEq/L (ref 96–112)
Creatinine, Ser: 1.01 mg/dL (ref 0.50–1.35)
GFR calc non Af Amer: 73 mL/min — ABNORMAL LOW (ref >90)
Total Bilirubin: 0.7 mg/dL (ref 0.3–1.2)

## 2011-06-13 MED ORDER — QUETIAPINE FUMARATE 50 MG PO TABS
50.0000 mg | ORAL_TABLET | Freq: Two times a day (BID) | ORAL | Status: DC
  Administered 2011-06-13 – 2011-06-19 (×13): 50 mg via ORAL
  Filled 2011-06-13 (×15): qty 1

## 2011-06-13 MED ORDER — OXYCODONE HCL 5 MG PO TABS
5.0000 mg | ORAL_TABLET | Freq: Four times a day (QID) | ORAL | Status: DC | PRN
  Administered 2011-06-19 – 2011-06-27 (×6): 5 mg via ORAL
  Filled 2011-06-13 (×6): qty 1

## 2011-06-13 MED ORDER — ALUM & MAG HYDROXIDE-SIMETH 200-200-20 MG/5ML PO SUSP
30.0000 mL | Freq: Four times a day (QID) | ORAL | Status: DC | PRN
  Administered 2011-06-13: 30 mL via ORAL

## 2011-06-13 NOTE — Progress Notes (Signed)
Occupational Therapy Session Note  Patient Details  Name: John [name] MRN: [id number] Date of Birth: [birth year]  Today's Date: 06/13/2011 Time: 1100-1200 Time Calculation (min): 60 min  Short Term Goals: Week 1:  OT Short Term Goal 1 (Week 1): Pt will complete UB bathing with min assist in sitting position. OT Short Term Goal 2 (Week 1): Pt will complete LB bathing with mod assist in sit to stand position. OT Short Term Goal 3 (Week 1): Pt will complete UB dressing with mod assist in seated position. OT Short Term Goal 4 (Week 1): Pt will complete LB dressing with mod assist in sit to stand position. OT Short Term Goal 5 (Week 1): Pt will complete stand pivot transfer with max assist to [facility].  Skilled Therapeutic Interventions/Progress Updates:    Pt seen for ADL retraining with focus on attention, initiation, sequencing - all with bed mobility and transfer to [facility]: Lambert Rhonda W with use of lift and +2 assist.  Pt reporting need to toilet and unable to use bedpan, +2 attempt to sit pt up in bed with total assist.  Use of mechanical lift to transfer pt to [facility] to allow gravity to assist with BM.  Pt was total assist this session, with frequent requests to return him to sitting or to return him to bed.  Pt with forward lean in sitting requiring mod assist to stabilize in sitting upright EOB.  Therapy Documentation Precautions:  Precautions Precautions: Back;Fall Precaution Comments: No bending, arching, twisting; on supplemental 02 to maintain sats >92% Restrictions Weight Bearing Restrictions: No General:   Vital Signs:   Pain: Pain Assessment Pain Assessment: No/denies pain Pain Score:   7 Pain Type: Chronic pain Pain Location: Back Pain Orientation: Lower Pain Intervention(s): RN made aware  See FIM for current functional status  Therapy/Group: Individual Therapy  Leonette Monarch 06/13/2011, 12:22 PM

## 2011-06-13 NOTE — Progress Notes (Signed)
Physical Therapy Session Note  Patient Details  Name: John [name] MRN: [id number] Date of Birth: Mar 17, 1940  Today's Date: 06/13/2011 Time: 4098-1191 Time Calculation (min): 58 min  Short Term Goals: Week 1:  PT Short Term Goal 1 (Week 1): Patient will perform bed mobility with mod A while recalling 2/3 back precautions PT Short Term Goal 2 (Week 1): Patient will tolerate sitting EOB x 20-30 minutes with improved trunk posture for functional activities PT Short Term Goal 3 (Week 1): Patient will transfer bed <> w/c with mod A with attention to back precautions PT Short Term Goal 4 (Week 1): Patient will perform w/c mobility x 150 on unit with mod A for endurance training PT Short Term Goal 5 (Week 1): Patient will tolerate 5 minutes of dynamic standing balance, endurance and pre-gait training  Therapy Documentation Precautions:  Precautions Precautions: Back;Fall Precaution Comments: No bending, arching, twisting; on supplemental 02 to maintain sats >92% Restrictions Weight Bearing Restrictions: No Pain: Pain Assessment Pain Assessment: Faces Faces Pain Scale: Hurts whole lot Pain Type: Acute pain Pain Location: Back Pain Orientation: Lower Pain Intervention(s): Pre-Medicated Other Treatments: Treatments Therapeutic Activity: Patient still with decreased ability to follow commands or sustain attention to basic tasks.  Patient stated to wife he wanted to get into recliner; performed sup > sit EOB with +2A with HOB elevated and bed rail; transferred bed > recliner with Beasy board and +2A; from sitting in recliner with feet supported on floor focused on selective attention and activation of head and trunk muscles to perform lateral and forward leans to bring trunk and head into neutral midline with use of therapy ball for UE support; also performed trunk control and balance training without UE support during ball toss and reaching up and forward and during unilat LE support during  alteranating LE ball kicks.  Patient with very poor sustained attention, poor initiation and fatigued very easily.    See FIM for current functional status  Therapy/Group: Individual Therapy  Edman Circle [facility] 06/13/2011, 5:11 PM

## 2011-06-13 NOTE — Progress Notes (Signed)
Speech Language Pathology Daily Session Note  Patient Details  Name: John [name] MRN: [id number] Date of Birth: 1940-08-20  Today's Date: 06/13/2011 Time: 5956-3875 Time Calculation (min): 45 min  Short Term Goals: Week 1: SLP Short Term Goal 1 (Week 1): Pt will demonstrate sustained attention to a functional task for ~10 minutes with Mod A verbal cues for redirection SLP Short Term Goal 2 (Week 1): Pt will demonstrate orientation to situation, time and place with Mod A contextual and semantic cues.  SLP Short Term Goal 3 (Week 1): Pt will demonstrate functional problem solving with familiar tasks with Mod A verbal cues. SLP Short Term Goal 4 (Week 1): Pt will demonstrate safety awareness and will utilize call bell to express wants/needs with Mod A verbal and semantic cues.   Skilled Therapeutic Interventions: Patient initially presented as confused, upset and refused to participate; however, was more agreeable with wife present.  SLP facilitated session with max assist semantic and tactile cues to attend to self feeding for 2 minutes with wife present and reinforcing; SLP questions if attention or behavior were impacting his ability to self feed.  Consumed Dys.3 textures with minimal oral residue left greater than right and minimal assist semantic cues to take small cup sips.  Patient demonstrated cough response x1 due to portion size; however moderate strength cough appeared to clear suspected penetrates.  Patient required total assist for orientation to place, time and situation with inability to carryover information throughout session.  Patient verbalized repetitively "you have been holding me in this chair for hours and I want out of here."  Daily Session Precautions/Restrictions    FIM:  Comprehension Comprehension Mode: Auditory Comprehension: 2-Understands basic 25 - 49% of the time/requires cueing 51 - 75% of the time Expression Expression Mode: Verbal Expression: 3-Expresses  basic 50 - 74% of the time/requires cueing 25 - 50% of the time. Needs to repeat parts of sentences. Social Interaction Social Interaction: 2-Interacts appropriately 25 - 49% of time - Needs frequent redirection. Problem Solving Problem Solving: 1-Solves basic less than 25% of the time - needs direction nearly all the time or does not effectively solve problems and may need a restraint for safety Memory Memory: 1-Recognizes or recalls less than 25% of the time/requires cueing greater than 75% of the time FIM - Eating Eating Activity: 3: Helper scoops food on utensil every scoop General    Pain Pain Assessment Pain Assessment: No/denies pain Pain Score:   7 Pain Type: Chronic pain Pain Location: Back Pain Orientation: Lower Pain Intervention(s): RN made aware  Therapy/Group: Individual Therapy  Charlane Ferretti., CCC-SLP [phone]  Theone Bowell 06/13/2011, 11:07 AM

## 2011-06-13 NOTE — Progress Notes (Signed)
Patient ID: John [name], male   DOB: 1940/08/30, 71 y.o.   MRN: [id number] John [name] is a 71 y.o. male  Subjective: Yelling help me  Objective: Vital signs in last 24 hours: Temp:  [98.5 F (36.9 C)-98.9 F (37.2 C)] 98.9 F (37.2 C) (04/23 0404) Pulse Rate:  [83-96] 96  (04/23 0404) Resp:  [20] 20  (04/22 1625) BP: (125-164)/(64-77) 164/77 mmHg (04/23 0404) SpO2:  [97 %-98 %] 97 % (04/23 0404) Weight change:  Last BM Date: 06/12/11  Intake/Output from previous day: 04/22 0701 - 04/23 0700 In: 480 [P.O.:480] Out: 400 [Urine:400] Last cbgs: CBG (last 3)   Basename 06/13/11 0716 06/12/11 2159 06/12/11 1641  GLUCAP 221* 169* 202*     Physical Exam Pt refuses exam today Oriented to person only, states he is in hotel.  States that I am not a physician   Lab Results: Results for orders placed during the hospital encounter of 06/08/11 (from the past 24 hour(s))  GLUCOSE, CAPILLARY     Status: Abnormal   Collection Time   06/12/11 11:57 AM      Component Value Range   Glucose-Capillary 297 (*) 70 - 99 (mg/dL)   Comment 1 Notify RN    URINALYSIS, ROUTINE W REFLEX MICROSCOPIC     Status: Abnormal   Collection Time   06/12/11  1:05 PM      Component Value Range   Color, Urine YELLOW  YELLOW    APPearance HAZY (*) CLEAR    Specific Gravity, Urine 1.018  1.005 - 1.030    pH 6.5  5.0 - 8.0    Glucose, UA NEGATIVE  NEGATIVE (mg/dL)   Hgb urine dipstick SMALL (*) NEGATIVE    Bilirubin Urine SMALL (*) NEGATIVE    Ketones, ur NEGATIVE  NEGATIVE (mg/dL)   Protein, ur 409 (*) NEGATIVE (mg/dL)   Urobilinogen, UA 1.0  0.0 - 1.0 (mg/dL)   Nitrite POSITIVE (*) NEGATIVE    Leukocytes, UA MODERATE (*) NEGATIVE   URINE MICROSCOPIC-ADD ON     Status: Abnormal   Collection Time   06/12/11  1:05 PM      Component Value Range   Squamous Epithelial / LPF FEW (*) RARE    WBC, UA 11-20  <3 (WBC/hpf)   RBC / HPF 3-6  <3 (RBC/hpf)   Bacteria, UA MANY (*) RARE    Casts HYALINE  CASTS (*) NEGATIVE   BASIC METABOLIC PANEL     Status: Abnormal   Collection Time   06/12/11  2:45 PM      Component Value Range   Sodium 139  135 - 145 (mEq/L)   Potassium 4.3  3.5 - 5.1 (mEq/L)   Chloride 100  96 - 112 (mEq/L)   CO2 28  19 - 32 (mEq/L)   Glucose, Bld 230 (*) 70 - 99 (mg/dL)   BUN 14  6 - 23 (mg/dL)   Creatinine, Ser 8.11  0.50 - 1.35 (mg/dL)   Calcium 9.8  8.4 - 91.4 (mg/dL)   GFR calc non Af Amer 81 (*) >90 (mL/min)   GFR calc Af Amer >90  >90 (mL/min)  GLUCOSE, CAPILLARY     Status: Abnormal   Collection Time   06/12/11  4:41 PM      Component Value Range   Glucose-Capillary 202 (*) 70 - 99 (mg/dL)   Comment 1 Notify RN    GLUCOSE, CAPILLARY     Status: Abnormal   Collection Time   06/12/11  9:59 PM      Component Value Range   Glucose-Capillary 169 (*) 70 - 99 (mg/dL)   Comment 1 Notify RN    GLUCOSE, CAPILLARY     Status: Abnormal   Collection Time   06/13/11  7:16 AM      Component Value Range   Glucose-Capillary 221 (*) 70 - 99 (mg/dL)   Comment 1 Notify RN       Studies/Results: No results found.  Medications: I have reviewed the patient's current medications.  Assessment: 1. Functional deficits secondary to Lumbar radiculopathy s/p L5-S1 decompression with muliple post op complications which require15 hrs per week of interdisciplinary therapy in a comprehensive inpatient rehab setting.  Physiatrist is providing close team supervision and 24 hour management of active medical problems listed below.  Physiatrist and rehab team continue to assess barriers to discharge/monitor patient progress toward functional and medical goals.   Plan: 1. lumbar HNP with radiculopathy. Status post laminotomies and facetotomies with postoperative respiratory failure and anoxia  2. Mood/depression/anxiety. Continue Seroquel and Lamictal and vilazodone. Check sleep chart. Discuss cognitive baseline with family.He is not tolerating movement and has fear of movement,  pain meds are likely to confuse him further 3. DVT Prophylaxis/Anticoagulation: SCDs. Monitor for deep vein thrombosis  4. Pain Management: Duragesic patch 50 mcg, oxycodone IR 5 mg every 6 hours as needed pain  5. History of renal transplant 2002. Followup renal services as needed.  Latest creatinine reviewed Continue CellCept, Prograf and prednisone 5 mg daily  6. COPD/status post thoracentesis 06/01/2011. Followup pulmonary services as needed. Continue respiratory therapy/Advair/Atrovent/Proventil. Patient with chronic oxygen therapy. Check oxygen saturations every shift  7. Diabetes mellitus. Latest hemoglobin A1c of 6.7. Lantus insulin 15 units daily. Patient with insulin pump prior to admission.  8. Dysphagia/decreased nutritional storage. Followup speech therapy. Monitor for aspiration. Obtain dietary consult  9. Parkinson's disease. Sinemet 25-100 3 times a day  10. 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 Continue aspirin  11. History of orthostatic hypotension. Metinon 30 mg 3 times a day. Monitor with increased  12. 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 If pt can be re directed, may do better on BI team, or if not tolerating therapy SNF- team conference , increase seroquel to BID  Length of stay, days: 5  [name] ,  06/13/2011, 8:31 AM   06/13/2011, 8:31 AM

## 2011-06-13 NOTE — Progress Notes (Signed)
Patient agitated this am with staff and increased confusion, refusing breakfast and medication. Wife arrived shortly after and calmed patient to allow staff to complete treatments. Patient is total assist and requires two people to assist with ADLs. Bilateral buttocks reddened and skin slit to inner L buttocks and deanuted area. EPBC applied and turned q2 hours. CBG's  increased today with CBG coverage given. Patient has a poor diet and requires max assistance to eat. Will continue to monitor

## 2011-06-14 LAB — GLUCOSE, CAPILLARY
Glucose-Capillary: 152 mg/dL — ABNORMAL HIGH (ref 70–99)
Glucose-Capillary: 252 mg/dL — ABNORMAL HIGH (ref 70–99)

## 2011-06-14 MED ORDER — INSULIN GLARGINE 100 UNIT/ML ~~LOC~~ SOLN
25.0000 [IU] | Freq: Every day | SUBCUTANEOUS | Status: DC
  Administered 2011-06-14: 25 [IU] via SUBCUTANEOUS

## 2011-06-14 NOTE — Progress Notes (Signed)
Recreational Therapy Session Note  Patient Details  Name: John [name] MRN: [id number] Date of Birth: [birth year] Today's Date: 06/14/2011 Order received and chart reviewed.  Pt placed on HOLD for TR services at this time due to limited activity tolerance.  Will continue to monitor. Scotty Weigelt 06/14/2011, 9:09 AM

## 2011-06-14 NOTE — Progress Notes (Signed)
Patient complaining of bladder discomfort and inability to void.  Patient attempted voids multiple times and is unable.  Bladder scan reveals .  A void is charted for 1915, but per NT report, urine may or may not have been mixed with stool.  Last known void was 1500; patient in and out cathed for .  Patient states some relief from bladder discomfort.  Will continue to monitor.

## 2011-06-14 NOTE — Progress Notes (Signed)
Occupational Therapy Session Note  Patient Details  Name: John [name] MRN: [id number] Date of Birth: Feb 14, 1941  Today's Date: 06/14/2011 Time: 1120-1225 Time Calculation (min): 65 min  Short Term Goals: Week 1:  OT Short Term Goal 1 (Week 1): Pt will complete UB bathing with min assist in sitting position. OT Short Term Goal 2 (Week 1): Pt will complete LB bathing with mod assist in sit to stand position. OT Short Term Goal 3 (Week 1): Pt will complete UB dressing with mod assist in seated position. OT Short Term Goal 4 (Week 1): Pt will complete LB dressing with mod assist in sit to stand position. OT Short Term Goal 5 (Week 1): Pt will complete stand pivot transfer with max assist to [facility].  Skilled Therapeutic Interventions/Progress Updates:    Pt seen for ADL retraining with focus on bed mobility and toilet transfer with use of mechanical lift.  Pt incontinent of bowel upon arrival. Focus on bed mobility with increased initiation and use of bed rails to assist with rolling, max assist roll to right and left.  Post cleaning, pt again incontinent of bowel.  Engaged in toilet transfer to [facility] with assist from 3 people one in front, one in back, and one to stabilize BSC with modified stand pivot transfer.  Pt able to initiate sit to stand, however still total assist with transfer.  Use of mechanical lift with sit <> stand from [facility] for hygiene and with transfer to recliner.  Pt more alert this session, but unable to correctly identify location.  Therapy Documentation Precautions:  Precautions Precautions: Back;Fall Precaution Comments: No bending, arching, twisting; on supplemental 02 to maintain sats >92% Restrictions Weight Bearing Restrictions: No Pain: Pain Assessment Pain Assessment: 0-10 Pain Score:   7 Pain Type: Acute pain Pain Location: Back Pain Orientation: Lower Pain Intervention(s): Repositioned Multiple Pain Sites: No  See FIM for current functional  status  Therapy/Group: Individual Therapy  Leonette Monarch 06/14/2011, 12:58 PM

## 2011-06-14 NOTE — Progress Notes (Signed)
Speech Language Pathology Daily Session Note  Patient Details  Name: John [name] MRN: [id number] Date of Birth: Sep 18, 1940  Today's Date: 06/14/2011 Time: 1478-2956 Time Calculation (min): 45 min  Short Term Goals: Week 1: SLP Short Term Goal 1 (Week 1): Pt will demonstrate sustained attention to a functional task for ~10 minutes with Mod A verbal cues for redirection SLP Short Term Goal 2 (Week 1): Pt will demonstrate orientation to situation, time and place with Mod A contextual and semantic cues.  SLP Short Term Goal 3 (Week 1): Pt will demonstrate functional problem solving with familiar tasks with Mod A verbal cues. SLP Short Term Goal 4 (Week 1): Pt will demonstrate safety awareness and will utilize call bell to express wants/needs with Mod A verbal and semantic cues.   Skilled Therapeutic Interventions: Session focused on dysphagia treatment and attention to self feeding; SLP facilitated session with total assist to feed patient Dys.1 textures with increased intake today; patient did manage cup with moderate assist semantic cues to take small single sips and demonstrated cough x1 due to suspected penetration as a result of portion control, patient appeared to clear with moderate strength cough.  Patient able to sustain attention to conversation for 2 minutes with moderate assist semantic cues and evidence of word finding difficulty during conversation with friend.    Daily Session FIM:  Comprehension Comprehension Mode: Auditory Comprehension: 2-Understands basic 25 - 49% of the time/requires cueing 51 - 75% of the time Expression Expression Mode: Verbal Expression: 2-Expresses basic 25 - 49% of the time/requires cueing 50 - 75% of the time. Uses single words/gestures. Social Interaction Social Interaction: 2-Interacts appropriately 25 - 49% of time - Needs frequent redirection. Problem Solving Problem Solving: 1-Solves basic less than 25% of the time - needs direction nearly  all the time or does not effectively solve problems and may need a restraint for safety Memory Memory: 1-Recognizes or recalls less than 25% of the time/requires cueing greater than 75% of the time FIM - Eating Eating Activity: 1: Helper feeds patient General    Pain Pain Assessment Pain Assessment: 0-10 Pain Score:   7 Faces Pain Scale: Hurts even more Pain Type: Acute pain Pain Location: Back Pain Orientation: Lower Pain Intervention(s): RN made aware Multiple Pain Sites: No  Therapy/Group: Individual Therapy  Charlane Ferretti., CCC-SLP [phone]  Tika Hannis 06/14/2011, 9:21 AM

## 2011-06-14 NOTE — Progress Notes (Signed)
Patient ID: John [name], male   DOB: Oct 26, 1940, 71 y.o.   MRN: [id number] John [name] is a 71 y.o. male  Subjective: My bottom hurts more than my back.  Per RN buttocks excoriation improving  Objective: Vital signs in last 24 hours: Temp:  [98.1 F (36.7 C)] 98.1 F (36.7 C) (04/24 0500) Pulse Rate:  [95-101] 95  (04/24 0500) Resp:  [17-19] 19  (04/24 0500) BP: (116-164)/(65-78) 164/78 mmHg (04/24 0500) SpO2:  [96 %-97 %] 97 % (04/24 0500) Weight:  [93.9 kg (207 lb 0.2 oz)] 93.9 kg (207 lb 0.2 oz) (04/24 0715) Weight change:  Last BM Date: 06/13/11  Intake/Output from previous day: 04/23 0701 - 04/24 0700 In: 600 [P.O.:600] Out: 651 [Urine:650; Stool:1] Last cbgs: CBG (last 3)   Basename 06/14/11 0712 06/13/11 2121 06/13/11 1725  GLUCAP 152* 107* 208*     Physical Exam Gen NAD Mood/affect approp Cor:  RRR no Murmur Lungs:  Clear Abd:  +BS soft NT Skin:  Excoriation perianal mild/mod Ext:  No C/C/E Motors: 5/5 in UEs, 4/5 in LEs Mood/affect- no agitation, follows commands  Lab Results: Results for orders placed during the hospital encounter of 06/08/11 (from the past 24 hour(s))  GLUCOSE, CAPILLARY     Status: Abnormal   Collection Time   06/13/11 11:50 AM      Component Value Range   Glucose-Capillary 338 (*) 70 - 99 (mg/dL)  COMPREHENSIVE METABOLIC PANEL     Status: Abnormal   Collection Time   06/13/11 12:55 PM      Component Value Range   Sodium 137  135 - 145 (mEq/L)   Potassium 3.9  3.5 - 5.1 (mEq/L)   Chloride 100  96 - 112 (mEq/L)   CO2 28  19 - 32 (mEq/L)   Glucose, Bld 347 (*) 70 - 99 (mg/dL)   BUN 20  6 - 23 (mg/dL)   Creatinine, Ser 9.14  0.50 - 1.35 (mg/dL)   Calcium 9.6  8.4 - 78.2 (mg/dL)   Total Protein 6.2  6.0 - 8.3 (g/dL)   Albumin 1.9 (*) 3.5 - 5.2 (g/dL)   AST 11  0 - 37 (U/L)   ALT 7  0 - 53 (U/L)   Alkaline Phosphatase 120 (*) 39 - 117 (U/L)   Total Bilirubin 0.7  0.3 - 1.2 (mg/dL)   GFR calc non Af Amer 73 (*) >90  (mL/min)   GFR calc Af Amer 84 (*) >90 (mL/min)  CBC     Status: Abnormal   Collection Time   06/13/11 12:55 PM      Component Value Range   WBC 8.9  4.0 - 10.5 (K/uL)   RBC 4.52  4.22 - 5.81 (MIL/uL)   Hemoglobin 10.2 (*) 13.0 - 17.0 (g/dL)   HCT 95.6 (*) 21.3 - 52.0 (%)   MCV 71.5 (*) 78.0 - 100.0 (fL)   MCH 22.6 (*) 26.0 - 34.0 (pg)   MCHC 31.6  30.0 - 36.0 (g/dL)   RDW 08.6 (*) 57.8 - 15.5 (%)   Platelets 177  150 - 400 (K/uL)  DIFFERENTIAL     Status: Normal   Collection Time   06/13/11 12:55 PM      Component Value Range   Neutrophils Relative 77  43 - 77 (%)   Lymphocytes Relative 15  12 - 46 (%)   Monocytes Relative 6  3 - 12 (%)   Eosinophils Relative 2  0 - 5 (%)   Basophils Relative  0  0 - 1 (%)   Neutro Abs 6.9  1.7 - 7.7 (K/uL)   Lymphs Abs 1.3  0.7 - 4.0 (K/uL)   Monocytes Absolute 0.5  0.1 - 1.0 (K/uL)   Eosinophils Absolute 0.2  0.0 - 0.7 (K/uL)   Basophils Absolute 0.0  0.0 - 0.1 (K/uL)   RBC Morphology ELLIPTOCYTES    GLUCOSE, CAPILLARY     Status: Abnormal   Collection Time   06/13/11  5:25 PM      Component Value Range   Glucose-Capillary 208 (*) 70 - 99 (mg/dL)   Comment 1 Notify RN    GLUCOSE, CAPILLARY     Status: Abnormal   Collection Time   06/13/11  9:21 PM      Component Value Range   Glucose-Capillary 107 (*) 70 - 99 (mg/dL)   Comment 1 Notify RN    GLUCOSE, CAPILLARY     Status: Abnormal   Collection Time   06/14/11  7:12 AM      Component Value Range   Glucose-Capillary 152 (*) 70 - 99 (mg/dL)   Comment 1 Notify RN       Studies/Results: No results found.  Medications: I have reviewed the patient's current medications.  Assessment: 1. Functional deficits secondary to Lumbar radiculopathy s/p L5-S1 decompression with muliple post op complications which require15 hrs per week of interdisciplinary therapy in a comprehensive inpatient rehab setting.  Physiatrist is providing close team supervision and 24 hour management of active medical  problems listed below.  Physiatrist and rehab team continue to assess barriers to discharge/monitor patient progress toward functional and medical goals. Team conf today  Plan: 1. lumbar HNP with radiculopathy. Status post laminotomies and facetotomies with postoperative respiratory failure and anoxia  2. Mood/depression/anxiety. Continue Seroquel and Lamictal and vilazodone. Check sleep chart. Discuss cognitive baseline with family.He is not tolerating movement and has fear of movement, pain meds are likely to confuse him further 3. DVT Prophylaxis/Anticoagulation: SCDs. Monitor for deep vein thrombosis  4. Pain Management: Duragesic patch 50 mcg, oxycodone IR 5 mg every 6 hours as needed pain  5. History of renal transplant 2002. Followup renal services as needed.  Latest creatinine reviewed Continue CellCept, Prograf and prednisone 5 mg daily  6. COPD/status post thoracentesis 06/01/2011. Followup pulmonary services as needed. Continue respiratory therapy/Advair/Atrovent/Proventil. Patient with chronic oxygen therapy. Check oxygen saturations every shift  7. Diabetes mellitus. Latest hemoglobin A1c of 6.7. Lantus insulin 15 units daily. Patient with insulin pump prior to admission.  8. Dysphagia/decreased nutritional storage. Followup speech therapy. Monitor for aspiration. Obtain dietary consult  9. Parkinson's disease. Sinemet 25-100 3 times a day  10. 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 Continue aspirin    11. 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 If pt can be re directed, may do better on BI team, or if not tolerating therapy SNF- team conference , increase seroquel to BID, improved today 12.  Urinary retention due to immobility and poss diabetic cystopathy Foley- D/C today, PVRs cont flomax  Length of stay, days: 6  Tayten Bergdoll E ,  06/14/2011, 8:36 AM   06/14/2011, 8:36 AM

## 2011-06-14 NOTE — Progress Notes (Signed)
Physical Therapy Session Note  Patient Details  Name: John [name] MRN: [id number] Date of Birth: [birth year]  Today's Date: 06/14/2011 Time: 1400-1502 Time Calculation (min): 62 min  Short Term Goals: Week 1:  PT Short Term Goal 1 (Week 1): Patient will perform bed mobility with mod A while recalling 2/3 back precautions PT Short Term Goal 2 (Week 1): Patient will tolerate sitting EOB x 20-30 minutes with improved trunk posture for functional activities PT Short Term Goal 3 (Week 1): Patient will transfer bed <> w/c with mod A with attention to back precautions PT Short Term Goal 4 (Week 1): Patient will perform w/c mobility x 150 on unit with mod A for endurance training PT Short Term Goal 5 (Week 1): Patient will tolerate 5 minutes of dynamic standing balance, endurance and pre-gait training  Therapy Documentation Precautions:  Precautions Precautions: Back;Fall Precaution Comments: No bending, arching, twisting; on supplemental 02 to maintain sats >92% Restrictions Weight Bearing Restrictions: No Vital Signs: Therapy Vitals Temp: 98 F (36.7 C) Temp src: Oral Pulse Rate: 80  Resp: 19  BP: 115/54 mmHg Patient Position, if appropriate: Lying Oxygen Therapy SpO2: 99 % Pain: Pain Assessment Pain Assessment: Faces Faces Pain Scale: Hurts whole lot Pain Type: Acute pain Pain Location: Buttocks Pain Orientation: Lower Pain Descriptors: Burning Pain Onset: Other (Comment) (during hygiene and with prolonged pressure) Pain Intervention(s): Repositioned Other Treatments: Treatments Therapeutic Activity: Patient c/o buttocks pain after prolonged sitting in the recliner; set patient up in SARA plus; performed sit <> stand and static standing x 10-15 seconds at a time for pressure relief, WB through bilat LE and postural control training; each time patient unable to hold LE in extension or hold head or trunk erect even with total A; transfer to bed with +3 total A and SARA plus  and placed in to supine.  Patient noted to have incontinent bowel episode assisted tech and RN with rolling patient L and R with total A for hygiene and pressure relief.    See FIM for current functional status  Therapy/Group: Individual Therapy  Edman Circle [facility] 06/14/2011, 5:02 PM

## 2011-06-14 NOTE — Care Management Note (Signed)
Called pt's wife to report on team conference. Wife pleased to hear pt beginning to show improvements and in agreement with min A goals and target d/c date of 06/28/11.

## 2011-06-14 NOTE — Patient Care Conference (Signed)
Inpatient RehabilitationTeam Conference Note Date: 06/14/2011   Time: 11:10 AM    Patient Name: John [name]      Medical Record Number: [id number]  Date of Birth: October 14, 1940 Sex: Male         Room/Bed: 4035/4035-01 Payor Info: Payor: MEDICARE  Plan: MEDICARE PART A AND B  Product Type: *No Product type*     Admitting Diagnosis: VDRF, lumbar lami,renal transplant  Admit Date/Time:  06/08/2011  2:34 PM Admission Comments: No comment available   Primary Diagnosis:  Physical deconditioning Principal Problem: Physical deconditioning  Patient Active Problem List  Diagnoses Date Noted  . Physical deconditioning 06/08/2011  . Delirium due to general medical condition 05/30/2011  . 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 Acute respiratory failure 05/22/2011  . Pulmonary edema 05/22/2011  . Diastolic CHF, acute on chronic 05/19/2011  . DM (diabetes mellitus), type 2 05/19/2011  . Insulin pump in place 05/19/2011  . Spondylosis 05/16/2011  . Herniated nucleus pulposus 05/16/2011  . Back pain 04/23/2011  . First degree AV block 12/01/2010  . Dyspnea 08/29/2010  . COPD (chronic obstructive pulmonary disease) 08/18/2010  . Chronic kidney disease   . Carotid stenosis 08/11/2010  . Nonspecific abnormal unspecified cardiovascular function study 07/04/2010  . LEG PAIN, RIGHT 01/25/2010  . PLEURAL EFFUSION, RIGHT 01/06/2010  . INTERMITTENT CLAUDICATION, BILATERAL 12/27/2009  . PURE HYPERCHOLESTEROLEMIA 11/16/2009  . CORONARY ATHEROSLERO UNSPEC TYPE BYPASS GRAFT 11/16/2009  . PVD WITH CLAUDICATION 11/01/2009  . HYPERLIPIDEMIA-MIXED 06/24/2009  . Atrial fibrillation 05/26/2009  . Chronic diastolic heart failure 05/26/2009  . Orthostatic hypotension 05/26/2009  . Coronary atherosclerosis of native coronary artery 05/20/2009  . PERICARDIAL EFFUSION 05/20/2009    Expected Discharge Date: Expected Discharge Date: 06/28/11  Team Members Present: Physician: Dr. Claudette Laws Case Manager Present: Lutricia Horsfall, RN Social Worker Present: Dossie Der, LCSW Nurse Present: Rosalio Macadamia, RN PT Present: Edman Circle, PT;Caroline Adriana Simas, PT OT Present: Bretta Bang, Verlene Mayer, OT SLP Present: Fae Pippin, SLP Perrin Maltese, OT Tora Duck, RN, PPS Coordinator    Current Status/Progress Goal Weekly Team Focus  Medical   Agitation from hypoxic encephalopathy, pain from skin breakdown worse than back pain  Manage agitation, control pain without worsening mental status  Medication adjustment   Bowel/Bladder   Foley catheter maintanence, Incont bowel in breif   manage bowel/bladder with min asst  timed toileting to prevent incont episodes   Swallow/Nutrition/ Hydration   Dys.3/Dys.2 depending on level of alertness and thin lquids via cup with full spuervision for small sips  least restrictive p.o.  increase initiation/willingness to participate   ADL's   total A +2 with bathing, dressing, bed mobility, toileting hygiene  supervision UB, min A LB and transfers  initiation and attention to task, transfers, getting pt OOB   Mobility   +2 total A for transfers, sitting balance, no standing yet  min A overall  OOB activity tolerance, tranfers, sitting balance, patient initiation   Communication   moderate assist  minimal assist  increase use of external aids   Safety/Cognition/ Behavioral Observations  max-total assist   minimal assist   increase awareness    Pain   no compaint of pain         Skin   red buttocks and groin area, deanuted slit to L inner buttocks, penis meatus area reddened  No evidence of skin breakdown and healed slit to buttocks  continue Q2hour turns and EPBC to bottom      *See Interdisciplinary  Assessment and Plan and progress notes for long and short-term goals  Barriers to Discharge: Poor mobility, wife cannot handle at the current time, intermittent agitation and disorientation, pain    Possible Resolutions to  Barriers:  See above    Discharge Planning/Teaching Needs:  Home with wife but unsure if wife can provide the amount of care pt will require at discharge      Team Discussion:  Discussion of pt's dx, hx. Pt with poor progress due to extreme lethargy, confusion. Adjusted meds. Started on abx for UTI and starting to show signs of progress today.   Revisions to Treatment Plan:     Continued Need for Acute Rehabilitation Level of Care: The patient requires daily medical management by a physician with specialized training in physical medicine and rehabilitation for the following conditions: Daily direction of a multidisciplinary physical rehabilitation program to ensure safe treatment while eliciting the highest outcome that is of practical value to the patient.: Yes Daily medical management of patient stability for increased activity during participation in an intensive rehabilitation regime.: Yes Daily analysis of laboratory values and/or radiology reports with any subsequent need for medication adjustment of medical intervention for : Post surgical problems;Neurological problems;Other  Meryl Dare 06/15/2011, 3:24 PM

## 2011-06-15 LAB — GLUCOSE, CAPILLARY: Glucose-Capillary: 175 mg/dL — ABNORMAL HIGH (ref 70–99)

## 2011-06-15 MED ORDER — INSULIN GLARGINE 100 UNIT/ML ~~LOC~~ SOLN
30.0000 [IU] | Freq: Every day | SUBCUTANEOUS | Status: DC
  Administered 2011-06-16: 30 [IU] via SUBCUTANEOUS
  Administered 2011-06-17: 10:00:00 via SUBCUTANEOUS

## 2011-06-15 MED ORDER — DIPHENOXYLATE-ATROPINE 2.5-0.025 MG PO TABS: 1.0000 | ORAL_TABLET | Freq: Four times a day (QID) | ORAL | Status: DC | PRN

## 2011-06-15 MED ORDER — SACCHAROMYCES BOULARDII 250 MG PO CAPS
500.0000 mg | ORAL_CAPSULE | Freq: Two times a day (BID) | ORAL | Status: DC
  Administered 2011-06-15 – 2011-06-28 (×26): 500 mg via ORAL
  Filled 2011-06-15 (×29): qty 2

## 2011-06-15 NOTE — Progress Notes (Signed)
Inpatient Diabetes Program Recommendations  AACE/ADA: New Consensus Statement on Inpatient Glycemic Control (2009)  Target Ranges:  Prepandial:   less than 140 mg/dL      Peak postprandial:   less than 180 mg/dL (1-2 hours)      Critically ill patients:  140 - 180 mg/dL   Noted increase in basal insulin today.  Elevated post-prandial CBGs indicate need for meal coverage insulin.   Inpatient Diabetes Program Recommendations Insulin - Basal: . Correction (SSI): . Insulin - Meal Coverage: Add Novolog 4 units meal coverage  Note: If adding meal coverage Novolog, may want to decrease correction scale to moderate.    Thank you  Piedad Climes [facility] Inpatient Diabetes Coordinator [phone]

## 2011-06-15 NOTE — Progress Notes (Signed)
Occupational Therapy Session Note  Patient Details  Name: John [name] MRN: [id number] Date of Birth: 10/02/1940  Today's Date: 06/15/2011 Time: 1100-1200 Time Calculation (min): 60 min  Short Term Goals: Week 1:  OT Short Term Goal 1 (Week 1): Pt will complete UB bathing with min assist in sitting position. OT Short Term Goal 2 (Week 1): Pt will complete LB bathing with mod assist in sit to stand position. OT Short Term Goal 3 (Week 1): Pt will complete UB dressing with mod assist in seated position. OT Short Term Goal 4 (Week 1): Pt will complete LB dressing with mod assist in sit to stand position. OT Short Term Goal 5 (Week 1): Pt will complete stand pivot transfer with max assist to [facility].  Skilled Therapeutic Interventions/Progress Updates:    Attempted ADL retraining at bed level.  Pt incontinent of bowel.  Pt rolled side to side with mod A for therapist to clean and change brief.  Pt incontinent of bowel while hygiene performed.  Pt sat EOB with max assist to don pullover shirt with mod A and min A for static sitting balance.  While transferring with Huntley Dec Plus to recliner pt stated that he had another bowel movement.  Pt placed on BSC.  Pt stood for approx 1 min with Huntley Dec Plus to for helpers to clean and pull up pants before sitting in recliner.  Therapy Documentation Precautions:  Precautions Precautions: Back;Fall Precaution Comments: No bending, arching, twisting; on supplemental 02 to maintain sats >92% Restrictions Weight Bearing Restrictions: No   Pain: Pain Assessment Pain Assessment: 0-10 Pain Score:   7 Pain Type: Acute pain Pain Location: Buttocks Pain Orientation: Lower Pain Descriptors: Aching;Sore Pain Onset: On-going Patients Stated Pain Goal: 2 Pain Intervention(s): RN made aware  See FIM for current functional status  Therapy/Group: Individual Therapy  Rich Brave 06/15/2011, 12:15 PM

## 2011-06-15 NOTE — Plan of Care (Signed)
Problem: RH BLADDER ELIMINATION Goal: RH STG MANAGE BLADDER WITH EQUIPMENT WITH ASSISTANCE STG Manage Bladder With Equipment With minimal Assistance  Outcome: Completed/Met Date Met:  06/15/11 Foley discontinued 06/14/11

## 2011-06-15 NOTE — Progress Notes (Signed)
Physical Therapy Weekly Progress Note  Patient Details  Name: John [name] MRN: [id number] Date of Birth: 1940-04-18  Today's Date: 06/15/2011 Time: 1407-1500 Time Calculation (min): 53 min  Patient has made very slow progress toward PT LTG and has met 0 of 5 short term goals.  Upon evaluation patient was total A +2 for all bed mobility and to sit EOB for 1-2 minutes secondary to significant pain, fatigue, weakness and altered mental status.  Patient's therapy schedule was decreased to 15hrs/7 days to increased activity tolerance.  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  Patient with less c/o pain but still requires +2A for basic bed mobility, sit <> supine in order to adhere to back precautions and use of Beasy board or lift equipment for bed <> recliner transfers and for static standing.  Patient has been able to tolerate 30 min-1 hour OOB in recliner but must be returned to bed and placed in sidelying for pressure relief secondary to wound on buttocks.  Will begin to transfer patient to tilt in space or reclining w/c with pressure relief cushion to gradually increase patient's time upright with adequate pressure relief.    Patient continues to demonstrate the following deficits: impaired memory, attention, awareness, initiation, significantly decreased activity tolerance and endurance, pain in low back and buttocks, impaired trunk and postural control, bilat UE and LE weakness and decreased ROM, impaired sitting and standing balance and therefore will continue to benefit from skilled PT intervention to enhance overall performance with activity tolerance, balance, postural control, functional use of  all extremeties, attention, awareness and knowledge of precautions.  Patient not progressing toward long term goals.  See goal revision..  Plan of care revisions: Goals downgraded to  min A overall with prolonged LOS; if wife unable to provide physical assistance SNF placement may be considered.  PT Short Term Goals Week 1:  PT Short Term Goal 1 (Week 1): Patient will perform bed mobility with mod A while recalling 2/3 back precautions PT Short Term Goal 1 - Progress (Week 1): Revised due to lack of progress PT Short Term Goal 2 (Week 1): Patient will tolerate sitting EOB x 20-30 minutes with improved trunk posture for functional activities PT Short Term Goal 2 - Progress (Week 1): Revised due to lack of progress PT Short Term Goal 3 (Week 1): Patient will transfer bed <> w/c with mod A with attention to back precautions PT Short Term Goal 3 - Progress (Week 1): Revised due to lack of progress PT Short Term Goal 4 (Week 1): Patient will perform w/c mobility x 150 on unit with mod A for endurance training PT Short Term Goal 4 - Progress (Week 1): Revised due to lack of progress PT Short Term Goal 5 (Week 1): Patient will tolerate 5 minutes of dynamic standing balance, endurance and pre-gait training PT Short Term Goal 5 - Progress (Week 1): Revised due to lack of progress Week 2:  PT Short Term Goal 1 (Week 2): Patient will perform rolling in bed and sup <> sit with max A and 50-75% verbal cues for attention to task and sequencing PT Short Term Goal 2 (Week 2): Patient will tolerate 5-10 minutes unsupported sitting EOB and 15-20 minutes supported sitting in chair to engage in trunk control, UE and LE strengthening activities PT Short Term Goal 3 (Week 2): Patient will tolerate 5-10 minutes total A supported standing (lift equipment)  for pressure relief, WB through bilat LE and trunk control training  PT Short Term Goal 4 (Week 2): Patient will assist 25% with bed <> w/c transfers without use of lift equipment  Therapy Documentation Precautions:  Precautions Precautions: Back;Fall Precaution Comments: No bending, arching, twisting; on supplemental 02 to maintain sats  >92% Restrictions Weight Bearing Restrictions: No Pain: Pain Assessment Pain Assessment: No/denies pain Other Treatments: Treatments Therapeutic Activity: Changed to tilt in space w/c with Vonna Kotyk 2 gel cushion for OOB time and activities to improve pressure relief and upright tolerance. Patient agreeable to sit EOB; patient able to assist with advancing LE to EOB and bringing trunk upright with mod-max A today; able to sit more upright EOB without back support and with min-mod A to don SARA sling.  Transferred from bed > tilt in space w/c in standing on SARA plus (did not attempt to have patient pivot feet) with +2 A.  Patient positioned in tilted position with ELR for edema management and head support for pressure relief while maintaining upright position in w/c.  Patient able to tolerate 1 hour up in w/c.   See FIM for current functional status  Therapy/Group: Individual Therapy  Edman Circle [facility] 06/15/2011, 5:02 PM

## 2011-06-15 NOTE — Progress Notes (Signed)
Speech Language Pathology Daily Session Note  Patient Details  Name: John [name] MRN: [id number] Date of Birth: 1941-02-12  Today's Date: 06/15/2011 Time: 1191-4782 Time Calculation (min): 45 min  Short Term Goals: Week 1: SLP Short Term Goal 1 (Week 1): Pt will demonstrate sustained attention to a functional task for ~10 minutes with Mod A verbal cues for redirection SLP Short Term Goal 2 (Week 1): Pt will demonstrate orientation to situation, time and place with Mod A contextual and semantic cues.  SLP Short Term Goal 3 (Week 1): Pt will demonstrate functional problem solving with familiar tasks with Mod A verbal cues. SLP Short Term Goal 4 (Week 1): Pt will demonstrate safety awareness and will utilize call bell to express wants/needs with Mod A verbal and semantic cues.   Skilled Therapeutic Interventions: Session focused on skilled treatment of dysphagia and cognition; SLP facilitated session with set up and limiting field with self feeding by handing patient one item at a time, as well as minimal assist semantic cues to consume small sips and check for pocketing with breakfast.  Patient with cough 2-3 times throughout session, typically one first sip, SLP suspects due to initial premature loss of bolus.  Patient initiated use of call bell to call wife and requested  Help to get an outside line; SLP provided him with phone and instructed him to dial 9 to get out patient recalled home phone number and recognized difficulty dialing the number and requested help from SLP to dial number due to his shaky hands.  Patient appeared to appropriately converse with wife on phone.  Overall, functional improvement compare to yesterday and recommend diet upgrade to Dys.3 textures due to improvements in cognition.    Daily Session Precautions/Restrictions    FIM:  Comprehension Comprehension Mode: Auditory Comprehension: 3-Understands basic 50 - 74% of the time/requires cueing 25 - 50%  of the  time Expression Expression Mode: Verbal Expression: 3-Expresses basic 50 - 74% of the time/requires cueing 25 - 50% of the time. Needs to repeat parts of sentences. Social Interaction Social Interaction: 4-Interacts appropriately 75 - 89% of the time - Needs redirection for appropriate language or to initiate interaction. Problem Solving Problem Solving: 3-Solves basic 50 - 74% of the time/requires cueing 25 - 49% of the time Memory Memory: 2-Recognizes or recalls 25 - 49% of the time/requires cueing 51 - 75% of the time FIM - Eating Eating Activity: 4: Helper checks for pocketed food General    Pain Pain Assessment Pain Assessment: 0-10 Pain Score:   7 Pain Type: Acute pain Pain Location: Buttocks Pain Orientation: Lower Pain Descriptors: Aching;Sore Pain Onset: On-going Pain Intervention(s): RN made aware;Repositioned  Therapy/Group: Individual Therapy  Charlane Ferretti., CCC-SLP [phone]  [name] 06/15/2011, 10:12 AM

## 2011-06-15 NOTE — Progress Notes (Signed)
Patient had multiple incontinent loose/liquid dark brown stools throughout the night.  Deatra Ina, PA notified of frequent stools; no new orders received.  Will continue to monitor.

## 2011-06-15 NOTE — Progress Notes (Signed)
Per State Regulation 482.30 This chart was reviewed for medical necessity with respect to the patient's Admission/Duration of stay. Pt on Cipro for UTI. Having loose stools. Checking for c-diff. Improvements in participation today.  Meryl Dare                 Nurse Care Manager            Next Review Date: 06/19/11

## 2011-06-15 NOTE — Progress Notes (Signed)
Patient ID: John [name], male   DOB: 1940/02/24, 71 y.o.   MRN: [id number] John [name] is a 71 y.o. male  Subjective: "Problem with my bladder" " I don't know if I want to go through this therapy" Several liquid stools yesterday.  Has prn miralax, required I/O cath yest Objective: Vital signs in last 24 hours: Temp:  [98 F (36.7 C)-98.3 F (36.8 C)] 98.3 F (36.8 C) (04/25 0534) Pulse Rate:  [80-88] 88  (04/25 0534) Resp:  [18-19] 18  (04/25 0534) BP: (115-158)/(54-74) 158/74 mmHg (04/25 0534) SpO2:  [96 %-99 %] 99 % (04/25 0534) Weight change:  Last BM Date: 06/14/11  Intake/Output from previous day: 04/24 0701 - 04/25 0700 In: 420 [P.O.:420] Out: 850 [Urine:850] Last cbgs: CBG (last 3)   Basename 06/15/11 0729 06/14/11 2059 06/14/11 1608  GLUCAP 175* 158* 252*     Physical Exam Gen NAD Mood/affect approp Cor:  RRR no Murmur Lungs:  Clear Abd:  +BS soft NT Skin:  Excoriation perianal mild/mod Ext:  No C/C/[name]: 5/5 in UEs, 4/5 in LEs Mood/affect- no agitation, follows commands  Lab Results: Results for orders placed during the hospital encounter of 06/08/11 (from the past 24 hour(s))  GLUCOSE, CAPILLARY     Status: Abnormal   Collection Time   06/14/11 12:27 PM      Component Value Range   Glucose-Capillary 313 (*) 70 - 99 (mg/dL)   Comment 1 Notify RN    GLUCOSE, CAPILLARY     Status: Abnormal   Collection Time   06/14/11  4:08 PM      Component Value Range   Glucose-Capillary 252 (*) 70 - 99 (mg/dL)   Comment 1 Notify RN    GLUCOSE, CAPILLARY     Status: Abnormal   Collection Time   06/14/11  8:59 PM      Component Value Range   Glucose-Capillary 158 (*) 70 - 99 (mg/dL)  GLUCOSE, CAPILLARY     Status: Abnormal   Collection Time   06/15/11  7:29 AM      Component Value Range   Glucose-Capillary 175 (*) 70 - 99 (mg/dL)   Comment 1 Notify RN       Studies/Results: No results found.  Medications: I have reviewed the patient's current  medications.  Assessment: 1. Functional deficits secondary to Lumbar radiculopathy s/p L5-S1 decompression with muliple post op complications which require15 hrs per week of interdisciplinary therapy in a comprehensive inpatient rehab setting.  Physiatrist is providing close team supervision and 24 hour management of active medical problems listed below.  Physiatrist and rehab team continue to assess barriers to discharge/monitor patient progress toward functional and medical goals. Team conf today  Plan: 1. lumbar HNP with radiculopathy. Status post laminotomies and facetotomies with postoperative respiratory failure and anoxia  2. Mood/depression/anxiety. Continue Seroquel and Lamictal and vilazodone. Check sleep chart. Discuss cognitive baseline with family.He is not tolerating movement and has fear of movement, pain meds are likely to confuse him further 3. DVT Prophylaxis/Anticoagulation: SCDs. Monitor for deep vein thrombosis  4. Pain Management: Duragesic patch 50 mcg, oxycodone IR 5 mg every 6 hours as needed pain  5. History of renal transplant 2002. Followup renal services as needed.  Latest creatinine reviewed Continue CellCept, Prograf and prednisone 5 mg daily  6. COPD/status post thoracentesis 06/01/2011. Followup pulmonary services as needed. Continue respiratory therapy/Advair/Atrovent/Proventil. Patient with chronic oxygen therapy. Check oxygen saturations every shift  7. Diabetes mellitus. Latest hemoglobin A1c of 6.7.  Lantus insulin 15 units daily.will increase Patient with insulin pump prior to admission.  8. Dysphagia/decreased nutritional storage. Followup speech therapy. Monitor for aspiration. Obtain dietary consult  9. Parkinson's disease. Sinemet 25-100 3 times a day  10. 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 Continue aspirin    11. 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 If pt can be re directed,overall now participating.  I believe  pt is more aware of deficits and encouraged him to con't PT/OT/SLP.  He agreed but will also talk to wife regarding NHP. 12.  Urinary retention due to immobility and poss diabetic cystopathy Foley- D/C d, PVRs cont flomax, I/O cath  Length of stay, days: 7  John [name] ,  06/15/2011, 8:21 AM   06/15/2011, 8:21 AM

## 2011-06-15 NOTE — Progress Notes (Signed)
Pt unable to void in 8 hours, straight cath @1400  for 350cc, pt attempted to use urinal multiple times

## 2011-06-16 ENCOUNTER — Telehealth: Payer: Self-pay | Admitting: Critical Care Medicine

## 2011-06-16 LAB — GLUCOSE, CAPILLARY
Glucose-Capillary: 250 mg/dL — ABNORMAL HIGH (ref 70–99)
Glucose-Capillary: 122 mg/dL — ABNORMAL HIGH (ref 70–99)
Glucose-Capillary: 153 mg/dL — ABNORMAL HIGH (ref 70–99)

## 2011-06-16 MED ORDER — PANTOPRAZOLE SODIUM 40 MG PO TBEC
40.0000 mg | DELAYED_RELEASE_TABLET | Freq: Every day | ORAL | Status: DC
  Administered 2011-06-17 – 2011-06-28 (×12): 40 mg via ORAL
  Filled 2011-06-16 (×12): qty 1

## 2011-06-16 MED ORDER — DIPHENOXYLATE-ATROPINE 2.5-0.025 MG PO TABS
1.0000 | ORAL_TABLET | Freq: Four times a day (QID) | ORAL | Status: DC | PRN
  Administered 2011-06-16: 1 via ORAL
  Filled 2011-06-16: qty 1

## 2011-06-16 MED ORDER — METRONIDAZOLE 500 MG PO TABS: 500.0000 mg | ORAL_TABLET | Freq: Three times a day (TID) | ORAL | Status: DC

## 2011-06-16 NOTE — Telephone Encounter (Signed)
Form given to Jess.  Will forward msg to her.

## 2011-06-16 NOTE — Progress Notes (Signed)
Occupational Therapy Weekly Progress Note and Session Note  Patient Details  Name: John [name] MRN: [id number] Date of Birth: 1940/07/25  Today's Date: 06/16/2011 Time: 1100-1200 Time Calculation (min): 60 min  Patient has met 1 of 5 short term goals.  Pt's progress is very slow and fluctuates based on pain level and medication.  Pt's progress is being complicated by his multiple medical issues that interfere with his progress.  Pt has progressed with ability to attend to tasks for approx 30 seconds and attend to UB dressing.  Pt unable to tolerate sitting upright due to pain in buttocks, attempting tilt in space w/c to increase time OOB while decreasing pressure on buttocks.  At this time pt continues to require mod-max verbal cues for initiation, sequencing, attention, and max-total assist with all mobility.  Currently using SARA plus for all transfers.  Patient continues to demonstrate the following deficits: BLE weakness, tremors with movements, pain inhibiting movement, increased assist with all movements, increased assist with bathing and dressing, decreased initiation, decreased attention and therefore will continue to benefit from skilled OT intervention to enhance overall performance with BADL, iADL and Reduce care partner burden.  Patient progressing toward long term goals..  Continue plan of care.  OT Short Term Goals Week 1:  OT Short Term Goal 1 (Week 1): Pt will complete UB bathing with min assist in sitting position. OT Short Term Goal 1 - Progress (Week 1): Progressing toward goal OT Short Term Goal 2 (Week 1): Pt will complete LB bathing with mod assist in sit to stand position. OT Short Term Goal 2 - Progress (Week 1): Progressing toward goal OT Short Term Goal 3 (Week 1): Pt will complete UB dressing with mod assist in seated position. OT Short Term Goal 3 - Progress (Week 1): Met OT Short Term Goal 4 (Week 1): Pt will complete LB dressing with mod assist in sit to stand  position. OT Short Term Goal 4 - Progress (Week 1): Progressing toward goal OT Short Term Goal 5 (Week 1): Pt will complete stand pivot transfer with max assist to [facility]. OT Short Term Goal 5 - Progress (Week 1): Progressing toward goal Week 2:  OT Short Term Goal 1 (Week 2): Pt will complete UB bathing with min assist in sitting position OT Short Term Goal 2 (Week 2): Pt will complete LB bathing with mod assist OT Short Term Goal 3 (Week 2): Pt will complete LB dressing with mod assist OT Short Term Goal 4 (Week 2): Pt will complete stand pivot transfer to [facility] with max assist  Skilled Therapeutic Interventions/Progress Updates:    Pt seen for ADL retraining supine in bed and sitting EOB.  Focus on bed mobility, rolling, sitting balance, and sit to stand.  Pt demonstrated improved ability to roll to Rt and Lt with assist to obtain bed rails, pt requiring mod assist to roll this session.  Pt unable to sit unsupported this session, requiring max physical cues/support to maintain a seated position.  Pt with 3 bouts of loose bowels during the session requiring continuous cleaning.  Pt able to don shirt sitting supported at EOB with mod assist.  Pt required total +3 assist sit to stand for approx 15 seconds to pull up pants.    Therapy Documentation Precautions:  Precautions Precautions: Back;Fall Precaution Comments: No bending, arching, twisting; on supplemental 02 to maintain sats >92% Restrictions Weight Bearing Restrictions: No Pain:   Pt with no c/o pain when supine in bed.  With movement pt c/o pain in lower back and RLE. ADL: ADL Grooming: Maximal assistance Where Assessed-Grooming: Bed level Upper Body Bathing: Maximal assistance Where Assessed-Upper Body Bathing: Bed level Lower Body Bathing: Dependent Where Assessed-Lower Body Bathing: Bed level Upper Body Dressing: Moderate assistance Where Assessed-Upper Body Dressing: Edge of bed Lower Body Dressing: Dependent Where  Assessed-Lower Body Dressing: Edge of bed Toileting: Dependent Toilet Transfer: Dependent (SARA plus) Toilet Transfer Method: Other (comment) (SARA plus) Toilet Transfer Equipment: Other (comment) (SARA plus) Tub/Shower Transfer: Unable to assess Film/video editor: Unable to assess ADL Comments: Pt required mod assist rolling to Lt to wash buttocks.  Pt very deconditioned and in extreme pain during evaluation.  See FIM for current functional status  Therapy/Group: Individual Therapy  Leonette Monarch 06/16/2011, 3:29 PM

## 2011-06-16 NOTE — Progress Notes (Signed)
Pt keep having multiple loose stool. Pt seem to be confuse at time, stating he is constipated, nurse try to re-orient pt that he is not constipated but instead his having multiple diarrhea. Stool time one was collected earlier and sent to lab for testing to rule out c-diff. Will continue to monitor.

## 2011-06-16 NOTE — Telephone Encounter (Signed)
Per TD, pt's wife signed the form as POA.  [name]

## 2011-06-16 NOTE — Telephone Encounter (Signed)
Pt's spouse called back to add the fax # for [employer]: [phone]. John [name]

## 2011-06-16 NOTE — Progress Notes (Signed)
Physical Therapy Session Note  Patient Details  Name: John [name] MRN: [id number] Date of Birth: 1941-01-06  Today's Date: 06/16/2011 Time: 9629-5284 Time Calculation (min): 71 min  Short Term Goals: Week 1:  PT Short Term Goal 1 (Week 1): Patient will perform bed mobility with mod A while recalling 2/3 back precautions PT Short Term Goal 1 - Progress (Week 1): Revised due to lack of progress PT Short Term Goal 2 (Week 1): Patient will tolerate sitting EOB x 20-30 minutes with improved trunk posture for functional activities PT Short Term Goal 2 - Progress (Week 1): Revised due to lack of progress PT Short Term Goal 3 (Week 1): Patient will transfer bed <> w/c with mod A with attention to back precautions PT Short Term Goal 3 - Progress (Week 1): Revised due to lack of progress PT Short Term Goal 4 (Week 1): Patient will perform w/c mobility x 150 on unit with mod A for endurance training PT Short Term Goal 4 - Progress (Week 1): Revised due to lack of progress PT Short Term Goal 5 (Week 1): Patient will tolerate 5 minutes of dynamic standing balance, endurance and pre-gait training PT Short Term Goal 5 - Progress (Week 1): Revised due to lack of progress Week 2:  PT Short Term Goal 1 (Week 2): Patient will perform rolling in bed and sup <> sit with max A and 50-75% verbal cues for attention to task and sequencing PT Short Term Goal 2 (Week 2): Patient will tolerate 5-10 minutes unsupported sitting EOB and 15-20 minutes supported sitting in chair to engage in trunk control, UE and LE strengthening activities PT Short Term Goal 3 (Week 2): Patient will tolerate 5-10 minutes total A supported standing (lift equipment) for pressure relief, WB through bilat LE and trunk control training  PT Short Term Goal 4 (Week 2): Patient will assist 25% with bed <> w/c transfers without use of lift equipment  Therapy Documentation Precautions:  Precautions Precautions: Back;Fall Precaution Comments:  No bending, arching, twisting; on supplemental 02 to maintain sats >92% Restrictions Weight Bearing Restrictions: No Pain: Pain Assessment Pain Assessment: No/denies pain    Other Treatments:  Patient still having multiple incontinent loose BM; assisted tech with rolling patient in bed for hygiene and skin care; patient was able to flex bilat LE to prepare for rolling and able to reach across midline for rail with max A.  Assisted patient to EOB and maintained sitting EOB to don SARA sling with mod-max A; transferred to tilt in space w/c with total +2A; focused on selective attention while sitting in upright position for head and trunk control training in w/c attempted functional memory and problem solving activity and AROM of bilat LE for ROM and muscle activation.  Patient noted to have very loose BM.  Transferred back to the bed and supine with SARA and +2A for hygiene and skin care.  See FIM for current functional status  Therapy/Group: Individual Therapy  Edman Circle [facility] 06/16/2011, 3:50 PM

## 2011-06-16 NOTE — Progress Notes (Signed)
Patient ID: John [name], male   DOB: 10-Nov-1940, 71 y.o.   MRN: [id number] John [name] is a 71 y.o. male  Subjective:  " I don't want to go through this therapy" Several liquid stools yesterday and one this am.  Objective: Vital signs in last 24 hours: Temp:  [98.3 F (36.8 C)-99 F (37.2 C)] 98.3 F (36.8 C) (04/26 0500) Pulse Rate:  [91-101] 91  (04/26 0500) Resp:  [17-18] 18  (04/26 0500) BP: (134-158)/(61-73) 158/73 mmHg (04/26 0500) SpO2:  [96 %-98 %] 96 % (04/26 0500) Weight change:  Last BM Date: 06/15/11  Intake/Output from previous day: 04/25 0701 - 04/26 0700 In: 1200 [P.O.:1200] Out: 1150 [Urine:1150] Last cbgs: CBG (last 3)   Basename 06/15/11 2138 06/15/11 1710 06/15/11 1138  GLUCAP 202* 260* 282*     Physical Exam Gen NAD Mood/affect approp Cor:  RRR no Murmur Lungs:  Clear Abd:  +BS soft NT Skin:  Excoriation perianal mild/mod Ext:  No C/C/E Motors: 5/5 in UEs, 4/5 in LEs Mood/affect- no agitation, follows commands  Lab Results: Results for orders placed during the hospital encounter of 06/08/11 (from the past 24 hour(s))  GLUCOSE, CAPILLARY     Status: Abnormal   Collection Time   06/15/11  7:29 AM      Component Value Range   Glucose-Capillary 175 (*) 70 - 99 (mg/dL)   Comment 1 Notify RN    CLOSTRIDIUM DIFFICILE BY PCR     Status: Normal   Collection Time   06/15/11  9:58 AM      Component Value Range   C difficile by pcr NEGATIVE  NEGATIVE   GLUCOSE, CAPILLARY     Status: Abnormal   Collection Time   06/15/11 11:38 AM      Component Value Range   Glucose-Capillary 282 (*) 70 - 99 (mg/dL)   Comment 1 Notify RN    GLUCOSE, CAPILLARY     Status: Abnormal   Collection Time   06/15/11  5:10 PM      Component Value Range   Glucose-Capillary 260 (*) 70 - 99 (mg/dL)   Comment 1 Notify RN    GLUCOSE, CAPILLARY     Status: Abnormal   Collection Time   06/15/11  9:38 PM      Component Value Range   Glucose-Capillary 202 (*) 70 - 99  (mg/dL)   Comment 1 Notify RN       Studies/Results: No results found.  Medications: I have reviewed the patient's current medications.Has been on Cipro for 5 days. On high dose protonix Assessment: 1. Functional deficits secondary to Lumbar radiculopathy s/p L5-S1 decompression with muliple post op complications which require15 hrs per week of interdisciplinary therapy in a comprehensive inpatient rehab setting.  Physiatrist is providing close team supervision and 24 hour management of active medical problems listed below.  Physiatrist and rehab team continue to assess barriers to discharge/monitor patient progress toward functional and medical goals. Not participating well in therapy for several reasons.  Oriented to place and situation Team conf today  Plan: 1. lumbar HNP with radiculopathy. Status post laminotomies and facetotomies with postoperative respiratory failure and anoxia  2. Mood/depression/anxiety. Continue Seroquel and Lamictal and vilazodone. Check sleep chart. Discuss cognitive baseline with family.He is not tolerating movement and has fear of movement, pain meds are likely to confuse him further 3. DVT Prophylaxis/Anticoagulation: SCDs. Monitor for deep vein thrombosis  4. Pain Management:oxycodone IR 5 mg every 6 hours as needed pain  5. History of renal transplant 2002. Followup renal services as needed.  Latest creatinine reviewed Continue CellCept, Prograf and prednisone 5 mg daily  6. COPD/status post thoracentesis 06/01/2011. Followup pulmonary services as needed. Continue respiratory therapy/Advair/Atrovent/Proventil. Patient with chronic oxygen therapy. Check oxygen saturations every shift  7. Diabetes mellitus. Latest hemoglobin A1c of 6.7. Lantus insulin 15 units daily.will increase Patient with insulin pump prior to admission.  8. Dysphagia/decreased nutritional storage. Followup speech therapy. Monitor for aspiration. Obtain dietary consult  9. Parkinson's  disease. Sinemet 25-100 3 times a day  10. 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 Continue aspirin    11. 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 If pt can be re directed,overall now participating.  I believe pt is more aware of deficits and encouraged him to con't PT/OT/SLP.  He agreed but will also talk to wife regarding NHP. 12.  Urinary retention due to immobility and poss diabetic cystopathy Foley- D/C d, PVRs cont flomax, I/O cath 13.  Diarrhea, c diff neg x2  Suspect cipro will d/c and recheck UA C and S       Length of stay, days: 8  Binta Statzer E ,  06/16/2011, 7:25 AM   06/16/2011, 7:25 AM

## 2011-06-16 NOTE — Progress Notes (Signed)
Speech Language Pathology Daily Session Note & Weekly Progress Note  Patient Details  Name: John [name] MRN: [id number] Date of Birth: 03-29-1940  Today's Date: 06/16/2011 Time: 7829-5621 Time Calculation (min): 45 min  Short Term Goals: Week 1: SLP Short Term Goal 1 (Week 1): Pt will demonstrate sustained attention to a functional task for ~10 minutes with Mod A verbal cues for redirection SLP Short Term Goal 2 (Week 1): Pt will demonstrate orientation to situation, time and place with Mod A contextual and semantic cues.  SLP Short Term Goal 3 (Week 1): Pt will demonstrate functional problem solving with familiar tasks with Mod A verbal cues. SLP Short Term Goal 4 (Week 1): Pt will demonstrate safety awareness and will utilize call bell to express wants/needs with Mod A verbal and semantic cues.   Skilled Therapeutic Interventions: Session focused on dysphagia and cognition treatment with focus on safely self feeding; Patient requested assistance to get out of bed so that he could go to the bathroom this am; however, patient had already had incontinent bowel movement.  SLP facilitated getting cleaned up with simple 1 step directions for bed mobility tasks and max assist tactile cues for hand placement.  Patient consumed Dys.3 textures and thin liquids via cup with total assist, but was willing to let SLP fed him despite verbally refusing.  Patient presented with some increased confusion apparent in his language today as compared to yesterday's session and was more upset about his current situation revealing some increase in awareness; however, continues to need total assist to make safe choices during session.    Daily Session FIM:  Comprehension Comprehension Mode: Auditory Comprehension: 2-Understands basic 25 - 49% of the time/requires cueing 51 - 75% of the time Expression Expression Mode: Verbal Expression: 3-Expresses basic 50 - 74% of the time/requires cueing 25 - 50% of the  time. Needs to repeat parts of sentences. Social Interaction Social Interaction: 3-Interacts appropriately 50 - 74% of the time - May be physically or verbally inappropriate. Problem Solving Problem Solving: 1-Solves basic less than 25% of the time - needs direction nearly all the time or does not effectively solve problems and may need a restraint for safety Memory Memory: 1-Recognizes or recalls less than 25% of the time/requires cueing greater than 75% of the time FIM - Eating Eating Activity: 4: Helper checks for pocketed food Pain Pain Assessment Pain Assessment: No/denies pain Pain Score: 0-No pain  Therapy/Group: Individual Therapy   Speech Language Pathology Weekly Progress Note  Patient Details  Name: John [name] MRN: [id number] Date of Birth: 1940/05/07  Today's Date: 06/16/2011  Short Term Goals: Week 1: SLP Short Term Goal 1 (Week 1): Pt will demonstrate sustained attention to a functional task for ~10 minutes with Mod A verbal cues for redirection SLP Short Term Goal 1 - Progress (Week 1): Progressing toward goal SLP Short Term Goal 2 (Week 1): Pt will demonstrate orientation to situation, time and place with Mod A contextual and semantic cues.  SLP Short Term Goal 2 - Progress (Week 1): Progressing toward goal SLP Short Term Goal 3 (Week 1): Pt will demonstrate functional problem solving with familiar tasks with Mod A verbal cues. SLP Short Term Goal 3 - Progress (Week 1): Progressing toward goal SLP Short Term Goal 4 (Week 1): Pt will demonstrate safety awareness and will utilize call bell to express wants/needs with Mod A verbal and semantic cues.  SLP Short Term Goal 4 - Progress (Week 4): Progressing toward goal  Week 2: SLP Short Term Goal 1 (Week 2): Pt will demonstrate sustained attention to a functional task for ~10 minutes with Mod A verbal cues for redirection SLP Short Term Goal 2 (Week 2): Pt will demonstrate orientation to situation, time and place with  Mod A contextual and semantic cues. SLP Short Term Goal 3 (Week 2): Pt will demonstrate functional problem solving with familiar tasks with Mod A verbal cues. SLP Short Term Goal 4 (Week 2): Pt will demonstrate safety awareness and will utilize call bell to express wants/needs with Mod A verbal and semantic cues. SLP Short Term Goal 5 (Week 2): Patient will comsume regular textures and thin liquids without oberved s/s of aspiration and moderate assist semantic cues to utilize strategies.  Weekly Progress Updates: While patient did not meet any of his goals this reporting period due to medical and cognitive reasons, he has made progress by allowing [place] to care for him such as allowing the SLP to feed him and beginning to initiate self feeding by managing cup and bites occasionally.  Due to the complication from raspatory failure this patient will demonstrate fluctuating cognitive abilities from day to day but looking at progress from the week he is making functional gains in awareness, willingness to let us care/treat him and some intermittent initiation of self care tasks.  As a result, this patient would continue to benefit from skilled SLP services to address these deficits and reduce burden of care upon discharge.   SLP Frequency: 1-2 X/day, 30-60 minutes;5 out of 7 days SLP Treatment/Interventions: Cognitive remediation/compensation;Cueing hierarchy;Dysphagia/aspiration precaution training;Environmental controls;Functional tasks;Internal/external aids;Patient/family education;Speech/Language facilitation;Therapeutic Activities  Daily Session Cognition: Overall Cognitive Status: Impaired Arousal/Alertness: Awake/alert Orientation Level: Oriented to person Attention: Sustained Focused Attention: Appears intact Sustained Attention: Impaired Sustained Attention Impairment: Verbal basic;Functional basic Memory: Impaired Memory Impairment: Storage deficit;Retrieval deficit;Decreased long term  memory;Decreased short term memory Awareness: Impaired Awareness Impairment: Intellectual impairment Problem Solving: Impaired Problem Solving Impairment: Verbal basic;Functional basic Executive Function: Reasoning;Organizing;Decision Making;Initiating;Self Monitoring;Self Correcting Reasoning: Impaired Reasoning Impairment: Verbal basic;Functional basic Sequencing: Impaired Sequencing Impairment: Verbal basic;Functional basic Organizing: Impaired Organizing Impairment: Verbal basic;Functional basic Decision Making: Impaired Decision Making Impairment: Verbal basic;Functional basic Initiating: Impaired Initiating Impairment: Verbal basic;Functional basic Self Monitoring: Impaired Self Monitoring Impairment: Verbal basic;Functional basic Self Correcting: Impaired Self Correcting Impairment: Verbal basic;Functional basic Behaviors: Poor frustration tolerance;Verbal agitation Safety/Judgment: Impaired Oral/Motor: Oral Motor/Sensory Function Overall Oral Motor/Sensory Function: Appears within functional limits for tasks assessed Labial ROM: Reduced right;Reduced left Labial Symmetry: Within Functional Limits Labial Strength: Reduced Labial Sensation: Within Functional Limits Lingual ROM: Reduced right;Reduced left Lingual Symmetry: Within Functional Limits Lingual Strength: Reduced Lingual Sensation: Within Functional Limits Facial ROM: Reduced right Motor Speech Overall Motor Speech: Appears within functional limits for tasks assessed Motor Planning: Witnin functional limits Comprehension: Auditory Comprehension Overall Auditory Comprehension: Impaired Yes/No Questions: Impaired Complex Questions: 25-49% accurate Commands: Impaired Two Step Basic Commands: 25-49% accurate Conversation: Simple Interfering Components: Attention;Working memory;Processing speed EffectiveTechniques: Dietitian: Not  tested Reading Comprehension Reading Status: Not tested Expression: Expression Primary Mode of Expression: Verbal Verbal Expression Overall Verbal Expression: Impaired Initiation: No impairment Level of Generative/Spontaneous Verbalization: Sentence;Phrase Repetition: No impairment Naming: Impairment Other Naming Comments: word finding; semantic, and phonemic paraphasias at times along with some language of confusion Pragmatics: Impairment Impairments: Eye contact;Turn Taking;Topic appropriateness;Topic maintenance Interfering Components: Attention Effective Techniques: Semantic cues Written Expression Dominant Hand: Right Written Expression: Not tested  Fae Pippin, M.A., CCC-SLP [phone]  Viera Okonski 06/16/2011, 4:24 PM

## 2011-06-16 NOTE — Telephone Encounter (Signed)
Form faxed to the number provided.  Called spoke with patient's wife, informed her that the form has been faxed as requested.  Offered to mail pt a copy, but wife stated this was not necessary.  Original sent to be scanned into epic.  Will sign off.

## 2011-06-16 NOTE — Progress Notes (Signed)
Nutrition Follow-up  SLP reports attention waxes and wanes. Lethargy has improved. Pt needs cueing and at times, full assistance with meals. Consumed 50% of breakfast. Will consume supplements.  Diet Order:  Dysphagia 3 with thins Supplements: Ensure Pudding TID, Glucerna Shakes BID  Meds: Scheduled Meds:   . albuterol  2.5 mg Nebulization TID  . antiseptic oral rinse  1 application Mouth Rinse BID  . aspirin  81 mg Per Tube Daily  . calcium carbonate  2 tablet Per Tube Daily  . carbidopa-levodopa  1 tablet Per Tube TID  . clopidogrel  75 mg Oral Q breakfast  . ezetimibe  10 mg Oral Daily  . feeding supplement  1 Container Oral TID BM  . feeding supplement  237 mL Oral BID BM  . ferrous sulfate  325 mg Oral Q breakfast  . folic acid  1 mg Oral Daily  . furosemide  80 mg Oral Daily  . insulin aspart  0-20 Units Subcutaneous TID WC  . insulin glargine  30 Units Subcutaneous Daily  . ipratropium  0.5 mg Nebulization TID  . isosorbide mononitrate  60 mg Oral Daily  . lamoTRIgine  200 mg Oral Daily  . magnesium oxide  400 mg Oral BID  . mycophenolate  500 mg Oral BID  . pantoprazole  40 mg Oral Q1200  . predniSONE  5 mg Oral Q breakfast  . pyridostigmine  60 mg Oral TID  . QUEtiapine  50 mg Oral BID  . saccharomyces boulardii  500 mg Oral BID  . tacrolimus  5 mg Oral BID  . Tamsulosin HCl  0.4 mg Oral Daily  . Vilazodone HCl  20 mg Oral Daily  . DISCONTD: ciprofloxacin  250 mg Oral BID  . DISCONTD: metroNIDAZOLE  500 mg Oral Q8H  . DISCONTD: pantoprazole  40 mg Oral BID AC   Continuous Infusions:  PRN Meds:.acetaminophen, albuterol, alum & mag hydroxide-simeth, diphenoxylate-atropine, ondansetron (ZOFRAN) IV, ondansetron, oxyCODONE, sodium chloride, sorbitol  Labs:  CMP     Component Value Date/Time   NA 137 06/13/2011 1255   K 3.9 06/13/2011 1255   CL 100 06/13/2011 1255   CO2 28 06/13/2011 1255   GLUCOSE 347* 06/13/2011 1255   BUN 20 06/13/2011 1255   CREATININE 1.01  06/13/2011 1255   CALCIUM 9.6 06/13/2011 1255   PROT 6.2 06/13/2011 1255   ALBUMIN 1.9* 06/13/2011 1255   AST 11 06/13/2011 1255   ALT 7 06/13/2011 1255   ALKPHOS 120* 06/13/2011 1255   BILITOT 0.7 06/13/2011 1255   GFRNONAA 73* 06/13/2011 1255   GFRAA 84* 06/13/2011 1255     Intake/Output Summary (Last 24 hours) at 06/16/11 1029 Last data filed at 06/16/11 1028  Gross per 24 hour  Intake    720 ml  Output   1530 ml  Net   -810 ml    Weight Status:  93.9 kg (4/24) - wt down 7.1 kg x 1 week  Estimated needs:  2150 - 2350 kcal, 120 - 130 grams protein  Nutrition Dx:  Inadequate oral intake r/t poor appetite, severe deconditioning, fatigue AEB poor PO intake x several days and decreasing weight trend.  Goal:  Pt to consume >/= 50% of meals and supplements.  Intervention:   1. Continue oral nutrition supplements, and follow SLP recommendations for best feeding practices. 2. If intake does not improve, recommend short-term enteral nutrition via enteral feeding tube.  Continuous regimen recommendation (to meet 100% of nutrition needs: Initiate Glucerna 1.2 formula at  15 ml/hr, advance by 10 ml/hr every 8 hours, or as tolerated, to goal of 85 ml/hr x 2 hours (anticipate TF to be held for 4 hours daily for participation in therapies). 30 ml Prostat via tube BID. TF regimen will provide: 2184 kcal, 132 grams protein, 1369 ml free water. Will meet 100% of minimum estimated kcal and protein needs.  Nocturnal regimen recommendation (to meet 50% of nutrition needs): Initiate Glucerna 1.2 formula at 15 ml/hr, advance by 10 ml/hr every 8 hours, or as tolerated, to goal of 75 ml/hr x 12 hours. 30 ml Prostat via tube daily. TF regimen will provide: 1152 kcal, 69 grams, 725 ml free water. Will meet 54% of minimum estimated kcal needs and 58% of minimum estimated protein needs.  Monitor:  Weights, labs, I/O's, PO intake  Adair Laundry Pager #:  [phone]

## 2011-06-16 NOTE — Plan of Care (Signed)
Problem: RH BOWEL ELIMINATION Goal: RH STG MANAGE BOWEL WITH ASSISTANCE STG Manage Bowel with max Assistance.  Outcome: Not Progressing Pt incont. At time

## 2011-06-16 NOTE — Telephone Encounter (Signed)
Do I have the form?

## 2011-06-16 NOTE — Telephone Encounter (Signed)
Called, spoke with pt's spouse, Okey Regal.  She is aware Dr. Delford Field is off until Wednesday, May 1 but states the [employer] Form is due by April 30.  Tammy, pls advise if you can fill out this form for pt?  You seen the pt in December.  Pls advise.  Thank you.    Note:  Pt's wife would like to be called whenever this form has been completed.

## 2011-06-17 DIAGNOSIS — Z5189 Encounter for other specified aftercare: Secondary | ICD-10-CM

## 2011-06-17 DIAGNOSIS — IMO0002 Reserved for concepts with insufficient information to code with codable children: Secondary | ICD-10-CM

## 2011-06-17 DIAGNOSIS — R5381 Other malaise: Secondary | ICD-10-CM

## 2011-06-17 LAB — URINALYSIS, ROUTINE W REFLEX MICROSCOPIC
Glucose, UA: NEGATIVE mg/dL
Ketones, ur: NEGATIVE mg/dL
Leukocytes, UA: NEGATIVE
Protein, ur: 100 mg/dL — AB

## 2011-06-17 LAB — BASIC METABOLIC PANEL
BUN: 18 mg/dL (ref 6–23)
CO2: 28 mEq/L (ref 19–32)
Chloride: 101 mEq/L (ref 96–112)
Creatinine, Ser: 0.82 mg/dL (ref 0.50–1.35)
GFR calc Af Amer: >90 mL/min (ref >90)
Glucose, Bld: 324 mg/dL — ABNORMAL HIGH (ref 70–99)

## 2011-06-17 LAB — GLUCOSE, CAPILLARY

## 2011-06-17 LAB — URINE MICROSCOPIC-ADD ON

## 2011-06-17 NOTE — Progress Notes (Signed)
No void this shift. I & O cath for 300cc's total during night. UA C&S sent this AM. No stools in past 12 hours. Tawanna Solo

## 2011-06-17 NOTE — Progress Notes (Signed)
Patient ID: John [name], male   DOB: Mar 13, 1940, 71 y.o.   MRN: [id number] Patient ID: John [name], male   DOB: May 14, 1940, 71 y.o.   MRN: [id number] John [name] is a 71 y.o. male  Subjective:  "why won'[name] someone help me get up in bed?" Multiple loose stools yesterday. c diff negative   Objective: Vital signs in last 24 hours: Temp:  [98.6 F (37 C)] 98.6 F (37 C) (04/27 0622) Pulse Rate:  [89-95] 89  (04/27 0622) Resp:  [18] 18  (04/27 0622) BP: (134-160)/(67-80) 160/80 mmHg (04/27 0622) SpO2:  [97 %-98 %] 97 % (04/27 0756) Weight change:  Last BM Date: 06/16/11  Intake/Output from previous day: 04/26 0701 - 04/27 0700 In: 840 [P.O.:840] Out: 3982 [Urine:3980; Stool:2] Last cbgs: CBG (last 3)   Basename 06/17/11 0742 06/16/11 2144 06/16/11 1703  GLUCAP 162* 153* 122*     Physical Exam Gen NAD Mood/affect approp Cor:  RRR no Murmur Lungs:  Clear Abd:  +BS soft NT Skin:  Excoriation perianal mild/mod Ext:  No C/C/E Motors: 5/5 in UEs, 4/5 in LEs, poor truncal control. Intentional tremor Mood/affect- no agitation, follows commands,   Lab Results: Results for orders placed during the hospital encounter of 06/08/11 (from the past 24 hour(s))  GLUCOSE, CAPILLARY     Status: Abnormal   Collection Time   06/16/11 11:40 AM      Component Value Range   Glucose-Capillary 255 (*) 70 - 99 (mg/dL)  GLUCOSE, CAPILLARY     Status: Abnormal   Collection Time   06/16/11  5:03 PM      Component Value Range   Glucose-Capillary 122 (*) 70 - 99 (mg/dL)  GLUCOSE, CAPILLARY     Status: Abnormal   Collection Time   06/16/11  9:44 PM      Component Value Range   Glucose-Capillary 153 (*) 70 - 99 (mg/dL)   Comment 1 Notify RN    URINALYSIS, ROUTINE W REFLEX MICROSCOPIC     Status: Abnormal   Collection Time   06/17/11  6:01 AM      Component Value Range   Color, Urine AMBER (*) YELLOW    APPearance CLEAR  CLEAR    Specific Gravity, Urine 1.024  1.005 - 1.030    pH  6.0  5.0 - 8.0    Glucose, UA NEGATIVE  NEGATIVE (mg/dL)   Hgb urine dipstick SMALL (*) NEGATIVE    Bilirubin Urine NEGATIVE  NEGATIVE    Ketones, ur NEGATIVE  NEGATIVE (mg/dL)   Protein, ur 782 (*) NEGATIVE (mg/dL)   Urobilinogen, UA 0.2  0.0 - 1.0 (mg/dL)   Nitrite NEGATIVE  NEGATIVE    Leukocytes, UA NEGATIVE  NEGATIVE   URINE MICROSCOPIC-ADD ON     Status: Normal   Collection Time   06/17/11  6:01 AM      Component Value Range   Squamous Epithelial / LPF RARE  RARE    WBC, UA 3-6  <3 (WBC/hpf)   RBC / HPF 0-2  <3 (RBC/hpf)  GLUCOSE, CAPILLARY     Status: Abnormal   Collection Time   06/17/11  7:42 AM      Component Value Range   Glucose-Capillary 162 (*) 70 - 99 (mg/dL)     Studies/Results: No results found.  Medications: I have reviewed the patient's current medications.Has been on Cipro for 5 days. On high dose protonix Assessment: 1. Functional deficits secondary to Lumbar radiculopathy s/p L5-S1 decompression with muliple post  op complications which require15 hrs per week of interdisciplinary therapy in a comprehensive inpatient rehab setting.  Physiatrist is providing close team supervision and 24 hour management of active medical problems listed below.  Physiatrist and rehab team continue to assess barriers to discharge/monitor patient progress toward functional and medical goals. Not participating well in therapy for several reasons.  Oriented to place and situation Team conf today  Plan: 1. lumbar HNP with radiculopathy. Status post laminotomies and facetotomies with postoperative respiratory failure and anoxia  2. Mood/depression/anxiety. Continue Seroquel and Lamictal and vilazodone. Check sleep chart. Discuss cognitive baseline with family.He is not tolerating movement and has fear of movement, pain meds are likely to confuse him further 3. DVT Prophylaxis/Anticoagulation: SCDs. Monitor for deep vein thrombosis  4. Pain Management:oxycodone IR 5 mg every 6 hours as  needed pain  5. History of renal transplant 2002. Followup renal services as needed.  Latest creatinine reviewed Continue CellCept, Prograf and prednisone 5 mg daily  6. COPD/status post thoracentesis 06/01/2011. Followup pulmonary services as needed. Continue respiratory therapy/Advair/Atrovent/Proventil. Patient with chronic oxygen therapy. Check oxygen saturations every shift  7. Diabetes mellitus. Latest hemoglobin A1c of 6.7. Lantus insulin 15 units daily.will increase Patient with insulin pump prior to admission.  8. Dysphagia/decreased nutritional storage. Followup speech therapy. Monitor for aspiration. Obtain dietary consult  9. Parkinson's disease. Sinemet 25-100 3 times a day  10. 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 Continue aspirin    11. 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 If pt can be re directed,overall now participating.  I believe pt is more aware of deficits and encouraged him to con'[name] PT/OT/SLP.  He agreed but will also talk to wife regarding NHP. 12.  Urinary retention due to immobility and poss diabetic cystopathy Foley- D/C d, PVRs cont flomax, I/O cath 13.  Diarrhea, c diff neg x2  -dc contact precautions  -florastor added and cipro stopped.(ucx pending)   -check bmet today  -push fluids  -?hx of diarrhea chronically?       Length of stay, days: 9  John [name] ,  06/17/2011, 8:31 AM   06/17/2011, 8:31 AM

## 2011-06-17 NOTE — Progress Notes (Signed)
Physical Therapy Note  Patient Details  Name: HAYATO GUAMAN MRN: [id number] Date of Birth: 01/17/1941 Today's Date: 06/17/2011  1500-1540 (40 minutes) individual Pain : "I hurt all over" /unrated pain/premedicated Focus of treatment: Bed mobility training; transfers to wc to improve tolerance to out of bed Treatment: supine to side (left) to sit mod assist with max tactile cues for sequencing using bedrail; sitting edge of bed with posterior trunk lean (mod assist); transfer using SARA + +2 assist; pt up in wc approxilmately 10 minutes before bowel incontinence. Attempted to return to bed with SARA+; Pt too fatigues and returned to bed with Maxi Move.   Teren Zurcher,JIM 06/17/2011, 2:44 PM

## 2011-06-18 LAB — URINE CULTURE: Colony Count: NO GROWTH

## 2011-06-18 LAB — GLUCOSE, CAPILLARY
Glucose-Capillary: 190 mg/dL — ABNORMAL HIGH (ref 70–99)
Glucose-Capillary: 261 mg/dL — ABNORMAL HIGH (ref 70–99)
Glucose-Capillary: 184 mg/dL — ABNORMAL HIGH (ref 70–99)

## 2011-06-18 MED ORDER — INSULIN GLARGINE 100 UNIT/ML ~~LOC~~ SOLN
30.0000 [IU] | Freq: Every day | SUBCUTANEOUS | Status: DC
  Administered 2011-06-18 – 2011-06-19 (×2): 30 [IU] via SUBCUTANEOUS

## 2011-06-18 MED ORDER — INSULIN ASPART 100 UNIT/ML ~~LOC~~ SOLN
4.0000 [IU] | Freq: Three times a day (TID) | SUBCUTANEOUS | Status: DC
  Administered 2011-06-18 – 2011-06-19 (×5): 4 [IU] via SUBCUTANEOUS

## 2011-06-18 MED ORDER — ALTEPLASE 2 MG IJ SOLR
2.0000 mg | Freq: Once | INTRAMUSCULAR | Status: AC
  Administered 2011-06-18: 2 mg
  Filled 2011-06-18: qty 2

## 2011-06-18 MED ORDER — INSULIN GLARGINE 100 UNIT/ML ~~LOC~~ SOLN: 35.0000 [IU] | Freq: Every day | SUBCUTANEOUS | Status: DC

## 2011-06-18 NOTE — Progress Notes (Addendum)
John [name] is a 71 y.o. male  Subjective:  no new issues. No loose stool yesterday. The patient slept well Other ROS reviewed and negative   Objective: Vital signs in last 24 hours: Temp:  [98.1 F (36.7 C)-98.2 F (36.8 C)] 98.2 F (36.8 C) (04/28 0615) Pulse Rate:  [87-88] 87  (04/28 0615) Resp:  [18-20] 20  (04/28 0615) BP: (119-147)/(57-65) 147/65 mmHg (04/28 0615) SpO2:  [88 %-98 %] 96 % (04/28 0615) Weight change:  Last BM Date: 06/17/11  Intake/Output from previous day: 04/27 0701 - 04/28 0700 In: 520 [P.O.:520] Out: 600 [Urine:600] Last cbgs: CBG (last 3)   Basename 06/17/11 2042 06/17/11 1635 06/17/11 1158  GLUCAP 188* 180* 318*     Physical Exam Gen NAD Mood/affect approp Cor:  RRR no Murmur Lungs:  Clear Abd:  +BS soft NT Skin:  Excoriation perianal mild/mod Ext:  No C/C/E Motors: 5/5 in UEs, 4/5 in LEs, poor truncal control. Intentional tremor/pill rolling tremor Mood/affect- no agitation, follows commands,   Lab Results: Results for orders placed during the hospital encounter of 06/08/11 (from the past 24 hour(s))  GLUCOSE, CAPILLARY     Status: Abnormal   Collection Time   06/17/11  7:42 AM      Component Value Range   Glucose-Capillary 162 (*) 70 - 99 (mg/dL)  BASIC METABOLIC PANEL     Status: Abnormal   Collection Time   06/17/11 11:25 AM      Component Value Range   Sodium 135  135 - 145 (mEq/L)   Potassium 3.9  3.5 - 5.1 (mEq/L)   Chloride 101  96 - 112 (mEq/L)   CO2 28  19 - 32 (mEq/L)   Glucose, Bld 324 (*) 70 - 99 (mg/dL)   BUN 18  6 - 23 (mg/dL)   Creatinine, Ser 7.82  0.50 - 1.35 (mg/dL)   Calcium 9.0  8.4 - 95.6 (mg/dL)   GFR calc non Af Amer 87 (*) >90 (mL/min)   GFR calc Af Amer >90  >90 (mL/min)  GLUCOSE, CAPILLARY     Status: Abnormal   Collection Time   06/17/11 11:58 AM      Component Value Range   Glucose-Capillary 318 (*) 70 - 99 (mg/dL)  GLUCOSE, CAPILLARY     Status: Abnormal   Collection Time   06/17/11  4:35  PM      Component Value Range   Glucose-Capillary 180 (*) 70 - 99 (mg/dL)  GLUCOSE, CAPILLARY     Status: Abnormal   Collection Time   06/17/11  8:42 PM      Component Value Range   Glucose-Capillary 188 (*) 70 - 99 (mg/dL)   Comment 1 Notify RN       Studies/Results: No results found.  Medications: I have reviewed the patient's current medications.Has been on Cipro for 5 days. On high dose protonix Assessment: 1. Functional deficits secondary to Lumbar radiculopathy s/p L5-S1 decompression with muliple post op complications which require15 hrs per week of interdisciplinary therapy in a comprehensive inpatient rehab setting.  Physiatrist is providing close team supervision and 24 hour management of active medical problems listed below.  Physiatrist and rehab team continue to assess barriers to discharge/monitor patient progress toward functional and medical goals. Not participating well in therapy for several reasons.  Oriented to place and situation Team conf today  Plan: 1. lumbar HNP with radiculopathy. Status post laminotomies and facetotomies with postoperative respiratory failure and anoxia  2. Mood/depression/anxiety. Continue Seroquel and  Lamictal and vilazodone.  3. DVT Prophylaxis/Anticoagulation: SCDs. Monitor for deep vein thrombosis  4. Pain Management:oxycodone IR 5 mg every 6 hours as needed pain  5. History of renal transplant 2002. Followup renal services as needed.  Latest creatinine reviewed Continue CellCept, Prograf and prednisone 5 mg daily  6. COPD/status post thoracentesis 06/01/2011. Followup pulmonary services as needed. Continue respiratory therapy/Advair/Atrovent/Proventil. Patient with chronic oxygen therapy. Sats are satisfactory 7. Diabetes mellitus. Latest hemoglobin A1c of 6.7. Continue lantus 30 units qday. Add scheduled novolog with meals with holds if cbg's too low.  Patient with insulin pump prior to admission.  8. Dysphagia/decreased nutritional  storage. Followup speech therapy. Monitor for aspiration. Obtain dietary consult  9. Parkinson's disease. Sinemet 25-100 3 times a day  10. 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 Continue aspirin    11. 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 If pt can be re directed,overall now participating.  I believe pt is more aware of deficits and encouraged him to con't PT/OT/SLP.  He agreed but will also talk to wife regarding NHP. 12.  Urinary retention due to immobility and poss diabetic cystopathy Foley- D/C d, PVRs cont flomax, I/O cath 13.  Diarrhea, c diff neg x2  -dc contact precautions  -florastor added and cipro stopped.(ucx pending)   -bmet ok yesterday. Diarrhea seems to have stopped  -push fluids  -?hx of diarrhea chronically?       Length of stay, days: 10  Wayne Wicklund T ,  06/18/2011, 6:49 AM   06/18/2011, 6:49 AM

## 2011-06-18 NOTE — Progress Notes (Signed)
Occupational Therapy Session Note  Patient Details  Name: John [name] MRN: [id number] Date of Birth: 06/13/1940  Today's Date: 06/18/2011 Time:  - 14;15-1500  (45 min)    Short Term Goals: Week 1:  OT Short Term Goal 1 (Week 1): Pt will complete UB bathing with min assist in sitting position. OT Short Term Goal 1 - Progress (Week 1): Progressing toward goal OT Short Term Goal 2 (Week 1): Pt will complete LB bathing with mod assist in sit to stand position. OT Short Term Goal 2 - Progress (Week 1): Progressing toward goal OT Short Term Goal 3 (Week 1): Pt will complete UB dressing with mod assist in seated position. OT Short Term Goal 3 - Progress (Week 1): Met OT Short Term Goal 4 (Week 1): Pt will complete LB dressing with mod assist in sit to stand position. OT Short Term Goal 4 - Progress (Week 1): Progressing toward goal OT Short Term Goal 5 (Week 1): Pt will complete stand pivot transfer with max assist to [facility]. OT Short Term Goal 5 - Progress (Week 1): Progressing toward goal  Skilled Therapeutic Interventions/Progress Updates:    Engaged in therapeutic activities for bed mobility, (max assist),  Sitting EOB for 25 minutes with min to total assist for midline control, and stretches.  Pt has posterior bias and to the left.  Practiced weight shifting, a/p tilts, lower body mobs, increased weight bearing on right hip.  Pt. Tolerated the session but wanted to return to bed.  Therapy Documentation Precautions:  Precautions Precautions: Back;Fall Precaution Comments: No bending, arching, twisting; on supplemental 02 to maintain sats >92% Restrictions Weight Bearing Restrictions: No    Pain:6.10 low back:  SEE MAR    Exercises: Did midline conrtol EOB and reaching   Therapy/Group: Individual Therapy  Humberto Seals 06/18/2011, 3:01 PM

## 2011-06-19 LAB — CBC
HCT: 30.4 % — ABNORMAL LOW (ref 39.0–52.0)
Hemoglobin: 9.4 g/dL — ABNORMAL LOW (ref 13.0–17.0)
MCH: 22.5 pg — ABNORMAL LOW (ref 26.0–34.0)
MCHC: 30.9 g/dL (ref 30.0–36.0)

## 2011-06-19 LAB — BASIC METABOLIC PANEL
BUN: 14 mg/dL (ref 6–23)
Chloride: 97 mEq/L (ref 96–112)
Glucose, Bld: 286 mg/dL — ABNORMAL HIGH (ref 70–99)
Potassium: 4 mEq/L (ref 3.5–5.1)

## 2011-06-19 LAB — GLUCOSE, CAPILLARY: Glucose-Capillary: 119 mg/dL — ABNORMAL HIGH (ref 70–99)

## 2011-06-19 LAB — DIFFERENTIAL
Basophils Relative: 0 % (ref 0–1)
Eosinophils Absolute: 0.2 10*3/uL (ref 0.0–0.7)
Lymphs Abs: 1.9 10*3/uL (ref 0.7–4.0)
Monocytes Absolute: 0.5 10*3/uL (ref 0.1–1.0)
Neutro Abs: 4.9 10*3/uL (ref 1.7–7.7)

## 2011-06-19 MED ORDER — QUETIAPINE FUMARATE 25 MG PO TABS
25.0000 mg | ORAL_TABLET | Freq: Two times a day (BID) | ORAL | Status: DC
  Administered 2011-06-19 – 2011-06-28 (×18): 25 mg via ORAL
  Filled 2011-06-19 (×20): qty 1

## 2011-06-19 NOTE — Progress Notes (Signed)
Physical Therapy Session Note  Patient Details  Name: John [name] MRN: [id number] Date of Birth: 10-09-1940  Today's Date: 06/19/2011 Time: 1345-1450 Time Calculation (min): 65 min  Short Term Goals: Week 1:  PT Short Term Goal 1 (Week 1): Patient will perform bed mobility with mod A while recalling 2/3 back precautions PT Short Term Goal 1 - Progress (Week 1): Revised due to lack of progress PT Short Term Goal 2 (Week 1): Patient will tolerate sitting EOB x 20-30 minutes with improved trunk posture for functional activities PT Short Term Goal 2 - Progress (Week 1): Revised due to lack of progress PT Short Term Goal 3 (Week 1): Patient will transfer bed <> w/c with mod A with attention to back precautions PT Short Term Goal 3 - Progress (Week 1): Revised due to lack of progress PT Short Term Goal 4 (Week 1): Patient will perform w/c mobility x 150 on unit with mod A for endurance training PT Short Term Goal 4 - Progress (Week 1): Revised due to lack of progress PT Short Term Goal 5 (Week 1): Patient will tolerate 5 minutes of dynamic standing balance, endurance and pre-gait training PT Short Term Goal 5 - Progress (Week 1): Revised due to lack of progress Week 2:  PT Short Term Goal 1 (Week 2): Patient will perform rolling in bed and sup <> sit with max A and 50-75% verbal cues for attention to task and sequencing PT Short Term Goal 2 (Week 2): Patient will tolerate 5-10 minutes unsupported sitting EOB and 15-20 minutes supported sitting in chair to engage in trunk control, UE and LE strengthening activities PT Short Term Goal 3 (Week 2): Patient will tolerate 5-10 minutes total A supported standing (lift equipment) for pressure relief, WB through bilat LE and trunk control training  PT Short Term Goal 4 (Week 2): Patient will assist 25% with bed <> w/c transfers without use of lift equipment  Therapy Documentation Precautions:  Precautions Precautions: Back;Fall Precaution Comments:  No bending, arching, twisting; on supplemental 02 to maintain sats >92% Restrictions Weight Bearing Restrictions: No Pain: Pain Assessment Pain Assessment: Faces Faces Pain Scale: Hurts even more Pain Type: Acute pain Pain Location: Back Pain Orientation: Lower Pain Descriptors: Aching Pain Onset: With Activity Pain Intervention(s): Rest;Repositioned Exercises:  While sitting in w/c performed trunk L lateral leans with RUE reaching overhead for lateral trunk stretch to assist with maintaining trunk position in midline, also performed 10 reps seated scap retraction rows during anterior chest/pec stretches for increased thoracic extension and chest expansion during deep breaths; bilat LE strengthening on floor bike x 20 revolutions with +2A to maintain foot position.  Returned to room to remain sitting in w/c in reclined/tilted position. Other Treatments: Treatments Therapeutic Activity: Patient incontinent bowels resolved.  Performed bed mobility with log roll and rails with mod A for side to sit; patient able to follow all verbal cues; Sat upright EOB with one UE support and min-mod A while donning SARA sling. Transferred to w/c with SARA plus with +2A; patient able to assist with LE extension but still unable to hold trunk and head erect in standing.  Sitting in upright position in w/c without lateral supports performed dynamic sitting balance, postural control and endurance activity of ball tossing and reaching out of BOS and up with bilat UE to facilitate increased thoracic extension.  Required multiple rest breaks with focused deep breathing to maintain Sp02 >90%.    See FIM for current functional status  Therapy/Group:  Individual Therapy  John [name] [facility] 06/19/2011, 4:54 PM

## 2011-06-19 NOTE — Progress Notes (Signed)
Patient ID: John [name], male   DOB: [birth year], 71 y.o.   MRN: [id number] John [name] is a 71 y.o. male  Subjective: No loose stools Somnolent this am  No c/os   Objective: Vital signs in last 24 hours: Temp:  [98.2 F (36.8 C)-99 F (37.2 C)] 99 F (37.2 C) (04/29 0610) Pulse Rate:  [85-89] 85  (04/29 0610) Resp:  [20] 20  (04/29 0610) BP: (137-167)/(72-78) 167/78 mmHg (04/29 0610) SpO2:  [98 %-100 %] 98 % (04/29 0610) Weight change:  Last BM Date: 06/18/11  Intake/Output from previous day: 04/28 0701 - 04/29 0700 In: 1120 [P.O.:1120] Out: 1101 [Urine:1100; Stool:1] Last cbgs: CBG (last 3)   Basename 06/19/11 0711 06/18/11 2102 06/18/11 1633  GLUCAP 154* 172* 184*     Physical Exam Gen NAD Mood/affect approp Cor:  RRR no Murmur Lungs:  Clear Abd:  +BS soft NT Skin:  Excoriation perianal mild/mod Ext:  No C/C/E Motors: 5/5 in UEs, 4/5 in LEs, poor truncal control. Intentional tremor Mood/affect- no agitation, follows commands,   Lab Results: Results for orders placed during the hospital encounter of 06/08/11 (from the past 24 hour(s))  GLUCOSE, CAPILLARY     Status: Abnormal   Collection Time   06/18/11 11:22 AM      Component Value Range   Glucose-Capillary 261 (*) 70 - 99 (mg/dL)  GLUCOSE, CAPILLARY     Status: Abnormal   Collection Time   06/18/11  4:33 PM      Component Value Range   Glucose-Capillary 184 (*) 70 - 99 (mg/dL)  GLUCOSE, CAPILLARY     Status: Abnormal   Collection Time   06/18/11  9:02 PM      Component Value Range   Glucose-Capillary 172 (*) 70 - 99 (mg/dL)   Comment 1 Notify RN    GLUCOSE, CAPILLARY     Status: Abnormal   Collection Time   06/19/11  7:11 AM      Component Value Range   Glucose-Capillary 154 (*) 70 - 99 (mg/dL)   Comment 1 Notify RN       Studies/Results: No results found.  Medications: I have reviewed the patient's current medications.Has been on Cipro for 5 days. On high dose protonix Assessment: 1.  Functional deficits secondary to Lumbar radiculopathy s/p L5-S1 decompression with muliple post op complications which require15 hrs per week of interdisciplinary therapy in a comprehensive inpatient rehab setting.  Physiatrist is providing close team supervision and 24 hour management of active medical problems listed below.  Physiatrist and rehab team continue to assess barriers to discharge/monitor patient progress toward functional and medical goals. Not participating well in therapy for several reasons.  Oriented to place and situation Team conf today  Plan: 1. lumbar HNP with radiculopathy. Status post laminotomies and facetotomies with postoperative respiratory failure and anoxia  2. Mood/depression/anxiety. Reduce Seroquel due to somnolence, cont Lamictal and vilazodone. Check sleep chart. Discuss cognitive baseline with family3. DVT Prophylaxis/Anticoagulation: SCDs. Monitor for deep vein thrombosis  4. Pain Management:oxycodone IR 5 mg every 6 hours as needed pain  5. History of renal transplant 2002. Followup renal services as needed.  Latest creatinine reviewed Continue CellCept, Prograf and prednisone 5 mg daily  6. COPD/status post thoracentesis 06/01/2011. Followup pulmonary services as needed. Continue respiratory therapy/Advair/Atrovent/Proventil. Patient with chronic oxygen therapy. Check oxygen saturations every shift  7. Diabetes mellitus. Latest hemoglobin A1c of 6.7. Lantus insulin 15 units daily.will increase Patient with insulin pump prior to admission.  8.  Dysphagia/decreased nutritional storage. Followup speech therapy. Monitor for aspiration. Obtain dietary consult  9. Parkinson's disease. Sinemet 25-100 3 times a day  10. 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 Continue aspirin    11. 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 If pt can be re directed,overall now participating.  I believe pt is more aware of deficits and encouraged him to  con't PT/OT/SLP.  He agreed but will also talk to wife regarding NHP. 12.  Urinary retention due to immobility and poss diabetic cystopathy Foley- D/C d, PVRs cont flomax, I/O cath prn 13.  Diarrhea, resolved       Length of stay, days: 11  Obdulia Steier E ,  06/19/2011, 8:01 AM   06/19/2011, 8:01 AM

## 2011-06-19 NOTE — Progress Notes (Signed)
Speech Language Pathology Daily Session Note  Patient Details  Name: John [name] MRN: [id number] Date of Birth: [birth year]  Today's Date: 06/19/2011 Time: 4098-1191 Time Calculation (min): 40 min  Short Term Goals: Week 2: SLP Short Term Goal 1 (Week 2): Pt will demonstrate sustained attention to a functional task for ~10 minutes with Mod A verbal cues for redirection SLP Short Term Goal 2 (Week 2): Pt will demonstrate orientation to situation, time and place with Mod A contextual and semantic cues. SLP Short Term Goal 3 (Week 2): Pt will demonstrate functional problem solving with familiar tasks with Mod A verbal cues. SLP Short Term Goal 4 (Week 2): Pt will demonstrate safety awareness and will utilize call bell to express wants/needs with Mod A verbal and semantic cues. SLP Short Term Goal 5 (Week 2): Patient will comsume regular textures and thin liquids without oberved s/s of aspiration and moderate assist semantic cues to utilize strategies.  Skilled Therapeutic Interventions: Session focused on skilled treatment of dysphagia and cognition; SLP facilitated session with initial set up of meal and minimal assist to self feed with utensils and check for pocketing; SLP also faciclaited session with max assist semantic cues to solve basic problems with use of telephone and call bell and verbalize orientation with use of external aids.  Daily Session Precautions/Restrictions    FIM:  Comprehension Comprehension Mode: Auditory Comprehension: 2-Understands basic 25 - 49% of the time/requires cueing 51 - 75% of the time Expression Expression Mode: Verbal Expression: 2-Expresses basic 25 - 49% of the time/requires cueing 50 - 75% of the time. Uses single words/gestures. Problem Solving Problem Solving: 2-Solves basic 25 - 49% of the time - needs direction more than half the time to initiate, plan or complete simple activities Memory Memory: 2-Recognizes or recalls 25 - 49% of the  time/requires cueing 51 - 75% of the time FIM - Eating Eating Activity: 4: Helper occasionally scoops food on utensil;4: Helper occasionally brings food to mouth;4: Helper checks for pocketed food General    Pain Pain Assessment Pain Assessment: No/denies pain Pain Score: 0-No pain  Therapy/Group: Individual Therapy  John [name]., CCC-SLP [phone]  John [name] 06/19/2011, 9:09 AM

## 2011-06-19 NOTE — Progress Notes (Signed)
Occupational Therapy Session Note  Patient Details  Name: John [name] MRN: [id number] Date of Birth: 1940/12/03  Today's Date: 06/19/2011 Time: 6213-0865 Time Calculation (min): 47 min   Skilled Therapeutic Interventions/Progress Updates:    Worked on bathing and dressing in supine to sit position.  Had pt work on UB bathing supine with HOB elevated to approximately 25 degrees.  Pt able to wash his arms and abdomen but needed mod instructional cueing for thoroughness.  Performed rolling side to side for peri washing and pulling pants over hips with min facilitation and use of rail.  Transitioned supine to sit EOB with total+2 pt (30%) to donn shirt.  Initially required max assist for sitting balance but progressed to mod assist after 2-3 mins.  Still with frequency to fall posteriorly in sitting especially when attempting to donn pullover shirt.  Performed scooting up to EOB with total +2 pt 20% prior to returning to supine.   Therapy Documentation Precautions:  Precautions Precautions: Back;Fall Precaution Comments: No bending, arching, twisting; on supplemental 02 to maintain sats 97% Restrictions Weight Bearing Restrictions: No  Pain: Pain Assessment Pain Assessment: 0-10 Pain Score:   4 Pain Type: Surgical pain Pain Location: Back Pain Orientation: Lower ADL: See FIM for current functional status  Therapy/Group: Individual Therapy  [name] 06/19/2011, 12:41 PM

## 2011-06-19 NOTE — Progress Notes (Signed)
Per State Regulation 482.30 This chart was reviewed for medical necessity with respect to the patient's Admission/Duration of stay. Pt continues with slow progress. Seroquel decreased due to somnolence. Loose stools resolving.  Meryl Dare                 Nurse Care Manager            Next Review Date: 06/21/11

## 2011-06-20 DIAGNOSIS — Z5189 Encounter for other specified aftercare: Secondary | ICD-10-CM

## 2011-06-20 DIAGNOSIS — IMO0002 Reserved for concepts with insufficient information to code with codable children: Secondary | ICD-10-CM

## 2011-06-20 DIAGNOSIS — R5381 Other malaise: Secondary | ICD-10-CM

## 2011-06-20 LAB — GLUCOSE, CAPILLARY
Glucose-Capillary: 154 mg/dL — ABNORMAL HIGH (ref 70–99)
Glucose-Capillary: 143 mg/dL — ABNORMAL HIGH (ref 70–99)
Glucose-Capillary: 90 mg/dL (ref 70–99)

## 2011-06-20 MED ORDER — INSULIN ASPART 100 UNIT/ML ~~LOC~~ SOLN
6.0000 [IU] | Freq: Every morning | SUBCUTANEOUS | Status: DC
  Administered 2011-06-20 – 2011-06-27 (×8): 6 [IU] via SUBCUTANEOUS

## 2011-06-20 MED ORDER — ALBUTEROL SULFATE (5 MG/ML) 0.5% IN NEBU
2.5000 mg | INHALATION_SOLUTION | Freq: Two times a day (BID) | RESPIRATORY_TRACT | Status: DC
  Administered 2011-06-21: 2.5 mg via RESPIRATORY_TRACT
  Filled 2011-06-20 (×2): qty 0.5

## 2011-06-20 MED ORDER — INSULIN GLARGINE 100 UNIT/ML ~~LOC~~ SOLN
35.0000 [IU] | Freq: Every day | SUBCUTANEOUS | Status: DC
  Administered 2011-06-20 – 2011-06-28 (×9): 35 [IU] via SUBCUTANEOUS

## 2011-06-20 NOTE — Progress Notes (Signed)
Speech Language Pathology Daily Session Note  Patient Details  Name: John [name] MRN: [id number] Date of Birth: Jun 11, 1940  Today's Date: 06/20/2011 Time: 0830-0930 Time Calculation (min): 60 min  Short Term Goals: Week 2: SLP Short Term Goal 1 (Week 2): Pt will demonstrate sustained attention to a functional task for ~10 minutes with Mod A verbal cues for redirection SLP Short Term Goal 2 (Week 2): Pt will demonstrate orientation to situation, time and place with Mod A contextual and semantic cues. SLP Short Term Goal 3 (Week 2): Pt will demonstrate functional problem solving with familiar tasks with Mod A verbal cues. SLP Short Term Goal 4 (Week 2): Pt will demonstrate safety awareness and will utilize call bell to express wants/needs with Mod A verbal and semantic cues. SLP Short Term Goal 5 (Week 2): Patient will comsume regular textures and thin liquids without oberved s/s of aspiration and moderate assist semantic cues to utilize strategies.  Skilled Therapeutic Interventions: Session focused on skilled treatment of dysphagia and cognition; SLP facilitated session with initial set up of meal and minimal assist to self feed with utensils and check for pocketing, which resulted in significant coughing episodes today with no observable reason for functional change in swallow.  SLP provided max assist semantic and demonstrative cues to produce a hard effortful cough; however, cough response overall weak.  SLP notified RN who set up suctioning, after suctioning patient's clear phlegm respiratory was paged to provide a breathing treatment.  SLP recommended continuing curret diet and liquid because episode suspected to be due to secretions and phlegm not as a result of aspiration.  SLP also faciclaited session with max assist semantic cues to solve basic problems with use of telephone and call bell and verbalize orientation with use of external aids.  Daily Session Precautions/Restrictions    FIM:  Comprehension Comprehension Mode: Auditory Comprehension: 3-Understands basic 50 - 74% of the time/requires cueing 25 - 50%  of the time Expression Expression Mode: Verbal Expression: 3-Expresses basic 50 - 74% of the time/requires cueing 25 - 50% of the time. Needs to repeat parts of sentences. Social Interaction Social Interaction: 4-Interacts appropriately 75 - 89% of the time - Needs redirection for appropriate language or to initiate interaction. Problem Solving Problem Solving: 3-Solves basic 50 - 74% of the time/requires cueing 25 - 49% of the time Memory Memory: 2-Recognizes or recalls 25 - 49% of the time/requires cueing 51 - 75% of the time FIM - Eating Eating Activity: 4: Helper occasionally scoops food on utensil;4: Helper occasionally brings food to mouth;4: Helper checks for pocketed food General    Pain Pain Assessment Pain Assessment: No/denies pain Pain Score: 0-No pain  Therapy/Group: Individual Therapy  Charlane Ferretti., CCC-SLP [phone]  Analysia Dungee 06/20/2011, 11:29 AM

## 2011-06-20 NOTE — Progress Notes (Signed)
Social Work Patient ID: John [name], male   DOB: February 02, 1941, 71 y.o.   MRN: [id number] Met with wife who was going to therapies with pt.  She reports she can see some progress and is encouraged By this.  She reports pt is feeling better and this is a plus also.  Unsure if wife will be able to provide the care pt Will require at discharge, depending upon how much that is.  Will discuss tomorrow at team conference.

## 2011-06-20 NOTE — Progress Notes (Signed)
Patient ID: John [name], male   DOB: 11/13/1940, 71 y.o.   MRN: [id number] John [name] is a 71 y.o. male C/o "diarrhea"- nsg reports 3 soft stools a day Subjective: No loose stools Somnolent this am  No c/os   Objective: Vital signs in last 24 hours: Temp:  [97.7 F (36.5 C)-97.9 F (36.6 C)] 97.9 F (36.6 C) (04/30 0600) Pulse Rate:  [85-89] 85  (04/30 0600) Resp:  [20] 20  (04/30 0600) BP: (102-160)/(76-77) 160/77 mmHg (04/30 0600) SpO2:  [94 %-99 %] 94 % (04/30 0600) Weight change:  Last BM Date: 06/19/11  Intake/Output from previous day: 04/29 0701 - 04/30 0700 In: 840 [P.O.:840] Out: 1425 [Urine:1425] Last cbgs: CBG (last 3)   Basename 06/20/11 0711 06/19/11 2102 06/19/11 1635  GLUCAP 154* 216* 119*     Physical Exam Gen NAD Mood/affect approp Cor:  RRR no Murmur Lungs:  Clear Abd:  +BS soft NT Skin:  Excoriation perianal mild/mod Ext:  No C/C/E Motors: 5/5 in UEs, 4/5 in LEs except 3-3/5 L ankle, poor truncal control. Intentional tremor Mood/affect- no agitation, follows commands,   Lab Results: Results for orders placed during the hospital encounter of 06/08/11 (from the past 24 hour(s))  BASIC METABOLIC PANEL     Status: Abnormal   Collection Time   06/19/11 10:10 AM      Component Value Range   Sodium 134 (*) 135 - 145 (mEq/L)   Potassium 4.0  3.5 - 5.1 (mEq/L)   Chloride 97  96 - 112 (mEq/L)   CO2 29  19 - 32 (mEq/L)   Glucose, Bld 286 (*) 70 - 99 (mg/dL)   BUN 14  6 - 23 (mg/dL)   Creatinine, Ser 1.47  0.50 - 1.35 (mg/dL)   Calcium 9.6  8.4 - 82.9 (mg/dL)   GFR calc non Af Amer 86 (*) >90 (mL/min)   GFR calc Af Amer >90  >90 (mL/min)  CBC     Status: Abnormal   Collection Time   06/19/11 10:10 AM      Component Value Range   WBC 7.5  4.0 - 10.5 (K/uL)   RBC 4.17 (*) 4.22 - 5.81 (MIL/uL)   Hemoglobin 9.4 (*) 13.0 - 17.0 (g/dL)   HCT 56.2 (*) 13.0 - 52.0 (%)   MCV 72.9 (*) 78.0 - 100.0 (fL)   MCH 22.5 (*) 26.0 - 34.0 (pg)   MCHC  30.9  30.0 - 36.0 (g/dL)   RDW 86.5 (*) 78.4 - 15.5 (%)   Platelets 148 (*) 150 - 400 (K/uL)  DIFFERENTIAL     Status: Normal   Collection Time   06/19/11 10:10 AM      Component Value Range   Neutrophils Relative 65  43 - 77 (%)   Lymphocytes Relative 25  12 - 46 (%)   Monocytes Relative 7  3 - 12 (%)   Eosinophils Relative 3  0 - 5 (%)   Basophils Relative 0  0 - 1 (%)   Neutro Abs 4.9  1.7 - 7.7 (K/uL)   Lymphs Abs 1.9  0.7 - 4.0 (K/uL)   Monocytes Absolute 0.5  0.1 - 1.0 (K/uL)   Eosinophils Absolute 0.2  0.0 - 0.7 (K/uL)   Basophils Absolute 0.0  0.0 - 0.1 (K/uL)   RBC Morphology POLYCHROMASIA PRESENT    GLUCOSE, CAPILLARY     Status: Abnormal   Collection Time   06/19/11 11:53 AM      Component Value Range  Glucose-Capillary 281 (*) 70 - 99 (mg/dL)  GLUCOSE, CAPILLARY     Status: Abnormal   Collection Time   06/19/11  4:35 PM      Component Value Range   Glucose-Capillary 119 (*) 70 - 99 (mg/dL)  GLUCOSE, CAPILLARY     Status: Abnormal   Collection Time   06/19/11  9:02 PM      Component Value Range   Glucose-Capillary 216 (*) 70 - 99 (mg/dL)   Comment 1 Notify RN    GLUCOSE, CAPILLARY     Status: Abnormal   Collection Time   06/20/11  7:11 AM      Component Value Range   Glucose-Capillary 154 (*) 70 - 99 (mg/dL)   Comment 1 Notify RN       Studies/Results: No results found.   Assessment: 1. Functional deficits secondary to Lumbar radiculopathy s/p L5-S1 decompression with muliple post op complications which require15 hrs per week of interdisciplinary therapy in a comprehensive inpatient rehab setting.  Physiatrist is providing close team supervision and 24 hour management of active medical problems listed below.  Physiatrist and rehab team continue to assess barriers to discharge/monitor patient progress toward functional and medical goals. Not participating well in therapy for several reasons.  Oriented to place and situation   Plan: 1. lumbar HNP with  radiculopathy. Status post laminotomies and facetotomies with postoperative respiratory failure and anoxia  2. Mood/depression/anxiety. Reduce Seroquel due to somnolence, cont Lamictal and vilazodone. Check sleep chart. Discuss cognitive baseline with family3. DVT Prophylaxis/Anticoagulation: SCDs. Monitor for deep vein thrombosis  4. Pain Management:oxycodone IR 5 mg every 6 hours as needed pain  5. History of renal transplant 2002. Followup renal services as needed.  Latest creatinine reviewed Continue CellCept, Prograf and prednisone 5 mg daily  6. COPD/status post thoracentesis 06/01/2011. Followup pulmonary services as needed. Continue respiratory therapy/Advair/Atrovent/Proventil. Patient with chronic oxygen therapy. Check oxygen saturations every shift  7. Diabetes mellitus. Latest hemoglobin A1c of 6.7. Lantus insulin 15 units daily.will increase Patient with insulin pump prior to admission. Increase Lantus but D/c noon and supper novalog standing orders 8. Dysphagia/decreased nutritional storage. Followup speech therapy. Monitor for aspiration. Obtain dietary consult  9. Parkinson's disease. Sinemet 25-100 3 times a day  10. 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 Continue aspirin    11. 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 If pt can be re directed,overall now participating.  I believe pt is more aware of deficits and encouraged him to con't PT/OT/SLP.  He agreed but will also talk to wife regarding NHP. 12.  Urinary retention due to immobility and poss diabetic cystopathy Foley- D/C d, PVRs cont flomax, I/O cath prn 13.  Diarrhea, actually loose stools will d/c Mg and check level       Length of stay, days: 12  Faiga Stones E ,  06/20/2011, 7:37 AM   06/20/2011, 7:37 AM

## 2011-06-20 NOTE — Progress Notes (Signed)
Physical Therapy Session Note  Patient Details  Name: KWAMAINE CUPPETT MRN: [id number] Date of Birth: 16-Feb-1941  Today's Date: 06/20/2011 Time: 4098-1191 Time Calculation (min): 54 min  Short Term Goals: Week 1:  PT Short Term Goal 1 (Week 1): Patient will perform bed mobility with mod A while recalling 2/3 back precautions PT Short Term Goal 1 - Progress (Week 1): Revised due to lack of progress PT Short Term Goal 2 (Week 1): Patient will tolerate sitting EOB x 20-30 minutes with improved trunk posture for functional activities PT Short Term Goal 2 - Progress (Week 1): Revised due to lack of progress PT Short Term Goal 3 (Week 1): Patient will transfer bed <> w/c with mod A with attention to back precautions PT Short Term Goal 3 - Progress (Week 1): Revised due to lack of progress PT Short Term Goal 4 (Week 1): Patient will perform w/c mobility x 150 on unit with mod A for endurance training PT Short Term Goal 4 - Progress (Week 1): Revised due to lack of progress PT Short Term Goal 5 (Week 1): Patient will tolerate 5 minutes of dynamic standing balance, endurance and pre-gait training PT Short Term Goal 5 - Progress (Week 1): Revised due to lack of progress Week 2:  PT Short Term Goal 1 (Week 2): Patient will perform rolling in bed and sup <> sit with max A and 50-75% verbal cues for attention to task and sequencing PT Short Term Goal 2 (Week 2): Patient will tolerate 5-10 minutes unsupported sitting EOB and 15-20 minutes supported sitting in chair to engage in trunk control, UE and LE strengthening activities PT Short Term Goal 3 (Week 2): Patient will tolerate 5-10 minutes total A supported standing (lift equipment) for pressure relief, WB through bilat LE and trunk control training  PT Short Term Goal 4 (Week 2): Patient will assist 25% with bed <> w/c transfers without use of lift equipment  Therapy Documentation Precautions:  Precautions Precautions: Back;Fall Precaution Comments:  No bending, arching, twisting; on supplemental 02 to maintain sats >92% Restrictions Weight Bearing Restrictions: No Vital Signs: Therapy Vitals Pulse Rate: 80  Oxygen Therapy SpO2: 90 % O2 Device: None (Room air) Pulse Oximetry Type: Intermittent Pain: Pain Assessment Pain Assessment: No/denies pain Mobility:  Patient already up in w/c; transported to gym; attempted to get patient set up in standing frame but patient reporting an incontinent BM; returned to room and transferred w/c > bed with Beasy board and +2A for safety with patient performing 30-40% anterior lean and pushing across board with UE.  Patient performed sit > supine, bed mobility, rolling and bridging multiple times in the bed with +2A for safety and patient performing 30-40% during doffing and donning of shorts, changing of brief and hygiene.  Repositioned in bed with pillows under bilat LE to elevate heels off the bed.  See FIM for current functional status  Therapy/Group: Individual Therapy  Edman Circle [facility] 06/20/2011, 3:04 PM

## 2011-06-20 NOTE — Progress Notes (Signed)
Patient has been referred to [facility] Care Management by PCP.  We will engage the patient at discharge.For any additional questions or new referrals please contact Anibal Henderson BSN RN [facility] Liaison at [phone].

## 2011-06-20 NOTE — Plan of Care (Signed)
Problem: RH BLADDER ELIMINATION Goal: RH STG MANAGE BLADDER WITH ASSISTANCE STG Manage Bladder With max Assistance  Outcome: Not Progressing Pt has no urge to void. I&O cath q6 hrs

## 2011-06-20 NOTE — Progress Notes (Signed)
Occupational Therapy Session Note  Patient Details  Name: John [name] MRN: [id number] Date of Birth: 04-08-1940  Today's Date: 06/20/2011 Time: 1108-1205 Time Calculation (min): 57 min  Short Term Goals: Week 2:  OT Short Term Goal 1 (Week 2): Pt will complete UB bathing with min assist in sitting position OT Short Term Goal 2 (Week 2): Pt will complete LB bathing with mod assist OT Short Term Goal 3 (Week 2): Pt will complete LB dressing with mod assist OT Short Term Goal 4 (Week 2): Pt will complete stand pivot transfer to [facility] with max assist  Skilled Therapeutic Interventions/Progress Updates:    Pt performed LB bathing supine in bed rolling side to side with total assist including peri area.  Pt able to roll with mod facilitation and max instructional cues to sequence bending up knees and reaching for the hospital rail.  Transitioned to sitting EOB.  Pt able to perform static sitting with min assist initially but needs mod as he fatigues.  Pt able to perform UB bathing and bath his upper legs with mod assist for balance but only setup for the actual bathing task.  Also donned pullover shirt with with min assist to pull over his stomach.  Performed sit to stand so therapist could pull his shorts over his hips with total +2 (pt 25%) from elevated bed.  After sitting EOB with and without support for approximately 10 mins pt requested to return to bed.  Persuaded pt to stay up for lunch and performed stand pivot transfer to tilt in space chair with total +2(pt 20%).  O2 sats checked without supplemental oxygen at 90-91 %.  Therapy Documentation Precautions:  Precautions Precautions: Back;Fall Precaution Comments: No bending, arching, twisting; on supplemental 02 to maintain sats >92% Restrictions Weight Bearing Restrictions: No  Vital Signs: Therapy Vitals Pulse Rate: 80  Oxygen Therapy SpO2: 90 % O2 Device: None (Room air) Pulse Oximetry Type: Intermittent Pain: Pain  Assessment Pain Assessment: Faces Pain Score: 0-No pain Faces Pain Scale: Hurts little more Pain Type: Acute pain Pain Location: Leg Pain Orientation: Right;Left Pain Intervention(s): Medication (See eMAR);Repositioned Multiple Pain Sites: Yes 2nd Pain Site Pain Score: 4 Pain Type: Acute pain Pain Location: Back Pain Intervention(s): Repositioned;Medication (See eMAR) ADL: See FIM for current functional status  Therapy/Group: Individual Therapy  October Peery 06/20/2011, 2:04 PM

## 2011-06-20 NOTE — Progress Notes (Signed)
Inpatient Diabetes Program Recommendations  AACE/ADA: New Consensus Statement on Inpatient Glycemic Control (2009)  Target Ranges:  Prepandial:   less than 140 mg/dL      Peak postprandial:   less than 180 mg/dL (1-2 hours)      Critically ill patients:  140 - 180 mg/dL   Reason for Visit: Results for EAN, GETTEL (MRN [id number]) as of 06/20/2011 10:33  Ref. Range 06/19/2011 11:53 06/19/2011 16:35 06/19/2011 21:02 06/20/2011 07:11 06/20/2011 10:06  Glucose-Capillary Latest Range: 70-99 mg/dL 914 (H) 782 (H) 956 (H) 154 (H) 225 (H)   Note CBG's were looking better with Novolog meal coverage.  Agree with increasing AM dose of Novolog meal coverage to 6 units (since lunch CBG's have been greater than goal).  This dose however needs to be given with breakfast in order for the insulin to properly cover CHO intake.  Patient also needs meal coverage with lunch to cover carbohydrate intake so therefore would continue Novolog 4 units with lunch and supper (to be held if patient does not eat).  Note Lantus also increased to 35 units q HS.  Will follow

## 2011-06-21 LAB — GLUCOSE, CAPILLARY
Glucose-Capillary: 186 mg/dL — ABNORMAL HIGH (ref 70–99)
Glucose-Capillary: 167 mg/dL — ABNORMAL HIGH (ref 70–99)

## 2011-06-21 MED ORDER — BETHANECHOL CHLORIDE 25 MG PO TABS
25.0000 mg | ORAL_TABLET | Freq: Three times a day (TID) | ORAL | Status: DC
  Administered 2011-06-21 – 2011-06-22 (×5): 25 mg via ORAL
  Filled 2011-06-21 (×9): qty 1

## 2011-06-21 MED ORDER — ALBUTEROL SULFATE (5 MG/ML) 0.5% IN NEBU
2.5000 mg | INHALATION_SOLUTION | Freq: Four times a day (QID) | RESPIRATORY_TRACT | Status: DC
  Administered 2011-06-21 – 2011-06-26 (×19): 2.5 mg via RESPIRATORY_TRACT
  Filled 2011-06-21 (×20): qty 0.5

## 2011-06-21 NOTE — Progress Notes (Signed)
Patient ID: John [name], male   DOB: July 29, 1940, 72 y.o.   MRN: [id number] John [name] is a 71 y.o. male C/o "diarrhea"- nsg reports 3 soft stools a day Subjective: No loose stools Somnolent this am  No c/os   Objective: Vital signs in last 24 hours: Temp:  [97.9 F (36.6 C)-98.1 F (36.7 C)] 98.1 F (36.7 C) (05/01 0449) Pulse Rate:  [69-88] 69  (05/01 0449) Resp:  [18-22] 18  (05/01 0449) BP: (109-150)/(69-80) 109/69 mmHg (05/01 0449) SpO2:  [90 %-99 %] 99 % (05/01 0824) Weight change:  Last BM Date: 06/20/11  Intake/Output from previous day: 04/30 0701 - 05/01 0700 In: 960 [P.O.:960] Out: 1650 [Urine:1650] Last cbgs: CBG (last 3)   Basename 06/21/11 0724 06/20/11 2118 06/20/11 1633  GLUCAP 116* 90 84     Physical Exam Gen NAD Mood/affect approp Cor:  RRR no Murmur Lungs:  Clear Abd:  +BS soft NT Skin:  Excoriation perianal mild/mod Ext:  No C/C/E Motors: 5/5 in UEs, 4/5 in LEs except 3-3/5 L ankle, poor truncal control. Intentional tremor Mood/affect- no agitation, follows commands,   Lab Results: Results for orders placed during the hospital encounter of 06/08/11 (from the past 24 hour(s))  GLUCOSE, CAPILLARY     Status: Abnormal   Collection Time   06/20/11 10:06 AM      Component Value Range   Glucose-Capillary 225 (*) 70 - 99 (mg/dL)  GLUCOSE, CAPILLARY     Status: Abnormal   Collection Time   06/20/11 11:49 AM      Component Value Range   Glucose-Capillary 143 (*) 70 - 99 (mg/dL)   Comment 1 Notify RN    GLUCOSE, CAPILLARY     Status: Normal   Collection Time   06/20/11  4:33 PM      Component Value Range   Glucose-Capillary 84  70 - 99 (mg/dL)   Comment 1 Notify RN    GLUCOSE, CAPILLARY     Status: Normal   Collection Time   06/20/11  9:18 PM      Component Value Range   Glucose-Capillary 90  70 - 99 (mg/dL)  GLUCOSE, CAPILLARY     Status: Abnormal   Collection Time   06/21/11  7:24 AM      Component Value Range   Glucose-Capillary  116 (*) 70 - 99 (mg/dL)   Comment 1 Notify RN       Studies/Results: No results found.   Assessment: 1. Functional deficits secondary to Lumbar radiculopathy s/p L5-S1 decompression with muliple post op complications which require15 hrs per week of interdisciplinary therapy in a comprehensive inpatient rehab setting.  Physiatrist is providing close team supervision and 24 hour management of active medical problems listed below.  Physiatrist and rehab team continue to assess barriers to discharge/monitor patient progress toward functional and medical goals. Not participating well in therapy for several reasons.  Oriented to place and situation   Plan: 1. lumbar HNP with radiculopathy. Status post laminotomies and facetotomies with postoperative respiratory failure and anoxia  2. Mood/depression/anxiety. Reduce Seroquel due to somnolence, cont Lamictal and vilazodone. Check sleep chart. Discuss cognitive baseline with family3. DVT Prophylaxis/Anticoagulation: SCDs. Monitor for deep vein thrombosis  4. Pain Management:oxycodone IR 5 mg every 6 hours as needed pain  5. History of renal transplant 2002. Followup renal services as needed.  Latest creatinine reviewed Continue CellCept, Prograf and prednisone 5 mg daily  6. COPD/status post thoracentesis 06/01/2011. Followup pulmonary services as needed. Continue respiratory  therapy/Advair/Atrovent/Proventil. Patient with chronic oxygen therapy. Check oxygen saturations every shift  7. Diabetes mellitus. Latest hemoglobin A1c of 6.7. Lantus insulin 15 units daily.will increase Patient with insulin pump prior to admission. Increase Lantus but D/c noon and supper novalog standing orders 8. Dysphagia/decreased nutritional storage. Followup speech therapy. Monitor for aspiration. Obtain dietary consult  9. Parkinson's disease. Sinemet 25-100 3 times a day  10. 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 Continue aspirin    11. Agitation- probable anoxic encephalopathy  following resp failure post op, worsened by UTI- ? If pt can be re directed,overall now participating.  I believe pt is more aware of deficits and encouraged him to con't PT/OT/SLP.  . 12.  Urinary retention due to immobility and poss diabetic cystopathy Foley- D/C d, PVRs cont flomax, I/O cath prn 13.  Diarrhea, actually loose stools will d/c Mg and check level       Length of stay, days: 13  John [name] E ,  06/21/2011, 9:51 AM   06/21/2011, 9:51 AM

## 2011-06-21 NOTE — Progress Notes (Signed)
Speech Language Pathology Daily Session Note  Patient Details  Name: John [name] MRN: [id number] Date of Birth: 02/08/1941  Today's Date: 06/21/2011 Time: 0830-0910 Time Calculation (min): 40 min  Short Term Goals: Week 2: SLP Short Term Goal 1 (Week 2): Pt will demonstrate sustained attention to a functional task for ~10 minutes with Mod A verbal cues for redirection SLP Short Term Goal 2 (Week 2): Pt will demonstrate orientation to situation, time and place with Mod A contextual and semantic cues. SLP Short Term Goal 3 (Week 2): Pt will demonstrate functional problem solving with familiar tasks with Mod A verbal cues. SLP Short Term Goal 4 (Week 2): Pt will demonstrate safety awareness and will utilize call bell to express wants/needs with Mod A verbal and semantic cues. SLP Short Term Goal 5 (Week 2): Patient will comsume regular textures and thin liquids without oberved s/s of aspiration and moderate assist semantic cues to utilize strategies.  Skilled Therapeutic Interventions: Session focused on skilled treatment of dysphagia and cognition.  SLP facilitated session with total assist to perform oral care and mod-max assist semantic cues to initiate and sustain attention to self feeding for 2-3 minutes with patient attempting to hand items back to SLP who that suggested he eat some.  Patient consumed 50% of items with moderate assist semantic to for portion and rate control with no observed s/s of aspiration with Dys.3 textures and thin liquids.   Daily Session Precautions/Restrictions    FIM:  Comprehension Comprehension Mode: Auditory Comprehension: 3-Understands basic 50 - 74% of the time/requires cueing 25 - 50%  of the time Expression Expression Mode: Verbal Expression: 3-Expresses basic 50 - 74% of the time/requires cueing 25 - 50% of the time. Needs to repeat parts of sentences. Social Interaction Social Interaction: 4-Interacts appropriately 75 - 89% of the time - Needs  redirection for appropriate language or to initiate interaction. Problem Solving Problem Solving: 2-Solves basic 25 - 49% of the time - needs direction more than half the time to initiate, plan or complete simple activities Memory Memory: 2-Recognizes or recalls 25 - 49% of the time/requires cueing 51 - 75% of the time FIM - Eating Eating Activity: 4: Helper checks for pocketed food General    Pain Pain Assessment Pain Assessment: No/denies pain Pain Score: 0-No pain  Therapy/Group: Individual Therapy  Charlane Ferretti., CCC-SLP [phone]  Forrest Jaroszewski 06/21/2011, 9:19 AM

## 2011-06-21 NOTE — Care Management Note (Signed)
Met with pt's wife to report on team conference. Wife requesting team to reconsider d/c date -- to allow more time due to pt losing therapy time when sick with UTI and diarrhea. Wife tearful and wants to care for pt at home, does not want to consider SNF. Assured her that this CM would consult with the team and MD.

## 2011-06-21 NOTE — Patient Care Conference (Signed)
Inpatient RehabilitationTeam Conference Note Date: 06/21/2011   Time: 10:40 AM    Patient Name: John [name]      Medical Record Number: [id number]  Date of Birth: 18-Aug-1940 Sex: Male         Room/Bed: 4035/4035-01 Payor Info: Payor: MEDICARE  Plan: MEDICARE PART A AND B  Product Type: *No Product type*     Admitting Diagnosis: VDRF, lumbar lami,renal transplant  Admit Date/Time:  06/08/2011  2:34 PM Admission Comments: No comment available   Primary Diagnosis:  Physical deconditioning Principal Problem: Physical deconditioning  Patient Active Problem List  Diagnoses Date Noted  . Physical deconditioning 06/08/2011  . Delirium due to general medical condition 05/30/2011  . 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 Acute respiratory failure 05/22/2011  . Pulmonary edema 05/22/2011  . Diastolic CHF, acute on chronic 05/19/2011  . DM (diabetes mellitus), type 2 05/19/2011  . Insulin pump in place 05/19/2011  . Spondylosis 05/16/2011  . Herniated nucleus pulposus 05/16/2011  . Back pain 04/23/2011  . First degree AV block 12/01/2010  . Dyspnea 08/29/2010  . COPD (chronic obstructive pulmonary disease) 08/18/2010  . Chronic kidney disease   . Carotid stenosis 08/11/2010  . Nonspecific abnormal unspecified cardiovascular function study 07/04/2010  . LEG PAIN, RIGHT 01/25/2010  . PLEURAL EFFUSION, RIGHT 01/06/2010  . INTERMITTENT CLAUDICATION, BILATERAL 12/27/2009  . PURE HYPERCHOLESTEROLEMIA 11/16/2009  . CORONARY ATHEROSLERO UNSPEC TYPE BYPASS GRAFT 11/16/2009  . PVD WITH CLAUDICATION 11/01/2009  . HYPERLIPIDEMIA-MIXED 06/24/2009  . Atrial fibrillation 05/26/2009  . Chronic diastolic heart failure 05/26/2009  . Orthostatic hypotension 05/26/2009  . Coronary atherosclerosis of native coronary artery 05/20/2009  . PERICARDIAL EFFUSION 05/20/2009    Expected Discharge Date: Expected Discharge Date: 06/28/11  Team Members Present: Physician: Dr. Claudette Laws Case Manager Present: Lutricia Horsfall, RN Social Worker Present: Dossie Der, LCSW Nurse Present: Gregor Hams, RN PT Present: Edman Circle, PT OT Present: Bretta Bang, OT, Perrin Maltese, OT Tora Duck, RN, PPS Coordinator Fae Pippin, SLP    Current Status/Progress Goal Weekly Team Focus  Medical   Agitation improve, Urinary retention  Manage Bladder without catheterization  Adjust bladder meds   Bowel/Bladder   Urinary retention, requiring In and Out caths every 6 hours, incontinent of bowel, depends. total assist of staff  Continent of Bowel and bladder with toileting, voiding on his own min assist of caregiver      Swallow/Nutrition/ Hydration   Dys.3 textures and thin liquds with full staff supervision and fluctuating gains on self feeding  least restrictive p.o. intake  trials of regular textures   ADL's   UB bathing and dressing min assist in supported sitting.  Total +2 pt (20%) assist for LB bathing and dressing sit to stand.  Needs max encouragement to maintain sitting up during selfcare tasks.  total assis for toileting at bed level.  supervision for UB selfcare and min assist for LB selfcare and toileting.  endurance, selfcare independence, attention, sitting and standing balance and tolerance for ADLs   Mobility   max A bed mobility, +2A bed <> w/c transfers with SARA or sliding board (patient assists 30-40%), OOB 1 hour in tilt in space w/c  min A overall   OOB activity tolerance, tranfers, sitting balance and postural control, UE and LE strength   Communication   mod-min assist  min assist  increase self monitoring    Safety/Cognition/ Behavioral Observations  mod-max  minimal assist  increase awareness, initiation and problem solving  Pain   no complaint of pain, has prn medication ordered if needed  3 or less      Skin   redness to buttocks and groin, left buttocks with denuded area, EPBC, turn every 2 hrs and prn, meatus pink  no breakdown          *See Interdisciplinary Assessment and Plan and progress notes for long and short-term goals  Barriers to Discharge: Pt with poor activity tolerance    Possible Resolutions to Barriers:  Explore SNF as post acute option    Discharge Planning/Teaching Needs:  Wife continues to plan to take pt home. Pt's progress seems to be very slow, unsure if wife will be able to handle him at home.      Team Discussion:  Pt c/o buttocks pain, leg pain. Less confused. Making slow gains. 2+ sit to stand. Ordered scheduled resp treatments. Requiring I&O caths. Question wife's ability to provide care at home.  Revisions to Treatment Plan:  none   Continued Need for Acute Rehabilitation Level of Care: The patient requires daily medical management by a physician with specialized training in physical medicine and rehabilitation for the following conditions: Daily direction of a multidisciplinary physical rehabilitation program to ensure safe treatment while eliciting the highest outcome that is of practical value to the patient.: Yes Daily medical management of patient stability for increased activity during participation in an intensive rehabilitation regime.: Yes Daily analysis of laboratory values and/or radiology reports with any subsequent need for medication adjustment of medical intervention for : Neurological problems  Meryl Dare 06/23/2011, 8:50 AM

## 2011-06-21 NOTE — Progress Notes (Signed)
Per State Regulation 482.30 This chart was reviewed for medical necessity with respect to the patient's Admission/Duration of stay. [name], [name]                 Nurse Care Manager            Next Review Date: 06/26/11   

## 2011-06-21 NOTE — Progress Notes (Signed)
Occupational Therapy Session Note  Patient Details  Name: John [name] MRN: [id number] Date of Birth: 11/08/1940  Today's Date: 06/21/2011 Time: 1123-1205 Time Calculation (min): 42 min  Short Term Goals: Week 2:  OT Short Term Goal 1 (Week 2): Pt will complete UB bathing with min assist in sitting position OT Short Term Goal 2 (Week 2): Pt will complete LB bathing with mod assist OT Short Term Goal 3 (Week 2): Pt will complete LB dressing with mod assist OT Short Term Goal 4 (Week 2): Pt will complete stand pivot transfer to [facility] with max assist  Skilled Therapeutic Interventions/Progress Updates:    Performed bathing and dressing session supine to sit EOB.  Pt able to roll in bed side to side with mod instructional cues and min assist to both sides.  Needs cueing to pull up his legs and reach for the rail on each side.  Transitioned to sitting from sidelying with mod facilitation.  Needs min guard assist for initial static sitting.  Progressed to needing max assist while attempting to donn pullover shirt and when washing UB.  Total +2 (pt 25%) for sit to stand to pull pants over hips and for stand pivot transfer to wheelchair.   Pt tolerated sitting EOB for 7-8 mins during session.  Still not oriented to place or time.  Therapy Documentation Precautions:  Precautions Precautions: Back;Fall Precaution Comments: No bending, arching, twisting; on supplemental 02 to maintain sats >92% Restrictions Weight Bearing Restrictions: No  Pain: Pain Assessment Pain Assessment: No/denies pain Pain Score: 0-No pain ADL: See FIM for current functional status  Therapy/Group: Individual Therapy  Penina Reisner 06/21/2011, 12:30 PM

## 2011-06-21 NOTE — Progress Notes (Signed)
Physical Therapy Session Note  Patient Details  Name: John [name] MRN: [id number] Date of Birth: [birth year]  Today's Date: 06/21/2011 Time: 7829-5621 Time Calculation (min): 55 min  Short Term Goals: Week 1:  PT Short Term Goal 1 (Week 1): Patient will perform bed mobility with mod A while recalling 2/3 back precautions PT Short Term Goal 1 - Progress (Week 1): Revised due to lack of progress PT Short Term Goal 2 (Week 1): Patient will tolerate sitting EOB x 20-30 minutes with improved trunk posture for functional activities PT Short Term Goal 2 - Progress (Week 1): Revised due to lack of progress PT Short Term Goal 3 (Week 1): Patient will transfer bed <> w/c with mod A with attention to back precautions PT Short Term Goal 3 - Progress (Week 1): Revised due to lack of progress PT Short Term Goal 4 (Week 1): Patient will perform w/c mobility x 150 on unit with mod A for endurance training PT Short Term Goal 4 - Progress (Week 1): Revised due to lack of progress PT Short Term Goal 5 (Week 1): Patient will tolerate 5 minutes of dynamic standing balance, endurance and pre-gait training PT Short Term Goal 5 - Progress (Week 1): Revised due to lack of progress Week 2:  PT Short Term Goal 1 (Week 2): Patient will perform rolling in bed and sup <> sit with max A and 50-75% verbal cues for attention to task and sequencing PT Short Term Goal 2 (Week 2): Patient will tolerate 5-10 minutes unsupported sitting EOB and 15-20 minutes supported sitting in chair to engage in trunk control, UE and LE strengthening activities PT Short Term Goal 3 (Week 2): Patient will tolerate 5-10 minutes total A supported standing (lift equipment) for pressure relief, WB through bilat LE and trunk control training  PT Short Term Goal 4 (Week 2): Patient will assist 25% with bed <> w/c transfers without use of lift equipment  Therapy Documentation Precautions:  Precautions Precautions: Back;Fall Precaution Comments:  No bending, arching, twisting; on supplemental 02 to maintain sats >92% Restrictions Weight Bearing Restrictions: No Vital Signs: Therapy Vitals Temp: 98 F (36.7 C) Temp src: Oral Pulse Rate: 86  Resp: 18  BP: 115/64 mmHg Patient Position, if appropriate: Sitting Oxygen Therapy SpO2: 100 % O2 Device: Nasal cannula O2 Flow Rate (L/min): 2 L/min Pain: Pain Assessment Pain Assessment: No/denies pain Other Treatments: Treatments Therapeutic Activity: Assisted patient with donning shoes in bed; performed supine > sit EOB with HOB raised and use of rail with max A and verbal cues for initiation and sequence.  Transferred patient to w/c with Beasy board and max A for anterior lean, UE placement and use of LE and UE extension to slide across board.  Set patient up in standing frame and performed 3 reps sit to stand with total A to focus on WB through bilat LE, activaiton of LE and trunk extensor muscles and lateral weight shifting; patient unable to maintain standing >30 seconds and unable to hold trunk in upright position.  Returned to w/c and performed trunk and postural control exercises of neck retraction, scapular retraction and LE strengthening with bilat LE pillow squeezes and theraband resisted hip ABD in upright position in w/c.    Encouraged patient to remain up in w/c 30 extra minutes to continue postural control strengthening against gravity.  See FIM for current functional status  Therapy/Group: Individual Therapy  Edman Circle [facility] 06/21/2011, 4:50 PM

## 2011-06-21 NOTE — Progress Notes (Signed)
Nutrition Follow-up  Intake 15 - 35%. Having soft stools, recently had loose stools. Pt reports that he does not like the foods here. Trying to drink less Glucerna as he thinks it might be causing diarrhea. Discussed meal variety to attempt better PO intake.  Diet Order:  Dysphagia 3 with thin liquids Supplements: Ensure Pudding TID, Glucerna Shakes BID  Meds: Scheduled Meds:   . albuterol  2.5 mg Nebulization QID  . antiseptic oral rinse  1 application Mouth Rinse BID  . aspirin  81 mg Per Tube Daily  . bethanechol  25 mg Oral TID  . calcium carbonate  2 tablet Per Tube Daily  . carbidopa-levodopa  1 tablet Per Tube TID  . clopidogrel  75 mg Oral Q breakfast  . ezetimibe  10 mg Oral Daily  . feeding supplement  1 Container Oral TID BM  . feeding supplement  237 mL Oral BID BM  . ferrous sulfate  325 mg Oral Q breakfast  . folic acid  1 mg Oral Daily  . furosemide  80 mg Oral Daily  . insulin aspart  0-20 Units Subcutaneous TID WC  . insulin aspart  6 Units Subcutaneous q morning - 10a  . insulin glargine  35 Units Subcutaneous Daily  . isosorbide mononitrate  60 mg Oral Daily  . lamoTRIgine  200 mg Oral Daily  . mycophenolate  500 mg Oral BID  . pantoprazole  40 mg Oral Q1200  . predniSONE  5 mg Oral Q breakfast  . pyridostigmine  60 mg Oral TID  . QUEtiapine  25 mg Oral BID  . saccharomyces boulardii  500 mg Oral BID  . tacrolimus  5 mg Oral BID  . Tamsulosin HCl  0.4 mg Oral Daily  . Vilazodone HCl  20 mg Oral Daily  . DISCONTD: albuterol  2.5 mg Nebulization BID   Continuous Infusions:  PRN Meds:.acetaminophen, alum & mag hydroxide-simeth, diphenoxylate-atropine, ondansetron (ZOFRAN) IV, ondansetron, oxyCODONE, sodium chloride, sorbitol, DISCONTD: albuterol  Labs:  CMP     Component Value Date/Time   NA 134* 06/19/2011 1010   K 4.0 06/19/2011 1010   CL 97 06/19/2011 1010   CO2 29 06/19/2011 1010   GLUCOSE 286* 06/19/2011 1010   BUN 14 06/19/2011 1010   CREATININE 0.83  06/19/2011 1010   CALCIUM 9.6 06/19/2011 1010   PROT 6.2 06/13/2011 1255   ALBUMIN 1.9* 06/13/2011 1255   AST 11 06/13/2011 1255   ALT 7 06/13/2011 1255   ALKPHOS 120* 06/13/2011 1255   BILITOT 0.7 06/13/2011 1255   GFRNONAA 86* 06/19/2011 1010   GFRAA >90 06/19/2011 1010   CBG (last 3)   Basename 06/21/11 1142 06/21/11 0724 06/20/11 2118  GLUCAP 186* 116* 90     Intake/Output Summary (Last 24 hours) at 06/21/11 1157 Last data filed at 06/21/11 0906  Gross per 24 hour  Intake   1080 ml  Output   1825 ml  Net   -745 ml   Weight Status:  93.9 kg (4/24) - no new wt  Estimated needs:  2150 - 2350 kcal, 120 - 130 grams protein  Nutrition Dx:  Inadequate oral intake r/t poor appetite, severe deconditioning, fatigue AEB poor PO intake x several days and decreasing weight trend. Continues  Goal:  Pt to consume >/= 50% of meals and supplements.  Intervention:   1. Encourage a variety of meals. 2. If intake does not improve, recommend short-term enteral nutrition via enteral feeding tube.  Continuous regimen recommendation (to meet  100% of nutrition needs: Initiate Glucerna 1.2 formula at 15 ml/hr, advance by 10 ml/hr every 8 hours, or as tolerated, to goal of 85 ml/hr x 2 hours (anticipate TF to be held for 4 hours daily for participation in therapies). 30 ml Prostat via tube BID. TF regimen will provide: 2184 kcal, 132 grams protein, 1369 ml free water. Will meet 100% of minimum estimated kcal and protein needs.  Nocturnal regimen recommendation (to meet 50% of nutrition needs): Initiate Glucerna 1.2 formula at 15 ml/hr, advance by 10 ml/hr every 8 hours, or as tolerated, to goal of 75 ml/hr x 12 hours. 30 ml Prostat via tube daily. TF regimen will provide: 1152 kcal, 69 grams, 725 ml free water. Will meet 54% of minimum estimated kcal needs and 58% of minimum estimated protein needs.  Monitor:  Weights, labs, I/O's, PO intake  Adair Laundry Pager #:  [phone]

## 2011-06-22 DIAGNOSIS — Z5189 Encounter for other specified aftercare: Secondary | ICD-10-CM

## 2011-06-22 DIAGNOSIS — R5381 Other malaise: Secondary | ICD-10-CM

## 2011-06-22 DIAGNOSIS — IMO0002 Reserved for concepts with insufficient information to code with codable children: Secondary | ICD-10-CM

## 2011-06-22 LAB — GLUCOSE, CAPILLARY
Glucose-Capillary: 135 mg/dL — ABNORMAL HIGH (ref 70–99)
Glucose-Capillary: 192 mg/dL — ABNORMAL HIGH (ref 70–99)

## 2011-06-22 MED ORDER — TAMSULOSIN HCL 0.4 MG PO CAPS
0.8000 mg | ORAL_CAPSULE | Freq: Every day | ORAL | Status: DC
  Administered 2011-06-22: 0.8 mg via ORAL
  Filled 2011-06-22 (×3): qty 2

## 2011-06-22 NOTE — Progress Notes (Signed)
Occupational Therapy Session Note  Patient Details  Name: John [name] MRN: [id number] Date of Birth: 1940/12/30  Today's Date: 06/22/2011 Time: 1103-1200 Time Calculation (min): 57 min  Short Term Goals: Week 2:  OT Short Term Goal 1 (Week 2): Pt will complete UB bathing with min assist in sitting position OT Short Term Goal 2 (Week 2): Pt will complete LB bathing with mod assist OT Short Term Goal 3 (Week 2): Pt will complete LB dressing with mod assist OT Short Term Goal 4 (Week 2): Pt will complete stand pivot transfer to [facility] with max assist  Skilled Therapeutic Interventions/Progress Updates:    Pt seen for bathing and dressing with wife present.  Pt more alert today but still not oriented to place or time.  O2 sats 90-91% on room air during session.  Pt able to roll in bed to both sides with mod instructional cueing and supervision while therapist assisted with peri care.  Transitioned to sitting from sidelying with min assist and use of rail.  Pt participated in UB bathing and dressing EOB with min assist for balance. Able to sit with min assist for 6-7 mins before fatiguing and needing max assist secondary to frequent LOB to the left and posteriorly.  Sat total EOB for 18 mins.  Total +2 pt 25% for sit to stand from the EOB to pull pants over his hips.    Therapy Documentation Precautions:  Precautions Precautions: Back;Fall Precaution Comments: No bending, arching, twisting; on supplemental 02 to maintain sats >92% Restrictions Weight Bearing Restrictions: No General:   Vital Signs: Oxygen Therapy SpO2: 91 % O2 Device: None (Room air) Pain: Pain Assessment Pain Assessment: No/denies pain Pain Score: 0-No pain ADL:  See FIM for current functional status  Therapy/Group: Individual Therapy  Cambre Matson 06/22/2011, 12:57 PM

## 2011-06-22 NOTE — Progress Notes (Signed)
Speech Language Pathology Daily Session Note  Patient Details  Name: John [name] MRN: [id number] Date of Birth: 1940/09/03  Today's Date: 06/22/2011 Time: 4098-1191 Time Calculation (min): 45 min  Short Term Goals: Week 2: SLP Short Term Goal 1 (Week 2): Pt will demonstrate sustained attention to a functional task for ~10 minutes with Mod A verbal cues for redirection SLP Short Term Goal 2 (Week 2): Pt will demonstrate orientation to situation, time and place with Mod A contextual and semantic cues. SLP Short Term Goal 3 (Week 2): Pt will demonstrate functional problem solving with familiar tasks with Mod A verbal cues. SLP Short Term Goal 4 (Week 2): Pt will demonstrate safety awareness and will utilize call bell to express wants/needs with Mod A verbal and semantic cues. SLP Short Term Goal 5 (Week 2): Patient will comsume regular textures and thin liquids without oberved s/s of aspiration and moderate assist semantic cues to utilize strategies.  Skilled Therapeutic Interventions: Session focused on patient self feeding Dys.3 textures and thin liquids via cup with moderate assist semantic cues to initiate set up and minimal assist semantic cues to self feed with moderate pace and portion size.  Overall, supervision assist semantic cues to self feed for 10 minutes.  Patient was able to recall hospital stay and new updated information form the doctor with min-mod assist semantic cues.  SLP also facilitated session with mod assist semantic cues and minimal assist tactile cues to follow directions during bed mobility task.    Daily Session Precautions/Restrictions    FIM:  Comprehension Comprehension Mode: Auditory Comprehension: 3-Understands basic 50 - 74% of the time/requires cueing 25 - 50%  of the time Expression Expression Mode: Verbal Expression: 3-Expresses basic 50 - 74% of the time/requires cueing 25 - 50% of the time. Needs to repeat parts of sentences. Social  Interaction Social Interaction: 3-Interacts appropriately 50 - 74% of the time - May be physically or verbally inappropriate. Problem Solving Problem Solving: 2-Solves basic 25 - 49% of the time - needs direction more than half the time to initiate, plan or complete simple activities Memory Memory: 3-Recognizes or recalls 50 - 74% of the time/requires cueing 25 - 49% of the time FIM - Eating Eating Activity: 5: Needs verbal cues/supervision General    Pain Pain Assessment Pain Assessment: No/denies pain Pain Score: 0-No pain  Therapy/Group: Individual Therapy  Charlane Ferretti., CCC-SLP [phone]  Graysen Depaula 06/22/2011, 10:31 AM

## 2011-06-22 NOTE — Plan of Care (Signed)
Problem: RH SKIN INTEGRITY Goal: RH STG MAINTAIN SKIN INTEGRITY WITH ASSISTANCE STG Maintain Skin Integrity With minimal Assistance.  Outcome: Not Progressing Max-Total assist of staff

## 2011-06-22 NOTE — Progress Notes (Signed)
Patient ID: John [name], male   DOB: 09-15-1940, 71 y.o.   MRN: [id number] John [name] is a 71 y.o. male Remembers SNF conversation from yesterday   Objective: Vital signs in last 24 hours: Temp:  [98 F (36.7 C)-98.3 F (36.8 C)] 98.3 F (36.8 C) (05/02 0511) Pulse Rate:  [86-89] 89  (05/02 0511) Resp:  [18] 18  (05/02 0511) BP: (115-164)/(64-75) 164/75 mmHg (05/02 0511) SpO2:  [97 %-100 %] 100 % (05/02 0511) Weight change:  Last BM Date: 06/21/11  Intake/Output from previous day: 05/01 0701 - 05/02 0700 In: 600 [P.O.:600] Out: 725 [Urine:725] Last cbgs: CBG (last 3)   Basename 06/22/11 0723 06/21/11 2046 06/21/11 1651  GLUCAP 135* 167* 163*     Physical Exam Gen NAD Mood/affect approp Cor:  RRR no Murmur Lungs:  Clear Abd:  +BS soft NT Skin:  Excoriation perianal mild/mod Ext:  No C/C/E Motors: 5/5 in UEs, 4/5 in LEs except 3-3/5 L ankle and L quad, poor truncal control. Intentional tremor Mood/affect- no agitation, follows commands,   Lab Results: Results for orders placed during the hospital encounter of 06/08/11 (from the past 24 hour(s))  MAGNESIUM     Status: Abnormal   Collection Time   06/21/11 10:00 AM      Component Value Range   Magnesium 1.2 (*) 1.5 - 2.5 (mg/dL)  GLUCOSE, CAPILLARY     Status: Abnormal   Collection Time   06/21/11 11:42 AM      Component Value Range   Glucose-Capillary 186 (*) 70 - 99 (mg/dL)   Comment 1 Notify RN    GLUCOSE, CAPILLARY     Status: Abnormal   Collection Time   06/21/11  4:51 PM      Component Value Range   Glucose-Capillary 163 (*) 70 - 99 (mg/dL)   Comment 1 Notify RN    GLUCOSE, CAPILLARY     Status: Abnormal   Collection Time   06/21/11  8:46 PM      Component Value Range   Glucose-Capillary 167 (*) 70 - 99 (mg/dL)   Comment 1 Notify RN    GLUCOSE, CAPILLARY     Status: Abnormal   Collection Time   06/22/11  7:23 AM      Component Value Range   Glucose-Capillary 135 (*) 70 - 99 (mg/dL)   Comment 1  Notify RN       Studies/Results: No results found.   Assessment: 1. Functional deficits secondary to Lumbar radiculopathy s/p L5-S1 decompression with muliple post op complications which require15 hrs per week of interdisciplinary therapy in a comprehensive inpatient rehab setting.  Physiatrist is providing close team supervision and 24 hour management of active medical problems listed below.  Physiatrist and rehab team continue to assess barriers to discharge/monitor patient progress toward functional and medical goals. Not participating well in therapy for several reasons.  Oriented to place and situation   Plan: 1. lumbar HNP with radiculopathy. Status post laminotomies and facetotomies with postoperative respiratory failure and anoxia  2. Mood/depression/anxiety. Reduce Seroquel due to somnolence, cont Lamictal and vilazodone. Check sleep chart. 3. DVT Prophylaxis/Anticoagulation: SCDs. Monitor for deep vein thrombosis  4. Pain Management:oxycodone IR 5 mg every 6 hours as needed pain  5. History of renal transplant 2002. Followup renal services as needed.  Latest creatinine reviewed Continue CellCept, Prograf and prednisone 5 mg daily  6. COPD/status post thoracentesis 06/01/2011. Followup pulmonary services as needed. Continue respiratory therapy/Advair/Atrovent/Proventil. Patient with chronic oxygen therapy. Check oxygen  saturations every shift  7. Diabetes mellitus. Latest hemoglobin A1c of 6.7. Lantus insulin 15 units daily.will increase Patient with insulin pump prior to admission. Increase Lantus but D/c noon and supper novalog standing orders 8. Dysphagia/decreased nutritional storage. Followup speech therapy. Monitor for aspiration. Obtain dietary consult  9. Parkinson's disease. Sinemet 25-100 3 times a day  10. 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 Continue aspirin    11. 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 If pt can be re  directed,overall now participating.  I believe pt is more aware of deficits and encouraged him to con't PT/OT/SLP.  . 12.  Urinary retention due to immobility and poss diabetic cystopathy Foley- D/C d, PVRs cont flomax, I/O cath prn 13.  Diarrhea improved,  Mg level low restart,monitor for recurrence      Length of stay, days: 14  Cole Eastridge E ,  06/22/2011, 7:33 AM   06/22/2011, 7:33 AM

## 2011-06-22 NOTE — Plan of Care (Signed)
Problem: RH BLADDER ELIMINATION Goal: RH STG MANAGE BLADDER WITH ASSISTANCE STG Manage Bladder With max Assistance  Outcome: Not Progressing Pt requiring In and Out Cath every 5 hrs and prn, started on urecholine and Flomax

## 2011-06-22 NOTE — Progress Notes (Signed)
Physical Therapy Weekly Progress Note  Patient Details  Name: John [name] MRN: [id number] Date of Birth: Mar 10, 1940  Today's Date: 06/22/2011 Time: 1300-1400 Time Calculation (min): 60 min  Patient continues to make slow but steady progress toward PT LTG and has met 3 of 4 short term goals.  Patient now requires mod-max A for log rolling and supine <> sit with extra time, min-mod A for static sitting balance in unsupported sitting for 3-5 minutes at a time, max A slideboard transfer to tilt in space w/c, tolerates 1 hour up in tilt in space w/c, but still requires total A for static standing in SARA or standing frame for postural control training.  Patient is showing improved initiation and use of bilat LE but is still unable to hold trunk in upright position in standing.   Patient continues to demonstrate the following deficits: bilat UE, LE and trunk weakness, impaired postural and trunk control, significantly impaired activity tolerance and endurance, impaired attention to task, impaired sitting and standing balance and is still unable to maintain standing or perform ambulation without total A of lift equipment and therefore will continue to benefit from skilled PT intervention to enhance overall performance with activity tolerance, balance, postural control, functional use of  right upper extremity, right lower extremity, left upper extremity and left lower extremity and attention, and functional transfers.  Patient progressing toward long term goals..  Continue plan of care.  PT Short Term Goals Week 1:  PT Short Term Goal 1 (Week 1): Patient will perform bed mobility with mod A while recalling 2/3 back precautions PT Short Term Goal 1 - Progress (Week 1): Revised due to lack of progress PT Short Term Goal 2 (Week 1): Patient will tolerate sitting EOB x 20-30 minutes with improved trunk posture for functional activities PT Short Term Goal 2 - Progress (Week 1): Revised due to lack of  progress PT Short Term Goal 3 (Week 1): Patient will transfer bed <> w/c with mod A with attention to back precautions PT Short Term Goal 3 - Progress (Week 1): Revised due to lack of progress PT Short Term Goal 4 (Week 1): Patient will perform w/c mobility x 150 on unit with mod A for endurance training PT Short Term Goal 4 - Progress (Week 1): Revised due to lack of progress PT Short Term Goal 5 (Week 1): Patient will tolerate 5 minutes of dynamic standing balance, endurance and pre-gait training PT Short Term Goal 5 - Progress (Week 1): Revised due to lack of progress Week 2:  PT Short Term Goal 1 (Week 2): Patient will perform rolling in bed and sup <> sit with max A and 50-75% verbal cues for attention to task and sequencing PT Short Term Goal 1 - Progress (Week 2): Met PT Short Term Goal 2 (Week 2): Patient will tolerate 5-10 minutes unsupported sitting EOB and 15-20 minutes supported sitting in chair to engage in trunk control, UE and LE strengthening activities PT Short Term Goal 2 - Progress (Week 2): Met PT Short Term Goal 3 (Week 2): Patient will tolerate 5-10 minutes total A supported standing (lift equipment) for pressure relief, WB through bilat LE and trunk control training  PT Short Term Goal 3 - Progress (Week 2): Progressing toward goal PT Short Term Goal 4 (Week 2): Patient will assist 25% with bed <> w/c transfers without use of lift equipment PT Short Term Goal 4 - Progress (Week 2): Met Week 3:  PT Short Term Goal 1 (  Week 3): Patient will perform bed mobility with log roll and transfers bed <> w/c with mod A PT Short Term Goal 2 (Week 3): Patient will tolerate 5-10 minutes total A supported standing (lift equipment) for pressure relief, WB through bilat LE and trunk control training PT Short Term Goal 3 (Week 3): Patient will tolerate 2 hours OOB in w/c and 20-30 minutes unsupported sitting for trunk control training PT Short Term Goal 4 (Week 3): Patient will progress to high  back manual w/c and begin w/c mobility training x 50' with mod A  Therapy Documentation Precautions:  Precautions Precautions: Back;Fall Precaution Comments: No bending, arching, twisting; on supplemental 02 to maintain sats >92% Restrictions Weight Bearing Restrictions: No Vital Signs: Therapy Vitals Temp: 98 F (36.7 C) Temp src: Oral Pulse Rate: 85  Resp: 18  BP: 111/54 mmHg Patient Position, if appropriate: Lying Oxygen Therapy SpO2: 100 % O2 Device: Nasal cannula O2 Flow Rate (L/min): 2 L/min Pain: Pain Assessment Pain Assessment: 0-10 Pain Score:   5 Pain Type: Acute pain Pain Location: Buttocks Pain Descriptors: Aching Pain Frequency: Intermittent Pain Onset: Gradual Patients Stated Pain Goal: 3 Pain Intervention(s): RN aware Multiple Pain Sites: No Exercises:  While sitting upright in w/c performed 2 minutes forward and 2 minutes backward UE ergometer on tall table to facilitate shoulder flexion to 90 deg for UE strengthening and endurance with one rest break for medication Other Treatments:   Patient reporting needing to have BM; performed w/c <> BSC transfers squat pivot to L and R with max-total A and multiple sit > squat from toilet with prolonged squat for hygiene and clothing management with patient performing 15-20% hip and knee extension; performed prolonged sitting on toilet without back support; still unable to maintain upright head and trunk See FIM for current functional status  Therapy/Group: Individual Therapy  Edman Circle [facility] 06/22/2011, 4:31 PM

## 2011-06-22 NOTE — Plan of Care (Signed)
Problem: RH BOWEL ELIMINATION Goal: RH STG MANAGE BOWEL WITH ASSISTANCE STG Manage Bowel with max Assistance.  Outcome: Not Progressing Pt some time un-aware that he had a BM

## 2011-06-22 NOTE — Progress Notes (Signed)
Pt with no void in 6 hrs, in and out cath for 150cc  Tea colored urine, notified Dan Angiulli PA of results, no new orders given at this time, will encourage po fluids and continue to monitor. [place]on[place], Campbell Agramonte Elon Jester

## 2011-06-22 NOTE — Plan of Care (Signed)
Problem: RH BOWEL ELIMINATION Goal: RH STG MANAGE BOWEL WITH ASSISTANCE STG Manage Bowel with max Assistance.  Outcome: Not Progressing Pt incontinent of stool today, total assist of staff

## 2011-06-22 NOTE — Plan of Care (Signed)
Problem: RH SKIN INTEGRITY Goal: RH STG SKIN FREE OF INFECTION/BREAKDOWN Pt will remain free of any new skin breakdown  Outcome: Not Progressing Pt with partial thickness to Left coccyx/upper buttocks( fleshy part),superficial, top layer of skin gone with 5% slough 95% red, alleyvn placed. Turn q 2 and prn

## 2011-06-23 LAB — GLUCOSE, CAPILLARY
Glucose-Capillary: 126 mg/dL — ABNORMAL HIGH (ref 70–99)
Glucose-Capillary: 93 mg/dL (ref 70–99)
Glucose-Capillary: 182 mg/dL — ABNORMAL HIGH (ref 70–99)

## 2011-06-23 LAB — CBC
HCT: 32.2 % — ABNORMAL LOW (ref 39.0–52.0)
Hemoglobin: 10.1 g/dL — ABNORMAL LOW (ref 13.0–17.0)
MCHC: 31.4 g/dL (ref 30.0–36.0)
MCV: 72.2 fL — ABNORMAL LOW (ref 78.0–100.0)
RDW: 21.3 % — ABNORMAL HIGH (ref 11.5–15.5)

## 2011-06-23 NOTE — Progress Notes (Signed)
Social Work Patient ID: John [name], male   DOB: 07-17-1940, 71 y.o.   MRN: [id number] Met with wife yesterday to discuss discharge plans.  She has discussed with pt and both feel short term NHP Is the best option for them at this time.  Private duty care is not an option, due to cost.  Wife to visit area NH's and Get back with this worker regarding her choices.  Aware pt may be difficult to place due to multiple diagnoses and long list of meds For these diagnoses.  Pt is agreeable but really would rather stay here to receive his rehab.  Will begin NH search.

## 2011-06-23 NOTE — Progress Notes (Signed)
Physical Therapy Session Note  Patient Details  Name: John [name] MRN: [id number] Date of Birth: December 08, 1940  Today's Date: 06/23/2011 Time: 1304-1400 Time Calculation (min): 56 min  Short Term Goals: Week 1:  PT Short Term Goal 1 (Week 1): Patient will perform bed mobility with mod A while recalling 2/3 back precautions PT Short Term Goal 1 - Progress (Week 1): Revised due to lack of progress PT Short Term Goal 2 (Week 1): Patient will tolerate sitting EOB x 20-30 minutes with improved trunk posture for functional activities PT Short Term Goal 2 - Progress (Week 1): Revised due to lack of progress PT Short Term Goal 3 (Week 1): Patient will transfer bed <> w/c with mod A with attention to back precautions PT Short Term Goal 3 - Progress (Week 1): Revised due to lack of progress PT Short Term Goal 4 (Week 1): Patient will perform w/c mobility x 150 on unit with mod A for endurance training PT Short Term Goal 4 - Progress (Week 1): Revised due to lack of progress PT Short Term Goal 5 (Week 1): Patient will tolerate 5 minutes of dynamic standing balance, endurance and pre-gait training PT Short Term Goal 5 - Progress (Week 1): Revised due to lack of progress Week 2:  PT Short Term Goal 1 (Week 2): Patient will perform rolling in bed and sup <> sit with max A and 50-75% verbal cues for attention to task and sequencing PT Short Term Goal 1 - Progress (Week 2): Met PT Short Term Goal 2 (Week 2): Patient will tolerate 5-10 minutes unsupported sitting EOB and 15-20 minutes supported sitting in chair to engage in trunk control, UE and LE strengthening activities PT Short Term Goal 2 - Progress (Week 2): Met PT Short Term Goal 3 (Week 2): Patient will tolerate 5-10 minutes total A supported standing (lift equipment) for pressure relief, WB through bilat LE and trunk control training  PT Short Term Goal 3 - Progress (Week 2): Progressing toward goal PT Short Term Goal 4 (Week 2): Patient will  assist 25% with bed <> w/c transfers without use of lift equipment PT Short Term Goal 4 - Progress (Week 2): Met Week 3:  PT Short Term Goal 1 (Week 3): Patient will perform bed mobility with log roll and transfers bed <> w/c with mod A PT Short Term Goal 2 (Week 3): Patient will tolerate 5-10 minutes total A supported standing (lift equipment) for pressure relief, WB through bilat LE and trunk control training PT Short Term Goal 3 (Week 3): Patient will tolerate 2 hours OOB in w/c and 20-30 minutes unsupported sitting for trunk control training PT Short Term Goal 4 (Week 3): Patient will progress to high back manual w/c and begin w/c mobility training x 50' with mod A  Therapy Documentation Precautions:  Precautions Precautions: Fall;Back Precaution Comments: No bending, arching, twisting; on supplemental 02 to maintain sats >92% Restrictions Weight Bearing Restrictions: No Vital Signs: Oxygen Therapy O2 Device: Nasal cannula O2 Flow Rate (L/min): 2 L/min Pain: Pain Assessment Pain Assessment: Faces Pain Score:   6 Faces Pain Scale: Hurts even more Pain Type: Chronic pain Pain Location: Back Pain Orientation: Lower Pain Descriptors: Aching Pain Frequency: Intermittent Pain Onset: With Activity Pain Intervention(s): RN made aware Multiple Pain Sites: No Other Treatments: Treatments Therapeutic Activity: Patient in w/c; transfers mat <> w/c and w/c > bed with beasy board and max A with patient assisting with UE and LE; sitting unsupported on mat performed trunk  control training with UE support on swiss ball during anterior and posterior leans without use of UE and min-mod A; placed swiss ball in small of patient's low back to encourage thoracic extension during lateral reaching and weight shifts with trunk elongation and shortening; multiple rest breaks in reclined position with focus on stretching anterior chest/pecs; returned to bed and supine with +2A  See FIM for current  functional status  Therapy/Group: Individual Therapy  Edman Circle [facility] 06/23/2011, 4:41 PM

## 2011-06-23 NOTE — Progress Notes (Signed)
Speech Language Pathology Daily Session Note & Weekly progress update  Patient Details  Name: John [name] MRN: [id number] Date of Birth: [birth year]  Today's Date: 06/23/2011 Time: 0900-0915 Time Calculation (min): 15 min  Short Term Goals: Week 2: SLP Short Term Goal 1 (Week 2): Pt will demonstrate sustained attention to a functional task for ~10 minutes with Mod A verbal cues for redirection SLP Short Term Goal 2 (Week 2): Pt will demonstrate orientation to situation, time and place with Mod A contextual and semantic cues. SLP Short Term Goal 3 (Week 2): Pt will demonstrate functional problem solving with familiar tasks with Mod A verbal cues. SLP Short Term Goal 4 (Week 2): Pt will demonstrate safety awareness and will utilize call bell to express wants/needs with Mod A verbal and semantic cues. SLP Short Term Goal 5 (Week 2): Patient will comsume regular textures and thin liquids without oberved s/s of aspiration and moderate assist semantic cues to utilize strategies.  Skilled Therapeutic Interventions: Session focused on self feeding with Dys.3 textures and thin liquids; patient appeared frustrated and reported being very uncomfortable sitting on his bottom.  Refused p.o. As a result SLP repositioned with supervision semantic cues to follow directions during bed mobility.  After patient was comfortable on side laying down in bed he stated tat he was ready to eat; SLP educated both patient and wife on need for sitting at 90 degrees while eating to reduce aspiration risk; patient required moderate assist cues to demonstrate safety awareness.   Daily Session Precautions/Restrictions    FIM:  Comprehension Comprehension Mode: Auditory Comprehension: 3-Understands basic 50 - 74% of the time/requires cueing 25 - 50%  of the time Expression Expression Mode: Verbal Expression: 4-Expresses basic 75 - 89% of the time/requires cueing 10 - 24% of the time. Needs helper to occlude trach/needs  to repeat words. Social Interaction Social Interaction: 3-Interacts appropriately 50 - 74% of the time - May be physically or verbally inappropriate. Problem Solving Problem Solving: 2-Solves basic 25 - 49% of the time - needs direction more than half the time to initiate, plan or complete simple activities Memory Memory: 3-Recognizes or recalls 50 - 74% of the time/requires cueing 25 - 49% of the time FIM - Eating Eating Activity: 5: Supervision/cues General    Pain Pain Assessment Pain Assessment: 0-10 Pain Score:   6 Pain Type: Acute pain Pain Location: Buttocks Pain Descriptors: Aching Pain Onset: Gradual Patients Stated Pain Goal: 3 Pain Intervention(s): RN made aware;Other (Comment);Repositioned (pre medicated) Multiple Pain Sites: No  Therapy/Group: Individual Therapy   Speech Language Pathology Weekly Progress Note  Patient Details  Name: John [name] MRN: [id number] Date of Birth: 24-Dec-1940  Today's Date: 06/23/2011  Short Term Goals: Short Term Goals: Week 2: SLP Short Term Goal 1 (Week 2): Pt will demonstrate sustained attention to a functional task for ~10 minutes with Mod A verbal cues for redirection SLP Short Term Goal 1 - Progress (Week 2): Met SLP Short Term Goal 2 (Week 2): Pt will demonstrate orientation to situation, time and place with Mod A contextual and semantic cues. SLP Short Term Goal 2 - Progress (Week 2): Met SLP Short Term Goal 3 (Week 2): Pt will demonstrate functional problem solving with familiar tasks with Mod A verbal cues. SLP Short Term Goal 3 - Progress (Week 2): Met SLP Short Term Goal 4 (Week 2): Pt will demonstrate safety awareness and will utilize call bell to express wants/needs with Mod A verbal and semantic cues.  SLP Short Term Goal 4 - Progress (Week 2): Met SLP Short Term Goal 5 (Week 2): Patient will comsume regular textures and thin liquids without oberved s/s of aspiration and moderate assist semantic cues to utilize  strategies. SLP Short Term Goal 5 - Progress (Week 2): Progressing toward goal Week 3: SLP Short Term Goal 1 (Week 3): Pt will demonstrate sustained attention to a functional task for 15 minutes with Mod A verbal cues for redirection SLP Short Term Goal 2 (Week 3): Pt will demonstrate functional problem solving with familiar tasks with Min A verbal cues. SLP Short Term Goal 3 (Week 3): Patient will comsume regular textures and thin liquids without oberved s/s of aspiration and moderate assist semantic cues to utilize strategies.  Weekly Progress Updates: Patient met 4 out of 5 short term goals this reporting period with gains in orientation and use of call bell; SLP was unable to assess upgrade to regular textures his week due to patient's willingness to participate.  Overall patient with moderate assist cues for cognitive-linguistic tasks and repetition appears to be helping really every days tasks, but patient would continue to benefit from skilled SLP services inc continue to increase functional independence.     Daily Session Cognition: Overall Cognitive Status: Impaired Arousal/Alertness: Awake/alert Orientation Level: Oriented to person;Oriented to place;Oriented to situation;Disoriented to time Focused Attention: Appears intact Sustained Attention: Impaired Sustained Attention Impairment: Verbal basic;Functional basic Memory: Impaired Memory Impairment: Storage deficit;Retrieval deficit;Decreased recall of new information Awareness: Impaired Awareness Impairment: Emergent impairment Problem Solving: Impaired Problem Solving Impairment: Verbal basic;Functional basic Executive Function: Reasoning;Initiating;Self Monitoring;Self Correcting;Organizing;Decision Making Reasoning: Impaired Organizing: Impaired Decision Making: Impaired Initiating: Impaired Self Monitoring: Impaired Self Correcting: Impaired Behaviors: Poor frustration tolerance Safety/Judgment: Impaired Oral/Motor:  Oral Motor/Sensory Function Overall Oral Motor/Sensory Function: Appears within functional limits for tasks assessed Labial ROM: Reduced right;Reduced left Labial Symmetry: Within Functional Limits Labial Strength: Reduced Labial Sensation: Within Functional Limits Lingual ROM: Reduced right;Reduced left Lingual Symmetry: Within Functional Limits Lingual Strength: Reduced Lingual Sensation: Within Functional Limits Facial ROM: Reduced right Motor Speech Overall Motor Speech: Appears within functional limits for tasks assessed Motor Planning: Witnin functional limits Comprehension: Auditory Comprehension Overall Auditory Comprehension: Impaired Yes/No Questions: Within Functional Limits Complex Questions: 75-100% accurate Commands: Impaired Two Step Basic Commands: 75-100% accurate Multistep Basic Commands: 50-74% accurate Conversation: Simple Interfering Components: Attention;Processing speed;Working Radio broadcast assistant: Dietitian: Within [employer] Reading Comprehension Reading Status: Not tested Expression: Expression Primary Mode of Expression: Verbal Verbal Expression Overall Verbal Expression: Impaired Initiation: No impairment Level of Generative/Spontaneous Verbalization: Sentence;Conversation Repetition: No impairment Naming: No impairment Other Naming Comments: high level word finding and naming errors in sentence-converastion level expression Pragmatics: Impairment Impairments: Topic maintenance;Turn Taking Interfering Components: Attention Effective Techniques: Semantic cues Written Expression Dominant Hand: Right Written Expression: Not tested  Charlane Ferretti., CCC-SLP [phone]  Tian Mcmurtrey 06/23/2011, 9:39 AM

## 2011-06-23 NOTE — Progress Notes (Signed)
Occupational Therapy Weekly Progress Note  Patient Details  Name: John [name] MRN: [id number] Date of Birth: 04-16-1940  Today's Date: 06/23/2011 Time: 1100-1157 Time Calculation (min): 57 min  Patient has met 1 of 4 short term goals.  Has demonstrated some improvement with sitting tolerance and balance but still variable.  Needs min assist initially in sitting and after about 7 mins  Needs max assist to balance.  Total+2 (pt 25%) for sit to stand and to maintain standing balance for more than 2-3 seconds.  Unabe to achieve full standing.  Also still with increased confusion requiring mod instructional cues for sequencing and thoroughness during bathing.      Patient continues to demonstrate the following deficits: decreased balance, decreased strength, decreased awareness, decreased activity tolerance for all selfcare tasks and therefore will continue to benefit from skilled OT intervention to enhance overall performance with BADL.  Patient not progressing toward long term goals.  See goal revision..  Plan of care revisions:  pt planning to transfer to SNF for further rehab..  OT Short Term Goals Week 3:  OT Short Term Goal 1 (Week 3): Pt will maintain sitting EOB with no more than min assist during bathing. OT Short Term Goal 2 (Week 3): Pt will perfrom LB bathing sit to stand with max assist. OT Short Term Goal 3 (Week 3): Pt will perform toilet transfer to 3:1 with max stand/squat pivot. OT Short Term Goal 4 (Week 3): Pt will comb his hair with setup and no verbal cueing for thoroughness.  Skilled Therapeutic Interventions/Progress Updates:    Worked on bathing and dressing sit to stand at the EOB.  Pt able to sit with min assist for the first 3-4 mins then needed mod assist, while performing UB bathing.  After a few minutes then needed max assist for balance while donning pullover shirt.  Performed sit to stand X 4 during session with bed elevated.  Pt needing total+2 (pt 25%) to  perform.  Initially stood for approximately 45 seconds while tech assisted with perianal hygiene.  Unable to stand fully upright or achieve full knee extension.  Demonstrates moderate trunk and head flexion as well with standing.  Transferred to wheelchair with total assist stand pivot to perform grooming task at the sink.  Pt needed mod instructional cues and min assist to complete combing his hair.  Wife present and observed throughout session.  Therapy Documentation Precautions:  Precautions Precautions: Fall;Back Precaution Comments: No bending, arching, twisting; on supplemental 02 to maintain sats >92% Restrictions Weight Bearing Restrictions: No  Vital Signs: Therapy Vitals Patient Position, if appropriate: Sitting Oxygen Therapy SpO2: 92 % O2 Device: None (Room air) Pain: Pain Assessment Pain Assessment: Faces Faces Pain Scale: Hurts even more Pain Type: Chronic pain Pain Location: Back Pain Orientation: Mid Pain Descriptors: Aching Pain Frequency: Intermittent Multiple Pain Sites: No ADL: ADL Eating: Set up Where Assessed-Eating: Wheelchair Grooming: Minimal assistance;Moderate cueing Where Assessed-Grooming: Sitting at sink;Wheelchair Upper Body Bathing: Supervision/safety;Moderate cueing Where Assessed-Upper Body Bathing: Edge of bed;Other (Comment) (supported sitting) Lower Body Bathing: Dependent (total +2 Pt 25 % for sit to stand) Where Assessed-Lower Body Bathing: Edge of bed Upper Body Dressing: Minimal cueing;Minimal assistance Where Assessed-Upper Body Dressing: Edge of bed;Other (Comment) (supported sitting) Lower Body Dressing: Other (Comment);Dependent (total +2 Pt 25 % for sit to stand) Where Assessed-Lower Body Dressing: Bed level Toileting: Other (Comment) Where Assessed-Toileting:  (total +2 Pt 25 % for sit to stand) Toilet Transfer: Dependent Toilet Transfer Method: Stand pivot  Acupuncturist: Gaffer: Not  assessed Film/video editor: Not assessed ADL Comments: Pt tolerating sitting EOB better but still needs total +2 assistance for sit to stand and to maintain standing for just a few seconds during LB selfcare tasks. Still needs mod instructional cueing during bathing for increased thoroughness.   See FIM for current functional status  Therapy/Group: Individual Therapy  Rajanae Mantia 06/23/2011, 2:06 PM

## 2011-06-23 NOTE — Progress Notes (Signed)
Patient ID: John [name], male   DOB: 1941/02/04, 71 y.o.   MRN: [id number] John [name] is a 71 y.o. male  Subjective: C/o feeling the need for catheterization became upset at RN because it wasn't time yet   Objective: Vital signs in last 24 hours: Temp:  [98 F (36.7 C)-98.2 F (36.8 C)] 98.2 F (36.8 C) (05/03 0445) Pulse Rate:  [85-87] 87  (05/03 0445) Resp:  [18] 18  (05/03 0445) BP: (111-146)/(54-76) 146/76 mmHg (05/03 0445) SpO2:  [91 %-100 %] 95 % (05/03 0445) Weight change:  Last BM Date: 06/22/11  Intake/Output from previous day: 05/02 0701 - 05/03 0700 In: 720 [P.O.:720] Out: 975 [Urine:975] Last cbgs: CBG (last 3)   Basename 06/22/11 2029 06/22/11 1620 06/22/11 1152  GLUCAP 189* 165* 192*     Physical Exam Gen NAD Mood/affect approp Cor:  RRR no Murmur Lungs:  Clear Abd:  +BS soft NT Skin:  Excoriation perianal mild/mod Ext:  No C/C/E Motors: 5/5 in UEs, 4/5 in LEs except 3-3/5 L ankle, poor truncal control. Intentional tremor Mood/affect- no agitation, follows commands,   Lab Results: Results for orders placed during the hospital encounter of 06/08/11 (from the past 24 hour(s))  GLUCOSE, CAPILLARY     Status: Abnormal   Collection Time   06/22/11 11:52 AM      Component Value Range   Glucose-Capillary 192 (*) 70 - 99 (mg/dL)   Comment 1 Notify RN    GLUCOSE, CAPILLARY     Status: Abnormal   Collection Time   06/22/11  4:20 PM      Component Value Range   Glucose-Capillary 165 (*) 70 - 99 (mg/dL)   Comment 1 Notify RN    GLUCOSE, CAPILLARY     Status: Abnormal   Collection Time   06/22/11  8:29 PM      Component Value Range   Glucose-Capillary 189 (*) 70 - 99 (mg/dL)   Comment 1 Notify RN    MAGNESIUM     Status: Normal   Collection Time   06/23/11  5:33 AM      Component Value Range   Magnesium 1.5  1.5 - 2.5 (mg/dL)     Studies/Results: No results found.   Assessment: 1. Functional deficits secondary to Lumbar radiculopathy s/p  L5-S1 decompression with muliple post op complications which require15 hrs per week of interdisciplinary therapy in a comprehensive inpatient rehab setting.  Physiatrist is providing close team supervision and 24 hour management of active medical problems listed below.  Physiatrist and rehab team continue to assess barriers to discharge/monitor patient progress toward functional and medical goals. Not participating well in therapy for several reasons.  Oriented to place and situation   Plan: 1. lumbar HNP with radiculopathy. Status post laminotomies and facetotomies with postoperative respiratory failure and anoxia  2. Mood/depression/anxiety. Reduce Seroquel due to somnolence, cont Lamictal and vilazodone. Check sleep chart. Discuss cognitive baseline with family  3. DVT Prophylaxis/Anticoagulation: SCDs. Monitor for deep vein thrombosis  4. Pain Management:oxycodone IR 5 mg every 6 hours as needed pain  5. History of renal transplant 2002. Followup renal services as needed.  Latest creatinine reviewed Continue CellCept, Prograf and prednisone 5 mg daily  6. COPD/status post thoracentesis 06/01/2011. Followup pulmonary services as needed. Continue respiratory therapy/Advair/Atrovent/Proventil. Patient with chronic oxygen therapy. Check oxygen saturations every shift  7. Diabetes mellitus. Latest hemoglobin A1c of 6.7. Lantus insulin 15 units daily.will increase Patient with insulin pump prior to admission. Increase Lantus but  D/c noon and supper novalog standing orders 8. Dysphagia/decreased nutritional storage. Followup speech therapy. Monitor for aspiration. Obtain dietary consult  9. Parkinson's disease. Sinemet 25-100 3 times a day  10. 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 Continue aspirin    11. 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 If pt can be re directed,overall now participating.  I believe pt is more aware of deficits and encouraged him to con't  PT/OT/SLP.  . 12.  Urinary retentionre insert foley, D/C bethanacol and flomax13.  Diarrhea,improved       Length of stay, days: 15  Jaideep Pollack E ,  06/23/2011, 7:34 AM   06/23/2011, 7:34 AM

## 2011-06-24 LAB — GLUCOSE, CAPILLARY
Glucose-Capillary: 150 mg/dL — ABNORMAL HIGH (ref 70–99)
Glucose-Capillary: 115 mg/dL — ABNORMAL HIGH (ref 70–99)
Glucose-Capillary: 189 mg/dL — ABNORMAL HIGH (ref 70–99)

## 2011-06-24 NOTE — Progress Notes (Signed)
Physical Therapy Note  Patient Details  Name: John [name] MRN: [id number] Date of Birth: 1940/08/16 Today's Date: 06/24/2011  1100-1155 (55 minutes) individual Pain: no complaint of pain initially; pt low back with movement/premedicated Focus of treatment: Bed mobility/transfer training/ therapeutic exercises to improve functional mobility Treatment: Rolling right or left in bed using bedrails mod/max assist with tactile cues for sequencing; supine to sit mod assist; sitting edge of bed min assist with posterior trunk lean; transfer; squat/pivot (to facilitate forward trunk flexion) max assist +2; UE pushups to raised mat in front of wc 3 X 10; passive bilateral hamstring stretches; manual resistance for seated leg press for bilateral quad strengthening; UE Ergonometer Level 1 Random program 2 X 1 minutes /limited by c/o fatigue and back pain in sitting.   Indira Sorenson,JIM 06/24/2011, 12:36 PM

## 2011-06-24 NOTE — Plan of Care (Signed)
Problem: RH SKIN INTEGRITY Goal: RH STG MAINTAIN SKIN INTEGRITY WITH ASSISTANCE STG Maintain Skin Integrity With minimal Assistance.  Outcome: Not Progressing Total assistance from staff

## 2011-06-24 NOTE — Progress Notes (Signed)
Patient ID: John [name], male   DOB: 1940/09/09, 71 y.o.   MRN: [id number] John [name] is a 71 y.o. male  Subjective: C/o feeling the needto urinate, reassured pt that foley is draining, has 200cc in bag  Objective: Vital signs in last 24 hours: Temp:  [97.2 F (36.2 C)-98.1 F (36.7 C)] 97.2 F (36.2 C) (05/04 0300) Pulse Rate:  [85-86] 86  (05/04 0300) Resp:  [19-20] 19  (05/04 0300) BP: (138-149)/(73-74) 149/74 mmHg (05/04 0300) SpO2:  [92 %-98 %] 94 % (05/04 0729) Weight change:  Last BM Date: 06/23/11  Intake/Output from previous day: 05/03 0701 - 05/04 0700 In: 960 [P.O.:960] Out: 800 [Urine:800] Last cbgs: CBG (last 3)   Basename 06/24/11 0719 06/23/11 2027 06/23/11 1613  GLUCAP 109* 182* 93     Physical Exam Gen NAD Mood/affect approp Cor:  RRR no Murmur Lungs:  Clear Abd:  +BS soft NT Skin:  Excoriation perianal mild/mod Ext:  No C/C/[name]: 5/5 in UEs, 4/5 in LEs except 3-3/5 L ankle, poor truncal control. Intentional tremor Mood/affect- no agitation, follows commands,   Lab Results: Results for orders placed during the hospital encounter of 06/08/11 (from the past 24 hour(s))  GLUCOSE, CAPILLARY     Status: Abnormal   Collection Time   06/23/11 11:53 AM      Component Value Range   Glucose-Capillary 126 (*) 70 - 99 (mg/dL)   Comment 1 Notify RN    CBC     Status: Abnormal   Collection Time   06/23/11 12:15 PM      Component Value Range   WBC 9.1  4.0 - 10.5 (K/uL)   RBC 4.46  4.22 - 5.81 (MIL/uL)   Hemoglobin 10.1 (*) 13.0 - 17.0 (g/dL)   HCT 78.2 (*) 95.6 - 52.0 (%)   MCV 72.2 (*) 78.0 - 100.0 (fL)   MCH 22.6 (*) 26.0 - 34.0 (pg)   MCHC 31.4  30.0 - 36.0 (g/dL)   RDW 21.3 (*) 08.6 - 15.5 (%)   Platelets 211  150 - 400 (K/uL)  GLUCOSE, CAPILLARY     Status: Normal   Collection Time   06/23/11  4:13 PM      Component Value Range   Glucose-Capillary 93  70 - 99 (mg/dL)   Comment 1 Documented in Chart     Comment 2 Notify RN    GLUCOSE,  CAPILLARY     Status: Abnormal   Collection Time   06/23/11  8:27 PM      Component Value Range   Glucose-Capillary 182 (*) 70 - 99 (mg/dL)   Comment 1 Notify RN    GLUCOSE, CAPILLARY     Status: Abnormal   Collection Time   06/24/11  7:19 AM      Component Value Range   Glucose-Capillary 109 (*) 70 - 99 (mg/dL)   Comment 1 Notify RN       Studies/Results: No results found.   Assessment: 1. Functional deficits secondary to Lumbar radiculopathy s/p L5-S1 decompression with muliple post op complications which require15 hrs per week of interdisciplinary therapy in a comprehensive inpatient rehab setting.  Physiatrist is providing close team supervision and 24 hour management of active medical problems listed below.  Physiatrist and rehab team continue to assess barriers to discharge/monitor patient progress toward functional and medical goals. Not participating well in therapy for several reasons.  Oriented to place and situation   Plan: 1. lumbar HNP with radiculopathy. Status post laminotomies and  facetotomies with postoperative respiratory failure and anoxia  2. Mood/depression/anxiety. Reduce Seroquel due to somnolence, cont Lamictal and vilazodone. Check sleep chart. Discuss cognitive baseline with family  3. DVT Prophylaxis/Anticoagulation: SCDs. Monitor for deep vein thrombosis  4. Pain Management:oxycodone IR 5 mg every 6 hours as needed pain  5. History of renal transplant 2002. Followup renal services as needed.  Latest creatinine reviewed Continue CellCept, Prograf and prednisone 5 mg daily  6. COPD/status post thoracentesis 06/01/2011. Followup pulmonary services as needed. Continue respiratory therapy/Advair/Atrovent/Proventil. Patient with chronic oxygen therapy. Check oxygen saturations every shift  7. Diabetes mellitus. Latest hemoglobin A1c of 6.7. Lantus insulin 15 units daily.will increase Patient with insulin pump prior to admission. Increase Lantus but D/c noon and  supper novalog standing orders 8. Dysphagia/decreased nutritional storage. Followup speech therapy. Monitor for aspiration. Obtain dietary consult  9. Parkinson's disease. Sinemet 25-100 3 times a day  10. 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 Continue aspirin    11. 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 If pt can be re directed,overall now participating.  I believe pt is more aware of deficits and encouraged him to con't PT/OT/SLP.  . 12.  Urinary retentionre insert foley, D/C bethanacol and flomax13.  Diarrhea,improved       Length of stay, days: 16  John [name] ,  06/24/2011, 8:34 AM   06/24/2011, 8:34 AM

## 2011-06-24 NOTE — Plan of Care (Signed)
Problem: RH BOWEL ELIMINATION Goal: RH STG MANAGE BOWEL WITH ASSISTANCE STG Manage Bowel with max Assistance.  Outcome: Progressing Patient has not had an bowel movement yet patient expressed that he needed to be changed but it turned out to be gas.

## 2011-06-24 NOTE — Plan of Care (Signed)
Problem: RH BLADDER ELIMINATION Goal: RH STG MANAGE BLADDER WITH ASSISTANCE STG Manage Bladder With max Assistance  Outcome: Not Progressing Foley cath placed on 06/23/11, continued use of flomax Goal: RH STG MANAGE BLADDER WITH EQUIPMENT WITH ASSISTANCE STG Manage Bladder With Equipment With minimal Assistance  Outcome: Not Progressing Foley cath placed on 06/23/11, continued use of flomax  Problem: RH SKIN INTEGRITY Goal: RH STG SKIN FREE OF INFECTION/BREAKDOWN Pt will remain free of any new skin breakdown  No new skin breakdown

## 2011-06-25 LAB — GLUCOSE, CAPILLARY
Glucose-Capillary: 121 mg/dL — ABNORMAL HIGH (ref 70–99)
Glucose-Capillary: 184 mg/dL — ABNORMAL HIGH (ref 70–99)

## 2011-06-25 NOTE — Progress Notes (Signed)
Patient ID: John [name], male   DOB: 06-17-1940, 71 y.o.   MRN: [id number]  Subjective: Oriented to person and [facility] Objective: Vital signs in last 24 hours: Temp:  [97.5 F (36.4 C)-98.6 F (37 C)] 97.5 F (36.4 C) (05/05 0516) Pulse Rate:  [86-87] 87  (05/05 0516) Resp:  [20] 20  (05/05 0516) BP: (134-137)/(64-69) 137/69 mmHg (05/05 0516) SpO2:  [95 %-100 %] 95 % (05/05 0750) Weight change:  Last BM Date: 06/24/11  Intake/Output from previous day: 05/04 0701 - 05/05 0700 In: 470 [P.O.:470] Out: 1300 [Urine:1300] Last cbgs: CBG (last 3)   Basename 06/25/11 0744 06/24/11 2011 06/24/11 1641  GLUCAP 121* 189* 115*     Physical Exam Gen NAD Mood/affect approp Cor:  RRR no Murmur Lungs:  Clear Abd:  +BS soft NT Skin:  Excoriation perianal mild/mod Ext:  No C/C/[employer]: 5/5 in UEs, 4/5 in LEs except 3-3/5 L ankle, poor truncal control. Intentional tremor Mood/affect- no agitation, follows commands,   Lab Results: Results for orders placed during the hospital encounter of 06/08/11 (from the past 24 hour(s))  GLUCOSE, CAPILLARY     Status: Abnormal   Collection Time   06/24/11 11:52 AM      Component Value Range   Glucose-Capillary 150 (*) 70 - 99 (mg/dL)   Comment 1 Notify RN    GLUCOSE, CAPILLARY     Status: Abnormal   Collection Time   06/24/11  4:41 PM      Component Value Range   Glucose-Capillary 115 (*) 70 - 99 (mg/dL)   Comment 1 Notify RN    GLUCOSE, CAPILLARY     Status: Abnormal   Collection Time   06/24/11  8:11 PM      Component Value Range   Glucose-Capillary 189 (*) 70 - 99 (mg/dL)   Comment 1 Notify RN    GLUCOSE, CAPILLARY     Status: Abnormal   Collection Time   06/25/11  7:44 AM      Component Value Range   Glucose-Capillary 121 (*) 70 - 99 (mg/dL)     Studies/Results: No results found.   Assessment: 1. Functional deficits secondary to Lumbar radiculopathy s/p L5-S1 decompression with muliple post op complications which require15 hrs per  week of interdisciplinary therapy in a comprehensive inpatient rehab setting.  Physiatrist is providing close team supervision and 24 hour management of active medical problems listed below.  Physiatrist and rehab team continue to assess barriers to discharge/monitor patient progress toward functional and medical goals. Not participating well in therapy for several reasons.  Oriented to place and situation   Plan: 1. lumbar HNP with radiculopathy. Status post laminotomies and facetotomies with postoperative respiratory failure and anoxia  2. Mood/depression/anxiety. Reduce Seroquel due to somnolence, cont Lamictal and vilazodone. C   3. DVT Prophylaxis/Anticoagulation: SCDs. Monitor for deep vein thrombosis  4. Pain Management:oxycodone IR 5 mg every 6 hours as needed pain  5. History of renal transplant 2002. Followup renal services as needed.  Latest creatinine reviewed Continue CellCept, Prograf and prednisone 5 mg daily  6. COPD/status post thoracentesis 06/01/2011. Followup pulmonary services as needed. Continue respiratory therapy/Advair/Atrovent/Proventil. Patient with chronic oxygen therapy. Check oxygen saturations every shift  7. Diabetes mellitus. Latest hemoglobin A1c of 6.7. Lantus insulin 15 units daily.CBGs well controlled Patient with insulin pump prior to admission. Increase Lantus but D/c noon and supper novalog standing orders 8. Dysphagia/decreased nutritional storage. Followup speech therapy. Monitor for aspiration. Obtain dietary consult  9. Parkinson's disease.  Sinemet 25-100 3 times a day  10. 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 Continue aspirin    11. Agitation- probable anoxic encephalopathy following resp failure post op, worsened by UTI-  I believe pt is more aware of deficits and encouraged him to con't PT/OT/SLP.  . 12.  Urinary retention re insert foley,        Length of stay, days: 17  Suzann Lazaro E ,  06/25/2011, 9:23 AM   06/25/2011, 9:23 AM

## 2011-06-25 NOTE — Progress Notes (Signed)
Physical Therapy Note  Patient Details  Name: John [name] MRN: [id number] Date of Birth: 06/26/1940 Today's Date: 06/25/2011  Time: 900-945 45 minutes  Pt c/o pain in bottom, RN aware.  Bed mobility with mod A with HOB raised.  Sitting EOB x 5 minutes with supervision.  Beasy board transfer bed to w/c with +2 assist, pt requires cues to lean forward and assist with transfer.  Standing in standing frame 3 x 60 seconds with attempts to have pt extend trunk to achieve full upright posture, pt fatigues very easily with this.  Pt with difficulty activating gluts or trunk extensors, unable to sustain full upright position in standing.   Individual therapy   Marcellino Fidalgo 06/25/2011, 4:52 PM

## 2011-06-26 DIAGNOSIS — R5381 Other malaise: Secondary | ICD-10-CM

## 2011-06-26 DIAGNOSIS — Z5189 Encounter for other specified aftercare: Secondary | ICD-10-CM

## 2011-06-26 DIAGNOSIS — IMO0002 Reserved for concepts with insufficient information to code with codable children: Secondary | ICD-10-CM

## 2011-06-26 LAB — GLUCOSE, CAPILLARY
Glucose-Capillary: 121 mg/dL — ABNORMAL HIGH (ref 70–99)
Glucose-Capillary: 179 mg/dL — ABNORMAL HIGH (ref 70–99)

## 2011-06-26 MED ORDER — ALBUTEROL SULFATE (5 MG/ML) 0.5% IN NEBU
2.5000 mg | INHALATION_SOLUTION | Freq: Three times a day (TID) | RESPIRATORY_TRACT | Status: DC
  Administered 2011-06-27 – 2011-06-28 (×5): 2.5 mg via RESPIRATORY_TRACT
  Filled 2011-06-26 (×4): qty 0.5

## 2011-06-26 NOTE — Progress Notes (Signed)
Physical Therapy Session Note  Patient Details  Name: John [name] MRN: [id number] Date of Birth: 1940/10/25  Today's Date: 06/26/2011 Time: 1400-1500 Time Calculation (min): 60 min  Short Term Goals: Week 1:  PT Short Term Goal 1 (Week 1): Patient will perform bed mobility with mod A while recalling 2/3 back precautions PT Short Term Goal 1 - Progress (Week 1): Revised due to lack of progress PT Short Term Goal 2 (Week 1): Patient will tolerate sitting EOB x 20-30 minutes with improved trunk posture for functional activities PT Short Term Goal 2 - Progress (Week 1): Revised due to lack of progress PT Short Term Goal 3 (Week 1): Patient will transfer bed <> w/c with mod A with attention to back precautions PT Short Term Goal 3 - Progress (Week 1): Revised due to lack of progress PT Short Term Goal 4 (Week 1): Patient will perform w/c mobility x 150 on unit with mod A for endurance training PT Short Term Goal 4 - Progress (Week 1): Revised due to lack of progress PT Short Term Goal 5 (Week 1): Patient will tolerate 5 minutes of dynamic standing balance, endurance and pre-gait training PT Short Term Goal 5 - Progress (Week 1): Revised due to lack of progress Week 2:  PT Short Term Goal 1 (Week 2): Patient will perform rolling in bed and sup <> sit with max A and 50-75% verbal cues for attention to task and sequencing PT Short Term Goal 1 - Progress (Week 2): Met PT Short Term Goal 2 (Week 2): Patient will tolerate 5-10 minutes unsupported sitting EOB and 15-20 minutes supported sitting in chair to engage in trunk control, UE and LE strengthening activities PT Short Term Goal 2 - Progress (Week 2): Met PT Short Term Goal 3 (Week 2): Patient will tolerate 5-10 minutes total A supported standing (lift equipment) for pressure relief, WB through bilat LE and trunk control training  PT Short Term Goal 3 - Progress (Week 2): Progressing toward goal PT Short Term Goal 4 (Week 2): Patient will  assist 25% with bed <> w/c transfers without use of lift equipment PT Short Term Goal 4 - Progress (Week 2): Met Week 3:  PT Short Term Goal 1 (Week 3): Patient will perform bed mobility with log roll and transfers bed <> w/c with mod A PT Short Term Goal 2 (Week 3): Patient will tolerate 5-10 minutes total A supported standing (lift equipment) for pressure relief, WB through bilat LE and trunk control training PT Short Term Goal 3 (Week 3): Patient will tolerate 2 hours OOB in w/c and 20-30 minutes unsupported sitting for trunk control training PT Short Term Goal 4 (Week 3): Patient will progress to high back manual w/c and begin w/c mobility training x 50' with mod A  Therapy Documentation Precautions:  Precautions Precautions: Fall;Back Precaution Comments: No bending, arching, twisting; on supplemental 02 to maintain sats >92% Restrictions Weight Bearing Restrictions: No Vital Signs: Therapy Vitals Temp: 98.2 F (36.8 C) Temp src: Oral Pulse Rate: 91  Resp: 18  BP: 116/50 mmHg Patient Position, if appropriate: Lying Oxygen Therapy SpO2: 94 % O2 Device: Nasal cannula O2 Flow Rate (L/min): 2 L/min Pain: Pain Assessment Pain Assessment: Faces Faces Pain Scale: Hurts whole lot Pain Type: Neuropathic pain;Chronic pain Pain Location: Leg Pain Orientation: Right;Left Pain Descriptors: Tightness;Other (Comment) (stretching) Pain Intervention(s): Rest;Repositioned Mobility:  Patient performed transfers w/c <> mat with Beasy board and mod-max A for anterior lean to bring COG over feet  for activation of LE extensors; supine <> side with log roll with max A for sequence and full rolling sequence and for pelvic depression and trunk elongation and shortening to transfer side to sit.  Patient able to sit unsupported on mat with trunk erect with supervision; may try patient in regular manual w/c tomorrow. Exercises:  Performed supine bilat LE gastroc, hamstring, hip and glute stretches and  isometric activation of glutes and quads for increased ROM and strengthening for transfers and standing  See FIM for current functional status  Therapy/Group: Individual Therapy  Edman Circle [facility] 06/26/2011, 4:59 PM

## 2011-06-26 NOTE — Progress Notes (Signed)
Per State Regulation 482.30 This chart was reviewed for medical necessity with respect to the patient's Admission/Duration of stay. [name], Kensy Blizard Anne                 Nurse Care Manager            Next Review Date: 06/30/11   

## 2011-06-26 NOTE — Progress Notes (Signed)
Occupational Therapy Session Note  Patient Details  Name: John [name] MRN: [id number] Date of Birth: 10/01/1940  Today's Date: 06/26/2011 Time: 1032-1130 Time Calculation (min): 58 min  Short Term Goals: Week 3:  OT Short Term Goal 1 (Week 3): Pt will maintain sitting EOB with no more than min assist during bathing. OT Short Term Goal 2 (Week 3): Pt will perfrom LB bathing sit to stand with max assist. OT Short Term Goal 3 (Week 3): Pt will perform toilet transfer to 3:1 with max stand/squat pivot. OT Short Term Goal 4 (Week 3): Pt will comb his hair with setup and no verbal cueing for thoroughness.  Skilled Therapeutic Interventions/Progress Updates:    Performed bathing and dressing sit to stand from the EOB.  Pt sat EOB entire session but needed overall mod assistance to maintain balance secondary to frequent LOB posteriorly and to the left.  Pt occasionally with only the need for min assist to balance but only for a brief period of static sitting time.  Utilized the [employer] for sit to stand and standing during LB selfcare.  Pt only able to tolerate short periods of standing, approximately 30 seconds to 1 min secondary to complaints of bilateral leg pain.  Pt also unable to maintain upright trunk or head in standing and keeps them rested on the [employer].  O2 sats 90 to 92% on room air during session.  Therapy Documentation Precautions:  Precautions Precautions: Fall;Back Precaution Comments: No bending, arching, twisting; on supplemental 02 to maintain sats >92% Restrictions Weight Bearing Restrictions: No  Pain: Pt reports bilateral LE pain in standing with [place] [employer].  No complaints of pain when sitting.  ADL: See FIM for current functional status  Therapy/Group: Individual Therapy  Orva Riles 06/26/2011, 3:41 PM

## 2011-06-26 NOTE — Progress Notes (Signed)
Speech Language Pathology Daily Session Note  Patient Details  Name: John [name] MRN: [id number] Date of Birth: 05-29-1940  Today's Date: 06/26/2011 Time: 4098-1191 Time Calculation (min): 55 min  Short Term Goals: Week 3: SLP Short Term Goal 1 (Week 3): Pt will demonstrate sustained attention to a functional task for 15 minutes with Mod A verbal cues for redirection SLP Short Term Goal 2 (Week 3): Pt will demonstrate functional problem solving with familiar tasks with Min A verbal cues. SLP Short Term Goal 3 (Week 3): Patient will comsume regular textures and thin liquids without oberved s/s of aspiration and moderate assist semantic cues to utilize strategies.  Skilled Therapeutic Interventions: Session focuses on trials of upgraded textures and safety with self feeding; SLP facilitated session with moderate assist semantic cues to sit up at 90 degrees and minimal assist semantic cues to consume small sips and bites with regular textures and thin liquids via straw with no observed s/s of aspiration.  RN administered pain medication during session and SLP facilitated with cues to take 1 pill whole with water via cup sip, at a time also with no observed s/s of aspiration with small pills.  Recommend diet upgrade to regular textures and thin liquids with continued full supervision due to consistent need for minimal assist cues to follow safe swallow procedures.    Daily Session Precautions/Restrictions    FIM:  Comprehension Comprehension Mode: Auditory Comprehension: 4-Understands basic 75 - 89% of the time/requires cueing 10 - 24% of the time Expression Expression Mode: Verbal Expression: 4-Expresses basic 75 - 89% of the time/requires cueing 10 - 24% of the time. Needs helper to occlude trach/needs to repeat words. Social Interaction Social Interaction: 4-Interacts appropriately 75 - 89% of the time - Needs redirection for appropriate language or to initiate interaction. Problem  Solving Problem Solving: 2-Solves basic 25 - 49% of the time - needs direction more than half the time to initiate, plan or complete simple activities Memory Memory: 3-Recognizes or recalls 50 - 74% of the time/requires cueing 25 - 49% of the time FIM - Eating Eating Activity: 5: Supervision/cues;5: Set-up assist for open containers General    Pain Pain Assessment Pain Assessment: 0-10 Pain Score:   7 Pain Type: Acute pain;Chronic pain Pain Location: Back Pain Orientation: Lower Pain Descriptors: Aching Pain Onset: Gradual Patients Stated Pain Goal: 3 Pain Intervention(s): RN made aware;Repositioned Multiple Pain Sites: No  Therapy/Group: Individual Therapy  Charlane Ferretti., CCC-SLP [phone]  Sharnell Knight 06/26/2011, 11:05 AM

## 2011-06-26 NOTE — Progress Notes (Signed)
Patient ID: John [name], male   DOB: 02-02-1941, 71 y.o.   MRN: [id number]  Subjective: Oriented to person and [facility], recognizes me as MD Objective: Vital signs in last 24 hours: Temp:  [97.7 F (36.5 C)-98.8 F (37.1 C)] 98.8 F (37.1 C) (05/06 0500) Pulse Rate:  [83-88] 88  (05/06 0500) Resp:  [18] 18  (05/06 0500) BP: (125-142)/(62-74) 142/74 mmHg (05/06 0500) SpO2:  [95 %-98 %] 97 % (05/06 0500) Weight change:  Last BM Date: 06/25/11  Intake/Output from previous day: 05/05 0701 - 05/06 0700 In: 840 [P.O.:840] Out: 1950 [Urine:1950] Last cbgs: CBG (last 3)   Basename 06/26/11 0720 06/25/11 2112 06/25/11 1637  GLUCAP 121* 120* 127*     Physical Exam Gen NAD Mood/affect approp Cor:  RRR no Murmur Lungs:  Clear Abd:  +BS soft NT Skin:  Excoriation perianal mild/mod Ext:  No C/C/E Motors: 5/5 in UEs, 4/5 in LEs except 3-3/5  B ankle, poor truncal control. Intentional tremor Mood/affect- no agitation, follows commands,   Lab Results: Results for orders placed during the hospital encounter of 06/08/11 (from the past 24 hour(s))  GLUCOSE, CAPILLARY     Status: Abnormal   Collection Time   06/25/11  7:44 AM      Component Value Range   Glucose-Capillary 121 (*) 70 - 99 (mg/dL)  GLUCOSE, CAPILLARY     Status: Abnormal   Collection Time   06/25/11 11:56 AM      Component Value Range   Glucose-Capillary 184 (*) 70 - 99 (mg/dL)  GLUCOSE, CAPILLARY     Status: Abnormal   Collection Time   06/25/11  4:37 PM      Component Value Range   Glucose-Capillary 127 (*) 70 - 99 (mg/dL)  GLUCOSE, CAPILLARY     Status: Abnormal   Collection Time   06/25/11  9:12 PM      Component Value Range   Glucose-Capillary 120 (*) 70 - 99 (mg/dL)   Comment 1 Notify RN    GLUCOSE, CAPILLARY     Status: Abnormal   Collection Time   06/26/11  7:20 AM      Component Value Range   Glucose-Capillary 121 (*) 70 - 99 (mg/dL)   Comment 1 Notify RN       Studies/Results: No results  found.   Assessment: 1. Functional deficits secondary to Lumbar radiculopathy s/p L5-S1 decompression with muliple post op complications which require15 hrs per week of interdisciplinary therapy in a comprehensive inpatient rehab setting.  Physiatrist is providing close team supervision and 24 hour management of active medical problems listed below.  Physiatrist and rehab team continue to assess barriers to discharge/monitor patient progress toward functional and medical goals. Not participating well in therapy for several reasons.  Oriented to place and situation   Plan: 1. lumbar HNP with radiculopathy. Status post laminotomies and facetotomies with postoperative respiratory failure and anoxia  2. Mood/depression/anxiety. Reduce Seroquel due to somnolence, cont Lamictal and vilazodone. C   3. DVT Prophylaxis/Anticoagulation: SCDs. Monitor for deep vein thrombosis  4. Pain Management:oxycodone IR 5 mg every 6 hours as needed pain  5. History of renal transplant 2002. Followup renal services as needed.  Latest creatinine reviewed Continue CellCept, Prograf and prednisone 5 mg daily  6. COPD/status post thoracentesis 06/01/2011. Followup pulmonary services as needed. Continue respiratory therapy/Advair/Atrovent/Proventil. Patient with chronic oxygen therapy. Check oxygen saturations every shift  7. Diabetes mellitus. Latest hemoglobin A1c of 6.7. Lantus insulin 15 units daily.CBGs well controlled Patient  with insulin pump prior to admission. Increase Lantus but D/c noon and supper novalog standing orders 8. Dysphagia/decreased nutritional storage. Followup speech therapy. Monitor for aspiration. Obtain dietary consult  9. Parkinson's disease. Sinemet 25-100 3 times a day  10. 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 Continue aspirin    11. 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  . 12.  Urinary retention  foley,        Length of stay, days: 18  John [name] E ,  06/26/2011, 7:43 AM   06/26/2011, 7:43 AM

## 2011-06-27 DIAGNOSIS — Z5189 Encounter for other specified aftercare: Secondary | ICD-10-CM

## 2011-06-27 DIAGNOSIS — R5381 Other malaise: Secondary | ICD-10-CM

## 2011-06-27 DIAGNOSIS — IMO0002 Reserved for concepts with insufficient information to code with codable children: Secondary | ICD-10-CM

## 2011-06-27 LAB — GLUCOSE, CAPILLARY
Glucose-Capillary: 122 mg/dL — ABNORMAL HIGH (ref 70–99)
Glucose-Capillary: 155 mg/dL — ABNORMAL HIGH (ref 70–99)

## 2011-06-27 NOTE — Progress Notes (Addendum)
Physical Therapy Discharge Summary  Patient Details  Name: John [name] MRN: [id number] Date of Birth: 12-May-1940  Today's Date: 06/27/2011 Time: 4742-5956 Time Calculation (min): 56 min  Patient has made slow progress toward PT LTG and patient's goals had to be downgraded multiple times secondary to slow progress.  Patient's progress towards goals significantly limited by multiple medical impairments (diarrhea, UTI), impaired memory, initiation, attention and awareness, severe deconditioning, impaired activity tolerance, endurance, increased pain, bilat LE, UE and core weakness with impaired trunk control in sitting and static standing, impaired sitting and standing balance.  Although patient met 0 of 9 long term goals he was able to progress from total A +2 assistance or use of lift equipment for all functional mobility to mod-max A for bed mobility flat bed, bed <> w/c transfers with sliding board and w/c mobility in regular manual w/c due to improved activity tolerance, improved balance, improved postural control, increased strength, decreased pain, functional use of  right upper extremity, right lower extremity, left upper extremity and left lower extremity, improved attention and improved awareness.  Patient to discharge at a wheelchair level Max Assist.   Patient's care partner is currently unable to provide the patient with the necessary physical assistance to D/C home and patient will require more prolonged rehabilitation at SNF to reach higher functional level to decrease caregiver burden of care and increase safety with functional mobility prior to D/C home.   Reasons goals not met: Patient continues to require max A for functional mobility at w/c level secondary to multiple medical impairments (diarrhea, UTI), impaired memory, initiation, attention and awareness, severe deconditioning, impaired activity tolerance, endurance, increased pain, bilat LE, UE and core weakness with impaired trunk  control in sitting and static standing, impaired sitting and standing balance.  Patient to D/C from hospital to SNF.  Recommendation:  Patient will benefit from ongoing skilled PT services in skilled nursing facility setting to continue to advance safe functional mobility, address ongoing impairments in impaired cognition, impaired activity tolerance, endurance, pain, bilat LE, UE and core weakness with impaired trunk control in sitting and static standing, impaired sitting and standing balance, gait, and to minimize fall risk.  Equipment: deferred to next venue  Reasons for discharge: discharge from hospital  Patient/family agrees with progress made and goals achieved: Yes  PT Discharge Precautions/Restrictions Precautions Precautions: Back;Fall Precaution Comments: No bending, arching, twisting; on supplemental 02 to maintain sats >92% Vital Signs Oxygen Therapy SpO2: 98 % O2 Device: Nasal cannula O2 Flow Rate (L/min): 2 L/min Pain Pain Assessment Pain Assessment: No/denies pain (at rest) Sensation Sensation Light Touch: Impaired by gross assessment Light Touch Impaired Details: Impaired RLE Coordination Gross Motor Movements are Fluid and Coordinated: No Fine Motor Movements are Fluid and Coordinated: No Motor  Motor Motor: Abnormal postural alignment and control;Other (comment) (Tremor) Motor - Discharge Observations: resting tremor, bilat LE weakness R>L; sits with slight posterior lean and L lateral lean, stands with significant trunk flexion and unable to maintain upright trunk in standing  Mobility Bed Mobility Sit to Supine: 1: +1 Total assist;2: Max assist Sit to Supine - Details (indicate cue type and reason): Supine to sit log rolling on flat bed with rails with max A for full roll and for side to sit upright EOB; sit > supine through log rolling with total A to lower trunk and bring bilat LE into bed; still presents with decreased trunk and pelvis ROM and  dissociation Transfers Lateral/Scoot Transfers: 2: Max assist Lateral/Scoot Transfer Details (  indicate cue type and reason): Scooting to L and R bed <> w/c with Beasy board x 3 reps with max A for full anterior lean to unweight buttocks and for cues to extend UE and LE to laterally scoot; assistance needed to advance LE laterally to maintain WB Locomotion  Ambulation Ambulation: No Stairs / Additional Locomotion Stairs: No Wheelchair Mobility Wheelchair Mobility: Yes in regular manual w/c without reclining or tilt in space back Wheelchair Assistance: 4: Min Education officer, museum: Both upper extremities Wheelchair Parts Management: Needs assistance Distance: 50; verbal and tactile cues for hand placement and propulsion sequence and min assistance to maintain straight path in hallway; one rest break required  Trunk/Postural Assessment  Cervical Assessment Cervical Assessment: Exceptions to [facility] (significant forward head posture, limited rotation, ext) Thoracic Assessment Thoracic Assessment:  (Rests in kyphosis) Lumbar Assessment Lumbar Assessment:  (lumbar kyphosis, no ability to perform lordosis) Postural Control Postural Control: Deficits on evaluation Postural Limitations: Multiple back surgeries, limited spinal, rib and pelvic ROM and dissociation. Sits with posterior pelvic tilt and posterior lean, L trunk shortening, R elongation; unable to hold trunk in upight position in standing  Balance Static Sitting Balance Static Sitting - Balance Support: No upper extremity supported;Feet supported Static Sitting - Level of Assistance: 5: Stand by assistance Static Sitting - Comment/# of Minutes: 3-4 minutes without UE support; able to lean out of BOS and return to midline without UE support Extremity Assessment   RLE Assessment RLE Assessment: Exceptions to [facility] RLE Strength RLE Overall Strength: Deficits;Due to pain;Due to premorbid status RLE Overall Strength Comments: 2/5  throughout; still presents with R foot drop LLE Assessment LLE Assessment: Exceptions to [facility] LLE Strength LLE Overall Strength: Deficits;Due to pain;Due to premorbid status LLE Overall Strength Comments: 3+/5 throughout  See FIM for current functional status  Edman Circle [facility] 06/27/2011, 4:53 PM

## 2011-06-27 NOTE — Progress Notes (Signed)
Patient ID: John [name], male   DOB: 05/06/1940, 71 y.o.   MRN: [id number]   Subjective: Oriented to person and [facility], recognizes me as MD "I have diarrhea"  NA just cleaned up pt , incont of small formed stool No abd pain, N/V Objective: Vital signs in last 24 hours: Temp:  [97.7 F (36.5 C)-98.8 F (37.1 C)] 98.8 F (37.1 C) (05/06 0500) Pulse Rate:  [83-88] 88  (05/06 0500) Resp:  [18] 18  (05/06 0500) BP: (125-142)/(62-74) 142/74 mmHg (05/06 0500) SpO2:  [95 %-98 %] 97 % (05/06 0500) Weight change:  Last BM Date: 06/25/11  Intake/Output from previous day: 05/05 0701 - 05/06 0700 In: 840 [P.O.:840] Out: 1950 [Urine:1950] Last cbgs: CBG (last 3)   Basename 06/26/11 0720 06/25/11 2112 06/25/11 1637  GLUCAP 121* 120* 127*     Physical Exam Gen NAD Mood/affect approp Cor:  RRR no Murmur Lungs:  Clear Abd:  +BS soft NT Skin:  Excoriation perianal mild/mod Ext:  No C/C/[name]: 5/5 in UEs, 4/5 in LEs except 3-3/5  B ankle, poor truncal control. Intentional tremor Mood/affect- no agitation, follows commands,   Lab Results: Results for orders placed during the hospital encounter of 06/08/11 (from the past 24 hour(s))  GLUCOSE, CAPILLARY     Status: Abnormal   Collection Time   06/25/11  7:44 AM      Component Value Range   Glucose-Capillary 121 (*) 70 - 99 (mg/dL)  GLUCOSE, CAPILLARY     Status: Abnormal   Collection Time   06/25/11 11:56 AM      Component Value Range   Glucose-Capillary 184 (*) 70 - 99 (mg/dL)  GLUCOSE, CAPILLARY     Status: Abnormal   Collection Time   06/25/11  4:37 PM      Component Value Range   Glucose-Capillary 127 (*) 70 - 99 (mg/dL)  GLUCOSE, CAPILLARY     Status: Abnormal   Collection Time   06/25/11  9:12 PM      Component Value Range   Glucose-Capillary 120 (*) 70 - 99 (mg/dL)   Comment 1 Notify RN    GLUCOSE, CAPILLARY     Status: Abnormal   Collection Time   06/26/11  7:20 AM      Component Value Range   Glucose-Capillary 121 (*)  70 - 99 (mg/dL)   Comment 1 Notify RN       Studies/Results: No results found.   Assessment: 1. Functional deficits secondary to Lumbar radiculopathy s/p L5-S1 decompression with muliple post op complications which require15 hrs per week of interdisciplinary therapy in a comprehensive inpatient rehab setting.  Physiatrist is providing close team supervision and 24 hour management of active medical problems listed below.  Physiatrist and rehab team continue to assess barriers to discharge/monitor patient progress toward functional and medical goals. Not participating well in therapy for several reasons.  Oriented to place and situation   Plan: 1. lumbar HNP with radiculopathy. Status post laminotomies and facetotomies with postoperative respiratory failure and anoxia  2. Mood/depression/anxiety. Reduce Seroquel due to somnolence, cont Lamictal and vilazodone. C   3. DVT Prophylaxis/Anticoagulation: SCDs. Monitor for deep vein thrombosis  4. Pain Management:oxycodone IR 5 mg every 6 hours as needed pain  5. History of renal transplant 2002. Followup renal services as needed.  Latest creatinine reviewed Continue CellCept, Prograf and prednisone 5 mg daily  6. COPD/status post thoracentesis 06/01/2011. Followup pulmonary services as needed. Continue respiratory therapy/Advair/Atrovent/Proventil. Patient with chronic oxygen therapy. Check oxygen  saturations every shift  7. Diabetes mellitus. Latest hemoglobin A1c of 6.7. Lantus insulin 15 units daily.CBGs well controlled Patient with insulin pump prior to admission. Increase Lantus but D/c noon and supper novalog standing orders 8. Dysphagia/decreased nutritional storage. Followup speech therapy. Monitor for aspiration. Obtain dietary consult  9. Parkinson's disease. Sinemet 25-100 3 times a day  10. 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 Continue aspirin    11. Agitation- probable anoxic encephalopathy following resp failure post op, I believe pt is more aware  of deficits and encouraged him to con't PT/OT/SLP.  . 12.  Urinary retention foley,        Length of stay, days: 18  Rudolpho Claxton [name] ,  06/26/2011, 7:43 AM   06/26/2011, 7:43 AM

## 2011-06-27 NOTE — Discharge Summary (Signed)
  Discharge summary job # [id number]

## 2011-06-27 NOTE — Progress Notes (Signed)
Nutrition Follow-up  Advanced to Regular diet on 5/6. Intake remains poor from 0 - 25%. Discussed PO intake with SLP who notes that pt's intake has remained poor partially 2/2 mood. This RD attempted to discuss PO intake with patient, however, pt reports he is frustrated and not willing to speak with RD at this time.  Diet Order:  Regular Supplements: Ensure Pudding TID, Glucerna Shakes BID  Meds: Scheduled Meds:   . albuterol  2.5 mg Nebulization TID  . antiseptic oral rinse  1 application Mouth Rinse BID  . aspirin  81 mg Per Tube Daily  . calcium carbonate  2 tablet Per Tube Daily  . carbidopa-levodopa  1 tablet Per Tube TID  . clopidogrel  75 mg Oral Q breakfast  . ezetimibe  10 mg Oral Daily  . feeding supplement  1 Container Oral TID BM  . feeding supplement  237 mL Oral BID BM  . ferrous sulfate  325 mg Oral Q breakfast  . folic acid  1 mg Oral Daily  . furosemide  80 mg Oral Daily  . insulin aspart  0-20 Units Subcutaneous TID WC  . insulin aspart  6 Units Subcutaneous q morning - 10a  . insulin glargine  35 Units Subcutaneous Daily  . isosorbide mononitrate  60 mg Oral Daily  . lamoTRIgine  200 mg Oral Daily  . mycophenolate  500 mg Oral BID  . pantoprazole  40 mg Oral Q1200  . predniSONE  5 mg Oral Q breakfast  . pyridostigmine  60 mg Oral TID  . QUEtiapine  25 mg Oral BID  . saccharomyces boulardii  500 mg Oral BID  . tacrolimus  5 mg Oral BID  . Vilazodone HCl  20 mg Oral Daily  . DISCONTD: albuterol  2.5 mg Nebulization QID   Continuous Infusions:  PRN Meds:.acetaminophen, alum & mag hydroxide-simeth, diphenoxylate-atropine, ondansetron (ZOFRAN) IV, ondansetron, oxyCODONE, sodium chloride  Labs:  CMP     Component Value Date/Time   NA 134* 06/19/2011 1010   K 4.0 06/19/2011 1010   CL 97 06/19/2011 1010   CO2 29 06/19/2011 1010   GLUCOSE 286* 06/19/2011 1010   BUN 14 06/19/2011 1010   CREATININE 0.83 06/19/2011 1010   CALCIUM 9.6 06/19/2011 1010   PROT 6.2  06/13/2011 1255   ALBUMIN 1.9* 06/13/2011 1255   AST 11 06/13/2011 1255   ALT 7 06/13/2011 1255   ALKPHOS 120* 06/13/2011 1255   BILITOT 0.7 06/13/2011 1255   GFRNONAA 86* 06/19/2011 1010   GFRAA >90 06/19/2011 1010   CBG (last 3)   Basename 06/27/11 0711 06/26/11 2156 06/26/11 1719  GLUCAP 122* 227* 179*     Intake/Output Summary (Last 24 hours) at 06/27/11 1101 Last data filed at 06/26/11 1800  Gross per 24 hour  Intake    440 ml  Output   1100 ml  Net   -660 ml   Weight Status:  93.9 kg - no new wt  Estimated needs:  2150 - 2350 kcal, 120 - 130 grams protein  Nutrition Dx:  Inadequate oral intake r/t poor appetite, severe deconditioning, fatigue AEB poor PO intake x several days and decreasing weight trend. Continues.  Goal:  Pt to consume >/= 50% of meals and supplements. Unmet.  Intervention:  Continue to encourage PO intake as able. RD to continue to follow.  Monitor:  Weights, labs, PO intake, I/O's  [facility] [employer] Pager #:  [phone]

## 2011-06-27 NOTE — Discharge Summary (Signed)
John [name], [name] NO.:  [id number]  MEDICAL RECORD NO.:  [id number]  LOCATION:  4035                         FACILITY:  [facility]  PHYSICIAN:  Erick Colace, M.D.DATE OF BIRTH:  1940-10-02  DATE OF ADMISSION:  06/08/2011 DATE OF DISCHARGE:                              DISCHARGE SUMMARY   DISCHARGE DIAGNOSES: 1. Lumbar herniated nucleus pulposus with radiculopathy, status post     laminotomies and fasciotomies with postoperative respiratory     failure and anoxia. 2. Mood/depression/anxiety. 3. Sequential compression devices for deep venous thrombosis     prophylaxis. 4. Pain management. 5. History of renal transplant in 2002. 6. Chronic obstructive pulmonary disease, status post thoracentesis on     June 01, 2011. 7. Diabetes mellitus. 8. Dysphagia. 9. Parkinson disease. 10.Coronary artery disease, coronary artery bypass grafting. 11.Urinary retention. 12.History of orthostatic hypotension. 13.Anemia.  HISTORY OF PRESENT ILLNESS:  This is a 71 year old right-handed male with history of coronary artery disease, atrial fibrillation, renal transplant in 2002 as well as COPD with chronic oxygen therapy, admitted on May 16, 2011, with radiating low back pain.  X-rays and imaging revealed spondylosis and herniated nucleus pulposus lumbar L5-S1 with radiculopathy.  Underwent bilateral laminotomies and foraminotomies, decompression lumbar L5 and S1 On May 16, 2011 by Dr. Danielle Dess.  Routine back precautions and no brace required.  Postoperative confusion, respiratory distress secondary to congestive heart failure, initially placed on Lasix therapy.  On May 21, 2011, developed worsening respiratory distress and hypoxia with Critical Care Medicine consulted and was intubated.  The patient spiked a fever, placed on empiric antibiotics.  Blood cultures on May 18, 2011, were negative.  The patient later extubated on June 02, 2011, and monitored  closely. Underwent right thoracentesis on June 01, 2011, for pleural effusion with 400 mL removed by Interventional Radiology.  Diet; slowly advanced to mechanical soft, thin liquids.  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  The patient was admitted for a comprehensive rehab program.  PAST MEDICAL HISTORY:  See discharge diagnoses.  ALLERGIES:  None.  SOCIAL HISTORY:  Lives with wife and assistance as needed.  Functional history prior to admission, needed assistance for activities of daily living.  Functional status upon admission to Rehab Services was moderate-assist for mobility as well as activities of daily living with limited endurance and strength.  PHYSICAL EXAMINATION:  VITAL SIGNS:  Blood pressure 112/70, pulse 75, respirations 18, temperature 98. GENERAL:  This was an alert male, followed basic commands.  He was able to provide his name, name of the hospital in [place], and date of birth accurately.  He does have some confusion over his long hospital stay in all of instead have occurred.  He had a pill rolling tremor. LUNGS:  Decreased breath sounds.  Clear to auscultation. CARDIAC:  Regular rate and rhythm. ABDOMEN:  Soft, nontender, good bowel sounds. BACK:  Incision was well healed.  He had some excoriated areas to his buttocks.  REHABILITATION HOSPITAL COURSE:  The patient was admitted to Inpatient Rehab Services with therapies initiated on a 3-hour daily schedule consisting of physical therapy, occupational therapy, speech therapy, and rehabilitation nursing.  The following issues were addressed during the patient's rehabilitation stay.  Pertaining to [name]' lumbar radiculopathy, he had undergone laminotomies, fasciotomies with postoperative respiratory failure and anoxia.  No back brace required, mobility remained limited.  He would follow up with Neurosurgery,  Dr. Danielle Dess.  Ongoing bouts of restlessness as well as anxiety with emotional support provided.  He remained on low-dose Seroquel 25 mg twice daily. The patient had a history of renal transplant in 2002.  He remained on Prograf, CellCept as well as low-dose prednisone therapy.  Pain management with the use of oxycodone.  He did have a history of noted Parkinson disease, remaining on Sinemet 3 times daily.  The patient exhibited no signs of fluid overload during his course.  He remained on Lasix therapy.  He did have some difficulty in voiding.  Intermittent catheterizations were difficulty, Foley catheter tube was inserted while on diuretic therapy and noted urinary retention.  Latest urinalysis study is negative.  He did have a history of diabetes mellitus. Hemoglobin A1c of 6.7.  His Lantus insulin was adjusted accordingly. The patient did have an insulin pump, this was not used during his hospital stay.  Noted history of coronary artery disease with bypass grafting.  He remained on aspirin therapy as well as Plavix.  The patient received weekly collaborative interdisciplinary team conferences to discuss estimated length of stay, family teaching, and any barriers to his discharge.  He was upper body bathing and dressing minimal-assist in a supported sitting position, total-assist of 2 for lower body bathing and dressing.  He needs max encouragement to maintain sitting during self-care tasks, max-assist bed mobility of 2, wheelchair transfers with a [place] Dec or sliding board due to limited advances and wife could not provide the necessary physical assistance.  It was felt skilled nursing facility was needed, but bed becoming available on Jun 28, 2011, and discharge taking place at that time.  DISCHARGE MEDICATIONS: 1. Tylenol 650 mg p.o. every 4 hours as needed. 2. Albuterol hand-held nebulizer 2.5 mg 3 times daily. 3. Aspirin 81 mg p.o. daily. 4. Os-Cal 2 tablets daily. 5. Sinemet 25-100,  1 tablet p.o. t.i.d. 6. Plavix 75 mg p.o. daily. 7. Zetia 10 mg p.o. daily. 8. Ferrous sulfate 325 mg p.o. daily. 9. Folic acid 1 mg p.o. daily. 10.Lasix 80 mg p.o. daily. 11.NovoLog insulin aspart 6 units every morning at 10 a.m. 12.Lantus insulin subcutaneous 35 units daily. 13.Imdur 60 mg p.o. daily. 14.Lamictal 200 mg p.o. daily. 15.CellCept 500 mg p.o. b.i.d. 16.Oxycodone immediate release 5 mg 1 tablet every 6 hours as needed     for pain. 17.Protonix 40 mg p.o. daily. 18.Prednisone 5 mg p.o. breakfast. 19.Mestinon 60 mg p.o. t.i.d. 20.Seroquel 25 mg p.o. b.i.d. 21.Florastor 500 mg p.o. b.i.d. 22.Prograf 5 mg p.o. b.i.d. 23.Vilazodone 20 mg p.o. daily.  His diet was 1800 calorie ADA mechanical, soft, thin liquids.  SPECIAL INSTRUCTIONS:  Chronic oxygen at 2 liters.  Keep oxygen saturations greater than 90%.  Foley catheter tube in place for urinary retention and limited mobility that should be changed per protocol.  The patient could follow up with Dr. [name], medical management as directed, Dr. Claudette Laws, the Outpatient Rehab Service office as needed.     Mariam Dollar, P.A.   ______________________________ Erick Colace, M.D.    DA/MEDQ  D:  06/27/2011  T:  06/27/2011  Job:  [id number]  cc:   Larina Earthly, M.D.

## 2011-06-27 NOTE — Progress Notes (Addendum)
Occupational Therapy Discharge Summary  Patient Details  Name: John [name] MRN: [id number] Date of Birth: Feb 26, 1940  Today's Date: 06/27/2011 Time: 7829-5621 Time Calculation (min): 61 min  Therapy Session:  Worked on bathing and dressing sit to stand at the EOB.  Pt able to sit EOB with min assist initially during UB selfcare but after 4-5 mins progresses to leaning posteriorly requiring constant max instructional cueing and mod assist to sit up.  Utilized Sara Plus for sit to stand and standing for peri care and pulling pants over hips.  Limited ability to tolerate standing secondary to pain in his LEs.  Did tolerate standing for 1-2 mins using UEs on platform to achieve and maintain some slight trunk extension on first attempt.  Next few attempts unable to tolerate more than 30 to 45 seconds of activity.     Patient has met 1 of 9 long term goals due to improved activity tolerance, improved balance, postural control and improved attention.  Patient to discharge at overall total +2 assist level for mbility and selfcare. level.  Patient's care partner unable to provide the necessary physical assistance at discharge.    Reasons goals not met: Pt continues to need total +2 assist for all LB selfcare and toileting tasks as well as increased overall min assist for grooming and dressing.  Recommendation:  Patient will benefit from ongoing skilled OT services in skilled nursing facility setting to continue to advance functional skills in the area of BADL.  Pt still needs significant work on ADL independence, balance (both sitting and standing), endurance and safety.   Equipment: No equipment provided  Reasons for discharge: discharge from hospital  Patient/family agrees with progress made and goals achieved: Yes  OT Discharge Precautions/Restrictions  Precautions Precautions: Back;Fall Precaution Comments: No bending, arching, twisting; on supplemental 02 to maintain sats >92%  Vital  Signs Oxygen Therapy SpO2: 98 % O2 Device: Nasal cannula O2 Flow Rate (L/min): 2 L/min Pain Pain Assessment Pain Assessment: No/denies pain (at rest) ADL ADL Equipment Provided: Reacher Eating: Set up Where Assessed-Eating: Wheelchair Grooming: Moderate cueing;Minimal assistance Where Assessed-Grooming: Wheelchair Upper Body Bathing: Supervision/safety;Moderate cueing Where Assessed-Upper Body Bathing: Edge of bed (supported sitting) Lower Body Bathing: Dependent Where Assessed-Lower Body Bathing: Edge of bed Upper Body Dressing: Minimal assistance;Minimal cueing Where Assessed-Upper Body Dressing: Edge of bed Lower Body Dressing: Dependent Where Assessed-Lower Body Dressing: Edge of bed Toileting: Dependent Where Assessed-Toileting: Other (Comment) (Pt using brief at this time secondary to incontinence.) Toilet Transfer: Dependent Toilet Transfer Method: Stand pivot Acupuncturist: Gaffer: Not assessed Film/video editor: Not assessed ADL Comments: Pt tolerating sitting EOB better but still needs total +2 assistance for sit to stand and to maintain standing for just a few seconds during LB selfcare tasks. Still needs mod instructional cueing during bathing for increased thoroughness. Vision/Perception  Vision - History Baseline Vision: Wears glasses only for reading Patient Visual Report: No change from baseline Vision - Assessment Vision Assessment: Vision not tested Perception Perception: Not tested Praxis Praxis: Not tested  Cognition Overall Cognitive Status: Impaired Arousal/Alertness: Awake/alert Orientation Level: Oriented to person;Oriented to place;Disoriented to situation Attention: Sustained Focused Attention: Appears intact Sustained Attention: Appears intact Memory: Impaired Memory Impairment: Storage deficit;Decreased recall of new information Awareness: Impaired Awareness Impairment: Emergent  impairment Problem Solving: Impaired Sensation Sensation Light Touch: Appears Intact (Intact in bilateral UEs.) Light Touch Impaired Details: Impaired RLE Stereognosis: Appears Intact Hot/Cold: Appears Intact Proprioception: Appears Intact Coordination Gross Motor Movements are  Fluid and Coordinated: No Fine Motor Movements are Fluid and Coordinated: No Motor  Motor Motor: Abnormal postural alignment and control Motor - Discharge Observations: tremors in bilateral UEs, kyphotic posture with standing, pesterior lean in sitting Mobility  Bed Mobility Bed Mobility: Rolling Left Rolling Left: 4: Min assist Rolling Left Details: Tactile cues for sequencing;Manual facilitation for weight shifting Left Sidelying to Sit: 3: Mod assist Sitting - Scoot to Edge of Bed: 2: Max assist Sit to Supine: 2: Max assist Sit to Supine - Details: Tactile cues for initiation;Manual facilitation for weight shifting Sit to Supine - Details (indicate cue type and reason): Supine to sit log rolling on flat bed with rails with max A for full roll and for side to sit upright EOB; sit > supine through log rolling with total A to lower trunk and bring bilat LE into bed; still presents with decreased trunk and pelvis ROM and dissociation  Trunk/Postural Assessment  Cervical Assessment Cervical Assessment: Exceptions to [facility] (significant forward head posture, limited rotation, ext) Cervical AROM Overall Cervical AROM Comments: Pt sits with cervical flexion  Thoracic Assessment Thoracic Assessment:  (Rests in kyphosis) Lumbar Assessment Lumbar Assessment:  (lumbar kyphosis, no ability to perform lordosis) Postural Control Postural Control: Deficits on evaluation Postural Limitations: Multiple back surgeries, limited spinal, rib and pelvic ROM and dissociation. Sits with posterior pelvic tilt and posterior lean, L trunk shortening, R elongation; unable to hold trunk in upight position in standing  Balance Static  Sitting Balance Static Sitting - Balance Support: Right upper extremity supported;Left upper extremity supported Static Sitting - Level of Assistance: 5: Stand by assistance Static Sitting - Comment/# of Minutes: for approximately 5 mins Dynamic Sitting Balance Dynamic Sitting - Balance Support: No upper extremity supported Dynamic Sitting - Level of Assistance: 3: Mod assist Static Standing Balance Static Standing - Balance Support: Right upper extremity supported;Left upper extremity supported Static Standing - Level of Assistance: 1: +2 Total assist;Other (comment) (Pt 20%) Extremity/Trunk Assessment RUE Assessment RUE Assessment: Within Functional Limits (Pt with decreased FM coordination secondary to tremors) LUE Assessment LUE Assessment: Within Functional Limits (Pt with decreased FM coordination secondary to tremors.)  See FIM for current functional status  Jaise Moser 06/27/2011, 5:18 PM

## 2011-06-27 NOTE — Progress Notes (Addendum)
Speech Language Pathology Daily Session Note & Discharge Summary  Patient Details  Name: John [name] MRN: [id number] Date of Birth: December 08, 1940  Today's Date: 06/27/2011 Time: 4098-1191 Time Calculation (min): 30 min  Short Term Goals: Week 3: SLP Short Term Goal 1 (Week 3): Pt will demonstrate sustained attention to a functional task for 15 minutes with Mod A verbal cues for redirection SLP Short Term Goal 2 (Week 3): Pt will demonstrate functional problem solving with familiar tasks with Min A verbal cues. SLP Short Term Goal 3 (Week 3): Patient will comsume regular textures and thin liquids without oberved s/s of aspiration and moderate assist semantic cues to utilize strategies.  Skilled Therapeutic Interventions: Session focused on safety with self feeding and patient consumed regular textures and thin liquids with cough x1; SLP facilitated session with supervision semantic cues for carryover of slow pace with self feeding as well as moderate assist semantic cues to initiate tasks and demonstrate basic problem solving.   Daily Session Precautions/Restrictions    FIM:  Comprehension Comprehension Mode: Auditory Comprehension: 4-Understands basic 75 - 89% of the time/requires cueing 10 - 24% of the time Expression Expression Mode: Verbal Expression: 5-Expresses basic 90% of the time/requires cueing < 10% of the time. Social Interaction Social Interaction: 4-Interacts appropriately 75 - 89% of the time - Needs redirection for appropriate language or to initiate interaction. Problem Solving Problem Solving: 3-Solves basic 50 - 74% of the time/requires cueing 25 - 49% of the time Memory Memory: 3-Recognizes or recalls 50 - 74% of the time/requires cueing 25 - 49% of the time FIM - Eating Eating Activity: 5: Supervision/cues;5: Set-up assist for open containers General    Pain Pain Assessment Pain Assessment: 0-10 Pain Score:   7 Pain Type: Chronic pain Pain Location:  Back Pain Orientation: Lower Pain Descriptors: Aching Pain Onset: Gradual Patients Stated Pain Goal: 3 Pain Intervention(s): RN made aware Multiple Pain Sites: No  Therapy/Group: Individual Therapy    Speech Language Pathology Discharge Summary  Patient Details  Name: John [name] MRN: [id number] Date of Birth: [birth year]  Today's Date: 06/27/2011  Patient has met 5 of 6 long term goals due to gains in orientation, attention, problem solving and dysphagia.  Patient to discharge at [facility] Assist level.  Patient's care partner requires assistance to provide the necessary physical and cognitive assistance at discharge.  Reasons goals not met: patient's discharge plan changed to SNF placement and as a result his discharge was moved up.   Recommendation:  Patient will benefit from ongoing skilled SLP services in skilled nursing facility setting to continue to advance functional skills in the area of maximize cognitive function and overall independence and reduce care partner burden.  Equipment: none  Reasons for discharge: discharge from hospital  Patient/family agrees with progress made and goals achieved: Yes  See FIM for current functional status  Charlane Ferretti., CCC-SLP [phone]  Lekeisha Arenas 06/27/2011, 9:21 AM

## 2011-06-28 LAB — GLUCOSE, CAPILLARY
Glucose-Capillary: 141 mg/dL — ABNORMAL HIGH (ref 70–99)
Glucose-Capillary: 186 mg/dL — ABNORMAL HIGH (ref 70–99)

## 2011-06-28 NOTE — Progress Notes (Signed)
Social Work Patient ID: John [name], male   DOB: Apr 10, 1940, 71 y.o.   MRN: [id number] Bed offer from [facility] and [facility] wife toured both and decided [facility]. Pt is aware and agreeable to this. Plan to transfer today at 11:00 via ambulance due to pt's need for O2.  Wife realizes pt needs longer rehab and time to heal. Will gather paperwork and wife to go over this am to complete paperwork.  Team and MD aware and medically stable for transfer today.

## 2011-06-28 NOTE — Progress Notes (Signed)
Patient ID: John [name], male   DOB: September 25, 1940, 71 y.o.   MRN: [id number]   Subjective: Oriented to person and [facility], recognizes me as MD "I have diarrhea"  Review of records shows 2 formed or loose stools per day No abd pain, N/V Objective: RN vitals reviewed  Physical Exam Gen NAD Mood/affect approp Cor:  RRR no Murmur Lungs:  Clear Abd:  +BS soft NT Skin:  Excoriation perianal mild/mod Ext:  No C/C/E Motors: 5/5 in UEs, 4/5 in LEs except 3-3/5  B ankle, poor truncal control. Intentional tremor Mood/affect- no agitation, follows commands,     Studies/Results: No results found.   Assessment: 1. Functional deficits secondary to Lumbar radiculopathy s/p L5-S1 decompression with muliple post op complications stable for D/C to SNF Plan: 1. lumbar HNP with radiculopathy. Status post laminotomies and facetotomies with postoperative respiratory failure and anoxia  2. Mood/depression/anxiety. Reduce Seroquel due to somnolence, cont Lamictal and vilazodone. C   3. DVT Prophylaxis/Anticoagulation: SCDs. Monitor for deep vein thrombosis  4. Pain Management:oxycodone IR 5 mg every 6 hours as needed pain  5. History of renal transplant 2002. Followup renal services as needed.  Latest creatinine reviewed Continue CellCept, Prograf and prednisone 5 mg daily  6. COPD/status post thoracentesis 06/01/2011. Followup pulmonary services as needed. Continue respiratory therapy/Advair/Atrovent/Proventil. Patient with chronic oxygen therapy. Check oxygen saturations every shift  7. Diabetes mellitus. Latest hemoglobin A1c of 6.7. Lantus insulin 15 units daily.CBGs well controlled Patient with insulin pump prior to admission. Increase Lantus but D/c noon and supper novalog standing orders 8. Dysphagia/decreased nutritional storage. Followup speech therapy. Monitor for aspiration. Obtain dietary consult  9. Parkinson's disease. Sinemet 25-100 3 times a day  10. 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 Continue aspirin    11. 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  . 12.  Urinary retention foley,        Length of stay, days: 18  John [name] E ,  06/26/2011, 7:43 AM   06/26/2011, 7:43 AM

## 2011-06-28 NOTE — Progress Notes (Signed)
Patient discharged to [facility]) at 1620 via [facility]. Report called to [place], Charity fundraiser at [place]. John [name]

## 2011-06-28 NOTE — Progress Notes (Signed)
Social Work Patient ID: John [name], male   DOB: 1941/01/08, 71 y.o.   MRN: [id number] Carla-Admission Coordinator from [facility] will be here at 2:00 pm to meet with wife and complete admission paperwork. Once finished can arrange ambulance to transfer pt to [facility].  Gracie-RN aware.

## 2011-06-30 LAB — FUNGUS CULTURE W SMEAR: Fungal Smear: NONE SEEN

## 2011-08-09 ENCOUNTER — Ambulatory Visit (INDEPENDENT_AMBULATORY_CARE_PROVIDER_SITE_OTHER): Payer: Medicare Other | Admitting: Critical Care Medicine

## 2011-08-09 ENCOUNTER — Telehealth: Payer: Self-pay | Admitting: Critical Care Medicine

## 2011-08-09 ENCOUNTER — Encounter: Payer: Self-pay | Admitting: Critical Care Medicine

## 2011-08-09 VITALS — BP 112/68 | HR 85 | Temp 98.5°F | Ht 73.0 in

## 2011-08-09 DIAGNOSIS — J449 Chronic obstructive pulmonary disease, unspecified: Secondary | ICD-10-CM

## 2011-08-09 MED ORDER — PANTOPRAZOLE SODIUM 40 MG PO TBEC: 40.0000 mg | DELAYED_RELEASE_TABLET | Freq: Every day | ORAL | Status: DC

## 2011-08-09 MED ORDER — AZITHROMYCIN 250 MG PO TABS: 250.0000 mg | ORAL_TABLET | Freq: Every day | ORAL | Status: AC

## 2011-08-09 NOTE — Progress Notes (Signed)
Subjective:    Patient ID: John [name], male    DOB: 1940-04-18, 71 y.o.   MRN: [id number]  HPI 71 y.o.  with history of renal transplant on chronic immunosuppressants, DM-insulin dependent, HTN, , Chronic back pain and CAD s/p CABG . Patient had a long and complicated hospital admission in 1/11 s/p CABG x 5.   Post-op course was complicated by pulmonary edema requiring intubation, renal failure requiring CVVH, and atrial fibrillation. He was in the hospital about 7 weeks. In 11/11, patient had right SFA and popliteal atherectomy that was complicated by a groin pseudoaneurysm. Hx of orthostatic hypotension, ShyDrager, and severe tremor followed by [facility]      08/09/2011 Pt with RA Sats 86%  Pt needing recert for portable oxygen therapy. Pt had back surgery.  Had inpatient rehab in April 2013 for three weeks then [facility] May 8th. Now no real cough, pt will have some mucus to raise .  Mucus comes and goes. Mucus now beige. No chest pain. No decline in breathing.  Pt does well with rehab.   Past Medical History  Diagnosis Date  . CAD (coronary artery disease)     a. myoview 1/11: lg inf fixed defect;   b. cath 1/11: 3v CAD,  c. s/p CABG 1/11 c/b acute renal failure and AFib  . Atrial fibrillation   . Chronic diastolic heart failure     a. echo 4/11: EF 45-50%, mod LVH, LAE, inf and septal HK  . First degree AV block   . Orthostatic hypotension     pyridostigmine  . History of renal transplant   . DM2 (diabetes mellitus, type 2)   . Diabetic gastroparesis   . HTN (hypertension)   . HLD (hyperlipidemia)   . PAD (peripheral artery disease)     s/p R SFA-Pop atherectomy 11/11  . Prostate cancer   . ED (erectile dysfunction)   . DDD (degenerative disc disease), lumbar   . Tremor   . Obesity   . Low testosterone   . Chronic kidney disease     s/p renal transplant 2002  . Pleural effusion, right     loculated  . COPD (chronic obstructive pulmonary disease)   . Anxiety     . Diabetes mellitus     insulin pump     Family History  Problem Relation Age of Onset  . Coronary artery disease Mother   . Valvular heart disease Father   . Hepatitis      sibling  . Coronary artery disease Sister   . Heart attack Sister   . Tuberculosis Father      History   Social History  . Marital Status: Married    Spouse Name: N/A    Number of Children: 3  . Years of Education: N/A   Occupational History  . retired Clinical research associate    Social History Main Topics  . Smoking status: Former Smoker -- 2.0 packs/day for 20 years    Types: Cigarettes    Quit date: 02/20/1978  . Smokeless tobacco: Never Used   Comment: smoked 1 ppd, stopped 1980  . Alcohol Use: No  . Drug Use: No  . Sexually Active: Not on file   Other Topics Concern  . Not on file   Social History Narrative  . No narrative on file     No Known Allergies   Outpatient Prescriptions Prior to Visit  Medication Sig Dispense Refill  . atorvastatin (LIPITOR) 40 MG tablet Take 20 mg by  mouth daily.       . calcium carbonate (OS-CAL - DOSED IN MG OF ELEMENTAL CALCIUM) 1250 MG tablet Take 2 tablets by mouth daily.      . carbidopa-levodopa (SINEMET CR) 25-100 MG per tablet Take by mouth. 1 tablet in the morning, 2 tablets at lunch, 1 tablet in the evening      . clobetasol (OLUX) 0.05 % topical foam Apply 1 application topically daily as needed. For head      . clopidogrel (PLAVIX) 75 MG tablet Take 75 mg by mouth daily.      Marland Kitchen ezetimibe (ZETIA) 10 MG tablet Take 10 mg by mouth daily.        . Ferrous Sulfate (IRON) 325 (65 FE) MG TABS Take 1 tablet by mouth daily.       . Fluticasone-Salmeterol (ADVAIR DISKUS) 250-50 MCG/DOSE AEPB Inhale 1 puff into the lungs every 12 (twelve) hours.        . isosorbide mononitrate (IMDUR) 60 MG 24 hr tablet Take 1 tablet (60 mg total) by mouth daily.  30 tablet  11  . LamoTRIgine (LAMICTAL ODT PO) Take 200 mg by mouth daily.       . Multiple Vitamin (MULITIVITAMIN WITH  MINERALS) TABS Take 1 tablet by mouth daily.      . mycophenolate (CELLCEPT) 500 MG tablet Take 500 mg by mouth 2 (two) times daily.       Marland Kitchen oxyCODONE (OXY IR/ROXICODONE) 5 MG immediate release tablet Take 1 tablet by mouth every 6 (six) hours as needed. For pain      . predniSONE (DELTASONE) 5 MG tablet Take 5 mg by mouth daily.        Marland Kitchen pyridostigmine (MESTINON) 60 MG tablet Take 60 mg by mouth 3 (three) times daily.      . tacrolimus (PROGRAF) 5 MG capsule Take 5 mg by mouth 2 (two) times daily.        . Tamsulosin HCl (FLOMAX) 0.4 MG CAPS Take 0.8 mg by mouth daily.       . temazepam (RESTORIL) 15 MG capsule Take 7.5 mg by mouth at bedtime as needed and may repeat dose one time if needed. For sleep      . tiotropium (SPIRIVA HANDIHALER) 18 MCG inhalation capsule Place 1 capsule (18 mcg total) into inhaler and inhale daily.  30 capsule  6  . Vilazodone HCl (VIIBRYD) 40 MG TABS Take 20 mg by mouth daily.       . furosemide (LASIX) 40 MG tablet Take 2 tablets in the am and 1 tablet in the pm daily by mouth  90 tablet  2  . pantoprazole (PROTONIX) 40 MG tablet Take 40 mg by mouth 2 (two) times daily.       Marland Kitchen albuterol (PROVENTIL HFA;VENTOLIN HFA) 108 (90 BASE) MCG/ACT inhaler Inhale 2 puffs into the lungs every 6 (six) hours as needed for wheezing.  1 Inhaler  0  . folic acid (FOLVITE) 800 MCG tablet Take 1 mg by mouth daily.       . Insulin Aspart (NOVOLOG ) Inject into the skin continuous. Via pump as directed       . magnesium oxide (MAG-OX) 400 MG tablet Take 800 mg by mouth 2 (two) times daily.       . risperiDONE (RISPERDAL) 2 MG tablet Take 1 tablet by mouth Daily.      . saxagliptin HCl (ONGLYZA) 5 MG TABS tablet Take 5 mg by mouth daily.       Marland Kitchen  vitamin B-12 (CYANOCOBALAMIN) 500 MCG tablet Take 500 mcg by mouth daily.      Marland Kitchen VITAMIN D, CHOLECALCIFEROL, PO Take 10,000 Units by mouth 3 (three) times a week. On Monday, Wednesday, and Friday         Review of Systems  Constitutional:    No  weight loss, night sweats,  Fevers, chills,  +fatigue, or  lassitude.  HEENT:   No headaches,  Difficulty swallowing,  Tooth/dental problems, or  Sore throat,                No sneezing, itching, ear ache, nasal congestion, post nasal drip,   CV:  No chest pain,  Orthopnea, PND, swelling in lower extremities,  , dizziness,     GI  No heartburn, indigestion, abdominal pain, nausea, vomiting, diarrhea, change in bowel habits, loss of appetite, bloody stools.   Resp: +shortness of breath with exertion or at rest.   Notes  excess mucus, notes  productive cough,  No coughing up of blood.  Notes  change in color of mucus.  No wheezing.  No chest wall deformity  Skin: no rash or lesions.  GU: no dysuria, change in color of urine, no urgency or frequency.  No flank pain, no hematuria   MS:  +joint pain   +chronic  back pain.  Psych:  No change in mood or affect. No depression or anxiety.  No memory loss.          Objective:   Physical Exam  Filed Vitals:   08/09/11 1458 08/09/11 1459  BP:  112/68  Pulse:  85  Temp:  98.5 F (36.9 C)  TempSrc:  Oral  Height:  6\' 1"  (1.854 m)  SpO2: 86% 96%    ZOX:WRUEAVW WM in no distress,  normal affect  ENT: No lesions,  mouth clear,  oropharynx clear, no postnasal drip  Neck: no JVD, no TMG, no carotid bruits  Lungs: No use of accessory muscles, no dullness to percussion diminshed BS in bases , exp wheezes , scattered rhonchi  Cardiovascular: RRR, heart sounds normal, no murmur or gallops, no peripheral edema  Abdomen: soft and NT, no HSM,  BS normal  Musculoskeletal: No deformities, no cyanosis or clubbing  Neuro: alert, non focal  Skin: echymoses       Assessment & Plan:   COPD (chronic obstructive pulmonary disease) Severe golds D. COPD with chronic hypoxemic respiratory failure Chronic bilateral pleural effusions with the lower lobe atelectasis Acute tracheobronchitis superimposed Plan Azithromycin for 5  days Reduce protonic to once daily given the patient's tendency to loose stools No change in bronchodilator therapy at this time      Updated Medication List Outpatient Encounter Prescriptions as of 08/09/2011  Medication Sig Dispense Refill  . acetaminophen (TYLENOL) 325 MG tablet Take 650 mg by mouth every 4 (four) hours as needed.      Marland Kitchen albuterol (PROVENTIL) (2.5 MG/3ML) 0.083% nebulizer solution Take 2.5 mg by nebulization 3 (three) times daily.      Marland Kitchen ALPRAZolam (XANAX) 0.25 MG tablet Take 0.25 mg by mouth 2 (two) times daily as needed.      Marland Kitchen aspirin 81 MG tablet Take 81 mg by mouth daily.      Marland Kitchen atorvastatin (LIPITOR) 40 MG tablet Take 20 mg by mouth daily.       . calcium carbonate (OS-CAL - DOSED IN MG OF ELEMENTAL CALCIUM) 1250 MG tablet Take 2 tablets by mouth daily.      Marland Kitchen  carbidopa-levodopa (SINEMET CR) 25-100 MG per tablet Take by mouth. 1 tablet in the morning, 2 tablets at lunch, 1 tablet in the evening      . clobetasol (OLUX) 0.05 % topical foam Apply 1 application topically daily as needed. For head      . clopidogrel (PLAVIX) 75 MG tablet Take 75 mg by mouth daily.      Marland Kitchen ezetimibe (ZETIA) 10 MG tablet Take 10 mg by mouth daily.        . feeding supplement (PRO-STAT SUGAR FREE 64) LIQD Take 30 mLs by mouth 2 (two) times daily.      . Ferrous Sulfate (IRON) 325 (65 FE) MG TABS Take 1 tablet by mouth daily.       . Fluticasone-Salmeterol (ADVAIR DISKUS) 250-50 MCG/DOSE AEPB Inhale 1 puff into the lungs every 12 (twelve) hours.        . folic acid (FOLVITE) 1 MG tablet Take 1 mg by mouth daily.      . furosemide (LASIX) 40 MG tablet Take 80 mg by mouth daily.      . insulin aspart (NOVOLOG) 100 UNIT/ML injection Inject 5 Units into the skin 3 (three) times daily before meals. If cbg is > 150      . insulin glargine (LANTUS) 100 UNIT/ML injection Inject 35 Units into the skin every evening.      . isosorbide mononitrate (IMDUR) 60 MG 24 hr tablet Take 1 tablet (60 mg  total) by mouth daily.  30 tablet  11  . LamoTRIgine (LAMICTAL ODT PO) Take 200 mg by mouth daily.       . Menthol-Zinc Oxide (CALMOSEPTINE) 0.44-20.625 % OINT Apply topically as directed. To buttocks      . Multiple Vitamin (MULITIVITAMIN WITH MINERALS) TABS Take 1 tablet by mouth daily.      . mycophenolate (CELLCEPT) 500 MG tablet Take 500 mg by mouth 2 (two) times daily.       Marland Kitchen oxyCODONE (OXY IR/ROXICODONE) 5 MG immediate release tablet Take 1 tablet by mouth every 6 (six) hours as needed. For pain      . pantoprazole (PROTONIX) 40 MG tablet Take 1 tablet (40 mg total) by mouth daily.      . predniSONE (DELTASONE) 5 MG tablet Take 5 mg by mouth daily.        Marland Kitchen pyridostigmine (MESTINON) 60 MG tablet Take 60 mg by mouth 3 (three) times daily.      . QUEtiapine (SEROQUEL) 25 MG tablet Take 25 mg by mouth 2 (two) times daily.      Marland Kitchen saccharomyces boulardii (FLORASTOR) 250 MG capsule Take 250 mg by mouth daily.      . tacrolimus (PROGRAF) 5 MG capsule Take 5 mg by mouth 2 (two) times daily.        . Tamsulosin HCl (FLOMAX) 0.4 MG CAPS Take 0.8 mg by mouth daily.       . temazepam (RESTORIL) 15 MG capsule Take 7.5 mg by mouth at bedtime as needed and may repeat dose one time if needed. For sleep      . tiotropium (SPIRIVA HANDIHALER) 18 MCG inhalation capsule Place 1 capsule (18 mcg total) into inhaler and inhale daily.  30 capsule  6  . Vilazodone HCl (VIIBRYD) 40 MG TABS Take 20 mg by mouth daily.       . vitamin C (ASCORBIC ACID) 500 MG tablet Take 500 mg by mouth 2 (two) times daily.      Marland Kitchen zinc sulfate  220 MG capsule Take 220 mg by mouth daily.      Marland Kitchen DISCONTD: furosemide (LASIX) 40 MG tablet Take 2 tablets in the am and 1 tablet in the pm daily by mouth  90 tablet  2  . DISCONTD: pantoprazole (PROTONIX) 40 MG tablet Take 40 mg by mouth 2 (two) times daily.       Marland Kitchen albuterol (PROVENTIL HFA;VENTOLIN HFA) 108 (90 BASE) MCG/ACT inhaler Inhale 2 puffs into the lungs every 6 (six) hours as needed  for wheezing.  1 Inhaler  0  . azithromycin (ZITHROMAX) 250 MG tablet Take 1 tablet (250 mg total) by mouth daily. Take two once then one daily until gone  6 each  0  . DISCONTD: folic acid (FOLVITE) 800 MCG tablet Take 1 mg by mouth daily.       Marland Kitchen DISCONTD: Insulin Aspart (NOVOLOG [place]) Inject into the skin continuous. Via pump as directed       . DISCONTD: magnesium oxide (MAG-OX) 400 MG tablet Take 800 mg by mouth 2 (two) times daily.       Marland Kitchen DISCONTD: risperiDONE (RISPERDAL) 2 MG tablet Take 1 tablet by mouth Daily.      Marland Kitchen DISCONTD: saxagliptin HCl (ONGLYZA) 5 MG TABS tablet Take 5 mg by mouth daily.       Marland Kitchen DISCONTD: vitamin B-12 (CYANOCOBALAMIN) 500 MCG tablet Take 500 mcg by mouth daily.      Marland Kitchen DISCONTD: VITAMIN D, CHOLECALCIFEROL, PO Take 10,000 Units by mouth 3 (three) times a week. On Monday, Wednesday, and Friday

## 2011-08-09 NOTE — Patient Instructions (Addendum)
Reduce protonix to one daily Take azithromycin 250mg  Take two once then one daily until gone No other medication changes Return 3 months

## 2011-08-09 NOTE — Assessment & Plan Note (Signed)
Severe golds D. COPD with chronic hypoxemic respiratory failure Chronic bilateral pleural effusions with the lower lobe atelectasis Acute tracheobronchitis superimposed Plan Azithromycin for 5 days Reduce protonic to once daily given the patient's tendency to loose stools No change in bronchodilator therapy at this time

## 2011-08-09 NOTE — Telephone Encounter (Signed)
Spoke with nurse-she is aware to have patient take Zpak until gone-5 day course.

## 2011-08-11 NOTE — Telephone Encounter (Signed)
This was done, pt had ov 08/09/11, documentation was done and faxed to [facility]. Kandice Hams

## 2011-08-16 ENCOUNTER — Encounter: Payer: Self-pay | Admitting: Cardiology

## 2011-08-16 ENCOUNTER — Ambulatory Visit (INDEPENDENT_AMBULATORY_CARE_PROVIDER_SITE_OTHER): Payer: Medicare Other | Admitting: Cardiology

## 2011-08-16 VITALS — BP 102/52 | HR 84 | Ht 73.0 in | Wt 190.0 lb

## 2011-08-16 DIAGNOSIS — I2510 Atherosclerotic heart disease of native coronary artery without angina pectoris: Secondary | ICD-10-CM

## 2011-08-16 DIAGNOSIS — I7389 Other specified peripheral vascular diseases: Secondary | ICD-10-CM

## 2011-08-16 DIAGNOSIS — J449 Chronic obstructive pulmonary disease, unspecified: Secondary | ICD-10-CM

## 2011-08-16 DIAGNOSIS — E7800 Pure hypercholesterolemia, unspecified: Secondary | ICD-10-CM

## 2011-08-16 DIAGNOSIS — I5032 Chronic diastolic (congestive) heart failure: Secondary | ICD-10-CM

## 2011-08-16 DIAGNOSIS — R0602 Shortness of breath: Secondary | ICD-10-CM

## 2011-08-16 DIAGNOSIS — I951 Orthostatic hypotension: Secondary | ICD-10-CM

## 2011-08-16 LAB — BASIC METABOLIC PANEL
CO2: 29 mEq/L (ref 19–32)
Calcium: 9.4 mg/dL (ref 8.4–10.5)
Creatinine, Ser: 0.7 mg/dL (ref 0.4–1.5)
Glucose, Bld: 155 mg/dL — ABNORMAL HIGH (ref 70–99)

## 2011-08-16 NOTE — Assessment & Plan Note (Signed)
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  This a small caliber vessel and not amenable to intervention.  He is doing well with no significant ischemic symptoms.  He will also continue ASA 81, statin, Imdur, Plavix.  No beta blocker or ACEI due to orthostatic hypotension.

## 2011-08-16 NOTE — Assessment & Plan Note (Addendum)
Per Dr. Clifton James.  No claudication.  I told him that he could hold off on his ABIs (due now) for now until he has recovered more.

## 2011-08-16 NOTE — Assessment & Plan Note (Signed)
Shy-Drager syndrome. Continue pyridostigmine. He is not having significant orthostatic symptoms at this time.    Continue to work aggressively with PT.

## 2011-08-16 NOTE — Assessment & Plan Note (Signed)
John [name] does not appear volume overloaded on exam.   I would continue the current dose of Lasix.  I will get a BMET and BNP today.

## 2011-08-16 NOTE — Patient Instructions (Addendum)
Your physician recommends that you have lab work today--BMET/BNP  Please give patient Restoril as needed and not every night unless requested by patient.  Your physician recommends that you schedule a follow-up appointment in: 1 month with Dr Shirlee Latch.

## 2011-08-16 NOTE — Progress Notes (Signed)
Patient ID: John [name], male   DOB: 1940-09-30, 71 y.o.   MRN: [id number] PCP: Dr. [name]  71 yo with history of renal transplant, DM, HTN, and CAD s/p CABG returns for followup.  Patient had a long and complicated hospital admission in 1/11.  Patient was admitted to [facility] at that time with a UTI and pulmonary edema.  Troponin was noted to be elevated and the patient had a nuclear scan with a large fixed inferior defect.  He ended up having a cardiac catheterization showing severe disease involving the LM, LAD, ramus, CFX and RCA.  He had CABG x 5.  Post-op course was complicated by pulmonary edema requiring intubation, renal failure requiring CVVH, and atrial fibrillation.  He was in the hospital about 7 weeks.  In 11/11, patient had right SFA and popliteal atherectomy that was complicated by a left groin pseudoaneurysm.   Patient developed increased dyspnea in 2012.  Lexiscan myoview in 5/12 showed a partially reversible inferior defect.  Echo 6/12 showed preserved EF of 60%, moderate LVH, normal RV, and very mild AS.  I increased his Lasix, with some weight loss.  He saw Dr. Delford Field and had right-sided thoracentesis with removal of 500 cc of fluid.  Cytology was negative for malignancy.  In-office FEV1 was quite low (35% predicted) suggesting significant COPD, so he was started on inhalers.  Given recurrence of orthostatic hypotension symptoms, I also increased his pyridostigmine.   Last fall, John [name] reported worsening orthostatic-type lightheadedness and worsening exertional dyspnea.  I decided at that point to take him for right and left heart cath in 10/12.  On Lasix, his right and left heart filling pressures were not significantly elevated.  His bypass grafts were patent but he had an ungrafted PDA with 95% stenosis (small vessel that would not be amenable to PCI).  As this was a possible source of ischemia and could potentially cause exertional dyspnea (his anginal equivalent was dyspnea in  the past), I started him on Imdur 30 mg daily.   I also took him off lisinopril due to worsening orthostatic symptoms.    In 3/13, John John [name] had back surgery.  Post-operative course was complicated by fluid retention and respiratory failure requiring intubation.  He developed PNA and delirium.  He had a prolonged hospitalization and is now in rehab at [facility].  He is now on home oxygen.  He is able to walk short distances with a walker.  His legs are weak s/p long hospitalization.  He still has a foley catheter and has had some trouble with bowel incontinence.  He is not lightheaded with standing and denies dyspnea (though he is not very active yet).    ECG: NSR, 1st degree AV block, LAFB  Labs (9/11): LDL 71, HDL 45 Labs (12/11): K 4.9, creatinine 0.9, HCT 37.1  Labs (5/12): K 4.9, creatinine 0.9, BNP 157, HCT 36.4 Labs (5/12): LDL 42, HDL 44, K 3.7, creatinine 1.1, BNP 105 Labs (6/12): K 5, creatinine 1.1, TSH normal Labs (10/12): LDL 53, HDL 47, K 4.3, creatinine 4.09, BNP 197 Labs (12/12): K 4.1, creatinine 1.3 Labs (1/13): K 3.6, creatinine 0.96 Labs (5/13): K 4, creatinine 0.83  Allergies (verified):  No Known Drug Allergies  Past Medical History: 1. Diabetes mellitus 2. CKD s/p renal transplant 2002 3. HTN 4. PAD: Recent atherectomy right SFA and right popliteal on 01/12/10.  Complicated by left groin pseudoaneurysm.  5. Lumbar disc disease with low back pain 6. Diabetic gastroparesis  7. Prostate cancer s/p seed implantation in 1/06.  8. Erectile dysfunction 9. Tremor 10.  Obesity 11.  Diastolic CHF: Echo (6/12) showed EF 60%, moderate LV hypertrophy, very mild AS, normal RV size and systolic function.  Echo (3/13) with EF 55%, possible inferior hypokinesis, aortic sclerosis, mildly dilated RV with mildly decreased RV systolic function.  12.  CAD: Lexiscan myoview (2/11) with EF 44%,  fixed inferior defect.  Cath (2/11) with 60% distal left main, 90% ostial LAD involving  D1, 95% ostial large ramus, probable significant ostial CFX stenosis, 90% PDA, 70% PLV.  Paitent had CABG with LIMA-LAD, SVG-D, sequential SVG-ramus and OM2, SVG-PLV.  Lexiscan myoview (5/12) with EF 59%, partially reversible inferior defect.  LHC/RHC (10/12): RA 6, PA 38/12, PCWP 9, CI 2.8; SVG-OM and ramus patent, LIMA-LAD patent, SVG-D1 patent with 60% stenosis at touchdown, SVG-PLV patent; there was a 95% PDA stenosis (seen on study prior to CABG) that was ungrafted.  The PDA was a small caliber vessel and not well-suited to PCI.  This could be a source of exertional symptoms.   13.  Atrial fibrillation post-op CABG 14.  Shy-Drager syndrome with orthostatic hypotension:  improvement with pyridostigmine.  15.  Low testosterone 16.  Loculated right>left pleural effusion: Right thoracentesis (6/12), cytology negative for malignancy.  17.  COPD: Suspected.  FEV1 35% (in-office test).   18.  Carotid dopplers (6/12): 0-39% bilateral ICA stenosis.  19.  Depression 20.  Lumbar disc disease 21.  Heart block: Patient has had profound 1st degree AV block on ECG.  Holter (11/12) showed NSR, 1st degree heart block, occasional short runs of type I 2nd degree heart block.   Family History: Noncontributory  Social History: Married, retired Clinical research associate, lives in [place].  Prior smoker.  Review of Systems        All systems reviewed and negative except as per HPI.   Current Outpatient Prescriptions  Medication Sig Dispense Refill  . acetaminophen (TYLENOL) 325 MG tablet Take 650 mg by mouth every 4 (four) hours as needed.      Marland Kitchen albuterol (PROVENTIL HFA;VENTOLIN HFA) 108 (90 BASE) MCG/ACT inhaler Inhale 2 puffs into the lungs every 6 (six) hours as needed for wheezing.  1 Inhaler  0  . albuterol (PROVENTIL) (2.5 MG/3ML) 0.083% nebulizer solution Take 2.5 mg by nebulization 3 (three) times daily.      Marland Kitchen ALPRAZolam (XANAX) 0.25 MG tablet Take 0.25 mg by mouth 2 (two) times daily as needed.      Marland Kitchen aspirin 81  MG tablet Take 81 mg by mouth daily.      Marland Kitchen atorvastatin (LIPITOR) 40 MG tablet Take 20 mg by mouth daily.       . calcium carbonate (OS-CAL - DOSED IN MG OF ELEMENTAL CALCIUM) 1250 MG tablet Take 2 tablets by mouth daily.      . carbidopa-levodopa (SINEMET CR) 25-100 MG per tablet Take by mouth. 1 tablet in the morning, 2 tablets at lunch, 1 tablet in the evening      . clobetasol (OLUX) 0.05 % topical foam Apply 1 application topically daily as needed. For head      . clopidogrel (PLAVIX) 75 MG tablet Take 75 mg by mouth daily.      Marland Kitchen ezetimibe (ZETIA) 10 MG tablet Take 10 mg by mouth daily.        . feeding supplement (PRO-STAT SUGAR FREE 64) LIQD Take 30 mLs by mouth 2 (two) times daily.      . Ferrous  Sulfate (IRON) 325 (65 FE) MG TABS Take 1 tablet by mouth daily.       . Fluticasone-Salmeterol (ADVAIR DISKUS) 250-50 MCG/DOSE AEPB Inhale 1 puff into the lungs every 12 (twelve) hours.        . folic acid (FOLVITE) 1 MG tablet Take 1 mg by mouth daily.      . furosemide (LASIX) 40 MG tablet Take 80 mg by mouth daily.      . insulin aspart (NOVOLOG) 100 UNIT/ML injection Inject 5 Units into the skin 3 (three) times daily before meals. If cbg is > 150      . insulin glargine (LANTUS) 100 UNIT/ML injection Inject 35 Units into the skin every evening.      . isosorbide mononitrate (IMDUR) 60 MG 24 hr tablet Take 1 tablet (60 mg total) by mouth daily.  30 tablet  11  . LamoTRIgine (LAMICTAL ODT PO) Take 200 mg by mouth daily.       . Menthol-Zinc Oxide (CALMOSEPTINE) 0.44-20.625 % OINT Apply topically as directed. To buttocks      . Multiple Vitamin (MULITIVITAMIN WITH MINERALS) TABS Take 1 tablet by mouth daily.      . mycophenolate (CELLCEPT) 500 MG tablet Take 500 mg by mouth 2 (two) times daily.       Marland Kitchen oxyCODONE (OXY IR/ROXICODONE) 5 MG immediate release tablet Take 1 tablet by mouth every 6 (six) hours as needed. For pain      . pantoprazole (PROTONIX) 40 MG tablet Take 1 tablet (40 mg  total) by mouth daily.      . predniSONE (DELTASONE) 5 MG tablet Take 5 mg by mouth daily.        Marland Kitchen pyridostigmine (MESTINON) 60 MG tablet Take 60 mg by mouth 3 (three) times daily.      . QUEtiapine (SEROQUEL) 25 MG tablet Take 25 mg by mouth 2 (two) times daily.      Marland Kitchen saccharomyces boulardii (FLORASTOR) 250 MG capsule Take 250 mg by mouth daily.      . tacrolimus (PROGRAF) 5 MG capsule Take 5 mg by mouth 2 (two) times daily.        . Tamsulosin HCl (FLOMAX) 0.4 MG CAPS Take 0.8 mg by mouth daily.       . temazepam (RESTORIL) 15 MG capsule Take 7.5 mg by mouth at bedtime as needed and may repeat dose one time if needed. For sleep      . tiotropium (SPIRIVA HANDIHALER) 18 MCG inhalation capsule Place 1 capsule (18 mcg total) into inhaler and inhale daily.  30 capsule  6  . Vilazodone HCl (VIIBRYD) 40 MG TABS Take 20 mg by mouth daily.       . vitamin C (ASCORBIC ACID) 500 MG tablet Take 500 mg by mouth 2 (two) times daily.      Marland Kitchen zinc sulfate 220 MG capsule Take 220 mg by mouth daily.      Marland Kitchen DISCONTD: Calcium Carb-Cholecalciferol (CALCIUM 1000 + D PO) Take 1 tablet by mouth daily.        BP 102/52  Pulse 84  Ht 6\' 1"  (1.854 m)  Wt 86.183 kg (190 lb)  BMI 25.07 kg/m2 General: NAD, overweight Neck: No JVD, no thyromegaly or thyroid nodule.  Lungs: Distant breath sounds bilaterally. CV: Nondisplaced PMI.  Heart regular S1/S2, no S3/S4, 2/6 early SEM RUSB.  No edema.  Normal pedal pulses.  Right carotid bruit.  Abdomen: Soft, nontender, no hepatosplenomegaly, no distention.  Neurologic: Alert and  oriented x 3.  Psych: Depressed affect Extremities: No clubbing or cyanosis.

## 2011-08-16 NOTE — Assessment & Plan Note (Signed)
Check lipids/LFTs next appointment.

## 2011-08-16 NOTE — Assessment & Plan Note (Addendum)
Severe, now on home oxygen.  Followed by Dr. Delford Field.

## 2011-08-21 ENCOUNTER — Telehealth: Payer: Self-pay | Admitting: Critical Care Medicine

## 2011-08-21 NOTE — Telephone Encounter (Signed)
Please advise John [name], thanks 

## 2011-08-22 NOTE — Telephone Encounter (Signed)
Spoke with Erie Noe @ [facility] this am, cmn has been signed and faxed this am. .Kandice Hams

## 2011-08-23 NOTE — Addendum Note (Signed)
Addended by: Early Chars on: 08/23/2011 08:45 AM   Modules accepted: Orders

## 2011-09-01 ENCOUNTER — Ambulatory Visit: Payer: Medicare Other | Admitting: Critical Care Medicine

## 2011-09-21 ENCOUNTER — Encounter: Payer: Self-pay | Admitting: *Deleted

## 2011-09-21 ENCOUNTER — Ambulatory Visit (INDEPENDENT_AMBULATORY_CARE_PROVIDER_SITE_OTHER): Payer: Medicare Other | Admitting: Cardiology

## 2011-09-21 ENCOUNTER — Encounter: Payer: Self-pay | Admitting: Cardiology

## 2011-09-21 VITALS — BP 128/62 | HR 80 | Ht 73.0 in | Wt 192.0 lb

## 2011-09-21 DIAGNOSIS — J449 Chronic obstructive pulmonary disease, unspecified: Secondary | ICD-10-CM

## 2011-09-21 DIAGNOSIS — I5032 Chronic diastolic (congestive) heart failure: Secondary | ICD-10-CM

## 2011-09-21 DIAGNOSIS — I25810 Atherosclerosis of coronary artery bypass graft(s) without angina pectoris: Secondary | ICD-10-CM

## 2011-09-21 DIAGNOSIS — I951 Orthostatic hypotension: Secondary | ICD-10-CM

## 2011-09-21 DIAGNOSIS — E785 Hyperlipidemia, unspecified: Secondary | ICD-10-CM

## 2011-09-21 DIAGNOSIS — I7389 Other specified peripheral vascular diseases: Secondary | ICD-10-CM

## 2011-09-21 DIAGNOSIS — R339 Retention of urine, unspecified: Secondary | ICD-10-CM

## 2011-09-21 NOTE — Patient Instructions (Addendum)
Your physician recommends that you schedule a follow-up appointment in: 3 months.   We will call Dr. Earlene Plater' office tomorrow when they open to help you arrange an appt in the next 2 weeks.

## 2011-09-22 ENCOUNTER — Telehealth: Payer: Self-pay | Admitting: *Deleted

## 2011-09-22 DIAGNOSIS — R339 Retention of urine, unspecified: Secondary | ICD-10-CM | POA: Insufficient documentation

## 2011-09-22 NOTE — Assessment & Plan Note (Signed)
Per Dr. Clifton James.  No claudication.  I told him that he could hold off on his ABIs for now until he has recovered more.

## 2011-09-22 NOTE — Telephone Encounter (Signed)
Spoke with pt's wife. She is aware I was unable to get sooner appt for pt with Dr Earlene Plater.

## 2011-09-22 NOTE — Assessment & Plan Note (Addendum)
Shy-Drager syndrome. Continue pyridostigmine. He is not having significant orthostatic symptoms at this time.

## 2011-09-22 NOTE — Progress Notes (Signed)
Patient ID: DELVON CHIPPS, male   DOB: [birth year], 71 y.o.   MRN: [id number] PCP: Dr. [name]  71 yo with history of renal transplant, DM, HTN, and CAD s/p CABG returns for followup.  Patient had a long and complicated hospital admission in 1/11.  Patient was admitted to [facility] at that time with a UTI and pulmonary edema.  Troponin was noted to be elevated and the patient had a nuclear scan with a large fixed inferior defect.  He ended up having a cardiac catheterization showing severe disease involving the LM, LAD, ramus, CFX and RCA.  He had CABG x 5.  Post-op course was complicated by pulmonary edema requiring intubation, renal failure requiring CVVH, and atrial fibrillation.  He was in the hospital about 7 weeks.  In 11/11, patient had right SFA and popliteal atherectomy that was complicated by a left groin pseudoaneurysm.   Patient developed increased dyspnea in 2012.  Lexiscan myoview in 5/12 showed a partially reversible inferior defect.  Echo 6/12 showed preserved EF of 60%, moderate LVH, normal RV, and very mild AS.  I increased his Lasix, with some weight loss.  He saw Dr. Delford Field and had right-sided thoracentesis with removal of 500 cc of fluid.  Cytology was negative for malignancy.  In-office FEV1 was quite low (35% predicted) suggesting significant COPD, so he was started on inhalers.  Given recurrence of orthostatic hypotension symptoms, I also increased his pyridostigmine.   Last fall, Mr Doro reported worsening orthostatic-type lightheadedness and worsening exertional dyspnea.  I decided at that point to take him for right and left heart cath in 10/12.  On Lasix, his right and left heart filling pressures were not significantly elevated.  His bypass grafts were patent but he had an ungrafted PDA with 95% stenosis (small vessel that would not be amenable to PCI).  As this was a possible source of ischemia and could potentially cause exertional dyspnea (his anginal equivalent was dyspnea in  the past), I started him on Imdur 30 mg daily.   I also took him off lisinopril due to worsening orthostatic symptoms.    In 3/13, Mr Moxley had back surgery.  Post-operative course was complicated by fluid retention and respiratory failure requiring intubation.  He developed PNA and delirium.  He had a prolonged hospitalization and is now in rehab at [facility].  He is now on home oxygen.  He is walking better now with PT, using a walker at all times.  He says that he is not dypsneic walking, he is just still weak.  No chest pain.  He still has a foley catheter in place but his bowel continence has improved.  He is not lightheaded with standing and denies dyspnea (though he is not very active yet).   We checked resting oxygen saturation today in the office, it was 95-96%.  No claudication-type symptoms.  Back is no longer hurting.   Labs (9/11): LDL 71, HDL 45 Labs (12/11): K 4.9, creatinine 0.9, HCT 37.1  Labs (5/12): K 4.9, creatinine 0.9, BNP 157, HCT 36.4 Labs (5/12): LDL 42, HDL 44, K 3.7, creatinine 1.1, BNP 105 Labs (6/12): K 5, creatinine 1.1, TSH normal Labs (10/12): LDL 53, HDL 47, K 4.3, creatinine 7.82, BNP 197 Labs (12/12): K 4.1, creatinine 1.3 Labs (1/13): K 3.6, creatinine 0.96 Labs (5/13): K 4, creatinine 0.83 Labs (7/13): creatinine 0.7  Allergies (verified):  No Known Drug Allergies  Past Medical History: 1. Diabetes mellitus 2. CKD s/p renal transplant  2002 3. HTN 4. PAD: Recent atherectomy right SFA and right popliteal on 01/12/10.  Complicated by left groin pseudoaneurysm.  5. Lumbar disc disease with low back pain 6. Diabetic gastroparesis 7. Prostate cancer s/p seed implantation in 1/06.  8. Erectile dysfunction 9. Tremor 10.  Obesity 11.  Diastolic CHF: Echo (6/12) showed EF 60%, moderate LV hypertrophy, very mild AS, normal RV size and systolic function.  Echo (3/13) with EF 55%, possible inferior hypokinesis, aortic sclerosis, mildly dilated RV with mildly  decreased RV systolic function.  12.  CAD: Lexiscan myoview (2/11) with EF 44%,  fixed inferior defect.  Cath (2/11) with 60% distal left main, 90% ostial LAD involving D1, 95% ostial large ramus, probable significant ostial CFX stenosis, 90% PDA, 70% PLV.  Paitent had CABG with LIMA-LAD, SVG-D, sequential SVG-ramus and OM2, SVG-PLV.  Lexiscan myoview (5/12) with EF 59%, partially reversible inferior defect.  LHC/RHC (10/12): RA 6, PA 38/12, PCWP 9, CI 2.8; SVG-OM and ramus patent, LIMA-LAD patent, SVG-D1 patent with 60% stenosis at touchdown, SVG-PLV patent; there was a 95% PDA stenosis (seen on study prior to CABG) that was ungrafted.  The PDA was a small caliber vessel and not well-suited to PCI.  This could be a source of exertional symptoms.   13.  Atrial fibrillation post-op CABG 14.  Shy-Drager syndrome with orthostatic hypotension:  improvement with pyridostigmine.  15.  Low testosterone 16.  Loculated right>left pleural effusion: Right thoracentesis (6/12), cytology negative for malignancy.  17.  COPD: Suspected.  FEV1 35% (in-office test).   18.  Carotid dopplers (6/12): 0-39% bilateral ICA stenosis.  19.  Depression 20.  Lumbar disc disease 21.  Heart block: Patient has had profound 1st degree AV block on ECG.  Holter (11/12) showed NSR, 1st degree heart block, occasional short runs of type I 2nd degree heart block.   Family History: Noncontributory  Social History: Married, retired Clinical research associate, lives in [place].  Prior smoker.  ROS: All systems reviewed and negative except as per HPI.    Current Outpatient Prescriptions  Medication Sig Dispense Refill  . acetaminophen (TYLENOL) 325 MG tablet Take 650 mg by mouth every 4 (four) hours as needed.      Marland Kitchen albuterol (PROVENTIL) (2.5 MG/3ML) 0.083% nebulizer solution Take 2.5 mg by nebulization 3 (three) times daily.      Marland Kitchen ALPRAZolam (XANAX) 0.25 MG tablet Take 0.25 mg by mouth 2 (two) times daily as needed.      Marland Kitchen aspirin 81 MG tablet  Take 81 mg by mouth daily.      Marland Kitchen atorvastatin (LIPITOR) 40 MG tablet Take 20 mg by mouth daily.       . calcium carbonate (OS-CAL - DOSED IN MG OF ELEMENTAL CALCIUM) 1250 MG tablet Take 2 tablets by mouth daily.      . carbidopa-levodopa (SINEMET CR) 50-200 MG per tablet Take 1/2 tablet by mouth every morning *DO NOT CRUSH OR CHEW*, take 1 tablet twice before lunch and dinner      . clobetasol (OLUX) 0.05 % topical foam Apply 1 application topically daily as needed. For head      . clopidogrel (PLAVIX) 75 MG tablet Take 75 mg by mouth daily.      Marland Kitchen ezetimibe (ZETIA) 10 MG tablet Take 10 mg by mouth daily.        . feeding supplement (PRO-STAT SUGAR FREE 64) LIQD Take 30 mLs by mouth 2 (two) times daily.      . Ferrous Sulfate (IRON) 325 (  65 FE) MG TABS Take 1 tablet by mouth daily.       . Fluticasone-Salmeterol (ADVAIR DISKUS) 250-50 MCG/DOSE AEPB Inhale 1 puff into the lungs every 12 (twelve) hours.        . folic acid (FOLVITE) 1 MG tablet Take 1 mg by mouth daily.      . furosemide (LASIX) 40 MG tablet Take 80 mg by mouth daily.      . insulin aspart (NOVOLOG) 100 UNIT/ML injection Inject 5 Units into the skin 3 (three) times daily before meals. If cbg is > 150      . insulin glargine (LANTUS) 100 UNIT/ML injection Inject 35 Units into the skin every evening.      . isosorbide mononitrate (IMDUR) 60 MG 24 hr tablet Take 1 tablet (60 mg total) by mouth daily.  30 tablet  11  . LamoTRIgine (LAMICTAL ODT PO) Take 200 mg by mouth daily.       . Menthol-Zinc Oxide (CALMOSEPTINE) 0.44-20.625 % OINT Apply topically as directed. To buttocks      . Multiple Vitamin (MULITIVITAMIN WITH MINERALS) TABS Take 1 tablet by mouth daily.      . mycophenolate (CELLCEPT) 500 MG tablet Take 500 mg by mouth 2 (two) times daily.       Marland Kitchen oxyCODONE (OXY IR/ROXICODONE) 5 MG immediate release tablet Take 1 tablet by mouth every 6 (six) hours as needed. For pain      . pantoprazole (PROTONIX) 40 MG tablet Take 1 tablet  (40 mg total) by mouth daily.      . predniSONE (DELTASONE) 5 MG tablet Take 5 mg by mouth daily.        Marland Kitchen pyridostigmine (MESTINON) 60 MG tablet Take 60 mg by mouth 3 (three) times daily.      . QUEtiapine (SEROQUEL) 25 MG tablet Take 25 mg by mouth 2 (two) times daily.      Marland Kitchen saccharomyces boulardii (FLORASTOR) 250 MG capsule Take 250 mg by mouth daily.      . tacrolimus (PROGRAF) 5 MG capsule Take 5 mg by mouth 2 (two) times daily.        . Tamsulosin HCl (FLOMAX) 0.4 MG CAPS Take 0.8 mg by mouth daily.       . temazepam (RESTORIL) 15 MG capsule Take 7.5 mg by mouth at bedtime as needed and may repeat dose one time if needed. For sleep      . tiotropium (SPIRIVA HANDIHALER) 18 MCG inhalation capsule Place 1 capsule (18 mcg total) into inhaler and inhale daily.  30 capsule  6  . Vilazodone HCl (VIIBRYD) 40 MG TABS Take 20 mg by mouth daily.       . vitamin C (ASCORBIC ACID) 500 MG tablet Take 500 mg by mouth 2 (two) times daily.      Marland Kitchen zinc sulfate 220 MG capsule Take 220 mg by mouth daily.      Marland Kitchen albuterol (PROVENTIL HFA;VENTOLIN HFA) 108 (90 BASE) MCG/ACT inhaler Inhale 2 puffs into the lungs every 6 (six) hours as needed for wheezing.  1 Inhaler  0  . carbidopa-levodopa (SINEMET CR) 25-100 MG per tablet Take by mouth. 1 tablet in the morning, 2 tablets at lunch, 1 tablet in the evening      . DISCONTD: Calcium Carb-Cholecalciferol (CALCIUM 1000 + D PO) Take 1 tablet by mouth daily.        BP 128/62  Pulse 80  Ht 6\' 1"  (1.854 m)  Wt 192 lb (87.091 kg)  BMI 25.33 kg/m2  SpO2 99% General: NAD, overweight Neck: No JVD, no thyromegaly or thyroid nodule.  Lungs: Distant breath sounds bilaterally. CV: Nondisplaced PMI.  Heart regular S1/S2, no S3/S4, 2/6 early SEM RUSB.  1+ ankle edema.  Normal pedal pulses.  Right carotid bruit.  Abdomen: Soft, nontender, no hepatosplenomegaly, no distention.  Neurologic: Alert and oriented x 3.  Psych: Normal affect Extremities: No clubbing or cyanosis.

## 2011-09-22 NOTE — Assessment & Plan Note (Signed)
[name] does not appear significantly volume overloaded on exam.   I would continue the current dose of Lasix.

## 2011-09-22 NOTE — Assessment & Plan Note (Signed)
Need to get lipids/LFTs.  Goal LDL < 70.

## 2011-09-22 NOTE — Assessment & Plan Note (Signed)
Severe, now on home oxygen.  Followed by Dr. Delford Field.  Oxygen saturation was actually 95-96% today at rest.  He may only need oxygen with exertion and at night.  The oxygen cannula is irritating his face.

## 2011-09-22 NOTE — Assessment & Plan Note (Signed)
John [name] still has a foley catheter in.  He is going to go home from rehab in about 2 wks. I think he is going to be at risk for tripping over the catheter while trying to walk at home and would like it to come out if possible.  He has been having trouble getting in to see his urologist. We will try to help him make an appointment.

## 2011-09-22 NOTE — Assessment & Plan Note (Signed)
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  This a small caliber vessel and not amenable to intervention.  He is doing well with no significant ischemic symptoms.  He will also continue ASA 81, statin, Imdur, Plavix.  No beta blocker or ACEI due to orthostatic hypotension.  

## 2011-09-22 NOTE — Telephone Encounter (Signed)
F/u   Patient wife would like return call at hm#

## 2011-09-22 NOTE — Telephone Encounter (Signed)
Dossie Arbour, RN - appt More Detail >>      appt      Dossie Arbour, RN        Sent: Thu September 21, 2011  4:49 PM    To: Jacqlyn Krauss, RN        Mauri Brooklyn    MRN: [id number] DOB: Aug 19, 1940     Pt Home: [phone]               Message     Please call Dr. Darvin Neighbours' office at [facility] tomorrow to see if pt's appt can be moved earlier than August 12. Pt needs to have catheter removed prior to leaving nursing home.  Pt sees Dr. Earlene Plater when he comes to [place].  I tried to call today but office was closed.     Thanks     09/22/11   Dr Darvin Neighbours [phone] Spoke with Beth at Dr Earlene Plater' office. She states Dr Earlene Plater is unable to see pt before August 12. LMTCB for pt's wife to let her know I was unable to get a sooner appt for pt.

## 2011-10-03 ENCOUNTER — Other Ambulatory Visit: Payer: Self-pay | Admitting: Urology

## 2011-10-03 DIAGNOSIS — R339 Retention of urine, unspecified: Secondary | ICD-10-CM

## 2011-10-13 ENCOUNTER — Other Ambulatory Visit: Payer: Self-pay | Admitting: Cardiology

## 2011-10-25 ENCOUNTER — Ambulatory Visit
Admission: RE | Admit: 2011-10-25 | Discharge: 2011-10-25 | Disposition: A | Discharge status: Home / Self Care | Payer: Medicare Other | Source: Ambulatory Visit | Attending: Urology | Admitting: Urology

## 2011-10-25 ENCOUNTER — Other Ambulatory Visit: Payer: Self-pay | Admitting: Urology

## 2011-10-25 DIAGNOSIS — R339 Retention of urine, unspecified: Secondary | ICD-10-CM

## 2011-10-27 ENCOUNTER — Ambulatory Visit (INDEPENDENT_AMBULATORY_CARE_PROVIDER_SITE_OTHER): Payer: Medicare Other | Admitting: Critical Care Medicine

## 2011-10-27 ENCOUNTER — Encounter: Payer: Self-pay | Admitting: Critical Care Medicine

## 2011-10-27 VITALS — BP 134/66 | HR 76 | Temp 97.9°F | Ht 73.0 in

## 2011-10-27 DIAGNOSIS — N189 Chronic kidney disease, unspecified: Secondary | ICD-10-CM

## 2011-10-27 DIAGNOSIS — Z23 Encounter for immunization: Secondary | ICD-10-CM

## 2011-10-27 DIAGNOSIS — J449 Chronic obstructive pulmonary disease, unspecified: Secondary | ICD-10-CM

## 2011-10-27 MED ORDER — ALBUTEROL SULFATE HFA 108 (90 BASE) MCG/ACT IN AERS: 2.0000 | INHALATION_SPRAY | Freq: Four times a day (QID) | RESPIRATORY_TRACT | Status: DC | PRN

## 2011-10-27 MED ORDER — FLUTICASONE-SALMETEROL 250-50 MCG/DOSE IN AEPB: 1.0000 | INHALATION_SPRAY | Freq: Two times a day (BID) | RESPIRATORY_TRACT | Status: DC

## 2011-10-27 NOTE — Progress Notes (Signed)
Subjective:    Patient ID: John Novel., male    DOB: 1940/06/24, 71 y.o.   MRN: [id number]  HPI 71 y.o.  with history of renal transplant on chronic immunosuppressants, DM-insulin dependent, HTN, , Chronic back pain and CAD s/p CABG . Patient had a long and complicated hospital admission in 1/11 s/p CABG x 5.   Post-op course was complicated by pulmonary edema requiring intubation, renal failure requiring CVVH, and atrial fibrillation. He was in the hospital about 7 weeks. In 11/11, patient had right SFA and popliteal atherectomy that was complicated by a groin pseudoaneurysm. Hx of orthostatic hypotension, [place], and severe tremor followed by [facility]      08/09/2011 Pt with RA Sats 86%  Pt needing recert for portable oxygen therapy. Pt had back surgery.  Had inpatient rehab in April 2013 for three weeks then [facility] May 8th. Now no real cough, pt will have some mucus to raise .  Mucus comes and goes. Mucus now beige. No chest pain. No decline in breathing.  Pt does well with rehab.    10/27/2011 Since last OV. Pt was in [place] until Aug 16th.  Since d/c from SNF:  Only has one person to assist the pt.  Pt with complex issues.  No pain in back.  No dyspnea or coughing.  Abx from 6/13 helped. Pt can get up on walker, good and bad days.  PT comes to home twice weekly. Pt has home health RN comes twice weekly.    Past Medical History  Diagnosis Date  . CAD (coronary artery disease)     a. myoview 1/11: lg inf fixed defect;   b. cath 1/11: 3v CAD,  c. s/p CABG 1/11 c/b acute renal failure and AFib  . Atrial fibrillation   . Chronic diastolic heart failure     a. echo 4/11: EF 45-50%, mod LVH, LAE, inf and septal HK  . First degree AV block   . Orthostatic hypotension     pyridostigmine  . History of renal transplant   . DM2 (diabetes mellitus, type 2)   . Diabetic gastroparesis   . HTN (hypertension)   . HLD (hyperlipidemia)   . PAD (peripheral artery disease)     s/p  R SFA-Pop atherectomy 11/11  . Prostate cancer   . ED (erectile dysfunction)   . DDD (degenerative disc disease), lumbar   . Tremor   . Obesity   . Low testosterone   . Chronic kidney disease     s/p renal transplant 2002  . Pleural effusion, right     loculated  . COPD (chronic obstructive pulmonary disease)   . Anxiety   . Diabetes mellitus     insulin pump     Family History  Problem Relation Age of Onset  . Coronary artery disease Mother   . Valvular heart disease Father   . Hepatitis      sibling  . Coronary artery disease Sister   . Heart attack Sister   . Tuberculosis Father      History   Social History  . Marital Status: Married    Spouse Name: N/A    Number of Children: 3  . Years of Education: N/A   Occupational History  . retired Clinical research associate    Social History Main Topics  . Smoking status: Former Smoker -- 2.0 packs/day for 20 years    Types: Cigarettes    Quit date: 02/20/1978  . Smokeless tobacco: Never Used  Comment: smoked 1 ppd, stopped 1980  . Alcohol Use: No  . Drug Use: No  . Sexually Active: Not on file   Other Topics Concern  . Not on file   Social History Narrative  . No narrative on file     No Known Allergies   Outpatient Prescriptions Prior to Visit  Medication Sig Dispense Refill  . aspirin 81 MG tablet Take 81 mg by mouth daily.      Marland Kitchen atorvastatin (LIPITOR) 40 MG tablet Take 20 mg by mouth daily.       . calcium carbonate (OS-CAL - DOSED IN MG OF ELEMENTAL CALCIUM) 1250 MG tablet Take 2 tablets by mouth daily.      . carbidopa-levodopa (SINEMET CR) 50-200 MG per tablet Take 1/2 tablet by mouth every morning *DO NOT CRUSH OR CHEW*, take 1 tablet twice before lunch and dinner      . clobetasol (OLUX) 0.05 % topical foam Apply 1 application topically daily as needed. For head      . clopidogrel (PLAVIX) 75 MG tablet Take 75 mg by mouth daily.      Marland Kitchen ezetimibe (ZETIA) 10 MG tablet Take 10 mg by mouth daily.        . Ferrous  Sulfate (IRON) 325 (65 FE) MG TABS Take 1 tablet by mouth daily.       . folic acid (FOLVITE) 1 MG tablet Take 1 mg by mouth daily.      . insulin aspart (NOVOLOG) 100 UNIT/ML injection Inject into the skin. Administered via insulin pump      . isosorbide mononitrate (IMDUR) 60 MG 24 hr tablet Take 1 tablet (60 mg total) by mouth daily.  30 tablet  11  . LamoTRIgine (LAMICTAL ODT PO) Take 200 mg by mouth daily.       . Multiple Vitamin (MULITIVITAMIN WITH MINERALS) TABS Take 1 tablet by mouth daily.      . mycophenolate (CELLCEPT) 500 MG tablet Take 500 mg by mouth 2 (two) times daily.       Marland Kitchen oxyCODONE (OXY IR/ROXICODONE) 5 MG immediate release tablet Take 1 tablet by mouth every 6 (six) hours as needed. For pain      . pantoprazole (PROTONIX) 40 MG tablet Take 1 tablet (40 mg total) by mouth daily.      . predniSONE (DELTASONE) 5 MG tablet Take 5 mg by mouth daily.        Marland Kitchen pyridostigmine (MESTINON) 60 MG tablet Take 60 mg by mouth 3 (three) times daily.      . tacrolimus (PROGRAF) 5 MG capsule Take 5 mg by mouth 2 (two) times daily.        . Tamsulosin HCl (FLOMAX) 0.4 MG CAPS Take 0.4 mg by mouth daily.       . temazepam (RESTORIL) 15 MG capsule Take 7.5 mg by mouth at bedtime as needed and may repeat dose one time if needed. For sleep      . tiotropium (SPIRIVA HANDIHALER) 18 MCG inhalation capsule Place 1 capsule (18 mcg total) into inhaler and inhale daily.  30 capsule  6  . Vilazodone HCl (VIIBRYD) 40 MG TABS Take 20 mg by mouth daily.       . vitamin C (ASCORBIC ACID) 500 MG tablet Take 500 mg by mouth 2 (two) times daily.      Marland Kitchen albuterol (PROVENTIL HFA;VENTOLIN HFA) 108 (90 BASE) MCG/ACT inhaler Inhale 2 puffs into the lungs every 6 (six) hours as needed for wheezing.  1 Inhaler  0  . Fluticasone-Salmeterol (ADVAIR DISKUS) 250-50 MCG/DOSE AEPB Inhale 1 puff into the lungs every 12 (twelve) hours.        . furosemide (LASIX) 40 MG tablet Take 2 tablets (80 mg total) by mouth daily.  60  tablet  4  . acetaminophen (TYLENOL) 325 MG tablet Take 650 mg by mouth every 4 (four) hours as needed.      Marland Kitchen albuterol (PROVENTIL) (2.5 MG/3ML) 0.083% nebulizer solution Take 2.5 mg by nebulization 3 (three) times daily.      Marland Kitchen ALPRAZolam (XANAX) 0.25 MG tablet Take 0.25 mg by mouth 2 (two) times daily as needed.      . carbidopa-levodopa (SINEMET CR) 25-100 MG per tablet Take by mouth. 1 tablet in the morning, 2 tablets at lunch, 1 tablet in the evening      . feeding supplement (PRO-STAT SUGAR FREE 64) LIQD Take 30 mLs by mouth 2 (two) times daily.      . furosemide (LASIX) 40 MG tablet Take 80 mg by mouth daily.      . insulin glargine (LANTUS) 100 UNIT/ML injection Inject 35 Units into the skin every evening.      . Menthol-Zinc Oxide (CALMOSEPTINE) 0.44-20.625 % OINT Apply topically as directed. To buttocks      . QUEtiapine (SEROQUEL) 25 MG tablet Take 25 mg by mouth 2 (two) times daily.      Marland Kitchen saccharomyces boulardii (FLORASTOR) 250 MG capsule Take 250 mg by mouth daily.      Marland Kitchen zinc sulfate 220 MG capsule Take 220 mg by mouth daily.         Review of Systems  Constitutional:   No  weight loss, night sweats,  Fevers, chills,  +fatigue, or  lassitude.  HEENT:   No headaches,  Difficulty swallowing,  Tooth/dental problems, or  Sore throat,                No sneezing, itching, ear ache, nasal congestion, post nasal drip,   CV:  No chest pain,  Orthopnea, PND, swelling in lower extremities,  , dizziness,     GI  No heartburn, indigestion, abdominal pain, nausea, vomiting, diarrhea, change in bowel habits, loss of appetite, bloody stools.   Resp: +shortness of breath with exertion or at rest.   Notes  excess mucus, notes  productive cough,  No coughing up of blood.  Notes  change in color of mucus.  No wheezing.  No chest wall deformity  Skin: no rash or lesions.  GU: no dysuria, change in color of urine, no urgency or frequency.  No flank pain, no hematuria   MS:  +joint pain    +chronic  back pain.  Psych:  No change in mood or affect. No depression or anxiety.  No memory loss.          Objective:   Physical Exam  Filed Vitals:   10/27/11 1217  BP: 134/66  Pulse: 76  Temp: 97.9 F (36.6 C)  TempSrc: Oral  Height: 6\' 1"  (1.854 m)  SpO2: 95%    [id number]:[id number] WM in no distress,  normal affect  ENT: No lesions,  mouth clear,  oropharynx clear, no postnasal drip  Neck: no JVD, no TMG, no carotid bruits  Lungs: No use of accessory muscles, no dullness to percussion diminshed BS in bases , exp wheezes , scattered rhonchi  Cardiovascular: RRR, heart sounds normal, no murmur or gallops, 2+ pretibial  peripheral edema  Abdomen: soft and  NT, no HSM,  BS normal  Musculoskeletal: No deformities, no cyanosis or clubbing  Neuro: alert, non focal  Skin: echymoses       Assessment & Plan:   COPD (chronic obstructive pulmonary disease) Severe Copd Golds D oxygen dependent with chronic resp failure  Plan No change in inhaled or maintenance medications. Return in  3 months Flu vaccine       Updated Medication List Outpatient Encounter Prescriptions as of 10/27/2011  Medication Sig Dispense Refill  . albuterol (PROVENTIL HFA;VENTOLIN HFA) 108 (90 BASE) MCG/ACT inhaler Inhale 2 puffs into the lungs every 6 (six) hours as needed for wheezing or shortness of breath.  1 Inhaler  4  . aspirin 81 MG tablet Take 81 mg by mouth daily.      Marland Kitchen atorvastatin (LIPITOR) 40 MG tablet Take 20 mg by mouth daily.       . calcium carbonate (OS-CAL - DOSED IN MG OF ELEMENTAL CALCIUM) 1250 MG tablet Take 2 tablets by mouth daily.      . carbidopa-levodopa (SINEMET CR) 50-200 MG per tablet Take 1/2 tablet by mouth every morning *DO NOT CRUSH OR CHEW*, take 1 tablet twice before lunch and dinner      . clindamycin (CLEOCIN T) 1 % lotion Apply as directed daily      . clobetasol (OLUX) 0.05 % topical foam Apply 1 application topically daily as needed. For head      .  clopidogrel (PLAVIX) 75 MG tablet Take 75 mg by mouth daily.      Marland Kitchen ezetimibe (ZETIA) 10 MG tablet Take 10 mg by mouth daily.        . Ferrous Sulfate (IRON) 325 (65 FE) MG TABS Take 1 tablet by mouth daily.       . Fluticasone-Salmeterol (ADVAIR DISKUS) 250-50 MCG/DOSE AEPB Inhale 1 puff into the lungs every 12 (twelve) hours.  60 each  11  . folic acid (FOLVITE) 1 MG tablet Take 1 mg by mouth daily.      . furosemide (LASIX) 40 MG tablet Take 80 mg by mouth daily. And extra 40mg  daily if needed      . insulin aspart (NOVOLOG) 100 UNIT/ML injection Inject into the skin. Administered via insulin pump      . isosorbide mononitrate (IMDUR) 60 MG 24 hr tablet Take 1 tablet (60 mg total) by mouth daily.  30 tablet  11  . LamoTRIgine (LAMICTAL ODT PO) Take 200 mg by mouth daily.       . Multiple Vitamin (MULITIVITAMIN WITH MINERALS) TABS Take 1 tablet by mouth daily.      . mycophenolate (CELLCEPT) 500 MG tablet Take 500 mg by mouth 2 (two) times daily.       Marland Kitchen oxyCODONE (OXY IR/ROXICODONE) 5 MG immediate release tablet Take 1 tablet by mouth every 6 (six) hours as needed. For pain      . pantoprazole (PROTONIX) 40 MG tablet Take 1 tablet (40 mg total) by mouth daily.      . predniSONE (DELTASONE) 5 MG tablet Take 5 mg by mouth daily.        Marland Kitchen pyridostigmine (MESTINON) 60 MG tablet Take 60 mg by mouth 3 (three) times daily.      . risperiDONE (RISPERDAL) 3 MG tablet Take 3 mg by mouth daily.      . tacrolimus (PROGRAF) 5 MG capsule Take 5 mg by mouth 2 (two) times daily.        . Tamsulosin HCl (FLOMAX) 0.4  MG CAPS Take 0.4 mg by mouth daily.       . temazepam (RESTORIL) 15 MG capsule Take 7.5 mg by mouth at bedtime as needed and may repeat dose one time if needed. For sleep      . tiotropium (SPIRIVA HANDIHALER) 18 MCG inhalation capsule Place 1 capsule (18 mcg total) into inhaler and inhale daily.  30 capsule  6  . Vilazodone HCl (VIIBRYD) 40 MG TABS Take 20 mg by mouth daily.       . vitamin C  (ASCORBIC ACID) 500 MG tablet Take 500 mg by mouth 2 (two) times daily.      Marland Kitchen DISCONTD: albuterol (PROVENTIL HFA;VENTOLIN HFA) 108 (90 BASE) MCG/ACT inhaler Inhale 2 puffs into the lungs every 6 (six) hours as needed for wheezing.  1 Inhaler  0  . DISCONTD: Fluticasone-Salmeterol (ADVAIR DISKUS) 250-50 MCG/DOSE AEPB Inhale 1 puff into the lungs every 12 (twelve) hours.        Marland Kitchen DISCONTD: furosemide (LASIX) 40 MG tablet Take 2 tablets (80 mg total) by mouth daily.  60 tablet  4  . DISCONTD: acetaminophen (TYLENOL) 325 MG tablet Take 650 mg by mouth every 4 (four) hours as needed.      Marland Kitchen DISCONTD: albuterol (PROVENTIL) (2.5 MG/3ML) 0.083% nebulizer solution Take 2.5 mg by nebulization 3 (three) times daily.      Marland Kitchen DISCONTD: ALPRAZolam (XANAX) 0.25 MG tablet Take 0.25 mg by mouth 2 (two) times daily as needed.      Marland Kitchen DISCONTD: carbidopa-levodopa (SINEMET CR) 25-100 MG per tablet Take by mouth. 1 tablet in the morning, 2 tablets at lunch, 1 tablet in the evening      . DISCONTD: feeding supplement (PRO-STAT SUGAR FREE 64) LIQD Take 30 mLs by mouth 2 (two) times daily.      Marland Kitchen DISCONTD: furosemide (LASIX) 40 MG tablet Take 80 mg by mouth daily.      Marland Kitchen DISCONTD: insulin glargine (LANTUS) 100 UNIT/ML injection Inject 35 Units into the skin every evening.      Marland Kitchen DISCONTD: Menthol-Zinc Oxide (CALMOSEPTINE) 0.44-20.625 % OINT Apply topically as directed. To buttocks      . DISCONTD: QUEtiapine (SEROQUEL) 25 MG tablet Take 25 mg by mouth 2 (two) times daily.      Marland Kitchen DISCONTD: saccharomyces boulardii (FLORASTOR) 250 MG capsule Take 250 mg by mouth daily.      Marland Kitchen DISCONTD: zinc sulfate 220 MG capsule Take 220 mg by mouth daily.

## 2011-10-27 NOTE — Patient Instructions (Addendum)
Flu vaccine today No change in medications. Return in         3 months  

## 2011-10-27 NOTE — Assessment & Plan Note (Signed)
Severe Copd Golds D oxygen dependent with chronic resp failure  Plan No change in inhaled or maintenance medications. Return in  3 months Flu vaccine

## 2011-12-26 ENCOUNTER — Ambulatory Visit: Payer: Medicare Other | Admitting: Cardiology

## 2011-12-26 ENCOUNTER — Ambulatory Visit (INDEPENDENT_AMBULATORY_CARE_PROVIDER_SITE_OTHER): Payer: Medicare Other | Admitting: Cardiology

## 2011-12-26 ENCOUNTER — Encounter: Payer: Self-pay | Admitting: Cardiology

## 2011-12-26 VITALS — BP 142/64 | HR 76 | Ht 73.0 in | Wt 205.0 lb

## 2011-12-26 DIAGNOSIS — N189 Chronic kidney disease, unspecified: Secondary | ICD-10-CM

## 2011-12-26 DIAGNOSIS — I7389 Other specified peripheral vascular diseases: Secondary | ICD-10-CM

## 2011-12-26 DIAGNOSIS — J449 Chronic obstructive pulmonary disease, unspecified: Secondary | ICD-10-CM

## 2011-12-26 DIAGNOSIS — E785 Hyperlipidemia, unspecified: Secondary | ICD-10-CM

## 2011-12-26 DIAGNOSIS — I951 Orthostatic hypotension: Secondary | ICD-10-CM

## 2011-12-26 DIAGNOSIS — I2510 Atherosclerotic heart disease of native coronary artery without angina pectoris: Secondary | ICD-10-CM

## 2011-12-26 DIAGNOSIS — I5032 Chronic diastolic (congestive) heart failure: Secondary | ICD-10-CM

## 2011-12-26 NOTE — Progress Notes (Signed)
Patient ID: John Novel., male   DOB: 1940-11-17, 71 y.o.   MRN: [id number] PCP: Dr. [name]  71 yo with history of renal transplant, DM, HTN, and CAD s/p CABG returns for followup.  Patient had a long and complicated hospital admission in 1/11.  Patient was admitted to [facility] at that time with a UTI and pulmonary edema.  Troponin was noted to be elevated and the patient had a nuclear scan with a large fixed inferior defect.  He ended up having a cardiac catheterization showing severe disease involving the LM, LAD, ramus, CFX and RCA.  He had CABG x 5.  Post-op course was complicated by pulmonary edema requiring intubation, renal failure requiring CVVH, and atrial fibrillation.  He was in the hospital about 7 weeks.  In 11/11, patient had right SFA and popliteal atherectomy that was complicated by a left groin pseudoaneurysm.   Patient developed increased dyspnea in 2012.  Lexiscan myoview in 5/12 showed a partially reversible inferior defect.  Echo 6/12 showed preserved EF of 60%, moderate LVH, normal RV, and very mild AS.  I increased his Lasix, with some weight loss.  He saw Dr. Delford Field and had right-sided thoracentesis with removal of 500 cc of fluid.  Cytology was negative for malignancy.  In-office FEV1 was quite low (35% predicted) suggesting significant COPD, so he was started on inhalers.  Given recurrence of orthostatic hypotension symptoms, I also increased his pyridostigmine.   In the fall of 2012, John [name] reported worsening orthostatic-type lightheadedness and worsening exertional dyspnea.  I decided at that point to take him for right and left heart cath in 10/12.  On Lasix, his right and left heart filling pressures were not significantly elevated.  His bypass grafts were patent but he had an ungrafted PDA with 95% stenosis (small vessel that would not be amenable to PCI).  As this was a possible source of ischemia and could potentially cause exertional dyspnea (his anginal equivalent  was dyspnea in the past), I started him on Imdur 30 mg daily.   I also took him off lisinopril due to worsening orthostatic symptoms.    In 3/13, John [name] had back surgery.  Post-operative course was complicated by fluid retention and respiratory failure requiring intubation.  He developed PNA and delirium.  He had a prolonged hospitalization.  He is now out of rehab and at home.  He has home PT, OT and a home health RN.  His foley catheter is now out.  He has been having difficulty walking due to poor balance and right foot drop.  He is wearing a brace which has helped with the foot drop.  He walks with a walker.  He is using oxygen now only at night.  No chest pain.  No exertional dyspnea unless he goes on an "extensive walk" with PT.  Typically, he gets around his house without dyspnea.  No lightheadedness with standing.    Labs (9/11): LDL 71, HDL 45 Labs (12/11): K 4.9, creatinine 0.9, HCT 37.1  Labs (5/12): K 4.9, creatinine 0.9, BNP 157, HCT 36.4 Labs (5/12): LDL 42, HDL 44, K 3.7, creatinine 1.1, BNP 105 Labs (6/12): K 5, creatinine 1.1, TSH normal Labs (10/12): LDL 53, HDL 47, K 4.3, creatinine 4.09, BNP 197 Labs (12/12): K 4.1, creatinine 1.3 Labs (1/13): K 3.6, creatinine 0.96 Labs (5/13): K 4, creatinine 0.83 Labs (7/13): creatinine 0.7  Allergies (verified):  No Known Drug Allergies  Past Medical History: 1. Diabetes mellitus 2.  CKD s/p renal transplant 2002 3. HTN 4. PAD: Recent atherectomy right SFA and right popliteal on 01/12/10.  Complicated by left groin pseudoaneurysm.  5. Lumbar disc disease with low back pain 6. Diabetic gastroparesis 7. Prostate cancer s/p seed implantation in 1/06.  8. Erectile dysfunction 9. Tremor 10.  Obesity 11.  Diastolic CHF: Echo (6/12) showed EF 60%, moderate LV hypertrophy, very mild AS, normal RV size and systolic function.  Echo (3/13) with EF 55%, possible inferior hypokinesis, aortic sclerosis, mildly dilated RV with mildly  decreased RV systolic function.  12.  CAD: Lexiscan myoview (2/11) with EF 44%,  fixed inferior defect.  Cath (2/11) with 60% distal left main, 90% ostial LAD involving D1, 95% ostial large ramus, probable significant ostial CFX stenosis, 90% PDA, 70% PLV.  Paitent had CABG with LIMA-LAD, SVG-D, sequential SVG-ramus and OM2, SVG-PLV.  Lexiscan myoview (5/12) with EF 59%, partially reversible inferior defect.  LHC/RHC (10/12): RA 6, PA 38/12, PCWP 9, CI 2.8; SVG-OM and ramus patent, LIMA-LAD patent, SVG-D1 patent with 60% stenosis at touchdown, SVG-PLV patent; there was a 95% PDA stenosis (seen on study prior to CABG) that was ungrafted.  The PDA was a small caliber vessel and not well-suited to PCI.  This could be a source of exertional symptoms.   13.  Atrial fibrillation post-op CABG 14.  Shy-Drager syndrome with orthostatic hypotension:  improvement with pyridostigmine.  15.  Low testosterone 16.  Loculated right>left pleural effusion: Right thoracentesis (6/12), cytology negative for malignancy.  17.  COPD: Suspected.  FEV1 35% (in-office test).   18.  Carotid dopplers (6/12): 0-39% bilateral ICA stenosis.  19.  Depression 20.  Lumbar disc disease 21.  Heart block: Patient has had profound 1st degree AV block on ECG.  Holter (11/12) showed NSR, 1st degree heart block, occasional short runs of type I 2nd degree heart block.   Family History: Noncontributory  Social History: Married, retired Clinical research associate, lives in [place].  Prior smoker.  ROS: All systems reviewed and negative except as per HPI.    Current Outpatient Prescriptions  Medication Sig Dispense Refill  . albuterol (PROVENTIL HFA;VENTOLIN HFA) 108 (90 BASE) MCG/ACT inhaler Inhale 2 puffs into the lungs every 6 (six) hours as needed for wheezing or shortness of breath.  1 Inhaler  4  . aspirin 81 MG tablet Take 81 mg by mouth daily.      Marland Kitchen atorvastatin (LIPITOR) 40 MG tablet Take 20 mg by mouth daily.       . calcium carbonate  (OS-CAL - DOSED IN MG OF ELEMENTAL CALCIUM) 1250 MG tablet Take 2 tablets by mouth daily.      . carbidopa-levodopa (SINEMET CR) 50-200 MG per tablet Take 1/2 tablet by mouth every morning *DO NOT CRUSH OR CHEW*, take 1 tablet twice before lunch and dinner      . Cholecalciferol (VITAMIN D-3) 5000 UNITS TABS Take 2 tablets by mouth 3 (three) times a week.      . clopidogrel (PLAVIX) 75 MG tablet Take 75 mg by mouth daily.      Marland Kitchen ezetimibe (ZETIA) 10 MG tablet Take 10 mg by mouth daily.        . Ferrous Sulfate (IRON) 325 (65 FE) MG TABS Take 1 tablet by mouth daily.       . Fluticasone-Salmeterol (ADVAIR DISKUS) 250-50 MCG/DOSE AEPB Inhale 1 puff into the lungs every 12 (twelve) hours.  60 each  11  . folic acid (FOLVITE) 1 MG tablet Take 1 mg  by mouth daily.      . furosemide (LASIX) 40 MG tablet Take 80 mg by mouth daily. And extra 40mg  daily if needed      . insulin aspart (NOVOLOG) 100 UNIT/ML injection Inject into the skin. Administered via insulin pump      . isosorbide mononitrate (IMDUR) 60 MG 24 hr tablet Take 1 tablet (60 mg total) by mouth daily.  30 tablet  11  . LamoTRIgine (LAMICTAL ODT PO) Take 200 mg by mouth daily.       . Multiple Vitamin (MULITIVITAMIN WITH MINERALS) TABS Take 1 tablet by mouth daily.      . mycophenolate (CELLCEPT) 500 MG tablet Take 500 mg by mouth 2 (two) times daily.       Marland Kitchen oxyCODONE (OXY IR/ROXICODONE) 5 MG immediate release tablet Take 1 tablet by mouth every 6 (six) hours as needed. For pain      . pantoprazole (PROTONIX) 40 MG tablet Take 1 tablet (40 mg total) by mouth daily.      . predniSONE (DELTASONE) 5 MG tablet Take 5 mg by mouth daily.        Marland Kitchen pyridostigmine (MESTINON) 60 MG tablet Take 60 mg by mouth 3 (three) times daily.      . risperiDONE (RISPERDAL) 3 MG tablet Take 1.5 mg by mouth daily.       . tacrolimus (PROGRAF) 5 MG capsule Take 5 mg by mouth 2 (two) times daily.        . Tamsulosin HCl (FLOMAX) 0.4 MG CAPS Take 0.4 mg by mouth  daily.       . temazepam (RESTORIL) 15 MG capsule Take 15-30 mg by mouth at bedtime as needed and may repeat dose one time if needed. For sleep      . tiotropium (SPIRIVA HANDIHALER) 18 MCG inhalation capsule Place 1 capsule (18 mcg total) into inhaler and inhale daily.  30 capsule  6  . Vilazodone HCl (VIIBRYD) 40 MG TABS Take 20 mg by mouth daily.       . zaleplon (SONATA) 10 MG capsule as needed.      . clindamycin (CLEOCIN T) 1 % lotion as needed. Apply as directed daily      . clobetasol (OLUX) 0.05 % topical foam Apply 1 application topically daily as needed. For head      . vitamin C (ASCORBIC ACID) 500 MG tablet Take 500 mg by mouth 2 (two) times daily.      . [DISCONTINUED] Calcium Carb-Cholecalciferol (CALCIUM 1000 + D PO) Take 1 tablet by mouth daily.        BP 142/64  Pulse 76  Ht 6\' 1"  (1.854 m)  Wt 205 lb (92.987 kg)  BMI 27.05 kg/m2 General: NAD Neck: No JVD, no thyromegaly or thyroid nodule.  Lungs: Distant breath sounds bilaterally. CV: Nondisplaced PMI.  Heart regular S1/S2, no S3/S4, 2/6 early SEM RUSB.  1+ ankle edema.  Normal pedal pulses.  Right carotid bruit.  Abdomen: Soft, nontender, no hepatosplenomegaly, no distention.  Neurologic: Alert and oriented x 3.  Psych: Normal affect Extremities: No clubbing or cyanosis.   Assessment/Plan:  Chronic diastolic heart failure  John [name] does not appear significantly volume overloaded on exam. I would continue the current dose of Lasix. Will get BMET and BNP.  His ambulation is limited more by balance and foot drop than by dyspnea.  COPD  Severe.  He is only on oxygen at night at this time. Followed by Dr. Delford Field.  CORONARY  ATHEROSLERO UNSPEC TYPE BYPASS GRAFT  Ungrafted PDA seen on last cath could be a source for ischemia. This a small caliber vessel and not amenable to intervention. He is doing well with no significant ischemic symptoms. He will also continue ASA 81, statin, Imdur, Plavix. No beta blocker or ACEI due  to orthostatic hypotension.  HYPERLIPIDEMIA-MIXED  Need to get lipids/LFTs. Goal LDL < 70.  Orthostatic hypotension Shy-Drager syndrome. Continue pyridostigmine. He is not having significant orthostatic symptoms at this time.  PVD WITH CLAUDICATION  Per Dr. Clifton James. No claudication. I told him that he could hold off on his ABIs for now until he has recovered more.  Followup in 4 months.    [name] 12/26/2011 2:56 PM

## 2011-12-26 NOTE — Patient Instructions (Addendum)
Your physician recommends that you have  a FASTING lipid profile /BMET/BNP. You have the order. Please fax the results to Dr Shirlee Latch [phone].  Your physician recommends that you schedule a follow-up appointment in: 4 months with Dr Shirlee Latch. This is scheduled for Friday March 7,2014 at 10 am.

## 2012-01-01 ENCOUNTER — Telehealth: Payer: Self-pay | Admitting: Cardiology

## 2012-01-01 NOTE — Telephone Encounter (Signed)
Pt's states he went for lab today at Dr Altheimer's office. He had to drink ensure before lab was done because his glucose was low. He wanted Dr Shirlee Latch to have this information when he reviewed the lab results for lipid/BMET/BNP.

## 2012-01-01 NOTE — Telephone Encounter (Signed)
Pt calling re blood sugar very low this am , had ensure before fasting blood test, done at dr altheimer's office this am

## 2012-01-04 ENCOUNTER — Other Ambulatory Visit: Payer: Self-pay | Admitting: Cardiology

## 2012-01-04 ENCOUNTER — Encounter (INDEPENDENT_AMBULATORY_CARE_PROVIDER_SITE_OTHER): Payer: Medicare Other

## 2012-01-04 DIAGNOSIS — L98499 Non-pressure chronic ulcer of skin of other sites with unspecified severity: Secondary | ICD-10-CM

## 2012-01-04 DIAGNOSIS — L8990 Pressure ulcer of unspecified site, unspecified stage: Secondary | ICD-10-CM

## 2012-01-26 ENCOUNTER — Ambulatory Visit (INDEPENDENT_AMBULATORY_CARE_PROVIDER_SITE_OTHER): Payer: Medicare Other | Admitting: Critical Care Medicine

## 2012-01-26 ENCOUNTER — Encounter: Payer: Self-pay | Admitting: Critical Care Medicine

## 2012-01-26 VITALS — BP 120/78 | HR 81 | Temp 98.0°F | Ht 73.0 in

## 2012-01-26 DIAGNOSIS — J449 Chronic obstructive pulmonary disease, unspecified: Secondary | ICD-10-CM

## 2012-01-26 DIAGNOSIS — Z9181 History of falling: Secondary | ICD-10-CM | POA: Insufficient documentation

## 2012-01-26 DIAGNOSIS — J9610 Chronic respiratory failure, unspecified whether with hypoxia or hypercapnia: Secondary | ICD-10-CM

## 2012-01-26 NOTE — Progress Notes (Signed)
Subjective:    Patient ID: John [name]., male    DOB: [birth year], 71 y.o.   MRN: [id number]  HPI 71 y.o.  with history of renal transplant on chronic immunosuppressants, DM-insulin dependent, HTN, , Chronic back pain and CAD s/p CABG . Patient had a long and complicated hospital admission in 1/11 s/p CABG x 5.   Post-op course was complicated by pulmonary edema requiring intubation, renal failure requiring CVVH, and atrial fibrillation. He was in the hospital about 7 weeks. In 11/11, patient had right SFA and popliteal atherectomy that was complicated by a groin pseudoaneurysm. Hx of orthostatic hypotension, ShyDrager, and severe tremor followed by Dohmeier      08/09/2011 Pt with RA Sats 86%  Pt needing recert for portable oxygen therapy. Pt had back surgery.  Had inpatient rehab in April 2013 for three weeks then [facility] May 8th. Now no real cough, pt will have some mucus to raise .  Mucus comes and goes. Mucus now beige. No chest pain. No decline in breathing.  Pt does well with rehab.    10/27/2011 Since last OV. Pt was in [facility] until Aug 16th.  Since d/c from SNF:  Only has one person to assist the pt.  Pt with complex issues.  No pain in back.  No dyspnea or coughing.  Abx from 6/13 helped. Pt can get up on walker, good and bad days.  PT comes to home twice weekly. Pt has home health RN comes twice weekly.    01/26/2012 Since last OV had a shower and had skin issue on heel.  Patch of skin hanging on heal about 5 weeks ago>>>dressed.  Now is healing. Now having some bleeding.  Pt also fell in bathroom this past week. Dyspnea is stable. Min mucus .  No real cough.  Pain is better in the back.  HHPT still involved.   Past Medical History  Diagnosis Date  . CAD (coronary artery disease)     a. myoview 1/11: lg inf fixed defect;   b. cath 1/11: 3v CAD,  c. s/p CABG 1/11 c/b acute renal failure and AFib  . Atrial fibrillation   . Chronic diastolic heart failure     a. echo  4/11: EF 45-50%, mod LVH, LAE, inf and septal HK  . First degree AV block   . Orthostatic hypotension     pyridostigmine  . History of renal transplant   . DM2 (diabetes mellitus, type 2)   . Diabetic gastroparesis   . HTN (hypertension)   . HLD (hyperlipidemia)   . PAD (peripheral artery disease)     s/p R SFA-Pop atherectomy 11/11  . Prostate cancer   . ED (erectile dysfunction)   . DDD (degenerative disc disease), lumbar   . Tremor   . Obesity   . Low testosterone   . Chronic kidney disease     s/p renal transplant 2002  . Pleural effusion, right     loculated  . COPD (chronic obstructive pulmonary disease)   . Anxiety   . Diabetes mellitus     insulin pump     Family History  Problem Relation Age of Onset  . Coronary artery disease Mother   . Valvular heart disease Father   . Hepatitis      sibling  . Coronary artery disease Sister   . Heart attack Sister   . Tuberculosis Father      History   Social History  . Marital Status: Married  Spouse Name: N/A    Number of Children: 3  . Years of Education: N/A   Occupational History  . retired Clinical research associate    Social History Main Topics  . Smoking status: Former Smoker -- 2.0 packs/day for 20 years    Types: Cigarettes    Quit date: 02/20/1978  . Smokeless tobacco: Never Used     Comment: smoked 1 ppd, stopped 1980  . Alcohol Use: No  . Drug Use: No  . Sexually Active: Not on file   Other Topics Concern  . Not on file   Social History Narrative  . No narrative on file     No Known Allergies   Outpatient Prescriptions Prior to Visit  Medication Sig Dispense Refill  . albuterol (PROVENTIL HFA;VENTOLIN HFA) 108 (90 BASE) MCG/ACT inhaler Inhale 2 puffs into the lungs every 6 (six) hours as needed for wheezing or shortness of breath.  1 Inhaler  4  . aspirin 81 MG tablet Take 81 mg by mouth daily.      Marland Kitchen atorvastatin (LIPITOR) 40 MG tablet Take 40 mg by mouth daily.       . calcium carbonate (OS-CAL - DOSED  IN MG OF ELEMENTAL CALCIUM) 1250 MG tablet Take 2 tablets by mouth daily.      . carbidopa-levodopa (SINEMET CR) 50-200 MG per tablet Take 1/2 tablet by mouth every morning *DO NOT CRUSH OR CHEW*, take 1 tablet twice before lunch and dinner      . Cholecalciferol (VITAMIN D-3) 5000 UNITS TABS Take 2 tablets by mouth 3 (three) times a week.      . clindamycin (CLEOCIN T) 1 % lotion as needed. Apply as directed daily      . clobetasol (OLUX) 0.05 % topical foam Apply 1 application topically daily as needed. For head      . clopidogrel (PLAVIX) 75 MG tablet Take 75 mg by mouth daily.      Marland Kitchen ezetimibe (ZETIA) 10 MG tablet Take 10 mg by mouth daily.        . Ferrous Sulfate (IRON) 325 (65 FE) MG TABS Take 1 tablet by mouth daily.       . folic acid (FOLVITE) 1 MG tablet Take 1 mg by mouth daily.      . furosemide (LASIX) 40 MG tablet Take 80 mg by mouth daily. And extra 40mg  daily if needed      . insulin aspart (NOVOLOG) 100 UNIT/ML injection Inject into the skin. Administered via insulin pump      . isosorbide mononitrate (IMDUR) 60 MG 24 hr tablet Take 1 tablet (60 mg total) by mouth daily.  30 tablet  11  . LamoTRIgine (LAMICTAL ODT PO) Take 200 mg by mouth daily.       . Multiple Vitamin (MULITIVITAMIN WITH MINERALS) TABS Take 1 tablet by mouth daily.      . mycophenolate (CELLCEPT) 500 MG tablet Take 500 mg by mouth 2 (two) times daily.       Marland Kitchen oxyCODONE (OXY IR/ROXICODONE) 5 MG immediate release tablet Take 1 tablet by mouth every 6 (six) hours as needed. For pain      . pantoprazole (PROTONIX) 40 MG tablet Take 1 tablet (40 mg total) by mouth daily.      . predniSONE (DELTASONE) 5 MG tablet Take 5 mg by mouth daily.        Marland Kitchen pyridostigmine (MESTINON) 60 MG tablet Take by mouth. 2 tablets in am and 1 in the evening      .  risperiDONE (RISPERDAL) 3 MG tablet Take 4.5 mg by mouth daily.       . tacrolimus (PROGRAF) 5 MG capsule Take 5 mg by mouth 2 (two) times daily.        . Tamsulosin HCl  (FLOMAX) 0.4 MG CAPS Take 0.8 mg by mouth daily.       . temazepam (RESTORIL) 15 MG capsule Take 15-30 mg by mouth at bedtime as needed and may repeat dose one time if needed. For sleep      . tiotropium (SPIRIVA HANDIHALER) 18 MCG inhalation capsule Place 1 capsule (18 mcg total) into inhaler and inhale daily.  30 capsule  6  . Vilazodone HCl (VIIBRYD) 40 MG TABS Take 20 mg by mouth daily.       . vitamin C (ASCORBIC ACID) 500 MG tablet Take 1,000 mg by mouth daily.       . zaleplon (SONATA) 10 MG capsule as needed.      . [DISCONTINUED] Fluticasone-Salmeterol (ADVAIR DISKUS) 250-50 MCG/DOSE AEPB Inhale 1 puff into the lungs every 12 (twelve) hours.  60 each  11  Last reviewed on 01/26/2012  4:01 PM by Storm Frisk, MD   Review of Systems  Constitutional:   No  weight loss, night sweats,  Fevers, chills,  +fatigue, or  lassitude.  HEENT:   No headaches,  Difficulty swallowing,  Tooth/dental problems, or  Sore throat,                No sneezing, itching, ear ache, nasal congestion, post nasal drip,   CV:  No chest pain,  Orthopnea, PND, swelling in lower extremities,  , dizziness,     GI  No heartburn, indigestion, abdominal pain, nausea, vomiting, diarrhea, change in bowel habits, loss of appetite, bloody stools.   Resp: +shortness of breath with exertion or at rest.   Notes  excess mucus, notes  productive cough,  No coughing up of blood.  Notes  change in color of mucus.  No wheezing.  No chest wall deformity  Skin: no rash or lesions.  GU: no dysuria, change in color of urine, no urgency or frequency.  No flank pain, no hematuria   MS:  +joint pain   +chronic  back pain.  Psych:  No change in mood or affect. No depression or anxiety.  No memory loss.          Objective:   Physical Exam  Filed Vitals:   01/26/12 1532  BP: 120/78  Pulse: 81  Temp: 98 F (36.7 C)  TempSrc: Oral  Height: 6\' 1"  (1.854 m)  SpO2: 90%    [id number] WM in no distress,  normal  affect  ENT: No lesions,  mouth clear,  oropharynx clear, no postnasal drip  Neck: no JVD, no TMG, no carotid bruits  Lungs: No use of accessory muscles, no dullness to percussion diminshed BS in bases , exp wheezes , scattered rhonchi  Cardiovascular: RRR, heart sounds normal, no murmur or gallops, 2+ pretibial  peripheral edema  Abdomen: soft and NT, no HSM,  BS normal  Musculoskeletal: No deformities, no cyanosis or clubbing  Neuro: alert, non focal  Skin: echymoses       Assessment & Plan:   COPD (chronic obstructive pulmonary disease) Copd Golds D with chronic resp failure  Plan Increase advair to one puff twice daily No other medication chagnes Return 3 months       Updated Medication List Outpatient Encounter Prescriptions as of 01/26/2012  Medication  Sig Dispense Refill  . albuterol (PROVENTIL HFA;VENTOLIN HFA) 108 (90 BASE) MCG/ACT inhaler Inhale 2 puffs into the lungs every 6 (six) hours as needed for wheezing or shortness of breath.  1 Inhaler  4  . aspirin 81 MG tablet Take 81 mg by mouth daily.      Marland Kitchen atorvastatin (LIPITOR) 40 MG tablet Take 40 mg by mouth daily.       . calcium carbonate (OS-CAL - DOSED IN MG OF ELEMENTAL CALCIUM) 1250 MG tablet Take 2 tablets by mouth daily.      . carbidopa-levodopa (SINEMET CR) 50-200 MG per tablet Take 1/2 tablet by mouth every morning *DO NOT CRUSH OR CHEW*, take 1 tablet twice before lunch and dinner      . Cholecalciferol (VITAMIN D-3) 5000 UNITS TABS Take 2 tablets by mouth 3 (three) times a week.      . clindamycin (CLEOCIN T) 1 % lotion as needed. Apply as directed daily      . clobetasol (OLUX) 0.05 % topical foam Apply 1 application topically daily as needed. For head      . clopidogrel (PLAVIX) 75 MG tablet Take 75 mg by mouth daily.      Marland Kitchen ezetimibe (ZETIA) 10 MG tablet Take 10 mg by mouth daily.        . Ferrous Sulfate (IRON) 325 (65 FE) MG TABS Take 1 tablet by mouth daily.       .  Fluticasone-Salmeterol (ADVAIR) 250-50 MCG/DOSE AEPB Inhale 1 puff into the lungs daily.      . folic acid (FOLVITE) 1 MG tablet Take 1 mg by mouth daily.      . furosemide (LASIX) 40 MG tablet Take 80 mg by mouth daily. And extra 40mg  daily if needed      . GLUCAGEN HYPOKIT 1 MG SOLR Use as directed if needed      . insulin aspart (NOVOLOG) 100 UNIT/ML injection Inject into the skin. Administered via insulin pump      . isosorbide mononitrate (IMDUR) 60 MG 24 hr tablet Take 1 tablet (60 mg total) by mouth daily.  30 tablet  11  . LamoTRIgine (LAMICTAL ODT PO) Take 200 mg by mouth daily.       . Multiple Vitamin (MULITIVITAMIN WITH MINERALS) TABS Take 1 tablet by mouth daily.      . mycophenolate (CELLCEPT) 500 MG tablet Take 500 mg by mouth 2 (two) times daily.       Marland Kitchen oxyCODONE (OXY IR/ROXICODONE) 5 MG immediate release tablet Take 1 tablet by mouth every 6 (six) hours as needed. For pain      . pantoprazole (PROTONIX) 40 MG tablet Take 1 tablet (40 mg total) by mouth daily.      . predniSONE (DELTASONE) 5 MG tablet Take 5 mg by mouth daily.        Marland Kitchen pyridostigmine (MESTINON) 60 MG tablet Take by mouth. 2 tablets in am and 1 in the evening      . risperiDONE (RISPERDAL) 3 MG tablet Take 4.5 mg by mouth daily.       . tacrolimus (PROGRAF) 5 MG capsule Take 5 mg by mouth 2 (two) times daily.        . Tamsulosin HCl (FLOMAX) 0.4 MG CAPS Take 0.8 mg by mouth daily.       . temazepam (RESTORIL) 15 MG capsule Take 15-30 mg by mouth at bedtime as needed and may repeat dose one time if needed. For sleep      .  tiotropium (SPIRIVA HANDIHALER) 18 MCG inhalation capsule Place 1 capsule (18 mcg total) into inhaler and inhale daily.  30 capsule  6  . Vilazodone HCl (VIIBRYD) 40 MG TABS Take 20 mg by mouth daily.       . vitamin C (ASCORBIC ACID) 500 MG tablet Take 1,000 mg by mouth daily.       . zaleplon (SONATA) 10 MG capsule as needed.      . [DISCONTINUED] Fluticasone-Salmeterol (ADVAIR DISKUS) 250-50  MCG/DOSE AEPB Inhale 1 puff into the lungs every 12 (twelve) hours.  60 each  11

## 2012-01-26 NOTE — Patient Instructions (Addendum)
Increase advair to one puff twice daily No other medication chagnes Return 3 months

## 2012-01-28 DIAGNOSIS — J9610 Chronic respiratory failure, unspecified whether with hypoxia or hypercapnia: Secondary | ICD-10-CM | POA: Insufficient documentation

## 2012-01-28 NOTE — Assessment & Plan Note (Signed)
Copd Golds D with chronic resp failure  Plan Increase advair to one puff twice daily No other medication chagnes Return 3 months

## 2012-02-08 ENCOUNTER — Encounter: Payer: Self-pay | Admitting: Physical Medicine & Rehabilitation

## 2012-03-01 ENCOUNTER — Other Ambulatory Visit: Payer: Self-pay | Admitting: *Deleted

## 2012-03-01 MED ORDER — ISOSORBIDE MONONITRATE ER 60 MG PO TB24: 60.0000 mg | ORAL_TABLET | Freq: Every day | ORAL | Status: DC

## 2012-03-06 ENCOUNTER — Other Ambulatory Visit (HOSPITAL_COMMUNITY): Payer: Self-pay | Admitting: *Deleted

## 2012-03-07 ENCOUNTER — Encounter (HOSPITAL_COMMUNITY): Payer: Medicare Other

## 2012-03-11 ENCOUNTER — Encounter: Payer: Medicare Other | Attending: Physical Medicine & Rehabilitation

## 2012-03-11 ENCOUNTER — Ambulatory Visit (HOSPITAL_BASED_OUTPATIENT_CLINIC_OR_DEPARTMENT_OTHER): Payer: Medicare Other | Admitting: Physical Medicine & Rehabilitation

## 2012-03-11 ENCOUNTER — Encounter: Payer: Self-pay | Admitting: Physical Medicine & Rehabilitation

## 2012-03-11 VITALS — BP 113/54 | HR 80 | Resp 14 | Ht 71.0 in | Wt 220.0 lb

## 2012-03-11 DIAGNOSIS — R269 Unspecified abnormalities of gait and mobility: Secondary | ICD-10-CM | POA: Insufficient documentation

## 2012-03-11 DIAGNOSIS — M216X9 Other acquired deformities of unspecified foot: Secondary | ICD-10-CM

## 2012-03-11 DIAGNOSIS — M21371 Foot drop, right foot: Secondary | ICD-10-CM

## 2012-03-11 DIAGNOSIS — G20 Parkinson's disease: Secondary | ICD-10-CM | POA: Insufficient documentation

## 2012-03-11 DIAGNOSIS — Z5181 Encounter for therapeutic drug level monitoring: Secondary | ICD-10-CM

## 2012-03-11 DIAGNOSIS — R5381 Other malaise: Secondary | ICD-10-CM | POA: Insufficient documentation

## 2012-03-11 DIAGNOSIS — G5730 Lesion of lateral popliteal nerve, unspecified lower limb: Secondary | ICD-10-CM | POA: Insufficient documentation

## 2012-03-11 DIAGNOSIS — G20A1 Parkinson's disease without dyskinesia, without mention of fluctuations: Secondary | ICD-10-CM | POA: Insufficient documentation

## 2012-03-11 NOTE — Progress Notes (Signed)
Subjective:    Patient ID: John [name]., male    DOB: 12-26-1940, 72 y.o.   MRN: [id number] CC: R foot drop, "I want to walk" HPI This is a 72 year old right-handed male  with history of coronary artery disease, atrial fibrillation, renal  transplant in 2002 as well as COPD with chronic oxygen therapy, admitted  on May 16, 2011, with radiating low back pain. X-rays and imaging  revealed spondylosis and herniated nucleus pulposus lumbar L5-S1 with  radiculopathy. Underwent bilateral laminotomies and foraminotomies,  decompression lumbar L5 and S1 On May 16, 2011 by Dr. Danielle Dess. Routine  back precautions and no brace required. Postoperative confusion,  respiratory distress secondary to congestive heart failure, initially  placed on Lasix therapy. On May 21, 2011, developed worsening  respiratory distress and hypoxia with Critical Care Medicine consulted  and was intubated. The patient spiked a fever, placed on empiric  antibiotics. Blood cultures on May 18, 2011, were negative. The  patient later extubated on June 02, 2011, and monitored closely.  Underwent right thoracentesis on June 01, 2011, for pleural effusion  with 400 mL removed by Interventional Radiology. Diet; slowly advanced  to mechanical soft, thin liquids. 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   CIR rehab Attended by myself followed by [facility] ~5 months  Currently walks with walker at home.  Has walked with walker for the last 6 months.  Back surgery in March 2013, post op vent failure.    [facility].  Foot slips within orthosis. Saw [facility] to confer about other bracing options  Pain Inventory Average Pain 1 Pain Right Now 1 My pain is intermittent  In the last 24 hours, has pain interfered with the following? General activity 0 Relation with others 0 Enjoyment of  life 0 What TIME of day is your pain at its worst? morning Sleep (in general) Poor  Pain is worse with: some activites Pain improves with: rest and heat/ice Relief from Meds: 3  Mobility use a walker how many minutes can you walk? 5 ability to climb steps?  no do you drive?  no use a wheelchair transfers alone Do you have any goals in this area?  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 Coronary artery disease Mother   . Valvular heart disease Father   . Hepatitis      sibling  . Coronary artery disease Sister   . Heart attack Sister   . Tuberculosis Father    History   Social History  . Marital Status: Married    Spouse Name: N/A    Number of Children: 3  . Years of Education: N/A   Occupational History  . retired Clinical research associate    Social History Main Topics  . Smoking status: Former Smoker -- 2.0 packs/day for 20 years    Types: Cigarettes    Quit date: 02/20/1978  . Smokeless tobacco: Never Used     Comment: smoked 1 ppd, stopped 1980  . Alcohol Use: No  . Drug Use: No  .  Sexually Active: None   Other Topics Concern  . None   Social History Narrative  . None   Past Surgical History  Procedure Date  . Coronary artery bypass graft 03/2009  . Cataract extraction   . Vitrectomy   . Tonsillectomy   . Kidney transplant 2002  . Hernia repair   . Prostate cancer rx 2006 brachytherapy   . Prior peritoneal catheter placement   . Lumbar laminectomy/decompression microdiscectomy 05/16/2011    Procedure: LUMBAR LAMINECTOMY/DECOMPRESSION MICRODISCECTOMY 1 LEVEL;  Surgeon: Barnett Abu, MD;  Location: [facility];  Service: Neurosurgery;  Laterality: Bilateral;  Bilateral Lumbar Five-Sacral One Laminectomy   Past Medical History  Diagnosis Date  . CAD (coronary artery disease)     a. myoview 1/11: lg inf fixed defect;   b. cath 1/11: 3v CAD,  c. s/p CABG 1/11 c/b acute renal failure and AFib  . Atrial fibrillation   . Chronic diastolic heart failure     a. echo 4/11: EF 45-50%, mod LVH, LAE, inf and septal HK  . First degree AV block   . Orthostatic hypotension     pyridostigmine  . History of renal transplant   . DM2 (diabetes mellitus, type 2)   . Diabetic gastroparesis   . HTN (hypertension)   . HLD (hyperlipidemia)   . PAD (peripheral artery disease)     s/p R SFA-Pop atherectomy 11/11  . Prostate cancer   . ED (erectile dysfunction)   . DDD (degenerative disc disease), lumbar   . Tremor   . Obesity   . Low testosterone   . Chronic kidney disease     s/p renal transplant 2002  . Pleural effusion, right     loculated  . COPD (chronic obstructive pulmonary disease)   . Anxiety   . Diabetes mellitus     insulin pump   BP 113/54  Pulse 80  Resp 14  Ht 5\' 11"  (1.803 m)  Wt 220 lb (99.791 kg)  BMI 30.68 kg/m2  SpO2 84%     Review of Systems  Respiratory: Positive for shortness of breath.   Cardiovascular: Positive for leg swelling.  Musculoskeletal: Positive for myalgias, arthralgias and gait problem.  Neurological: Positive for tremors, weakness and numbness.  Psychiatric/Behavioral: Positive for suicidal ideas and dysphoric mood. The patient is nervous/anxious.   All other systems reviewed and are negative.       Objective:   Physical Exam  Nursing note and vitals reviewed. Constitutional: He is oriented to person, place, and time. He appears well-developed and well-nourished.  HENT:  Head: Normocephalic and atraumatic.  Eyes: Conjunctivae normal and EOM are normal. Pupils are equal, round, and reactive to light.  Neck: Normal  range of motion.  Musculoskeletal:       There is 2+ edema in both lower extremities below the knee.  The right AFO is fitting poorly it appears to be very loose at the proximal aspect  Neurological: He is alert and oriented to person, place, and time. He exhibits abnormal muscle tone. Gait abnormal.  Reflex Scores:      Patellar reflexes are 1+ on the right side and 1+ on the left side.      Achilles reflexes are 0 on the right side and 0 on the left side.      Decreased sensation on the dorsum of the right foot but intact at the fifth digit as well as the plantar surface  4/5 strength in bilateral deltoid, biceps, triceps, grip 3+  in bilateral hip flexors and knee extensors 0 at the right ankle dorsiflexor 2 at the right ankle plantar flexor 3 at the left ankle dorsiflexor and plantar flexor  Psychiatric: Thought content normal. His mood appears anxious. His speech is delayed. He is slowed.          Assessment & Plan:  1. Multifactor gait disorder. A. Parkinson's disease which may be negatively affected by the Risperdal B. Deconditioning after 5 month nursing home stay C. Right peroneal neuropathy With poorly fitting AFO.Because of fluctuating edema, he requires double metal upright AFO. D.  Probable diabetic neuropathy Over half of the 40 min visit was spent counseling and coordinating care.

## 2012-03-11 NOTE — Patient Instructions (Signed)
Prescription for double metal upright AFO written *Psychiatrist about weaning Risperdal this may be Counteracting your Parkinson's medications Physical therapy at [facility] neuro rehabilitation

## 2012-03-12 ENCOUNTER — Encounter (HOSPITAL_COMMUNITY)
Admission: RE | Admit: 2012-03-12 | Discharge: 2012-03-12 | Disposition: A | Discharge status: Home / Self Care | Payer: Medicare Other | Source: Ambulatory Visit | Attending: Nephrology | Admitting: Nephrology

## 2012-03-12 DIAGNOSIS — N189 Chronic kidney disease, unspecified: Secondary | ICD-10-CM | POA: Insufficient documentation

## 2012-03-12 DIAGNOSIS — Z940 Kidney transplant status: Secondary | ICD-10-CM | POA: Insufficient documentation

## 2012-03-12 MED ORDER — SODIUM CHLORIDE 0.9 % IV SOLN
INTRAVENOUS | Status: DC
  Administered 2012-03-12: 10:00:00 via INTRAVENOUS

## 2012-03-12 MED ORDER — FERUMOXYTOL INJECTION 510 MG/17 ML: 510.0000 mg | INTRAVENOUS | Status: DC

## 2012-03-12 MED ORDER — FERUMOXYTOL INJECTION 510 MG/17 ML
INTRAVENOUS | Status: AC
  Administered 2012-03-12: 510 mg
  Filled 2012-03-12: qty 17

## 2012-03-13 ENCOUNTER — Other Ambulatory Visit: Payer: Self-pay | Admitting: Cardiology

## 2012-03-19 ENCOUNTER — Ambulatory Visit: Payer: Medicare Other | Admitting: Physical Therapy

## 2012-03-19 ENCOUNTER — Encounter (HOSPITAL_COMMUNITY)
Admission: RE | Admit: 2012-03-19 | Discharge: 2012-03-19 | Disposition: A | Discharge status: Home / Self Care | Payer: Medicare Other | Source: Ambulatory Visit | Attending: Nephrology | Admitting: Nephrology

## 2012-03-19 MED ORDER — SODIUM CHLORIDE 0.9 % IV SOLN: INTRAVENOUS | Status: DC

## 2012-03-19 MED ORDER — FERUMOXYTOL INJECTION 510 MG/17 ML
INTRAVENOUS | Status: AC
  Filled 2012-03-19: qty 17

## 2012-03-19 MED ORDER — FERUMOXYTOL INJECTION 510 MG/17 ML
510.0000 mg | INTRAVENOUS | Status: DC
  Administered 2012-03-19: 510 mg via INTRAVENOUS

## 2012-04-03 ENCOUNTER — Telehealth: Payer: Self-pay

## 2012-04-03 NOTE — Telephone Encounter (Signed)
Patients wife called regarding appointment and whether or not the need to keep it.  The patient has not had any therapy due to a pressure sore on his heel.

## 2012-04-05 NOTE — Telephone Encounter (Signed)
Did the pt receive AFO yet, if so I'd like to check it if he is able to wear it.  If not then reschedule pt when he has received AFO and is going to PT

## 2012-04-08 ENCOUNTER — Encounter: Payer: Medicare Other | Attending: Physical Medicine & Rehabilitation

## 2012-04-08 ENCOUNTER — Encounter: Payer: Self-pay | Admitting: Physical Medicine & Rehabilitation

## 2012-04-08 ENCOUNTER — Ambulatory Visit (HOSPITAL_BASED_OUTPATIENT_CLINIC_OR_DEPARTMENT_OTHER): Payer: Medicare Other | Admitting: Physical Medicine & Rehabilitation

## 2012-04-08 VITALS — BP 117/47 | HR 81 | Resp 14 | Ht 71.0 in | Wt 215.0 lb

## 2012-04-08 DIAGNOSIS — G20A1 Parkinson's disease without dyskinesia, without mention of fluctuations: Secondary | ICD-10-CM | POA: Insufficient documentation

## 2012-04-08 DIAGNOSIS — M216X9 Other acquired deformities of unspecified foot: Secondary | ICD-10-CM | POA: Insufficient documentation

## 2012-04-08 DIAGNOSIS — E1140 Type 2 diabetes mellitus with diabetic neuropathy, unspecified: Secondary | ICD-10-CM

## 2012-04-08 DIAGNOSIS — G20 Parkinson's disease: Secondary | ICD-10-CM | POA: Insufficient documentation

## 2012-04-08 DIAGNOSIS — R269 Unspecified abnormalities of gait and mobility: Secondary | ICD-10-CM | POA: Insufficient documentation

## 2012-04-08 DIAGNOSIS — M21371 Foot drop, right foot: Secondary | ICD-10-CM

## 2012-04-08 DIAGNOSIS — E1142 Type 2 diabetes mellitus with diabetic polyneuropathy: Secondary | ICD-10-CM

## 2012-04-08 DIAGNOSIS — G5730 Lesion of lateral popliteal nerve, unspecified lower limb: Secondary | ICD-10-CM | POA: Insufficient documentation

## 2012-04-08 DIAGNOSIS — R5381 Other malaise: Secondary | ICD-10-CM | POA: Insufficient documentation

## 2012-04-08 DIAGNOSIS — E1149 Type 2 diabetes mellitus with other diabetic neurological complication: Secondary | ICD-10-CM

## 2012-04-08 NOTE — Patient Instructions (Addendum)
Please do knee extension exercises in your chair holding her leg straight for 5 seconds. Do 10 repetitions each leg. Do this twice a day. Do hip flexion exercises hold her hip up for 5 seconds. Do 10 repetitions each leg alternating sides. Do this twice a day May start occupational therapy to work with your arm strength as well as coordination and dexterity

## 2012-04-08 NOTE — Telephone Encounter (Signed)
Will discuss at next appointment

## 2012-04-08 NOTE — Progress Notes (Signed)
Subjective:    Patient ID: John Novel., male    DOB: 1940/09/16, 72 y.o.   MRN: [id number]  HPI Developed another Left heel ulcer being treated by podiatry.  Will be re evaluated again on Wednesday. CIR rehab Attended by myself followed by [facility] for ~5 months  Currently walks with walker at home. Has walked with walker for the last 6 months. Back surgery in March 2013, post op vent failure.  R [facility]. Foot slips within orthosis.  Saw [facility] to confer about other bracing options,The orthotist wished to confer with physical therapy about bracing. As noted above patient developed another left heel ulcer and therapy was put off until this was treated.  Pain Inventory Average Pain 4 Pain Right Now 3 My pain is aching  In the last 24 hours, has pain interfered with the following? General activity 0 Relation with others 0 Enjoyment of life 0 What TIME of day is your pain at its worst? night Sleep (in general) Fair  Pain is worse with: some activites Pain improves with: rest, heat/ice and medication Relief from Meds: 3  Mobility use a walker ability to climb steps?  no do you drive?  no use a wheelchair Do you have any goals in this area?  yes  Function retired I need assistance with the following:  dressing, bathing and toileting  Neuro/Psych trouble walking depression anxiety  Prior Studies Any changes since last visit?  no  Physicians involved in your care Any changes since last visit?  no   Family History  Problem Relation Age of Onset  . Coronary artery disease Mother   . Valvular heart disease Father   . Hepatitis      sibling  . Coronary artery disease Sister   . Heart attack Sister   . Tuberculosis Father    History   Social History  . Marital Status: Married    Spouse Name: N/A    Number of Children: 3  . Years of Education: N/A   Occupational History  . retired Clinical research associate    Social History  Main Topics  . Smoking status: Former Smoker -- 2.00 packs/day for 20 years    Types: Cigarettes    Quit date: 02/20/1978  . Smokeless tobacco: Never Used     Comment: smoked 1 ppd, stopped 1980  . Alcohol Use: No  . Drug Use: No  . Sexually Active: None   Other Topics Concern  . None   Social History Narrative  . None   Past Surgical History  Procedure Laterality Date  . Coronary artery bypass graft  03/2009  . Cataract extraction    . Vitrectomy    . Tonsillectomy    . Kidney transplant  2002  . Hernia repair    . Prostate cancer rx 2006 brachytherapy    . Prior peritoneal catheter placement    . Lumbar laminectomy/decompression microdiscectomy  05/16/2011    Procedure: LUMBAR LAMINECTOMY/DECOMPRESSION MICRODISCECTOMY 1 LEVEL;  Surgeon: Barnett Abu, MD;  Location: [facility];  Service: Neurosurgery;  Laterality: Bilateral;  Bilateral Lumbar Five-Sacral One Laminectomy   Past Medical History  Diagnosis Date  . CAD (coronary artery disease)     a. myoview 1/11: lg inf fixed defect;   b. cath 1/11: 3v CAD,  c. s/p CABG 1/11 c/b acute renal failure and AFib  . Atrial fibrillation   . Chronic diastolic heart failure     a. echo 4/11: EF 45-50%,  mod LVH, LAE, inf and septal HK  . First degree AV block   . Orthostatic hypotension     pyridostigmine  . History of renal transplant   . DM2 (diabetes mellitus, type 2)   . Diabetic gastroparesis   . HTN (hypertension)   . HLD (hyperlipidemia)   . PAD (peripheral artery disease)     s/p R SFA-Pop atherectomy 11/11  . Prostate cancer   . ED (erectile dysfunction)   . DDD (degenerative disc disease), lumbar   . Tremor   . Obesity   . Low testosterone   . Chronic kidney disease     s/p renal transplant 2002  . Pleural effusion, right     loculated  . COPD (chronic obstructive pulmonary disease)   . Anxiety   . Diabetes mellitus     insulin pump   BP 117/47  Pulse 81  Resp 14  Ht 5\' 11"  (1.803 m)  Wt 215 lb (97.523  kg)  BMI 30 kg/m2  SpO2 91%     Review of Systems  Musculoskeletal: Positive for gait problem.  Psychiatric/Behavioral: Positive for dysphoric mood. The patient is nervous/anxious.   All other systems reviewed and are negative.       Objective:   Physical Exam   Constitutional: He is oriented to person, place, and time. He appears well-developed and well-nourished. Appears fatigued HENT:  Head: Normocephalic and atraumatic.  Eyes: Conjunctivae normal and EOM are normal. Pupils are equal, round, and reactive to light.  Neck: Normal range of motion.  Musculoskeletal:  There is 3+ edema in Right lower extremities below the knee 2+.  The right AFO is fitting poorly it appears to be very loose at the proximal aspect  Neurological: He is alert and oriented to person, place, and time. He exhibits abnormal muscle tone. Gait abnormal.  Reflex Scores:  Patellar reflexes are 1+ on the right side and 1+ on the left side.  Achilles reflexes are 0 on the right side and 0 on the left side. Decreased sensation on the dorsum of the right foot but intact at the fifth digit as well as the plantar surface  4/5 strength in bilateral deltoid, biceps, triceps, grip 3+ in bilateral hip flexors and knee extensors 0 at the right ankle dorsiflexor 2 at the right ankle plantar flexor 3 at the left ankle dorsiflexor and plantar flexor  Psychiatric: Thought content normal. His mood appears anxious. His speech is delayed. He is slowed.       Assessment & Plan:  1. Multifactor gait disorder.  A. Parkinson's disease which may be negatively affected by the Risperdal His does was reduced from 4.5 mg 3 mg. He plans to followup with psychiatry and hopefully reduce it again next time B. Deconditioning after 5 month nursing home stay  C. Bilateral foot drop With poorly fitting Right AFO.Because of fluctuating edema, he requires double metal upright AFO. He may benefit from bilateral AFOs D. Probable diabetic  neuropathy Causing numbness in the hands and feet as well as bilateral foot drop Even though he is on hold for physical therapy at this time, I feel he would benefit from occupational therapy to work on assisted devices as well as upper extremity strengthening. He may be cleared for PT this week as well. I'll see him back in about one month's time.

## 2012-04-11 ENCOUNTER — Other Ambulatory Visit: Payer: Self-pay | Admitting: *Deleted

## 2012-04-11 ENCOUNTER — Other Ambulatory Visit: Payer: Self-pay | Admitting: Cardiology

## 2012-04-11 MED ORDER — CLOPIDOGREL BISULFATE 75 MG PO TABS: 75.0000 mg | ORAL_TABLET | Freq: Every day | ORAL | Status: DC

## 2012-04-12 ENCOUNTER — Telehealth: Payer: Self-pay | Admitting: *Deleted

## 2012-04-12 NOTE — Telephone Encounter (Signed)
OK to renew 

## 2012-04-12 NOTE — Telephone Encounter (Signed)
Will need a new order for neuro rehab. Old one expired.

## 2012-04-17 NOTE — Telephone Encounter (Signed)
Following up on outpt rehab.  He has been signed up for OT but still needs new referral for PT.  Order placed.

## 2012-04-18 ENCOUNTER — Encounter: Payer: Self-pay | Admitting: *Deleted

## 2012-04-24 ENCOUNTER — Ambulatory Visit: Payer: Medicare Other | Attending: Physical Medicine & Rehabilitation | Admitting: Occupational Therapy

## 2012-04-24 DIAGNOSIS — R279 Unspecified lack of coordination: Secondary | ICD-10-CM | POA: Insufficient documentation

## 2012-04-24 DIAGNOSIS — Z5189 Encounter for other specified aftercare: Secondary | ICD-10-CM | POA: Insufficient documentation

## 2012-04-24 DIAGNOSIS — R5381 Other malaise: Secondary | ICD-10-CM | POA: Insufficient documentation

## 2012-04-24 DIAGNOSIS — M2420 Disorder of ligament, unspecified site: Secondary | ICD-10-CM | POA: Insufficient documentation

## 2012-04-24 DIAGNOSIS — M629 Disorder of muscle, unspecified: Secondary | ICD-10-CM | POA: Insufficient documentation

## 2012-04-24 DIAGNOSIS — R4189 Other symptoms and signs involving cognitive functions and awareness: Secondary | ICD-10-CM | POA: Insufficient documentation

## 2012-04-26 ENCOUNTER — Ambulatory Visit: Payer: Medicare Other | Admitting: Cardiology

## 2012-04-30 ENCOUNTER — Encounter: Payer: Medicare Other | Attending: Physical Medicine & Rehabilitation

## 2012-04-30 ENCOUNTER — Encounter: Payer: Self-pay | Admitting: Physical Medicine & Rehabilitation

## 2012-04-30 ENCOUNTER — Ambulatory Visit (HOSPITAL_BASED_OUTPATIENT_CLINIC_OR_DEPARTMENT_OTHER): Payer: Medicare Other | Admitting: Physical Medicine & Rehabilitation

## 2012-04-30 VITALS — BP 136/47 | HR 75 | Resp 14 | Ht 71.0 in | Wt 215.0 lb

## 2012-04-30 DIAGNOSIS — M21372 Foot drop, left foot: Secondary | ICD-10-CM | POA: Insufficient documentation

## 2012-04-30 DIAGNOSIS — R269 Unspecified abnormalities of gait and mobility: Secondary | ICD-10-CM | POA: Insufficient documentation

## 2012-04-30 DIAGNOSIS — G5730 Lesion of lateral popliteal nerve, unspecified lower limb: Secondary | ICD-10-CM | POA: Insufficient documentation

## 2012-04-30 DIAGNOSIS — R5381 Other malaise: Secondary | ICD-10-CM | POA: Insufficient documentation

## 2012-04-30 DIAGNOSIS — G20 Parkinson's disease: Secondary | ICD-10-CM | POA: Insufficient documentation

## 2012-04-30 DIAGNOSIS — G20A1 Parkinson's disease without dyskinesia, without mention of fluctuations: Secondary | ICD-10-CM | POA: Insufficient documentation

## 2012-04-30 DIAGNOSIS — M216X9 Other acquired deformities of unspecified foot: Secondary | ICD-10-CM | POA: Insufficient documentation

## 2012-04-30 DIAGNOSIS — M21371 Foot drop, right foot: Secondary | ICD-10-CM

## 2012-04-30 NOTE — Patient Instructions (Signed)
Recommend bilateral AFOs Start PT

## 2012-04-30 NOTE — Progress Notes (Signed)
Subjective:    Patient ID: John [name]., male    DOB: 20-Apr-1940, 72 y.o.   MRN: [id number]  HPI CIR rehab Attended by myself followed [facility] for ~5 months  Currently walks with walker at home. Has walked with walker for the last 6 months. Back surgery in March 2013, post op vent failure.  R [facility]. Foot slips within orthosis.  Saw [facility] to confer about other bracing options,The orthotist wished to confer with physical therapy about bracing.  As noted above patient developed another left heel ulcer and therapy was put off until this was treated.  Has received wound care for left heel ulcer and cleared for physical therapy Pain Inventory Average Pain 4 Pain Right Now 2 My pain is intermittent and stabbing  In the last 24 hours, has pain interfered with the following? General activity 5 Relation with others 4 Enjoyment of life 4 What TIME of day is your pain at its worst? night Sleep (in general) Good  Pain is worse with: inactivity Pain improves with: heat/ice Relief from Meds: 5  Mobility walk with assistance use a walker ability to climb steps?  no do you drive?  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  no  Physicians involved in your care Any changes since last visit?  no   Family History  Problem Relation Age of Onset  . Coronary artery disease Mother   . Valvular heart disease Father   . Hepatitis      sibling  . Coronary artery disease Sister   . Heart attack Sister   . Tuberculosis Father    History   Social History  . Marital Status: Married    Spouse Name: N/A    Number of Children: 3  . Years of Education: N/A   Occupational History  . retired Clinical research associate     Social History Main Topics  . Smoking status: Former Smoker -- 2.00 packs/day for 20 years    Types: Cigarettes    Quit date: 02/20/1978  . Smokeless tobacco: Never Used     Comment: smoked 1 ppd, stopped 1980  . Alcohol Use: No  . Drug Use: No  . Sexually Active: None   Other Topics Concern  . None   Social History Narrative  . None   Past Surgical History  Procedure Laterality Date  . Coronary artery bypass graft  03/2009  . Cataract extraction    . Vitrectomy    . Tonsillectomy    . Kidney transplant  2002  . Hernia repair    . Prostate cancer rx 2006 brachytherapy    . Prior peritoneal catheter placement    . Lumbar laminectomy/decompression microdiscectomy  05/16/2011    Procedure: LUMBAR LAMINECTOMY/DECOMPRESSION MICRODISCECTOMY 1 LEVEL;  Surgeon: Barnett Abu, MD;  Location: [facility];  Service: Neurosurgery;  Laterality: Bilateral;  Bilateral Lumbar Five-Sacral One Laminectomy   Past Medical History  Diagnosis Date  . CAD (coronary artery disease)     a. myoview 1/11: lg inf fixed defect;   b. cath 1/11: 3v CAD,  c. s/p CABG 1/11 c/b acute renal failure and AFib  . Atrial fibrillation   . Chronic diastolic heart failure     a. echo 4/11: EF 45-50%, mod  LVH, LAE, inf and septal HK  . First degree AV block   . Orthostatic hypotension     pyridostigmine  . History of renal transplant   . DM2 (diabetes mellitus, type 2)   . Diabetic gastroparesis   . HTN (hypertension)   . HLD (hyperlipidemia)   . PAD (peripheral artery disease)     s/p R SFA-Pop atherectomy 11/11  . Prostate cancer   . ED (erectile dysfunction)   . DDD (degenerative disc disease), lumbar   . Tremor   . Obesity   . Low testosterone   . Chronic kidney disease     s/p renal transplant 2002  . Pleural effusion, right     loculated  . COPD (chronic obstructive pulmonary disease)   . Anxiety   . Diabetes mellitus     insulin pump   BP 136/47  Pulse 75  Ht 5\' 11"  (1.803 m)  Wt 215 lb  (97.523 kg)  BMI 30 kg/m2  SpO2 87%    Review of Systems  Constitutional: Positive for unexpected weight change.  Respiratory: Positive for shortness of breath.   Musculoskeletal: Positive for gait problem.  Neurological: Positive for dizziness and tremors.  Hematological: Bruises/bleeds easily.  Psychiatric/Behavioral: Positive for confusion and dysphoric mood. The patient is nervous/anxious.   All other systems reviewed and are negative.       Objective:   Physical Exam well-nourished. Appears fatigued  HENT:  Head: Normocephalic and atraumatic.  Eyes: Conjunctivae normal and EOM are normal. Pupils are equal, round, and reactive to light.  Neck: Normal range of motion.  Musculoskeletal:  There is 3+ edema in Right lower extremities below the knee 2+.  The right AFO is fitting poorly it appears to be very loose at the proximal aspect  Neurological: He is alert and oriented to person, place, and time. He exhibits abnormal muscle tone. Gait abnormal.  Reflex Scores:  Patellar reflexes are 1+ on the right side and 1+ on the left side.  Achilles reflexes are 0 on the right side and 0 on the left side.   4/5 strength in bilateral deltoid, biceps, triceps, grip 3+ in bilateral hip flexors and knee extensors 0 at the right ankle dorsiflexor 2 at the right ankle plantar flexor 3 at the left ankle dorsiflexor and plantar flexor ,0 left ankle everter Psychiatric: Thought content normal. His mood appears anxious. His speech is delayed. He is slowed.      Assessment & Plan:  1. Multifactor gait disorder.  A. Parkinson's disease which may be negatively affected by the Risperdal His does was reduced from  3 mg to 1mg . He plans to followup with psychiatry and hopefully reduce it again next time  B. Deconditioning after 5 month nursing home stay  C. Bilateral foot drop With poorly fitting Right AFO.Because of fluctuating edema, he requires double metal upright AFO. He may benefit from  bilateral AFOs ,orthotist will confer with PT during therapy. D. Probable diabetic neuropathy Causing numbness in the hands and feet as well as bilateral foot drop   He will start PT this week as well.  I'll see him back in about one month's time.

## 2012-05-02 ENCOUNTER — Other Ambulatory Visit: Payer: Self-pay | Admitting: Cardiology

## 2012-05-03 ENCOUNTER — Ambulatory Visit: Payer: Medicare Other | Admitting: Physical Therapy

## 2012-05-07 ENCOUNTER — Ambulatory Visit: Payer: Medicare Other | Admitting: Physical Therapy

## 2012-05-08 ENCOUNTER — Other Ambulatory Visit: Payer: Self-pay | Admitting: Critical Care Medicine

## 2012-05-08 ENCOUNTER — Ambulatory Visit: Payer: Medicare Other | Admitting: Physical Therapy

## 2012-05-10 ENCOUNTER — Ambulatory Visit: Payer: Medicare Other | Admitting: Physical Therapy

## 2012-05-13 ENCOUNTER — Ambulatory Visit (INDEPENDENT_AMBULATORY_CARE_PROVIDER_SITE_OTHER): Payer: Medicare Other | Admitting: Cardiology

## 2012-05-13 ENCOUNTER — Encounter: Payer: Self-pay | Admitting: Cardiology

## 2012-05-13 ENCOUNTER — Ambulatory Visit: Payer: Medicare Other | Admitting: Physical Therapy

## 2012-05-13 VITALS — BP 128/70 | HR 71 | Ht 71.0 in | Wt 220.0 lb

## 2012-05-13 DIAGNOSIS — J4489 Other specified chronic obstructive pulmonary disease: Secondary | ICD-10-CM

## 2012-05-13 DIAGNOSIS — I5032 Chronic diastolic (congestive) heart failure: Secondary | ICD-10-CM

## 2012-05-13 DIAGNOSIS — E7800 Pure hypercholesterolemia, unspecified: Secondary | ICD-10-CM

## 2012-05-13 DIAGNOSIS — E785 Hyperlipidemia, unspecified: Secondary | ICD-10-CM

## 2012-05-13 DIAGNOSIS — I441 Atrioventricular block, second degree: Secondary | ICD-10-CM

## 2012-05-13 DIAGNOSIS — R0602 Shortness of breath: Secondary | ICD-10-CM

## 2012-05-13 DIAGNOSIS — I951 Orthostatic hypotension: Secondary | ICD-10-CM

## 2012-05-13 DIAGNOSIS — J449 Chronic obstructive pulmonary disease, unspecified: Secondary | ICD-10-CM

## 2012-05-13 DIAGNOSIS — I2510 Atherosclerotic heart disease of native coronary artery without angina pectoris: Secondary | ICD-10-CM

## 2012-05-13 DIAGNOSIS — I7389 Other specified peripheral vascular diseases: Secondary | ICD-10-CM

## 2012-05-13 DIAGNOSIS — I509 Heart failure, unspecified: Secondary | ICD-10-CM

## 2012-05-13 MED ORDER — POTASSIUM CHLORIDE CRYS ER 20 MEQ PO TBCR: 20.0000 meq | EXTENDED_RELEASE_TABLET | Freq: Every day | ORAL | Status: DC

## 2012-05-13 MED ORDER — FUROSEMIDE 40 MG PO TABS: ORAL_TABLET | ORAL | Status: DC

## 2012-05-13 NOTE — Progress Notes (Signed)
Patient ID: John Novel., male   DOB: 10/06/1940, 72 y.o.   MRN: [id number] PCP: Dr. [name]  72 yo with history of renal transplant, DM, HTN, and CAD s/p CABG returns for followup.  Patient had a long and complicated hospital admission in 1/11.  Patient was admitted to [facility] at that time with a UTI and pulmonary edema.  Troponin was noted to be elevated and the patient had a nuclear scan with a large fixed inferior defect.  He ended up having a cardiac catheterization showing severe disease involving the LM, LAD, ramus, CFX and RCA.  He had CABG x 5.  Post-op course was complicated by pulmonary edema requiring intubation, renal failure requiring CVVH, and atrial fibrillation.  He was in the hospital about 7 weeks.  In 11/11, patient had right SFA and popliteal atherectomy that was complicated by a left groin pseudoaneurysm.   In the fall of 2012, John [name] reported worsening orthostatic-type lightheadedness and worsening exertional dyspnea.  I decided at that point to take him for right and left heart cath in 10/12.  On Lasix, his right and left heart filling pressures were not significantly elevated.  His bypass grafts were patent but he had an ungrafted PDA with 95% stenosis (small vessel that would not be amenable to PCI).  As this was a possible source of ischemia and could potentially cause exertional dyspnea (his anginal equivalent was dyspnea in the past), I started him on Imdur.   I also took him off lisinopril due to worsening orthostatic symptoms.  He has been on pyridostigmine for orthostatic hypotension with supine hypertension.   In 3/13, John [name] had back surgery.  Post-operative course was complicated by fluid retention and respiratory failure requiring intubation.  He developed PNA and delirium.  He had a prolonged hospitalization and rehabilitation.  He continues home PT.  He has been having difficulty walking due to poor balance and right foot drop.  He is wearing a brace which  has helped with the foot drop.  He walks with a walker.  It has been noted at PT that his oxygen saturation is dropping into the 80s with exertion. He is short of breath walking more than short distances with his walker.  He is wearing oxygen all the time now.  No lightheadedness (continues pyridostigmine).  He is now sleeping on 2-3 pillows because of orthopnea.  No chest pain.  He takes Lasix 80 mg in the morning.  Weight is up 15 lbs since last appointment. He also has had an ulcer on his left foot being treated by wound care. ECG shows type I 2nd degree AV block.   Labs (9/11): LDL 71, HDL 45 Labs (12/11): K 4.9, creatinine 0.9, HCT 37.1  Labs (5/12): K 4.9, creatinine 0.9, BNP 157, HCT 36.4 Labs (5/12): LDL 42, HDL 44, K 3.7, creatinine 1.1, BNP 105 Labs (6/12): K 5, creatinine 1.1, TSH normal Labs (10/12): LDL 53, HDL 47, K 4.3, creatinine 4.69, BNP 197 Labs (12/12): K 4.1, creatinine 1.3 Labs (1/13): K 3.6, creatinine 0.96 Labs (5/13): K 4, creatinine 0.83 Labs (7/13): creatinine 0.7 Labs (12/13): K 3.7, creatinine 0.78  ECG: NSR with type I 2nd degree AV block  Allergies (verified):  No Known Drug Allergies  Past Medical History: 1. Diabetes mellitus 2. CKD s/p renal transplant 2002 3. HTN 4. PAD: Recent atherectomy right SFA and right popliteal on 01/12/10.  Complicated by left groin pseudoaneurysm.  5. Lumbar disc disease with low back  pain 6. Diabetic gastroparesis 7. Prostate cancer s/p seed implantation in 1/06.  8. Erectile dysfunction 9. Tremor 10.  Obesity 11.  Diastolic CHF: Echo (6/12) showed EF 60%, moderate LV hypertrophy, very mild AS, normal RV size and systolic function.  Echo (3/13) with EF 55%, possible inferior hypokinesis, aortic sclerosis, mildly dilated RV with mildly decreased RV systolic function.  12.  CAD: Lexiscan myoview (2/11) with EF 44%,  fixed inferior defect.  Cath (2/11) with 60% distal left main, 90% ostial LAD involving D1, 95% ostial large  ramus, probable significant ostial CFX stenosis, 90% PDA, 70% PLV.  Paitent had CABG with LIMA-LAD, SVG-D, sequential SVG-ramus and OM2, SVG-PLV.  Lexiscan myoview (5/12) with EF 59%, partially reversible inferior defect.  LHC/RHC (10/12): RA 6, PA 38/12, PCWP 9, CI 2.8; SVG-OM and ramus patent, LIMA-LAD patent, SVG-D1 patent with 60% stenosis at touchdown, SVG-PLV patent; there was a 95% PDA stenosis (seen on study prior to CABG) that was ungrafted.  The PDA was a small caliber vessel and not well-suited to PCI.  This could be a source of exertional symptoms.   13.  Atrial fibrillation post-op CABG 14.  Shy-Drager syndrome with orthostatic hypotension:  improvement with pyridostigmine.  15.  Low testosterone 16.  Loculated right>left pleural effusion: Right thoracentesis (6/12), cytology negative for malignancy.  17.  COPD: Suspected.  FEV1 35% (in-office test).   18.  Carotid dopplers (6/12): 0-39% bilateral ICA stenosis.  19.  Depression 20.  Lumbar disc disease 21.  Heart block: Patient has had profound 1st degree AV block on ECG.  Holter (11/12) showed NSR, 1st degree heart block, occasional short runs of type I 2nd degree heart block.   Family History: Noncontributory  Social History: Married, retired Clinical research associate, lives in [place].  Prior smoker.  ROS: All systems reviewed and negative except as per HPI.    Current Outpatient Prescriptions  Medication Sig Dispense Refill  . albuterol (PROVENTIL HFA;VENTOLIN HFA) 108 (90 BASE) MCG/ACT inhaler Inhale 2 puffs into the lungs every 6 (six) hours as needed for wheezing or shortness of breath.  1 Inhaler  4  . aspirin 81 MG tablet Take 81 mg by mouth daily.      Marland Kitchen atorvastatin (LIPITOR) 40 MG tablet Take 40 mg by mouth daily.       . calcium carbonate (OS-CAL - DOSED IN MG OF ELEMENTAL CALCIUM) 1250 MG tablet Take 2 tablets by mouth daily.      . carbidopa-levodopa (SINEMET CR) 50-200 MG per tablet Take 1/2 tablet by mouth every morning *DO  NOT CRUSH OR CHEW*, take 1 tablet twice before lunch and dinner      . Cholecalciferol (VITAMIN D-3) 5000 UNITS TABS Take 2 tablets by mouth 3 (three) times a week.      . clindamycin (CLEOCIN T) 1 % lotion as needed. Apply as directed daily      . clobetasol (OLUX) 0.05 % topical foam Apply 1 application topically daily as needed. For head      . clopidogrel (PLAVIX) 75 MG tablet Take 1 tablet (75 mg total) by mouth daily.  30 tablet  5  . eszopiclone (LUNESTA) 2 MG TABS Take 2 mg by mouth at bedtime. Take immediately before bedtime      . ezetimibe (ZETIA) 10 MG tablet Take 10 mg by mouth daily.        . Ferrous Sulfate (IRON) 325 (65 FE) MG TABS Take 1 tablet by mouth daily.       Marland Kitchen  Fluticasone-Salmeterol (ADVAIR) 250-50 MCG/DOSE AEPB Inhale 1 puff into the lungs daily.      . folic acid (FOLVITE) 1 MG tablet Take 1 mg by mouth daily.      Marland Kitchen GLUCAGEN HYPOKIT 1 MG SOLR Use as directed if needed      . HYDROcodone-acetaminophen (NORCO/VICODIN) 5-325 MG per tablet Take 1 tablet by mouth as needed.      . insulin aspart (NOVOLOG) 100 UNIT/ML injection Inject into the skin. Administered via insulin pump      . isosorbide mononitrate (IMDUR) 60 MG 24 hr tablet Take 1 tablet (60 mg total) by mouth daily.  90 tablet  1  . LamoTRIgine (LAMICTAL ODT PO) Take 200 mg by mouth daily.       Marland Kitchen LORazepam (ATIVAN) 0.5 MG tablet       . Multiple Vitamin (MULITIVITAMIN WITH MINERALS) TABS Take 1 tablet by mouth daily.      . mycophenolate (CELLCEPT) 500 MG tablet Take 500 mg by mouth 2 (two) times daily.       . pantoprazole (PROTONIX) 40 MG tablet Take 1 tablet (40 mg total) by mouth daily.      . predniSONE (DELTASONE) 5 MG tablet Take 5 mg by mouth daily.        Marland Kitchen pyridostigmine (MESTINON) 60 MG tablet TAKE 2 TABLETS IN THE MORNING AND 1 TABLET IN THE AFTERNOON  90 tablet  2  . SPIRIVA HANDIHALER 18 MCG inhalation capsule INHALE CONTENTS OF 1 CAPSULE DAILY  30 capsule  4  . tacrolimus (PROGRAF) 5 MG capsule  Take 5 mg by mouth 2 (two) times daily.        . Tamsulosin HCl (FLOMAX) 0.4 MG CAPS Take 0.8 mg by mouth daily.       . traMADol (ULTRAM) 50 MG tablet       . Vilazodone HCl (VIIBRYD) 40 MG TABS Take 20 mg by mouth daily.       . vitamin C (ASCORBIC ACID) 500 MG tablet Take 1,000 mg by mouth daily.       . furosemide (LASIX) 40 MG tablet Take 2 tablets (total 80mg ) two times a day for 4 days, then take 2 tablets (total 80mg )  in the morning and 1 tablet in the afternoon.  120 tablet  6  . potassium chloride SA (K-DUR,KLOR-CON) 20 MEQ tablet Take 1 tablet (20 mEq total) by mouth daily.  30 tablet  6  . [DISCONTINUED] Calcium Carb-Cholecalciferol (CALCIUM 1000 + D PO) Take 1 tablet by mouth daily.       No current facility-administered medications for this visit.    BP 128/70  Pulse 71  Ht 5\' 11"  (1.803 m)  Wt 220 lb (99.791 kg)  BMI 30.7 kg/m2 General: NAD Neck: JVP 10-12 cm, no thyromegaly or thyroid nodule.  Lungs: Distant breath sounds bilaterally. CV: Nondisplaced PMI.  Heart regular S1/S2, no S3/S4, 3/6 systolic murmur RUSB and apex.  1-2+ edema to knee on right, 1+ edema 1/2 up on left.  Right carotid bruit.  Abdomen: Soft, nontender, no hepatosplenomegaly, no distention.  Neurologic: Alert and oriented x 3.  Psych: Normal affect Extremities: No clubbing or cyanosis.   Assessment/Plan:  Chronic diastolic heart failure  John [name] volume overloaded on exam today with NYHA class III symptoms including orthopnea. Murmur also sounds louder than on prior exams.  - Echocardiogram to reassess EF and valves (with louder murmur).  - Increase Lasix to 80 mg bid x 4 days, then  take Lasix 80 qam, 40 qpm.   - Start KCl 20 mEq daily.  - Check BMET/BNP in 7 days.  COPD  Severe.  He is on oxygen all the time now.  Follows with Dr. Delford Field.  CORONARY ATHEROSLERO UNSPEC TYPE BYPASS GRAFT  Ungrafted PDA seen on last cath could be a source for ischemia. This a small caliber vessel and  not amenable to intervention. He is having no chest pain currently.  He will continue ASA 81, statin, Imdur, Plavix. No beta blocker or ACEI due to orthostatic hypotension.  HYPERLIPIDEMIA-MIXED  Check lipids today.  Continue statin.   Orthostatic hypotension Shy-Drager syndrome. Continue pyridostigmine. He is not having significant orthostatic symptoms at this time.  PVD WITH CLAUDICATION  No significant leg pain but he has had ulcers on his left foot.  I will get ABIs to follow peripheral circulation.  Rhythm ECG showed Type I 2nd degree AV block.  I am going to get a 48 hour holter monitor to screen for any higher degree heart block or pauses.   [name] 05/13/2012 2:01 PM

## 2012-05-13 NOTE — Patient Instructions (Addendum)
Increase lasix (furosemide) to 80mg  two times a day for 4 days, then take lasix(furosemide) 80mg  in the morning and 40mg  in the afternoon. This will be two of your 40mg  tablets two times a day for 4 days, then 2 of your 40mg  tablets in the morning and 1 of your 40 mg tablets in the afternoon.   Increase KCL(potassium) to 20 mEq daily.  Your physician recommends that you return for a FASTING lipid profile /BMET/BNP in 1 week.     Your physician has requested that you have an echocardiogram. Echocardiography is a painless test that uses sound waves to create images of your heart. It provides your doctor with information about the size and shape of your heart and how well your heart's chambers and valves are working. This procedure takes approximately one hour. There are no restrictions for this procedure.    Your physician has requested that you have a lower  extremity arterial duplex. This test is an ultrasound of the arteries in the legs. It looks at arterial blood flow in the legs. Allow one hour for Lower  Arterial scans. There are no restrictions or special instructions.   Your physician has recommended that you wear a holter monitor. Holter monitors are medical devices that record the heart's electrical activity. Doctors most often use these monitors to diagnose arrhythmias. Arrhythmias are problems with the speed or rhythm of the heartbeat. The monitor is a small, portable device. You can wear one while you do your normal daily activities. This is usually used to diagnose what is causing palpitations/syncope (passing out). 48 hour holter monitor  Your physician recommends that you schedule a follow-up appointment in: about 2 weeks with Dr Shirlee Latch after all your testing has been completed.

## 2012-05-14 ENCOUNTER — Encounter (INDEPENDENT_AMBULATORY_CARE_PROVIDER_SITE_OTHER): Payer: Medicare Other

## 2012-05-14 DIAGNOSIS — I441 Atrioventricular block, second degree: Secondary | ICD-10-CM

## 2012-05-14 DIAGNOSIS — I739 Peripheral vascular disease, unspecified: Secondary | ICD-10-CM

## 2012-05-14 DIAGNOSIS — I5032 Chronic diastolic (congestive) heart failure: Secondary | ICD-10-CM

## 2012-05-20 ENCOUNTER — Other Ambulatory Visit (INDEPENDENT_AMBULATORY_CARE_PROVIDER_SITE_OTHER): Payer: Medicare Other

## 2012-05-20 ENCOUNTER — Ambulatory Visit (INDEPENDENT_AMBULATORY_CARE_PROVIDER_SITE_OTHER): Payer: Medicare Other

## 2012-05-20 ENCOUNTER — Ambulatory Visit (HOSPITAL_COMMUNITY): Payer: Medicare Other | Attending: Cardiology | Admitting: Radiology

## 2012-05-20 DIAGNOSIS — I5032 Chronic diastolic (congestive) heart failure: Secondary | ICD-10-CM

## 2012-05-20 DIAGNOSIS — I459 Conduction disorder, unspecified: Secondary | ICD-10-CM | POA: Insufficient documentation

## 2012-05-20 DIAGNOSIS — I10 Essential (primary) hypertension: Secondary | ICD-10-CM | POA: Insufficient documentation

## 2012-05-20 DIAGNOSIS — I509 Heart failure, unspecified: Secondary | ICD-10-CM

## 2012-05-20 DIAGNOSIS — I441 Atrioventricular block, second degree: Secondary | ICD-10-CM

## 2012-05-20 DIAGNOSIS — I4891 Unspecified atrial fibrillation: Secondary | ICD-10-CM | POA: Insufficient documentation

## 2012-05-20 DIAGNOSIS — J449 Chronic obstructive pulmonary disease, unspecified: Secondary | ICD-10-CM | POA: Insufficient documentation

## 2012-05-20 DIAGNOSIS — J4489 Other specified chronic obstructive pulmonary disease: Secondary | ICD-10-CM | POA: Insufficient documentation

## 2012-05-20 DIAGNOSIS — R0602 Shortness of breath: Secondary | ICD-10-CM

## 2012-05-20 DIAGNOSIS — E119 Type 2 diabetes mellitus without complications: Secondary | ICD-10-CM | POA: Insufficient documentation

## 2012-05-20 LAB — LIPID PANEL
LDL Cholesterol: 40 mg/dL (ref 0–99)
Total CHOL/HDL Ratio: 2
Triglycerides: 49 mg/dL (ref 0.0–149.0)
VLDL: 9.8 mg/dL (ref 0.0–40.0)

## 2012-05-20 LAB — BASIC METABOLIC PANEL
BUN: 18 mg/dL (ref 6–23)
Calcium: 8.9 mg/dL (ref 8.4–10.5)
GFR: 103.9 mL/min (ref >60.00)
Glucose, Bld: 103 mg/dL — ABNORMAL HIGH (ref 70–99)

## 2012-05-20 NOTE — Progress Notes (Signed)
Echocardiogram performed.  

## 2012-05-20 NOTE — Progress Notes (Signed)
Placed a 30 day event monitor on patient and went over instructions on how to use it and when to return it 

## 2012-05-21 ENCOUNTER — Telehealth: Payer: Self-pay | Admitting: Cardiology

## 2012-05-21 NOTE — Telephone Encounter (Signed)
New problem   Pt stated John [name] call him to give results of test

## 2012-05-21 NOTE — Telephone Encounter (Signed)
Called patient with all test results.

## 2012-05-21 NOTE — Telephone Encounter (Signed)
Follow-up:    Patient called in returning John [name]'s call regarding his latest ECHO results.  Please call back.

## 2012-05-22 ENCOUNTER — Ambulatory Visit: Payer: Medicare Other | Admitting: Physical Therapy

## 2012-05-22 ENCOUNTER — Ambulatory Visit: Payer: Medicare Other | Attending: Physical Medicine & Rehabilitation | Admitting: Occupational Therapy

## 2012-05-22 DIAGNOSIS — R5381 Other malaise: Secondary | ICD-10-CM | POA: Insufficient documentation

## 2012-05-22 DIAGNOSIS — R269 Unspecified abnormalities of gait and mobility: Secondary | ICD-10-CM | POA: Insufficient documentation

## 2012-05-22 DIAGNOSIS — Z5189 Encounter for other specified aftercare: Secondary | ICD-10-CM | POA: Insufficient documentation

## 2012-05-22 NOTE — Telephone Encounter (Signed)
Opened in error

## 2012-05-23 ENCOUNTER — Ambulatory Visit: Payer: Medicare Other | Admitting: Physical Therapy

## 2012-05-27 ENCOUNTER — Ambulatory Visit: Payer: Medicare Other | Admitting: Occupational Therapy

## 2012-05-27 ENCOUNTER — Ambulatory Visit: Payer: Medicare Other | Admitting: Physical Therapy

## 2012-05-28 ENCOUNTER — Encounter: Payer: Self-pay | Admitting: Physical Medicine & Rehabilitation

## 2012-05-28 ENCOUNTER — Encounter: Payer: Medicare Other | Attending: Physical Medicine & Rehabilitation

## 2012-05-28 ENCOUNTER — Ambulatory Visit (HOSPITAL_BASED_OUTPATIENT_CLINIC_OR_DEPARTMENT_OTHER): Payer: Medicare Other | Admitting: Physical Medicine & Rehabilitation

## 2012-05-28 VITALS — BP 124/61 | HR 65 | Resp 14 | Ht 71.0 in | Wt 220.0 lb

## 2012-05-28 DIAGNOSIS — M216X9 Other acquired deformities of unspecified foot: Secondary | ICD-10-CM

## 2012-05-28 DIAGNOSIS — R5381 Other malaise: Secondary | ICD-10-CM

## 2012-05-28 DIAGNOSIS — Z79899 Other long term (current) drug therapy: Secondary | ICD-10-CM | POA: Insufficient documentation

## 2012-05-28 DIAGNOSIS — R269 Unspecified abnormalities of gait and mobility: Secondary | ICD-10-CM | POA: Insufficient documentation

## 2012-05-28 DIAGNOSIS — R609 Edema, unspecified: Secondary | ICD-10-CM | POA: Insufficient documentation

## 2012-05-28 DIAGNOSIS — G20 Parkinson's disease: Secondary | ICD-10-CM | POA: Insufficient documentation

## 2012-05-28 DIAGNOSIS — G20A1 Parkinson's disease without dyskinesia, without mention of fluctuations: Secondary | ICD-10-CM | POA: Insufficient documentation

## 2012-05-28 DIAGNOSIS — R209 Unspecified disturbances of skin sensation: Secondary | ICD-10-CM | POA: Insufficient documentation

## 2012-05-28 DIAGNOSIS — M21371 Foot drop, right foot: Secondary | ICD-10-CM

## 2012-05-28 NOTE — Progress Notes (Signed)
Subjective:    Patient ID: John Novel., male    DOB: 04/16/1940, 72 y.o.   MRN: [id number]  HPI HPI  Developed another Left heel ulcer being treated by podiatry. Doing better next visit 3 mo for nails.  CIR rehab Attended by myself followed [facility] ~5 months  Currently walks with walker at home.  Back surgery in March 2013, post op vent failure.  R AFO To be fitted Thursday, 05/30/2012   Started physical therapy. Neuro rehabilitation twice a week OT twice a week LBP occ  Pain Inventory Average Pain 3 Pain Right Now 3 My pain is dull  In the last 24 hours, has pain interfered with the following? General activity 4 Relation with others 4 Enjoyment of life 4 What TIME of day is your pain at its worst? evening Sleep (in general) Good  Pain is worse with: bending Pain improves with: heat/ice Relief from Meds: 0  Mobility use a walker ability to climb steps?  no do you drive?  no Do you have any goals in this area?  yes  Function retired I need assistance with the following:  dressing and bathing Do you have any goals in this area?  yes  Neuro/Psych tremor trouble walking depression anxiety  Prior Studies Any changes since last visit?  no  Physicians involved in your care Any changes since last visit?  no   Family History  Problem Relation Age of Onset  . Coronary artery disease Mother   . Valvular heart disease Father   . Hepatitis      sibling  . Coronary artery disease Sister   . Heart attack Sister   . Tuberculosis Father    History   Social History  . Marital Status: Married    Spouse Name: N/A    Number of Children: 3  . Years of Education: N/A   Occupational History  . retired Clinical research associate    Social History Main Topics  . Smoking status: Former Smoker -- 2.00 packs/day for 20 years    Types: Cigarettes    Quit date: 02/20/1978  . Smokeless tobacco: Never Used     Comment: smoked 1 ppd, stopped 1980  . Alcohol Use:  No  . Drug Use: No  . Sexually Active: None   Other Topics Concern  . None   Social History Narrative  . None   Past Surgical History  Procedure Laterality Date  . Coronary artery bypass graft  03/2009  . Cataract extraction    . Vitrectomy    . Tonsillectomy    . Kidney transplant  2002  . Hernia repair    . Prostate cancer rx 2006 brachytherapy    . Prior peritoneal catheter placement    . Lumbar laminectomy/decompression microdiscectomy  05/16/2011    Procedure: LUMBAR LAMINECTOMY/DECOMPRESSION MICRODISCECTOMY 1 LEVEL;  Surgeon: Barnett Abu, MD;  Location: [facility];  Service: Neurosurgery;  Laterality: Bilateral;  Bilateral Lumbar Five-Sacral One Laminectomy   Past Medical History  Diagnosis Date  . CAD (coronary artery disease)     a. myoview 1/11: lg inf fixed defect;   b. cath 1/11: 3v CAD,  c. s/p CABG 1/11 c/b acute renal failure and AFib  . Atrial fibrillation   . Chronic diastolic heart failure     a. echo 4/11: EF 45-50%, mod LVH, LAE, inf and septal HK  . First degree AV block   . Orthostatic hypotension     pyridostigmine  . History  of renal transplant   . DM2 (diabetes mellitus, type 2)   . Diabetic gastroparesis   . HTN (hypertension)   . HLD (hyperlipidemia)   . PAD (peripheral artery disease)     s/p R SFA-Pop atherectomy 11/11  . Prostate cancer   . ED (erectile dysfunction)   . DDD (degenerative disc disease), lumbar   . Tremor   . Obesity   . Low testosterone   . Chronic kidney disease     s/p renal transplant 2002  . Pleural effusion, right     loculated  . COPD (chronic obstructive pulmonary disease)   . Anxiety   . Diabetes mellitus     insulin pump   BP 124/61  Pulse 65  Resp 14  Ht 5\' 11"  (1.803 m)  Wt 220 lb (99.791 kg)  BMI 30.7 kg/m2  SpO2 97%      Review of Systems  Respiratory: Positive for shortness of breath.   Cardiovascular: Positive for leg swelling.  Musculoskeletal: Positive for gait problem.  Neurological:  Positive for tremors.  Psychiatric/Behavioral: Positive for dysphoric mood and agitation.  All other systems reviewed and are negative.       Objective:   Physical Exam  4+/5 strength in bilateral deltoid, biceps, triceps, grip 4/5 in bilateral hip flexors and knee extensors 0 at the right ankle dorsiflexor 2 at the right ankle plantar flexor 3+ at the left ankle dorsiflexor and plantar flexor  Psychiatric: Thought content normal. His mood appears anxious. His speech is delayed. He is slowed.  Orientation to person, place, month,not year       Assessment & Plan:  1. Multifactor gait disorder.  A. Parkinson's disease which may be negatively affected by the Risperdal His does was reduced from 4.5 mg 3 mg. He plans to followup with psychiatry and hopefully reduce it again next time  B. Deconditioning after 5 month nursing home stay  C. Bilateral foot drop With poorly fitting Right AFO.Because of fluctuating edema, he requires double metal upright AFO. He may benefit from bilateral AFOs  D. Probable diabetic neuropathy Causing numbness in the hands and feet as well as bilateral foot drop  Even though he is on hold for physical therapy at this time, I feel he would benefit from occupational therapy to work on assisted devices as well as upper extremity strengthening. He may be cleared for PT this week as well.  I'll see him back in about one month's time. I asked the patient to bring his walker for next visit as well as his new AFO

## 2012-05-28 NOTE — Patient Instructions (Addendum)
Walker and new brace to next visit

## 2012-05-29 ENCOUNTER — Ambulatory Visit: Payer: Medicare Other | Admitting: Occupational Therapy

## 2012-05-29 ENCOUNTER — Ambulatory Visit: Payer: Medicare Other | Admitting: Physical Therapy

## 2012-05-29 ENCOUNTER — Encounter: Payer: Self-pay | Admitting: Cardiology

## 2012-05-29 ENCOUNTER — Ambulatory Visit (INDEPENDENT_AMBULATORY_CARE_PROVIDER_SITE_OTHER): Payer: Medicare Other | Admitting: Cardiology

## 2012-05-29 ENCOUNTER — Other Ambulatory Visit: Payer: Self-pay | Admitting: Nurse Practitioner

## 2012-05-29 VITALS — BP 140/60 | HR 65 | Ht 71.0 in | Wt 220.0 lb

## 2012-05-29 DIAGNOSIS — I25810 Atherosclerosis of coronary artery bypass graft(s) without angina pectoris: Secondary | ICD-10-CM

## 2012-05-29 DIAGNOSIS — I5032 Chronic diastolic (congestive) heart failure: Secondary | ICD-10-CM

## 2012-05-29 DIAGNOSIS — I7389 Other specified peripheral vascular diseases: Secondary | ICD-10-CM

## 2012-05-29 DIAGNOSIS — I951 Orthostatic hypotension: Secondary | ICD-10-CM

## 2012-05-29 DIAGNOSIS — E785 Hyperlipidemia, unspecified: Secondary | ICD-10-CM

## 2012-05-29 DIAGNOSIS — J449 Chronic obstructive pulmonary disease, unspecified: Secondary | ICD-10-CM

## 2012-05-29 MED ORDER — POTASSIUM CHLORIDE CRYS ER 20 MEQ PO TBCR: 20.0000 meq | EXTENDED_RELEASE_TABLET | Freq: Two times a day (BID) | ORAL | Status: DC

## 2012-05-29 MED ORDER — FUROSEMIDE 40 MG PO TABS: ORAL_TABLET | ORAL | Status: DC

## 2012-05-29 NOTE — Patient Instructions (Addendum)
Increase lasix(furosemide) to 80 mg two times a day. This will be 2 of your 40mg  tablets two times a day.  Increase KCL(potassium) to 20 mEq two times a day.   Your physician recommends that you return for lab work in: about 2 weeks--BMET/BNP. Please fax a copy of the results to Dr Shirlee Latch. [phone]  Your physician recommends that you schedule a follow-up appointment in: 1 month with Dr Shirlee Latch. This is scheduled for Tuesday May 6,2014 at 3PM.

## 2012-05-30 NOTE — Progress Notes (Signed)
Patient ID: John [name]., male   DOB: August 28, 1940, 72 y.o.   MRN: [id number] PCP: Dr. [name]  72 yo with history of renal transplant, DM, HTN, and CAD s/p CABG returns for followup.  Patient had a long and complicated hospital admission in 1/11.  Patient was admitted to [facility] at that time with a UTI and pulmonary edema.  Troponin was noted to be elevated and the patient had a nuclear scan with a large fixed inferior defect.  He ended up having a cardiac catheterization showing severe disease involving the LM, LAD, ramus, CFX and RCA.  He had CABG x 5.  Post-op course was complicated by pulmonary edema requiring intubation, renal failure requiring CVVH, and atrial fibrillation.  He was in the hospital about 7 weeks.  In 11/11, patient had right SFA and popliteal atherectomy that was complicated by a left groin pseudoaneurysm.   In the fall of 2012, John [name] reported worsening orthostatic-type lightheadedness and worsening exertional dyspnea.  I decided at that point to take him for right and left heart cath in 10/12.  On Lasix, his right and left heart filling pressures were not significantly elevated.  His bypass grafts were patent but he had an ungrafted PDA with 95% stenosis (small vessel that would not be amenable to PCI).  As this was a possible source of ischemia and could potentially cause exertional dyspnea (his anginal equivalent was dyspnea in the past), I started him on Imdur.   I also took him off lisinopril due to worsening orthostatic symptoms.  He has been on pyridostigmine for orthostatic hypotension with supine hypertension.   In 3/13, John [name] had back surgery.  Post-operative course was complicated by fluid retention and respiratory failure requiring intubation.  He developed PNA and delirium.  He had a prolonged hospitalization and rehabilitation.  He continues home PT.  He has been having difficulty walking due to poor balance and right foot drop.  He is wearing a brace which  has helped with the foot drop.  He walks with a walker.  At last appointment, he was noted to have gained weight and to be short of breath with only minimal exertion with his walker.  He appeared volume overloaded.  I increased his Lasix to 80 qam, 40 qpm.  He has less leg swelling now but not much improvement in his breathing.  Weight is stable but not decreased.  Echo showed normal EF, moderate LVH, and moderate aortic stenosis.  At last appointment, ECG showed type I 2nd degree AV block.  This has been seen periodically in the past.  I had him wear a holter for 48 hrs to make sure that no more significant arrhythmia was present. The holter showed type I 2nd degree AV block.   Labs (9/11): LDL 71, HDL 45 Labs (12/11): K 4.9, creatinine 0.9, HCT 37.1  Labs (5/12): K 4.9, creatinine 0.9, BNP 157, HCT 36.4 Labs (5/12): LDL 42, HDL 44, K 3.7, creatinine 1.1, BNP 105 Labs (6/12): K 5, creatinine 1.1, TSH normal Labs (10/12): LDL 53, HDL 47, K 4.3, creatinine 7.82, BNP 197 Labs (12/12): K 4.1, creatinine 1.3 Labs (1/13): K 3.6, creatinine 0.96 Labs (5/13): K 4, creatinine 0.83 Labs (7/13): creatinine 0.7 Labs (12/13): K 3.7, creatinine 0.78 Labs (3/14): LDL 40, HDL 40, K 3.8, creatinine 0.8, BNP 513  Allergies (verified):  No Known Drug Allergies  Past Medical History: 1. Diabetes mellitus 2. CKD s/p renal transplant 2002 3. HTN 4. PAD: atherectomy  right SFA and right popliteal on 01/12/10.  Complicated by left groin pseudoaneurysm.  ABIs (3/14) with 0.85 R, 0.76 L (stable).  5. Lumbar disc disease with low back pain 6. Diabetic gastroparesis 7. Prostate cancer s/p seed implantation in 1/06.  8. Erectile dysfunction 9. Tremor 10.  Obesity 11.  Diastolic CHF: Echo (6/12) showed EF 60%, moderate LV hypertrophy, very mild AS, normal RV size and systolic function.  Echo (3/13) with EF 55%, possible inferior hypokinesis, aortic sclerosis, mildly dilated RV with mildly decreased RV systolic  function.  Echo (3/14): EF 55-60%, moderate LVH, inferior hypokinesis, mild John, mild RV dilation, moderate aortic stenosis (mean gradient 26 mmHg/peak gradient 44 mmHg).  12.  CAD: Lexiscan myoview (2/11) with EF 44%,  fixed inferior defect.  Cath (2/11) with 60% distal left main, 90% ostial LAD involving D1, 95% ostial large ramus, probable significant ostial CFX stenosis, 90% PDA, 70% PLV.  Paitent had CABG with LIMA-LAD, SVG-D, sequential SVG-ramus and OM2, SVG-PLV.  Lexiscan myoview (5/12) with EF 59%, partially reversible inferior defect.  LHC/RHC (10/12): RA 6, PA 38/12, PCWP 9, CI 2.8; SVG-OM and ramus patent, LIMA-LAD patent, SVG-D1 patent with 60% stenosis at touchdown, SVG-PLV patent; there was a 95% PDA stenosis (seen on study prior to CABG) that was ungrafted.  The PDA was a small caliber vessel and not well-suited to PCI.  This could be a source of exertional symptoms.   13.  Atrial fibrillation post-op CABG 14.  Shy-Drager syndrome with orthostatic hypotension:  improvement with pyridostigmine.  15.  Low testosterone 16.  Loculated right>left pleural effusion: Right thoracentesis (6/12), cytology negative for malignancy.  17.  COPD: Followed by Dr Delford Field, now on home oxygen.    18.  Carotid dopplers (6/12): 0-39% bilateral ICA stenosis.  19.  Depression 20.  Lumbar disc disease 21.  Heart block: Patient has had profound 1st degree AV block on ECG.  Holter (11/12) showed NSR, 1st degree heart block, occasional short runs of type I 2nd degree heart block.  Holter (3/14) showed type I 2nd degree AV block.  22. Aortic stenosis: Moderate on 3/14 echo.   Family History: Noncontributory  Social History: Married, retired Clinical research associate, lives in [place].  Prior smoker.  ROS: All systems reviewed and negative except as per HPI.    Current Outpatient Prescriptions  Medication Sig Dispense Refill  . albuterol (PROVENTIL HFA;VENTOLIN HFA) 108 (90 BASE) MCG/ACT inhaler Inhale 2 puffs into the  lungs every 6 (six) hours as needed for wheezing or shortness of breath.  1 Inhaler  4  . aspirin 81 MG tablet Take 81 mg by mouth daily.      Marland Kitchen atorvastatin (LIPITOR) 40 MG tablet Take 40 mg by mouth daily.       . calcium carbonate (OS-CAL - DOSED IN MG OF ELEMENTAL CALCIUM) 1250 MG tablet Take 2 tablets by mouth daily.      . carbidopa-levodopa (SINEMET CR) 50-200 MG per tablet Take 1/2 tablet by mouth every morning *DO NOT CRUSH OR CHEW*, take 1 tablet twice before lunch and dinner      . Cholecalciferol (VITAMIN D-3) 5000 UNITS TABS Take 2 tablets by mouth 3 (three) times a week.      . clindamycin (CLEOCIN T) 1 % lotion as needed. Apply as directed daily      . clobetasol (OLUX) 0.05 % topical foam Apply 1 application topically daily as needed. For head      . clopidogrel (PLAVIX) 75 MG tablet Take 1  tablet (75 mg total) by mouth daily.  30 tablet  5  . eszopiclone (LUNESTA) 2 MG TABS Take 2 mg by mouth at bedtime. Take immediately before bedtime      . ezetimibe (ZETIA) 10 MG tablet Take 10 mg by mouth daily.        . Ferrous Sulfate (IRON) 325 (65 FE) MG TABS Take 1 tablet by mouth daily.       . Fluticasone-Salmeterol (ADVAIR) 250-50 MCG/DOSE AEPB Inhale 1 puff into the lungs daily.      . folic acid (FOLVITE) 1 MG tablet Take 1 mg by mouth daily.      Marland Kitchen GLUCAGEN HYPOKIT 1 MG SOLR Use as directed if needed      . HYDROcodone-acetaminophen (NORCO/VICODIN) 5-325 MG per tablet Take 1 tablet by mouth as needed.      . insulin aspart (NOVOLOG) 100 UNIT/ML injection Inject into the skin. Administered via insulin pump      . isosorbide mononitrate (IMDUR) 60 MG 24 hr tablet Take 1 tablet (60 mg total) by mouth daily.  90 tablet  1  . LamoTRIgine (LAMICTAL ODT PO) Take 200 mg by mouth daily.       Marland Kitchen LORazepam (ATIVAN) 0.5 MG tablet       . Multiple Vitamin (MULITIVITAMIN WITH MINERALS) TABS Take 1 tablet by mouth daily.      . mycophenolate (CELLCEPT) 500 MG tablet Take 500 mg by mouth 2 (two)  times daily.       . pantoprazole (PROTONIX) 40 MG tablet Take 1 tablet (40 mg total) by mouth daily.      . predniSONE (DELTASONE) 5 MG tablet Take 5 mg by mouth daily.        Marland Kitchen pyridostigmine (MESTINON) 60 MG tablet TAKE 2 TABLETS IN THE MORNING AND 1 TABLET IN THE AFTERNOON  90 tablet  2  . SPIRIVA HANDIHALER 18 MCG inhalation capsule INHALE CONTENTS OF 1 CAPSULE DAILY  30 capsule  4  . tacrolimus (PROGRAF) 5 MG capsule Take 5 mg by mouth 2 (two) times daily.        . Tamsulosin HCl (FLOMAX) 0.4 MG CAPS Take 0.8 mg by mouth daily.       . traMADol (ULTRAM) 50 MG tablet       . Vilazodone HCl (VIIBRYD) 40 MG TABS Take 20 mg by mouth daily.       . vitamin C (ASCORBIC ACID) 500 MG tablet Take 1,000 mg by mouth daily.       . furosemide (LASIX) 40 MG tablet 2 tablets (total 80mg ) two times a day  120 tablet  6  . potassium chloride SA (K-DUR,KLOR-CON) 20 MEQ tablet Take 1 tablet (20 mEq total) by mouth 2 (two) times daily.  60 tablet  6  . [DISCONTINUED] Calcium Carb-Cholecalciferol (CALCIUM 1000 + D PO) Take 1 tablet by mouth daily.       No current facility-administered medications for this visit.    BP 140/60  Pulse 65  Ht 5\' 11"  (1.803 m)  Wt 220 lb (99.791 kg)  BMI 30.7 kg/m2  SpO2 96% General: NAD Neck: JVP 10 cm, no thyromegaly or thyroid nodule.  Lungs: Distant breath sounds bilaterally. CV: Nondisplaced PMI.  Heart regular S1/S2, no S3/S4, 3/6 systolic murmur RUSB and apex.  1+ edema to knee on right, 1+ edema 1/2 up on left.  Right carotid bruit.  Abdomen: Soft, nontender, no hepatosplenomegaly, no distention.  Neurologic: Alert and oriented x 3.  Psych:  Normal affect Extremities: No clubbing or cyanosis.   Assessment/Plan:  Chronic diastolic heart failure  John Fennell remains volume overloaded on exam today with NYHA class III symptoms.  EF was stable on echo.  His weight did not go down with increased Lasix.  Creatinine remained stable.  - Increase Lasix to 80 mg bid.     - Increase KCl to 40 mEq daily.   - Check BMET/BNP in 2 wks. COPD  Severe.  He is on oxygen all the time now.  Follows with Dr. Delford Field.  CAD s/p CABG  Ungrafted PDA seen on last cath could be a source for ischemia. This a small caliber vessel and not amenable to intervention. He is having no chest pain currently.  He will continue ASA 81, statin, Imdur, Plavix. No beta blocker or ACEI due to orthostatic hypotension.  HYPERLIPIDEMIA-MIXED  Good lipids in 3/14.  Continue statin.   Orthostatic hypotension Shy-Drager syndrome. Continue pyridostigmine. He is not having significant orthostatic symptoms at this time.  PVD WITH CLAUDICATION  ABIs stable when checked last month.  0.85 on right, 0.76 on left.  He is not having claudication and has no pedal ulcers.   Rhythm 48 hour holter showed frequent type I 2nd degree AV block but no higher grade block or pauses.   [name] 05/30/2012 8:20 AM

## 2012-05-31 ENCOUNTER — Telehealth: Payer: Self-pay | Admitting: *Deleted

## 2012-05-31 DIAGNOSIS — M21372 Foot drop, left foot: Secondary | ICD-10-CM

## 2012-05-31 NOTE — Telephone Encounter (Signed)
I am aware that the patient does have left foot drop as well. You can give them the order for a left AFO as well

## 2012-05-31 NOTE — Telephone Encounter (Signed)
They went to [facility] to get the brace ordered for John [name]'s right foot and it was noticed that his left foot ankle roll is much more pronounced.  They need something to help control this.

## 2012-06-05 ENCOUNTER — Ambulatory Visit: Payer: Medicare Other | Admitting: Physical Therapy

## 2012-06-06 ENCOUNTER — Telehealth: Payer: Self-pay | Admitting: Critical Care Medicine

## 2012-06-06 NOTE — Telephone Encounter (Signed)
Noted and thanks.

## 2012-06-06 NOTE — Telephone Encounter (Signed)
Called spoke with Amy: Has been seeing patient intermittently x4 weeks sats are dropping even with basic activity and taking long rest breaks Pt wears 4L continuously When pt arrives at PT, sats are 91-93% With simple transfer, drops to 77% With short-distance walking, drops to 71% Pt's lasix was increased by DrMcLean to 80mg  BID - no difference in dyspnea or LE edema HR only slightly elevated at 102 Pt is not dyspneic until ambulating; not acute and appears to be doing well otherwise Amy requests that any restrictions be faxed   Called spoke with patient's spouse - advised pt needs ov Per spouse, they are going out of town next Tuesday 4.22.14 and has a prior appt on 4.21.14 at 2:30pm Requests an AM appt PW not in office next week Ov with [facility] scheduled for Monday 4.21.14 @ 10:00am Advised wife to seek emergency care if pt's breathing worsens over the weekend Will sign off and forward to PW as FYI

## 2012-06-07 ENCOUNTER — Telehealth: Payer: Self-pay | Admitting: *Deleted

## 2012-06-07 NOTE — Telephone Encounter (Signed)
error 

## 2012-06-10 ENCOUNTER — Ambulatory Visit: Payer: Medicare Other | Admitting: *Deleted

## 2012-06-10 ENCOUNTER — Encounter: Payer: Self-pay | Admitting: Pulmonary Disease

## 2012-06-10 ENCOUNTER — Ambulatory Visit (INDEPENDENT_AMBULATORY_CARE_PROVIDER_SITE_OTHER): Payer: Medicare Other | Admitting: Pulmonary Disease

## 2012-06-10 VITALS — BP 122/58 | HR 76 | Temp 98.3°F | Ht 73.0 in | Wt 220.0 lb

## 2012-06-10 DIAGNOSIS — J441 Chronic obstructive pulmonary disease with (acute) exacerbation: Secondary | ICD-10-CM

## 2012-06-10 DIAGNOSIS — J449 Chronic obstructive pulmonary disease, unspecified: Secondary | ICD-10-CM

## 2012-06-10 DIAGNOSIS — J4489 Other specified chronic obstructive pulmonary disease: Secondary | ICD-10-CM

## 2012-06-10 MED ORDER — CEFDINIR 300 MG PO CAPS: 300.0000 mg | ORAL_CAPSULE | Freq: Two times a day (BID) | ORAL | Status: DC

## 2012-06-10 MED ORDER — PREDNISONE 10 MG PO TABS: ORAL_TABLET | ORAL | Status: DC

## 2012-06-10 NOTE — Patient Instructions (Addendum)
Increase prednisone to 40mg  a day for 2 days, then 30mg  a day for 2 days, then 20mg  a day for 2 days, then 10mg  a day for 2 days, then back to your usual 5mg  a day. Will treat with omnicef 300mg , take 2 each day for 5 days. followup with me Dr. Delford Field in 2 weeks, but let us know if you are not getting better.

## 2012-06-10 NOTE — Assessment & Plan Note (Signed)
The patient has had increasing shortness of breath over the last 3 months, which has now culminated in falling oxygen saturations with exertional activities.  His diuretics have been increased by cardiology with no major change.  He is not having increased sputum quantity, but does have some change in character.  I will treat him like a COPD exacerbation to see if things improve.  If they do not, this may just represent the natural history of his disease, and he may not be able to tolerate pulsed oxygen with exertional activities.

## 2012-06-10 NOTE — Addendum Note (Signed)
Addended by: Nita Sells on: 06/10/2012 10:36 AM   Modules accepted: Orders

## 2012-06-10 NOTE — Progress Notes (Signed)
  Subjective:    Patient ID: John Novel., male    DOB: 1940/10/26, 72 y.o.   MRN: [id number]  HPI The patient comes in today for an acute sick visit.  He is known to have severe COPD with chronic respiratory failure, and also has underlying chronic diastolic heart failure.  He has had worsening shortness of breath over the last 3 months, and currently has been having falling oxygen saturations with exertional activities.  Cardiology has tried to increase his diuretics with no major change.  He does not have dyspnea at rest.  He has a mild chronic cough with mucus, but does not have increased quantity of mucus.  He has had some discoloration.  He denies any pleuritic chest pain, but does have chronic lower extremity edema.   Review of Systems  Constitutional: Negative for fever and unexpected weight change.  HENT: Positive for congestion. Negative for ear pain, nosebleeds, sore throat, rhinorrhea, sneezing, trouble swallowing, dental problem, postnasal drip and sinus pressure.   Eyes: Negative for redness and itching.  Respiratory: Positive for cough and shortness of breath. Negative for chest tightness and wheezing.   Cardiovascular: Positive for leg swelling. Negative for palpitations.  Gastrointestinal: Negative for nausea and vomiting.  Genitourinary: Negative for dysuria.  Musculoskeletal: Negative for joint swelling.  Skin: Negative for rash.  Neurological: Positive for headaches.  Hematological: Does not bruise/bleed easily.  Psychiatric/Behavioral: Negative for dysphoric mood. The patient is nervous/anxious.        Objective:   Physical Exam Frail-appearing male in no acute distress Nose without purulence or discharge noted Neck without lymphadenopathy or thyromegaly Chest with very decreased breath sounds throughout, no wheezing Cardiac exam with regular rate and rhythm, 3/6 systolic murmur Lower extremities with 1-2+ edema, no cyanosis Alert and oriented, moves all 4  extremities.       Assessment & Plan:

## 2012-06-17 ENCOUNTER — Ambulatory Visit: Payer: Medicare Other | Admitting: Occupational Therapy

## 2012-06-18 ENCOUNTER — Encounter: Payer: Self-pay | Admitting: Cardiology

## 2012-06-18 ENCOUNTER — Encounter: Payer: Self-pay | Admitting: Critical Care Medicine

## 2012-06-18 ENCOUNTER — Telehealth: Payer: Self-pay | Admitting: Cardiology

## 2012-06-18 ENCOUNTER — Telehealth: Payer: Self-pay

## 2012-06-18 ENCOUNTER — Ambulatory Visit (INDEPENDENT_AMBULATORY_CARE_PROVIDER_SITE_OTHER): Payer: Medicare Other | Admitting: Critical Care Medicine

## 2012-06-18 VITALS — BP 144/76 | HR 64 | Temp 98.8°F | Ht 73.0 in

## 2012-06-18 DIAGNOSIS — J441 Chronic obstructive pulmonary disease with (acute) exacerbation: Secondary | ICD-10-CM

## 2012-06-18 MED ORDER — UMECLIDINIUM-VILANTEROL 62.5-25 MCG/INH IN AEPB: 1.0000 | INHALATION_SPRAY | Freq: Every day | RESPIRATORY_TRACT | Status: DC

## 2012-06-18 MED ORDER — FLUTICASONE-SALMETEROL 250-50 MCG/DOSE IN AEPB: INHALATION_SPRAY | RESPIRATORY_TRACT | Status: DC

## 2012-06-18 MED ORDER — TIOTROPIUM BROMIDE MONOHYDRATE 18 MCG IN CAPS: ORAL_CAPSULE | RESPIRATORY_TRACT | Status: DC

## 2012-06-18 NOTE — Telephone Encounter (Signed)
Patients wife called to follow up on ankle brace order.  She requested this some time ago.  She would like [facility] to [facility]. Original order was faxed 06/03/12.  Will refax today.  Patient aware.

## 2012-06-18 NOTE — Assessment & Plan Note (Signed)
Gold stage D. COPD with recurrent exacerbations now improved Chronic hypoxemic respiratory failure Plan Switch Advair and Spiriva over to [name] one puff  daily (vilanterol/umeclidium, long acting LAMA/LABA)  rov one month

## 2012-06-18 NOTE — Telephone Encounter (Signed)
Walk in Pt Form " Advanced Notice Of Noncoverage" papers Dropped Off Gave to Anne/McLean  06/18/12/km

## 2012-06-18 NOTE — Patient Instructions (Addendum)
HOLD Spiriva and Advair Start and try ANORO one puff daily, call with report on how your breathing is doing Ok to use albuterol as needed Return 1 month

## 2012-06-18 NOTE — Progress Notes (Signed)
Subjective:    Patient ID: John Novel., male    DOB: 1940/11/13, 72 y.o.   MRN: [id number]  HPI 72 y.o.  with history of renal transplant on chronic immunosuppressants, DM-insulin dependent, HTN, , Chronic back pain and CAD s/p CABG . Patient had a long and complicated hospital admission in 1/11 s/p CABG x 5.   Post-op course was complicated by pulmonary edema requiring intubation, renal failure requiring CVVH, and atrial fibrillation. He was in the hospital about 7 weeks. In 11/11, patient had right SFA and popliteal atherectomy that was complicated by a groin pseudoaneurysm. Hx of orthostatic hypotension, [place], and severe tremor followed by [facility]   06/18/2012 Pt seen 06/10/12 by [facility] for flare of copd Rx pred/omniceph  This helped symptoms of dyspnea.  No real cough. No chest pains.  Swelling about the same. On lasix for swelling. Kidney works well.  Dyspnea is better now.  Now finished pred/abx     Past Medical History  Diagnosis Date  . CAD (coronary artery disease)     a. myoview 1/11: lg inf fixed defect;   b. cath 1/11: 3v CAD,  c. s/p CABG 1/11 c/b acute renal failure and AFib  . Atrial fibrillation   . Chronic diastolic heart failure     a. echo 4/11: EF 45-50%, mod LVH, LAE, inf and septal HK  . First degree AV block   . Orthostatic hypotension     pyridostigmine  . History of renal transplant   . DM2 (diabetes mellitus, type 2)   . Diabetic gastroparesis   . HTN (hypertension)   . HLD (hyperlipidemia)   . PAD (peripheral artery disease)     s/p R SFA-Pop atherectomy 11/11  . Prostate cancer   . ED (erectile dysfunction)   . DDD (degenerative disc disease), lumbar   . Tremor   . Obesity   . Low testosterone   . Chronic kidney disease     s/p renal transplant 2002  . Pleural effusion, right     loculated  . COPD (chronic obstructive pulmonary disease)   . Anxiety   . Diabetes mellitus     insulin pump     Family History  Problem Relation Age of  Onset  . Coronary artery disease Mother   . Valvular heart disease Father   . Hepatitis      sibling  . Coronary artery disease Sister   . Heart attack Sister   . Tuberculosis Father      History   Social History  . Marital Status: Married    Spouse Name: N/A    Number of Children: 3  . Years of Education: N/A   Occupational History  . retired Clinical research associate    Social History Main Topics  . Smoking status: Former Smoker -- 2.00 packs/day for 20 years    Types: Cigarettes    Quit date: 02/20/1978  . Smokeless tobacco: Never Used     Comment: smoked 1 ppd, stopped 1980  . Alcohol Use: No  . Drug Use: No  . Sexually Active: Not on file   Other Topics Concern  . Not on file   Social History Narrative  . No narrative on file     No Known Allergies   Outpatient Prescriptions Prior to Visit  Medication Sig Dispense Refill  . albuterol (PROVENTIL HFA;VENTOLIN HFA) 108 (90 BASE) MCG/ACT inhaler Inhale 2 puffs into the lungs every 6 (six) hours as needed for wheezing or shortness of breath.  1 Inhaler  4  . aspirin 81 MG tablet Take 81 mg by mouth daily.      Marland Kitchen atorvastatin (LIPITOR) 40 MG tablet Take 40 mg by mouth daily.       . calcium carbonate (OS-CAL - DOSED IN MG OF ELEMENTAL CALCIUM) 1250 MG tablet Take 2 tablets by mouth daily.      . carbidopa-levodopa (SINEMET CR) 50-200 MG per tablet TAKE ONE-HALF TABLET IN THE MORNING, ONE TABLET AT 11AM BEFORE LUNCHTIME AND ONE TABLET AT 5PM BEFORE DINNER.  75 tablet  7  . Cholecalciferol (VITAMIN D-3) 5000 UNITS TABS Take 2 tablets by mouth 3 (three) times a week.      . clindamycin (CLEOCIN T) 1 % lotion as needed. Apply as directed daily      . clobetasol (OLUX) 0.05 % topical foam Apply 1 application topically daily as needed. For head      . clopidogrel (PLAVIX) 75 MG tablet Take 1 tablet (75 mg total) by mouth daily.  30 tablet  5  . eszopiclone (LUNESTA) 2 MG TABS Take 2 mg by mouth at bedtime. Take immediately before bedtime       . ezetimibe (ZETIA) 10 MG tablet Take 10 mg by mouth daily.        . Ferrous Sulfate (IRON) 325 (65 FE) MG TABS Take 1 tablet by mouth daily.       . folic acid (FOLVITE) 1 MG tablet Take 1 mg by mouth daily.      . furosemide (LASIX) 40 MG tablet 2 tablets (total 80mg ) two times a day  120 tablet  6  . GLUCAGEN HYPOKIT 1 MG SOLR Use as directed if needed      . insulin aspart (NOVOLOG) 100 UNIT/ML injection Inject into the skin. Administered via insulin pump      . isosorbide mononitrate (IMDUR) 60 MG 24 hr tablet Take 1 tablet (60 mg total) by mouth daily.  90 tablet  1  . LamoTRIgine (LAMICTAL ODT PO) Take 200 mg by mouth daily.       . Multiple Vitamin (MULITIVITAMIN WITH MINERALS) TABS Take 1 tablet by mouth daily.      . mycophenolate (CELLCEPT) 500 MG tablet Take 500 mg by mouth 2 (two) times daily.       . pantoprazole (PROTONIX) 40 MG tablet Take 1 tablet (40 mg total) by mouth daily.      . potassium chloride SA (K-DUR,KLOR-CON) 20 MEQ tablet Take 1 tablet (20 mEq total) by mouth 2 (two) times daily.  60 tablet  6  . predniSONE (DELTASONE) 5 MG tablet Take 5 mg by mouth daily.        Marland Kitchen pyridostigmine (MESTINON) 60 MG tablet TAKE 2 TABLETS IN THE MORNING AND 1 TABLET IN THE AFTERNOON  90 tablet  2  . tacrolimus (PROGRAF) 5 MG capsule Take 5 mg by mouth 2 (two) times daily.        . Tamsulosin HCl (FLOMAX) 0.4 MG CAPS Take 0.8 mg by mouth daily.       . traMADol (ULTRAM) 50 MG tablet       . Vilazodone HCl (VIIBRYD) 40 MG TABS Take 20 mg by mouth daily.       . vitamin C (ASCORBIC ACID) 500 MG tablet Take 1,000 mg by mouth daily.       . Fluticasone-Salmeterol (ADVAIR) 250-50 MCG/DOSE AEPB Inhale 1 puff into the lungs 2 (two) times daily.       Marland Kitchen  SPIRIVA HANDIHALER 18 MCG inhalation capsule INHALE CONTENTS OF 1 CAPSULE DAILY  30 capsule  4  . HYDROcodone-acetaminophen (NORCO/VICODIN) 5-325 MG per tablet Take 1 tablet by mouth as needed.      Marland Kitchen LORazepam (ATIVAN) 0.5 MG tablet       .  cefdinir (OMNICEF) 300 MG capsule Take 1 capsule (300 mg total) by mouth 2 (two) times daily.  10 capsule  0  . predniSONE (DELTASONE) 10 MG tablet Take 4 tabs po x 2 days, then 3 x 2 days, then 2 x 2 days, then 1 x 2 days then continue with 5mg  dose daily.  20 tablet  0   No facility-administered medications prior to visit.     Review of Systems  Constitutional:   No  weight loss, night sweats,  Fevers, chills,  +fatigue, or  lassitude.  HEENT:   No headaches,  Difficulty swallowing,  Tooth/dental problems, or  Sore throat,                No sneezing, itching, ear ache, nasal congestion, post nasal drip,   CV:  No chest pain,  Orthopnea, PND, swelling in lower extremities,  , dizziness,     GI  No heartburn, indigestion, abdominal pain, nausea, vomiting, diarrhea, change in bowel habits, loss of appetite, bloody stools.   Resp: +shortness of breath with exertion or at rest.   Notes  excess mucus, notes  productive cough,  No coughing up of blood.  Notes  change in color of mucus.  No wheezing.  No chest wall deformity  Skin: no rash or lesions.  GU: no dysuria, change in color of urine, no urgency or frequency.  No flank pain, no hematuria   MS:  +joint pain   +chronic  back pain.  Psych:  No change in mood or affect. No depression or anxiety.  No memory loss.          Objective:   Physical Exam  Filed Vitals:   06/18/12 1049  BP: 144/76  Pulse: 64  Temp: 98.8 F (37.1 C)  Height: 6\' 1"  (1.854 m)  SpO2: 96%    JYN:WGNFAOZ WM in no distress,  normal affect  ENT: No lesions,  mouth clear,  oropharynx clear, no postnasal drip  Neck: no JVD, no TMG, no carotid bruits  Lungs: No use of accessory muscles, no dullness to percussion diminshed BS in bases , exp wheezes , scattered rhonchi  Cardiovascular: RRR, heart sounds normal, no murmur or gallops, 2+ pretibial  peripheral edema  Abdomen: soft and NT, no HSM,  BS normal  Musculoskeletal: No deformities, no  cyanosis or clubbing  Neuro: alert, non focal  Skin: echymoses       Assessment & Plan:   COPD exacerbation Gold stage D. COPD with recurrent exacerbations now improved Chronic hypoxemic respiratory failure Plan Switch Advair and Spiriva over to [facility] one puff  daily (vilanterol/umeclidium, long acting LAMA/LABA)  rov one month      Updated Medication List Outpatient Encounter Prescriptions as of 06/18/2012  Medication Sig Dispense Refill  . albuterol (PROVENTIL HFA;VENTOLIN HFA) 108 (90 BASE) MCG/ACT inhaler Inhale 2 puffs into the lungs every 6 (six) hours as needed for wheezing or shortness of breath.  1 Inhaler  4  . aspirin 81 MG tablet Take 81 mg by mouth daily.      Marland Kitchen atorvastatin (LIPITOR) 40 MG tablet Take 40 mg by mouth daily.       . calcium carbonate (OS-CAL -  DOSED IN MG OF ELEMENTAL CALCIUM) 1250 MG tablet Take 2 tablets by mouth daily.      . carbidopa-levodopa (SINEMET CR) 50-200 MG per tablet TAKE ONE-HALF TABLET IN THE MORNING, ONE TABLET AT 11AM BEFORE LUNCHTIME AND ONE TABLET AT 5PM BEFORE DINNER.  75 tablet  7  . Cholecalciferol (VITAMIN D-3) 5000 UNITS TABS Take 2 tablets by mouth 3 (three) times a week.      . clindamycin (CLEOCIN T) 1 % lotion as needed. Apply as directed daily      . clobetasol (OLUX) 0.05 % topical foam Apply 1 application topically daily as needed. For head      . clopidogrel (PLAVIX) 75 MG tablet Take 1 tablet (75 mg total) by mouth daily.  30 tablet  5  . eszopiclone (LUNESTA) 2 MG TABS Take 2 mg by mouth at bedtime. Take immediately before bedtime      . ezetimibe (ZETIA) 10 MG tablet Take 10 mg by mouth daily.        . Ferrous Sulfate (IRON) 325 (65 FE) MG TABS Take 1 tablet by mouth daily.       . Fluticasone-Salmeterol (ADVAIR) 250-50 MCG/DOSE AEPB HOLD for now  60 each    . folic acid (FOLVITE) 1 MG tablet Take 1 mg by mouth daily.      . furosemide (LASIX) 40 MG tablet 2 tablets (total 80mg ) two times a day  120 tablet  6  .  GLUCAGEN HYPOKIT 1 MG SOLR Use as directed if needed      . insulin aspart (NOVOLOG) 100 UNIT/ML injection Inject into the skin. Administered via insulin pump      . isosorbide mononitrate (IMDUR) 60 MG 24 hr tablet Take 1 tablet (60 mg total) by mouth daily.  90 tablet  1  . LamoTRIgine (LAMICTAL ODT PO) Take 200 mg by mouth daily.       . Multiple Vitamin (MULITIVITAMIN WITH MINERALS) TABS Take 1 tablet by mouth daily.      . mycophenolate (CELLCEPT) 500 MG tablet Take 500 mg by mouth 2 (two) times daily.       . pantoprazole (PROTONIX) 40 MG tablet Take 1 tablet (40 mg total) by mouth daily.      . potassium chloride SA (K-DUR,KLOR-CON) 20 MEQ tablet Take 1 tablet (20 mEq total) by mouth 2 (two) times daily.  60 tablet  6  . predniSONE (DELTASONE) 5 MG tablet Take 5 mg by mouth daily.        Marland Kitchen pyridostigmine (MESTINON) 60 MG tablet TAKE 2 TABLETS IN THE MORNING AND 1 TABLET IN THE AFTERNOON  90 tablet  2  . tacrolimus (PROGRAF) 5 MG capsule Take 5 mg by mouth 2 (two) times daily.        . Tamsulosin HCl (FLOMAX) 0.4 MG CAPS Take 0.8 mg by mouth daily.       Marland Kitchen tiotropium (SPIRIVA HANDIHALER) 18 MCG inhalation capsule INHALE CONTENTS OF 1 CAPSULE DAILY  HOLD for Now  30 capsule  4  . traMADol (ULTRAM) 50 MG tablet       . Vilazodone HCl (VIIBRYD) 40 MG TABS Take 20 mg by mouth daily.       . vitamin C (ASCORBIC ACID) 500 MG tablet Take 1,000 mg by mouth daily.       . [DISCONTINUED] Fluticasone-Salmeterol (ADVAIR) 250-50 MCG/DOSE AEPB Inhale 1 puff into the lungs 2 (two) times daily.       . [DISCONTINUED] SPIRIVA HANDIHALER 18  MCG inhalation capsule INHALE CONTENTS OF 1 CAPSULE DAILY  30 capsule  4  . HYDROcodone-acetaminophen (NORCO/VICODIN) 5-325 MG per tablet Take 1 tablet by mouth as needed.      Marland Kitchen LORazepam (ATIVAN) 0.5 MG tablet       . Umeclidinium-Vilanterol (ANORO ELLIPTA) 62.5-25 MCG/INH AEPB Inhale 1 puff into the lungs daily.  7 each  3  . [DISCONTINUED] cefdinir (OMNICEF) 300 MG  capsule Take 1 capsule (300 mg total) by mouth 2 (two) times daily.  10 capsule  0  . [DISCONTINUED] predniSONE (DELTASONE) 10 MG tablet Take 4 tabs po x 2 days, then 3 x 2 days, then 2 x 2 days, then 1 x 2 days then continue with 5mg  dose daily.  20 tablet  0   No facility-administered encounter medications on file as of 06/18/2012.

## 2012-06-19 ENCOUNTER — Ambulatory Visit: Payer: Medicare Other | Admitting: Physical Therapy

## 2012-06-19 ENCOUNTER — Ambulatory Visit: Payer: Medicare Other | Admitting: Occupational Therapy

## 2012-06-21 ENCOUNTER — Ambulatory Visit: Payer: Medicare Other | Attending: Physical Medicine & Rehabilitation | Admitting: Physical Therapy

## 2012-06-21 DIAGNOSIS — R5381 Other malaise: Secondary | ICD-10-CM | POA: Insufficient documentation

## 2012-06-21 DIAGNOSIS — R269 Unspecified abnormalities of gait and mobility: Secondary | ICD-10-CM | POA: Insufficient documentation

## 2012-06-21 DIAGNOSIS — Z5189 Encounter for other specified aftercare: Secondary | ICD-10-CM | POA: Insufficient documentation

## 2012-06-24 ENCOUNTER — Telehealth: Payer: Self-pay | Admitting: Critical Care Medicine

## 2012-06-24 ENCOUNTER — Ambulatory Visit: Payer: Medicare Other | Admitting: Physical Therapy

## 2012-06-24 ENCOUNTER — Ambulatory Visit: Payer: Medicare Other | Admitting: Occupational Therapy

## 2012-06-24 NOTE — Telephone Encounter (Signed)
I spoke with spouse. She stated he can;t really tell if the anoro has really helped any with the breathing. He is no worse but no better. Tomorrow will be 1 week since he has been on this medication. She is fine with a call back tomorrow. Please advise Dr. Delford Field thanks

## 2012-06-24 NOTE — Telephone Encounter (Signed)
Remind the pt this was to replace BOTH ADvair and Spiriva. Is he ok with making this switch , we can start the prior auth.  If not I am ok with him going back to advair and spiriva and stopping ANORO,  I did not expect it to be superior to the other two , trying to simplify his program

## 2012-06-24 NOTE — Telephone Encounter (Signed)
atc na wcb 

## 2012-06-25 ENCOUNTER — Encounter: Payer: Self-pay | Admitting: Cardiology

## 2012-06-25 ENCOUNTER — Encounter: Payer: Self-pay | Admitting: Physical Medicine & Rehabilitation

## 2012-06-25 ENCOUNTER — Ambulatory Visit (INDEPENDENT_AMBULATORY_CARE_PROVIDER_SITE_OTHER): Payer: Medicare Other | Admitting: Cardiology

## 2012-06-25 ENCOUNTER — Ambulatory Visit (HOSPITAL_BASED_OUTPATIENT_CLINIC_OR_DEPARTMENT_OTHER): Payer: Medicare Other | Admitting: Physical Medicine & Rehabilitation

## 2012-06-25 ENCOUNTER — Encounter: Payer: Medicare Other | Attending: Physical Medicine & Rehabilitation

## 2012-06-25 VITALS — BP 121/59 | HR 60 | Resp 16 | Ht 71.0 in | Wt 220.0 lb

## 2012-06-25 VITALS — BP 128/62 | HR 64 | Ht 71.0 in | Wt 220.0 lb

## 2012-06-25 DIAGNOSIS — M216X9 Other acquired deformities of unspecified foot: Secondary | ICD-10-CM

## 2012-06-25 DIAGNOSIS — E1142 Type 2 diabetes mellitus with diabetic polyneuropathy: Secondary | ICD-10-CM

## 2012-06-25 DIAGNOSIS — R269 Unspecified abnormalities of gait and mobility: Secondary | ICD-10-CM | POA: Insufficient documentation

## 2012-06-25 DIAGNOSIS — M21372 Foot drop, left foot: Secondary | ICD-10-CM

## 2012-06-25 DIAGNOSIS — G20 Parkinson's disease: Secondary | ICD-10-CM | POA: Insufficient documentation

## 2012-06-25 DIAGNOSIS — G5730 Lesion of lateral popliteal nerve, unspecified lower limb: Secondary | ICD-10-CM | POA: Insufficient documentation

## 2012-06-25 DIAGNOSIS — I5032 Chronic diastolic (congestive) heart failure: Secondary | ICD-10-CM

## 2012-06-25 DIAGNOSIS — I951 Orthostatic hypotension: Secondary | ICD-10-CM

## 2012-06-25 DIAGNOSIS — R5381 Other malaise: Secondary | ICD-10-CM

## 2012-06-25 DIAGNOSIS — E1140 Type 2 diabetes mellitus with diabetic neuropathy, unspecified: Secondary | ICD-10-CM | POA: Insufficient documentation

## 2012-06-25 DIAGNOSIS — E7800 Pure hypercholesterolemia, unspecified: Secondary | ICD-10-CM

## 2012-06-25 DIAGNOSIS — E1149 Type 2 diabetes mellitus with other diabetic neurological complication: Secondary | ICD-10-CM

## 2012-06-25 DIAGNOSIS — I7389 Other specified peripheral vascular diseases: Secondary | ICD-10-CM

## 2012-06-25 DIAGNOSIS — G20A1 Parkinson's disease without dyskinesia, without mention of fluctuations: Secondary | ICD-10-CM | POA: Insufficient documentation

## 2012-06-25 DIAGNOSIS — I2510 Atherosclerotic heart disease of native coronary artery without angina pectoris: Secondary | ICD-10-CM

## 2012-06-25 DIAGNOSIS — N189 Chronic kidney disease, unspecified: Secondary | ICD-10-CM

## 2012-06-25 NOTE — Progress Notes (Signed)
Subjective:    Patient ID: John [name]., male    DOB: Jun 29, 1940, 72 y.o.   MRN: [id number]  HPI CIR rehab Attended by myself followed by [facility] ~5 months  Currently walks with walker at home. Back surgery in March 2013, post op vent failure.  Right AFO has been fitted toe up orthosis, and this is helping him walk much better L AFO To be fitted Started physical therapy. Neuro rehabilitation twice a week  OT twice a week  LBP occ  Pain Inventory Average Pain 1 Pain Right Now 0 My pain is aching  In the last 24 hours, has pain interfered with the following? General activity 1 Relation with others 3 Enjoyment of life 3 What TIME of day is your pain at its worst? evening Sleep (in general) Good  Pain is worse with: bending Pain improves with: rest and medication Relief from Meds: 3  Mobility use a walker how many minutes can you walk? 3 ability to climb steps?  no do you drive?  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  no  Physicians involved in your care Any changes since last visit?  no   Family History  Problem Relation Age of Onset  . Coronary artery disease Mother   . Valvular heart disease Father   . Hepatitis      sibling  . Coronary artery disease Sister   . Heart attack Sister   . Tuberculosis Father    History   Social History  . Marital Status: Married    Spouse Name: N/A    Number of Children: 3  . Years of Education: N/A   Occupational History  . retired Clinical research associate    Social History Main Topics  . Smoking status: Former Smoker -- 2.00 packs/day for 20 years    Types: Cigarettes    Quit date: 02/20/1978  . Smokeless tobacco: Never Used     Comment: smoked 1 ppd, stopped 1980  . Alcohol Use: No  . Drug Use: No  . Sexually Active: None   Other  Topics Concern  . None   Social History Narrative  . None   Past Surgical History  Procedure Laterality Date  . Coronary artery bypass graft  03/2009  . Cataract extraction    . Vitrectomy    . Tonsillectomy    . Kidney transplant  2002  . Hernia repair    . Prostate cancer rx 2006 brachytherapy    . Prior peritoneal catheter placement    . Lumbar laminectomy/decompression microdiscectomy  05/16/2011    Procedure: LUMBAR LAMINECTOMY/DECOMPRESSION MICRODISCECTOMY 1 LEVEL;  Surgeon: Barnett Abu, MD;  Location: [facility];  Service: Neurosurgery;  Laterality: Bilateral;  Bilateral Lumbar Five-Sacral One Laminectomy   Past Medical History  Diagnosis Date  . CAD (coronary artery disease)     a. myoview 1/11: lg inf fixed defect;   b. cath 1/11: 3v CAD,  c. s/p CABG 1/11 c/b acute renal failure and AFib  . Atrial fibrillation   . Chronic diastolic heart failure     a. echo 4/11: EF 45-50%, mod LVH, LAE, inf and septal HK  . First degree AV block   . Orthostatic hypotension     pyridostigmine  . History of renal transplant   . DM2 (diabetes mellitus, type 2)   .  Diabetic gastroparesis   . HTN (hypertension)   . HLD (hyperlipidemia)   . PAD (peripheral artery disease)     s/p R SFA-Pop atherectomy 11/11  . Prostate cancer   . ED (erectile dysfunction)   . DDD (degenerative disc disease), lumbar   . Tremor   . Obesity   . Low testosterone   . Chronic kidney disease     s/p renal transplant 2002  . Pleural effusion, right     loculated  . COPD (chronic obstructive pulmonary disease)   . Anxiety   . Diabetes mellitus     insulin pump   BP 121/59  Pulse 60  Resp 16  Ht 5\' 11"  (1.803 m)  Wt 220 lb (99.791 kg)  BMI 30.7 kg/m2  SpO2 96%    Review of Systems  Respiratory: Positive for shortness of breath.   Musculoskeletal: Positive for gait problem.  Neurological: Positive for tremors.  All other systems reviewed and are negative.       Objective:   Physical  Exam 4+/5 strength in bilateral deltoid, biceps, triceps, grip  4/5 in bilateral hip flexors and knee extensors 0 at the right ankle dorsiflexor 2 at the right ankle plantar flexor 3+ at the left ankle dorsiflexor and plantar flexor  Psychiatric: Thought content normal. His mood appears anxious. His speech is delayed. He is slowed.  Orientation to person, place, month, year  Ambulates with walker supervision assist there is evidence of left foot drop. Right foot clears well      Assessment & Plan:  1. Multifactor gait disorder.  A. Parkinson's disease which may be negatively affected by the Risperdal His does was reduced from 4.5 mg 3 mg. He plans to followup with psychiatry and hopefully reduce it again next time  B. Deconditioning after 5 month nursing home stay  C. Bilateral foot drop With poorly fitting Right AFO,Now has total up orthosis on the right as well as stated for left semirigid AFO.  D. Probable diabetic neuropathy Causing numbness in the hands and feet as well as bilateral foot drop   Continue outpatient PT and OT. He is scheduled for another 5 weeks. I'll see him back at the end of that time.

## 2012-06-25 NOTE — Patient Instructions (Addendum)
Decrease KCL (potassium) to 20 mEq daily.  Your physician recommends that you schedule a follow-up appointment in: 3 months with Dr Shirlee Latch.

## 2012-06-25 NOTE — Telephone Encounter (Signed)
Spoke with patient, he states for right now he will try the Anoro for "a little longer"  States he has enough to last him for now and will call back and let us know what he decides to do. Will forward to Dr. Delford Field as Lorain Childes

## 2012-06-26 ENCOUNTER — Ambulatory Visit (INDEPENDENT_AMBULATORY_CARE_PROVIDER_SITE_OTHER): Payer: Medicare Other | Admitting: Nurse Practitioner

## 2012-06-26 ENCOUNTER — Encounter: Payer: Self-pay | Admitting: Nurse Practitioner

## 2012-06-26 VITALS — BP 118/58 | HR 66

## 2012-06-26 DIAGNOSIS — Z9181 History of falling: Secondary | ICD-10-CM

## 2012-06-26 DIAGNOSIS — G20A1 Parkinson's disease without dyskinesia, without mention of fluctuations: Secondary | ICD-10-CM

## 2012-06-26 DIAGNOSIS — E1149 Type 2 diabetes mellitus with other diabetic neurological complication: Secondary | ICD-10-CM

## 2012-06-26 DIAGNOSIS — E1140 Type 2 diabetes mellitus with diabetic neuropathy, unspecified: Secondary | ICD-10-CM

## 2012-06-26 DIAGNOSIS — E1142 Type 2 diabetes mellitus with diabetic polyneuropathy: Secondary | ICD-10-CM

## 2012-06-26 DIAGNOSIS — G20 Parkinson's disease: Secondary | ICD-10-CM

## 2012-06-26 DIAGNOSIS — N189 Chronic kidney disease, unspecified: Secondary | ICD-10-CM

## 2012-06-26 NOTE — Patient Instructions (Addendum)
Continue occupational therapy and physical therapy Take Sinemet 30 minutes before meals Use walker for ambulation, and high risk for falls Followup in 6 months

## 2012-06-26 NOTE — Progress Notes (Addendum)
HPI: Patient returns for follow up after previous visits with Dr. [name].   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 has a. 3  year  history of Parkinson's disease. This manifested   with dysautonomia, complicated by  long standing diabetic neuropathy, nephropathy  and vasculopathy,  and  The patient is severely deconditioned.  He also has end-stage renal disease with transplant. Followed by Dr [name], nephrologist.   He is on continuous O2 for his chronic obstructive pulmonary disease. PD  manifested as gait instability and tremor, dysphonia , seborrhoic dermatitis - it  caused a very high fall risk, and the patient fell multiple times. . Since Parkinson's disease was diagnosed and treated with carbidopa and levodopa, the patient's tremors have decreased and  responded well.  His primary care physician is Dr. [name] , who initiated a long rehabilitation stay for the patient in 2013 after the patient became acutely ill with      ,  He is currently ( May 2014) receiving occupational therapy and physical therapy several days a week at neuro rehabilitation.  Dr. [name]  manages his pain. He has not fallen since last seen, he is seated in a wheelchair today. He requires assistance for dressing and bathing. He denies any swallowing difficulties. He has mild resting tremor today  ROS:  fatigue, swelling in legs,hearing loss, trouble swallowing, blurred vision, SOB, diarrhea, urination problems, easy bruising, easy bleeding, feeling cold, increast thrist, memory, headache, sleepiness, tremor, depression, anxiety, decreased energy,   Physical Exam General: well developed, well nourished, seated in wheelchair, continuous O2 at 3L/min. Head: head normocephalic and atraumatic. Oropharynx benign Neck: supple with right  carotid  bruit Cardiovascular: regular rate and rhythm,  Skin 1+ ankle edema Neurologic Exam Mental Status: Awake and  alert. MMSE  24/30. AFT 10. No previous MMSE recorded . Follows all commands, speech is delayed. Cranial Nerves: Fundoscopic exam reveals sharp disc margins. Pupils equal, briskly reactive to light. Extraocular movements full without nystagmus. Visual fields full to confrontation. Hearing intact and symmetric to finger snap. Facial sensation intact. Face, tongue, palate move normally and symmetrically.  Motor: Normal bulk and tone. Normal strength in all tested extremity muscles in the upper extremity.Bil foot drop with AFO right leg. Has been fitted for left. . 4/5 lower extremities.  Coordination: Rapid alternating movements normal in upper  extremities. Finger-to-nose performed slowly.  Unable to perform heel-to-shin. Gait and Station: Not ambulated, did not bring in walker   Reflexes: 1+ and symmetric. Toes downgoing.     ASSESSMENT: Parkinson's disease which worsened after his back surgery and respiratory complications. Depression and anxiety treated with Risperdal, sees psychiatrist Multifactorial gait disorder     PLAN: Given a packet of information of Parkinson's disease to include common questions and answers, resource guide, importance of physical therapy and exercise program, and fall prevention Continue occupational therapy and physical therapy Take Sinemet 30 minutes before meals Use walker for ambulation, at  high risk for falls MMSE 24/30. AFT 10. Will follow in 6 months with repeat testing. Followup in 6 months. Next visit with Dr. [name], GNP-BC APRN

## 2012-06-26 NOTE — Progress Notes (Signed)
Patient ID: John [name]., male   DOB: Jul 17, 1940, 72 y.o.   MRN: [id number] PCP: Dr. [name]  72 yo with history of renal transplant, DM, HTN, and CAD s/p CABG returns for followup.  Patient had a long and complicated hospital admission in 1/11.  Patient was admitted to [facility] at that time with a UTI and pulmonary edema.  Troponin was noted to be elevated and the patient had a nuclear scan with a large fixed inferior defect.  He ended up having a cardiac catheterization showing severe disease involving the LM, LAD, ramus, CFX and RCA.  He had CABG x 5.  Post-op course was complicated by pulmonary edema requiring intubation, renal failure requiring CVVH, and atrial fibrillation.  He was in the hospital about 7 weeks.  In 11/11, patient had right SFA and popliteal atherectomy that was complicated by a left groin pseudoaneurysm.   In the fall of 2012, Mr Bolds reported worsening orthostatic-type lightheadedness and worsening exertional dyspnea.  I decided at that point to take him for right and left heart cath in 10/12.  On Lasix, his right and left heart filling pressures were not significantly elevated.  His bypass grafts were patent but he had an ungrafted PDA with 95% stenosis (small vessel that would not be amenable to PCI).  As this was a possible source of ischemia and could potentially cause exertional dyspnea (his anginal equivalent was dyspnea in the past), I started him on Imdur.   I also took him off lisinopril due to worsening orthostatic symptoms.  He has been on pyridostigmine for orthostatic hypotension with supine hypertension.   In 3/13, Mr Fairley had back surgery.  Post-operative course was complicated by fluid retention and respiratory failure requiring intubation.  He developed PNA and delirium.  He had a prolonged hospitalization and rehabilitation.  He continues home PT.  He has been having difficulty walking due to poor balance and right foot drop.  He is wearing a brace which  has helped with the foot drop.  He walks with a walker.  Last echo in 3/14 showed normal EF, moderate LVH, and moderate aortic stenosis.    At last appointment, he continued to appear volume overloaded, so I increased Lasix to 80 mg bid.  He also had a COPD exacerbation since I last saw him.  More recently, he has been feeling better.  He is less short of breath with walking.  He can go about 200 feet before becoming dyspneic.  No chest pain. No orthopnea or PND.  Weight is stable.   Labs (9/11): LDL 71, HDL 45 Labs (12/11): K 4.9, creatinine 0.9, HCT 37.1  Labs (5/12): K 4.9, creatinine 0.9, BNP 157, HCT 36.4 Labs (5/12): LDL 42, HDL 44, K 3.7, creatinine 1.1, BNP 105 Labs (6/12): K 5, creatinine 1.1, TSH normal Labs (10/12): LDL 53, HDL 47, K 4.3, creatinine 7.82, BNP 197 Labs (12/12): K 4.1, creatinine 1.3 Labs (1/13): K 3.6, creatinine 0.96 Labs (5/13): K 4, creatinine 0.83 Labs (7/13): creatinine 0.7 Labs (12/13): K 3.7, creatinine 0.78 Labs (3/14): LDL 40, HDL 40, K 3.8, creatinine 0.8, BNP 513 Labs (4/14): K 4.9, creatinine 0.91  Allergies (verified):  No Known Drug Allergies  Past Medical History: 1. Diabetes mellitus 2. CKD s/p renal transplant 2002 3. HTN 4. PAD: atherectomy right SFA and right popliteal on 01/12/10.  Complicated by left groin pseudoaneurysm.  ABIs (3/14) with 0.85 R, 0.76 L (stable).  5. Lumbar disc disease with low back  pain 6. Diabetic gastroparesis 7. Prostate cancer s/p seed implantation in 1/06.  8. Erectile dysfunction 9. Tremor 10.  Obesity 11.  Diastolic CHF: Echo (6/12) showed EF 60%, moderate LV hypertrophy, very mild AS, normal RV size and systolic function.  Echo (3/13) with EF 55%, possible inferior hypokinesis, aortic sclerosis, mildly dilated RV with mildly decreased RV systolic function.  Echo (3/14): EF 55-60%, moderate LVH, inferior hypokinesis, mild MR, mild RV dilation, moderate aortic stenosis (mean gradient 26 mmHg/peak gradient 44  mmHg).  12.  CAD: Lexiscan myoview (2/11) with EF 44%,  fixed inferior defect.  Cath (2/11) with 60% distal left main, 90% ostial LAD involving D1, 95% ostial large ramus, probable significant ostial CFX stenosis, 90% PDA, 70% PLV.  Paitent had CABG with LIMA-LAD, SVG-D, sequential SVG-ramus and OM2, SVG-PLV.  Lexiscan myoview (5/12) with EF 59%, partially reversible inferior defect.  LHC/RHC (10/12): RA 6, PA 38/12, PCWP 9, CI 2.8; SVG-OM and ramus patent, LIMA-LAD patent, SVG-D1 patent with 60% stenosis at touchdown, SVG-PLV patent; there was a 95% PDA stenosis (seen on study prior to CABG) that was ungrafted.  The PDA was a small caliber vessel and not well-suited to PCI.  This could be a source of exertional symptoms.   13.  Atrial fibrillation post-op CABG 14.  Shy-Drager syndrome with orthostatic hypotension:  improvement with pyridostigmine.  15.  Low testosterone 16.  Loculated right>left pleural effusion: Right thoracentesis (6/12), cytology negative for malignancy.  17.  COPD: Followed by Dr Delford Field, now on home oxygen.    18.  Carotid dopplers (6/12): 0-39% bilateral ICA stenosis.  19.  Depression 20.  Lumbar disc disease 21.  Heart block: Patient has had profound 1st degree AV block on ECG.  Holter (11/12) showed NSR, 1st degree heart block, occasional short runs of type I 2nd degree heart block.  Holter (3/14) showed type I 2nd degree AV block.  22. Aortic stenosis: Moderate on 3/14 echo.   Family History: Noncontributory  Social History: Married, retired Clinical research associate, lives in [place].  Prior smoker.  ROS: All systems reviewed and negative except as per HPI.    Current Outpatient Prescriptions  Medication Sig Dispense Refill  . albuterol (PROVENTIL HFA;VENTOLIN HFA) 108 (90 BASE) MCG/ACT inhaler Inhale 2 puffs into the lungs every 6 (six) hours as needed for wheezing or shortness of breath.  1 Inhaler  4  . aspirin 81 MG tablet Take 81 mg by mouth daily.      Marland Kitchen atorvastatin  (LIPITOR) 40 MG tablet Take 40 mg by mouth daily.       . calcium carbonate (OS-CAL - DOSED IN MG OF ELEMENTAL CALCIUM) 1250 MG tablet Take 2 tablets by mouth daily.      . carbidopa-levodopa (SINEMET CR) 50-200 MG per tablet TAKE ONE-HALF TABLET IN THE MORNING, ONE TABLET AT 11AM BEFORE LUNCHTIME AND ONE TABLET AT 5PM BEFORE DINNER.  75 tablet  7  . Cholecalciferol (VITAMIN D-3) 5000 UNITS TABS Take 2 tablets by mouth 3 (three) times a week.      . clindamycin (CLEOCIN T) 1 % lotion as needed. Apply as directed daily      . clobetasol (OLUX) 0.05 % topical foam Apply 1 application topically daily as needed. For head      . clopidogrel (PLAVIX) 75 MG tablet Take 1 tablet (75 mg total) by mouth daily.  30 tablet  5  . eszopiclone (LUNESTA) 2 MG TABS Take 2 mg by mouth at bedtime. Take immediately before bedtime      .  ezetimibe (ZETIA) 10 MG tablet Take 10 mg by mouth daily.        . Ferrous Sulfate (IRON) 325 (65 FE) MG TABS Take 1 tablet by mouth daily.       . folic acid (FOLVITE) 1 MG tablet Take 1 mg by mouth daily.      . furosemide (LASIX) 40 MG tablet 2 tablets (total 80mg ) two times a day  120 tablet  6  . GLUCAGEN HYPOKIT 1 MG SOLR Use as directed if needed      . insulin aspart (NOVOLOG) 100 UNIT/ML injection Inject into the skin. Administered via insulin pump      . isosorbide mononitrate (IMDUR) 60 MG 24 hr tablet Take 1 tablet (60 mg total) by mouth daily.  90 tablet  1  . LamoTRIgine (LAMICTAL ODT PO) Take 200 mg by mouth daily.       Marland Kitchen LORazepam (ATIVAN) 0.5 MG tablet       . Multiple Vitamin (MULITIVITAMIN WITH MINERALS) TABS Take 1 tablet by mouth daily.      . mycophenolate (CELLCEPT) 500 MG tablet Take 500 mg by mouth 2 (two) times daily.       . pantoprazole (PROTONIX) 40 MG tablet Take 1 tablet (40 mg total) by mouth daily.      . potassium chloride SA (K-DUR,KLOR-CON) 20 MEQ tablet Take 1 tablet (20 mEq total) by mouth daily.      . predniSONE (DELTASONE) 5 MG tablet Take 5  mg by mouth daily.        Marland Kitchen pyridostigmine (MESTINON) 60 MG tablet TAKE 2 TABLETS IN THE MORNING AND 1 TABLET IN THE AFTERNOON  90 tablet  2  . tacrolimus (PROGRAF) 5 MG capsule Take 5 mg by mouth 2 (two) times daily.        . Tamsulosin HCl (FLOMAX) 0.4 MG CAPS Take 0.8 mg by mouth daily.       . traMADol (ULTRAM) 50 MG tablet       . Umeclidinium-Vilanterol (ANORO ELLIPTA) 62.5-25 MCG/INH AEPB Inhale 1 puff into the lungs daily.  7 each  3  . Vilazodone HCl (VIIBRYD) 40 MG TABS Take 20 mg by mouth daily.       . vitamin C (ASCORBIC ACID) 500 MG tablet Take 1,000 mg by mouth daily.       . Fluticasone-Salmeterol (ADVAIR) 250-50 MCG/DOSE AEPB HOLD for now  60 each    . tiotropium (SPIRIVA HANDIHALER) 18 MCG inhalation capsule INHALE CONTENTS OF 1 CAPSULE DAILY  HOLD for Now  30 capsule  4  . [DISCONTINUED] Calcium Carb-Cholecalciferol (CALCIUM 1000 + D PO) Take 1 tablet by mouth daily.       No current facility-administered medications for this visit.    BP 128/62  Pulse 64  Ht 5\' 11"  (1.803 m)  Wt 220 lb (99.791 kg)  BMI 30.7 kg/m2  SpO2 98% General: NAD Neck: JVP 8-9 cm, no thyromegaly or thyroid nodule.  Lungs: Distant breath sounds bilaterally. CV: Nondisplaced PMI.  Heart regular S1/S2, no S3/S4, 2/6 systolic murmur RUSB and apex.  1+ ankle edema bilaterally.  Right carotid bruit.  Abdomen: Soft, nontender, no hepatosplenomegaly, no distention.  Neurologic: Alert and oriented x 3.  Psych: Normal affect Extremities: No clubbing or cyanosis.   Assessment/Plan:  Chronic diastolic heart failure  Volume status is better though he is still mildly overloaded.  I am going to continue the current Lasix dosing for now, 80 mg bid.  He can decrease  KCl to 20 daily as K was 4.9 when last checked.   COPD  Severe.  He is on oxygen all the time now.  Follows with Dr. Delford Field.  CAD s/p CABG  Ungrafted PDA seen on last cath could be a source for ischemia. This a small caliber vessel and not  amenable to intervention. He is having no chest pain currently.  He will continue ASA 81, statin, Imdur, Plavix. No beta blocker or ACEI due to orthostatic hypotension.  HYPERLIPIDEMIA-MIXED  Good lipids in 3/14.  Continue statin.   Orthostatic hypotension Shy-Drager syndrome. Continue pyridostigmine. He is not having significant orthostatic symptoms at this time.  PVD WITH CLAUDICATION  ABIs stable when checked recently.  0.85 on right, 0.76 on left.  He is not having claudication and has no pedal ulcers.   Rhythm 48 hour holter showed frequent type I 2nd degree AV block but no higher grade block or pauses.  No lightheadedness or syncope.  Aortic stenosis Moderate on last echo.   [name] 06/26/2012 1:02 PM

## 2012-06-27 ENCOUNTER — Ambulatory Visit: Payer: Medicare Other | Admitting: Physical Therapy

## 2012-06-27 ENCOUNTER — Ambulatory Visit: Payer: Medicare Other | Admitting: Occupational Therapy

## 2012-06-27 NOTE — Progress Notes (Signed)
I, Melvyn Novas, MD,  have reviewed and attended the visit note and agree with the resulting recommendations.  Caron Tardif, MD

## 2012-07-01 ENCOUNTER — Ambulatory Visit: Payer: Medicare Other | Admitting: Physical Therapy

## 2012-07-01 ENCOUNTER — Ambulatory Visit: Payer: Medicare Other | Admitting: Occupational Therapy

## 2012-07-03 ENCOUNTER — Ambulatory Visit: Payer: Medicare Other | Admitting: Physical Therapy

## 2012-07-03 ENCOUNTER — Ambulatory Visit: Payer: Medicare Other | Admitting: Occupational Therapy

## 2012-07-09 ENCOUNTER — Ambulatory Visit: Payer: Medicare Other | Admitting: Physical Therapy

## 2012-07-09 ENCOUNTER — Ambulatory Visit: Payer: Medicare Other | Admitting: Occupational Therapy

## 2012-07-10 ENCOUNTER — Telehealth: Payer: Self-pay | Admitting: Critical Care Medicine

## 2012-07-10 MED ORDER — UMECLIDINIUM-VILANTEROL 62.5-25 MCG/INH IN AEPB
1.0000 | INHALATION_SPRAY | Freq: Every day | RESPIRATORY_TRACT | Status: DC
Start: 2012-07-10 — End: 2012-08-12

## 2012-07-10 NOTE — Telephone Encounter (Signed)
I spoke with spouse and isa ware sample left upfront for p/u. Nothing further was needed

## 2012-07-11 ENCOUNTER — Ambulatory Visit: Payer: Medicare Other | Admitting: Occupational Therapy

## 2012-07-11 ENCOUNTER — Ambulatory Visit: Payer: Medicare Other | Admitting: Physical Therapy

## 2012-07-12 ENCOUNTER — Ambulatory Visit: Payer: Medicare Other | Admitting: Physical Therapy

## 2012-07-17 ENCOUNTER — Ambulatory Visit: Payer: Medicare Other | Admitting: Physical Therapy

## 2012-07-17 ENCOUNTER — Ambulatory Visit: Payer: Medicare Other | Admitting: Occupational Therapy

## 2012-07-19 ENCOUNTER — Ambulatory Visit: Payer: Medicare Other | Admitting: Occupational Therapy

## 2012-07-19 ENCOUNTER — Ambulatory Visit: Payer: Medicare Other | Admitting: Physical Therapy

## 2012-07-22 ENCOUNTER — Ambulatory Visit (INDEPENDENT_AMBULATORY_CARE_PROVIDER_SITE_OTHER): Payer: Medicare Other | Admitting: Critical Care Medicine

## 2012-07-22 ENCOUNTER — Ambulatory Visit: Payer: Medicare Other | Attending: Physical Medicine & Rehabilitation | Admitting: Physical Therapy

## 2012-07-22 ENCOUNTER — Ambulatory Visit: Payer: Medicare Other | Admitting: Occupational Therapy

## 2012-07-22 ENCOUNTER — Encounter: Payer: Self-pay | Admitting: Critical Care Medicine

## 2012-07-22 VITALS — BP 126/84 | HR 70 | Temp 97.9°F | Ht 73.0 in | Wt 220.0 lb

## 2012-07-22 DIAGNOSIS — J449 Chronic obstructive pulmonary disease, unspecified: Secondary | ICD-10-CM

## 2012-07-22 DIAGNOSIS — Z5189 Encounter for other specified aftercare: Secondary | ICD-10-CM | POA: Insufficient documentation

## 2012-07-22 DIAGNOSIS — R269 Unspecified abnormalities of gait and mobility: Secondary | ICD-10-CM | POA: Insufficient documentation

## 2012-07-22 DIAGNOSIS — R5381 Other malaise: Secondary | ICD-10-CM | POA: Insufficient documentation

## 2012-07-22 DIAGNOSIS — Z23 Encounter for immunization: Secondary | ICD-10-CM

## 2012-07-22 NOTE — Progress Notes (Signed)
Subjective:    Patient ID: John [name]., male    DOB: [birth year], 72 y.o.   MRN: [id number]  HPI 72 y.o.  with history of renal transplant on chronic immunosuppressants, DM-insulin dependent, HTN, , Chronic back pain and CAD s/p CABG . Patient had a long and complicated hospital admission in 1/11 s/p CABG x 5.   Post-op course was complicated by pulmonary edema requiring intubation, renal failure requiring CVVH, and atrial fibrillation. He was in the hospital about 7 weeks. In 11/11, patient had right SFA and popliteal atherectomy that was complicated by a groin pseudoaneurysm. Hx of orthostatic hypotension, ShyDrager, and severe tremor followed by Dohmeier   07/22/2012 Dyspnea is the same.  Pt had diuretics increased and less fluid.  Not as much edema in LE No real mucus now.   Pt tolerating ANORO well and is off spirva and advair Pt denies any significant sore throat, nasal congestion or excess secretions, fever, chills, sweats, unintended weight loss, pleurtic or exertional chest pain, orthopnea PND, or leg swelling Pt denies any increase in rescue therapy over baseline, denies waking up needing it or having any early am or nocturnal exacerbations of coughing/wheezing/or dyspnea. Pt also denies any obvious fluctuation in symptoms with  weather or environmental change or other alleviating or aggravating factors      Past Medical History  Diagnosis Date  . CAD (coronary artery disease)     a. myoview 1/11: lg inf fixed defect;   b. cath 1/11: 3v CAD,  c. s/p CABG 1/11 c/b acute renal failure and AFib  . Atrial fibrillation   . Chronic diastolic heart failure     a. echo 4/11: EF 45-50%, mod LVH, LAE, inf and septal HK  . First degree AV block   . Orthostatic hypotension     pyridostigmine  . History of renal transplant   . DM2 (diabetes mellitus, type 2)   . Diabetic gastroparesis   . HTN (hypertension)   . HLD (hyperlipidemia)   . PAD (peripheral artery disease)     s/p R  SFA-Pop atherectomy 11/11  . Prostate cancer   . ED (erectile dysfunction)   . DDD (degenerative disc disease), lumbar   . Tremor   . Obesity   . Low testosterone   . Chronic kidney disease     s/p renal transplant 2002  . Pleural effusion, right     loculated  . COPD (chronic obstructive pulmonary disease)   . Anxiety   . Diabetes mellitus     insulin pump  . Movement disorder      Family History  Problem Relation Age of Onset  . Coronary artery disease Mother   . Valvular heart disease Father   . Hepatitis      sibling  . Coronary artery disease Sister   . Heart attack Sister   . Tuberculosis Father      History   Social History  . Marital Status: Married    Spouse Name: N/A    Number of Children: 3  . Years of Education: N/A   Occupational History  . retired Clinical research associate    Social History Main Topics  . Smoking status: Former Smoker -- 2.00 packs/day for 20 years    Types: Cigarettes    Quit date: 02/20/1978  . Smokeless tobacco: Never Used     Comment: smoked 1 ppd, stopped 1980  . Alcohol Use: No  . Drug Use: No  . Sexually Active: Not on file  Other Topics Concern  . Not on file   Social History Narrative  . No narrative on file     No Known Allergies   Outpatient Prescriptions Prior to Visit  Medication Sig Dispense Refill  . albuterol (PROVENTIL HFA;VENTOLIN HFA) 108 (90 BASE) MCG/ACT inhaler Inhale 2 puffs into the lungs every 6 (six) hours as needed for wheezing or shortness of breath.  1 Inhaler  4  . aspirin 81 MG tablet Take 81 mg by mouth daily.      Marland Kitchen atorvastatin (LIPITOR) 40 MG tablet Take 40 mg by mouth daily.       . calcium carbonate (OS-CAL - DOSED IN MG OF ELEMENTAL CALCIUM) 1250 MG tablet Take 2 tablets by mouth daily.      . carbidopa-levodopa (SINEMET CR) 50-200 MG per tablet TAKE ONE-HALF TABLET IN THE MORNING, ONE TABLET AT 11AM BEFORE LUNCHTIME AND ONE TABLET AT 5PM BEFORE DINNER.  75 tablet  7  . Cholecalciferol (VITAMIN D-3)  5000 UNITS TABS Take 2 tablets by mouth 3 (three) times a week.      . clindamycin (CLEOCIN T) 1 % lotion as needed. Apply as directed daily      . clobetasol (OLUX) 0.05 % topical foam Apply 1 application topically daily as needed. For head      . clopidogrel (PLAVIX) 75 MG tablet Take 1 tablet (75 mg total) by mouth daily.  30 tablet  5  . eszopiclone (LUNESTA) 2 MG TABS Take 2 mg by mouth at bedtime. Take immediately before bedtime      . ezetimibe (ZETIA) 10 MG tablet Take 10 mg by mouth daily.        . Ferrous Sulfate (IRON) 325 (65 FE) MG TABS Take 1 tablet by mouth daily.       . folic acid (FOLVITE) 1 MG tablet Take 1 mg by mouth daily.      . furosemide (LASIX) 40 MG tablet 2 tablets (total 80mg ) two times a day  120 tablet  6  . GLUCAGEN HYPOKIT 1 MG SOLR Use as directed if needed      . insulin aspart (NOVOLOG) 100 UNIT/ML injection Inject into the skin. Administered via insulin pump      . isosorbide mononitrate (IMDUR) 60 MG 24 hr tablet Take 1 tablet (60 mg total) by mouth daily.  90 tablet  1  . LamoTRIgine (LAMICTAL ODT PO) Take 200 mg by mouth daily.       Marland Kitchen LORazepam (ATIVAN) 0.5 MG tablet 0.5 mg as needed.       . Multiple Vitamin (MULITIVITAMIN WITH MINERALS) TABS Take 1 tablet by mouth daily.      . mycophenolate (CELLCEPT) 500 MG tablet Take 500 mg by mouth 2 (two) times daily.       . pantoprazole (PROTONIX) 40 MG tablet Take 1 tablet (40 mg total) by mouth daily.      . potassium chloride SA (K-DUR,KLOR-CON) 20 MEQ tablet Take 1 tablet (20 mEq total) by mouth daily.      . predniSONE (DELTASONE) 5 MG tablet Take 5 mg by mouth daily.        Marland Kitchen pyridostigmine (MESTINON) 60 MG tablet TAKE 2 TABLETS IN THE MORNING AND 1 TABLET IN THE AFTERNOON  90 tablet  2  . tacrolimus (PROGRAF) 5 MG capsule Take 5 mg by mouth 2 (two) times daily.        . Tamsulosin HCl (FLOMAX) 0.4 MG CAPS Take 0.8 mg by mouth daily.       Marland Kitchen  traMADol (ULTRAM) 50 MG tablet Take 50 mg by mouth as needed.        Marland Kitchen Umeclidinium-Vilanterol (ANORO ELLIPTA) 62.5-25 MCG/INH AEPB Inhale 1 puff into the lungs daily.  7 each  2  . Vilazodone HCl (VIIBRYD) 40 MG TABS Take 20 mg by mouth daily.       . vitamin C (ASCORBIC ACID) 500 MG tablet Take 1,000 mg by mouth daily.       . Fluticasone-Salmeterol (ADVAIR) 250-50 MCG/DOSE AEPB HOLD for now  60 each    . tiotropium (SPIRIVA HANDIHALER) 18 MCG inhalation capsule INHALE CONTENTS OF 1 CAPSULE DAILY  HOLD for Now  30 capsule  4   No facility-administered medications prior to visit.     Review of Systems  Constitutional:   No  weight loss, night sweats,  Fevers, chills,  +fatigue, or  lassitude.  HEENT:   No headaches,  Difficulty swallowing,  Tooth/dental problems, or  Sore throat,                No sneezing, itching, ear ache, nasal congestion, post nasal drip,   CV:  No chest pain,  Orthopnea, PND, swelling in lower extremities,  , dizziness,     GI  No heartburn, indigestion, abdominal pain, nausea, vomiting, diarrhea, change in bowel habits, loss of appetite, bloody stools.   Resp: +shortness of breath with exertion or at rest.   Notes  excess mucus, notes  productive cough,  No coughing up of blood.  Notes  change in color of mucus.  No wheezing.  No chest wall deformity  Skin: no rash or lesions.  GU: no dysuria, change in color of urine, no urgency or frequency.  No flank pain, no hematuria   MS:  +joint pain   +chronic  back pain.  Psych:  No change in mood or affect. No depression or anxiety.  No memory loss.          Objective:   Physical Exam  Filed Vitals:   07/22/12 1520  BP: 126/84  Pulse: 70  Temp: 97.9 F (36.6 C)  TempSrc: Oral  Height: 6\' 1"  (1.854 m)  Weight: 220 lb (99.791 kg)  SpO2: 96%    [id number] WM in no distress,  normal affect  ENT: No lesions,  mouth clear,  oropharynx clear, no postnasal drip  Neck: no JVD, no TMG, no carotid bruits  Lungs: No use of accessory muscles, no dullness to  percussion diminshed BS in bases , no wheeze or rhonchi   Cardiovascular: RRR, heart sounds normal, no murmur or gallops, 2+ pretibial  peripheral edema  Abdomen: soft and NT, no HSM,  BS normal  Musculoskeletal: No deformities, no cyanosis or clubbing  Neuro: alert, non focal  Skin: echymoses       Assessment & Plan:   COPD (chronic obstructive pulmonary disease) gold stage D. Gold stage D. COPD with recent exacerbation one month ago now resolved Plan Maintain Anoro daily that has replaced Advair and Spiriva Maintain oxygen therapy       Updated Medication List Outpatient Encounter Prescriptions as of 07/22/2012  Medication Sig Dispense Refill  . albuterol (PROVENTIL HFA;VENTOLIN HFA) 108 (90 BASE) MCG/ACT inhaler Inhale 2 puffs into the lungs every 6 (six) hours as needed for wheezing or shortness of breath.  1 Inhaler  4  . aspirin 81 MG tablet Take 81 mg by mouth daily.      Marland Kitchen atorvastatin (LIPITOR) 40 MG tablet Take 40 mg by mouth  daily.       . calcium carbonate (OS-CAL - DOSED IN MG OF ELEMENTAL CALCIUM) 1250 MG tablet Take 2 tablets by mouth daily.      . carbidopa-levodopa (SINEMET CR) 50-200 MG per tablet TAKE ONE-HALF TABLET IN THE MORNING, ONE TABLET AT 11AM BEFORE LUNCHTIME AND ONE TABLET AT 5PM BEFORE DINNER.  75 tablet  7  . Cholecalciferol (VITAMIN D-3) 5000 UNITS TABS Take 2 tablets by mouth 3 (three) times a week.      . clindamycin (CLEOCIN T) 1 % lotion as needed. Apply as directed daily      . clobetasol (OLUX) 0.05 % topical foam Apply 1 application topically daily as needed. For head      . clopidogrel (PLAVIX) 75 MG tablet Take 1 tablet (75 mg total) by mouth daily.  30 tablet  5  . eszopiclone (LUNESTA) 2 MG TABS Take 2 mg by mouth at bedtime. Take immediately before bedtime      . ezetimibe (ZETIA) 10 MG tablet Take 10 mg by mouth daily.        . Ferrous Sulfate (IRON) 325 (65 FE) MG TABS Take 1 tablet by mouth daily.       . folic acid (FOLVITE) 1  MG tablet Take 1 mg by mouth daily.      . furosemide (LASIX) 40 MG tablet 2 tablets (total 80mg ) two times a day  120 tablet  6  . GLUCAGEN HYPOKIT 1 MG SOLR Use as directed if needed      . insulin aspart (NOVOLOG) 100 UNIT/ML injection Inject into the skin. Administered via insulin pump      . isosorbide mononitrate (IMDUR) 60 MG 24 hr tablet Take 1 tablet (60 mg total) by mouth daily.  90 tablet  1  . LamoTRIgine (LAMICTAL ODT PO) Take 200 mg by mouth daily.       Marland Kitchen LORazepam (ATIVAN) 0.5 MG tablet 0.5 mg as needed.       . Multiple Vitamin (MULITIVITAMIN WITH MINERALS) TABS Take 1 tablet by mouth daily.      . mycophenolate (CELLCEPT) 500 MG tablet Take 500 mg by mouth 2 (two) times daily.       . pantoprazole (PROTONIX) 40 MG tablet Take 1 tablet (40 mg total) by mouth daily.      . potassium chloride SA (K-DUR,KLOR-CON) 20 MEQ tablet Take 1 tablet (20 mEq total) by mouth daily.      . predniSONE (DELTASONE) 5 MG tablet Take 5 mg by mouth daily.        Marland Kitchen pyridostigmine (MESTINON) 60 MG tablet TAKE 2 TABLETS IN THE MORNING AND 1 TABLET IN THE AFTERNOON  90 tablet  2  . tacrolimus (PROGRAF) 5 MG capsule Take 5 mg by mouth 2 (two) times daily.        . Tamsulosin HCl (FLOMAX) 0.4 MG CAPS Take 0.8 mg by mouth daily.       . traMADol (ULTRAM) 50 MG tablet Take 50 mg by mouth as needed.       Marland Kitchen Umeclidinium-Vilanterol (ANORO ELLIPTA) 62.5-25 MCG/INH AEPB Inhale 1 puff into the lungs daily.  7 each  2  . Vilazodone HCl (VIIBRYD) 40 MG TABS Take 20 mg by mouth daily.       . vitamin C (ASCORBIC ACID) 500 MG tablet Take 1,000 mg by mouth daily.       . [DISCONTINUED] Fluticasone-Salmeterol (ADVAIR) 250-50 MCG/DOSE AEPB HOLD for now  60 each    . [  DISCONTINUED] tiotropium (SPIRIVA HANDIHALER) 18 MCG inhalation capsule INHALE CONTENTS OF 1 CAPSULE DAILY  HOLD for Now  30 capsule  4   No facility-administered encounter medications on file as of 07/22/2012.

## 2012-07-22 NOTE — Patient Instructions (Addendum)
A Tetanus vaccine will be given Stay on Anoro Return 4 months

## 2012-07-22 NOTE — Assessment & Plan Note (Signed)
Gold stage D. COPD with recent exacerbation one month ago now resolved Plan Maintain Anoro daily that has replaced Advair and Spiriva Maintain oxygen therapy

## 2012-07-24 ENCOUNTER — Ambulatory Visit: Payer: Medicare Other | Admitting: Physical Therapy

## 2012-07-24 ENCOUNTER — Ambulatory Visit: Payer: Medicare Other | Admitting: Occupational Therapy

## 2012-07-29 ENCOUNTER — Other Ambulatory Visit: Payer: Self-pay | Admitting: Cardiology

## 2012-07-29 ENCOUNTER — Ambulatory Visit: Payer: Medicare Other | Admitting: Occupational Therapy

## 2012-07-29 ENCOUNTER — Ambulatory Visit: Payer: Medicare Other | Admitting: Physical Therapy

## 2012-07-30 ENCOUNTER — Encounter: Payer: Medicare Other | Attending: Physical Medicine & Rehabilitation

## 2012-07-30 ENCOUNTER — Ambulatory Visit (HOSPITAL_BASED_OUTPATIENT_CLINIC_OR_DEPARTMENT_OTHER): Payer: Medicare Other | Admitting: Physical Medicine & Rehabilitation

## 2012-07-30 ENCOUNTER — Encounter: Payer: Self-pay | Admitting: Physical Medicine & Rehabilitation

## 2012-07-30 VITALS — BP 149/57 | HR 68 | Resp 14 | Ht 73.0 in | Wt 220.0 lb

## 2012-07-30 DIAGNOSIS — G20 Parkinson's disease: Secondary | ICD-10-CM

## 2012-07-30 DIAGNOSIS — G20A1 Parkinson's disease without dyskinesia, without mention of fluctuations: Secondary | ICD-10-CM | POA: Insufficient documentation

## 2012-07-30 DIAGNOSIS — E1149 Type 2 diabetes mellitus with other diabetic neurological complication: Secondary | ICD-10-CM

## 2012-07-30 DIAGNOSIS — G5730 Lesion of lateral popliteal nerve, unspecified lower limb: Secondary | ICD-10-CM | POA: Insufficient documentation

## 2012-07-30 DIAGNOSIS — M216X9 Other acquired deformities of unspecified foot: Secondary | ICD-10-CM | POA: Insufficient documentation

## 2012-07-30 DIAGNOSIS — E1142 Type 2 diabetes mellitus with diabetic polyneuropathy: Secondary | ICD-10-CM

## 2012-07-30 DIAGNOSIS — R5381 Other malaise: Secondary | ICD-10-CM | POA: Insufficient documentation

## 2012-07-30 DIAGNOSIS — R269 Unspecified abnormalities of gait and mobility: Secondary | ICD-10-CM | POA: Insufficient documentation

## 2012-07-30 NOTE — Patient Instructions (Signed)
See you after you complete PT/OT

## 2012-07-30 NOTE — Progress Notes (Signed)
Subjective:    Patient ID: John Novel., male    DOB: 02/02/1941, 72 y.o.   MRN: [id number]  HPI CIR rehab Attended by myself followed [facility] ~[facility] months  Currently walks with walker at home. Back surgery in March 2013, post op vent failure.  Right AFO has been fitted toe up orthosis, and this is helping him walk much better  L AFO foot up type fitted and doing well Started physical therapy. Neuro rehabilitation twice a week  OT twice a week  Pain Inventory Average Pain 0 Pain Right Now 0 My pain is aching  In the last 24 hours, has pain interfered with the following? General activity 1 Relation with others 3 Enjoyment of life 3 What TIME of day is your pain at its worst? evening Sleep (in general) Good  Pain is worse with: bending Pain improves with: rest and medication Relief from Meds: 3  Mobility walk with assistance use a walker  Function retired I need assistance with the following:  bathing  Neuro/Psych tremor trouble walking depression anxiety  Prior Studies Any changes since last visit?  no  Physicians involved in your care Any changes since last visit?  no   Family History  Problem Relation Age of Onset  . Coronary artery disease Mother   . Valvular heart disease Father   . Hepatitis      sibling  . Coronary artery disease Sister   . Heart attack Sister   . Tuberculosis Father    History   Social History  . Marital Status: Married    Spouse Name: N/A    Number of Children: 3  . Years of Education: N/A   Occupational History  . retired Clinical research associate    Social History Main Topics  . Smoking status: Former Smoker -- 2.00 packs/day for 20 years    Types: Cigarettes    Quit date: 02/20/1978  . Smokeless tobacco: Never Used     Comment: smoked 1 ppd, stopped 1980  . Alcohol Use: No  . Drug Use: No  . Sexually Active: None   Other Topics Concern  . None   Social History Narrative  . None   Past Surgical History   Procedure Laterality Date  . Coronary artery bypass graft  03/2009  . Cataract extraction    . Vitrectomy    . Tonsillectomy    . Kidney transplant  2002  . Hernia repair    . Prostate cancer rx 2006 brachytherapy    . Prior peritoneal catheter placement    . Lumbar laminectomy/decompression microdiscectomy  05/16/2011    Procedure: LUMBAR LAMINECTOMY/DECOMPRESSION MICRODISCECTOMY 1 LEVEL;  Surgeon: Barnett Abu, MD;  Location: [facility];  Service: Neurosurgery;  Laterality: Bilateral;  Bilateral Lumbar Five-Sacral One Laminectomy   Past Medical History  Diagnosis Date  . CAD (coronary artery disease)     a. myoview 1/11: lg inf fixed defect;   b. cath 1/11: 3v CAD,  c. s/p CABG 1/11 c/b acute renal failure and AFib  . Atrial fibrillation   . Chronic diastolic heart failure     a. echo 4/11: EF 45-50%, mod LVH, LAE, inf and septal HK  . First degree AV block   . Orthostatic hypotension     pyridostigmine  . History of renal transplant   . DM2 (diabetes mellitus, type 2)   . Diabetic gastroparesis   . HTN (hypertension)   . HLD (hyperlipidemia)   . PAD (peripheral artery disease)  s/p R SFA-Pop atherectomy 11/11  . Prostate cancer   . ED (erectile dysfunction)   . DDD (degenerative disc disease), lumbar   . Tremor   . Obesity   . Low testosterone   . Chronic kidney disease     s/p renal transplant 2002  . Pleural effusion, right     loculated  . COPD (chronic obstructive pulmonary disease)   . Anxiety   . Diabetes mellitus     insulin pump  . Movement disorder    BP 149/57  Pulse 68  Resp 14  Ht 6\' 1"  (1.854 m)  Wt 220 lb (99.791 kg)  BMI 29.03 kg/m2  SpO2 93%    Review of Systems  Respiratory: Positive for shortness of breath.   Musculoskeletal: Positive for gait problem.  Neurological: Positive for tremors.  Hematological: Bruises/bleeds easily.  Psychiatric/Behavioral: Positive for dysphoric mood. The patient is nervous/anxious.   All other systems  reviewed and are negative.       Objective:   Physical Exam 4+/5 strength in bilateral deltoid, biceps, triceps, grip  4/5 in bilateral hip flexors and knee extensors 0 at the right ankle dorsiflexor 2 at the right ankle plantar flexor 3+ at the left ankle dorsiflexor and plantar flexor  Psychiatric: Thought content normal. His mood appears anxious. His speech is delayed. He is slowed.Affect brighter  Orientation to person, place, month, year        Assessment & Plan:  1. Multifactor gait disorder.  A. Parkinson's disease which may be negatively affected by the Risperdal His does was reduced from 4.5 mg 3 mg. He plans to followup with psychiatry and hopefully reduce it again next time  B. Deconditioning after 5 month nursing home stay  C. Bilateral foot drop With poorly fitting Right AFO,Now has toe up orthosis on the right as well as  left foot up orthosis  D. Probable diabetic neuropathy Causing numbness in the hands and feet as well as bilateral foot drop  Continue outpatient PT and OT. He is scheduled for another 5 weeks. I'll see him back at the end of that time.

## 2012-08-01 ENCOUNTER — Ambulatory Visit: Payer: Medicare Other | Admitting: Occupational Therapy

## 2012-08-01 ENCOUNTER — Ambulatory Visit: Payer: Medicare Other | Admitting: Physical Therapy

## 2012-08-12 ENCOUNTER — Ambulatory Visit: Payer: Medicare Other | Admitting: Occupational Therapy

## 2012-08-12 ENCOUNTER — Telehealth: Payer: Self-pay | Admitting: Critical Care Medicine

## 2012-08-12 ENCOUNTER — Ambulatory Visit: Payer: Medicare Other | Admitting: Physical Therapy

## 2012-08-12 MED ORDER — UMECLIDINIUM-VILANTEROL 62.5-25 MCG/INH IN AEPB: 1.0000 | INHALATION_SPRAY | Freq: Every day | RESPIRATORY_TRACT | Status: DC

## 2012-08-12 NOTE — Telephone Encounter (Signed)
Rx was sent to pharm  LMOM for the pt to be made aware 

## 2012-08-13 ENCOUNTER — Telehealth: Payer: Self-pay | Admitting: Critical Care Medicine

## 2012-08-13 MED ORDER — UMECLIDINIUM-VILANTEROL 62.5-25 MCG/INH IN AEPB: 1.0000 | INHALATION_SPRAY | Freq: Every day | RESPIRATORY_TRACT | Status: DC

## 2012-08-13 NOTE — Telephone Encounter (Signed)
I spoke with spouse. i left a free 30 day trial coupon upfront and sample. Nothing further was needed

## 2012-08-13 NOTE — Telephone Encounter (Signed)
Spoke with pt's wife and verified that rx was sent to pharmacy

## 2012-08-14 ENCOUNTER — Ambulatory Visit: Payer: Medicare Other | Admitting: Occupational Therapy

## 2012-08-14 ENCOUNTER — Ambulatory Visit: Payer: Medicare Other | Admitting: Physical Therapy

## 2012-08-20 ENCOUNTER — Ambulatory Visit: Payer: Medicare Other | Admitting: Physical Therapy

## 2012-08-20 ENCOUNTER — Ambulatory Visit: Payer: Medicare Other | Attending: Physical Medicine & Rehabilitation | Admitting: Occupational Therapy

## 2012-08-20 DIAGNOSIS — R269 Unspecified abnormalities of gait and mobility: Secondary | ICD-10-CM | POA: Insufficient documentation

## 2012-08-20 DIAGNOSIS — Z5189 Encounter for other specified aftercare: Secondary | ICD-10-CM | POA: Insufficient documentation

## 2012-08-20 DIAGNOSIS — R5381 Other malaise: Secondary | ICD-10-CM | POA: Insufficient documentation

## 2012-08-22 ENCOUNTER — Ambulatory Visit: Payer: Medicare Other | Admitting: Occupational Therapy

## 2012-08-22 ENCOUNTER — Ambulatory Visit: Payer: Medicare Other | Admitting: Physical Therapy

## 2012-08-26 ENCOUNTER — Ambulatory Visit: Payer: Medicare Other | Admitting: Physical Therapy

## 2012-08-28 ENCOUNTER — Ambulatory Visit: Payer: Medicare Other | Admitting: Physical Therapy

## 2012-09-09 ENCOUNTER — Other Ambulatory Visit: Payer: Self-pay | Admitting: Cardiology

## 2012-09-10 ENCOUNTER — Encounter: Payer: Self-pay | Admitting: Physical Medicine & Rehabilitation

## 2012-09-10 ENCOUNTER — Encounter: Payer: Medicare Other | Attending: Physical Medicine & Rehabilitation

## 2012-09-10 ENCOUNTER — Ambulatory Visit (HOSPITAL_BASED_OUTPATIENT_CLINIC_OR_DEPARTMENT_OTHER): Payer: Medicare Other | Admitting: Physical Medicine & Rehabilitation

## 2012-09-10 VITALS — BP 125/62 | HR 68 | Resp 17 | Ht 73.5 in | Wt 215.0 lb

## 2012-09-10 DIAGNOSIS — M216X9 Other acquired deformities of unspecified foot: Secondary | ICD-10-CM | POA: Insufficient documentation

## 2012-09-10 DIAGNOSIS — E1142 Type 2 diabetes mellitus with diabetic polyneuropathy: Secondary | ICD-10-CM

## 2012-09-10 DIAGNOSIS — M21372 Foot drop, left foot: Secondary | ICD-10-CM

## 2012-09-10 DIAGNOSIS — E1149 Type 2 diabetes mellitus with other diabetic neurological complication: Secondary | ICD-10-CM

## 2012-09-10 DIAGNOSIS — R269 Unspecified abnormalities of gait and mobility: Secondary | ICD-10-CM | POA: Insufficient documentation

## 2012-09-10 DIAGNOSIS — G20 Parkinson's disease: Secondary | ICD-10-CM | POA: Insufficient documentation

## 2012-09-10 DIAGNOSIS — R5381 Other malaise: Secondary | ICD-10-CM | POA: Insufficient documentation

## 2012-09-10 DIAGNOSIS — G20A1 Parkinson's disease without dyskinesia, without mention of fluctuations: Secondary | ICD-10-CM | POA: Insufficient documentation

## 2012-09-10 DIAGNOSIS — G5730 Lesion of lateral popliteal nerve, unspecified lower limb: Secondary | ICD-10-CM | POA: Insufficient documentation

## 2012-09-10 NOTE — Patient Instructions (Signed)
sennokot S or generic equivalent which is senna with docusate sodium 2 tablets at bedtime  If not helpful try two tablets twice a day

## 2012-09-10 NOTE — Progress Notes (Signed)
Subjective:    Patient ID: John Novel., male    DOB: [birth year], 72 y.o.   MRN: [id number]  HPI [facility] rehab Attended by myself [facility]  Currently walks with walker at home. Back surgery in March 2013, post op vent failure.  Right AFO has been fitted toe up orthosis, and this is helping him walk much better  L AFO foot up type fitted and doing well  Completed outpt therapy  Pain Inventory Average Pain na Pain Right Now na My pain is na  In the last 24 hours, has pain interfered with the following? General activity na Relation with others na Enjoyment of life na What TIME of day is your pain at its worst? na Sleep (in general) NA  Pain is worse with: na Pain improves with: na Relief from Meds: na  Mobility use a walker ability to climb steps?  no do you drive?  no use a wheelchair Do you have any goals in this area?  yes   Function retired I need assistance with the following:  bathing  Neuro/Psych tremor trouble walking depression  Prior Studies Any changes since last visit?  no  Physicians involved in your care Any changes since last visit?  no   Family History  Problem Relation Age of Onset  . Coronary artery disease Mother   . Valvular heart disease Father   . Hepatitis      sibling  . Coronary artery disease Sister   . Heart attack Sister   . Tuberculosis Father    History   Social History  . Marital Status: Married    Spouse Name: N/A    Number of Children: 3  . Years of Education: N/A   Occupational History  . retired Clinical research associate    Social History Main Topics  . Smoking status: Former Smoker -- 2.00 packs/day for 20 years    Types: Cigarettes    Quit date: 02/20/1978  . Smokeless tobacco: Never Used     Comment: smoked 1 ppd, stopped 1980  . Alcohol Use: No  . Drug Use: No  . Sexually Active: None   Other Topics Concern  . None   Social History Narrative  . None   Past Surgical History   Procedure Laterality Date  . Coronary artery bypass graft  03/2009  . Cataract extraction    . Vitrectomy    . Tonsillectomy    . Kidney transplant  2002  . Hernia repair    . Prostate cancer rx 2006 brachytherapy    . Prior peritoneal catheter placement    . Lumbar laminectomy/decompression microdiscectomy  05/16/2011    Procedure: LUMBAR LAMINECTOMY/DECOMPRESSION MICRODISCECTOMY 1 LEVEL;  Surgeon: Barnett Abu, MD;  Location: [facility];  Service: Neurosurgery;  Laterality: Bilateral;  Bilateral Lumbar Five-Sacral One Laminectomy   Past Medical History  Diagnosis Date  . CAD (coronary artery disease)     a. myoview 1/11: lg inf fixed defect;   b. cath 1/11: 3v CAD,  c. s/p CABG 1/11 c/b acute renal failure and AFib  . Atrial fibrillation   . Chronic diastolic heart failure     a. echo 4/11: EF 45-50%, mod LVH, LAE, inf and septal HK  . First degree AV block   . Orthostatic hypotension     pyridostigmine  . History of renal transplant   . DM2 (diabetes mellitus, type 2)   . Diabetic gastroparesis   . HTN (hypertension)   .  HLD (hyperlipidemia)   . PAD (peripheral artery disease)     s/p R SFA-Pop atherectomy 11/11  . Prostate cancer   . ED (erectile dysfunction)   . DDD (degenerative disc disease), lumbar   . Tremor   . Obesity   . Low testosterone   . Chronic kidney disease     s/p renal transplant 2002  . Pleural effusion, right     loculated  . COPD (chronic obstructive pulmonary disease)   . Anxiety   . Diabetes mellitus     insulin pump  . Movement disorder    BP 125/62  Pulse 68  Resp 17  Ht 6' 1.5" (1.867 m)  Wt 215 lb (97.523 kg)  BMI 27.98 kg/m2  SpO2 96%     Review of Systems  Respiratory: Positive for shortness of breath.   Musculoskeletal: Positive for gait problem.  Neurological: Positive for tremors.  Psychiatric/Behavioral: Positive for dysphoric mood.  All other systems reviewed and are negative.       Objective:   Physical  Exam  4+/5 strength in bilateral deltoid, biceps, triceps, grip  4/5 in bilateral hip flexors and knee extensors 0 at the right ankle dorsiflexor 2 at the right ankle plantar flexor 3+ at the left ankle dorsiflexor and plantar flexor  Psychiatric: Thought content normal..Affect brighter  Orientation to person, place, month, year Lungs with decreased breath sounds in the right base otherwise clear Extremities with 1-2+ edema Normal sensation in the upper limbs AFOs fitting well     Assessment & Plan:  1. Multifactor gait disorder.  A. Parkinson's disease which may be negatively affected by the Risperdal His does was reduced from 4.5 mg 3 mg. He plans to followup with psychiatry and hopefully reduce it again next time  B. Deconditioning after 5 month nursing home stay  C. Bilateral foot drop With poorly fitting Right AFO,Now has toe up orthosis on the right as well as left foot up orthosis D. Probable diabetic neuropathy Causing numbness in the hands and feet as well as bilateral foot drop  Continue home exercise program. Patient wishes to go to the gym however he will need pulmonary clearance prior to that time. He complains of increasing shortness of breath and plans to see his pulmonologist

## 2012-09-11 ENCOUNTER — Ambulatory Visit (INDEPENDENT_AMBULATORY_CARE_PROVIDER_SITE_OTHER)
Admission: RE | Admit: 2012-09-11 | Discharge: 2012-09-11 | Disposition: A | Discharge status: Home / Self Care | Payer: Medicare Other | Source: Ambulatory Visit | Attending: Critical Care Medicine | Admitting: Critical Care Medicine

## 2012-09-11 ENCOUNTER — Ambulatory Visit (INDEPENDENT_AMBULATORY_CARE_PROVIDER_SITE_OTHER): Payer: Medicare Other | Admitting: Critical Care Medicine

## 2012-09-11 ENCOUNTER — Encounter: Payer: Self-pay | Admitting: Critical Care Medicine

## 2012-09-11 VITALS — BP 134/74 | HR 55 | Temp 98.7°F | Ht 73.5 in

## 2012-09-11 DIAGNOSIS — J449 Chronic obstructive pulmonary disease, unspecified: Secondary | ICD-10-CM

## 2012-09-11 DIAGNOSIS — G903 Multi-system degeneration of the autonomic nervous system: Secondary | ICD-10-CM

## 2012-09-11 DIAGNOSIS — J90 Pleural effusion, not elsewhere classified: Secondary | ICD-10-CM

## 2012-09-11 DIAGNOSIS — J441 Chronic obstructive pulmonary disease with (acute) exacerbation: Secondary | ICD-10-CM

## 2012-09-11 DIAGNOSIS — G238 Other specified degenerative diseases of basal ganglia: Secondary | ICD-10-CM

## 2012-09-11 MED ORDER — PREDNISONE 10 MG PO TABS: ORAL_TABLET | ORAL | Status: DC

## 2012-09-11 MED ORDER — COMPRESSOR/NEBULIZER MISC: Status: DC

## 2012-09-11 MED ORDER — ALBUTEROL SULFATE (2.5 MG/3ML) 0.083% IN NEBU: 2.5000 mg | INHALATION_SOLUTION | Freq: Four times a day (QID) | RESPIRATORY_TRACT | Status: DC | PRN

## 2012-09-11 MED ORDER — PREDNISONE 5 MG PO TABS: ORAL_TABLET | ORAL | Status: DC

## 2012-09-11 NOTE — Progress Notes (Signed)
Subjective:    Patient ID: John Novel., male    DOB: [birth year], 72 y.o.   MRN: [id number]  HPI 72 y.o.  with history of renal transplant on chronic immunosuppressants, DM-insulin dependent, HTN, , Chronic back pain and CAD s/p CABG . Patient had a long and complicated hospital admission in 1/11 s/p CABG x 5.   Post-op course was complicated by pulmonary edema requiring intubation, renal failure requiring CVVH, and atrial fibrillation. He was in the hospital about 7 weeks. In 11/11, patient had right SFA and popliteal atherectomy that was complicated by a groin pseudoaneurysm. Hx of orthostatic hypotension, ShyDrager, and severe tremor followed by [facility]   07/22/2012 Dyspnea is the same.  Pt had diuretics increased and less fluid.  Not as much edema in LE No real mucus now.   Pt tolerating ANORO well and is off spirva and advair Pt denies any significant sore throat, nasal congestion or excess secretions, fever, chills, sweats, unintended weight loss, pleurtic or exertional chest pain, orthopnea PND, or leg swelling Pt denies any increase in rescue therapy over baseline, denies waking up needing it or having any early am or nocturnal exacerbations of coughing/wheezing/or dyspnea. Pt also denies any obvious fluctuation in symptoms with  weather or environmental change or other alleviating or aggravating factors  09/11/2012 Chief Complaint  Patient presents with  . Acute Visit    increased SOB with minimal activity x 1 month.  No wheezing, chest tightness, chest pain, or cough. Albuterol hfa doesn't seem to be working well.   Pt more dyspneic over the past month. No edema in feet.  No real chest pain.  No wheeze. No cough.  No mucus.   Notes more pndrip  No heartburn.   No qhs dyspnea.  Pt on mestinon for shy dragers Notes more headaches.    Past Medical History  Diagnosis Date  . CAD (coronary artery disease)     a. myoview 1/11: lg inf fixed defect;   b. cath 1/11: 3v CAD,  c.  s/p CABG 1/11 c/b acute renal failure and AFib  . Atrial fibrillation   . Chronic diastolic heart failure     a. echo 4/11: EF 45-50%, mod LVH, LAE, inf and septal HK  . First degree AV block   . Orthostatic hypotension     pyridostigmine  . History of renal transplant   . DM2 (diabetes mellitus, type 2)   . Diabetic gastroparesis   . HTN (hypertension)   . HLD (hyperlipidemia)   . PAD (peripheral artery disease)     s/p R SFA-Pop atherectomy 11/11  . Prostate cancer   . ED (erectile dysfunction)   . DDD (degenerative disc disease), lumbar   . Tremor   . Obesity   . Low testosterone   . Chronic kidney disease     s/p renal transplant 2002  . Pleural effusion, right     loculated  . COPD (chronic obstructive pulmonary disease)   . Anxiety   . Diabetes mellitus     insulin pump  . Movement disorder      Family History  Problem Relation Age of Onset  . Coronary artery disease Mother   . Valvular heart disease Father   . Hepatitis      sibling  . Coronary artery disease Sister   . Heart attack Sister   . Tuberculosis Father      History   Social History  . Marital Status: Married    Spouse Name:  N/A    Number of Children: 3  . Years of Education: N/A   Occupational History  . retired Clinical research associate    Social History Main Topics  . Smoking status: Former Smoker -- 2.00 packs/day for 20 years    Types: Cigarettes    Quit date: 02/20/1978  . Smokeless tobacco: Never Used     Comment: smoked 1 ppd, stopped 1980  . Alcohol Use: No  . Drug Use: No  . Sexually Active: Not on file   Other Topics Concern  . Not on file   Social History Narrative  . No narrative on file     No Known Allergies   Outpatient Prescriptions Prior to Visit  Medication Sig Dispense Refill  . albuterol (PROVENTIL HFA;VENTOLIN HFA) 108 (90 BASE) MCG/ACT inhaler Inhale 2 puffs into the lungs every 6 (six) hours as needed for wheezing or shortness of breath.  1 Inhaler  4  . aspirin 81 MG  tablet Take 81 mg by mouth daily.      Marland Kitchen atorvastatin (LIPITOR) 40 MG tablet Take 40 mg by mouth daily.       . calcium carbonate (OS-CAL - DOSED IN MG OF ELEMENTAL CALCIUM) 1250 MG tablet Take 2 tablets by mouth daily.      . carbidopa-levodopa (SINEMET CR) 50-200 MG per tablet TAKE ONE-HALF TABLET IN THE MORNING, ONE TABLET AT 11AM BEFORE LUNCHTIME AND ONE TABLET AT 5PM BEFORE DINNER.  75 tablet  7  . Cholecalciferol (VITAMIN D-3) 5000 UNITS TABS Take 2 tablets by mouth 3 (three) times a week.      . clindamycin (CLEOCIN T) 1 % lotion as needed. Apply as directed daily      . clobetasol (OLUX) 0.05 % topical foam Apply 1 application topically daily as needed. For head      . clopidogrel (PLAVIX) 75 MG tablet TAKE ONE TABLET BY MOUTH ONE TIME DAILY  30 tablet  0  . eszopiclone (LUNESTA) 2 MG TABS Take 2 mg by mouth at bedtime. Take immediately before bedtime      . ezetimibe (ZETIA) 10 MG tablet Take 10 mg by mouth daily.        . Ferrous Sulfate (IRON) 325 (65 FE) MG TABS Take 1 tablet by mouth daily.       . furosemide (LASIX) 40 MG tablet 2 tablets (total 80mg ) two times a day  120 tablet  6  . GLUCAGEN HYPOKIT 1 MG SOLR Use as directed if needed      . insulin aspart (NOVOLOG) 100 UNIT/ML injection Inject into the skin. Administered via insulin pump      . isosorbide mononitrate (IMDUR) 60 MG 24 hr tablet Take 1 tablet (60 mg total) by mouth daily.  90 tablet  1  . LamoTRIgine (LAMICTAL ODT PO) Take 200 mg by mouth daily.       Marland Kitchen LORazepam (ATIVAN) 0.5 MG tablet 0.5 mg as needed.       . Multiple Vitamin (MULITIVITAMIN WITH MINERALS) TABS Take 1 tablet by mouth daily.      . mycophenolate (CELLCEPT) 500 MG tablet Take 500 mg by mouth 2 (two) times daily.       . pantoprazole (PROTONIX) 40 MG tablet Take 1 tablet (40 mg total) by mouth daily.      . potassium chloride SA (K-DUR,KLOR-CON) 20 MEQ tablet Take 1 tablet (20 mEq total) by mouth daily.      Marland Kitchen pyridostigmine (MESTINON) 60 MG tablet  TAKE 2 TABLET  BY MOUTH IN THE MORNING AND TAKE 1 TABLET BY MOUTH IN THE AFTERNOON  90 tablet  3  . tacrolimus (PROGRAF) 5 MG capsule Take 5 mg by mouth 2 (two) times daily.        . Tamsulosin HCl (FLOMAX) 0.4 MG CAPS Take 0.8 mg by mouth daily.       . traMADol (ULTRAM) 50 MG tablet Take 50 mg by mouth as needed.       Marland Kitchen Umeclidinium-Vilanterol (ANORO ELLIPTA) 62.5-25 MCG/INH AEPB Inhale 1 puff into the lungs daily.  7 each  0  . Vilazodone HCl (VIIBRYD) 40 MG TABS Take 20 mg by mouth daily.       . vitamin C (ASCORBIC ACID) 500 MG tablet Take 1,000 mg by mouth daily.       . predniSONE (DELTASONE) 5 MG tablet Take 5 mg by mouth daily.        Marland Kitchen Umeclidinium-Vilanterol (ANORO ELLIPTA) 62.5-25 MCG/INH AEPB Inhale 1 puff into the lungs daily.  1 each  5  . folic acid (FOLVITE) 1 MG tablet Take 1 mg by mouth daily.       No facility-administered medications prior to visit.     Review of Systems  Constitutional:   No  weight loss, night sweats,  Fevers, chills,  +fatigue, or  lassitude.  HEENT:   No headaches,  Difficulty swallowing,  Tooth/dental problems, or  Sore throat,                No sneezing, itching, ear ache, nasal congestion, post nasal drip,   CV:  No chest pain,  Orthopnea, PND, swelling in lower extremities,  , dizziness,     GI  No heartburn, indigestion, abdominal pain, nausea, vomiting, diarrhea, change in bowel habits, loss of appetite, bloody stools.   Resp: +shortness of breath with exertion or at rest.   Notes  excess mucus, notes  productive cough,  No coughing up of blood.  Notes  change in color of mucus.  No wheezing.  No chest wall deformity  Skin: no rash or lesions.  GU: no dysuria, change in color of urine, no urgency or frequency.  No flank pain, no hematuria   MS:  +joint pain   +chronic  back pain.  Psych:  No change in mood or affect. No depression or anxiety.  No memory loss.          Objective:   Physical Exam  Filed Vitals:   09/11/12  0900  BP: 134/74  Pulse: 55  Temp: 98.7 F (37.1 C)  TempSrc: Oral  Height: 6' 1.5" (1.867 m)  SpO2: 93%    [id number] WM in no distress,  normal affect  ENT: No lesions,  mouth clear,  oropharynx clear, no postnasal drip  Neck: no JVD, no TMG, no carotid bruits  Lungs: No use of accessory muscles, no dullness to percussion diminshed BS in bases , no wheeze or rhonchi   Cardiovascular: RRR, heart sounds normal, no murmur or gallops, 2+ pretibial  peripheral edema  Abdomen: soft and NT, no HSM,  BS normal  Musculoskeletal: No deformities, no cyanosis or clubbing  Neuro: alert, non focal  Skin: echymoses       Assessment & Plan:   COPD (chronic obstructive pulmonary disease) gold stage D. Copd with gold stage D with exacerbation and flare Plan Start albuterol in nebulizer as needed 3-4 x daily Prednisone 10mg  Take 4 tablets daily for 5 days then stop, then resume 5mg  prednisone  PLEURAL EFFUSION, RIGHT Loculated R pleural effusions  Plan U/S thora of R pleural space      Updated Medication List Outpatient Encounter Prescriptions as of 09/11/2012  Medication Sig Dispense Refill  . albuterol (PROVENTIL HFA;VENTOLIN HFA) 108 (90 BASE) MCG/ACT inhaler Inhale 2 puffs into the lungs every 6 (six) hours as needed for wheezing or shortness of breath.  1 Inhaler  4  . aspirin 81 MG tablet Take 81 mg by mouth daily.      Marland Kitchen atorvastatin (LIPITOR) 40 MG tablet Take 40 mg by mouth daily.       . calcium carbonate (OS-CAL - DOSED IN MG OF ELEMENTAL CALCIUM) 1250 MG tablet Take 2 tablets by mouth daily.      . carbidopa-levodopa (SINEMET CR) 50-200 MG per tablet TAKE ONE-HALF TABLET IN THE MORNING, ONE TABLET AT 11AM BEFORE LUNCHTIME AND ONE TABLET AT 5PM BEFORE DINNER.  75 tablet  7  . Cholecalciferol (VITAMIN D-3) 5000 UNITS TABS Take 2 tablets by mouth 3 (three) times a week.      . clindamycin (CLEOCIN T) 1 % lotion as needed. Apply as directed daily      . clobetasol  (OLUX) 0.05 % topical foam Apply 1 application topically daily as needed. For head      . clopidogrel (PLAVIX) 75 MG tablet TAKE ONE TABLET BY MOUTH ONE TIME DAILY  30 tablet  0  . eszopiclone (LUNESTA) 2 MG TABS Take 2 mg by mouth at bedtime. Take immediately before bedtime      . ezetimibe (ZETIA) 10 MG tablet Take 10 mg by mouth daily.        . Ferrous Sulfate (IRON) 325 (65 FE) MG TABS Take 1 tablet by mouth daily.       . folic acid (FOLVITE) 400 MCG tablet Take 400 mcg by mouth daily.      . furosemide (LASIX) 40 MG tablet 2 tablets (total 80mg ) two times a day  120 tablet  6  . GLUCAGEN HYPOKIT 1 MG SOLR Use as directed if needed      . insulin aspart (NOVOLOG) 100 UNIT/ML injection Inject into the skin. Administered via insulin pump      . isosorbide mononitrate (IMDUR) 60 MG 24 hr tablet Take 1 tablet (60 mg total) by mouth daily.  90 tablet  1  . LamoTRIgine (LAMICTAL ODT PO) Take 200 mg by mouth daily.       Marland Kitchen LORazepam (ATIVAN) 0.5 MG tablet 0.5 mg as needed.       . Multiple Vitamin (MULITIVITAMIN WITH MINERALS) TABS Take 1 tablet by mouth daily.      . mycophenolate (CELLCEPT) 500 MG tablet Take 500 mg by mouth 2 (two) times daily.       . pantoprazole (PROTONIX) 40 MG tablet Take 1 tablet (40 mg total) by mouth daily.      . potassium chloride SA (K-DUR,KLOR-CON) 20 MEQ tablet Take 1 tablet (20 mEq total) by mouth daily.      . predniSONE (DELTASONE) 5 MG tablet HOLD while on 10mg  prednisone then resume when 10mg  is completed      . pyridostigmine (MESTINON) 60 MG tablet TAKE 2 TABLET BY MOUTH IN THE MORNING AND TAKE 1 TABLET BY MOUTH IN THE AFTERNOON  90 tablet  3  . tacrolimus (PROGRAF) 5 MG capsule Take 5 mg by mouth 2 (two) times daily.        . Tamsulosin HCl (FLOMAX) 0.4 MG CAPS Take 0.8 mg  by mouth daily.       . traMADol (ULTRAM) 50 MG tablet Take 50 mg by mouth as needed.       Marland Kitchen Umeclidinium-Vilanterol (ANORO ELLIPTA) 62.5-25 MCG/INH AEPB Inhale 1 puff into the lungs  daily.  7 each  0  . Vilazodone HCl (VIIBRYD) 40 MG TABS Take 20 mg by mouth daily.       . vitamin C (ASCORBIC ACID) 500 MG tablet Take 1,000 mg by mouth daily.       . [DISCONTINUED] predniSONE (DELTASONE) 5 MG tablet Take 5 mg by mouth daily.        . [DISCONTINUED] Umeclidinium-Vilanterol (ANORO ELLIPTA) 62.5-25 MCG/INH AEPB Inhale 1 puff into the lungs daily.  1 each  5  . albuterol (PROVENTIL) (2.5 MG/3ML) 0.083% nebulizer solution Take 3 mLs (2.5 mg total) by nebulization every 6 (six) hours as needed for wheezing.  180 mL  12  . Nebulizers (COMPRESSOR/NEBULIZER) MISC Use with albuterol  1 each  0  . predniSONE (DELTASONE) 10 MG tablet Take 4 tablets daily for 5 days then stop  20 tablet  0  . [DISCONTINUED] folic acid (FOLVITE) 1 MG tablet Take 1 mg by mouth daily.       No facility-administered encounter medications on file as of 09/11/2012.

## 2012-09-11 NOTE — Assessment & Plan Note (Signed)
Loculated R pleural effusions  Plan U/S thora of R pleural space

## 2012-09-11 NOTE — Patient Instructions (Addendum)
Start albuterol in nebulizer as needed 3-4 x daily Prednisone 10mg  Take 4 tablets daily for 5 days then stop, then resume 5mg  prednisone Chest xray today

## 2012-09-11 NOTE — Assessment & Plan Note (Signed)
Copd with gold stage D with exacerbation and flare Plan Start albuterol in nebulizer as needed 3-4 x daily Prednisone 10mg  Take 4 tablets daily for 5 days then stop, then resume 5mg  prednisone

## 2012-09-12 ENCOUNTER — Ambulatory Visit (HOSPITAL_COMMUNITY)
Admission: RE | Admit: 2012-09-12 | Discharge: 2012-09-12 | Disposition: A | Discharge status: Home / Self Care | Payer: Medicare Other | Source: Ambulatory Visit | Attending: Radiology | Admitting: Radiology

## 2012-09-12 ENCOUNTER — Ambulatory Visit (HOSPITAL_COMMUNITY)
Admission: RE | Admit: 2012-09-12 | Discharge: 2012-09-12 | Disposition: A | Discharge status: Home / Self Care | Payer: Medicare Other | Source: Ambulatory Visit | Attending: Critical Care Medicine | Admitting: Critical Care Medicine

## 2012-09-12 ENCOUNTER — Telehealth: Payer: Self-pay | Admitting: Critical Care Medicine

## 2012-09-12 DIAGNOSIS — J90 Pleural effusion, not elsewhere classified: Secondary | ICD-10-CM | POA: Insufficient documentation

## 2012-09-12 DIAGNOSIS — R0989 Other specified symptoms and signs involving the circulatory and respiratory systems: Secondary | ICD-10-CM | POA: Insufficient documentation

## 2012-09-12 DIAGNOSIS — J4489 Other specified chronic obstructive pulmonary disease: Secondary | ICD-10-CM | POA: Insufficient documentation

## 2012-09-12 DIAGNOSIS — J449 Chronic obstructive pulmonary disease, unspecified: Secondary | ICD-10-CM | POA: Insufficient documentation

## 2012-09-12 DIAGNOSIS — R0609 Other forms of dyspnea: Secondary | ICD-10-CM | POA: Insufficient documentation

## 2012-09-12 DIAGNOSIS — Z951 Presence of aortocoronary bypass graft: Secondary | ICD-10-CM | POA: Insufficient documentation

## 2012-09-12 MED ORDER — PREDNISONE 10 MG PO TABS: ORAL_TABLET | ORAL | Status: DC

## 2012-09-12 NOTE — Telephone Encounter (Signed)
I spoke with [facility]. Confirmed there system has been down x yesterday and is not able to pull/fill any RX's sent to them.  I spoke with spouse and she wants RX sent to [facility] on [address]. I have done so. Nothing further was needed

## 2012-09-12 NOTE — Procedures (Signed)
US guided therapeutic right thoracentesis performed yielding 150 cc's blood-tinged serous fluid. Due to the multiloculated nature of the collection only the above amount could be aspirated at this time despite catheter manipulation. F/u CXR pending. No immediate complications.

## 2012-09-20 ENCOUNTER — Ambulatory Visit (INDEPENDENT_AMBULATORY_CARE_PROVIDER_SITE_OTHER): Payer: Medicare Other | Admitting: Adult Health

## 2012-09-20 ENCOUNTER — Encounter: Payer: Self-pay | Admitting: Adult Health

## 2012-09-20 ENCOUNTER — Ambulatory Visit (INDEPENDENT_AMBULATORY_CARE_PROVIDER_SITE_OTHER)
Admission: RE | Admit: 2012-09-20 | Discharge: 2012-09-20 | Disposition: A | Discharge status: Home / Self Care | Payer: Medicare Other | Source: Ambulatory Visit | Attending: Adult Health | Admitting: Adult Health

## 2012-09-20 ENCOUNTER — Telehealth: Payer: Self-pay | Admitting: Critical Care Medicine

## 2012-09-20 VITALS — BP 120/62 | HR 74 | Temp 97.7°F

## 2012-09-20 DIAGNOSIS — J90 Pleural effusion, not elsewhere classified: Secondary | ICD-10-CM

## 2012-09-20 DIAGNOSIS — J449 Chronic obstructive pulmonary disease, unspecified: Secondary | ICD-10-CM

## 2012-09-20 MED ORDER — AMOXICILLIN-POT CLAVULANATE 875-125 MG PO TABS: 1.0000 | ORAL_TABLET | Freq: Two times a day (BID) | ORAL | Status: AC

## 2012-09-20 NOTE — Telephone Encounter (Deleted)
j

## 2012-09-20 NOTE — Patient Instructions (Addendum)
Augmentin 875mg  Twice daily  For 7 days -take w/ food  Increase Prednisone 20mg  daily for 3 days then hold at 10mg  daily until seen back Low salt diet .  Elevated legs when sitting follow up Dr. Delford Field  Next month as planned  Please contact office for sooner follow up if symptoms do not improve or worsen or seek emergency care

## 2012-09-20 NOTE — Telephone Encounter (Signed)
Called, spoke with pt's wife.  Pt was seen by PW last week.  On Monday, he had an US guided thora.  Wife reports pt has been lethargic most of this week and has increased DOE x 3 days.  Reports his ankles are swollen this am and usually they go down during the night.  He has nasal congestion with clear mucus.  No wheezing, chest tightness, cough, or f/c/s.  She is requesting OV today.  We have scheduled pt to see TP today at 11 am at [facility].  Wife aware and was very appreciative of this.

## 2012-09-20 NOTE — Progress Notes (Signed)
Subjective:    Patient ID: John Novel., male    DOB: April 22, 1940, 72 y.o.   MRN: [id number]  HPI 72 y.o.  with history of renal transplant on chronic immunosuppressants, DM-insulin dependent, HTN, , Chronic back pain and CAD s/p CABG . Patient had a long and complicated hospital admission in 1/11 s/p CABG x 5.   Post-op course was complicated by pulmonary edema requiring intubation, renal failure requiring CVVH, and atrial fibrillation. He was in the hospital about 7 weeks. In 11/11, patient had right SFA and popliteal atherectomy that was complicated by a groin pseudoaneurysm. Hx of orthostatic hypotension, ShyDrager, and severe tremor followed by Dohmeier   07/22/2012 Dyspnea is the same.  Pt had diuretics increased and less fluid.  Not as much edema in LE No real mucus now.   Pt tolerating ANORO well and is off spirva and advair  09/11/2012 Chief Complaint  Patient presents with  . Acute Visit    increased SOB with minimal activity x 1 month.  No wheezing, chest tightness, chest pain, or cough. Albuterol hfa doesn't seem to be working well.   Pt more dyspneic over the past month. No edema in feet.   >>Thoracentesis 09/12/12 , pred burst to taper to 5mg  daily   09/20/2012 Acute OV  Complains of increased SOB x3days w/ LE edema worse this AM. dry hacking cough at night x1 week.  denies wheezing, tightness, f/c/s, PND. Underwent thoracentesis w/ 150cc removed  On 09/12/12  No fluid sent for analysis  No improvement in symptoms. Continues to have significant DOE .  Remain on lasix 160mg  daily  On prednisone 5mg  daily currently.  No hemoptysis, fever, chest pain, increased leg swelling. No discolored mucus. No n/v.     No Known Allergies     Review of Systems  Constitutional:   No  weight loss, night sweats,  Fevers, chills,  +fatigue, or  lassitude.  HEENT:   No headaches,  Difficulty swallowing,  Tooth/dental problems, or  Sore throat,                No sneezing, itching,  ear ache, nasal congestion, post nasal drip,   CV:  No chest pain,  Orthopnea, PND, swelling in lower extremities,  , dizziness,     GI  No heartburn, indigestion, abdominal pain, nausea, vomiting, diarrhea, change in bowel habits, loss of appetite, bloody stools.   Resp: +shortness of breath with exertion or at rest.   No wheezing.  No chest wall deformity  Skin: no rash or lesions.  GU: no dysuria, change in color of urine, no urgency or frequency.  No flank pain, no hematuria   MS:  +joint pain   +chronic  back pain.  Psych:  No change in mood or affect. No depression or anxiety.  No memory loss.          Objective:   Physical Exam  WUJ:WJXBJYN WM in no distress,  normal affect  ENT: No lesions,  mouth clear,  oropharynx clear, no postnasal drip  Neck: no JVD, no TMG, no carotid bruits  Lungs: No use of accessory muscles, no dullness to percussion diminshed BS in bases , no wheeze or rhonchi   Cardiovascular: RRR, heart sounds normal, 2/6 SM ,1-  2+ pretibial  peripheral edema  Abdomen: soft and NT, no HSM,  BS normal  Musculoskeletal: No deformities, no cyanosis or clubbing  Neuro: alert, non focal  Skin: echymosis along forearms     09/21/11  CXR with decreased aeration , loculated pleural effusion on right , ? atx vs infiltrate on right , basilar atx on left    Assessment & Plan:

## 2012-10-01 ENCOUNTER — Telehealth: Payer: Self-pay | Admitting: Critical Care Medicine

## 2012-10-01 NOTE — Telephone Encounter (Signed)
appt set with Gwenyth Allegra, CMA

## 2012-10-01 NOTE — Telephone Encounter (Signed)
Pt last saw TP on 09-20-12 and was prescribed Augmentin and prednisone 20mg  x 3 days, then 10 mg daily and stay. Pt wife states that the pt is having increased SOB and increased lower extremity swelling x 2 days. She states they are propping his feet up as much as possible. She states the swelling used to get better as the day went on but now it is not improving at all. He denies any fever, chest congestion, cough at this time. Pt spouse states that PW prescribed an additional fluid pill in the past to take with his lasix that helped.  Since PW is out of office and regular doc-of-the day is off this PM I will forward the message to [facility] since he has seen the pt in the past. Please advise. Carron Curie, CMA No Known Allergies

## 2012-10-01 NOTE — Telephone Encounter (Signed)
I think he needs ov for closer evaluation.

## 2012-10-01 NOTE — Assessment & Plan Note (Addendum)
Flare with recurrent  loculated right pleural effusion  No   repeat thoracentesis at this time -case and xray reviewed in detail with Dr. Delford Field    Plan  Augmentin 875mg  Twice daily  For 7 days -take w/ food  Increase Prednisone 20mg  daily for 3 days then hold at 10mg  daily until seen back Low salt diet .  Elevated legs when sitting follow up Dr. Delford Field  Next month as planned  Please contact office for sooner follow up if symptoms do not improve or worsen or seek emergency care

## 2012-10-02 ENCOUNTER — Ambulatory Visit (INDEPENDENT_AMBULATORY_CARE_PROVIDER_SITE_OTHER): Payer: Medicare Other | Admitting: Pulmonary Disease

## 2012-10-02 ENCOUNTER — Ambulatory Visit (INDEPENDENT_AMBULATORY_CARE_PROVIDER_SITE_OTHER)
Admission: RE | Admit: 2012-10-02 | Discharge: 2012-10-02 | Disposition: A | Discharge status: Home / Self Care | Payer: Medicare Other | Source: Ambulatory Visit | Attending: Pulmonary Disease | Admitting: Pulmonary Disease

## 2012-10-02 ENCOUNTER — Other Ambulatory Visit (INDEPENDENT_AMBULATORY_CARE_PROVIDER_SITE_OTHER): Payer: Medicare Other

## 2012-10-02 ENCOUNTER — Encounter: Payer: Self-pay | Admitting: Pulmonary Disease

## 2012-10-02 VITALS — BP 120/60 | HR 55 | Temp 97.5°F | Ht 73.5 in

## 2012-10-02 DIAGNOSIS — J9610 Chronic respiratory failure, unspecified whether with hypoxia or hypercapnia: Secondary | ICD-10-CM

## 2012-10-02 DIAGNOSIS — R0609 Other forms of dyspnea: Secondary | ICD-10-CM

## 2012-10-02 DIAGNOSIS — R0600 Dyspnea, unspecified: Secondary | ICD-10-CM

## 2012-10-02 DIAGNOSIS — J449 Chronic obstructive pulmonary disease, unspecified: Secondary | ICD-10-CM

## 2012-10-02 DIAGNOSIS — R0989 Other specified symptoms and signs involving the circulatory and respiratory systems: Secondary | ICD-10-CM

## 2012-10-02 LAB — CBC WITH DIFFERENTIAL/PLATELET
Basophils Absolute: 0 10*3/uL (ref 0.0–0.1)
Eosinophils Absolute: 0.1 10*3/uL (ref 0.0–0.7)
Hemoglobin: 10.2 g/dL — ABNORMAL LOW (ref 13.0–17.0)
Lymphocytes Relative: 6.3 % — ABNORMAL LOW (ref 12.0–46.0)
Lymphs Abs: 0.5 10*3/uL — ABNORMAL LOW (ref 0.7–4.0)
MCHC: 32 g/dL (ref 30.0–36.0)
Monocytes Relative: 3.4 % (ref 3.0–12.0)
Neutro Abs: 6.9 10*3/uL (ref 1.4–7.7)
Platelets: 141 10*3/uL — ABNORMAL LOW (ref 150.0–400.0)
RDW: 19.2 % — ABNORMAL HIGH (ref 11.5–14.6)

## 2012-10-02 LAB — BRAIN NATRIURETIC PEPTIDE: Pro B Natriuretic peptide (BNP): 662 pg/mL — ABNORMAL HIGH (ref 0.0–100.0)

## 2012-10-02 NOTE — Assessment & Plan Note (Signed)
The patient has a history of severe COPD, but I do not believe that his current worsening shortness of breath is related to this.  He has been treated with a course of prednisone, with no significant improvement.  He has no active bronchospasm on exam today.

## 2012-10-02 NOTE — Progress Notes (Signed)
  Subjective:    Patient ID: John Novel., male    DOB: [birth year], 72 y.o.   MRN: [id number]  HPI The patient comes in today for an acute sick visit.  He has multiple medical problems, including COPD which is oxygen dependent as well as a history of a renal transplant.  He gives at least a three-week history of worsening shortness of breath, and recently was treated with Augmentin and a course of prednisone.  He and his wife do not feel this made a significant improvement to his breathing, even when he was on higher doses of prednisone.  Over the last 10 days, he has seen a significant worsening of his breathing, to the point that he will get winded just walking through his house.  He denies having significant shortness of breath at rest as long as he is wearing his oxygen.  He denies any significant cough, mucus, or purulence.  He has had significant worsening of his lower extremity edema, and his spouse believes he has had increasing abdominal girth as well.  Unfortunately, he is not allowed [place] to weigh him over the last 3 visits, and therefore we have nothing for comparison.  He is followed closely by nephrology, but his followup appointment is not until next month.   Review of Systems  Constitutional: Negative for fever and unexpected weight change.  HENT: Negative for ear pain, nosebleeds, congestion, sore throat, rhinorrhea, sneezing, trouble swallowing, dental problem, postnasal drip and sinus pressure.   Eyes: Negative for redness and itching.  Respiratory: Positive for cough and shortness of breath. Negative for chest tightness and wheezing.   Cardiovascular: Positive for leg swelling. Negative for palpitations.  Gastrointestinal: Negative for nausea and vomiting.  Genitourinary: Negative for dysuria.  Musculoskeletal: Negative for joint swelling.  Skin: Negative for rash.  Neurological: Negative for headaches.  Hematological: Does not bruise/bleed easily.  Psychiatric/Behavioral:  Negative for dysphoric mood. The patient is not nervous/anxious.        Objective:   Physical Exam Frail-appearing male in no acute distress.  No increased work of breathing noted Nose without purulence or discharge seen Neck without lymphadenopathy or thyromegaly Chest with decreased breath sounds, but no active wheezing.  There were a few bibasilar crackles. Cardiac exam with regular rate and rhythm, 2/6 systolic murmur Lower extremities with 1-2+ edema bilaterally, no cyanosis Alert and oriented, moves all 4 extremities.       Assessment & Plan:

## 2012-10-02 NOTE — Patient Instructions (Signed)
Will check ultrasound of lower extremities to make sure no blood clots Will check chest xray today, as well as some bloodwork. Take an extra 40mg  of lasix tonight once home, and take an extra 40mg  dose in am one time to see if helps with fluid. Will try to get apptm with nephrology moved up. If we are not making progress with fluid removal, may need admission to hospital.

## 2012-10-02 NOTE — Assessment & Plan Note (Signed)
The patient has progressive dyspnea, however does not have shortness of breath at rest and currently has no increased work of breathing.  His oxygen saturations are at his baseline from the prior visit.  He has had significant worsening in lower extremity edema and increasing abdominal girth, and I suspect the primary issue here is volume overload.  I will check lab studies today to make sure that his kidneys are functioning properly, and we'll give him a few extra doses of diuretic.  Given that he has had a recent renal transplant, we'll try and move up his appointment with nephrology.  I would also consider whether he may have DVT with thromboembolic disease, and we'll check lower extremity venous Dopplers.  He also has a history of a loculated right pleural effusion, and we'll check a chest x-ray to make sure this has not worsened.  If he does not show improvement, or if he does worsen, he will require hospitalization for further treatment.

## 2012-10-03 ENCOUNTER — Encounter (INDEPENDENT_AMBULATORY_CARE_PROVIDER_SITE_OTHER): Payer: Medicare Other

## 2012-10-03 DIAGNOSIS — R609 Edema, unspecified: Secondary | ICD-10-CM

## 2012-10-03 DIAGNOSIS — R0600 Dyspnea, unspecified: Secondary | ICD-10-CM

## 2012-10-03 LAB — BASIC METABOLIC PANEL
BUN: 42 mg/dL — ABNORMAL HIGH (ref 6–23)
Creatinine, Ser: 1.5 mg/dL (ref 0.4–1.5)
GFR: 50.75 mL/min — ABNORMAL LOW (ref >60.00)
Glucose, Bld: 181 mg/dL — ABNORMAL HIGH (ref 70–99)

## 2012-10-04 ENCOUNTER — Inpatient Hospital Stay (HOSPITAL_COMMUNITY)
Admission: EM | Admit: 2012-10-04 | Discharge: 2012-10-14 | DRG: 291 | Disposition: A | Discharge status: Hospice | Payer: Medicare Other | Attending: Internal Medicine | Admitting: Internal Medicine

## 2012-10-04 ENCOUNTER — Encounter (HOSPITAL_COMMUNITY): Payer: Self-pay | Admitting: Emergency Medicine

## 2012-10-04 ENCOUNTER — Emergency Department (HOSPITAL_COMMUNITY): Payer: Medicare Other

## 2012-10-04 ENCOUNTER — Telehealth: Payer: Self-pay | Admitting: *Deleted

## 2012-10-04 DIAGNOSIS — E7800 Pure hypercholesterolemia, unspecified: Secondary | ICD-10-CM

## 2012-10-04 DIAGNOSIS — R627 Adult failure to thrive: Secondary | ICD-10-CM | POA: Diagnosis present

## 2012-10-04 DIAGNOSIS — E119 Type 2 diabetes mellitus without complications: Secondary | ICD-10-CM

## 2012-10-04 DIAGNOSIS — M79609 Pain in unspecified limb: Secondary | ICD-10-CM

## 2012-10-04 DIAGNOSIS — I272 Pulmonary hypertension, unspecified: Secondary | ICD-10-CM

## 2012-10-04 DIAGNOSIS — J449 Chronic obstructive pulmonary disease, unspecified: Secondary | ICD-10-CM | POA: Diagnosis present

## 2012-10-04 DIAGNOSIS — I2510 Atherosclerotic heart disease of native coronary artery without angina pectoris: Secondary | ICD-10-CM

## 2012-10-04 DIAGNOSIS — Z951 Presence of aortocoronary bypass graft: Secondary | ICD-10-CM

## 2012-10-04 DIAGNOSIS — N179 Acute kidney failure, unspecified: Secondary | ICD-10-CM | POA: Diagnosis present

## 2012-10-04 DIAGNOSIS — E1140 Type 2 diabetes mellitus with diabetic neuropathy, unspecified: Secondary | ICD-10-CM

## 2012-10-04 DIAGNOSIS — F3289 Other specified depressive episodes: Secondary | ICD-10-CM | POA: Diagnosis present

## 2012-10-04 DIAGNOSIS — I7389 Other specified peripheral vascular diseases: Secondary | ICD-10-CM

## 2012-10-04 DIAGNOSIS — IMO0002 Reserved for concepts with insufficient information to code with codable children: Secondary | ICD-10-CM

## 2012-10-04 DIAGNOSIS — Z87891 Personal history of nicotine dependence: Secondary | ICD-10-CM

## 2012-10-04 DIAGNOSIS — Z9181 History of falling: Secondary | ICD-10-CM

## 2012-10-04 DIAGNOSIS — Z66 Do not resuscitate: Secondary | ICD-10-CM | POA: Diagnosis not present

## 2012-10-04 DIAGNOSIS — I252 Old myocardial infarction: Secondary | ICD-10-CM

## 2012-10-04 DIAGNOSIS — I5032 Chronic diastolic (congestive) heart failure: Secondary | ICD-10-CM | POA: Diagnosis present

## 2012-10-04 DIAGNOSIS — Z79899 Other long term (current) drug therapy: Secondary | ICD-10-CM

## 2012-10-04 DIAGNOSIS — G903 Multi-system degeneration of the autonomic nervous system: Secondary | ICD-10-CM

## 2012-10-04 DIAGNOSIS — J9819 Other pulmonary collapse: Secondary | ICD-10-CM | POA: Diagnosis not present

## 2012-10-04 DIAGNOSIS — R1310 Dysphagia, unspecified: Secondary | ICD-10-CM | POA: Diagnosis present

## 2012-10-04 DIAGNOSIS — J90 Pleural effusion, not elsewhere classified: Secondary | ICD-10-CM

## 2012-10-04 DIAGNOSIS — G238 Other specified degenerative diseases of basal ganglia: Secondary | ICD-10-CM | POA: Diagnosis present

## 2012-10-04 DIAGNOSIS — Z9911 Dependence on respirator [ventilator] status: Secondary | ICD-10-CM

## 2012-10-04 DIAGNOSIS — R0600 Dyspnea, unspecified: Secondary | ICD-10-CM

## 2012-10-04 DIAGNOSIS — J9610 Chronic respiratory failure, unspecified whether with hypoxia or hypercapnia: Secondary | ICD-10-CM

## 2012-10-04 DIAGNOSIS — E1149 Type 2 diabetes mellitus with other diabetic neurological complication: Secondary | ICD-10-CM | POA: Diagnosis present

## 2012-10-04 DIAGNOSIS — I739 Peripheral vascular disease, unspecified: Secondary | ICD-10-CM

## 2012-10-04 DIAGNOSIS — M479 Spondylosis, unspecified: Secondary | ICD-10-CM

## 2012-10-04 DIAGNOSIS — I319 Disease of pericardium, unspecified: Secondary | ICD-10-CM

## 2012-10-04 DIAGNOSIS — I4891 Unspecified atrial fibrillation: Secondary | ICD-10-CM

## 2012-10-04 DIAGNOSIS — R339 Retention of urine, unspecified: Secondary | ICD-10-CM

## 2012-10-04 DIAGNOSIS — I440 Atrioventricular block, first degree: Secondary | ICD-10-CM

## 2012-10-04 DIAGNOSIS — Z9641 Presence of insulin pump (external) (internal): Secondary | ICD-10-CM

## 2012-10-04 DIAGNOSIS — D649 Anemia, unspecified: Secondary | ICD-10-CM | POA: Diagnosis present

## 2012-10-04 DIAGNOSIS — E785 Hyperlipidemia, unspecified: Secondary | ICD-10-CM

## 2012-10-04 DIAGNOSIS — F411 Generalized anxiety disorder: Secondary | ICD-10-CM | POA: Diagnosis present

## 2012-10-04 DIAGNOSIS — F05 Delirium due to known physiological condition: Secondary | ICD-10-CM

## 2012-10-04 DIAGNOSIS — Z515 Encounter for palliative care: Secondary | ICD-10-CM

## 2012-10-04 DIAGNOSIS — Z940 Kidney transplant status: Secondary | ICD-10-CM

## 2012-10-04 DIAGNOSIS — E875 Hyperkalemia: Secondary | ICD-10-CM | POA: Diagnosis present

## 2012-10-04 DIAGNOSIS — R9430 Abnormal result of cardiovascular function study, unspecified: Secondary | ICD-10-CM

## 2012-10-04 DIAGNOSIS — J4489 Other specified chronic obstructive pulmonary disease: Secondary | ICD-10-CM | POA: Diagnosis present

## 2012-10-04 DIAGNOSIS — F329 Major depressive disorder, single episode, unspecified: Secondary | ICD-10-CM | POA: Diagnosis present

## 2012-10-04 DIAGNOSIS — I5033 Acute on chronic diastolic (congestive) heart failure: Principal | ICD-10-CM

## 2012-10-04 DIAGNOSIS — M549 Dorsalgia, unspecified: Secondary | ICD-10-CM

## 2012-10-04 DIAGNOSIS — N390 Urinary tract infection, site not specified: Secondary | ICD-10-CM | POA: Diagnosis present

## 2012-10-04 DIAGNOSIS — N189 Chronic kidney disease, unspecified: Secondary | ICD-10-CM

## 2012-10-04 DIAGNOSIS — Z794 Long term (current) use of insulin: Secondary | ICD-10-CM

## 2012-10-04 DIAGNOSIS — G20 Parkinson's disease: Secondary | ICD-10-CM

## 2012-10-04 DIAGNOSIS — I509 Heart failure, unspecified: Secondary | ICD-10-CM

## 2012-10-04 DIAGNOSIS — J9600 Acute respiratory failure, unspecified whether with hypoxia or hypercapnia: Secondary | ICD-10-CM

## 2012-10-04 DIAGNOSIS — M21371 Foot drop, right foot: Secondary | ICD-10-CM

## 2012-10-04 DIAGNOSIS — I359 Nonrheumatic aortic valve disorder, unspecified: Secondary | ICD-10-CM | POA: Diagnosis present

## 2012-10-04 DIAGNOSIS — M216X9 Other acquired deformities of unspecified foot: Secondary | ICD-10-CM | POA: Diagnosis present

## 2012-10-04 DIAGNOSIS — G20A1 Parkinson's disease without dyskinesia, without mention of fluctuations: Secondary | ICD-10-CM

## 2012-10-04 DIAGNOSIS — I25810 Atherosclerosis of coronary artery bypass graft(s) without angina pectoris: Secondary | ICD-10-CM

## 2012-10-04 DIAGNOSIS — M21372 Foot drop, left foot: Secondary | ICD-10-CM

## 2012-10-04 DIAGNOSIS — J9620 Acute and chronic respiratory failure, unspecified whether with hypoxia or hypercapnia: Secondary | ICD-10-CM

## 2012-10-04 DIAGNOSIS — Z9981 Dependence on supplemental oxygen: Secondary | ICD-10-CM

## 2012-10-04 DIAGNOSIS — Z6825 Body mass index (BMI) 25.0-25.9, adult: Secondary | ICD-10-CM

## 2012-10-04 DIAGNOSIS — R5381 Other malaise: Secondary | ICD-10-CM

## 2012-10-04 DIAGNOSIS — I441 Atrioventricular block, second degree: Secondary | ICD-10-CM | POA: Diagnosis present

## 2012-10-04 DIAGNOSIS — I2789 Other specified pulmonary heart diseases: Secondary | ICD-10-CM | POA: Diagnosis present

## 2012-10-04 DIAGNOSIS — I951 Orthostatic hypotension: Secondary | ICD-10-CM

## 2012-10-04 DIAGNOSIS — Z8546 Personal history of malignant neoplasm of prostate: Secondary | ICD-10-CM

## 2012-10-04 HISTORY — DX: Parkinson's disease without dyskinesia, without mention of fluctuations: G20.A1

## 2012-10-04 HISTORY — DX: Iron deficiency anemia, unspecified: D50.9

## 2012-10-04 HISTORY — DX: Personal history of other medical treatment: Z92.89

## 2012-10-04 HISTORY — DX: Unspecified malignant neoplasm of skin, unspecified: C44.90

## 2012-10-04 HISTORY — DX: Shortness of breath: R06.02

## 2012-10-04 HISTORY — DX: Depression, unspecified: F32.A

## 2012-10-04 HISTORY — DX: Dependence on supplemental oxygen: Z99.81

## 2012-10-04 HISTORY — DX: Major depressive disorder, single episode, unspecified: F32.9

## 2012-10-04 HISTORY — DX: Adverse effect of unspecified anesthetic, initial encounter: T41.45XA

## 2012-10-04 HISTORY — DX: Nonrheumatic aortic (valve) stenosis: I35.0

## 2012-10-04 HISTORY — DX: Conduction disorder, unspecified: I45.9

## 2012-10-04 HISTORY — DX: Abnormal findings on diagnostic imaging of other specified body structures: R93.89

## 2012-10-04 HISTORY — DX: Heart failure, unspecified: I50.9

## 2012-10-04 HISTORY — DX: Other complications of anesthesia, initial encounter: T88.59XA

## 2012-10-04 HISTORY — DX: Parkinson's disease: G20

## 2012-10-04 LAB — URINALYSIS, ROUTINE W REFLEX MICROSCOPIC
Bilirubin Urine: NEGATIVE
Glucose, UA: NEGATIVE mg/dL
Ketones, ur: NEGATIVE mg/dL
Nitrite: POSITIVE — AB
Protein, ur: NEGATIVE mg/dL
Specific Gravity, Urine: 1.01 (ref 1.005–1.030)
Urobilinogen, UA: 1 mg/dL (ref 0.0–1.0)
pH: 5 (ref 5.0–8.0)

## 2012-10-04 LAB — CBC
HCT: 33.1 % — ABNORMAL LOW (ref 39.0–52.0)
HCT: 33 % — ABNORMAL LOW (ref 39.0–52.0)
Hemoglobin: 10.7 g/dL — ABNORMAL LOW (ref 13.0–17.0)
Hemoglobin: 10.3 g/dL — ABNORMAL LOW (ref 13.0–17.0)
MCH: 25.7 pg — ABNORMAL LOW (ref 26.0–34.0)
MCH: 25.2 pg — ABNORMAL LOW (ref 26.0–34.0)
MCHC: 32.3 g/dL (ref 30.0–36.0)
MCHC: 31.2 g/dL (ref 30.0–36.0)
MCV: 79.6 fL (ref 78.0–100.0)
Platelets: 126 K/uL — ABNORMAL LOW (ref 150–400)
RBC: 4.16 MIL/uL — ABNORMAL LOW (ref 4.22–5.81)
RDW: 17.7 % — ABNORMAL HIGH (ref 11.5–15.5)
WBC: 8.4 K/uL (ref 4.0–10.5)

## 2012-10-04 LAB — BASIC METABOLIC PANEL
BUN: 44 mg/dL — ABNORMAL HIGH (ref 6–23)
CO2: 21 mEq/L (ref 19–32)
Chloride: 104 mEq/L (ref 96–112)
Creatinine, Ser: 1.39 mg/dL — ABNORMAL HIGH (ref 0.50–1.35)

## 2012-10-04 LAB — URINE MICROSCOPIC-ADD ON

## 2012-10-04 LAB — POCT I-STAT TROPONIN I

## 2012-10-04 LAB — CREATININE, SERUM: GFR calc non Af Amer: 47 mL/min — ABNORMAL LOW (ref >90)

## 2012-10-04 LAB — GLUCOSE, CAPILLARY: Glucose-Capillary: 177 mg/dL — ABNORMAL HIGH (ref 70–99)

## 2012-10-04 MED ORDER — MORPHINE SULFATE 2 MG/ML IJ SOLN: 2.0000 mg | Freq: Once | INTRAMUSCULAR | Status: AC

## 2012-10-04 MED ORDER — VITAMIN C 500 MG PO TABS
1000.0000 mg | ORAL_TABLET | Freq: Every day | ORAL | Status: DC
  Administered 2012-10-07 – 2012-10-14 (×6): 1000 mg via ORAL
  Filled 2012-10-04 (×11): qty 2

## 2012-10-04 MED ORDER — FUROSEMIDE 10 MG/ML IJ SOLN
80.0000 mg | Freq: Two times a day (BID) | INTRAMUSCULAR | Status: DC
  Filled 2012-10-04 (×2): qty 8

## 2012-10-04 MED ORDER — PREDNISONE 5 MG PO TABS
5.0000 mg | ORAL_TABLET | Freq: Every day | ORAL | Status: DC
  Filled 2012-10-04 (×2): qty 1

## 2012-10-04 MED ORDER — SODIUM CHLORIDE 0.9 % IJ SOLN: 3.0000 mL | INTRAMUSCULAR | Status: DC | PRN

## 2012-10-04 MED ORDER — UMECLIDINIUM-VILANTEROL 62.5-25 MCG/INH IN AEPB
1.0000 | INHALATION_SPRAY | Freq: Every day | RESPIRATORY_TRACT | Status: DC
  Administered 2012-10-06 – 2012-10-07 (×2): 1 via RESPIRATORY_TRACT

## 2012-10-04 MED ORDER — ATROPINE SULFATE 1 MG/ML IJ SOLN: 0.5000 mg | Freq: Once | INTRAMUSCULAR | Status: DC

## 2012-10-04 MED ORDER — SODIUM CHLORIDE 0.9 % IJ SOLN
3.0000 mL | Freq: Two times a day (BID) | INTRAMUSCULAR | Status: DC
  Administered 2012-10-04 – 2012-10-09 (×8): 3 mL via INTRAVENOUS

## 2012-10-04 MED ORDER — PHENYLEPHRINE 200 MCG/ML FOR PRIAPISM / HYPOTENSION: 200.0000 ug | INTRAMUSCULAR | Status: DC | PRN

## 2012-10-04 MED ORDER — VILAZODONE HCL 40 MG PO TABS
20.0000 mg | ORAL_TABLET | Freq: Every day | ORAL | Status: DC
  Administered 2012-10-05 – 2012-10-11 (×7): 20 mg via ORAL
  Filled 2012-10-04 (×10): qty 0.5

## 2012-10-04 MED ORDER — METOLAZONE 5 MG PO TABS
5.0000 mg | ORAL_TABLET | Freq: Once | ORAL | Status: AC
  Administered 2012-10-05: 5 mg via ORAL
  Filled 2012-10-04: qty 1

## 2012-10-04 MED ORDER — FUROSEMIDE 10 MG/ML IJ SOLN
40.0000 mg | Freq: Once | INTRAMUSCULAR | Status: AC
  Administered 2012-10-05: 40 mg via INTRAVENOUS
  Filled 2012-10-04: qty 4

## 2012-10-04 MED ORDER — EZETIMIBE 10 MG PO TABS
10.0000 mg | ORAL_TABLET | Freq: Every day | ORAL | Status: DC
  Filled 2012-10-04 (×2): qty 1

## 2012-10-04 MED ORDER — FUROSEMIDE 10 MG/ML IJ SOLN
40.0000 mg | Freq: Two times a day (BID) | INTRAMUSCULAR | Status: DC
  Administered 2012-10-04: 40 mg via INTRAVENOUS
  Filled 2012-10-04: qty 4

## 2012-10-04 MED ORDER — ATORVASTATIN CALCIUM 40 MG PO TABS
40.0000 mg | ORAL_TABLET | Freq: Every day | ORAL | Status: DC
  Filled 2012-10-04 (×2): qty 1

## 2012-10-04 MED ORDER — FERROUS SULFATE 325 (65 FE) MG PO TABS
325.0000 mg | ORAL_TABLET | Freq: Every day | ORAL | Status: DC
  Filled 2012-10-04 (×3): qty 1

## 2012-10-04 MED ORDER — MYCOPHENOLATE MOFETIL 250 MG PO CAPS
500.0000 mg | ORAL_CAPSULE | Freq: Two times a day (BID) | ORAL | Status: DC
  Administered 2012-10-05 – 2012-10-11 (×13): 500 mg via ORAL
  Filled 2012-10-04 (×17): qty 2

## 2012-10-04 MED ORDER — ONDANSETRON HCL 4 MG/2ML IJ SOLN
4.0000 mg | Freq: Four times a day (QID) | INTRAMUSCULAR | Status: DC | PRN
  Administered 2012-10-11: 4 mg via INTRAVENOUS
  Filled 2012-10-04: qty 2

## 2012-10-04 MED ORDER — ATROPINE SULFATE 1 MG/ML IJ SOLN
0.5000 mg | Freq: Once | INTRAMUSCULAR | Status: AC | PRN
  Filled 2012-10-04: qty 1
  Filled 2012-10-04: qty 0.5

## 2012-10-04 MED ORDER — ISOSORBIDE MONONITRATE ER 60 MG PO TB24
60.0000 mg | ORAL_TABLET | Freq: Every day | ORAL | Status: DC
  Administered 2012-10-05 – 2012-10-11 (×7): 60 mg via ORAL
  Filled 2012-10-04 (×9): qty 1

## 2012-10-04 MED ORDER — FUROSEMIDE 10 MG/ML IJ SOLN
80.0000 mg | Freq: Once | INTRAMUSCULAR | Status: AC
  Administered 2012-10-04: 80 mg via INTRAVENOUS
  Filled 2012-10-04: qty 8

## 2012-10-04 MED ORDER — ALBUTEROL SULFATE (5 MG/ML) 0.5% IN NEBU
5.0000 mg | INHALATION_SOLUTION | Freq: Once | RESPIRATORY_TRACT | Status: DC
  Filled 2012-10-04: qty 0.5

## 2012-10-04 MED ORDER — ACETAMINOPHEN 325 MG PO TABS: 650.0000 mg | ORAL_TABLET | ORAL | Status: DC | PRN

## 2012-10-04 MED ORDER — ADULT MULTIVITAMIN W/MINERALS CH
1.0000 | ORAL_TABLET | Freq: Every day | ORAL | Status: DC
  Filled 2012-10-04 (×3): qty 1

## 2012-10-04 MED ORDER — IRON 325 (65 FE) MG PO TABS: 1.0000 | ORAL_TABLET | Freq: Every day | ORAL | Status: DC

## 2012-10-04 MED ORDER — CARBIDOPA-LEVODOPA ER 50-200 MG PO TBCR
1.0000 | EXTENDED_RELEASE_TABLET | ORAL | Status: DC
  Administered 2012-10-04 – 2012-10-10 (×12): 1 via ORAL
  Filled 2012-10-04 (×13): qty 1

## 2012-10-04 MED ORDER — TRAMADOL HCL 50 MG PO TABS: 50.0000 mg | ORAL_TABLET | Freq: Four times a day (QID) | ORAL | Status: DC | PRN

## 2012-10-04 MED ORDER — PANTOPRAZOLE SODIUM 40 MG PO TBEC: 40.0000 mg | DELAYED_RELEASE_TABLET | Freq: Every day | ORAL | Status: DC

## 2012-10-04 MED ORDER — MYCOPHENOLATE MOFETIL 500 MG PO TABS: 500.0000 mg | ORAL_TABLET | Freq: Two times a day (BID) | ORAL | Status: DC

## 2012-10-04 MED ORDER — METOLAZONE 2.5 MG PO TABS: 2.5000 mg | ORAL_TABLET | Freq: Every day | ORAL | Status: DC

## 2012-10-04 MED ORDER — FOLIC ACID 0.5 MG HALF TAB
400.0000 ug | ORAL_TABLET | Freq: Every day | ORAL | Status: DC
  Administered 2012-10-04: 0.5 mg via ORAL
  Filled 2012-10-04 (×2): qty 1

## 2012-10-04 MED ORDER — TACROLIMUS 1 MG PO CAPS
5.0000 mg | ORAL_CAPSULE | Freq: Two times a day (BID) | ORAL | Status: DC
  Administered 2012-10-05 – 2012-10-11 (×13): 5 mg via ORAL
  Filled 2012-10-04 (×18): qty 5

## 2012-10-04 MED ORDER — CLOPIDOGREL BISULFATE 75 MG PO TABS
75.0000 mg | ORAL_TABLET | Freq: Every day | ORAL | Status: DC
  Administered 2012-10-06 – 2012-10-11 (×6): 75 mg via ORAL
  Filled 2012-10-04 (×10): qty 1

## 2012-10-04 MED ORDER — PYRIDOSTIGMINE BROMIDE 60 MG PO TABS
60.0000 mg | ORAL_TABLET | Freq: Two times a day (BID) | ORAL | Status: DC
  Filled 2012-10-04 (×3): qty 1

## 2012-10-04 MED ORDER — ENOXAPARIN SODIUM 40 MG/0.4ML ~~LOC~~ SOLN
40.0000 mg | SUBCUTANEOUS | Status: DC
  Administered 2012-10-04 – 2012-10-05 (×2): 40 mg via SUBCUTANEOUS
  Filled 2012-10-04 (×3): qty 0.4

## 2012-10-04 MED ORDER — ASPIRIN 81 MG PO CHEW
81.0000 mg | CHEWABLE_TABLET | Freq: Every day | ORAL | Status: DC
  Administered 2012-10-04: 81 mg via ORAL
  Filled 2012-10-04: qty 1

## 2012-10-04 MED ORDER — CARBIDOPA-LEVODOPA 25-100 MG PO TABS
1.0000 | ORAL_TABLET | Freq: Every day | ORAL | Status: DC
  Administered 2012-10-05 – 2012-10-10 (×7): 1 via ORAL
  Filled 2012-10-04 (×9): qty 1

## 2012-10-04 MED ORDER — TAMSULOSIN HCL 0.4 MG PO CAPS
0.8000 mg | ORAL_CAPSULE | Freq: Every day | ORAL | Status: DC
  Administered 2012-10-04 – 2012-10-11 (×8): 0.8 mg via ORAL
  Filled 2012-10-04 (×9): qty 2

## 2012-10-04 MED ORDER — LORAZEPAM 1 MG PO TABS: 1.0000 mg | ORAL_TABLET | Freq: Once | ORAL | Status: DC

## 2012-10-04 MED ORDER — ASPIRIN EC 81 MG PO TBEC
81.0000 mg | DELAYED_RELEASE_TABLET | Freq: Every day | ORAL | Status: DC
  Administered 2012-10-07 – 2012-10-11 (×5): 81 mg via ORAL
  Filled 2012-10-04 (×9): qty 1

## 2012-10-04 MED ORDER — IPRATROPIUM BROMIDE 0.02 % IN SOLN
0.5000 mg | Freq: Once | RESPIRATORY_TRACT | Status: AC
  Administered 2012-10-04: 0.5 mg via RESPIRATORY_TRACT
  Filled 2012-10-04: qty 2.5

## 2012-10-04 MED ORDER — LAMOTRIGINE 200 MG PO TABS
200.0000 mg | ORAL_TABLET | Freq: Every day | ORAL | Status: DC
  Administered 2012-10-05 – 2012-10-11 (×7): 200 mg via ORAL
  Filled 2012-10-04 (×8): qty 1

## 2012-10-04 MED ORDER — MORPHINE SULFATE 2 MG/ML IJ SOLN
2.0000 mg | INTRAMUSCULAR | Status: DC | PRN
  Administered 2012-10-05: 2 mg via INTRAVENOUS
  Filled 2012-10-04: qty 1

## 2012-10-04 MED ORDER — INSULIN ASPART 100 UNIT/ML ~~LOC~~ SOLN
0.0000 [IU] | SUBCUTANEOUS | Status: DC
  Administered 2012-10-04 – 2012-10-05 (×2): 3 [IU] via SUBCUTANEOUS

## 2012-10-04 MED ORDER — ALBUTEROL SULFATE (5 MG/ML) 0.5% IN NEBU
2.5000 mg | INHALATION_SOLUTION | RESPIRATORY_TRACT | Status: DC | PRN
  Administered 2012-10-09 – 2012-10-13 (×3): 2.5 mg via RESPIRATORY_TRACT
  Filled 2012-10-04 (×5): qty 0.5

## 2012-10-04 MED ORDER — SODIUM CHLORIDE 0.9 % IJ SOLN
10.0000 mL | Freq: Once | INTRAMUSCULAR | Status: AC
  Administered 2012-10-04: 10 mL via INTRAVENOUS

## 2012-10-04 MED ORDER — CARBIDOPA-LEVODOPA ER 50-200 MG PO TBCR
1.0000 | EXTENDED_RELEASE_TABLET | ORAL | Status: DC
  Filled 2012-10-04: qty 1

## 2012-10-04 MED ORDER — CALCIUM CARBONATE 1250 (500 CA) MG PO TABS
2.0000 | ORAL_TABLET | Freq: Every day | ORAL | Status: DC
  Administered 2012-10-07 – 2012-10-11 (×5): 1000 mg via ORAL
  Filled 2012-10-04 (×9): qty 2

## 2012-10-04 MED ORDER — MORPHINE SULFATE 2 MG/ML IJ SOLN
INTRAMUSCULAR | Status: AC
  Administered 2012-10-04: 2 mg via INTRAVENOUS
  Filled 2012-10-04: qty 1

## 2012-10-04 MED ORDER — ALBUTEROL SULFATE (5 MG/ML) 0.5% IN NEBU
2.5000 mg | INHALATION_SOLUTION | Freq: Once | RESPIRATORY_TRACT | Status: AC
  Administered 2012-10-04: 2.5 mg via RESPIRATORY_TRACT
  Filled 2012-10-04: qty 1

## 2012-10-04 MED ORDER — SODIUM CHLORIDE 0.9 % IV SOLN: 250.0000 mL | INTRAVENOUS | Status: DC | PRN

## 2012-10-04 NOTE — ED Provider Notes (Signed)
CSN: [id number]     Arrival date & time 10/04/12  1128 History     First MD Initiated Contact with Patient 10/04/12 1140     Chief Complaint  Patient presents with  . Shortness of Breath    HPI   PCP Dr. Sharene Butters, Pulmonary Dr. Christophe Louis, Cardiology Dr. Shirlee Latch.Significant cardiac history insignificant pulmonary history. COPD. Coronary artery disease. Coronary bypass grafting. Congestive heart failure. Recurrent right loculated pleural effusion status post thoracentesis and renal transplant. HEENT he described increasing shortness of breath for the last 10 days. Seen by pulmonary 2 days ago. Had increasing doses of Lasix given. Seen by nephrology yesterday. Nephrology concurred with increasing doses. He has good output. Short of breath somewhat at rest, marked  worsening with any activity. With a walker can barely get to his bathroom a few feet away without marked dyspnea. Take several minutes to recover.  Denies productive sputum. Denies fever. Same cough. No pain. No headache. Good urine output. Has had increased output with the Lasix. Increasing lower extremity edema. He denies orthopnea. Past Medical History  Diagnosis Date  . CAD (coronary artery disease)     a. myoview 1/11: lg inf fixed defect;   b. cath 1/11: 3v CAD,  c. s/p CABG 1/11 c/b intubation, acute renal failure requiring CVVH and AFib  . Atrial fibrillation     a. Post-op CABG.  . Chronic diastolic heart failure     a. echo 4/11: EF 45-50%, mod LVH, LAE, inf and septal HK  . Orthostatic hypotension     a. Shy-Drager syndrome with orthostatic hypotension: improvement with pyridostigmine.   Marland Kitchen History of renal transplant     a. On chronic immunosupp.  Marland Kitchen DM2 (diabetes mellitus, type 2)     a. H/o insulin pump.  . Diabetic gastroparesis   . HTN (hypertension)   . HLD (hyperlipidemia)   . PAD (peripheral artery disease)     a. atherectomy right SFA and right popliteal on 01/12/10. Complicated by left groin pseudoaneurysm. ABIs  (3/14) with 0.85 R, 0.76 L (stable).   . Prostate cancer     a. s/p seed implantation in 1/06.  . ED (erectile dysfunction)   . DDD (degenerative disc disease), lumbar   . Obesity   . Low testosterone   . Chronic kidney disease     s/p renal transplant 2002  . Pleural effusion, right     a. Loculated right>left pleural effusion: Right thoracentesis (6/12), cytology negative for malignancy.  b. Thoracentesis 09/15/2012  . COPD (chronic obstructive pulmonary disease)     Followed by Dr Delford Field, now on home oxygen.   Marland Kitchen Anxiety   . Movement disorder   . Abnormal ultrasound of carotid artery     a. Carotid dopplers (6/12): 0-39% bilateral ICA stenosis.   . Depression   . Heart block     a. Patient has had profound 1st degree AV block on ECG. Holter (11/12) showed NSR, 1st degree heart block, occasional short runs of type I 2nd degree heart block. Holter (3/14) showed type I 2nd degree AV block. b. Type 1 2nd degree AV block noted 09/2012 admission.  . Aortic stenosis     a. Moderate on 3/14 echo.   . Parkinson's disease    Past Surgical History  Procedure Laterality Date  . Coronary artery bypass graft  03/2009  . Cataract extraction    . Vitrectomy    . Tonsillectomy    . Kidney transplant  2002  . Hernia repair    .  Prostate cancer rx 2006 brachytherapy    . Prior peritoneal catheter placement    . Lumbar laminectomy/decompression microdiscectomy  05/16/2011    Procedure: LUMBAR LAMINECTOMY/DECOMPRESSION MICRODISCECTOMY 1 LEVEL;  Surgeon: Barnett Abu, MD;  Location: [facility];  Service: Neurosurgery;  Laterality: Bilateral;  Bilateral Lumbar Five-Sacral One Laminectomy   Family History  Problem Relation Age of Onset  . Coronary artery disease Mother   . Valvular heart disease Father   . Hepatitis      sibling  . Coronary artery disease Sister   . Heart attack Sister   . Tuberculosis Father    History  Substance Use Topics  . Smoking status: Former Smoker -- 2.00 packs/day  for 20 years    Types: Cigarettes    Quit date: 02/20/1978  . Smokeless tobacco: Never Used     Comment: smoked 1 ppd, stopped 1980  . Alcohol Use: No    Review of Systems  Constitutional: Negative for fever, chills, diaphoresis, appetite change and fatigue.  HENT: Negative for sore throat, mouth sores and trouble swallowing.   Eyes: Negative for visual disturbance.  Respiratory: Positive for shortness of breath. Negative for cough, chest tightness and wheezing.   Cardiovascular: Positive for leg swelling. Negative for chest pain.  Gastrointestinal: Negative for nausea, vomiting, abdominal pain, diarrhea and abdominal distention.  Endocrine: Negative for polydipsia, polyphagia and polyuria.  Genitourinary: Positive for frequency. Negative for dysuria, hematuria and decreased urine volume.  Musculoskeletal: Negative for gait problem.  Skin: Negative for color change, pallor and rash.  Neurological: Positive for weakness. Negative for dizziness, syncope, light-headedness and headaches.  Hematological: Does not bruise/bleed easily.  Psychiatric/Behavioral: Negative for behavioral problems and confusion.    Allergies  Review of patient's allergies indicates no known allergies.  Home Medications   Current Outpatient Rx  Name  Route  Sig  Dispense  Refill  . albuterol (PROVENTIL) (2.5 MG/3ML) 0.083% nebulizer solution   Nebulization   Take 2.5 mg by nebulization every 6 (six) hours as needed for wheezing.         Marland Kitchen aspirin 81 MG tablet   Oral   Take 81 mg by mouth daily.         Marland Kitchen atorvastatin (LIPITOR) 40 MG tablet   Oral   Take 40 mg by mouth daily.          . calcium carbonate (OS-CAL - DOSED IN MG OF ELEMENTAL CALCIUM) 1250 MG tablet   Oral   Take 2 tablets by mouth daily.         . carbidopa-levodopa (SINEMET CR) 50-200 MG per tablet   Oral   Take 0.5-1 tablets by mouth 2 (two) times daily. Take one-half tablet in the morning,One tablet at 11 am before  lunchtime,and 1 tablet at 5 pm before dinner.         . Cholecalciferol (VITAMIN D-3) 5000 UNITS TABS   Oral   Take 2 tablets by mouth 3 (three) times a week.         . clopidogrel (PLAVIX) 75 MG tablet   Oral   Take 75 mg by mouth daily.         . eszopiclone (LUNESTA) 2 MG TABS   Oral   Take 2 mg by mouth at bedtime. Take immediately before bedtime         . ezetimibe (ZETIA) 10 MG tablet   Oral   Take 10 mg by mouth daily.          Marland Kitchen  Ferrous Sulfate (IRON) 325 (65 FE) MG TABS   Oral   Take 1 tablet by mouth daily.          . folic acid (FOLVITE) 400 MCG tablet   Oral   Take 400 mcg by mouth daily.         . furosemide (LASIX) 40 MG tablet   Oral   Take 120 mg by mouth daily. Pt will be taking 120 mg.         . insulin aspart (NOVOLOG) 100 UNIT/ML injection   Continuous infusion (non-IV)   by Continuous infusion (non-IV) route. Administered via insulin pump         . isosorbide mononitrate (IMDUR) 60 MG 24 hr tablet   Oral   Take 60 mg by mouth daily.         Marland Kitchen lamoTRIgine (LAMICTAL) 200 MG tablet   Oral   Take 200 mg by mouth daily.         . Multiple Vitamin (MULITIVITAMIN WITH MINERALS) TABS   Oral   Take 1 tablet by mouth daily.         . mycophenolate (CELLCEPT) 500 MG tablet   Oral   Take 500 mg by mouth 2 (two) times daily.          . Nebulizers (COMPRESSOR/NEBULIZER) MISC      Use with albuterol         . pantoprazole (PROTONIX) 40 MG tablet   Oral   Take 40 mg by mouth daily.         . predniSONE (DELTASONE) 5 MG tablet   Oral   Take 5 mg by mouth daily. HOLD while on 10mg  prednisone then resume when 10mg  is completed         . pyridostigmine (MESTINON) 60 MG tablet   Oral   Take 60 mg by mouth 2 (two) times daily. Take 2 tablets by mouth in the morning, and take 1 tablet by mouth in the afternoon.         . tacrolimus (PROGRAF) 5 MG capsule   Oral   Take 5 mg by mouth 2 (two) times daily.          .  Tamsulosin HCl (FLOMAX) 0.4 MG CAPS   Oral   Take 0.8 mg by mouth daily.          . traMADol (ULTRAM) 50 MG tablet   Oral   Take 50 mg by mouth as needed for pain.          Marland Kitchen Umeclidinium-Vilanterol 62.5-25 MCG/INH AEPB   Inhalation   Inhale 1 puff into the lungs daily.         . Vilazodone HCl (VIIBRYD) 40 MG TABS   Oral   Take 20 mg by mouth daily.          . vitamin C (ASCORBIC ACID) 500 MG tablet   Oral   Take 1,000 mg by mouth daily.          Marland Kitchen albuterol (PROVENTIL HFA;VENTOLIN HFA) 108 (90 BASE) MCG/ACT inhaler   Inhalation   Inhale 2 puffs into the lungs every 6 (six) hours as needed for wheezing or shortness of breath.   1 Inhaler   4    BP 105/47  Pulse 119  Temp(Src) 98.4 F (36.9 C)  Resp 26  Wt 215 lb (97.523 kg)  BMI 27.98 kg/m2  SpO2 93% Physical Exam  Constitutional: He is oriented to person, place, and time. He appears  well-developed and well-nourished. He has a sickly appearance.  He appears somewhat cachectic. He is awake alert.  Dyspneic with pursed lip breathing at rest.  HENT:  Head: Normocephalic.  Eyes: Conjunctivae are normal. Pupils are equal, round, and reactive to light. No scleral icterus.  Neck: Normal range of motion. Neck supple. No JVD present. No thyromegaly present.  Cardiovascular: Normal rate and regular rhythm.  Exam reveals gallop and S3. Exam reveals no S4, no distant heart sounds and no friction rub.   No murmur heard.  No systolic murmur is present  No crackles.    Pulmonary/Chest: Accessory muscle usage present. Tachypnea noted. No respiratory distress. He has decreased breath sounds in the right middle field and the right lower field. He has no wheezes. He has no rhonchi. He has rales in the right upper field, the right middle field, the left upper field and the left middle field.  Abdominal: Soft. Bowel sounds are normal. He exhibits no distension. There is no tenderness. There is no rebound.  Musculoskeletal:  Normal range of motion.  Neurological: He is alert and oriented to person, place, and time.  Skin: Skin is warm and dry. No rash noted.  2+ LE edema.  Psychiatric: He has a normal mood and affect. His behavior is normal.    ED Course   Procedures (including critical care time)  Labs Reviewed  CBC - Abnormal; Notable for the following:    RBC 4.16 (*)    Hemoglobin 10.7 (*)    HCT 33.1 (*)    MCH 25.7 (*)    RDW 17.7 (*)    Platelets 126 (*)    All other components within normal limits  BASIC METABOLIC PANEL - Abnormal; Notable for the following:    BUN 44 (*)    Creatinine, Ser 1.39 (*)    GFR calc non Af Amer 49 (*)    GFR calc Af Amer 57 (*)    All other components within normal limits  PRO B NATRIURETIC PEPTIDE - Abnormal; Notable for the following:    Pro B Natriuretic peptide (BNP) 6763.0 (*)    All other components within normal limits  URINALYSIS, ROUTINE W REFLEX MICROSCOPIC - Abnormal; Notable for the following:    APPearance CLOUDY (*)    Hgb urine dipstick SMALL (*)    Nitrite POSITIVE (*)    Leukocytes, UA SMALL (*)    All other components within normal limits  URINE MICROSCOPIC-ADD ON - Abnormal; Notable for the following:    Squamous Epithelial / LPF FEW (*)    Bacteria, UA FEW (*)    Casts HYALINE CASTS (*)    All other components within normal limits  URINE CULTURE  POCT I-STAT TROPONIN I    EKG rhythm determination fibrillation versus third-degree AV block. No ST changes.  Repeat EKG indication change in heart rate. EKG shows third-degree AV block. ST changes. Note patient does have pre-or prior EKG showing atrial ablation and prior EKG showing a second second-degree type II AV block Dg Chest 2 View  10/02/2012   *RADIOLOGY REPORT*  Clinical Data: Cough, shortness of breath  CHEST - 2 VIEW  Comparison: 09/20/2012  Findings: Hazy opacity overlying the right mid/lower lung likely reflects a moderate layering/loculated right pleural effusion.  Suspected  small left pleural effusion/pleural thickening.  These findings are unchanged.  Cardiomegaly. Postsurgical changes related to prior CABG.  Visualized osseous structures are within normal limits.  IMPRESSION: Moderate layering/loculated right pleural effusion.  Small left pleural  effusion/pleural thickening.  When compared the prior study, these findings are unchanged.   Original Report Authenticated By: Charline Bills, M.D.   Dg Chest Port 1 View  10/04/2012   *RADIOLOGY REPORT*  Clinical Data: Shortness of breath.  PORTABLE CHEST - 1 VIEW  Comparison: 10/02/2012.  Findings: Diffuse slightly asymmetric air space disease similar to the recent examination.  Question pulmonary edema versus infectious infiltrate.  Cardiomegaly post CABG.  Calcified aorta.  IMPRESSION: Diffuse slightly asymmetric air space disease similar to the recent examination.  Question pulmonary edema versus infectious infiltrate. Recommend follow-up until clearance.  Cardiomegaly post CABG.  Calcified aorta.   Original Report Authenticated By: Lacy Duverney, M.D.   1. CHF (congestive heart failure)     MDM  I think his dyspnea is very likely multifactorial. He is not wheezing. Every does have a history of COPD. He states he has been some mild relief with use of his nebulizer albuterol at home. His increasing edema which may suggest CHF.  I hear an S3 gallup. He does not have orthopnea. This may be related to increasing effusion. Chest x-ray is pending. He has had optimization of his outpatient treatment with steroids. He is on increasing doses of Lasix. He may likely require hospitalization for more aggressive and IV treatment, and possible ventilatory support.  He is at least 2-300. He is placed on BiPAP for one hour he improved presumably been following this. His back on 5 L nasal cannula. [facility]. He'll follow the patient in consultation. Discussed this with Dr. Lyda Jester of internal medicine. Plan, admit, diuresis.  Claudean Kinds, MD 10/04/12 [phone]

## 2012-10-04 NOTE — ED Notes (Signed)
Receiving RN is requesting RR RN to evaluate pt before accepting pt

## 2012-10-04 NOTE — Consult Note (Signed)
History and Physical  Patient ID: John [name]. MRN: [id number], DOB: 16-Nov-1940 Date of Encounter: 10/04/2012, 2:40 PM Primary Physician: Hoyle Sauer, MD Primary Cardiologist: Dr. Shirlee Latch Primary Pulmonologist: Dr. Shirlee Latch Primary Nephrologist: Dr. Abel Presto Primary Neurologist: Dr. 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  With regard to history, he had a long and complicated hospital admission in 1/11 when was admitted with pulm edema/NSTEMI. Stress test was abnormal so he went on to have cath showing severe disease involving the LM, LAD, ramus, CFX and RCA. He underwent CABG x 5. Post-op course was complicated by pulmonary edema requiring intubation, renal failure requiring CVVH, and atrial fibrillation. He was in the hospital about 7 weeks. In 11/11, patient had right SFA and popliteal atherectomy that was complicated by a left groin pseudoaneurysm. In the fall of 2012, John [name] reported worsening orthostatic-type lightheadedness and worsening exertional dyspnea. Dr. Shirlee Latch decided at that point to take him for right and left heart cath in 10/12. On Lasix, his right and left heart filling pressures were not significantly elevated. His bypass grafts were patent but he had an ungrafted PDA with 95% stenosis (small vessel that would not be amenable to PCI). As this was a possible source of  ischemia and could potentially cause exertional dyspnea (his anginal equivalent was dyspnea in the past), Dr. Shirlee Latch started him on Imdur and took him off lisinopril due to worsening orthostatic symptoms. He has been on pyridostigmine for orthostatic hypotension with supine hypertension. In 3/13, John [name] had back surgery. Post-operative course was complicated by fluid retention and respiratory failure requiring intubation. He developed PNA and delirium. He had a prolonged hospitalization and rehabilitation. He has been having difficulty walking due to poor balance and right foot drop. He has seen neurology and was diagnosed with Parkinson's. He is wearing a brace which has helped with the foot drop. Last echo in 3/14 showed normal EF, moderate LVH, and moderate aortic stenosis.   More recently, the patient underwent R thoracentesis 09/15/12 for loculated pleural effusion. He was also recently completed a course of Augmentin and prednisone taper for dyspnea. Over the past 10 days, however, he has developed significant DOE, dry cough, LEE, and increase in abdominal girth. Decreased UOP per wife - pt hasn't noticed. He was evaluated in the pulmonary office 2 days ago at which time Lasix was increased to 80mg  BID. LE dopplers were negative for DVT. He saw Dr. Abel Presto yesterday and K was discontinued due to level 5.7, prednisone was decreased from 10->5mg , and Lasix was increased to 120mg  BID. He took 2 doses of that yesterday and 1 dose this AM, but was still significantly SOB so his wife drove him to the ER. He was hypoxic at 87% on [place] thus was placed on bipap. He has been intermittently bradycardic down to the mid 30's but is currently maintaining 40s-60s. He is in Wenckebach. At times when dropping slower he has a 2:1 pattern but per discussion with Dr. Graciela Husbands with EP, this  is likely Wenckebach as well given surrounding rhythm. He is still mentating well and BP is stable. Please note in his chart the vitals  show HR in 120's but this is an error. He has not been tachycardic this admission. pBNP 6763, troponin neg x 1, BUN/Cr 44/1.39, K 5.1, Hgb 10.7/plt 126. CXR shows diffuse slightly asymmetric air space disease similar to the recent examination, question pulmonary edema versus infectious infiltrate, cardiomegaly post CABG, calcified aorta. (CXR on 8/13 was read as moderate layering/loculated right pleural effusion.) He denies any fever, chills, orthopnea, CP, palpitations, or syncope. Difficult to weigh regularly at home due to balance, but weight is stable today compared to last in 08/2012 and down from May/June. He received 80mg  IV Lasix and atrovent/proventil neb in the ER and is beginning to have good UOP.  Past Medical History  Diagnosis Date  . CAD (coronary artery disease)     a. myoview 1/11: lg inf fixed defect;   b. cath 1/11: 3v CAD,  c. s/p CABG 1/11 c/b intubation, acute renal failure requiring CVVH and AFib  . Atrial fibrillation     a. Post-op CABG.  . Chronic diastolic heart failure     a. echo 4/11: EF 45-50%, mod LVH, LAE, inf and septal HK  . Orthostatic hypotension     a. Shy-Drager syndrome with orthostatic hypotension: improvement with pyridostigmine.   Marland Kitchen History of renal transplant     a. On chronic immunosupp.  Marland Kitchen DM2 (diabetes mellitus, type 2)     a. H/o insulin pump.  . Diabetic gastroparesis   . HTN (hypertension)   . HLD (hyperlipidemia)   . PAD (peripheral artery disease)     a. atherectomy right SFA and right popliteal on 01/12/10. Complicated by left groin pseudoaneurysm. ABIs (3/14) with 0.85 R, 0.76 L (stable).   . Prostate cancer     a. s/p seed implantation in 1/06.  . ED (erectile dysfunction)   . DDD (degenerative disc disease), lumbar   . Tremor   . Obesity   . Low testosterone   . Chronic kidney disease     s/p renal transplant 2002  . Pleural effusion, right     a. Loculated right>left pleural effusion: Right thoracentesis (6/12), cytology negative  for malignancy.  b. Thoracentesis 09/15/2012  . COPD (chronic obstructive pulmonary disease)     Followed by Dr Delford Field, now on home oxygen.   Marland Kitchen Anxiety   . Movement disorder   . Abnormal ultrasound of carotid artery     a. Carotid dopplers (6/12): 0-39% bilateral ICA stenosis.   . Depression   . Heart block     a. Patient has had profound 1st degree AV block on ECG. Holter (11/12) showed NSR, 1st degree heart block, occasional short runs of type I 2nd degree heart block. Holter (3/14) showed type I 2nd degree AV block.   . Aortic stenosis     a. Moderate on 3/14 echo.   . Parkinson's disease      Most Recent Cardiac Studies: 2D Echo 05/20/12 - Left ventricle: The cavity size was normal. Wall thickness was increased in a pattern of moderate LVH. Systolic function was normal. The estimated ejection fraction was in the range of 55% to 60%. There is hypokinesis of the inferior myocardium. The study is not technically sufficient to allow evaluation of LV diastolic function. - Aortic valve: Valve mobility was restricted. There was moderate stenosis. - Mitral valve: Mild regurgitation. - Left atrium: The atrium was moderately  dilated. - Right ventricle: The cavity size was mildly dilated. - Right atrium: The atrium was mildly dilated. - Pulmonary arteries: Systolic pressure was mildly increased. PA peak pressure: 32mm Hg (S).   Surgical History:  Past Surgical History  Procedure Laterality Date  . Coronary artery bypass graft  03/2009  . Cataract extraction    . Vitrectomy    . Tonsillectomy    . Kidney transplant  2002  . Hernia repair    . Prostate cancer rx 2006 brachytherapy    . Prior peritoneal catheter placement    . Lumbar laminectomy/decompression microdiscectomy  05/16/2011    Procedure: LUMBAR LAMINECTOMY/DECOMPRESSION MICRODISCECTOMY 1 LEVEL;  Surgeon: Barnett Abu, MD;  Location: [facility];  Service: Neurosurgery;  Laterality: Bilateral;  Bilateral Lumbar Five-Sacral  One Laminectomy     Home Meds: Prior to Admission medications   Medication Sig Start Date End Date Taking? Authorizing Provider  albuterol (PROVENTIL) (2.5 MG/3ML) 0.083% nebulizer solution Take 2.5 mg by nebulization every 6 (six) hours as needed for wheezing. 09/11/12  Yes Storm Frisk, MD  aspirin 81 MG tablet Take 81 mg by mouth daily.   Yes Historical Provider, MD  atorvastatin (LIPITOR) 40 MG tablet Take 40 mg by mouth daily.    Yes Historical Provider, MD  calcium carbonate (OS-CAL - DOSED IN MG OF ELEMENTAL CALCIUM) 1250 MG tablet Take 2 tablets by mouth daily.   Yes Historical Provider, MD  carbidopa-levodopa (SINEMET CR) 50-200 MG per tablet Take 0.5-1 tablets by mouth 2 (two) times daily. Take one-half tablet in the morning,One tablet at 11 am before lunchtime,and 1 tablet at 5 pm before dinner.   Yes Historical Provider, MD  Cholecalciferol (VITAMIN D-3) 5000 UNITS TABS Take 2 tablets by mouth 3 (three) times a week.   Yes Historical Provider, MD  clopidogrel (PLAVIX) 75 MG tablet Take 75 mg by mouth daily.   Yes Historical Provider, MD  eszopiclone (LUNESTA) 2 MG TABS Take 2 mg by mouth at bedtime. Take immediately before bedtime   Yes Historical Provider, MD  ezetimibe (ZETIA) 10 MG tablet Take 10 mg by mouth daily.    Yes Historical Provider, MD  Ferrous Sulfate (IRON) 325 (65 FE) MG TABS Take 1 tablet by mouth daily.    Yes Historical Provider, MD  folic acid (FOLVITE) 400 MCG tablet Take 400 mcg by mouth daily.   Yes Historical Provider, MD  furosemide (LASIX) 40 MG tablet Take 120 mg by mouth daily. Pt will be taking 120 mg. 05/29/12  Yes Laurey Morale, MD  insulin aspart (NOVOLOG) 100 UNIT/ML injection by Continuous infusion (non-IV) route. Administered via insulin pump   Yes Historical Provider, MD  isosorbide mononitrate (IMDUR) 60 MG 24 hr tablet Take 60 mg by mouth daily. 03/01/12  Yes Laurey Morale, MD  lamoTRIgine (LAMICTAL) 200 MG tablet Take 200 mg by mouth daily.    Yes Historical Provider, MD  Multiple Vitamin (MULITIVITAMIN WITH MINERALS) TABS Take 1 tablet by mouth daily.   Yes Historical Provider, MD  mycophenolate (CELLCEPT) 500 MG tablet Take 500 mg by mouth 2 (two) times daily.    Yes Historical Provider, MD  Nebulizers (COMPRESSOR/NEBULIZER) MISC Use with albuterol 09/11/12  Yes Storm Frisk, MD  pantoprazole (PROTONIX) 40 MG tablet Take 40 mg by mouth daily. 08/09/11  Yes Storm Frisk, MD  predniSONE (DELTASONE) 5 MG tablet Take 5 mg by mouth daily. HOLD while on 10mg  prednisone then resume when 10mg  is completed 09/11/12  Yes Storm Frisk, MD  pyridostigmine (MESTINON) 60 MG tablet Take 60 mg by mouth 2 (two) times daily. Take 2 tablets by mouth in the morning, and take 1 tablet by mouth in the afternoon.   Yes Historical Provider, MD  tacrolimus (PROGRAF) 5 MG capsule Take 5 mg by mouth 2 (two) times daily.    Yes Historical Provider, MD  Tamsulosin HCl (FLOMAX) 0.4 MG CAPS Take 0.8 mg by mouth daily.    Yes Historical Provider, MD  traMADol (ULTRAM) 50 MG tablet Take 50 mg by mouth as needed for pain.  03/02/12  Yes Historical Provider, MD  Umeclidinium-Vilanterol 62.5-25 MCG/INH AEPB Inhale 1 puff into the lungs daily. 08/13/12  Yes Storm Frisk, MD  Vilazodone HCl (VIIBRYD) 40 MG TABS Take 20 mg by mouth daily.    Yes Historical Provider, MD  vitamin C (ASCORBIC ACID) 500 MG tablet Take 1,000 mg by mouth daily.    Yes Historical Provider, MD  albuterol (PROVENTIL HFA;VENTOLIN HFA) 108 (90 BASE) MCG/ACT inhaler Inhale 2 puffs into the lungs every 6 (six) hours as needed for wheezing or shortness of breath. 10/27/11   Storm Frisk, MD    Allergies: No Known Allergies  History   Social History  . Marital Status: Married    Spouse Name: N/A    Number of Children: 3  . Years of Education: N/A   Occupational History  . retired Clinical research associate    Social History Main Topics  . Smoking status: Former Smoker -- 2.00 packs/day for 20 years     Types: Cigarettes    Quit date: 02/20/1978  . Smokeless tobacco: Never Used     Comment: smoked 1 ppd, stopped 1980  . Alcohol Use: No  . Drug Use: No  . Sexual Activity: Not on file   Other Topics Concern  . Not on file   Social History Narrative  . No narrative on file     Family History  Problem Relation Age of Onset  . Coronary artery disease Mother   . Valvular heart disease Father   . Hepatitis      sibling  . Coronary artery disease Sister   . Heart attack Sister   . Tuberculosis Father     Review of Systems: General: negative for chills, fever, night sweats Cardiovascular: see above Dermatological: negative for rash Respiratory: see above Urologic: negative for hematuria Abdominal: negative for nausea, vomiting, diarrhea, bright red blood per rectum, melena, or hematemesis Neurologic: negative for visual changes, syncope. See above All other systems reviewed and are otherwise negative except as noted above.  Labs:   Lab Results  Component Value Date   WBC 8.4 10/04/2012   HGB 10.7* 10/04/2012   HCT 33.1* 10/04/2012   MCV 79.6 10/04/2012   PLT 126* 10/04/2012     Recent Labs Lab 10/04/12 1146  NA 136  K 5.1  CL 104  CO2 21  BUN 44*  CREATININE 1.39*  CALCIUM 9.4  GLUCOSE 92    Lab Results  Component Value Date   CHOL 90 05/20/2012   HDL 40.10 05/20/2012   LDLCALC 40 05/20/2012   TRIG 49.0 05/20/2012    Radiology/Studies:   Dg Chest 2 View 10/02/2012 as outpatient   *RADIOLOGY REPORT*  Clinical Data: Cough, shortness of breath  CHEST - 2 VIEW  Comparison: 09/20/2012  Findings: Hazy opacity overlying the right mid/lower lung likely reflects a moderate layering/loculated right pleural effusion.  Suspected small left pleural effusion/pleural thickening.  These findings are unchanged.  Cardiomegaly. Postsurgical changes related to prior CABG.  Visualized osseous structures are within normal limits.  IMPRESSION: Moderate layering/loculated right  pleural effusion.  Small left pleural effusion/pleural thickening.  When compared the prior study, these findings are unchanged.   Original Report Authenticated By: Charline Bills, M.D.   Dg Chest Port 1 View ----------this admission-------- 10/04/2012   *RADIOLOGY REPORT*  Clinical Data: Shortness of breath.  PORTABLE CHEST - 1 VIEW  Comparison: 10/02/2012.  Findings: Diffuse slightly asymmetric air space disease similar to the recent examination.  Question pulmonary edema versus infectious infiltrate.  Cardiomegaly post CABG.  Calcified aorta.  IMPRESSION: Diffuse slightly asymmetric air space disease similar to the recent examination.  Question pulmonary edema versus infectious infiltrate. Recommend follow-up until clearance.  Cardiomegaly post CABG.  Calcified aorta.   Original Report Authenticated By: Lacy Duverney, M.D.   US Thoracentesis Asp Pleural Space W/img Guide  09/15/2012   *RADIOLOGY REPORT*  Clinical Data:  Patient with history of prior CABG, COPD, right pleural effusion, dyspnea.  Request is made for therapeutic right thoracentesis.  ULTRASOUND GUIDED  THERAPEUTIC RIGHT THORACENTESIS  An ultrasound guided thoracentesis was thoroughly discussed with the patient and questions answered.  The benefits, risks, alternatives and complications were also discussed.  The patient understands and wishes to proceed with the procedure.  Written consent was obtained.  Ultrasound was performed to localize and mark an adequate pocket of fluid in the right chest.  The area was then prepped and draped in the normal sterile fashion.  1% Lidocaine was used for local anesthesia.  Under ultrasound guidance a 19 gauge Yueh catheter was introduced.  Thoracentesis was performed.  The catheter was removed and a dressing applied.  Complications:  none  Findings: A total of approximately 150 cc's of blood tinged serous fluid was removed. Due to the multiloculated nature of the collection and despite catheter manipulation,  only the above amount of fluid could be aspirated at this time.  IMPRESSION: Successful ultrasound guided therapeutic right thoracentesis yielding 150 cc's of pleural fluid. Due to the multiloculated nature of the collection and small amount of fluid removed follow- up CT chest may be helpful for further characterization.  Read by: Jeananne Rama, P.A.-C   Original Report Authenticated By: Irish Lack, M.D.    EKG: initially reads out as afib but upon further examination appears to be Type 1 second degree HB F/u ECG demonstrates this better: HR 42, type 1 second degree HGB, NSIVCD, nonspecific ST-T changes  Physical ExamBlood pressure 120/63, pulse 52, temperature 98.4 F (36.9 C), resp. rate 28, weight 215 lb (97.523 kg), SpO2 100.00%. General: Well developed chronically ill appearing elderly WM n no acute distress. Head: Normocephalic, atraumatic, sclera non-icteric, no xanthomas, nares are without discharge.  Neck: JVD moderately elevated. Lungs: Diminished BS at bases R>L. Otherwise poor air movement throughout. No wheezing or rhonchi. On bipap.  Heart: RRR with S1 S2. 2/6 SEM in RUSB Abdomen: Soft, rounded with normoactive bowel sounds.  Msk:  Strength and tone appear normal for age. Extremities: No clubbing or cyanosis. 2+ pitting edema up beyond knees.  Distal pedal pulses in tact. Neuro: Alert and oriented X 3. No focal deficit. No facial asymmetry. Moves all extremities spontaneously. Psych:  Responds to questions appropriately with a normal affect.    ASSESSMENT AND PLAN:  1. Acute on chronic respiratory failure, likely multifactorial 2. Recurrent R loculated pleural effusion s/p recent thoracentesis 08/2012 3. Acute on chronic diastolic CHF 4.  COPD on home O2, chronic prednisone 5. Acute on chronic renal insufficiency with history of renal transplantation, on chronic immunosuppressives 6. 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 Recent hyperkalemia  8. Parkison's disease 9. Shy-Drager syndrome/orthostatic hypotension 10. CAD s/p CABG 11. Moderate aortic stenosis 12. Diabetes mellitus with insulin pump 13. Anemia  Agree with need for diuresis, but dosing would be best guided by nephrology given his renal transplant status. He is not on ACEI/ARB due to history of hypotension as well as recent hyperkalemia and orthostatic hypotension. Avoid BB given Type I 2nd degree heart block. Discussed with Dr. Graciela Husbands with EP - the patient is not on AV nodal blocking agents, but we have identified that pyridostigmine can precipitate bradycardia thus would recommend to hold this for now. He does not recommend any acute intervention for now but the patient will need to be monitored closely. This may be a chronic issue or precipitated by his underlying illness. Would recommend to check TSH and cycle enzymes. Keep atropine at bedside, pacer pads on patient just in case he becomes acutely symptomatic with this. Would recommend pulm to see patient given recurrent pleural effusion and COPD on chronic steroid therapy. Repeat echo. Will follow with you.  Signed, Ronie Spies PA-C 10/04/2012, 2:40 PM Patient seen and examined. I agree with the assessment and plan as detailed above. See also my additional thoughts below.   The patient is seen and examined. He is volume overloaded. He is beginning to diuresis. I have reviewed his extensive history. I have reviewed all aspects of the complete note that has been created above by Ronie Spies PA-C. This is an expert summary. The patient has a multitude of medical problems. He has good left ventricular function. He does have episodes of diastolic heart failure. However these episodes are dependent on a multitude of other medical problems. He has recurring loculated effusions. He has recurring dosing of steroids. He has had a renal transplant. I agree with diuresis. I feel strongly that this  needs to be overseen by his nephrology team because of his complex renal history. Diuresis is clearly indicated from the cardiac viewpoint. The patient has moderate aortic stenosis. This certainly plays some role. However it is not severe. There is no further workup concerning this issue at this time. In addition the patient has a history type 1, second degree AV block. He has this today and there is relative bradycardia.. Pyridostigmine may be playing a role with worsened bradycardia. This should be stopped if possible. We will watch his rhythm. I do not feel that this is playing a significant role with his volume overload at this time.  The overall cardiac plan will be to watch his rhythm. I agree with diuresis.  Willa Rough, MD, [facility] 10/04/2012 4:19 PM

## 2012-10-04 NOTE — ED Notes (Signed)
Respiratory at bedside to set up Bi-pap

## 2012-10-04 NOTE — H&P (Addendum)
[facility] History and Physical  Alvera Novel. NWG:[id number] DOB: December 08, 1940 DOA: 10/04/2012  Referring physician:  PCP: Hoyle Sauer, MD  Specialists:   Chief Complaint: Acute respiratory failure  HPI: Rafiel Mecca. is a 72 y.o. male 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  With regard to history, he had a long and complicated hospital admission in 1/11 when was admitted with pulm edema/NSTEMI. Stress test was abnormal so he went on to have cath showing severe disease involving the LM, LAD, ramus, CFX and RCA. He underwent CABG x 5. Post-op course was complicated by pulmonary edema requiring intubation, renal failure requiring CVVH, and atrial fibrillation. He was in the hospital about 7 weeks. In 11/11, patient had right SFA and popliteal atherectomy that was complicated by a left groin pseudoaneurysm. In the fall of 2012, Mr Suski reported worsening orthostatic-type lightheadedness and worsening exertional dyspnea. Dr. Shirlee Latch decided at that point to take him for right and left heart cath in 10/12. On Lasix, his right and left heart filling pressures were not significantly elevated. His bypass grafts were patent but he had an ungrafted PDA with 95% stenosis (small vessel that would not be amenable to PCI). As this was a possible source of ischemia and could potentially cause exertional dyspnea (his anginal equivalent was dyspnea in the past), Dr.  Shirlee Latch started him on Imdur and took him off lisinopril due to worsening orthostatic symptoms. He has been on pyridostigmine for orthostatic hypotension with supine hypertension. In 3/13, Mr Deeb had back surgery. Post-operative course was complicated by fluid retention and respiratory failure requiring intubation. He developed PNA and delirium. He had a prolonged hospitalization and rehabilitation. He has been having difficulty walking due to poor balance and right foot drop. He has seen neurology and was diagnosed with Parkinson's. He is wearing a brace which has helped with the foot drop. Last echo in 3/14 showed normal EF, moderate LVH, and moderate aortic stenosis.  More recently, the patient underwent R thoracentesis 09/15/12 for loculated pleural effusion. He was also recently completed a course of Augmentin and prednisone taper for dyspnea. Over the past 10 days, however, he has developed significant DOE, dry cough, LEE, and increase in abdominal girth. Decreased UOP per wife - pt hasn't noticed. He was evaluated in the pulmonary office 2 days ago at which time Lasix was increased to 80mg  BID. LE dopplers were negative for DVT. He saw Dr. Abel Presto yesterday and K was discontinued due to level 5.7, prednisone was decreased from 10->5mg , and Lasix was increased to 120mg  BID. He took 2 doses of that yesterday and 1 dose this AM, but was still significantly SOB so his wife drove him to the ER. He was hypoxic at 87% on [place] thus was placed on bipap. He has been intermittently bradycardic down to the mid 30's but is currently maintaining 40s-60s. He is in [place]. At times when dropping slower he has a 2:1 pattern but per discussion with Dr. Graciela Husbands with EP, this is likely [place] as well given surrounding rhythm. He is  still mentating well and BP is stable. Please note in his chart the vitals show HR in 120's but this is an error. He has not been tachycardic this admission. pBNP 6763, troponin neg x 1,  BUN/Cr 44/1.39, K 5.1, Hgb 10.7/plt 126. CXR shows diffuse slightly asymmetric air space disease similar to the recent examination, question pulmonary edema versus infectious infiltrate, cardiomegaly post CABG, calcified aorta. (CXR on 8/13 was read as moderate layering/loculated right pleural effusion.) He denies any fever, chills, orthopnea, CP, palpitations, or syncope. Difficult to weigh regularly at home due to balance, but weight is stable today compared to last in 08/2012 and down from May/June. He received 80mg  IV Lasix and atrovent/proventil neb in the ER and is beginning to have good UOP. Patient seen by Willa Rough, MD,([facility]) in the ED. Patient initially was admitted to 4 E. rapid response team was called when patient into respiratory distress and patient was transferred to 2900. Currently patient still feeling short of breath while on BiPAP, hypertensive, respiration rate in the high 20s -30s. Most of this history was obtained from the wife was at his bedside     Procedure CXR 10/04/2012   Diffuse slightly asymmetric air space disease similar to the recent  examination. Question pulmonary edema versus infectious  infiltrate. Recommend follow-up until clearance.  Cardiomegaly post CABG.  Calcified aorta.    Review of Systems: The patient denies anorexia, fever, weight loss,, vision loss, decreased hearing, hoarseness, chest pain, syncope, balance deficits, hemoptysis, abdominal pain, melena, hematochezia, severe indigestion/heartburn, hematuria, incontinence, genital sores, muscle weakness, suspicious skin lesions, transient blindness, difficulty walking, depression, unusual weight change, abnormal bleeding, enlarged lymph nodes, angioedema, and breast masses.    Past Medical History  Diagnosis Date  . CAD (coronary artery disease)     a. myoview 1/11: lg inf fixed defect;   b. cath 1/11: 3v CAD,  c. s/p CABG 1/11 c/b intubation, acute renal failure requiring CVVH and  AFib  . Atrial fibrillation     a. Post-op CABG.  . Chronic diastolic heart failure     a. echo 4/11: EF 45-50%, mod LVH, LAE, inf and septal HK  . Orthostatic hypotension     a. Shy-Drager syndrome with orthostatic hypotension: improvement with pyridostigmine.   Marland Kitchen History of renal transplant     a. On chronic immunosupp.  Marland Kitchen HTN (hypertension)   . HLD (hyperlipidemia)   . PAD (peripheral artery disease)     a. atherectomy right SFA and right popliteal on 01/12/10. Complicated by left groin pseudoaneurysm. ABIs (3/14) with 0.85 R, 0.76 L (stable).   . ED (erectile dysfunction)   . DDD (degenerative disc disease), lumbar   . Obesity   . Low testosterone   . Pleural effusion, right     a. Loculated right>left pleural effusion: Right thoracentesis (6/12), cytology negative for malignancy.  b. Thoracentesis 09/15/2012  . COPD (chronic obstructive pulmonary disease)     Followed by Dr Delford Field, now on home oxygen.   Marland Kitchen Anxiety   . Movement disorder   . Abnormal ultrasound of carotid artery     a. Carotid dopplers (6/12): 0-39% bilateral ICA stenosis.   . Depression   . Heart block     a. Patient has had profound 1st degree AV block on ECG. Holter (11/12) showed NSR, 1st degree heart block, occasional short runs of type I 2nd degree heart block. Holter (3/14) showed type I 2nd degree AV block. b. Type 1 2nd degree AV block noted  09/2012 admission.  . Aortic stenosis     a. Moderate on 3/14 echo.   . Parkinson's disease   . Complication of anesthesia     "respiratory crisis after heart and lumbar ORs; I can not have general anesthesia" (10/04/2012)  . CHF (congestive heart failure)   . Exertional shortness of breath   . On home oxygen therapy     "4L pretty much 24/7" (10/04/2012)  . DM2 (diabetes mellitus, type 2)     a. H/o insulin pump.  . Diabetic gastroparesis   . Iron deficiency anemia     "has had a couple infusions recently" (10/04/2012)  . History of blood transfusion     "several  over a large # of years" (10/04/2012)  . Chronic kidney disease     s/p renal transplant 2002  . Prostate cancer     a. s/p seed implantation in 1/06.  . Skin cancer     "on my face" (10/04/2012)   Past Surgical History  Procedure Laterality Date  . Cataract extraction w/ intraocular lens  implant, bilateral Bilateral   . Vitrectomy Left   . Tonsillectomy    . Kidney transplant  08/21/2000  . Radioactive seed implant  2006    "brachytherapy for prostate" (10/04/2012)  . Lumbar laminectomy/decompression microdiscectomy  05/16/2011    Procedure: LUMBAR LAMINECTOMY/DECOMPRESSION MICRODISCECTOMY 1 LEVEL;  Surgeon: Barnett Abu, MD;  Location: [facility];  Service: Neurosurgery;  Laterality: Bilateral;  Bilateral Lumbar Five-Sacral One Laminectomy  . Appendectomy    . Inguinal hernia repair    . Coronary artery bypass graft  03/2009    CABG "X4 or 5" (10/04/2012)  . Cardiac catheterization  03/2009  . Peritoneal catheter insertion  03/2000  . Peritoneal catheter removal  08/21/2000  . Mohs surgery      "couple places on face" (8/15//2014)   Social History:  reports that he quit smoking about 34 years ago. His smoking use included Cigarettes. He has a 50 pack-year smoking history. He has never used smokeless tobacco. He reports that he does not drink alcohol or use illicit drugs.   No Known Allergies  Family History  Problem Relation Age of Onset  . Coronary artery disease Mother   . Valvular heart disease Father   . Hepatitis      sibling  . Coronary artery disease Sister   . Heart attack Sister   . Tuberculosis Father      Prior to Admission medications   Medication Sig Start Date End Date Taking? Authorizing Provider  albuterol (PROVENTIL) (2.5 MG/3ML) 0.083% nebulizer solution Take 2.5 mg by nebulization every 6 (six) hours as needed for wheezing. 09/11/12  Yes Storm Frisk, MD  aspirin 81 MG tablet Take 81 mg by mouth daily.   Yes Historical Provider, MD  atorvastatin  (LIPITOR) 40 MG tablet Take 40 mg by mouth daily.    Yes Historical Provider, MD  calcium carbonate (OS-CAL - DOSED IN MG OF ELEMENTAL CALCIUM) 1250 MG tablet Take 2 tablets by mouth daily.   Yes Historical Provider, MD  carbidopa-levodopa (SINEMET CR) 50-200 MG per tablet Take 0.5-1 tablets by mouth 2 (two) times daily. Take one-half tablet in the morning,One tablet at 11 am before lunchtime,and 1 tablet at 5 pm before dinner.   Yes Historical Provider, MD  Cholecalciferol (VITAMIN D-3) 5000 UNITS TABS Take 2 tablets by mouth 3 (three) times a week.   Yes Historical Provider, MD  clopidogrel (PLAVIX) 75 MG tablet Take 75  mg by mouth daily.   Yes Historical Provider, MD  eszopiclone (LUNESTA) 2 MG TABS Take 2 mg by mouth at bedtime. Take immediately before bedtime   Yes Historical Provider, MD  ezetimibe (ZETIA) 10 MG tablet Take 10 mg by mouth daily.    Yes Historical Provider, MD  Ferrous Sulfate (IRON) 325 (65 FE) MG TABS Take 1 tablet by mouth daily.    Yes Historical Provider, MD  folic acid (FOLVITE) 400 MCG tablet Take 400 mcg by mouth daily.   Yes Historical Provider, MD  furosemide (LASIX) 40 MG tablet Take 120 mg by mouth daily. Pt will be taking 120 mg. 05/29/12  Yes Laurey Morale, MD  insulin aspart (NOVOLOG) 100 UNIT/ML injection by Continuous infusion (non-IV) route. Administered via insulin pump   Yes Historical Provider, MD  isosorbide mononitrate (IMDUR) 60 MG 24 hr tablet Take 60 mg by mouth daily. 03/01/12  Yes Laurey Morale, MD  lamoTRIgine (LAMICTAL) 200 MG tablet Take 200 mg by mouth daily.   Yes Historical Provider, MD  Multiple Vitamin (MULITIVITAMIN WITH MINERALS) TABS Take 1 tablet by mouth daily.   Yes Historical Provider, MD  mycophenolate (CELLCEPT) 500 MG tablet Take 500 mg by mouth 2 (two) times daily.    Yes Historical Provider, MD  Nebulizers (COMPRESSOR/NEBULIZER) MISC Use with albuterol 09/11/12  Yes Storm Frisk, MD  pantoprazole (PROTONIX) 40 MG tablet Take 40  mg by mouth daily. 08/09/11  Yes Storm Frisk, MD  predniSONE (DELTASONE) 5 MG tablet Take 5 mg by mouth daily. HOLD while on 10mg  prednisone then resume when 10mg  is completed 09/11/12  Yes Storm Frisk, MD  pyridostigmine (MESTINON) 60 MG tablet Take 60 mg by mouth 2 (two) times daily. Take 2 tablets by mouth in the morning, and take 1 tablet by mouth in the afternoon.   Yes Historical Provider, MD  tacrolimus (PROGRAF) 5 MG capsule Take 5 mg by mouth 2 (two) times daily.    Yes Historical Provider, MD  Tamsulosin HCl (FLOMAX) 0.4 MG CAPS Take 0.8 mg by mouth daily.    Yes Historical Provider, MD  traMADol (ULTRAM) 50 MG tablet Take 50 mg by mouth as needed for pain.  03/02/12  Yes Historical Provider, MD  Umeclidinium-Vilanterol 62.5-25 MCG/INH AEPB Inhale 1 puff into the lungs daily. 08/13/12  Yes Storm Frisk, MD  Vilazodone HCl (VIIBRYD) 40 MG TABS Take 20 mg by mouth daily.    Yes Historical Provider, MD  vitamin C (ASCORBIC ACID) 500 MG tablet Take 1,000 mg by mouth daily.    Yes Historical Provider, MD  albuterol (PROVENTIL HFA;VENTOLIN HFA) 108 (90 BASE) MCG/ACT inhaler Inhale 2 puffs into the lungs every 6 (six) hours as needed for wheezing or shortness of breath. 10/27/11   Storm Frisk, MD   Physical Exam: Filed Vitals:   10/04/12 1958 10/04/12 2200 10/04/12 2300 10/05/12 0000  BP: 147/49 123/92 164/71 190/62  Pulse: 75 130 125 65  Temp: 97.3 F (36.3 C)  97.8 F (36.6 C)   TempSrc: Oral  Axillary   Resp: 21 24 24 29   Height:      Weight:      SpO2: 91% 100% 99% 99%     General: Alert, in acute distress secondary to the work of breathing on BiPAP  Cardiovascular: Irregular irregular rhythm and rate, negative murmurs rubs gallops, DP/PT pulse +1 bilateral  Respiratory: Diffuse rales, crackles  Abdomen: Soft nontender nondistended plus bowel sounds  Musculoskeletal: Significant  pedal edema bilaterally to approximately mid thigh ranging from +3 to  +one  Neurologic: Chest secondary to air hunger  Labs on Admission:  Basic Metabolic Panel:  Recent Labs Lab 10/02/12 1702 10/04/12 1146 10/04/12 2036  NA 136 136  --   K 5.9* 5.1  --   CL 107 104  --   CO2 22 21  --   GLUCOSE 181* 92  --   BUN 42* 44*  --   CREATININE 1.5 1.39* 1.44*  CALCIUM 8.9 9.4  --    Liver Function Tests: No results found for this basename: AST, ALT, ALKPHOS, BILITOT, PROT, ALBUMIN,  in the last 168 hours No results found for this basename: LIPASE, AMYLASE,  in the last 168 hours No results found for this basename: AMMONIA,  in the last 168 hours CBC:  Recent Labs Lab 10/02/12 1702 10/04/12 1146 10/04/12 2036  WBC 7.7 8.4 6.6  NEUTROABS 6.9  --   --   HGB 10.2* 10.7* 10.3*  HCT 32.0* 33.1* 33.0*  MCV 79.7 79.6 80.7  PLT 141.0* 126* 132*   Cardiac Enzymes: No results found for this basename: CKTOTAL, CKMB, CKMBINDEX, TROPONINI,  in the last 168 hours  BNP (last 3 results)  Recent Labs  05/20/12 0926 10/02/12 1702 10/04/12 1146  PROBNP 513.0* 662.0* 6763.0*   CBG:  Recent Labs Lab 10/04/12 1838 10/04/12 2314  GLUCAP 105* 177*    Radiological Exams on Admission: Dg Chest Port 1 View  10/04/2012   *RADIOLOGY REPORT*  Clinical Data: Shortness of breath.  PORTABLE CHEST - 1 VIEW  Comparison: 10/02/2012.  Findings: Diffuse slightly asymmetric air space disease similar to the recent examination.  Question pulmonary edema versus infectious infiltrate.  Cardiomegaly post CABG.  Calcified aorta.  IMPRESSION: Diffuse slightly asymmetric air space disease similar to the recent examination.  Question pulmonary edema versus infectious infiltrate. Recommend follow-up until clearance.  Cardiomegaly post CABG.  Calcified aorta.   Original Report Authenticated By: Lacy Duverney, M.D.    EKG: Independently reviewed.   Assessment/Plan Principal Problem:   Chronic diastolic heart failure Active Problems:   HYPERLIPIDEMIA-MIXED   CORONARY  ATHEROSLERO UNSPEC TYPE BYPASS GRAFT   Campath-induced atrial fibrillation   COPD (chronic obstructive pulmonary disease) gold stage D.   DM (diabetes mellitus), type 2   Parkinson's disease   Acute respiratory failure   1. CHF; reviewed films with patient's nurse and wife patient has rare pulmonary edema, we'll aggressively diuresis patient. Patient will have received 160 mg Lasix +5 mg Zaroxolyn. We'll most likely place patient on Lasix drip in the a.m. may continue the Zaroxolyn if patient's creatinine does not significantly increase MUST REMEMBER patient is a renal transplant patient and only has one kidney.  Daily a.m. weights, strict I.'s and O.'s Will obtain 2-D echocardiogram in the a.m. NOTE; cardiology on board patient was seen by Willa Rough, MD, [facility]) in the ED. Will obtain troponin expect to be high secondary to demand ischemia  2. Respiratory failure; see #1  3. Renal transplant; reviewed list of medications with patient's wife patient has had at least one dose of all of his immunosuppressant medications. It was agreed patient with missed his evening dose of immunosuppressants secondary to remaining on BiPAP for now. Will need to restart those medications in the a.m. (cannot be converted to IV medication).   4. COPD; patient currently has medication written when necessary for COPD however owing to his present pulmonary edema would not be able to tolerate  nebulizers.  5. Parkinson's; per wife patient was able take his a.m. doses of medication will hold his evening doses (will give his noontime dose now of Sinemet)  6. Diabetes type 2 on insulin pump; insulin pump was disconnected and taken home Will control patient's diabetes through SSI  7. Atrial Fib; currently in A. fib rate controlled, only on aspirin for anticoagulation   Code Status: Full  Family Communication: Wife present for discussion of plan of care Disposition Plan: Cardiology  Time spent: 120  minutes  Drema Dallas [facility] Pager [phone]  If 7PM-7AM, please contact night-coverage [email or web address] Password [facility] 10/05/2012, 12:18 AM

## 2012-10-04 NOTE — Telephone Encounter (Signed)
Dr. Clifton James (office DOD) received call from John [name], [place]. Pt was seen yesterday and lasix increased. Per Dr. Clifton James pt should continue same treatment and have BMP checked on Monday if he is feeling better after increased Lasix.  If not better he should go to ED.  I spoke with pt who reports urine output has not increased much, shortness of breath has not improved, he does not weigh daily. Overall he is not feeling any better.  I have instructed pt he should go to ED at [facility]. Pt agreeable with this plan.

## 2012-10-04 NOTE — ED Notes (Signed)
This RN spoke with EDP John [name] to determine at what heart rate does he feel it is appropriate to administer Atropine 0.5 mg IVP. EDP John [name] wants to hold off at this time

## 2012-10-04 NOTE — ED Notes (Addendum)
John [name] EMT and this RN assisted pt to a standing position to use the urinal, pt became tacypenic with increase SOB, upon sitting pt stated "I need a breath, I can't take in a deep breath." Pt was on 4L O2 via [place], pt placed on 15L NRB, pt was taking in slow deep breaths as instructed. EDP Fayrene Fearing aware, new orders given and completed

## 2012-10-04 NOTE — ED Notes (Signed)
Sob x 10 days is on home o2  At 4 l [place]  Has seen the dr pulmonary dr given steriods saw another dr and given antibiotics and diuretics has hx of thoracentesis

## 2012-10-04 NOTE — ED Notes (Addendum)
Pt c/o SOB that increases with exertion, chest tightness, and a dry cough x10 days. Pt seen at his pulmonologist 2 days ago and denies any change in his symptoms. Pt prescribed steroids by his pulmonary dr and antibiotics and diuretics by his nephrologist. Pt uses 4L O2 via [place] all the time at home

## 2012-10-04 NOTE — ED Notes (Signed)
Portable XR at bedside

## 2012-10-04 NOTE — ED Notes (Signed)
EDP  James at bedside. 

## 2012-10-05 ENCOUNTER — Inpatient Hospital Stay (HOSPITAL_COMMUNITY): Payer: Medicare Other

## 2012-10-05 DIAGNOSIS — J9600 Acute respiratory failure, unspecified whether with hypoxia or hypercapnia: Secondary | ICD-10-CM

## 2012-10-05 DIAGNOSIS — N189 Chronic kidney disease, unspecified: Secondary | ICD-10-CM

## 2012-10-05 DIAGNOSIS — J449 Chronic obstructive pulmonary disease, unspecified: Secondary | ICD-10-CM

## 2012-10-05 DIAGNOSIS — I2510 Atherosclerotic heart disease of native coronary artery without angina pectoris: Secondary | ICD-10-CM

## 2012-10-05 DIAGNOSIS — I359 Nonrheumatic aortic valve disorder, unspecified: Secondary | ICD-10-CM

## 2012-10-05 LAB — COMPREHENSIVE METABOLIC PANEL
ALT: <5 U/L (ref 0–53)
AST: 14 U/L (ref 0–37)
Albumin: 2.5 g/dL — ABNORMAL LOW (ref 3.5–5.2)
Alkaline Phosphatase: 168 U/L — ABNORMAL HIGH (ref 39–117)
BUN: 44 mg/dL — ABNORMAL HIGH (ref 6–23)
CO2: 25 mEq/L (ref 19–32)
Calcium: 9.2 mg/dL (ref 8.4–10.5)
Chloride: 106 mEq/L (ref 96–112)
Creatinine, Ser: 1.38 mg/dL — ABNORMAL HIGH (ref 0.50–1.35)
GFR calc Af Amer: 57 mL/min — ABNORMAL LOW (ref >90)
GFR calc non Af Amer: 50 mL/min — ABNORMAL LOW (ref >90)
Glucose, Bld: 168 mg/dL — ABNORMAL HIGH (ref 70–99)
Potassium: 5 mEq/L (ref 3.5–5.1)
Sodium: 138 mEq/L (ref 135–145)
Total Bilirubin: 1.2 mg/dL (ref 0.3–1.2)
Total Protein: 6.3 g/dL (ref 6.0–8.3)

## 2012-10-05 LAB — GLUCOSE, CAPILLARY
Glucose-Capillary: 180 mg/dL — ABNORMAL HIGH (ref 70–99)
Glucose-Capillary: 110 mg/dL — ABNORMAL HIGH (ref 70–99)
Glucose-Capillary: 176 mg/dL — ABNORMAL HIGH (ref 70–99)

## 2012-10-05 LAB — TROPONIN I: Troponin I: <0.3 ng/mL (ref <0.30)

## 2012-10-05 LAB — MAGNESIUM: Magnesium: 1.9 mg/dL (ref 1.5–2.5)

## 2012-10-05 MED ORDER — FOLIC ACID 5 MG/ML IJ SOLN
1.0000 mg | Freq: Every day | INTRAMUSCULAR | Status: DC
  Administered 2012-10-05 – 2012-10-06 (×2): 1 mg via INTRAVENOUS
  Filled 2012-10-05 (×2): qty 0.2

## 2012-10-05 MED ORDER — INSULIN ASPART 100 UNIT/ML ~~LOC~~ SOLN
0.0000 [IU] | Freq: Three times a day (TID) | SUBCUTANEOUS | Status: DC
  Administered 2012-10-05: 5 [IU] via SUBCUTANEOUS
  Administered 2012-10-06: 2 [IU] via SUBCUTANEOUS

## 2012-10-05 MED ORDER — METHYLPREDNISOLONE SODIUM SUCC 40 MG IJ SOLR
4.0000 mg | Freq: Every day | INTRAMUSCULAR | Status: DC
  Administered 2012-10-05 – 2012-10-09 (×5): 4 mg via INTRAVENOUS
  Filled 2012-10-05 (×5): qty 0.1

## 2012-10-05 MED ORDER — ACETAMINOPHEN 325 MG PO TABS
650.0000 mg | ORAL_TABLET | ORAL | Status: DC | PRN
  Administered 2012-10-05 – 2012-10-08 (×3): 650 mg via ORAL
  Filled 2012-10-05 (×3): qty 2

## 2012-10-05 MED ORDER — PANTOPRAZOLE SODIUM 40 MG IV SOLR
40.0000 mg | INTRAVENOUS | Status: DC
  Administered 2012-10-05: 40 mg via INTRAVENOUS
  Filled 2012-10-05 (×2): qty 40

## 2012-10-05 MED ORDER — METOLAZONE 5 MG PO TABS
5.0000 mg | ORAL_TABLET | Freq: Every day | ORAL | Status: DC
  Administered 2012-10-05 – 2012-10-07 (×3): 5 mg via ORAL
  Filled 2012-10-05 (×4): qty 1

## 2012-10-05 MED ORDER — LORAZEPAM 2 MG/ML IJ SOLN
0.5000 mg | Freq: Once | INTRAMUSCULAR | Status: AC
  Administered 2012-10-06: 0.5 mg via INTRAVENOUS
  Filled 2012-10-05: qty 1

## 2012-10-05 MED ORDER — FUROSEMIDE 10 MG/ML IJ SOLN
160.0000 mg | INTRAVENOUS | Status: DC
  Administered 2012-10-05 – 2012-10-06 (×3): 160 mg via INTRAVENOUS
  Filled 2012-10-05 (×4): qty 16

## 2012-10-05 MED ORDER — MORPHINE SULFATE 2 MG/ML IJ SOLN
2.0000 mg | INTRAMUSCULAR | Status: DC | PRN
  Filled 2012-10-05: qty 1

## 2012-10-05 NOTE — Progress Notes (Signed)
Thank you for consulting the Palliative Medicine Team at [facility] to meet your patient's and family's needs.   The reason that you asked [place] to see your patient is  For Code status, Goals of care  We have scheduled your patient for a meeting: 10/06/12 @ 11:15 am  The Surrogate decision make is: Wife  Contact information: [phone]  Other family members that need to be present:  Daughter    Your patient is able/unable to participate: Yes

## 2012-10-05 NOTE — Progress Notes (Signed)
Event: 10/04/12 @ 2030:  Notified by RN that pt w/ acute increased WOB. RR currently > 40 using all accessory muscles. BBS very diminished and 02 sats have remained >90. RR RN was paged and is currently at bedside. Per RR RN pt's acute resp distress will require immediate BiPAP and tx to higher level of care. Order placed for transfer to SDU. NP to bedside. Subjective: Pt denies CP to RN's on initial assessment. Wife who was at bedside reported to RN;s that pt's current distress was similar to the he presented to ED when he was placed on BiPAP.  Objective: 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 Seen by his pulmonologist (Dr Shelle Iron) on 10/02/12 w/ c/o increasing SOB over last 3 weeks. At bedside in SDU pt noted on Bi-PAP and RR RN reports he is much improved. His WOB has improved, RR currently 24 w/ 02 sats of 100% on 40% fi02. BBS very diminished w/ scattered wheezes. Remaining VSS. BNP 6763, troponin neg x 1, BUN/Cr 44/1.39, K 5.1, Hgb 10.7/plt 126. CXR shows diffuse slightly asymmetric air space disease similar to the recent examination, question pulmonary edema versus infectious infiltrate, cardiomegaly post CABG, calcified aorta. (CXR on 8/13 was read as moderate layering/loculated right pleural effusion.) EKG reveals Sinus rhythm with 2nd degree A-V block ([place] I). Pt was seen by cardiology in ED and will follow pt w/ [facility].  Assessment/Plan: 1. Acute on Chronic  respiratory failure: Likely multifactorial given significant CAD, COPD, CHF and acute on chronic renal insufficiency (h/o renal transplant on chronic immunosuppressive meds). WOB much improved on Bi-PAP. Will continue for now. I have discussed pt w/ Dr Belinda Block w/ [facility] to make her aware of pt. Admitting MD (Dr Joseph Art) arrived at bedside. He spoke at length with wife regarding plan. Please see his note and recommendations. I spoke at length with pt's wife regarding code status. Though she seems very cognizant of the progressive nature of her husbands COPD and other health issues she relates that he has been intubated twice and has survived and allowed to "go on to continue living". If resuscitation would be needed for him to be allowed to improve they would want full aggressive measures. If that was not the case she would like to make that decision at that time if necessary.  For now pt will be Full Code. Will continue to monitor closely in SDU.  Leanne Chang, NP-C [facility] Pager [phone]

## 2012-10-05 NOTE — Progress Notes (Signed)
  Echocardiogram 2D Echocardiogram has been performed.  John [name] 10/05/2012, 11:19 AM

## 2012-10-05 NOTE — Progress Notes (Signed)
INITIAL NUTRITION ASSESSMENT  DOCUMENTATION CODES Per approved criteria  -Not Applicable   INTERVENTION:  If EN warranted and within goals of care, recommend initiation of Osmolite 1.5 formula at 20 ml/hr and increase by 10 ml every 4 hours to goal rate of 50 ml/hr with Prostat liquid protein 30 ml twice daily via tube to provide 2000 total kcals, 105 gm protein, 914 ml of free water RD to follow for nutrition care plan  NUTRITION DIAGNOSIS: Inadequate oral intake related to inability to eat as evidenced by NPO status  Goal: Pt to meet >/= 90% of their estimated nutrition needs   Monitor:  EN initiation, PO diet advancement, weight, labs, I/O's  Reason for Assessment: Malnutrition Screening Tool Report  72 y.o. male  Admitting Dx: Chronic diastolic heart failure  ASSESSMENT: Patient with PMH of CAD s/p CABG, PAD, renal transplantation on immunosuppressent, Parkinson's disease, prostate CA, COPD on home O2; presented to [facility] with dyspnea; initially admitted to 4 E; transferred to 2900 for respiratory distress.  Patient currently on BiPAP; RD spoke with patient's wife at bedside; reports patient's appetite was decreased PTA; denies that patient has had recent weight loss; noted Palliative Care Team consult placed today for goals of care.  Height: Ht Readings from Last 1 Encounters:  10/04/12 6\' 2"  (1.88 m)    Weight: Wt Readings from Last 1 Encounters:  10/04/12 212 lb 15.4 oz (96.6 kg)    Ideal Body Weight: 190 lb  % Ideal Body Weight: 111%  Wt Readings from Last 10 Encounters:  10/04/12 212 lb 15.4 oz (96.6 kg)  09/10/12 215 lb (97.523 kg)  07/30/12 220 lb (99.791 kg)  07/22/12 220 lb (99.791 kg)  06/25/12 220 lb (99.791 kg)  06/25/12 220 lb (99.791 kg)  06/10/12 220 lb (99.791 kg)  05/29/12 220 lb (99.791 kg)  05/28/12 220 lb (99.791 kg)  05/13/12 220 lb (99.791 kg)    Usual Body Weight: 220 lb  % Usual Body Weight: 96%  BMI:  Body mass index is 27.33  kg/(m^2).  Estimated Nutritional Needs: Kcal: 1900-2100 Protein: 100-110 gm Fluid: 1.9-2.1 L  Skin: small ulcer to R foot  Diet Order: NPO  EDUCATION NEEDS: -No education needs identified at this time   Intake/Output Summary (Last 24 hours) at 10/05/12 1234 Last data filed at 10/05/12 1000  Gross per 24 hour  Intake    376 ml  Output   2360 ml  Net  -1984 ml    Labs:   Recent Labs Lab 10/02/12 1702 10/04/12 1146 10/04/12 2036 10/05/12 0500  NA 136 136  --  138  K 5.9* 5.1  --  5.0  CL 107 104  --  106  CO2 22 21  --  25  BUN 42* 44*  --  44*  CREATININE 1.5 1.39* 1.44* 1.38*  CALCIUM 8.9 9.4  --  9.2  MG  --   --   --  1.9  GLUCOSE 181* 92  --  168*    CBG (last 3)   Recent Labs  10/05/12 0435 10/05/12 0748 10/05/12 1147  GLUCAP 180* 95 110*    Scheduled Meds: . albuterol  5 mg Nebulization Once  . aspirin EC  81 mg Oral Daily  . atorvastatin  40 mg Oral q1800  . calcium carbonate  2 tablet Oral Daily  . carbidopa-levodopa  1 tablet Oral Custom  . carbidopa-levodopa  1 tablet Oral QAC breakfast  . clopidogrel  75 mg Oral Q breakfast  .  enoxaparin (LOVENOX) injection  40 mg Subcutaneous Q24H  . ezetimibe  10 mg Oral Daily  . ferrous sulfate  325 mg Oral Q breakfast  . folic acid  1 mg Intravenous Daily  . furosemide  160 mg Intravenous Custom  . insulin aspart  0-15 Units Subcutaneous Q4H  . isosorbide mononitrate  60 mg Oral Daily  . lamoTRIgine  200 mg Oral Daily  . LORazepam  0.5 mg Intravenous Once  . LORazepam  1 mg Oral Once  . methylPREDNISolone (SOLU-MEDROL) injection  4 mg Intravenous Daily  . metolazone  5 mg Oral Daily  . multivitamin with minerals  1 tablet Oral Daily  . mycophenolate  500 mg Oral BID  . pantoprazole (PROTONIX) IV  40 mg Intravenous Q24H  . sodium chloride  3 mL Intravenous Q12H  . tacrolimus  5 mg Oral BID  . tamsulosin  0.8 mg Oral Daily  . Umeclidinium-Vilanterol  1 puff Inhalation Daily  . Vilazodone HCl   20 mg Oral Daily  . vitamin C  1,000 mg Oral Daily    Continuous Infusions:   Past Medical History  Diagnosis Date  . CAD (coronary artery disease)     a. myoview 1/11: lg inf fixed defect;   b. cath 1/11: 3v CAD,  c. s/p CABG 1/11 c/b intubation, acute renal failure requiring CVVH and AFib  . Atrial fibrillation     a. Post-op CABG.  . Chronic diastolic heart failure     a. echo 4/11: EF 45-50%, mod LVH, LAE, inf and septal HK  . Orthostatic hypotension     a. Shy-Drager syndrome with orthostatic hypotension: improvement with pyridostigmine.   Marland Kitchen History of renal transplant     a. On chronic immunosupp.  Marland Kitchen HTN (hypertension)   . HLD (hyperlipidemia)   . PAD (peripheral artery disease)     a. atherectomy right SFA and right popliteal on 01/12/10. Complicated by left groin pseudoaneurysm. ABIs (3/14) with 0.85 R, 0.76 L (stable).   . ED (erectile dysfunction)   . DDD (degenerative disc disease), lumbar   . Obesity   . Low testosterone   . Pleural effusion, right     a. Loculated right>left pleural effusion: Right thoracentesis (6/12), cytology negative for malignancy.  b. Thoracentesis 09/15/2012  . COPD (chronic obstructive pulmonary disease)     Followed by Dr Delford Field, now on home oxygen.   Marland Kitchen Anxiety   . Movement disorder   . Abnormal ultrasound of carotid artery     a. Carotid dopplers (6/12): 0-39% bilateral ICA stenosis.   . Depression   . Heart block     a. Patient has had profound 1st degree AV block on ECG. Holter (11/12) showed NSR, 1st degree heart block, occasional short runs of type I 2nd degree heart block. Holter (3/14) showed type I 2nd degree AV block. b. Type 1 2nd degree AV block noted 09/2012 admission.  . Aortic stenosis     a. Moderate on 3/14 echo.   . Parkinson's disease   . Complication of anesthesia     "respiratory crisis after heart and lumbar ORs; I can not have general anesthesia" (10/04/2012)  . CHF (congestive heart failure)   . Exertional shortness  of breath   . On home oxygen therapy     "4L pretty much 24/7" (10/04/2012)  . DM2 (diabetes mellitus, type 2)     a. H/o insulin pump.  . Diabetic gastroparesis   . Iron deficiency anemia     "  has had a couple infusions recently" (10/04/2012)  . History of blood transfusion     "several over a large # of years" (10/04/2012)  . Chronic kidney disease     s/p renal transplant 2002  . Prostate cancer     a. s/p seed implantation in 1/06.  . Skin cancer     "on my face" (10/04/2012)    Past Surgical History  Procedure Laterality Date  . Cataract extraction w/ intraocular lens  implant, bilateral Bilateral   . Vitrectomy Left   . Tonsillectomy    . Kidney transplant  08/21/2000  . Radioactive seed implant  2006    "brachytherapy for prostate" (10/04/2012)  . Lumbar laminectomy/decompression microdiscectomy  05/16/2011    Procedure: LUMBAR LAMINECTOMY/DECOMPRESSION MICRODISCECTOMY 1 LEVEL;  Surgeon: Barnett Abu, MD;  Location: [facility];  Service: Neurosurgery;  Laterality: Bilateral;  Bilateral Lumbar Five-Sacral One Laminectomy  . Appendectomy    . Inguinal hernia repair    . Coronary artery bypass graft  03/2009    CABG "X4 or 5" (10/04/2012)  . Cardiac catheterization  03/2009  . Peritoneal catheter insertion  03/2000  . Peritoneal catheter removal  08/21/2000  . Mohs surgery      "couple places on face" (8/15//2014)    Maureen Chatters, RD, LDN Pager #: [phone] After-Hours Pager #: [phone]

## 2012-10-05 NOTE — Consult Note (Signed)
Renal Service / Consult Note John [name] 10/05/2012 John [name] D Requesting Physician:  DR Joseph Art  Reason for Consult:  Renal transplant patient with decomp heart failure and pulm edema HPI: The patient is a 72 y.o. year-old with hx of CAD/CABG 2011, combined diast./syst HF (EF 40-45%), renal transplant 2002 baseline creat 1.2, Parkinson's, PAD, COPD on home O2, DM with gastroparesis and hx of prostate Ca rx'd with seed implantation in 2006.  Has been going through home and OP PT recently and has gotten back to walking short distances.  Recent hx is that pt had thoracentesis for loculated R effusion on 09/15/12.  Also recently completed course of augmentin and pred taper for dyspnea.  Over last two weeks has become more dyspneic, +dry cough and leg edema.  Taking lasix at home 80 bid.    Presented to ED last night with SOB, chest tightness, and severe diffuse pulm edema on CXR. Placed on Bipap and started on high dose IV lasix with po zaroxylyn with good response, 1300 cc out overnight.    Immunosuppressant regimen includes Prograf 5mg  bid, CellCept 500mg  bid and prednisone 5 mg/d .  ROS  unable to answer many questions due to bipap, denies pain or CP currently  Past Medical History  Past Medical History  Diagnosis Date  . CAD (coronary artery disease)     a. myoview 1/11: lg inf fixed defect;   b. cath 1/11: 3v CAD,  c. s/p CABG 1/11 c/b intubation, acute renal failure requiring CVVH and AFib  . Atrial fibrillation     a. Post-op CABG.  . Chronic diastolic heart failure     a. echo 4/11: EF 45-50%, mod LVH, LAE, inf and septal HK  . Orthostatic hypotension     a. Shy-Drager syndrome with orthostatic hypotension: improvement with pyridostigmine.   Marland Kitchen History of renal transplant     a. On chronic immunosupp.  Marland Kitchen HTN (hypertension)   . HLD (hyperlipidemia)   . PAD (peripheral artery disease)     a. atherectomy right SFA and right popliteal on 01/12/10. Complicated by left  groin pseudoaneurysm. ABIs (3/14) with 0.85 R, 0.76 L (stable).   . ED (erectile dysfunction)   . DDD (degenerative disc disease), lumbar   . Obesity   . Low testosterone   . Pleural effusion, right     a. Loculated right>left pleural effusion: Right thoracentesis (6/12), cytology negative for malignancy.  b. Thoracentesis 09/15/2012  . COPD (chronic obstructive pulmonary disease)     Followed by Dr Delford Field, now on home oxygen.   Marland Kitchen Anxiety   . Movement disorder   . Abnormal ultrasound of carotid artery     a. Carotid dopplers (6/12): 0-39% bilateral ICA stenosis.   . Depression   . Heart block     a. Patient has had profound 1st degree AV block on ECG. Holter (11/12) showed NSR, 1st degree heart block, occasional short runs of type I 2nd degree heart block. Holter (3/14) showed type I 2nd degree AV block. b. Type 1 2nd degree AV block noted 09/2012 admission.  . Aortic stenosis     a. Moderate on 3/14 echo.   . Parkinson's disease   . Complication of anesthesia     "respiratory crisis after heart and lumbar ORs; I can not have general anesthesia" (10/04/2012)  . CHF (congestive heart failure)   . Exertional shortness of breath   . On home oxygen therapy     "4L pretty much 24/7" (10/04/2012)  .  DM2 (diabetes mellitus, type 2)     a. H/o insulin pump.  . Diabetic gastroparesis   . Iron deficiency anemia     "has had a couple infusions recently" (10/04/2012)  . History of blood transfusion     "several over a large # of years" (10/04/2012)  . Chronic kidney disease     s/p renal transplant 2002  . Prostate cancer     a. s/p seed implantation in 1/06.  . Skin cancer     "on my face" (10/04/2012)   Past Surgical History  Past Surgical History  Procedure Laterality Date  . Cataract extraction w/ intraocular lens  implant, bilateral Bilateral   . Vitrectomy Left   . Tonsillectomy    . Kidney transplant  08/21/2000  . Radioactive seed implant  2006    "brachytherapy for prostate"  (10/04/2012)  . Lumbar laminectomy/decompression microdiscectomy  05/16/2011    Procedure: LUMBAR LAMINECTOMY/DECOMPRESSION MICRODISCECTOMY 1 LEVEL;  Surgeon: Barnett Abu, MD;  Location: [facility];  Service: Neurosurgery;  Laterality: Bilateral;  Bilateral Lumbar Five-Sacral One Laminectomy  . Appendectomy    . Inguinal hernia repair    . Coronary artery bypass graft  03/2009    CABG "X4 or 5" (10/04/2012)  . Cardiac catheterization  03/2009  . Peritoneal catheter insertion  03/2000  . Peritoneal catheter removal  08/21/2000  . Mohs surgery      "couple places on face" (8/15//2014)   Family History  Family History  Problem Relation Age of Onset  . Coronary artery disease Mother   . Valvular heart disease Father   . Hepatitis      sibling  . Coronary artery disease Sister   . Heart attack Sister   . Tuberculosis Father    Social History  reports that he quit smoking about 34 years ago. His smoking use included Cigarettes. He has a 50 pack-year smoking history. He has never used smokeless tobacco. He reports that he does not drink alcohol or use illicit drugs. Allergies No Known Allergies Home medications Prior to Admission medications   Medication Sig Start Date End Date Taking? Authorizing Provider  albuterol (PROVENTIL) (2.5 MG/3ML) 0.083% nebulizer solution Take 2.5 mg by nebulization every 6 (six) hours as needed for wheezing. 09/11/12  Yes Storm Frisk, MD  aspirin 81 MG tablet Take 81 mg by mouth daily.   Yes Historical Provider, MD  atorvastatin (LIPITOR) 40 MG tablet Take 40 mg by mouth daily.    Yes Historical Provider, MD  calcium carbonate (OS-CAL - DOSED IN MG OF ELEMENTAL CALCIUM) 1250 MG tablet Take 2 tablets by mouth daily.   Yes Historical Provider, MD  carbidopa-levodopa (SINEMET CR) 50-200 MG per tablet Take 0.5-1 tablets by mouth 2 (two) times daily. Take one-half tablet in the morning,One tablet at 11 am before lunchtime,and 1 tablet at 5 pm before dinner.   Yes  Historical Provider, MD  Cholecalciferol (VITAMIN D-3) 5000 UNITS TABS Take 2 tablets by mouth 3 (three) times a week.   Yes Historical Provider, MD  clopidogrel (PLAVIX) 75 MG tablet Take 75 mg by mouth daily.   Yes Historical Provider, MD  ezetimibe (ZETIA) 10 MG tablet Take 10 mg by mouth daily.    Yes Historical Provider, MD  Ferrous Sulfate (IRON) 325 (65 FE) MG TABS Take 1 tablet by mouth daily.    Yes Historical Provider, MD  folic acid (FOLVITE) 400 MCG tablet Take 400 mcg by mouth daily.   Yes Historical Provider, MD  isosorbide mononitrate (IMDUR) 60 MG 24 hr tablet Take 60 mg by mouth daily. 03/01/12  Yes Laurey Morale, MD  lamoTRIgine (LAMICTAL) 200 MG tablet Take 200 mg by mouth daily.   Yes Historical Provider, MD  Multiple Vitamin (MULITIVITAMIN WITH MINERALS) TABS Take 1 tablet by mouth daily.   Yes Historical Provider, MD  mycophenolate (CELLCEPT) 500 MG tablet Take 500 mg by mouth 2 (two) times daily.    Yes Historical Provider, MD  pantoprazole (PROTONIX) 40 MG tablet Take 40 mg by mouth daily. 08/09/11  Yes Storm Frisk, MD  predniSONE (DELTASONE) 5 MG tablet Take 5 mg by mouth daily. HOLD while on 10mg  prednisone then resume when 10mg  is completed 09/11/12  Yes Storm Frisk, MD  pyridostigmine (MESTINON) 60 MG tablet Take 60 mg by mouth 2 (two) times daily. Take 2 tablets by mouth in the morning, and take 1 tablet by mouth in the afternoon.   Yes Historical Provider, MD  tacrolimus (PROGRAF) 5 MG capsule Take 5 mg by mouth 2 (two) times daily.    Yes Historical Provider, MD  Tamsulosin HCl (FLOMAX) 0.4 MG CAPS Take 0.8 mg by mouth daily.    Yes Historical Provider, MD  traMADol (ULTRAM) 50 MG tablet Take 50 mg by mouth as needed for pain.  03/02/12  Yes Historical Provider, MD  Umeclidinium-Vilanterol 62.5-25 MCG/INH AEPB Inhale 1 puff into the lungs daily. 08/13/12  Yes Storm Frisk, MD  Vilazodone HCl (VIIBRYD) 40 MG TABS Take 20 mg by mouth daily.    Yes Historical  Provider, MD  vitamin C (ASCORBIC ACID) 500 MG tablet Take 1,000 mg by mouth daily.    Yes Historical Provider, MD  albuterol (PROVENTIL HFA;VENTOLIN HFA) 108 (90 BASE) MCG/ACT inhaler Inhale 2 puffs into the lungs every 6 (six) hours as needed for wheezing or shortness of breath. 10/27/11   Storm Frisk, MD   Liver Function Tests  Recent Labs Lab 10/05/12 0500  AST 14  ALT <5  ALKPHOS 168*  BILITOT 1.2  PROT 6.3  ALBUMIN 2.5*   No results found for this basename: LIPASE, AMYLASE,  in the last 168 hours CBC  Recent Labs Lab 10/02/12 1702 10/04/12 1146 10/04/12 2036  WBC 7.7 8.4 6.6  NEUTROABS 6.9  --   --   HGB 10.2* 10.7* 10.3*  HCT 32.0* 33.1* 33.0*  MCV 79.7 79.6 80.7  PLT 141.0* 126* 132*   Basic Metabolic Panel  Recent Labs Lab 10/02/12 1702 10/04/12 1146 10/04/12 2036 10/05/12 0500  NA 136 136  --  138  K 5.9* 5.1  --  5.0  CL 107 104  --  106  CO2 22 21  --  25  GLUCOSE 181* 92  --  168*  BUN 42* 44*  --  44*  CREATININE 1.5 1.39* 1.44* 1.38*  CALCIUM 8.9 9.4  --  9.2    Physical Exam  Blood pressure 149/62, pulse 64, temperature 98 F (36.7 C), temperature source Oral, resp. rate 21, height 6\' 2"  (1.88 m), weight 96.6 kg (212 lb 15.4 oz), SpO2 97.00%. Gen: older adult male on bipap, resting , awakens easily , able to verbalize Skin: no rash, cyanosis HEENT:  EOMI, sclera anicteric, throat not examined Neck: ++ JVD Chest: bilat basilar rales, scattered rhonchi CV: regular, 2-3/6 SEM RUSB, no rub or gallop Abdomen: soft, mod obese, liver down 4cm, no ascites, nontender Ext: 3+ bilat LE edema, no joint effusion, small shallow ulcer dorsum of right  foot with some erythema surrounding Neuro: sleeping, arouseable, gen weakness, nonfocal exam in general   Impression: 1. Acute resp failure due to combination of factors including underlying COPD, syst/diast CHF, aortic stenosis, renal transplant, recurrent R loculated pleural effusions.  Marked pulm  edema on presentation and volume overload.  Diuresing with high dose IV lasix and metolazone overnight so far. 2. Renal transplant 2002 baseline creat 1.0-1.6 3. Combined syst/diast heart failure, EF 40-45% 4. Recurrent loculated pleural effusion 5. COPD on home O2 6. Parkinson's disease 7. Chronic debility 8. CAD w CABG 2011 9. Hypertension- BP's high, not on BP meds at home, vol excess likely contributing  Plan- cont IV lasix q 8hrs and zaroxyln, getting IV solumedrol for po pred, give other transplant meds with sips of water while on bipap. Patient has multiple and severe comorbidities with declining health and QOL. His wife is caretaker and struggling with making decisions regarding EOL.  She agrees to meet with palliative care team, have placed consult and have d/w pall care MD.       Vinson Moselle  MD Pager [phone]    Cell  [phone] 10/05/2012, 8:40 AM

## 2012-10-05 NOTE — Progress Notes (Signed)
Patient Name: John [name]. Date of Encounter: 10/05/2012  Primary cardiologist: Dr. [name]   Principal Problem:   Chronic diastolic heart failure Active Problems:   HYPERLIPIDEMIA-MIXED   CORONARY ATHEROSLERO UNSPEC TYPE BYPASS GRAFT   Campath-induced atrial fibrillation   COPD (chronic obstructive pulmonary disease) gold stage D.   DM (diabetes mellitus), type 2   Parkinson's disease   Acute respiratory failure    SUBJECTIVE: Respiratory decompensation early this AM. Placed on BiPAP and transferred to CCU. Somnolent.   OBJECTIVE  Filed Vitals:   10/05/12 0100 10/05/12 0200 10/05/12 0400 10/05/12 0746  BP: 176/54 176/51 149/62   Pulse: 56 124 64   Temp:   98 F (36.7 C) 98 F (36.7 C)  TempSrc:   Oral Oral  Resp: 38 29 21   Height:      Weight:      SpO2: 96% 98% 97%     Intake/Output Summary (Last 24 hours) at 10/05/12 0859 Last data filed at 10/05/12 0400  Gross per 24 hour  Intake    370 ml  Output   1710 ml  Net  -1340 ml    PHYSICAL EXAM  General: Somnolent, on BiPAP Head: Normocephalic, atraumatic, sclera non-icteric, no xanthomas, nares are without discharge.  Neck: Supple without bruits or JVP to earlobes. Lungs:  Respirations aided by BiPAP. No increased respiratory effort. Rales appreciated at L lung base, decreased breath sounds R lung base, dull to percussion. No wheezes. Heart: Bradycardic, regularly irregular, III/VI systolic crescendo-decrescendo at RUSB/LLSB, no s3, s4 Abdomen: Soft, non-tender, non-distended, BS + x 4.  Msk:  Strength and tone appears normal for age. Extremities: 3-4+ bilateral pitting edema extending to inferior thigh. No clubbing, cyanosis or edema. DP/PT/Radials 2+ and equal bilaterally. R dorsal foot ulceration with peri-lesional erythema.  Neuro: Alert and oriented X 3. Moves all extremities spontaneously. Psych: Normal affect. Skin: pink, moist, warm; cap refill < 2 sec  LABS:  Recent Labs   10/04/12  1146  10/04/12  2036  WBC  8.4  6.6  HGB  10.7*  10.3*  HCT  33.1*  33.0*  MCV  79.6  80.7  PLT  126*  132*   Recent Labs Lab 10/02/12 1702 10/04/12 1146 10/04/12 2036 10/05/12 0500  NA 136 136  --  138  K 5.9* 5.1  --  5.0  CL 107 104  --  106  CO2 22 21  --  25  BUN 42* 44*  --  44*  CREATININE 1.5 1.39* 1.44* 1.38*  CALCIUM 8.9 9.4  --  9.2  PROT  --   --   --  6.3  BILITOT  --   --   --  1.2  ALKPHOS  --   --   --  168*  ALT  --   --   --  <5  AST  --   --   --  14  GLUCOSE 181* 92  --  168*   Recent Labs     10/05/12  0050  10/05/12  0500  TROPONINI  <0.30  <0.30   Recent Labs  10/04/12 2036  TSH 2.000   TELE: Wenkebach, HR 50s  ECG: Wenkebach, 66 bpm, LVH, LAD, no ST/T changes   Radiology/Studies:  Dg Chest 1 View  09/12/2012   *RADIOLOGY REPORT*  Clinical Data: Post right-sided thoracentesis  CHEST - 1 VIEW  Comparison: 09/11/2012; 06/02/2011; 05/27/2011; chest CT - 05/27/2011  Findings:  Interval reduction in  persistent small right-sided pleural effusion post thoracentesis.  No pneumothorax.  Unchanged small left-sided pleural effusion.  Grossly unchanged cardiac silhouette and mediastinal contours post median sternotomy and CABG. The pulmonary vasculature remains indistinct with cephalization of flow.  Grossly unchanged perihilar and bibasilar opacities.  No new focal airspace opacities. Unchanged bones.  IMPRESSION: 1.  Interval reduction of persistent small right-sided pleural effusion post thoracentesis.  No pneumothorax. 2.  Persistent findings of pulmonary edema and perihilar/bibasilar opacities, atelectasis versus infiltrate.   Original Report Authenticated By: Tacey Ruiz, MD   Dg Chest 2 View  10/02/2012   *RADIOLOGY REPORT*  Clinical Data: Cough, shortness of breath  CHEST - 2 VIEW  Comparison: 09/20/2012  Findings: Hazy opacity overlying the right mid/lower lung likely reflects a moderate layering/loculated right pleural effusion.   Suspected small left pleural effusion/pleural thickening.  These findings are unchanged.  Cardiomegaly. Postsurgical changes related to prior CABG.  Visualized osseous structures are within normal limits.  IMPRESSION: Moderate layering/loculated right pleural effusion.  Small left pleural effusion/pleural thickening.  When compared the prior study, these findings are unchanged.   Original Report Authenticated By: Charline Bills, M.D.   Dg Chest 2 View  09/20/2012   *RADIOLOGY REPORT*  Clinical Data: Cough.  Dyspnea on exertion.  CHEST - 2 VIEW  Comparison: 07/24/2014and CT chest from 05/30/2011 and chest x-ray from 05/10/2011.  Findings: Two-view exam shows cardiomegaly with underlying diffuse chronic interstitial coarsening.  Vascular congestion noted.  Pleural thickening versus posterior loculated pleural fluid noted on the right.  Less prominent pleural thickening/fluid is noted on the left. There is bibasilar atelectasis or infiltrate  IMPRESSION: Two-view chest x-ray is essentially unchanged from the 05/10/2011 exam.  Loculated posterior pleural fluid with overlying lower lobe collapse / consolidation.   Original Report Authenticated By: Kennith Center, M.D.   Dg Chest 2 View  09/11/2012   *RADIOLOGY REPORT*  Clinical Data: COPD exacerbation, pleural effusion, shortness of breath  CHEST - 2 VIEW  Comparison: 06/02/2011  Findings: Enlargement of cardiac silhouette post CABG. Atherosclerotic calcification aorta. Slight pulmonary vascular congestion. Persistent right pleural effusion with suspect small left pleural effusion as well. Atelectasis versus infiltrate in the lower right lung. Minimal atelectasis left base. No pneumothorax or acute osseous findings. Underlying emphysematous changes. Motion artifacts degrade lateral view.  IMPRESSION: Mild enlargement of cardiac silhouette post CABG. COPD changes with persistent partially loculated bilateral pleural effusions and atelectasis versus consolidation in  the right lower lobe. Due to the extensive opacification seen in the posterior lower right chest, recommend radiographic follow-up until resolution to exclude underlying abnormalities.   Original Report Authenticated By: Ulyses Southward, M.D.   Dg Chest Port 1 View  10/05/2012   *RADIOLOGY REPORT*  Clinical Data: Pulmonary edema.  Evaluate for respiratory status and lung status.  PORTABLE CHEST - 1 VIEW  Comparison: 10/04/2012.  Findings: Patchy bilateral airspace lung opacities are similar to the previous day's exam.  There is additional opacity at the right lung base consistent with atelectasis and a small effusion.  This is also stable.  There is no pneumothorax.  Changes from CABG surgery are stable.  The cardiac silhouette is normal in size.  IMPRESSION: Persistent bilateral airspace lung opacities with additional right lung base pleural fluid and atelectasis.  There is been no change from prior exam. The bilateral lung opacities are consistent with pulmonary edema.   Original Report Authenticated By: Amie Portland, M.D.   Dg Chest Port 1 View  10/04/2012   *RADIOLOGY REPORT*  Clinical Data: Shortness of breath.  PORTABLE CHEST - 1 VIEW  Comparison: 10/02/2012.  Findings: Diffuse slightly asymmetric air space disease similar to the recent examination.  Question pulmonary edema versus infectious infiltrate.  Cardiomegaly post CABG.  Calcified aorta.  IMPRESSION: Diffuse slightly asymmetric air space disease similar to the recent examination.  Question pulmonary edema versus infectious infiltrate. Recommend follow-up until clearance.  Cardiomegaly post CABG.  Calcified aorta.   Original Report Authenticated By: Lacy Duverney, M.D.   US Thoracentesis Asp Pleural Space W/img Guide  09/15/2012   *RADIOLOGY REPORT*  Clinical Data:  Patient with history of prior CABG, COPD, right pleural effusion, dyspnea.  Request is made for therapeutic right thoracentesis.  ULTRASOUND GUIDED  THERAPEUTIC RIGHT THORACENTESIS  An  ultrasound guided thoracentesis was thoroughly discussed with the patient and questions answered.  The benefits, risks, alternatives and complications were also discussed.  The patient understands and wishes to proceed with the procedure.  Written consent was obtained.  Ultrasound was performed to localize and mark an adequate pocket of fluid in the right chest.  The area was then prepped and draped in the normal sterile fashion.  1% Lidocaine was used for local anesthesia.  Under ultrasound guidance a 19 gauge Yueh catheter was introduced.  Thoracentesis was performed.  The catheter was removed and a dressing applied.  Complications:  none  Findings: A total of approximately 150 cc's of blood tinged serous fluid was removed. Due to the multiloculated nature of the collection and despite catheter manipulation, only the above amount of fluid could be aspirated at this time.  IMPRESSION: Successful ultrasound guided therapeutic right thoracentesis yielding 150 cc's of pleural fluid. Due to the multiloculated nature of the collection and small amount of fluid removed follow- up CT chest may be helpful for further characterization.  Read by: Jeananne Rama, P.A.-C   Original Report Authenticated By: Irish Lack, M.D.    Current Medications:  . albuterol  5 mg Nebulization Once  . aspirin  81 mg Oral Daily  . aspirin EC  81 mg Oral Daily  . atorvastatin  40 mg Oral q1800  . calcium carbonate  2 tablet Oral Daily  . carbidopa-levodopa  1 tablet Oral Custom  . carbidopa-levodopa  1 tablet Oral QAC breakfast  . clopidogrel  75 mg Oral Q breakfast  . enoxaparin (LOVENOX) injection  40 mg Subcutaneous Q24H  . ezetimibe  10 mg Oral Daily  . ferrous sulfate  325 mg Oral Q breakfast  . folic acid  400 mcg Oral Daily  . furosemide  160 mg Intravenous Custom  . insulin aspart  0-15 Units Subcutaneous Q4H  . isosorbide mononitrate  60 mg Oral Daily  . lamoTRIgine  200 mg Oral Daily  . LORazepam  0.5 mg  Intravenous Once  . LORazepam  1 mg Oral Once  . metolazone  5 mg Oral Daily  . multivitamin with minerals  1 tablet Oral Daily  . mycophenolate  500 mg Oral BID  . pantoprazole  40 mg Oral Q breakfast  . predniSONE  5 mg Oral Q breakfast  . pyridostigmine  60 mg Oral BID  . sodium chloride  3 mL Intravenous Q12H  . tacrolimus  5 mg Oral BID  . tamsulosin  0.8 mg Oral Daily  . Umeclidinium-Vilanterol  1 puff Inhalation Daily  . Vilazodone HCl  20 mg Oral Daily  . vitamin C  1,000 mg Oral Daily    ASSESSMENT AND PLAN:  72 y/o M  with complex PMH including CAD s/p CABG 2011 (post-op course complicated by pulmonary edema requiring intubation, renal failure requiring CVVH, and atrial fibrillation), PAD, renal transplantation on immunosuppressent, orthostatic hypotension/ShyDrager, Parkinson's disease, prostate CA, mod AS, pleural effusion s/p thoracenteses (recently 08/2012), COPD with chronic resp failure on home O2 at 4l/min and chronic prednisone, intermittent heart block (1st degree, type 1 2nd degree), and mod aortic stenosis who presented to [facility] with dyspnea.  1. Acute on chronic multifactorial respiratory failure Acute on chronic diastolic CHF, underlying COPD, A/CKD. H/o R loculated pleural effusion. Remains on BiPAP this AM. O2 sat's high 90s. CXR w/ evidence of pulmonary edema, recurrent R plural effusion with atelectasis.  -- Request pulmonary consult, recently seen by Dr. Shelle Iron 8/13 as an outpatient  -- Continue BiPAP support, wean as tolerated  -- Continue diuresis (see below)  2. Acute on chronic diastolic CHF Received several doses of IV Lasix last PM and zaroxolyn 5mg  last night. UOP good (I/O - 1340 mL). Lasix 160mg  TID + Zaroxolyn 5mg  planned to start today. Appreciate renal recs. Renal function stable (BUN 44/Cr 1.38-- c/w yesterday's BMET, Cr improved from 8/13). pBNP 6763 (markedly elevated from 8/13-- 662 at that time). CXR- pulmonary edema, R pleural effusion with  atelectasis. Trop-I WNL x 3. TSH WNL. H/o mod AS. Systolic murmur c/w AS appreciated on exam. 2D echo pending  -- Continue diuresis per renal recs  -- Check repeat pBNP, portable CXR  -- Aim to avoid hypertension  -- Limit IVF  -- Review 2D echo to assess EF, reassess AV  -- Apply TED hose and SCDs, strict I/Os, daily weights, salt/fluid restriction   3. 2nd degree AVB, Mobitz type 1 Wenkebach  Sustaining Wenkebach pattern on telemetry. HR 50s. ? Secondary to hypoxia, renal insufficiency.   -- Hold on AVN blockers  -- Established yesterday that pyridostigmine, which the patient was on for orthostatic hypotension, may induce bradycardia. This has been held.  -- Continue to monitor on telemetry with ongoing diuresis  4. Hypertension Limited options for antihypertensives. He has a history of hypotension on ACEi/ARB, recent h/o hyperkalemia, and tenuous renal function. No AVN blocking agents with heart block. Advanced COPD precludes use of BB as well.   -- Aim to avoid hypertension and recurrent pulmonary edema, could consider vasodilators  5. CAD s/p CABG x 5 Trop-I WNL x 3. Effectively ruled out overnight. EKG w/o evidence of ischemia this AM.   -- Continue ASA/Plavix/statin/Imdur  Signed, R. Hurman Horn, PA-C 10/05/2012, 8:59 AM   Attending note:  Patient seen and examined. Very complex. Have discussed with Mr. Shaune Spittle - see recommendations as per above note. Reasonable diuresis on high-dose divided dose Lasix and Zaroxolyn. May need to consider Lasix drip ultimately depending on clinical status. Agree with pulmonary and nephrology consultations to assist with care.  Jonelle Sidle, M.D., F.A.C.C.

## 2012-10-05 NOTE — Progress Notes (Signed)
[facility] PROGRESS NOTE  John [name]. ZOX:[id number] DOB: 1941/02/07 DOA: 10/04/2012 PCP: Hoyle Sauer, MD  Assessment/Plan: 1. CHF; reviewed films with patient's nurse and wife patient has rare pulmonary edema, we'll aggressively diuresis patient. Patient will have received 160 mg Lasix +5 mg Zaroxolyn. We'll most likely place patient on Lasix drip in the a.[name]. may continue the Zaroxolyn if patient's creatinine does not significantly increase MUST REMEMBER patient is a renal transplant patient and only has one kidney. Daily a.[name]. weights, strict I.'s and O.'s Will obtain 2-D echocardiogram in the a.[name]. NOTE; cardiology on board patient was seen by Willa Rough, MD, [facility]) in the ED. Will obtain troponin expect to be high secondary to demand ischemia   2. Respiratory failure; see #1   3. Renal transplant; reviewed list of medications with patient's wife patient has had at least one dose of all of his immunosuppressant medications. It was agreed patient with missed his evening dose of immunosuppressants secondary to remaining on BiPAP for now. Converted on medication to IV which could be converted, held all the by mouth medication except for immunosuppressant, Parkinson's medication, cardiac medication. Counseled the nephrology Dr. Delano Metz for help with aggressive diuresis the patient with one kidney.  Increased IV Lasix to 160 mg every 8 hours+ 5 mg Zaroxolyn daily. Admission weight= 215lbs, current weight= 212lbs  admission creatinine = 1.44 , current creatinine = 1.38 , baseline creatinine = approximately is 0.9 continue strict I.'s and O.'s, daily a.[name]. weights  4. COPD; patient currently has medication written when necessary for COPD however owing to his present pulmonary edema would not be able to tolerate nebulizers.   5. Parkinson's; restart patient's Sinemet today  6. Diabetes type 2 on insulin pump; insulin pump was disconnected and taken home Will  control patient's diabetes through SSI   7. Atrial Fib; currently in A. fib rate controlled, only on aspirin for anticoagulation  Code Status: Full Family Communication: Wife at bedside to discuss plan of care Disposition Plan:    Consultants:  nephrology Dr. Delano Metz  Procedure  CXR 10/04/2012  Diffuse slightly asymmetric air space disease similar to the recent  examination. Question pulmonary edema versus infectious  infiltrate. Recommend follow-up until clearance.  Cardiomegaly post CABG.  Calcified aorta.    Antibiotics:   (indicate start date, and stop date if known)  HPI/Subjective: John [name]. is a 72 y.o. 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  With regard to history, he had a long and complicated hospital admission in 1/11 when was admitted with pulm edema/NSTEMI. Stress test was abnormal so he went on to have cath showing severe disease involving the LM, LAD, ramus, CFX and RCA. He underwent CABG x 5. Post-op course was complicated by pulmonary edema requiring intubation, renal failure requiring CVVH, and atrial fibrillation. He was in the hospital about 7 weeks. In 11/11, patient had right SFA and popliteal atherectomy that was complicated by a left groin pseudoaneurysm. In the fall of 2012, John [name] reported worsening orthostatic-type lightheadedness and worsening exertional  dyspnea. Dr. Shirlee Latch decided at that point to take him for  right and left heart cath in 10/12. On Lasix, his right and left heart filling pressures were not significantly elevated. His bypass grafts were patent but he had an ungrafted PDA with 95% stenosis (small vessel that would not be amenable to PCI). As this was a possible source of ischemia and could potentially cause exertional dyspnea (his anginal equivalent was dyspnea in the past), Dr. Shirlee Latch started him on Imdur and took him off lisinopril due to worsening orthostatic symptoms. He has been on pyridostigmine for orthostatic hypotension with supine hypertension. In 3/13, John [name] had back surgery. Post-operative course was complicated by fluid retention and respiratory failure requiring intubation. He developed PNA and delirium. He had a prolonged hospitalization and rehabilitation. He has been having difficulty walking due to poor balance and right foot drop. He has seen neurology and was diagnosed with Parkinson's. He is wearing a brace which has helped with the foot drop. Last echo in 3/14 showed normal EF, moderate LVH, and moderate aortic stenosis.  More recently, the patient underwent R thoracentesis 09/15/12 for loculated pleural effusion. He was also recently completed a course of Augmentin and prednisone taper for dyspnea. Over the past 10 days, however, he has developed significant DOE, dry cough, LEE, and increase in abdominal girth. Decreased UOP per wife - pt hasn't noticed. He was evaluated in the pulmonary office 2 days ago at which time Lasix was increased to 80mg  BID. LE dopplers were negative for DVT. He saw Dr. Abel Presto yesterday and K was discontinued due to level 5.7, prednisone was decreased from 10->5mg , and Lasix was increased to 120mg  BID. He took 2 doses of that yesterday and 1 dose this AM, but was still significantly SOB so his wife drove him to the ER. He was hypoxic at 87% on [place] thus was placed on bipap. He has been intermittently bradycardic down to the mid 30's but is currently  maintaining 40s-60s. He is in [place]. At times when dropping slower he has a 2:1 pattern but per discussion with Dr. Graciela Husbands with EP, this is likely [place] as well given surrounding rhythm. He is still mentating well and BP is stable. Please note in his chart the vitals show HR in 120's but this is an error. He has not been tachycardic this admission. pBNP 6763, troponin neg x 1, BUN/Cr 44/1.39, K 5.1, Hgb 10.7/plt 126. CXR shows diffuse slightly asymmetric air space disease similar to the recent examination, question pulmonary edema versus infectious infiltrate, cardiomegaly post CABG, calcified aorta. (CXR on 8/13 was read as moderate layering/loculated right pleural effusion.) He denies any fever, chills, orthopnea, CP, palpitations, or syncope. Difficult to weigh regularly at home due to balance, but weight is stable today compared to last in 08/2012 and down from May/June. He received 80mg  IV Lasix and atrovent/proventil neb in the ER and is beginning to have good UOP. Patient seen by Willa Rough, MD,([facility]) in the ED. Patient initially was admitted to 4 E. rapid response team was called when patient into respiratory distress and patient was transferred to 2900. Currently patient still feeling short of breath while on BiPAP, hypertensive, respiration rate in the high 20s -30s. Most of this history was obtained from the wife was at his bedside. TODAY states is much more comfortable breathing. States unsure why he is on  Pyridostigmine.   Objective: Filed Vitals:   10/05/12 0000 10/05/12 0100 10/05/12 0200 10/05/12 0400  BP: 190/62 176/54  176/51 149/62  Pulse: 65 56 124 64  Temp:    98 F (36.7 C)  TempSrc:    Oral  Resp: 29 38 29 21  Height:      Weight:      SpO2: 99% 96% 98% 97%    Intake/Output Summary (Last 24 hours) at 10/05/12 0648 Last data filed at 10/05/12 0400  Gross per 24 hour  Intake    370 ml  Output   1710 ml  Net  -1340 ml   Filed Weights   10/04/12  1154 10/04/12 1822 10/04/12 2300  Weight: 97.523 kg (215 lb) 96.2 kg (212 lb 1.3 oz) 96.6 kg (212 lb 15.4 oz)    Exam:   General:  Alert, work of breathing much improved, sleeping but easily arousable and answers questions appropriately  Cardiovascular: Irregular irregular rhythm, positive holosystolic murmur, negative rubs or gallops, DP/PT pulse nonpalpable  Respiratory: Clear to auscultation bilateral apices, mid/lower lung lobes continue to have diffuse rales  Abdomen: Soft nontender nondistended plus bowel sounds  Musculoskeletal: Continued bilateral pedal edema 3+ to just superior to knees; some improvement   Data Reviewed: Basic Metabolic Panel:  Recent Labs Lab 10/02/12 1702 10/04/12 1146 10/04/12 2036 10/05/12 0500  NA 136 136  --  138  K 5.9* 5.1  --  5.0  CL 107 104  --  106  CO2 22 21  --  25  GLUCOSE 181* 92  --  168*  BUN 42* 44*  --  44*  CREATININE 1.5 1.39* 1.44* 1.38*  CALCIUM 8.9 9.4  --  9.2  MG  --   --   --  1.9   Liver Function Tests:  Recent Labs Lab 10/05/12 0500  AST 14  ALT <5  ALKPHOS 168*  BILITOT 1.2  PROT 6.3  ALBUMIN 2.5*   No results found for this basename: LIPASE, AMYLASE,  in the last 168 hours No results found for this basename: AMMONIA,  in the last 168 hours CBC:  Recent Labs Lab 10/02/12 1702 10/04/12 1146 10/04/12 2036  WBC 7.7 8.4 6.6  NEUTROABS 6.9  --   --   HGB 10.2* 10.7* 10.3*  HCT 32.0* 33.1* 33.0*  MCV 79.7 79.6 80.7  PLT 141.0* 126* 132*   Cardiac Enzymes:  Recent Labs Lab 10/05/12 0050 10/05/12 0500  TROPONINI <0.30 <0.30   BNP (last 3 results)  Recent Labs  05/20/12 0926 10/02/12 1702 10/04/12 1146  PROBNP 513.0* 662.0* 6763.0*   CBG:  Recent Labs Lab 10/04/12 1838 10/04/12 2314 10/05/12 0435  GLUCAP 105* 177* 180*    No results found for this or any previous visit (from the past 240 hour(s)).   Studies: Dg Chest Port 1 View  10/04/2012   *RADIOLOGY REPORT*  Clinical  Data: Shortness of breath.  PORTABLE CHEST - 1 VIEW  Comparison: 10/02/2012.  Findings: Diffuse slightly asymmetric air space disease similar to the recent examination.  Question pulmonary edema versus infectious infiltrate.  Cardiomegaly post CABG.  Calcified aorta.  IMPRESSION: Diffuse slightly asymmetric air space disease similar to the recent examination.  Question pulmonary edema versus infectious infiltrate. Recommend follow-up until clearance.  Cardiomegaly post CABG.  Calcified aorta.   Original Report Authenticated By: Lacy Duverney, [name].D.    Scheduled Meds: . albuterol  5 mg Nebulization Once  . aspirin  81 mg Oral Daily  . aspirin EC  81 mg Oral Daily  . atorvastatin  40 mg Oral q1800  . calcium carbonate  2  tablet Oral Daily  . carbidopa-levodopa  1 tablet Oral Custom  . carbidopa-levodopa  1 tablet Oral QAC breakfast  . clopidogrel  75 mg Oral Q breakfast  . enoxaparin (LOVENOX) injection  40 mg Subcutaneous Q24H  . ezetimibe  10 mg Oral Daily  . ferrous sulfate  325 mg Oral Q breakfast  . folic acid  400 mcg Oral Daily  . furosemide  80 mg Intravenous BID  . insulin aspart  0-15 Units Subcutaneous Q4H  . isosorbide mononitrate  60 mg Oral Daily  . lamoTRIgine  200 mg Oral Daily  . LORazepam  0.5 mg Intravenous Once  . LORazepam  1 mg Oral Once  . metolazone  5 mg Oral Daily  . multivitamin with minerals  1 tablet Oral Daily  . mycophenolate  500 mg Oral BID  . pantoprazole  40 mg Oral Q breakfast  . predniSONE  5 mg Oral Q breakfast  . pyridostigmine  60 mg Oral BID  . sodium chloride  3 mL Intravenous Q12H  . tacrolimus  5 mg Oral BID  . tamsulosin  0.8 mg Oral Daily  . Umeclidinium-Vilanterol  1 puff Inhalation Daily  . Vilazodone HCl  20 mg Oral Daily  . vitamin C  1,000 mg Oral Daily   Continuous Infusions:   Principal Problem:   Chronic diastolic heart failure Active Problems:   HYPERLIPIDEMIA-MIXED   CORONARY ATHEROSLERO UNSPEC TYPE BYPASS GRAFT    Campath-induced atrial fibrillation   COPD (chronic obstructive pulmonary disease) gold stage D.   DM (diabetes mellitus), type 2   Parkinson's disease   Acute respiratory failure    Time spent: 45 minutes    WOODS, CURTIS, J  [facility] Pager [phone]. If 7PM-7AM, please contact night-coverage at [email or web address], password [facility] 10/05/2012, 6:48 AM  LOS: 1 day

## 2012-10-06 ENCOUNTER — Inpatient Hospital Stay (HOSPITAL_COMMUNITY): Payer: Medicare Other

## 2012-10-06 DIAGNOSIS — I2789 Other specified pulmonary heart diseases: Secondary | ICD-10-CM

## 2012-10-06 DIAGNOSIS — I272 Pulmonary hypertension, unspecified: Secondary | ICD-10-CM | POA: Diagnosis present

## 2012-10-06 DIAGNOSIS — Z515 Encounter for palliative care: Secondary | ICD-10-CM

## 2012-10-06 LAB — COMPREHENSIVE METABOLIC PANEL
ALT: 6 U/L (ref 0–53)
AST: 12 U/L (ref 0–37)
Albumin: 2.6 g/dL — ABNORMAL LOW (ref 3.5–5.2)
Alkaline Phosphatase: 172 U/L — ABNORMAL HIGH (ref 39–117)
BUN: 43 mg/dL — ABNORMAL HIGH (ref 6–23)
CO2: 25 mEq/L (ref 19–32)
Calcium: 9.1 mg/dL (ref 8.4–10.5)
Chloride: 102 mEq/L (ref 96–112)
Creatinine, Ser: 1.28 mg/dL (ref 0.50–1.35)
GFR calc Af Amer: 63 mL/min — ABNORMAL LOW (ref >90)
GFR calc non Af Amer: 54 mL/min — ABNORMAL LOW (ref >90)
Glucose, Bld: 131 mg/dL — ABNORMAL HIGH (ref 70–99)
Potassium: 4.2 mEq/L (ref 3.5–5.1)
Sodium: 138 mEq/L (ref 135–145)
Total Bilirubin: 1.6 mg/dL — ABNORMAL HIGH (ref 0.3–1.2)
Total Protein: 6.8 g/dL (ref 6.0–8.3)

## 2012-10-06 LAB — GLUCOSE, CAPILLARY
Glucose-Capillary: 147 mg/dL — ABNORMAL HIGH (ref 70–99)
Glucose-Capillary: 196 mg/dL — ABNORMAL HIGH (ref 70–99)
Glucose-Capillary: 280 mg/dL — ABNORMAL HIGH (ref 70–99)

## 2012-10-06 LAB — MAGNESIUM: Magnesium: 1.7 mg/dL (ref 1.5–2.5)

## 2012-10-06 MED ORDER — PANTOPRAZOLE SODIUM 40 MG IV SOLR
40.0000 mg | Freq: Once | INTRAVENOUS | Status: AC
  Administered 2012-10-06: 40 mg via INTRAVENOUS

## 2012-10-06 MED ORDER — ALPRAZOLAM 0.5 MG PO TABS
1.0000 mg | ORAL_TABLET | ORAL | Status: DC | PRN
  Administered 2012-10-06 – 2012-10-08 (×2): 1 mg via ORAL
  Filled 2012-10-06: qty 2
  Filled 2012-10-06: qty 1
  Filled 2012-10-06 (×2): qty 2

## 2012-10-06 MED ORDER — CAMPHOR-MENTHOL 0.5-0.5 % EX LOTN
TOPICAL_LOTION | CUTANEOUS | Status: DC | PRN
  Filled 2012-10-06: qty 222

## 2012-10-06 MED ORDER — PANTOPRAZOLE SODIUM 40 MG PO TBEC
40.0000 mg | DELAYED_RELEASE_TABLET | Freq: Every day | ORAL | Status: DC
  Administered 2012-10-07 – 2012-10-10 (×4): 40 mg via ORAL
  Filled 2012-10-06 (×3): qty 1

## 2012-10-06 MED ORDER — ALPRAZOLAM 0.5 MG PO TABS
0.5000 mg | ORAL_TABLET | Freq: Two times a day (BID) | ORAL | Status: DC
  Administered 2012-10-06 – 2012-10-07 (×3): 0.5 mg via ORAL
  Filled 2012-10-06 (×2): qty 1
  Filled 2012-10-06: qty 2
  Filled 2012-10-06: qty 1

## 2012-10-06 MED ORDER — SENNOSIDES-DOCUSATE SODIUM 8.6-50 MG PO TABS
1.0000 | ORAL_TABLET | Freq: Every day | ORAL | Status: DC
  Administered 2012-10-06 – 2012-10-10 (×5): 1 via ORAL
  Filled 2012-10-06 (×10): qty 1

## 2012-10-06 MED ORDER — FUROSEMIDE 10 MG/ML IJ SOLN
120.0000 mg | Freq: Two times a day (BID) | INTRAVENOUS | Status: DC
  Administered 2012-10-06 – 2012-10-08 (×4): 120 mg via INTRAVENOUS
  Filled 2012-10-06 (×5): qty 12

## 2012-10-06 MED ORDER — HYDROMORPHONE HCL PF 1 MG/ML IJ SOLN
0.5000 mg | INTRAMUSCULAR | Status: DC | PRN
  Administered 2012-10-08: 0.5 mg via INTRAVENOUS
  Filled 2012-10-06: qty 1

## 2012-10-06 MED ORDER — INSULIN ASPART 100 UNIT/ML ~~LOC~~ SOLN
0.0000 [IU] | Freq: Three times a day (TID) | SUBCUTANEOUS | Status: DC
  Administered 2012-10-06 – 2012-10-07 (×2): 2 [IU] via SUBCUTANEOUS

## 2012-10-06 MED ORDER — MORPHINE SULFATE 2 MG/ML IJ SOLN: 2.0000 mg | Freq: Once | INTRAMUSCULAR | Status: DC

## 2012-10-06 MED ORDER — LORAZEPAM 1 MG PO TABS: 1.0000 mg | ORAL_TABLET | Freq: Four times a day (QID) | ORAL | Status: DC | PRN

## 2012-10-06 MED ORDER — HYDROMORPHONE HCL 2 MG PO TABS
2.0000 mg | ORAL_TABLET | Freq: Four times a day (QID) | ORAL | Status: DC
  Administered 2012-10-06 – 2012-10-07 (×4): 2 mg via ORAL
  Filled 2012-10-06 (×4): qty 1

## 2012-10-06 NOTE — Consult Note (Addendum)
Palliative Medicine Team at [facility]  Date: 10/06/2012   Patient Name: John [name].  DOB: 1940/07/05  MRN: [id number]  Age / Sex: 72 y.o., male   PCP: John Sauer, MD Referring Physician: Drema Dallas, MD  HPI/Reason for Consultation: 73 yo man with multiple chronic and end stage medical problems including Parkinson's Disease/Shy-Drager, Renal Transplantation and ASCVD admitted with acute heart failure and respiratory failure from pulmonary edema. He is currently off Bi-pap and is diuresising well. PMT asked to assist with GOC.  Participants in Discussion: Patient's daughter John [name] from [place], [place], his wife John [name]. Patient able to minimally participate, he was able to receive information but he had difficulty expressing himself. I met with wife and daughter first, Dr. Arta [name] was also present. We then attempted to discuss goals with John [name] but he became very agitated, SOB and really could participate without distress-he kept repeating the question "how long do I have", "Is this it?".  Goals/Summary of Discussion:  1. Code Status:  DNR, extensive discussion  2. Scope of Treatment:  Current focus is both comfort and full scope medical treatment-right now family and patient struggling with deciding acceptable level of interventions balanced with QOL and level of suffering.  Discussion with John [name] himself was quite difficult, he was extremely anxious and had difficulty focusing-I think he needs specific information on prognosis but in his current condition it is very difficult to prognosticate. He is clearly hospice appropriate with a prg <6 months and needs better symptom management in general.  Goals meeting was probably very therapeutic for his wife who was able to articulate and cope with her issues with his illness. She describe her struggle in role as caregiver, her attempts to be an advocate and also to be "cheerleader".   I described in detail various  disease trajectories and choices for how his care is provided including home with hospice, residential hospice and also continuation with aggressive medical care.  3. Assessment/Plan:  Primary Diagnoses  1. CHF 2. ASCVD 3. Renal Failure 4. Parkinson's Disease/Shy-Drager 5. PAH 6. COPD  Prognosis: <6 months   PPS 30%   Active Symptoms 1. Dyspnea 2. Immobility 3. Profound Weakness 4. Dysphagia 5. Failure to Thrive 6. Tremors 7. Pain, joints 8. Anxiety/Grief 9. Itching   Started scheduled hydromorphone very low dose oral/SL q6 hours for dyspnea  Alprazolam BID and q4 prn for anxiety/agitaton-prefer shorter half life  Sarna for itching  Maintain SSRI  Discontinued Multivitamins, Iron, Statins for now, will probably begin discontinuing other medicine based on evolving goals of care.  4. Palliative Prophylaxis:   Bowel Regimen- Senna-S  Breakthrough Pain and Dyspnea- Hydromorphone  Agitation and Delirium-Alprazolam  Nausea-Zofran PRN  5. Psychosocial Spiritual Asssessment/Interventions:  Patient and Family Adjustment to Illness/Prognosis: Coping is much improved, wife seems to have a much better understanding of choices and a new framework for understanding and reacting to his illness. Daughter very appropriate grief and coping-wants full comfort.  Spiritual Concerns or Needs: Suspect there is unspoken spiritual suffering.  6. Disposition: Depending on how he progresses in term of his diuresis, mental status and overall condition-I suspect he will eventually go home with hospice, I have however also prepared his family for the possibility that he may deteriorate rapidly or may need residential hospice for his care.  Social History:   reports that he quit smoking about 34 years ago. His smoking use included Cigarettes. He has a 50 pack-year smoking history. He has never used smokeless  tobacco. He reports that he does not drink alcohol or use illicit drugs. Living  Situation: Lives at home with his wife Occupation: Clinical research associate, retired  Family History: Family History  Problem Relation Age of Onset  . Coronary artery disease Mother   . Valvular heart disease Father   . Hepatitis      sibling  . Coronary artery disease Sister   . Heart attack Sister   . Tuberculosis Father     Active Medications:  Outpatient medications: Prescriptions prior to admission  Medication Sig Dispense Refill  . albuterol (PROVENTIL) (2.5 MG/3ML) 0.083% nebulizer solution Take 2.5 mg by nebulization every 6 (six) hours as needed for wheezing.      Marland Kitchen aspirin 81 MG tablet Take 81 mg by mouth daily.      Marland Kitchen atorvastatin (LIPITOR) 40 MG tablet Take 40 mg by mouth daily.       . calcium carbonate (OS-CAL - DOSED IN MG OF ELEMENTAL CALCIUM) 1250 MG tablet Take 2 tablets by mouth daily.      . carbidopa-levodopa (SINEMET CR) 50-200 MG per tablet Take 0.5-1 tablets by mouth 2 (two) times daily. Take one-half tablet in the morning,One tablet at 11 am before lunchtime,and 1 tablet at 5 pm before dinner.      . Cholecalciferol (VITAMIN D-3) 5000 UNITS TABS Take 2 tablets by mouth 3 (three) times a week.      . clopidogrel (PLAVIX) 75 MG tablet Take 75 mg by mouth daily.      Marland Kitchen ezetimibe (ZETIA) 10 MG tablet Take 10 mg by mouth daily.       . Ferrous Sulfate (IRON) 325 (65 FE) MG TABS Take 1 tablet by mouth daily.       . folic acid (FOLVITE) 400 MCG tablet Take 400 mcg by mouth daily.      . isosorbide mononitrate (IMDUR) 60 MG 24 hr tablet Take 60 mg by mouth daily.      Marland Kitchen lamoTRIgine (LAMICTAL) 200 MG tablet Take 200 mg by mouth daily.      . Multiple Vitamin (MULITIVITAMIN WITH MINERALS) TABS Take 1 tablet by mouth daily.      . mycophenolate (CELLCEPT) 500 MG tablet Take 500 mg by mouth 2 (two) times daily.       . pantoprazole (PROTONIX) 40 MG tablet Take 40 mg by mouth daily.      . predniSONE (DELTASONE) 5 MG tablet Take 5 mg by mouth daily. HOLD while on 10mg  prednisone then  resume when 10mg  is completed      . pyridostigmine (MESTINON) 60 MG tablet Take 60 mg by mouth 2 (two) times daily. Take 2 tablets by mouth in the morning, and take 1 tablet by mouth in the afternoon.      . tacrolimus (PROGRAF) 5 MG capsule Take 5 mg by mouth 2 (two) times daily.       . Tamsulosin HCl (FLOMAX) 0.4 MG CAPS Take 0.8 mg by mouth daily.       . traMADol (ULTRAM) 50 MG tablet Take 50 mg by mouth as needed for pain.       Marland Kitchen Umeclidinium-Vilanterol 62.5-25 MCG/INH AEPB Inhale 1 puff into the lungs daily.      . Vilazodone HCl (VIIBRYD) 40 MG TABS Take 20 mg by mouth daily.       . vitamin C (ASCORBIC ACID) 500 MG tablet Take 1,000 mg by mouth daily.       . [DISCONTINUED] eszopiclone (LUNESTA) 2 MG TABS  Take 2 mg by mouth at bedtime. Take immediately before bedtime      . [DISCONTINUED] furosemide (LASIX) 40 MG tablet Take 120 mg by mouth daily. Pt will be taking 120 mg.      . [DISCONTINUED] insulin aspart (NOVOLOG) 100 UNIT/ML injection by Continuous infusion (non-IV) route. Administered via insulin pump      . [DISCONTINUED] Nebulizers (COMPRESSOR/NEBULIZER) MISC Use with albuterol      . albuterol (PROVENTIL HFA;VENTOLIN HFA) 108 (90 BASE) MCG/ACT inhaler Inhale 2 puffs into the lungs every 6 (six) hours as needed for wheezing or shortness of breath.  1 Inhaler  4    Current medications: Infusions:    Scheduled Medications: . albuterol  5 mg Nebulization Once  . ALPRAZolam  0.5 mg Oral BID  . aspirin EC  81 mg Oral Daily  . calcium carbonate  2 tablet Oral Daily  . carbidopa-levodopa  1 tablet Oral Custom  . carbidopa-levodopa  1 tablet Oral QAC breakfast  . clopidogrel  75 mg Oral Q breakfast  . furosemide  120 mg Intravenous BID  . HYDROmorphone  2 mg Oral Q6H  . insulin aspart  0-9 Units Subcutaneous TID WC  . isosorbide mononitrate  60 mg Oral Daily  . lamoTRIgine  200 mg Oral Daily  . LORazepam  1 mg Oral Once  . methylPREDNISolone (SOLU-MEDROL) injection  4 mg  Intravenous Daily  . metolazone  5 mg Oral Daily  .  morphine injection  2 mg Intravenous Once  . mycophenolate  500 mg Oral BID  . pantoprazole (PROTONIX) IV  40 mg Intravenous Q24H  . sodium chloride  3 mL Intravenous Q12H  . tacrolimus  5 mg Oral BID  . tamsulosin  0.8 mg Oral Daily  . Umeclidinium-Vilanterol  1 puff Inhalation Daily  . Vilazodone HCl  20 mg Oral Daily  . vitamin C  1,000 mg Oral Daily    PRN Medications: sodium chloride, acetaminophen, albuterol, ALPRAZolam, morphine injection, ondansetron (ZOFRAN) IV, sodium chloride   Vital Signs: BP 147/57  Pulse 82  Temp(Src) 98.6 F (37 C) (Oral)  Resp 32  Ht 6\' 2"  (1.88 m)  Wt 92 kg (202 lb 13.2 oz)  BMI 26.03 kg/m2  SpO2 96%   Physical Exam:  Very edematous, drowsy, chronically ill appearing, scratching his skin.  Labs:  Basic or Comprehensive Metabolic Panel:    Component Value Date/Time   NA 138 10/06/2012 0434   K 4.2 10/06/2012 0434   CL 102 10/06/2012 0434   CO2 25 10/06/2012 0434   BUN 43* 10/06/2012 0434   CREATININE 1.28 10/06/2012 0434   GLUCOSE 131* 10/06/2012 0434   CALCIUM 9.1 10/06/2012 0434   AST 12 10/06/2012 0434   ALT 6 10/06/2012 0434   ALKPHOS 172* 10/06/2012 0434   BILITOT 1.6* 10/06/2012 0434   PROT 6.8 10/06/2012 0434   ALBUMIN 2.6* 10/06/2012 0434     CBC:    Component Value Date/Time   WBC 6.6 10/04/2012 2036   HGB 10.3* 10/04/2012 2036   HCT 33.0* 10/04/2012 2036   PLT 132* 10/04/2012 2036   MCV 80.7 10/04/2012 2036   NEUTROABS 6.9 10/02/2012 1702   LYMPHSABS 0.5* 10/02/2012 1702   MONOABS 0.3 10/02/2012 1702   EOSABS 0.1 10/02/2012 1702   BASOSABS 0.0 10/02/2012 1702     BNP (last 3 results)  Recent Labs  05/20/12 0926 10/02/12 1702 10/04/12 1146  PROBNP 513.0* 662.0* 6763.0*    CBG (last 3)   Recent Labs  10/05/12 1715 10/05/12 2200 10/06/12 0748  GLUCAP 225* 176* 147*    Imaging:  Dg Chest Port 1 View  10/06/2012   *RADIOLOGY REPORT*  Clinical Data: Edema.  PORTABLE  CHEST - 1 VIEW  Comparison: 10/05/2012  Findings: Patchy airspace densities, right side greater than left. Findings could represent asymmetric pulmonary edema and/or infection.  Heart size is grossly stable.  No evidence for pneumothorax.  IMPRESSION: Minimal change in the bilateral airspace disease, right side greater than left.   Original Report Authenticated By: Richarda Overlie, M.D.   Dg Chest Port 1 View  10/05/2012   *RADIOLOGY REPORT*  Clinical Data: Pulmonary edema.  Evaluate for respiratory status and lung status.  PORTABLE CHEST - 1 VIEW  Comparison: 10/04/2012.  Findings: Patchy bilateral airspace lung opacities are similar to the previous day's exam.  There is additional opacity at the right lung base consistent with atelectasis and a small effusion.  This is also stable.  There is no pneumothorax.  Changes from CABG surgery are stable.  The cardiac silhouette is normal in size.  IMPRESSION: Persistent bilateral airspace lung opacities with additional right lung base pleural fluid and atelectasis.  There is been no change from prior exam. The bilateral lung opacities are consistent with pulmonary edema.   Original Report Authenticated By: Amie Portland, M.D.    Other Data:  (2D echo, EKG...)   Educational Materials Given:  DNR: Yes  MOST: No Healthcare Power-of-Attorney: Yes    Time: 90 minutes Greater than 50%  of this time was spent counseling and coordinating care related to the above assessment and plan.  Signed by: Edsel Petrin, DO  10/06/2012, 12:45 PM  Please contact Palliative Medicine Team phone at [phone] for questions and concerns.

## 2012-10-06 NOTE — Progress Notes (Signed)
Renal Service Daily Progress Note  Subjective: 5L out yest, off of bipap  Physical Exam:  Blood pressure 147/57, pulse 82, temperature 98.6 F (37 C), temperature source Oral, resp. rate 32, height 6\' 2"  (1.88 m), weight 92 kg (202 lb 13.2 oz), SpO2 96.00%. Gen: off bipap Neck: +JVD  Chest: rales at bases bilat CV: regular, 2-3/6 SEM RUSB, no rub or gallop  Abdomen: soft, mod obese, liver down 4cm, no ascites, nontender  Ext: 3+ bilat LE edema Neuro: gen weakness  Impression:  1. Acute on chronic resp failure- diuresing 2. COPD / home O2 3. Renal transplant 2002 baseline creat 1.0-1.6 4. Combined syst/diast heart failure, EF 40-45% 5. Recurrent loculated R pleural effusion 6. Parkinson's disease 7. Chronic debility 8. CAD w CABG 2011   Plan- reduce diuretics slightly, will follow  Vinson Moselle  MD Pager [phone]    Cell  [phone] 10/06/2012, 10:58 AM    Recent Labs Lab 10/04/12 1146 10/04/12 2036 10/05/12 0500 10/06/12 0434  NA 136  --  138 138  K 5.1  --  5.0 4.2  CL 104  --  106 102  CO2 21  --  25 25  GLUCOSE 92  --  168* 131*  BUN 44*  --  44* 43*  CREATININE 1.39* 1.44* 1.38* 1.28  CALCIUM 9.4  --  9.2 9.1    Recent Labs Lab 10/05/12 0500 10/06/12 0434  AST 14 12  ALT <5 6  ALKPHOS 168* 172*  BILITOT 1.2 1.6*  PROT 6.3 6.8  ALBUMIN 2.5* 2.6*    Recent Labs Lab 10/02/12 1702 10/04/12 1146 10/04/12 2036  WBC 7.7 8.4 6.6  NEUTROABS 6.9  --   --   HGB 10.2* 10.7* 10.3*  HCT 32.0* 33.1* 33.0*  MCV 79.7 79.6 80.7  PLT 141.0* 126* 132*   . albuterol  5 mg Nebulization Once  . aspirin EC  81 mg Oral Daily  . atorvastatin  40 mg Oral q1800  . calcium carbonate  2 tablet Oral Daily  . carbidopa-levodopa  1 tablet Oral Custom  . carbidopa-levodopa  1 tablet Oral QAC breakfast  . clopidogrel  75 mg Oral Q breakfast  . enoxaparin (LOVENOX) injection  40 mg Subcutaneous Q24H  . ezetimibe  10 mg Oral Daily  . ferrous sulfate  325 mg Oral Q  breakfast  . folic acid  1 mg Intravenous Daily  . furosemide  160 mg Intravenous Custom  . insulin aspart  0-15 Units Subcutaneous TID WC  . isosorbide mononitrate  60 mg Oral Daily  . lamoTRIgine  200 mg Oral Daily  . LORazepam  1 mg Oral Once  . methylPREDNISolone (SOLU-MEDROL) injection  4 mg Intravenous Daily  . metolazone  5 mg Oral Daily  . multivitamin with minerals  1 tablet Oral Daily  . mycophenolate  500 mg Oral BID  . pantoprazole (PROTONIX) IV  40 mg Intravenous Q24H  . sodium chloride  3 mL Intravenous Q12H  . tacrolimus  5 mg Oral BID  . tamsulosin  0.8 mg Oral Daily  . Umeclidinium-Vilanterol  1 puff Inhalation Daily  . Vilazodone HCl  20 mg Oral Daily  . vitamin C  1,000 mg Oral Daily     sodium chloride, acetaminophen, albuterol, morphine injection, ondansetron (ZOFRAN) IV, sodium chloride

## 2012-10-06 NOTE — Progress Notes (Signed)
[facility] PROGRESS NOTE  John [name]. QMV:[id number] DOB: [birth year] DOA: 10/04/2012 PCP: Hoyle Sauer, MD  Assessment/Plan: 1. CHF; reviewed films with patient's nurse and wife patient has rare pulmonary edema, we'll aggressively diuresis patient. Continue 160 mg Lasix +5 mg Zaroxolyn. We'll most likely place patient on Lasix drip in the a.m. may continue the Zaroxolyn if patient's creatinine does not significantly increase MUST REMEMBER patient is a renal transplant patient and only has one kidney. Daily a.m. weights, strict I.'s and O.'s Obtained 2-D echocardiogram the results below. NOTE; cardiology on board patient was seen by Willa Rough, MD, [facility]) in the ED. troponin x 3 within normal limits. Admission weight= 250lbs, current weight= 202lbs     2. Respiratory failure; see #1   3. Renal transplant; reviewed list of medications with patient's wife patient has had at least one dose of all of his immunosuppressant medications. It was agreed patient with missed his evening dose of immunosuppressants secondary to remaining on BiPAP for now. Converted on medication to IV which could be converted, held all the by mouth medication except for immunosuppressant, Parkinson's medication, cardiac medication. Counseled the nephrology Dr. Delano Metz for help with aggressive diuresis the patient with one kidney.  Increased IV Lasix to 160 mg every 8 hours+ 5 mg Zaroxolyn daily. Admission weight= 215lbs, current weight= 212lbs  admission creatinine = 1.44 , current creatinine = 1.28 , baseline creatinine = approximately is 0.9 continue strict I.'s and O.'s, daily a.m. weights  4. COPD; patient currently has medication written when necessary for COPD however owing to his present pulmonary edema would not be able to tolerate nebulizers.   5. Parkinson's; restart patient's Sinemet today  6. Diabetes type 2 on insulin pump; insulin pump was disconnected and taken home Will  control patient's diabetes through SSI   7. Atrial Fib; currently in NSR, rate controlled, only on aspirin for anticoagulation  Code Status: Full Family Communication: Wife at bedside to discuss plan of care Disposition Plan:    Consultants:  nephrology Dr. Delano Metz  Procedure  CXR 10/04/2012  Diffuse slightly asymmetric air space disease similar to the recent  examination. Question pulmonary edema versus infectious  infiltrate. Recommend follow-up until clearance.  Cardiomegaly post CABG.  Calcified aorta.  Echocardiogram 10/05/2012 - Comparison to prior study March 2014. There is mild LVH with LVEF 55%, mld inferior hypokinesis, indeterminant diastolic function. MIld biatrial enlargement. Overall moderate aortic stenosis, functionally bicuspid valve - similar gradients. RV enlargement and dysfunction with PASP now increased to 64 mmHg, CVP elevated.      Antibiotics:   (indicate start date, and stop date if known)  HPI/Subjective: John Sliney. is a 72 y.o. 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  With regard to history, he had a long and complicated hospital admission in 1/11 when was admitted with pulm edema/NSTEMI. Stress test was abnormal so he went on to have cath showing severe disease involving the LM, LAD, ramus, CFX and RCA. He underwent CABG x 5. Post-op course was complicated by pulmonary  edema requiring intubation, renal  failure requiring CVVH, and atrial fibrillation. He was in the hospital about 7 weeks. In 11/11, patient had right SFA and popliteal atherectomy that was complicated by a left groin pseudoaneurysm. In the fall of 2012, John [name] reported worsening orthostatic-type lightheadedness and worsening exertional dyspnea. Dr. Shirlee Latch decided at that point to take him for right and left heart cath in 10/12. On Lasix, his right and left heart filling pressures were not significantly elevated. His bypass grafts were patent but he had an ungrafted PDA with 95% stenosis (small vessel that would not be amenable to PCI). As this was a possible source of ischemia and could potentially cause exertional dyspnea (his anginal equivalent was dyspnea in the past), Dr. Shirlee Latch started him on Imdur and took him off lisinopril due to worsening orthostatic symptoms. He has been on pyridostigmine for orthostatic hypotension with supine hypertension. In 3/13, John [name] had back surgery. Post-operative course was complicated by fluid retention and respiratory failure requiring intubation. He developed PNA and delirium. He had a prolonged hospitalization and rehabilitation. He has been having difficulty walking due to poor balance and right foot drop. He has seen neurology and was diagnosed with Parkinson's. He is wearing a brace which has helped with the foot drop. Last echo in 3/14 showed normal EF, moderate LVH, and moderate aortic stenosis.  More recently, the patient underwent R thoracentesis 09/15/12 for loculated pleural effusion. He was also recently completed a course of Augmentin and prednisone taper for dyspnea. Over the past 10 days, however, he has developed significant DOE, dry cough, LEE, and increase in abdominal girth. Decreased UOP per wife - pt hasn't noticed. He was evaluated in the pulmonary office 2 days ago at which time Lasix was increased to 80mg  BID. LE dopplers were negative for DVT. He saw Dr. Abel Presto yesterday and  K was discontinued due to level 5.7, prednisone was decreased from 10->5mg , and Lasix was increased to 120mg  BID. He took 2 doses of that yesterday and 1 dose this AM, but was still significantly SOB so his wife drove him to the ER. He was hypoxic at 87% on [place] thus was placed on bipap. He has been intermittently bradycardic down to the mid 30's but is currently maintaining 40s-60s. He is in [place]. At times when dropping slower he has a 2:1 pattern but per discussion with Dr. Graciela Husbands with EP, this is likely [place] as well given surrounding rhythm. He is still mentating well and BP is stable. Please note in his chart the vitals show HR in 120's but this is an error. He has not been tachycardic this admission. pBNP 6763, troponin neg x 1, BUN/Cr 44/1.39, K 5.1, Hgb 10.7/plt 126. CXR shows diffuse slightly asymmetric air space disease similar to the recent examination, question pulmonary edema versus infectious infiltrate, cardiomegaly post CABG, calcified aorta. (CXR on 8/13 was read as moderate layering/loculated right pleural effusion.) He denies any fever, chills, orthopnea, CP, palpitations, or syncope. Difficult to weigh regularly at home due to balance, but weight is stable today compared to last in 08/2012 and down from May/June. He received 80mg  IV Lasix and atrovent/proventil neb in the ER and is beginning to have good UOP. Patient seen by Willa Rough, MD,([facility]) in the ED. Patient initially was admitted to 4 E. rapid response team was called when patient into respiratory distress and patient was transferred to 2900. Currently patient still feeling short of breath while on BiPAP, hypertensive, respiration rate in the high  20s -30s. Most of this history was obtained from the wife was at his bedside. TODAY states is much more comfortable breathing. Patient on rebreather mask and resting comfortably  Objective: Filed Vitals:   10/06/12 0507 10/06/12 0508 10/06/12 0600 10/06/12 0745  BP:     147/57  Pulse:    82  Temp:    98.6 F (37 C)  TempSrc:    Oral  Resp:      Height:      Weight:   92 kg (202 lb 13.2 oz)   SpO2: 86% 96% 100% 96%    Intake/Output Summary (Last 24 hours) at 10/06/12 1041 Last data filed at 10/06/12 0600  Gross per 24 hour  Intake    132 ml  Output   4500 ml  Net  -4368 ml   Filed Weights   10/04/12 1822 10/04/12 2300 10/06/12 0600  Weight: 96.2 kg (212 lb 1.3 oz) 96.6 kg (212 lb 15.4 oz) 92 kg (202 lb 13.2 oz)    Exam:   General:  Alert, work of breathing much improved, sleeping but easily arousable and answers questions appropriately  Cardiovascular: Irregular irregular rhythm, positive holosystolic murmur, negative rubs or gallops, DP/PT pulse nonpalpable  Respiratory: Clear to auscultation bilateral apices, mid/lower lung lobes continue to have diffuse rales although clearing compared to yesterday  Abdomen: Soft nontender nondistended plus bowel sounds  Musculoskeletal: Continued bilateral pedal edema 3+ to midcalf    Data Reviewed: Basic Metabolic Panel:  Recent Labs Lab 10/02/12 1702 10/04/12 1146 10/04/12 2036 10/05/12 0500 10/06/12 0434  NA 136 136  --  138 138  K 5.9* 5.1  --  5.0 4.2  CL 107 104  --  106 102  CO2 22 21  --  25 25  GLUCOSE 181* 92  --  168* 131*  BUN 42* 44*  --  44* 43*  CREATININE 1.5 1.39* 1.44* 1.38* 1.28  CALCIUM 8.9 9.4  --  9.2 9.1  MG  --   --   --  1.9 1.7   Liver Function Tests:  Recent Labs Lab 10/05/12 0500 10/06/12 0434  AST 14 12  ALT <5 6  ALKPHOS 168* 172*  BILITOT 1.2 1.6*  PROT 6.3 6.8  ALBUMIN 2.5* 2.6*   No results found for this basename: LIPASE, AMYLASE,  in the last 168 hours No results found for this basename: AMMONIA,  in the last 168 hours CBC:  Recent Labs Lab 10/02/12 1702 10/04/12 1146 10/04/12 2036  WBC 7.7 8.4 6.6  NEUTROABS 6.9  --   --   HGB 10.2* 10.7* 10.3*  HCT 32.0* 33.1* 33.0*  MCV 79.7 79.6 80.7  PLT 141.0* 126* 132*   Cardiac  Enzymes:  Recent Labs Lab 10/05/12 0050 10/05/12 0500 10/05/12 1435  TROPONINI <0.30 <0.30 <0.30   BNP (last 3 results)  Recent Labs  05/20/12 0926 10/02/12 1702 10/04/12 1146  PROBNP 513.0* 662.0* 6763.0*   CBG:  Recent Labs Lab 10/05/12 0748 10/05/12 1147 10/05/12 1715 10/05/12 2200 10/06/12 0748  GLUCAP 95 110* 225* 176* 147*    Recent Results (from the past 240 hour(s))  URINE CULTURE     Status: None   Collection Time    10/04/12  2:36 PM      Result Value Range Status   Specimen Description URINE, CATHETERIZED   Final   Special Requests NONE   Final   Culture  Setup Time     Final   Value: 10/04/2012 16:21  Performed at [facility] Count     Final   Value: >=100,000 COLONIES/ML     Performed at [facility]   Culture     Final   Value: GRAM NEGATIVE RODS     Performed at [facility]   Report Status PENDING   Incomplete     Studies: Dg Chest Port 1 View  10/06/2012   *RADIOLOGY REPORT*  Clinical Data: Edema.  PORTABLE CHEST - 1 VIEW  Comparison: 10/05/2012  Findings: Patchy airspace densities, right side greater than left. Findings could represent asymmetric pulmonary edema and/or infection.  Heart size is grossly stable.  No evidence for pneumothorax.  IMPRESSION: Minimal change in the bilateral airspace disease, right side greater than left.   Original Report Authenticated By: Richarda Overlie, M.D.   Dg Chest Port 1 View  10/05/2012   *RADIOLOGY REPORT*  Clinical Data: Pulmonary edema.  Evaluate for respiratory status and lung status.  PORTABLE CHEST - 1 VIEW  Comparison: 10/04/2012.  Findings: Patchy bilateral airspace lung opacities are similar to the previous day's exam.  There is additional opacity at the right lung base consistent with atelectasis and a small effusion.  This is also stable.  There is no pneumothorax.  Changes from CABG surgery are stable.  The cardiac silhouette is normal in size.  IMPRESSION:  Persistent bilateral airspace lung opacities with additional right lung base pleural fluid and atelectasis.  There is been no change from prior exam. The bilateral lung opacities are consistent with pulmonary edema.   Original Report Authenticated By: Amie Portland, M.D.   Dg Chest Port 1 View  10/04/2012   *RADIOLOGY REPORT*  Clinical Data: Shortness of breath.  PORTABLE CHEST - 1 VIEW  Comparison: 10/02/2012.  Findings: Diffuse slightly asymmetric air space disease similar to the recent examination.  Question pulmonary edema versus infectious infiltrate.  Cardiomegaly post CABG.  Calcified aorta.  IMPRESSION: Diffuse slightly asymmetric air space disease similar to the recent examination.  Question pulmonary edema versus infectious infiltrate. Recommend follow-up until clearance.  Cardiomegaly post CABG.  Calcified aorta.   Original Report Authenticated By: Lacy Duverney, M.D.    Scheduled Meds: . albuterol  5 mg Nebulization Once  . aspirin EC  81 mg Oral Daily  . atorvastatin  40 mg Oral q1800  . calcium carbonate  2 tablet Oral Daily  . carbidopa-levodopa  1 tablet Oral Custom  . carbidopa-levodopa  1 tablet Oral QAC breakfast  . clopidogrel  75 mg Oral Q breakfast  . enoxaparin (LOVENOX) injection  40 mg Subcutaneous Q24H  . ezetimibe  10 mg Oral Daily  . ferrous sulfate  325 mg Oral Q breakfast  . folic acid  1 mg Intravenous Daily  . furosemide  160 mg Intravenous Custom  . insulin aspart  0-15 Units Subcutaneous TID WC  . isosorbide mononitrate  60 mg Oral Daily  . lamoTRIgine  200 mg Oral Daily  . LORazepam  1 mg Oral Once  . methylPREDNISolone (SOLU-MEDROL) injection  4 mg Intravenous Daily  . metolazone  5 mg Oral Daily  . multivitamin with minerals  1 tablet Oral Daily  . mycophenolate  500 mg Oral BID  . pantoprazole (PROTONIX) IV  40 mg Intravenous Q24H  . sodium chloride  3 mL Intravenous Q12H  . tacrolimus  5 mg Oral BID  . tamsulosin  0.8 mg Oral Daily  .  Umeclidinium-Vilanterol  1 puff Inhalation Daily  . Vilazodone HCl  20  mg Oral Daily  . vitamin C  1,000 mg Oral Daily   Continuous Infusions:   Principal Problem:   Chronic diastolic heart failure Active Problems:   HYPERLIPIDEMIA-MIXED   CORONARY ATHEROSLERO UNSPEC TYPE BYPASS GRAFT   Campath-induced atrial fibrillation   COPD (chronic obstructive pulmonary disease) gold stage D.   DM (diabetes mellitus), type 2   Parkinson's disease   Acute respiratory failure    Time spent: 45 minutes    WOODS, CURTIS, J  [facility] Pager [phone]. If 7PM-7AM, please contact night-coverage at [email or web address], password [facility] 10/06/2012, 10:41 AM  LOS: 2 days

## 2012-10-06 NOTE — Progress Notes (Signed)
Primary cardiologist: Dr. [name]  Subjective:   Resting this morning, more comfortable than yesterday.   Objective:   Temp:  [97.3 F (36.3 C)-98.6 F (37 C)] 98.6 F (37 C) (08/17 0745) Pulse Rate:  [69-132] 82 (08/17 0745) Resp:  [25-34] 32 (08/17 0400) BP: (118-160)/(53-96) 147/57 mmHg (08/17 0745) SpO2:  [70 %-100 %] 96 % (08/17 0745) FiO2 (%):  [40 %-100 %] 50 % (08/17 0600) Weight:  [202 lb 13.2 oz (92 kg)] 202 lb 13.2 oz (92 kg) (08/17 0600) Last BM Date: 10/03/12  Filed Weights   10/04/12 1822 10/04/12 2300 10/06/12 0600  Weight: 212 lb 1.3 oz (96.2 kg) 212 lb 15.4 oz (96.6 kg) 202 lb 13.2 oz (92 kg)    Intake/Output Summary (Last 24 hours) at 10/06/12 1002 Last data filed at 10/06/12 0600  Gross per 24 hour  Intake    198 ml  Output   4500 ml  Net  -4302 ml   Telemetry: Sinus rhythm and bradycardia.  Exam:  General: Chronically ill-appearing. No acute distress.  Lungs: Coarse breath sounds, rhonchi, diminished on the right with some egophony.  Cardiac: RRR with ectopy, 3/6 systolic murmur at the base.  Extremities: Chronic appearing edema, significant.  Lab Results:  Basic Metabolic Panel:  Recent Labs Lab 10/04/12 1146 10/04/12 2036 10/05/12 0500 10/06/12 0434  NA 136  --  138 138  K 5.1  --  5.0 4.2  CL 104  --  106 102  CO2 21  --  25 25  GLUCOSE 92  --  168* 131*  BUN 44*  --  44* 43*  CREATININE 1.39* 1.44* 1.38* 1.28  CALCIUM 9.4  --  9.2 9.1  MG  --   --  1.9 1.7    Liver Function Tests:  Recent Labs Lab 10/05/12 0500 10/06/12 0434  AST 14 12  ALT <5 6  ALKPHOS 168* 172*  BILITOT 1.2 1.6*  PROT 6.3 6.8  ALBUMIN 2.5* 2.6*    CBC:  Recent Labs Lab 10/02/12 1702 10/04/12 1146 10/04/12 2036  WBC 7.7 8.4 6.6  HGB 10.2* 10.7* 10.3*  HCT 32.0* 33.1* 33.0*  MCV 79.7 79.6 80.7  PLT 141.0* 126* 132*    Cardiac Enzymes:  Recent Labs Lab 10/05/12 0050 10/05/12 0500 10/05/12 1435  TROPONINI <0.30 <0.30  <0.30    BNP:  Recent Labs  05/20/12 0926 10/02/12 1702 10/04/12 1146  PROBNP 513.0* 662.0* 6763.0*   Echocardiogram 8/16: Study Conclusions  - Left ventricle: The cavity size was normal. There was mild concentric hypertrophy. Systolic function was normal. The estimated ejection fraction was in the range of 55% to 60%. There is mild hypokinesis of the basal-midinferior myocardium. The study is not technically sufficient to allow evaluation of LV diastolic function. - Aortic valve: Moderately calcified annulus and leaflets. Functionally bicuspid with fusion of the left and noncoronary cusps. Cusp separation was reduced. There was moderate stenosis. Mean gradient: 20mm Hg (S). Peak velocity ratio of LVOT to aortic valve: 0.3. Valve area: 1.15cm^2 (Vmax). - Mitral valve: Mildly thickened leaflets . Trivial regurgitation. - Left atrium: The atrium was mildly dilated. - Right ventricle: The cavity size was moderately dilated. Systolic function was moderately reduced. - Right atrium: The atrium was mildly dilated. Central venous pressure: 15mm Hg (est). - Tricuspid valve: Mild regurgitation. - Pulmonary arteries: Systolic pressure was moderately to severely increased. PA peak pressure: 64mm Hg (S). - Pericardium, extracardiac: There was no pericardial effusion. Impressions:  - Comparison to  prior study March 2014. There is mild LVH with LVEF 55%, mld inferior hypokinesis, indeterminant diastolic function. MIld biatrial enlargement. Overall moderate aortic stenosis, functionally bicuspid valve - similar gradients. RV enlargement and dysfunction with PASP now increased to 64 mmHg, CVP elevated.    Medications:   Scheduled Medications: . albuterol  5 mg Nebulization Once  . aspirin EC  81 mg Oral Daily  . atorvastatin  40 mg Oral q1800  . calcium carbonate  2 tablet Oral Daily  . carbidopa-levodopa  1 tablet Oral Custom  . carbidopa-levodopa  1 tablet Oral QAC  breakfast  . clopidogrel  75 mg Oral Q breakfast  . enoxaparin (LOVENOX) injection  40 mg Subcutaneous Q24H  . ezetimibe  10 mg Oral Daily  . ferrous sulfate  325 mg Oral Q breakfast  . folic acid  1 mg Intravenous Daily  . furosemide  160 mg Intravenous Custom  . insulin aspart  0-15 Units Subcutaneous TID WC  . isosorbide mononitrate  60 mg Oral Daily  . lamoTRIgine  200 mg Oral Daily  . LORazepam  1 mg Oral Once  . methylPREDNISolone (SOLU-MEDROL) injection  4 mg Intravenous Daily  . metolazone  5 mg Oral Daily  . multivitamin with minerals  1 tablet Oral Daily  . mycophenolate  500 mg Oral BID  . pantoprazole (PROTONIX) IV  40 mg Intravenous Q24H  . sodium chloride  3 mL Intravenous Q12H  . tacrolimus  5 mg Oral BID  . tamsulosin  0.8 mg Oral Daily  . Umeclidinium-Vilanterol  1 puff Inhalation Daily  . Vilazodone HCl  20 mg Oral Daily  . vitamin C  1,000 mg Oral Daily      PRN Medications:  sodium chloride, acetaminophen, albuterol, morphine injection, ondansetron (ZOFRAN) IV, sodium chloride   Assessment:   1. Acute on chronic diastolic heart failure complicated by concurrent moderate aortic stenosis, underlying COPD, and loculated right pleural effusion. He is off BiPAP, had a substantial diuresis within the last 24 hours. Echocardiogram is relatively stable as outlined above.  2. History of CAD status post CABG. No clear evidence of ACS.  3. Moderate aortic stenosis.  4. Hypertension. On Imdur long at this time. He had prior hypotension on ACE inhibitor/ARB as well as recent hyperkalemia with change in renal function. Avoiding AV nodal blocking agents at this time.   Plan/Discussion:    Continue current diuretic plan. I note that the patient's wife and daughter will be meeting with the Palliative Care team today to discuss goals.   Jonelle Sidle, M.D., F.A.C.C.

## 2012-10-07 ENCOUNTER — Telehealth: Payer: Self-pay | Admitting: Critical Care Medicine

## 2012-10-07 ENCOUNTER — Inpatient Hospital Stay (HOSPITAL_COMMUNITY): Payer: Medicare Other

## 2012-10-07 LAB — MAGNESIUM: Magnesium: 1.5 mg/dL (ref 1.5–2.5)

## 2012-10-07 LAB — GLUCOSE, CAPILLARY
Glucose-Capillary: 180 mg/dL — ABNORMAL HIGH (ref 70–99)
Glucose-Capillary: 178 mg/dL — ABNORMAL HIGH (ref 70–99)
Glucose-Capillary: 189 mg/dL — ABNORMAL HIGH (ref 70–99)

## 2012-10-07 LAB — COMPREHENSIVE METABOLIC PANEL
ALT: 5 U/L (ref 0–53)
AST: 10 U/L (ref 0–37)
Albumin: 2.4 g/dL — ABNORMAL LOW (ref 3.5–5.2)
Alkaline Phosphatase: 168 U/L — ABNORMAL HIGH (ref 39–117)
BUN: 41 mg/dL — ABNORMAL HIGH (ref 6–23)
CO2: 30 mEq/L (ref 19–32)
Calcium: 9.3 mg/dL (ref 8.4–10.5)
Chloride: 99 mEq/L (ref 96–112)
Creatinine, Ser: 1.16 mg/dL (ref 0.50–1.35)
GFR calc Af Amer: 71 mL/min — ABNORMAL LOW (ref >90)
GFR calc non Af Amer: 61 mL/min — ABNORMAL LOW (ref >90)
Glucose, Bld: 201 mg/dL — ABNORMAL HIGH (ref 70–99)
Potassium: 4 mEq/L (ref 3.5–5.1)
Sodium: 138 mEq/L (ref 135–145)
Total Bilirubin: 1.8 mg/dL — ABNORMAL HIGH (ref 0.3–1.2)
Total Protein: 6.8 g/dL (ref 6.0–8.3)

## 2012-10-07 LAB — URINE CULTURE

## 2012-10-07 MED ORDER — HYDROMORPHONE HCL 2 MG PO TABS
2.0000 mg | ORAL_TABLET | Freq: Two times a day (BID) | ORAL | Status: DC
  Administered 2012-10-07 – 2012-10-10 (×5): 2 mg via ORAL
  Filled 2012-10-07 (×6): qty 1

## 2012-10-07 MED ORDER — HYDROMORPHONE HCL 2 MG PO TABS: 2.0000 mg | ORAL_TABLET | Freq: Two times a day (BID) | ORAL | Status: DC | PRN

## 2012-10-07 MED ORDER — POTASSIUM CHLORIDE CRYS ER 20 MEQ PO TBCR
40.0000 meq | EXTENDED_RELEASE_TABLET | Freq: Once | ORAL | Status: AC
  Administered 2012-10-07: 40 meq via ORAL
  Filled 2012-10-07: qty 2

## 2012-10-07 MED ORDER — CIPROFLOXACIN HCL 500 MG PO TABS: 500.0000 mg | ORAL_TABLET | Freq: Two times a day (BID) | ORAL | Status: DC

## 2012-10-07 MED ORDER — MAGNESIUM GLUCONATE 500 MG PO TABS
500.0000 mg | ORAL_TABLET | Freq: Every day | ORAL | Status: DC
  Administered 2012-10-07: 500 mg via ORAL
  Filled 2012-10-07 (×3): qty 1

## 2012-10-07 MED ORDER — SULFAMETHOXAZOLE-TMP DS 800-160 MG PO TABS
1.0000 | ORAL_TABLET | Freq: Two times a day (BID) | ORAL | Status: DC
  Filled 2012-10-07 (×2): qty 1

## 2012-10-07 MED ORDER — SULFAMETHOXAZOLE-TMP DS 800-160 MG PO TABS: 0.5000 | ORAL_TABLET | Freq: Two times a day (BID) | ORAL | Status: DC

## 2012-10-07 MED ORDER — SULFAMETHOXAZOLE-TRIMETHOPRIM 400-80 MG PO TABS
1.0000 | ORAL_TABLET | Freq: Two times a day (BID) | ORAL | Status: DC
  Administered 2012-10-07 – 2012-10-09 (×5): 1 via ORAL
  Filled 2012-10-07 (×7): qty 1

## 2012-10-07 MED ORDER — INSULIN ASPART 100 UNIT/ML ~~LOC~~ SOLN
0.0000 [IU] | SUBCUTANEOUS | Status: DC
  Administered 2012-10-07 (×3): 3 [IU] via SUBCUTANEOUS
  Administered 2012-10-08: 2 [IU] via SUBCUTANEOUS
  Administered 2012-10-08: 5 [IU] via SUBCUTANEOUS
  Administered 2012-10-08 (×2): 3 [IU] via SUBCUTANEOUS
  Administered 2012-10-08: 2 [IU] via SUBCUTANEOUS
  Administered 2012-10-09: 5 [IU] via SUBCUTANEOUS
  Administered 2012-10-09: 2 [IU] via SUBCUTANEOUS
  Administered 2012-10-09: 3 [IU] via SUBCUTANEOUS

## 2012-10-07 MED ORDER — ATORVASTATIN CALCIUM 40 MG PO TABS
40.0000 mg | ORAL_TABLET | Freq: Every day | ORAL | Status: DC
  Administered 2012-10-07 – 2012-10-10 (×3): 40 mg via ORAL
  Filled 2012-10-07 (×7): qty 1

## 2012-10-07 MED ORDER — SULFAMETHOXAZOLE-TMP DS 800-160 MG PO TABS
1.0000 | ORAL_TABLET | Freq: Two times a day (BID) | ORAL | Status: DC
  Administered 2012-10-07: 1 via ORAL
  Filled 2012-10-07 (×2): qty 1

## 2012-10-07 NOTE — Progress Notes (Signed)
[facility] PROGRESS NOTE  John [name]. ZOX:[id number] DOB: Jul 05, 1940 DOA: 10/04/2012 PCP: Hoyle Sauer, MD  Assessment/Plan: 1. CHF; reviewed films with patient's nurse and wife patient has rare pulmonary edema, we'll aggressively diuresis patient. Continue 160 mg Lasix +5 mg Zaroxolyn. MUST REMEMBER patient is a renal transplant patient and only has one kidney. Daily a.m. weights, strict I.'s and O.'s Obtained 2-D echocardiogram the results below. NOTE; cardiology on board patient was seen by Willa Rough, MD, [facility]) in the ED. troponin x 3 within normal limits. Admission weight= 215lbs, current weight= 199lbs  will obtain p.m.CXR   2. Respiratory failure; see #1   3. Renal transplant; Pt now able to take all required PO meds.nephrology Dr. Delano Metz following  for help with aggressive diuresis the patient with one kidney.  Continue IV Lasix  160 mg every 8 hours+ 5 mg Zaroxolyn daily. Admission weight= 215lbs, current weight= 199lbs  admission creatinine = 1.44 , current creatinine = 1.16 continues to improve , baseline creatinine = approximately is 0.9 continue strict I.'s and O.'s, daily a.m. weights  4. COPD; Respiratory effort has declined w/ diuresis. If needed Pt could tolerate COPD nebulizers.   5. Parkinson's; restart patient's Sinemet today  6. Diabetes type 2 on insulin pump; insulin pump was disconnected and taken home. Increase patient's SSI to moderate today   7. Atrial Fib; currently in NSR, rate controlled, only on aspirin for anticoagulation  8. UTI; after discussion with pharmacist on possible interaction of Cipro with patient's CellCept will use  Bactrim  9. Hypomagnesemia; patient a little bit low will supplement with 500 mg daily (Mg goal>2mg /dLfor a cardiac patient)  Code Status: Full Family Communication: Wife at bedside to discuss plan of care Disposition Plan:    Consultants:  nephrology Dr. Delano Metz  Procedure   CXR 10/04/2012  Diffuse slightly asymmetric air space disease similar to the recent  examination. Question pulmonary edema versus infectious  infiltrate. Recommend follow-up until clearance.  Cardiomegaly post CABG.  Calcified aorta.  Echocardiogram 10/05/2012 - Comparison to prior study March 2014. There is mild LVH with LVEF 55%, mld inferior hypokinesis, indeterminant diastolic function. MIld biatrial enlargement. Overall moderate aortic stenosis, functionally bicuspid valve - similar gradients. RV enlargement and dysfunction with PASP now increased to 64 mmHg, CVP elevated.  Urine culture 10/04/2012; Enterobacter Cloacae; sensitive       Antibiotics:   Bactrim 10/07/2012 day 1/5  HPI/Subjective: John Rieves. is a 72 y.o. 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  With regard to history, he had a long and complicated hospital admission in 1/11 when was admitted with pulm edema/NSTEMI. Stress test was abnormal so he went on to have cath showing severe disease involving the LM, LAD, ramus, CFX and RCA. He underwent CABG x 5. Post-op course was complicated by pulmonary edema requiring intubation, renal failure requiring CVVH, and atrial fibrillation. He was in the hospital about 7 weeks. In 11/11, patient had right SFA and popliteal atherectomy that was complicated by a left groin pseudoaneurysm. In the  fall of 2012, John [name] reported worsening  orthostatic-type lightheadedness and worsening exertional dyspnea. Dr. Shirlee Latch decided at that point to take him for right and left heart cath in 10/12. On Lasix, his right and left heart filling pressures were not significantly elevated. His bypass grafts were patent but he had an ungrafted PDA with 95% stenosis (small vessel that would not be amenable to PCI). As this was a possible source of ischemia and could potentially cause exertional dyspnea (his anginal equivalent was dyspnea in the past), Dr. Shirlee Latch started him on Imdur and took him off lisinopril due to worsening orthostatic symptoms. He has been on pyridostigmine for orthostatic hypotension with supine hypertension. In 3/13, John [name] had back surgery. Post-operative course was complicated by fluid retention and respiratory failure requiring intubation. He developed PNA and delirium. He had a prolonged hospitalization and rehabilitation. He has been having difficulty walking due to poor balance and right foot drop. He has seen neurology and was diagnosed with Parkinson's. He is wearing a brace which has helped with the foot drop. Last echo in 3/14 showed normal EF, moderate LVH, and moderate aortic stenosis.  More recently, the patient underwent R thoracentesis 09/15/12 for loculated pleural effusion. He was also recently completed a course of Augmentin and prednisone taper for dyspnea. Over the past 10 days, however, he has developed significant DOE, dry cough, LEE, and increase in abdominal girth. Decreased UOP per wife - pt hasn't noticed. He was evaluated in the pulmonary office 2 days ago at which time Lasix was increased to 80mg  BID. LE dopplers were negative for DVT. He saw Dr. Abel Presto yesterday and K was discontinued due to level 5.7, prednisone was decreased from 10->5mg , and Lasix was increased to 120mg  BID. He took 2 doses of that yesterday and 1 dose this AM, but was still significantly SOB so his wife drove him to the ER. He was hypoxic  at 87% on [place] thus was placed on bipap. He has been intermittently bradycardic down to the mid 30's but is currently maintaining 40s-60s. He is in [place]. At times when dropping slower he has a 2:1 pattern but per discussion with Dr. Graciela Husbands with EP, this is likely [place] as well given surrounding rhythm. He is still mentating well and BP is stable. Please note in his chart the vitals show HR in 120's but this is an error. He has not been tachycardic this admission. pBNP 6763, troponin neg x 1, BUN/Cr 44/1.39, K 5.1, Hgb 10.7/plt 126. CXR shows diffuse slightly asymmetric air space disease similar to the recent examination, question pulmonary edema versus infectious infiltrate, cardiomegaly post CABG, calcified aorta. (CXR on 8/13 was read as moderate layering/loculated right pleural effusion.) He denies any fever, chills, orthopnea, CP, palpitations, or syncope. Difficult to weigh regularly at home due to balance, but weight is stable today compared to last in 08/2012 and down from May/June. He received 80mg  IV Lasix and atrovent/proventil neb in the ER and is beginning to have good UOP. Patient seen by Willa Rough, MD,([facility]) in the ED. Patient initially was admitted to 4 E. rapid response team was called when patient into respiratory distress and patient was transferred to 2900. Currently patient still feeling short of breath while on BiPAP, hypertensive, respiration rate in the high 20s -30s. Most of this history was obtained from the wife was at his bedside. TODAY states is much more comfortable breathing and slept well. Patient on nasal cannula and resting comfortably   Objective: Filed Vitals:  10/07/12 0000 10/07/12 0313 10/07/12 0402 10/07/12 0729  BP: 158/72  146/63   Pulse: 69  77   Temp:   97.4 F (36.3 C) 98.6 F (37 C)  TempSrc:   Oral Oral  Resp: 32 31 32   Height:      Weight:  90.4 kg (199 lb 4.7 oz)    SpO2: 94% 93% 98%     Intake/Output Summary (Last 24 hours)  at 10/07/12 0815 Last data filed at 10/07/12 0742  Gross per 24 hour  Intake    700 ml  Output   3240 ml  Net  -2540 ml   Filed Weights   10/04/12 2300 10/06/12 0600 10/07/12 0313  Weight: 96.6 kg (212 lb 15.4 oz) 92 kg (202 lb 13.2 oz) 90.4 kg (199 lb 4.7 oz)    Exam:   General:  Alert, work of breathing much improved, sleeping but easily arousable and answers questions appropriately  Cardiovascular: Normal sinus rhythm, positive holosystolic murmur, negative rubs or gallops, DP/PT pulse nonpalpable  Respiratory: Clear to auscultation bilateral apices, mid/lower lung lobes clearing mild rales bibasilar   Abdomen: Soft nontender nondistended plus bowel sounds  Musculoskeletal: Continued bilateral pedal edema 2+ to midcalf    Data Reviewed: Basic Metabolic Panel:  Recent Labs Lab 10/02/12 1702 10/04/12 1146 10/04/12 2036 10/05/12 0500 10/06/12 0434 10/07/12 0442  NA 136 136  --  138 138 138  K 5.9* 5.1  --  5.0 4.2 4.0  CL 107 104  --  106 102 99  CO2 22 21  --  25 25 30   GLUCOSE 181* 92  --  168* 131* 201*  BUN 42* 44*  --  44* 43* 41*  CREATININE 1.5 1.39* 1.44* 1.38* 1.28 1.16  CALCIUM 8.9 9.4  --  9.2 9.1 9.3  MG  --   --   --  1.9 1.7 1.5   Liver Function Tests:  Recent Labs Lab 10/05/12 0500 10/06/12 0434 10/07/12 0442  AST 14 12 10   ALT 5 6 5   ALKPHOS 168* 172* 168*  BILITOT 1.2 1.6* 1.8*  PROT 6.3 6.8 6.8  ALBUMIN 2.5* 2.6* 2.4*   No results found for this basename: LIPASE, AMYLASE,  in the last 168 hours No results found for this basename: AMMONIA,  in the last 168 hours CBC:  Recent Labs Lab 10/02/12 1702 10/04/12 1146 10/04/12 2036  WBC 7.7 8.4 6.6  NEUTROABS 6.9  --   --   HGB 10.2* 10.7* 10.3*  HCT 32.0* 33.1* 33.0*  MCV 79.7 79.6 80.7  PLT 141.0* 126* 132*   Cardiac Enzymes:  Recent Labs Lab 10/05/12 0050 10/05/12 0500 10/05/12 1435  TROPONINI <0.30 <0.30 <0.30   BNP (last 3 results)  Recent Labs  05/20/12 0926  10/02/12 1702 10/04/12 1146  PROBNP 513.0* 662.0* 6763.0*   CBG:  Recent Labs Lab 10/05/12 2200 10/06/12 0748 10/06/12 1248 10/06/12 1648 10/07/12 0732  GLUCAP 176* 147* 196* 280* 180*    Recent Results (from the past 240 hour(s))  URINE CULTURE     Status: None   Collection Time    10/04/12  2:36 PM      Result Value Range Status   Specimen Description URINE, CATHETERIZED   Final   Special Requests NONE   Final   Culture  Setup Time     Final   Value: 10/04/2012 16:21     Performed at [facility] Count     Final  Value: >=100,000 COLONIES/ML     Performed at [facility]   Culture     Final   Value: ENTEROBACTER CLOACAE     Performed at [facility]   Report Status 10/07/2012 FINAL   Final   Organism ID, Bacteria ENTEROBACTER CLOACAE   Final     Studies: Dg Chest Port 1 View  10/06/2012   *RADIOLOGY REPORT*  Clinical Data: Edema.  PORTABLE CHEST - 1 VIEW  Comparison: 10/05/2012  Findings: Patchy airspace densities, right side greater than left. Findings could represent asymmetric pulmonary edema and/or infection.  Heart size is grossly stable.  No evidence for pneumothorax.  IMPRESSION: Minimal change in the bilateral airspace disease, right side greater than left.   Original Report Authenticated By: Richarda Overlie, M.D.    Scheduled Meds: . ALPRAZolam  0.5 mg Oral BID  . aspirin EC  81 mg Oral Daily  . atorvastatin  40 mg Oral q1800  . calcium carbonate  2 tablet Oral Daily  . carbidopa-levodopa  1 tablet Oral Custom  . carbidopa-levodopa  1 tablet Oral QAC breakfast  . clopidogrel  75 mg Oral Q breakfast  . furosemide  120 mg Intravenous BID  . HYDROmorphone  2 mg Oral Q6H  . insulin aspart  0-9 Units Subcutaneous TID WC  . isosorbide mononitrate  60 mg Oral Daily  . lamoTRIgine  200 mg Oral Daily  . LORazepam  1 mg Oral Once  . methylPREDNISolone (SOLU-MEDROL) injection  4 mg Intravenous Daily  . metolazone  5 mg Oral Daily   . mycophenolate  500 mg Oral BID  . pantoprazole  40 mg Oral Q1200  . potassium chloride  40 mEq Oral Once  . senna-docusate  1 tablet Oral QHS  . sodium chloride  3 mL Intravenous Q12H  . sulfamethoxazole-trimethoprim  1 tablet Oral Q12H  . tacrolimus  5 mg Oral BID  . tamsulosin  0.8 mg Oral Daily  . Umeclidinium-Vilanterol  1 puff Inhalation Daily  . Vilazodone HCl  20 mg Oral Daily  . vitamin C  1,000 mg Oral Daily   Continuous Infusions:   Principal Problem:   Chronic diastolic heart failure Active Problems:   HYPERLIPIDEMIA-MIXED   CORONARY ATHEROSLERO UNSPEC TYPE BYPASS GRAFT   Campath-induced atrial fibrillation   COPD (chronic obstructive pulmonary disease) gold stage D.   DM (diabetes mellitus), type 2   Parkinson's disease   Acute respiratory failure   Pulmonary hypertension    Time spent:    Teneil Shiller, Lyda Jester, J  [facility] Pager [phone]. If 7PM-7AM, please contact night-coverage at [email or web address], password [facility] 10/07/2012, 8:15 AM  LOS: 3 days

## 2012-10-07 NOTE — Evaluation (Signed)
Physical Therapy Evaluation Patient Details Name: John [name]. MRN: [id number] DOB: [birth year] Today's Date: 10/07/2012 Time: 4098-1191 PT Time Calculation (min): 39 min  PT Assessment / Plan / Recommendation History of Present Illness  72 y.o. male 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    Clinical Impression  Pt severely debilitated requiring maxA for all mobility and ADLs at this time. Spoke extensively with daughter and spouse regarding expectations and hopes for patients care an disch plans. Spouse feels confused on direction of patients medical care and is unsure of prognosis. Spouse desires CIR and then home however unsure if pt will be able to tolerate CIR at this time due to medical severity. Suspect pt will need SNF upon d/c to address below deficits however will con't to assess t/o hospital stay.    PT Assessment  Patient needs continued PT services    Follow Up Recommendations  SNF;Supervision/Assistance - 24 hour    Does the patient have the potential to tolerate intense rehabilitation      Barriers to Discharge   spouse unable to assist pt at this level of mobility    Equipment Recommendations  None recommended by PT    Recommendations for Other Services     Frequency Min 3X/week    Precautions / Restrictions Precautions Precautions: Fall Restrictions Weight Bearing Restrictions: No   Pertinent Vitals/Pain Noted bilat LE edema       Mobility  Bed Mobility Bed Mobility: Supine to Sit;Sit to Supine Supine to Sit: 1: +2 Total assist;With rails;HOB elevated Supine to Sit: Patient Percentage: 40% Sit to Supine: 1: +2 Total assist;With rail;HOB flat Sit to Supine: Patient Percentage: 20% Details for Bed Mobility Assistance: assist for trunk elevation and to bring hips to EOB. Pt able to initiate moving LEs to EOB Transfers Transfers: Sit to Stand;Stand to Sit Sit to Stand: 1: +2 Total assist;With upper extremity assist;From bed Sit to Stand: Patient Percentage: 40% Stand to Sit: 1: +2 Total assist;Without upper extremity assist;To bed Stand to Sit: Patient Percentage: 20% Details for Transfer Assistance: tactile cues to maintain hip extension. pt with poor endurance and unable to maintain upright position. attempted to march in place but pt unable to lift LEs Ambulation/Gait Ambulation/Gait Assistance: Not tested (comment)    Exercises     PT Diagnosis: Difficulty walking;Generalized weakness  PT Problem List: Decreased strength;Decreased activity tolerance;Decreased mobility PT Treatment Interventions: DME instruction;Gait training;Functional mobility training;Therapeutic activities;Therapeutic exercise     PT Goals(Current goals can be found in the care plan section) Acute Rehab PT Goals Patient Stated Goal: spouse reports she hopes for inpatient rehab and then to home PT Goal Formulation: With patient/family Time For Goal Achievement: 10/14/12 Potential to Achieve Goals: Good  Visit Information  Last PT Received On: 10/07/12 Assistance Needed: +2 History of Present Illness: 72 y.o. male 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         Prior Functioning  Home Living Family/patient expects to be discharged to:: Private residence Living Arrangements: Spouse/significant other Available Help at Discharge: Family;Available 24 hours/day Type of Home: House Home Access: Stairs to enter [employer] of Steps: 4 Entrance Stairs-Rails: Right;Left Home Layout: Two level;Able to live on main level with bedroom/bathroom Alternate Level Stairs-Number of Steps:  (has stair lift) Home Equipment: Walker - 2 wheels;Tub bench Additional Comments: was at out patient neuro PT Prior Function Level of Independence: Independent with assistive device(s);Needs assistance Gait / Transfers Assistance Needed: uses RW ADL's / Homemaking Assistance Needed: wife assist with bathing/dressing Communication Communication: No difficulties Dominant Hand: Right    Cognition  Cognition Arousal/Alertness: Lethargic Behavior During Therapy: Flat affect    Extremity/Trunk Assessment Upper Extremity Assessment Upper Extremity Assessment: Generalized weakness Lower Extremity Assessment Lower Extremity Assessment:  (grossly 3-/5) Cervical / Trunk Assessment Cervical / Trunk Assessment: Normal   Balance Balance Balance Assessed: Yes Static Sitting Balance Static Sitting - Balance Support: Bilateral upper extremity supported Static Sitting - Level of Assistance: 3: Mod assist Static Sitting - Comment/# of Minutes: 8 min, pt with increased trunk flexion and L lateral lean. pt with difficulty maintaining cervical extension  End of Session PT - End of Session Equipment Utilized During Treatment: Gait belt (L ankle brace and R anterior AFO) Activity Tolerance: Patient limited by  fatigue Patient left: in bed;with call bell/phone within reach;with family/visitor present;with nursing/sitter in room Nurse Communication: Mobility status  GP     Marcene Brawn 10/07/2012, 3:11 PM Lewis Shock, PT, DPT Pager #: [phone] Office #: [phone]

## 2012-10-07 NOTE — Progress Notes (Addendum)
Patient ID: John [name]., male   DOB: Jun 19, 1940, 72 y.o.   MRN: [id number]    SUBJECTIVE: Sleepy this morning but appropriate response to questions.  States that breathing is better.  Continues to diurese well, weight is down.  Creatinine remaining stable.  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   . ALPRAZolam  0.5 mg Oral BID  . aspirin EC  81 mg Oral Daily  . calcium carbonate  2 tablet Oral Daily  . carbidopa-levodopa  1 tablet Oral Custom  . carbidopa-levodopa  1 tablet Oral QAC breakfast  . ciprofloxacin  500 mg Oral BID  . clopidogrel  75 mg Oral Q breakfast  . furosemide  120 mg Intravenous BID  . HYDROmorphone  2 mg Oral Q6H  . insulin aspart  0-9 Units Subcutaneous TID WC  . isosorbide mononitrate  60 mg Oral Daily  . lamoTRIgine  200 mg Oral Daily  . LORazepam  1 mg Oral Once  . methylPREDNISolone (SOLU-MEDROL) injection  4 mg Intravenous Daily  . metolazone  5 mg Oral Daily  . mycophenolate  500 mg Oral BID  . pantoprazole  40 mg Oral Q1200  . senna-docusate  1 tablet Oral QHS  . sodium chloride  3 mL Intravenous Q12H  . tacrolimus  5 mg Oral BID  . tamsulosin  0.8 mg Oral Daily  . Umeclidinium-Vilanterol  1 puff Inhalation Daily  . Vilazodone HCl  20 mg Oral Daily  . vitamin C  1,000 mg Oral Daily      Filed Vitals:   10/07/12 0000 10/07/12 0313 10/07/12 0402 10/07/12 0729  BP: 158/72  146/63   Pulse: 69  77   Temp:   97.4 F (36.3 C) 98.6 F (37 C)  TempSrc:   Oral Oral  Resp: 32 31 32   Height:      Weight:  90.4 kg (199 lb 4.7 oz)    SpO2: 94% 93% 98%     Intake/Output Summary (Last 24 hours) at 10/07/12 0751 Last data filed at 10/07/12 0742  Gross per 24 hour  Intake    700 ml  Output   3240 ml  Net  -2540 ml    LABS: Basic Metabolic Panel:  Recent Labs  40/98/11 0434 10/07/12 0442  NA 138 138  K 4.2 4.0  CL 102 99  CO2 25 30  GLUCOSE 131* 201*  BUN 43* 41*  CREATININE 1.28 1.16  CALCIUM 9.1 9.3  MG 1.7 1.5   Liver  Function Tests:  Recent Labs  10/06/12 0434 10/07/12 0442  AST 12 10  ALT 6 5  ALKPHOS 172* 168*  BILITOT 1.6* 1.8*  PROT 6.8 6.8  ALBUMIN 2.6* 2.4*   No results found for this basename: LIPASE, AMYLASE,  in the last 72 hours CBC:  Recent Labs  10/04/12 1146 10/04/12 2036  WBC 8.4 6.6  HGB 10.7* 10.3*  HCT 33.1* 33.0*  MCV 79.6 80.7  PLT 126* 132*   Cardiac Enzymes:  Recent Labs  10/05/12 0050 10/05/12 0500 10/05/12 1435  TROPONINI <0.30 <0.30 <0.30   BNP: No components found with this basename: POCBNP,  D-Dimer: No results found for this basename: DDIMER,  in the last 72 hours Hemoglobin A1C: No results found for this basename: HGBA1C,  in the last 72 hours Fasting Lipid Panel: No results found for this basename: CHOL, HDL, LDLCALC, TRIG, CHOLHDL, LDLDIRECT,  in the last 72 hours Thyroid Function Tests:  Recent Labs  10/04/12 2036  TSH 2.000   Anemia Panel: No results found for this basename: VITAMINB12, FOLATE, FERRITIN, TIBC, IRON, RETICCTPCT,  in the last 72 hours  RADIOLOGY: Dg Chest Port 1 View  10/06/2012   *RADIOLOGY REPORT*  Clinical Data: Edema.  PORTABLE CHEST - 1 VIEW  Comparison: 10/05/2012  Findings: Patchy airspace densities, right side greater than left. Findings could represent asymmetric pulmonary edema and/or infection.  Heart size is grossly stable.  No evidence for pneumothorax.  IMPRESSION: Minimal change in the bilateral airspace disease, right side greater than left.   Original Report Authenticated By: Richarda Overlie, M.D.   Dg Chest Port 1 View  10/05/2012   *RADIOLOGY REPORT*  Clinical Data: Pulmonary edema.  Evaluate for respiratory status and lung status.  PORTABLE CHEST - 1 VIEW  Comparison: 10/04/2012.  Findings: Patchy bilateral airspace lung opacities are similar to the previous day's exam.  There is additional opacity at the right lung base consistent with atelectasis and a small effusion.  This is also stable.  There is no  pneumothorax.  Changes from CABG surgery are stable.  The cardiac silhouette is normal in size.  IMPRESSION: Persistent bilateral airspace lung opacities with additional right lung base pleural fluid and atelectasis.  There is been no change from prior exam. The bilateral lung opacities are consistent with pulmonary edema.   Original Report Authenticated By: Amie Portland, M.D.   Dg Chest Port 1 View  10/04/2012   *RADIOLOGY REPORT*  Clinical Data: Shortness of breath.  PORTABLE CHEST - 1 VIEW  Comparison: 10/02/2012.  Findings: Diffuse slightly asymmetric air space disease similar to the recent examination.  Question pulmonary edema versus infectious infiltrate.  Cardiomegaly post CABG.  Calcified aorta.  IMPRESSION: Diffuse slightly asymmetric air space disease similar to the recent examination.  Question pulmonary edema versus infectious infiltrate. Recommend follow-up until clearance.  Cardiomegaly post CABG.  Calcified aorta.   Original Report Authenticated By: Lacy Duverney, M.D.   US Thoracentesis Asp Pleural Space W/img Guide  09/15/2012   *RADIOLOGY REPORT*  Clinical Data:  Patient with history of prior CABG, COPD, right pleural effusion, dyspnea.  Request is made for therapeutic right thoracentesis.  ULTRASOUND GUIDED  THERAPEUTIC RIGHT THORACENTESIS  An ultrasound guided thoracentesis was thoroughly discussed with the patient and questions answered.  The benefits, risks, alternatives and complications were also discussed.  The patient understands and wishes to proceed with the procedure.  Written consent was obtained.  Ultrasound was performed to localize and mark an adequate pocket of fluid in the right chest.  The area was then prepped and draped in the normal sterile fashion.  1% Lidocaine was used for local anesthesia.  Under ultrasound guidance a 19 gauge Yueh catheter was introduced.  Thoracentesis was performed.  The catheter was removed and a dressing applied.  Complications:  none  Findings:  A total of approximately 150 cc's of blood tinged serous fluid was removed. Due to the multiloculated nature of the collection and despite catheter manipulation, only the above amount of fluid could be aspirated at this time.  IMPRESSION: Successful ultrasound guided therapeutic right thoracentesis yielding 150 cc's of pleural fluid. Due to the multiloculated nature of the collection and small amount of fluid removed follow- up CT chest may be helpful for further characterization.  Read by: Jeananne Rama, P.A.-C   Original Report Authenticated By: Irish Lack, M.D.    PHYSICAL EXAM General: NAD, sleepy Neck: JVP 12 cm, no thyromegaly or thyroid nodule.  Lungs: Decreased breath sounds at  bases bilaterally.  CV: Nondisplaced PMI.  Heart regular S1/S2, no S3/S4, 3/6 SEM RUSB.  1+ ankle edema.  No carotid bruit.   Abdomen: Soft, nontender, no hepatosplenomegaly, no distention.  Neurologic: Alert and oriented x 3.  Psych: Normal affect. Extremities: No clubbing or cyanosis.   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 1. 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  He has diuresed well in the hospital.  He continued to diurese well last night.  Creatinine is stable and weight is down.  He is still volume overloaded, would continue current diuretics today as he is still volume overloaded and current regimen has been successful.  2. COPD: He is on home oxygen at baseline.  3. CKD: Creatinine has been improving compared to admission.  Continue to follow closely.  4. 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 His HR is fine currently.  Agree with stopping pyridostigmine as he  was bradycardic at admission.  He will need to be followed closely for orthostatic hypotension when he starts to get out of bed. 5. UTI: Enterobacter in urine, Cipro and Bactrim sensitive.  Will treat for complicated UTI given foley, will use Bactrim given potential interaction of Cipro with mycophenolate.  6. CAD: s/p CABG.  Stable, on ASA/Plavix.  Can restart his atorvastatin.  7. Disposition: Needs PT/OT evaluation.  Noted palliative care involvement.   [name] 10/07/2012 7:57 AM

## 2012-10-07 NOTE — Care Management Note (Signed)
    Page 1 of 1   10/07/2012     11:46:02 AM   CARE MANAGEMENT NOTE 10/07/2012  Patient:  John [name], John [name]   Account Number:  [id number]  Date Initiated:  10/07/2012  Documentation initiated by:  Junius Creamer  Subjective/Objective Assessment:   adm w heart failure     Action/Plan:   lives w wife, pcp dr [name]   Anticipated DC Date:     Anticipated DC Plan:        DC Planning Services  CM consult      Choice offered to / List presented to:             Status of service:   Medicare Important Message given?   (If response is "NO", the following Medicare IM given date fields will be blank) Date Medicare IM given:   Date Additional Medicare IM given:    Discharge Disposition:    Per UR Regulation:  Reviewed for med. necessity/level of care/duration of stay  If discussed at Long Length of Stay Meetings, dates discussed:    Comments:  8/18 1145a debbie Ottilia Pippenger rn,bsn will moniter for dc planning as pt progresses.

## 2012-10-07 NOTE — Progress Notes (Signed)
Patient ID: John [name]., male   DOB: Jul 31, 1940, 72 y.o.   MRN: [id number] S:lethargic but arousable, no c/o's O:BP 146/63  Pulse 77  Temp(Src) 99.4 F (37.4 C) (Oral)  Resp 32  Ht 6\' 2"  (1.88 m)  Wt 90.4 kg (199 lb 4.7 oz)  BMI 25.58 kg/m2  SpO2 98%  Intake/Output Summary (Last 24 hours) at 10/07/12 1337 Last data filed at 10/07/12 0936  Gross per 24 hour  Intake    380 ml  Output   2840 ml  Net  -2460 ml   Intake/Output: I/O last 3 completed shifts: In: 832 [P.O.:700; IV Piggyback:132] Out: 5840 [Urine:5840]  Intake/Output this shift:  Total I/O In: -  Out: 500 [Urine:500] Weight change: -1.6 kg (-3 lb 8.4 oz) Gen:WD chronically ill-appearing WM who is lethargic but arousable CVS:III/VI SEM, no rub Resp:occ rhonchi bilaterally NWG:NFAOZH Ext:tr-1+ pretib edema   Recent Labs Lab 10/02/12 1702 10/04/12 1146 10/04/12 2036 10/05/12 0500 10/06/12 0434 10/07/12 0442  NA 136 136  --  138 138 138  K 5.9* 5.1  --  5.0 4.2 4.0  CL 107 104  --  106 102 99  CO2 22 21  --  25 25 30   GLUCOSE 181* 92  --  168* 131* 201*  BUN 42* 44*  --  44* 43* 41*  CREATININE 1.5 1.39* 1.44* 1.38* 1.28 1.16  ALBUMIN  --   --   --  2.5* 2.6* 2.4*  CALCIUM 8.9 9.4  --  9.2 9.1 9.3  AST  --   --   --  14 12 10   ALT  --   --   --  5 6 5    Liver Function Tests:  Recent Labs Lab 10/05/12 0500 10/06/12 0434 10/07/12 0442  AST 14 12 10   ALT 5 6 5   ALKPHOS 168* 172* 168*  BILITOT 1.2 1.6* 1.8*  PROT 6.3 6.8 6.8  ALBUMIN 2.5* 2.6* 2.4*   No results found for this basename: LIPASE, AMYLASE,  in the last 168 hours No results found for this basename: AMMONIA,  in the last 168 hours CBC:  Recent Labs Lab 10/02/12 1702 10/04/12 1146 10/04/12 2036  WBC 7.7 8.4 6.6  NEUTROABS 6.9  --   --   HGB 10.2* 10.7* 10.3*  HCT 32.0* 33.1* 33.0*  MCV 79.7 79.6 80.7  PLT 141.0* 126* 132*   Cardiac Enzymes:  Recent Labs Lab 10/05/12 0050 10/05/12 0500 10/05/12 1435   TROPONINI <0.30 <0.30 <0.30   CBG:  Recent Labs Lab 10/06/12 0748 10/06/12 1248 10/06/12 1648 10/07/12 0732 10/07/12 1203  GLUCAP 147* 196* 280* 180* 178*    Iron Studies: No results found for this basename: IRON, TIBC, TRANSFERRIN, FERRITIN,  in the last 72 hours Studies/Results: Dg Chest Port 1 View  10/07/2012   *RADIOLOGY REPORT*  Clinical Data: Follow up pulmonary edema  PORTABLE CHEST - 1 VIEW  Comparison: 10/06/2012  Findings: Previous median sternotomy CABG procedure.  There is moderate cardiac enlargement noted. Bilateral pleural effusions and pulmonary edema is unchanged compared with previous exam.  IMPRESSION:  1.  No change in CHF.   Original Report Authenticated By: Signa Kell, M.D.   Dg Chest Port 1 View  10/06/2012   *RADIOLOGY REPORT*  Clinical Data: Edema.  PORTABLE CHEST - 1 VIEW  Comparison: 10/05/2012  Findings: Patchy airspace densities, right side greater than left. Findings could represent asymmetric pulmonary edema and/or infection.  Heart size is grossly stable.  No evidence for  pneumothorax.  IMPRESSION: Minimal change in the bilateral airspace disease, right side greater than left.   Original Report Authenticated By: Richarda Overlie, M.D.   . ALPRAZolam  0.5 mg Oral BID  . aspirin EC  81 mg Oral Daily  . atorvastatin  40 mg Oral q1800  . calcium carbonate  2 tablet Oral Daily  . carbidopa-levodopa  1 tablet Oral Custom  . carbidopa-levodopa  1 tablet Oral QAC breakfast  . clopidogrel  75 mg Oral Q breakfast  . furosemide  120 mg Intravenous BID  . HYDROmorphone  2 mg Oral Q6H  . insulin aspart  0-15 Units Subcutaneous Q4H  . isosorbide mononitrate  60 mg Oral Daily  . lamoTRIgine  200 mg Oral Daily  . LORazepam  1 mg Oral Once  . magnesium gluconate  500 mg Oral Daily  . methylPREDNISolone (SOLU-MEDROL) injection  4 mg Intravenous Daily  . metolazone  5 mg Oral Daily  . mycophenolate  500 mg Oral BID  . pantoprazole  40 mg Oral Q1200  .  senna-docusate  1 tablet Oral QHS  . sodium chloride  3 mL Intravenous Q12H  . sulfamethoxazole-trimethoprim  1 tablet Oral Q12H  . tacrolimus  5 mg Oral BID  . tamsulosin  0.8 mg Oral Daily  . Umeclidinium-Vilanterol  1 puff Inhalation Daily  . Vilazodone HCl  20 mg Oral Daily  . vitamin C  1,000 mg Oral Daily    BMET    Component Value Date/Time   NA 138 10/07/2012 0442   K 4.0 10/07/2012 0442   CL 99 10/07/2012 0442   CO2 30 10/07/2012 0442   GLUCOSE 201* 10/07/2012 0442   BUN 41* 10/07/2012 0442   CREATININE 1.16 10/07/2012 0442   CALCIUM 9.3 10/07/2012 0442   GFRNONAA 61* 10/07/2012 0442   GFRAA 71* 10/07/2012 0442   CBC    Component Value Date/Time   WBC 6.6 10/04/2012 2036   RBC 4.09* 10/04/2012 2036   RBC 3.70* 03/24/2009 0357   HGB 10.3* 10/04/2012 2036   HCT 33.0* 10/04/2012 2036   PLT 132* 10/04/2012 2036   MCV 80.7 10/04/2012 2036   MCH 25.2* 10/04/2012 2036   MCHC 31.2 10/04/2012 2036   RDW 17.9* 10/04/2012 2036   LYMPHSABS 0.5* 10/02/2012 1702   MONOABS 0.3 10/02/2012 1702   EOSABS 0.1 10/02/2012 1702   BASOSABS 0.0 10/02/2012 1702     Assessment/Plan:  Impression:  1. Acute on chronic resp failure- diuresing 2. COPD / home O2 3. Renal transplant 2002 baseline creat 1.0-1.6 4. Combined syst/diast heart failure, EF 40-45% 5. Recurrent loculated R pleural effusion 6. Parkinson's disease 7. Chronic debility 8. CAD w CABG 2011 9. 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 Agree with current diuretic regimen and cont to follow UOP/Scr 2. Agree with DNR and weighing QOL issues 3. Watch K and Scr while on bactrim which interferes with Scr excretion/secretion and will need to dose renal with SS bid not DS bid.  Orville Mena A

## 2012-10-07 NOTE — Telephone Encounter (Signed)
noted 

## 2012-10-07 NOTE — Telephone Encounter (Signed)
I will forward this message to PW so that he will be aware.

## 2012-10-08 ENCOUNTER — Inpatient Hospital Stay (HOSPITAL_COMMUNITY): Payer: Medicare Other

## 2012-10-08 LAB — COMPREHENSIVE METABOLIC PANEL
ALT: <5 U/L (ref 0–53)
AST: 11 U/L (ref 0–37)
Albumin: 2.3 g/dL — ABNORMAL LOW (ref 3.5–5.2)
Alkaline Phosphatase: 156 U/L — ABNORMAL HIGH (ref 39–117)
BUN: 43 mg/dL — ABNORMAL HIGH (ref 6–23)
CO2: 34 mEq/L — ABNORMAL HIGH (ref 19–32)
Calcium: 9.2 mg/dL (ref 8.4–10.5)
Chloride: 100 mEq/L (ref 96–112)
Creatinine, Ser: 1.25 mg/dL (ref 0.50–1.35)
GFR calc Af Amer: 65 mL/min — ABNORMAL LOW (ref >90)
GFR calc non Af Amer: 56 mL/min — ABNORMAL LOW (ref >90)
Glucose, Bld: 102 mg/dL — ABNORMAL HIGH (ref 70–99)
Potassium: 3.8 mEq/L (ref 3.5–5.1)
Sodium: 141 mEq/L (ref 135–145)
Total Bilirubin: 1.4 mg/dL — ABNORMAL HIGH (ref 0.3–1.2)
Total Protein: 6.1 g/dL (ref 6.0–8.3)

## 2012-10-08 LAB — CBC WITH DIFFERENTIAL/PLATELET
Basophils Absolute: 0 10*3/uL (ref 0.0–0.1)
Basophils Relative: 1 % (ref 0–1)
Eosinophils Relative: 3 % (ref 0–5)
HCT: 32 % — ABNORMAL LOW (ref 39.0–52.0)
MCHC: 31.3 g/dL (ref 30.0–36.0)
MCV: 81.2 fL (ref 78.0–100.0)
Monocytes Absolute: 0.6 10*3/uL (ref 0.1–1.0)
Neutro Abs: 4.6 10*3/uL (ref 1.7–7.7)
Platelets: 111 10*3/uL — ABNORMAL LOW (ref 150–400)
RDW: 17.3 % — ABNORMAL HIGH (ref 11.5–15.5)

## 2012-10-08 LAB — GLUCOSE, CAPILLARY
Glucose-Capillary: 138 mg/dL — ABNORMAL HIGH (ref 70–99)
Glucose-Capillary: 98 mg/dL (ref 70–99)
Glucose-Capillary: 208 mg/dL — ABNORMAL HIGH (ref 70–99)
Glucose-Capillary: 170 mg/dL — ABNORMAL HIGH (ref 70–99)

## 2012-10-08 LAB — MAGNESIUM: Magnesium: 1.4 mg/dL — ABNORMAL LOW (ref 1.5–2.5)

## 2012-10-08 MED ORDER — INSULIN GLARGINE 100 UNIT/ML ~~LOC~~ SOLN
5.0000 [IU] | Freq: Every day | SUBCUTANEOUS | Status: DC
  Administered 2012-10-08: 5 [IU] via SUBCUTANEOUS
  Filled 2012-10-08: qty 0.05

## 2012-10-08 MED ORDER — INSULIN GLARGINE 100 UNIT/ML ~~LOC~~ SOLN
10.0000 [IU] | Freq: Every day | SUBCUTANEOUS | Status: DC
  Administered 2012-10-09 – 2012-10-10 (×2): 10 [IU] via SUBCUTANEOUS
  Filled 2012-10-08 (×3): qty 0.1

## 2012-10-08 MED ORDER — FUROSEMIDE 80 MG PO TABS
120.0000 mg | ORAL_TABLET | Freq: Two times a day (BID) | ORAL | Status: DC
  Administered 2012-10-08: 120 mg via ORAL
  Filled 2012-10-08 (×4): qty 1

## 2012-10-08 MED ORDER — ARFORMOTEROL TARTRATE 15 MCG/2ML IN NEBU
15.0000 ug | INHALATION_SOLUTION | Freq: Two times a day (BID) | RESPIRATORY_TRACT | Status: DC
  Administered 2012-10-08 – 2012-10-14 (×12): 15 ug via RESPIRATORY_TRACT
  Filled 2012-10-08 (×19): qty 2

## 2012-10-08 MED ORDER — NYSTATIN-TRIAMCINOLONE 100000-0.1 UNIT/GM-% EX OINT
TOPICAL_OINTMENT | Freq: Four times a day (QID) | CUTANEOUS | Status: DC
  Administered 2012-10-08 – 2012-10-10 (×11): via TOPICAL
  Administered 2012-10-11: 1 via TOPICAL
  Administered 2012-10-11 – 2012-10-14 (×9): via TOPICAL
  Filled 2012-10-08: qty 15

## 2012-10-08 MED ORDER — ALPRAZOLAM 0.25 MG PO TABS
0.2500 mg | ORAL_TABLET | Freq: Two times a day (BID) | ORAL | Status: DC
  Administered 2012-10-08 – 2012-10-10 (×4): 0.25 mg via ORAL
  Filled 2012-10-08 (×3): qty 1

## 2012-10-08 MED ORDER — HYDROMORPHONE HCL PF 1 MG/ML IJ SOLN
0.2500 mg | INTRAMUSCULAR | Status: DC | PRN
  Administered 2012-10-09: 0.25 mg via INTRAVENOUS
  Filled 2012-10-08: qty 1

## 2012-10-08 MED ORDER — MAGNESIUM GLUCONATE 500 MG PO TABS
500.0000 mg | ORAL_TABLET | Freq: Two times a day (BID) | ORAL | Status: DC
  Administered 2012-10-08 – 2012-10-11 (×7): 500 mg via ORAL
  Filled 2012-10-08 (×11): qty 1

## 2012-10-08 MED ORDER — IPRATROPIUM BROMIDE 0.02 % IN SOLN
0.5000 mg | Freq: Three times a day (TID) | RESPIRATORY_TRACT | Status: DC
  Administered 2012-10-08 (×2): 0.5 mg via RESPIRATORY_TRACT
  Filled 2012-10-08 (×2): qty 2.5

## 2012-10-08 NOTE — Progress Notes (Signed)
[facility] PROGRESS NOTE  John [name]. BJY:[id number] DOB: Jun 23, 1940 DOA: 10/04/2012 PCP: Hoyle Sauer, MD  Assessment/Plan: 1. CHF; reviewed films with patient's nurse and wife patient has rare pulmonary edema, we'll aggressively diuresis patient. Patient's creatinine beginning to climb we'll decrease Lasix to 120 mg Lasix twice a day, DC  Zaroxolyn. MUST REMEMBER patient is a renal transplant patient and only has one kidney. Daily a.m. weights, strict I.'s and O.'s Obtained 2-D echocardiogram the results below. NOTE; cardiology on board patient was seen by Willa Rough, MD, [facility]) in the ED. troponin x 3 within normal limits. Admission weight= 215lbs, current weight= 196lbs believe this is going to be close to his base weight    2. Respiratory failure; significantly improved with the diuresis of 19 lbs see #1   3. Renal transplant; Pt now able to take all required PO meds.nephrology Dr. Delano Metz following  for help with aggressive diuresis the patient with one kidney.  Continue IV Lasix  160 mg every 8 hours+ 5 mg Zaroxolyn daily. Admission weight= 215lbs, current weight= 196lbs  admission creatinine = 1.44 , current creatinine = 1.25 bumped up slightly today fm 1.16 , baseline creatinine = approximately is 0.9 continue strict I.'s and O.'s, daily a.m. weights. D/C Zaroxolyn, decrease Lasix to 120 mg twice a day  4. COPD; Respiratory effort has declined w/ diuresis. Patient has restarted COPD nebulizers and is tolerating well .    5. Parkinson's; restart patient's Sinemet today  6. Diabetes type 2 on insulin pump; insulin pump was disconnected and taken home. Cont.patient's SSI to moderate, increase Lantus to 10 unit   7. Atrial Fib; currently in NSR, rate controlled, only on aspirin for anticoagulation  8. UTI; after discussion with pharmacist on possible interaction of Cipro with patient's CellCept will use  Bactrim  9. Hypomagnesemia; patient a  little bit low will Inc Mg supplement 500 mg BID (Mg goal>2mg /dLfor a cardiac patient)  Code Status: Full Family Communication: Wife at bedside to discuss plan of care Disposition Plan:    Consultants:  nephrology Dr. Delano Metz  Procedure  CXR 10/04/2012  Diffuse slightly asymmetric air space disease similar to the recent  examination. Question pulmonary edema versus infectious  infiltrate. Recommend follow-up until clearance.  Cardiomegaly post CABG.  Calcified aorta.  Echocardiogram 10/05/2012 - Comparison to prior study March 2014. There is mild LVH with LVEF 55%, mld inferior hypokinesis, indeterminant diastolic function. MIld biatrial enlargement. Overall moderate aortic stenosis, functionally bicuspid valve - similar gradients. RV enlargement and dysfunction with PASP now increased to 64 mmHg, CVP elevated.  Urine culture 10/04/2012; Enterobacter Cloacae; sensitive       Antibiotics:   Bactrim 10/07/2012 day 2/5  HPI/Subjective: John Guterrez. is a 72 y.o. 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  With regard to history, he had a long and complicated hospital admission in 1/11 when was admitted with pulm edema/NSTEMI. Stress test was abnormal so he went on to have cath showing severe disease involving the LM, LAD, ramus, CFX and RCA.  He underwent CABG x 5. Post-op course was complicated by pulmonary edema requiring intubation, renal  failure requiring CVVH, and atrial fibrillation. He was in the hospital about 7 weeks. In 11/11, patient had right SFA and popliteal atherectomy that was complicated by a left groin pseudoaneurysm. In the fall of 2012, John [name] reported worsening orthostatic-type lightheadedness and worsening exertional dyspnea. Dr. Shirlee Latch decided at that point to take him for right and left heart cath in 10/12. On Lasix, his right and left heart filling pressures were not significantly elevated. His bypass grafts were patent but he had an ungrafted PDA with 95% stenosis (small vessel that would not be amenable to PCI). As this was a possible source of ischemia and could potentially cause exertional dyspnea (his anginal equivalent was dyspnea in the past), Dr. Shirlee Latch started him on Imdur and took him off lisinopril due to worsening orthostatic symptoms. He has been on pyridostigmine for orthostatic hypotension with supine hypertension. In 3/13, John Fellner had back surgery. Post-operative course was complicated by fluid retention and respiratory failure requiring intubation. He developed PNA and delirium. He had a prolonged hospitalization and rehabilitation. He has been having difficulty walking due to poor balance and right foot drop. He has seen neurology and was diagnosed with Parkinson's. He is wearing a brace which has helped with the foot drop. Last echo in 3/14 showed normal EF, moderate LVH, and moderate aortic stenosis.  More recently, the patient underwent R thoracentesis 09/15/12 for loculated pleural effusion. He was also recently completed a course of Augmentin and prednisone taper for dyspnea. Over the past 10 days, however, he has developed significant DOE, dry cough, LEE, and increase in abdominal girth. Decreased UOP per wife - pt hasn't noticed. He was evaluated in the pulmonary office 2 days ago at which time Lasix was increased to 80mg  BID. LE dopplers were negative for DVT. He saw Dr. Abel Presto yesterday and  K was discontinued due to level 5.7, prednisone was decreased from 10->5mg , and Lasix was increased to 120mg  BID. He took 2 doses of that yesterday and 1 dose this AM, but was still significantly SOB so his wife drove him to the ER. He was hypoxic at 87% on Lucky thus was placed on bipap. He has been intermittently bradycardic down to the mid 30's but is currently maintaining 40s-60s. He is in [place]enckebach. At times when dropping slower he has a 2:1 pattern but per discussion with Dr. Graciela Husbands with EP, this is likely Wenckebach as well given surrounding rhythm. He is still mentating well and BP is stable. Please note in his chart the vitals show HR in 120's but this is an error. He has not been tachycardic this admission. pBNP 6763, troponin neg x 1, BUN/Cr 44/1.39, K 5.1, Hgb 10.7/plt 126. CXR shows diffuse slightly asymmetric air space disease similar to the recent examination, question pulmonary edema versus infectious infiltrate, cardiomegaly post CABG, calcified aorta. (CXR on 8/13 was read as moderate layering/loculated right pleural effusion.) He denies any fever, chills, orthopnea, CP, palpitations, or syncope. Difficult to weigh regularly at home due to balance, but weight is stable today compared to last in 08/2012 and down from May/June. He received 80mg  IV Lasix and atrovent/proventil neb in the ER and is beginning to have good UOP. Patient seen by Willa Rough, MD,([facility]) in the ED. Patient initially was admitted to 4 E. rapid response team was called when patient into respiratory distress and patient was transferred to 2900. Currently patient still feeling short  of breath while on BiPAP, hypertensive, respiration rate in the high 20s -30s. Most of this history was obtained from the wife was at his bedside. TODAY states is much more comfortable breathing and slept well. Patient able to carry on our session with full sentences. Continues to use nasal cannula however patient is O2 dependent at  home   Objective: Filed Vitals:   10/07/12 1600 10/07/12 2054 10/07/12 2300 10/08/12 0343  BP: 118/51 120/41 122/50 129/45  Pulse:   54 108  Temp: 98.3 F (36.8 C) 98 F (36.7 C) 98.7 F (37.1 C) 98.7 F (37.1 C)  TempSrc: Oral Oral Oral Oral  Resp: 27 27 26 24   Height:      Weight:    89.3 kg (196 lb 13.9 oz)  SpO2: 97% 97% 95% 97%    Intake/Output Summary (Last 24 hours) at 10/08/12 0748 Last data filed at 10/08/12 0600  Gross per 24 hour  Intake    542 ml  Output   2550 ml  Net  -2008 ml   Filed Weights   10/06/12 0600 10/07/12 0313 10/08/12 0343  Weight: 92 kg (202 lb 13.2 oz) 90.4 kg (199 lb 4.7 oz) 89.3 kg (196 lb 13.9 oz)    Exam:   General:  Alert, work of breathing much improved, sleeping but easily arousable and answers questions appropriately  Cardiovascular: Normal sinus rhythm, positive holosystolic murmur, negative rubs or gallops, DP/PT pulse nonpalpable  Respiratory: Clear to auscultation bilateral apices, mid/lower lung lobes clearing mild rales bibasilar   Abdomen: Soft nontender nondistended plus bowel sounds  Musculoskeletal: Continued bilateral pedal edema 2+ on feet only (SIGNIFICANT improvement)  Data Reviewed: Basic Metabolic Panel:  Recent Labs Lab 10/04/12 1146 10/04/12 2036 10/05/12 0500 10/06/12 0434 10/07/12 0442 10/08/12 0640  NA 136  --  138 138 138 141  K 5.1  --  5.0 4.2 4.0 3.8  CL 104  --  106 102 99 100  CO2 21  --  25 25 30  34*  GLUCOSE 92  --  168* 131* 201* 102*  BUN 44*  --  44* 43* 41* 43*  CREATININE 1.39* 1.44* 1.38* 1.28 1.16 1.25  CALCIUM 9.4  --  9.2 9.1 9.3 9.2  MG  --   --  1.9 1.7 1.5 1.4*   Liver Function Tests:  Recent Labs Lab 10/05/12 0500 10/06/12 0434 10/07/12 0442 10/08/12 0640  AST 14 12 10 11   ALT 5 6 5  PENDING  ALKPHOS 168* 172* 168* 156*  BILITOT 1.2 1.6* 1.8* 1.4*  PROT 6.3 6.8 6.8 6.1  ALBUMIN 2.5* 2.6* 2.4* 2.3*   No results found for this basename: LIPASE, AMYLASE,  in the  last 168 hours No results found for this basename: AMMONIA,  in the last 168 hours CBC:  Recent Labs Lab 10/02/12 1702 10/04/12 1146 10/04/12 2036  WBC 7.7 8.4 6.6  NEUTROABS 6.9  --   --   HGB 10.2* 10.7* 10.3*  HCT 32.0* 33.1* 33.0*  MCV 79.7 79.6 80.7  PLT 141.0* 126* 132*   Cardiac Enzymes:  Recent Labs Lab 10/05/12 0050 10/05/12 0500 10/05/12 1435  TROPONINI <0.30 <0.30 <0.30   BNP (last 3 results)  Recent Labs  05/20/12 0926 10/02/12 1702 10/04/12 1146  PROBNP 513.0* 662.0* 6763.0*   CBG:  Recent Labs Lab 10/07/12 1203 10/07/12 1541 10/07/12 2129 10/08/12 0033 10/08/12 0351  GLUCAP 178* 191* 189* 138* 98    Recent Results (from the past 240 hour(s))  URINE CULTURE  Status: None   Collection Time    10/04/12  2:36 PM      Result Value Range Status   Specimen Description URINE, CATHETERIZED   Final   Special Requests NONE   Final   Culture  Setup Time     Final   Value: 10/04/2012 16:21     Performed at [facility] Count     Final   Value: >=100,000 COLONIES/ML     Performed at [facility]   Culture     Final   Value: ENTEROBACTER CLOACAE     Performed at [facility]   Report Status 10/07/2012 FINAL   Final   Organism ID, Bacteria ENTEROBACTER CLOACAE   Final     Studies: Dg Chest Port 1 View  10/07/2012   *RADIOLOGY REPORT*  Clinical Data: Follow up pulmonary edema  PORTABLE CHEST - 1 VIEW  Comparison: 10/06/2012  Findings: Previous median sternotomy CABG procedure.  There is moderate cardiac enlargement noted. Bilateral pleural effusions and pulmonary edema is unchanged compared with previous exam.  IMPRESSION:  1.  No change in CHF.   Original Report Authenticated By: Signa Kell, M.D.   Dg Chest Port 1 View  10/06/2012   *RADIOLOGY REPORT*  Clinical Data: Edema.  PORTABLE CHEST - 1 VIEW  Comparison: 10/05/2012  Findings: Patchy airspace densities, right side greater than left. Findings could  represent asymmetric pulmonary edema and/or infection.  Heart size is grossly stable.  No evidence for pneumothorax.  IMPRESSION: Minimal change in the bilateral airspace disease, right side greater than left.   Original Report Authenticated By: Richarda Overlie, M.D.    Scheduled Meds: . ALPRAZolam  0.5 mg Oral BID  . aspirin EC  81 mg Oral Daily  . atorvastatin  40 mg Oral q1800  . calcium carbonate  2 tablet Oral Daily  . carbidopa-levodopa  1 tablet Oral Custom  . carbidopa-levodopa  1 tablet Oral QAC breakfast  . clopidogrel  75 mg Oral Q breakfast  . furosemide  120 mg Intravenous BID  . HYDROmorphone  2 mg Oral Q12H  . insulin aspart  0-15 Units Subcutaneous Q4H  . isosorbide mononitrate  60 mg Oral Daily  . lamoTRIgine  200 mg Oral Daily  . LORazepam  1 mg Oral Once  . magnesium gluconate  500 mg Oral Daily  . methylPREDNISolone (SOLU-MEDROL) injection  4 mg Intravenous Daily  . metolazone  5 mg Oral Daily  . mycophenolate  500 mg Oral BID  . pantoprazole  40 mg Oral Q1200  . senna-docusate  1 tablet Oral QHS  . sodium chloride  3 mL Intravenous Q12H  . sulfamethoxazole-trimethoprim  1 tablet Oral Q12H  . tacrolimus  5 mg Oral BID  . tamsulosin  0.8 mg Oral Daily  . Umeclidinium-Vilanterol  1 puff Inhalation Daily  . Vilazodone HCl  20 mg Oral Daily  . vitamin C  1,000 mg Oral Daily   Continuous Infusions:   Principal Problem:   Chronic diastolic heart failure Active Problems:   HYPERLIPIDEMIA-MIXED   CORONARY ATHEROSLERO UNSPEC TYPE BYPASS GRAFT   Campath-induced atrial fibrillation   COPD (chronic obstructive pulmonary disease) gold stage D.   DM (diabetes mellitus), type 2   Parkinson's disease   Acute respiratory failure   Pulmonary hypertension    Time spent:    WOODS, Lyda Jester, J  [facility] Pager [phone]. If 7PM-7AM, please contact night-coverage at [email or web address], password [facility] 10/08/2012, 7:48 AM  LOS:  4 days

## 2012-10-08 NOTE — Progress Notes (Signed)
OT Cancellation Note  Patient Details Name: John [name]. MRN: [id number] DOB: 1940-08-14   Cancelled Treatment:    Reason Eval/Treat Not Completed: Medical issues which prohibited therapy Pt being taken for STAT head CT.   [facility] Chynna Buerkle, OTR/L  [phone] 10/08/2012 10/08/2012, 3:56 PM

## 2012-10-08 NOTE — Progress Notes (Signed)
Patient ID: John Novel., male   DOB: 1940-04-19, 72 y.o.   MRN: [id number]   SUBJECTIVE: Awake/alert today, does not remember PT coming by yesterday.  Was very weak with PT.  States that breathing is better.  Continues to diurese well, weight is down.  No labs yet this morning.  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  He was not lightheaded when he stood yesterday per his report.   . ALPRAZolam  0.5 mg Oral BID  . aspirin EC  81 mg Oral Daily  . atorvastatin  40 mg Oral q1800  . calcium carbonate  2 tablet Oral Daily  . carbidopa-levodopa  1 tablet Oral Custom  . carbidopa-levodopa  1 tablet Oral QAC breakfast  . clopidogrel  75 mg Oral Q breakfast  . furosemide  120 mg Intravenous BID  . HYDROmorphone  2 mg Oral Q12H  . insulin aspart  0-15 Units Subcutaneous Q4H  . isosorbide mononitrate  60 mg Oral Daily  . lamoTRIgine  200 mg Oral Daily  . LORazepam  1 mg Oral Once  . magnesium gluconate  500 mg Oral Daily  . methylPREDNISolone (SOLU-MEDROL) injection  4 mg Intravenous Daily  . metolazone  5 mg Oral Daily  . mycophenolate  500 mg Oral BID  . pantoprazole  40 mg Oral Q1200  . senna-docusate  1 tablet Oral QHS  . sodium chloride  3 mL Intravenous Q12H  . sulfamethoxazole-trimethoprim  1 tablet Oral Q12H  . tacrolimus  5 mg Oral BID  . tamsulosin  0.8 mg Oral Daily  . Umeclidinium-Vilanterol  1 puff Inhalation Daily  . Vilazodone HCl  20 mg Oral Daily  . vitamin C  1,000 mg Oral Daily      Filed Vitals:   10/07/12 1600 10/07/12 2054 10/07/12 2300 10/08/12 0343  BP: 118/51 120/41 122/50 129/45  Pulse:   54 108  Temp: 98.3 F (36.8 C) 98 F (36.7 C) 98.7 F (37.1 C) 98.7 F (37.1 C)  TempSrc: Oral Oral Oral Oral  Resp: 27 27 26 24   Height:      Weight:    89.3 kg (196 lb 13.9 oz)  SpO2: 97% 97% 95% 97%    Intake/Output Summary (Last 24 hours) at 10/08/12 0734 Last data filed at 10/08/12 0600  Gross per 24 hour  Intake    542 ml  Output   2700 ml  Net   -2158 ml    LABS: Basic Metabolic Panel:  Recent Labs  40/98/11 0434 10/07/12 0442  NA 138 138  K 4.2 4.0  CL 102 99  CO2 25 30  GLUCOSE 131* 201*  BUN 43* 41*  CREATININE 1.28 1.16  CALCIUM 9.1 9.3  MG 1.7 1.5   Liver Function Tests:  Recent Labs  10/06/12 0434 10/07/12 0442  AST 12 10  ALT 6 5  ALKPHOS 172* 168*  BILITOT 1.6* 1.8*  PROT 6.8 6.8  ALBUMIN 2.6* 2.4*   No results found for this basename: LIPASE, AMYLASE,  in the last 72 hours CBC: No results found for this basename: WBC, NEUTROABS, HGB, HCT, MCV, PLT,  in the last 72 hours Cardiac Enzymes:  Recent Labs  10/05/12 1435  TROPONINI <0.30   BNP: No components found with this basename: POCBNP,  D-Dimer: No results found for this basename: DDIMER,  in the last 72 hours Hemoglobin A1C: No results found for this basename: HGBA1C,  in the last 72 hours Fasting Lipid Panel: No results found  for this basename: CHOL, HDL, LDLCALC, TRIG, CHOLHDL, LDLDIRECT,  in the last 72 hours Thyroid Function Tests: No results found for this basename: TSH, T4TOTAL, FREET3, T3FREE, THYROIDAB,  in the last 72 hours Anemia Panel: No results found for this basename: VITAMINB12, FOLATE, FERRITIN, TIBC, IRON, RETICCTPCT,  in the last 72 hours  RADIOLOGY: Dg Chest Port 1 View  10/06/2012   *RADIOLOGY REPORT*  Clinical Data: Edema.  PORTABLE CHEST - 1 VIEW  Comparison: 10/05/2012  Findings: Patchy airspace densities, right side greater than left. Findings could represent asymmetric pulmonary edema and/or infection.  Heart size is grossly stable.  No evidence for pneumothorax.  IMPRESSION: Minimal change in the bilateral airspace disease, right side greater than left.   Original Report Authenticated By: Richarda Overlie, M.D.   Dg Chest Port 1 View  10/05/2012   *RADIOLOGY REPORT*  Clinical Data: Pulmonary edema.  Evaluate for respiratory status and lung status.  PORTABLE CHEST - 1 VIEW  Comparison: 10/04/2012.  Findings: Patchy  bilateral airspace lung opacities are similar to the previous day's exam.  There is additional opacity at the right lung base consistent with atelectasis and a small effusion.  This is also stable.  There is no pneumothorax.  Changes from CABG surgery are stable.  The cardiac silhouette is normal in size.  IMPRESSION: Persistent bilateral airspace lung opacities with additional right lung base pleural fluid and atelectasis.  There is been no change from prior exam. The bilateral lung opacities are consistent with pulmonary edema.   Original Report Authenticated By: Amie Portland, M.D.   Dg Chest Port 1 View  10/04/2012   *RADIOLOGY REPORT*  Clinical Data: Shortness of breath.  PORTABLE CHEST - 1 VIEW  Comparison: 10/02/2012.  Findings: Diffuse slightly asymmetric air space disease similar to the recent examination.  Question pulmonary edema versus infectious infiltrate.  Cardiomegaly post CABG.  Calcified aorta.  IMPRESSION: Diffuse slightly asymmetric air space disease similar to the recent examination.  Question pulmonary edema versus infectious infiltrate. Recommend follow-up until clearance.  Cardiomegaly post CABG.  Calcified aorta.   Original Report Authenticated By: Lacy Duverney, M.D.   US Thoracentesis Asp Pleural Space W/img Guide  09/15/2012   *RADIOLOGY REPORT*  Clinical Data:  Patient with history of prior CABG, COPD, right pleural effusion, dyspnea.  Request is made for therapeutic right thoracentesis.  ULTRASOUND GUIDED  THERAPEUTIC RIGHT THORACENTESIS  An ultrasound guided thoracentesis was thoroughly discussed with the patient and questions answered.  The benefits, risks, alternatives and complications were also discussed.  The patient understands and wishes to proceed with the procedure.  Written consent was obtained.  Ultrasound was performed to localize and mark an adequate pocket of fluid in the right chest.  The area was then prepped and draped in the normal sterile fashion.  1% Lidocaine  was used for local anesthesia.  Under ultrasound guidance a 19 gauge Yueh catheter was introduced.  Thoracentesis was performed.  The catheter was removed and a dressing applied.  Complications:  none  Findings: A total of approximately 150 cc's of blood tinged serous fluid was removed. Due to the multiloculated nature of the collection and despite catheter manipulation, only the above amount of fluid could be aspirated at this time.  IMPRESSION: Successful ultrasound guided therapeutic right thoracentesis yielding 150 cc's of pleural fluid. Due to the multiloculated nature of the collection and small amount of fluid removed follow- up CT chest may be helpful for further characterization.  Read by: Jeananne Rama, P.A.-C  Original Report Authenticated By: Irish Lack, M.D.    PHYSICAL EXAM General: NAD, sleepy Neck: JVP 12 cm, no thyromegaly or thyroid nodule.  Lungs: Decreased breath sounds at bases bilaterally.  CV: Nondisplaced PMI.  Heart regular S1/S2, no S3/S4, 3/6 SEM RUSB.  1+ ankle edema.  No carotid bruit.   Abdomen: Soft, nontender, no hepatosplenomegaly, no distention.  Neurologic: Alert and oriented x 3.  Psych: Normal affect. Extremities: No clubbing or cyanosis.   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 1. 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  He has diuresed well in the hospital.  He continued to diurese well yesterday and weight is down 3 lbs.  He is still volume overloaded, would continue current diuretics if creatinine is stable (no labs back yet) today as he is still volume overloaded and current regimen has been successful.  2. COPD: He is on home oxygen at baseline.  3. CKD: Awaiting today's labs. 4.  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 His HR is fine currently.  Agree with stopping pyridostigmine as he was bradycardic at admission.  So far, not lightheaded with standing.   5. UTI: Enterobacter in urine, Cipro and Bactrim sensitive.  Will treat for complicated UTI given foley, will use Bactrim given potential interaction of Cipro with mycophenolate.  On SS Bactrim, will need to follow creatinine/K with this.  6. CAD: s/p CABG.  Stable, on ASA/Plavix/atorvastatin. 7. Disposition: Very debilitated, will need SNF.  DNR.  Noted palliative care involvement.   John [name] 10/08/2012 7:34 AM

## 2012-10-08 NOTE — Progress Notes (Signed)
Patient ID: John [name]., male   DOB: 1940-10-20, 72 y.o.   MRN: [id number] S:feels better O:BP 127/53  Pulse 108  Temp(Src) 98.2 F (36.8 C) (Oral)  Resp 26  Ht 6\' 2"  (1.88 m)  Wt 89.3 kg (196 lb 13.9 oz)  BMI 25.27 kg/m2  SpO2 94%  Intake/Output Summary (Last 24 hours) at 10/08/12 0853 Last data filed at 10/08/12 0743  Gross per 24 hour  Intake    604 ml  Output   2550 ml  Net  -1946 ml   Intake/Output: I/O last 3 completed shifts: In: 722 [P.O.:660; IV Piggyback:62] Out: 3990 [Urine:3990]  Intake/Output this shift:  Total I/O In: 62 [IV Piggyback:62] Out: -  Weight change: -1.1 kg (-2 lb 6.8 oz) HYQ:MVHQ awake/alert CVS:no rub, III/VI SEM  Resp:decreased BS ION:GEXBMW Ext:tr edema   Recent Labs Lab 10/02/12 1702 10/04/12 1146 10/04/12 2036 10/05/12 0500 10/06/12 0434 10/07/12 0442 10/08/12 0640  NA 136 136  --  138 138 138 141  K 5.9* 5.1  --  5.0 4.2 4.0 3.8  CL 107 104  --  106 102 99 100  CO2 22 21  --  25 25 30  34*  GLUCOSE 181* 92  --  168* 131* 201* 102*  BUN 42* 44*  --  44* 43* 41* 43*  CREATININE 1.5 1.39* 1.44* 1.38* 1.28 1.16 1.25  ALBUMIN  --   --   --  2.5* 2.6* 2.4* 2.3*  CALCIUM 8.9 9.4  --  9.2 9.1 9.3 9.2  AST  --   --   --  14 12 10 11   ALT  --   --   --  5 6 5  <5   Liver Function Tests:  Recent Labs Lab 10/06/12 0434 10/07/12 0442 10/08/12 0640  AST 12 10 11   ALT 6 5 <5  ALKPHOS 172* 168* 156*  BILITOT 1.6* 1.8* 1.4*  PROT 6.8 6.8 6.1  ALBUMIN 2.6* 2.4* 2.3*   No results found for this basename: LIPASE, AMYLASE,  in the last 168 hours No results found for this basename: AMMONIA,  in the last 168 hours CBC:  Recent Labs Lab 10/02/12 1702 10/04/12 1146 10/04/12 2036 10/08/12 0812  WBC 7.7 8.4 6.6 6.4  NEUTROABS 6.9  --   --  4.6  HGB 10.2* 10.7* 10.3* 10.0*  HCT 32.0* 33.1* 33.0* 32.0*  MCV 79.7 79.6 80.7 81.2  PLT 141.0* 126* 132* PENDING   Cardiac Enzymes:  Recent Labs Lab 10/05/12 0050  10/05/12 0500 10/05/12 1435  TROPONINI <0.30 <0.30 <0.30   CBG:  Recent Labs Lab 10/07/12 1541 10/07/12 2129 10/08/12 0033 10/08/12 0351 10/08/12 0758  GLUCAP 191* 189* 138* 98 95    Iron Studies: No results found for this basename: IRON, TIBC, TRANSFERRIN, FERRITIN,  in the last 72 hours Studies/Results: Dg Chest Port 1 View  10/07/2012   *RADIOLOGY REPORT*  Clinical Data: Follow up pulmonary edema  PORTABLE CHEST - 1 VIEW  Comparison: 10/06/2012  Findings: Previous median sternotomy CABG procedure.  There is moderate cardiac enlargement noted. Bilateral pleural effusions and pulmonary edema is unchanged compared with previous exam.  IMPRESSION:  1.  No change in CHF.   Original Report Authenticated By: Signa Kell, M.D.   . ALPRAZolam  0.5 mg Oral BID  . aspirin EC  81 mg Oral Daily  . atorvastatin  40 mg Oral q1800  . calcium carbonate  2 tablet Oral Daily  . carbidopa-levodopa  1 tablet Oral Custom  .  carbidopa-levodopa  1 tablet Oral QAC breakfast  . clopidogrel  75 mg Oral Q breakfast  . furosemide  120 mg Intravenous BID  . HYDROmorphone  2 mg Oral Q12H  . insulin aspart  0-15 Units Subcutaneous Q4H  . insulin glargine  5 Units Subcutaneous Daily  . isosorbide mononitrate  60 mg Oral Daily  . lamoTRIgine  200 mg Oral Daily  . LORazepam  1 mg Oral Once  . magnesium gluconate  500 mg Oral BID  . methylPREDNISolone (SOLU-MEDROL) injection  4 mg Intravenous Daily  . metolazone  5 mg Oral Daily  . mycophenolate  500 mg Oral BID  . pantoprazole  40 mg Oral Q1200  . senna-docusate  1 tablet Oral QHS  . sodium chloride  3 mL Intravenous Q12H  . sulfamethoxazole-trimethoprim  1 tablet Oral Q12H  . tacrolimus  5 mg Oral BID  . tamsulosin  0.8 mg Oral Daily  . Umeclidinium-Vilanterol  1 puff Inhalation Daily  . Vilazodone HCl  20 mg Oral Daily  . vitamin C  1,000 mg Oral Daily    BMET    Component Value Date/Time   NA 141 10/08/2012 0640   K 3.8 10/08/2012 0640    CL 100 10/08/2012 0640   CO2 34* 10/08/2012 0640   GLUCOSE 102* 10/08/2012 0640   BUN 43* 10/08/2012 0640   CREATININE 1.25 10/08/2012 0640   CALCIUM 9.2 10/08/2012 0640   GFRNONAA 56* 10/08/2012 0640   GFRAA 65* 10/08/2012 0640   CBC    Component Value Date/Time   WBC 6.4 10/08/2012 0812   RBC 3.94* 10/08/2012 0812   RBC 3.70* 03/24/2009 0357   HGB 10.0* 10/08/2012 0812   HCT 32.0* 10/08/2012 0812   PLT PENDING 10/08/2012 0812   MCV 81.2 10/08/2012 0812   MCH 25.4* 10/08/2012 0812   MCHC 31.3 10/08/2012 0812   RDW 17.3* 10/08/2012 0812   LYMPHSABS 1.1 10/08/2012 0812   MONOABS 0.6 10/08/2012 0812   EOSABS 0.2 10/08/2012 0812   BASOSABS 0.0 10/08/2012 0812     Impression:  1. Acute on chronic resp failure- diuresing 2. COPD / home O2 3. Renal transplant 2002 baseline creat 1.0-1.6 4. Combined syst/diast heart failure, EF 40-45% 5. Recurrent loculated R pleural effusion 6. Parkinson's disease 7. Chronic debility 8. CAD w CABG 2011 9. 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 Severe debilitation 11. ACDz Plan:  1. Would change diuretic regimen to po lasix and cont to follow UOP/Scr 2. Agree with DNR and weighing QOL issues 3. Watch K and Scr while on bactrim which interferes with Scr excretion/secretion and will need to dose renal with SS bid not DS bid. 4.   Leni Pankonin A

## 2012-10-08 NOTE — Progress Notes (Signed)
1415- Pt found climbing out of the bed and yelling "help me, Help me." RN at bedside and placed pt back in bed. Pt reoriented to surroundings and states "there is just something not right." Pt has been increasingly confused as this shift has progressed. Wife states this is normal for pt and has periods of disorientation. Dr Joseph Art informed and new orders received. Pt's wife informed of POC and waiting on Head CT. 1445- EKG completed. Pt denies Chest pain or SOB, Dr Joseph Art informed of pt's results, which are unchanged since previous EKG.

## 2012-10-09 LAB — COMPREHENSIVE METABOLIC PANEL
ALT: <5 U/L (ref 0–53)
AST: 11 U/L (ref 0–37)
Albumin: 2.5 g/dL — ABNORMAL LOW (ref 3.5–5.2)
Alkaline Phosphatase: 160 U/L — ABNORMAL HIGH (ref 39–117)
BUN: 50 mg/dL — ABNORMAL HIGH (ref 6–23)
CO2: 33 mEq/L — ABNORMAL HIGH (ref 19–32)
Calcium: 9.2 mg/dL (ref 8.4–10.5)
Chloride: 98 mEq/L (ref 96–112)
Creatinine, Ser: 1.6 mg/dL — ABNORMAL HIGH (ref 0.50–1.35)
GFR calc Af Amer: 48 mL/min — ABNORMAL LOW (ref >90)
GFR calc non Af Amer: 41 mL/min — ABNORMAL LOW (ref >90)
Glucose, Bld: 145 mg/dL — ABNORMAL HIGH (ref 70–99)
Potassium: 4 mEq/L (ref 3.5–5.1)
Sodium: 140 mEq/L (ref 135–145)
Total Bilirubin: 1 mg/dL (ref 0.3–1.2)
Total Protein: 6.2 g/dL (ref 6.0–8.3)

## 2012-10-09 LAB — MAGNESIUM: Magnesium: 1.6 mg/dL (ref 1.5–2.5)

## 2012-10-09 LAB — BASIC METABOLIC PANEL
BUN: 50 mg/dL — ABNORMAL HIGH (ref 6–23)
Calcium: 9.3 mg/dL (ref 8.4–10.5)
GFR calc non Af Amer: 41 mL/min — ABNORMAL LOW (ref >90)
Glucose, Bld: 153 mg/dL — ABNORMAL HIGH (ref 70–99)
Potassium: 4 mEq/L (ref 3.5–5.1)

## 2012-10-09 LAB — GLUCOSE, CAPILLARY: Glucose-Capillary: 150 mg/dL — ABNORMAL HIGH (ref 70–99)

## 2012-10-09 MED ORDER — PREDNISONE 5 MG PO TABS
5.0000 mg | ORAL_TABLET | Freq: Every day | ORAL | Status: DC
  Administered 2012-10-10: 5 mg via ORAL
  Filled 2012-10-09 (×3): qty 1

## 2012-10-09 MED ORDER — IPRATROPIUM BROMIDE 0.02 % IN SOLN
0.5000 mg | Freq: Two times a day (BID) | RESPIRATORY_TRACT | Status: DC
  Administered 2012-10-09 – 2012-10-10 (×3): 0.5 mg via RESPIRATORY_TRACT
  Filled 2012-10-09 (×3): qty 2.5

## 2012-10-09 MED ORDER — INSULIN ASPART 100 UNIT/ML ~~LOC~~ SOLN
0.0000 [IU] | Freq: Three times a day (TID) | SUBCUTANEOUS | Status: DC
  Administered 2012-10-10: 3 [IU] via SUBCUTANEOUS
  Administered 2012-10-10: 2 [IU] via SUBCUTANEOUS

## 2012-10-09 MED ORDER — FUROSEMIDE 80 MG PO TABS
120.0000 mg | ORAL_TABLET | Freq: Two times a day (BID) | ORAL | Status: DC
  Administered 2012-10-09 – 2012-10-10 (×3): 120 mg via ORAL
  Filled 2012-10-09 (×5): qty 1

## 2012-10-09 MED ORDER — FUROSEMIDE 10 MG/ML IJ SOLN: 120.0000 mg | Freq: Two times a day (BID) | INTRAVENOUS | Status: DC

## 2012-10-09 NOTE — Progress Notes (Signed)
Patient has arrived on the unit, report received from 2900 nurse; will continue to monitor patient. Lorretta Harp RN

## 2012-10-09 NOTE — Progress Notes (Signed)
Met with patient, his wife and daughter to further discuss goals of care. Patient continues to have a difficult time coping with his current debilitated condition and overall poor prognosis-he continues to ask "am I dying" and cannot move beyond needing a definitive time frame which I most definitely cannot provide him with at this time. I am trying to help him re-frame his thinking and coping towards choices about how he lives and perceptions/tolerability about his current state of suffering while upholding his hope in the face of his very severe disease processes that will eventually lead to his death. This is a gentleman who has defied death on at least 2-3 different occasions so the reality that his decline may be slow, gradual and frought with debility has caused him a huge amount of anxiety and emotional suffering.  Summary of Discussion:  1. DNR confirmed with patient. 2. Focus on Comfort First and Aggressive medical intervention as close second-will weigh options. 3. Patient cannot at this time establish specific goals, so I discussed with him that teh default position would be to continue to medically maximize his treatment and to then begin to look at SNF or CIR-his last stay at [facility] was difficult and he was not able to handle hours of rehab-he may be in an even more debilitated state at this point however. CIR eval per primary service when medically appropriate. He has been in SNF on prior occasions for rehab. 4. Continue to use low dose Dilaudid for pain and dyspnea- he has not had the strength to use his Arrono inhaler so I have changed him to Brovana and Ipratropium Nebs. I will attempt to contact Pulmonary per family request-they have already called office.  Anderson Malta, DO Palliative Medicine

## 2012-10-09 NOTE — Consult Note (Signed)
PULMONARY  / CRITICAL CARE MEDICINE  Name: John [name]. MRN: [id number] DOB: January 18, 1941    ADMISSION DATE:  10/04/2012 CONSULTATION DATE:  10/09/2012  REFERRING MD :  [facility] PRIMARY SERVICE:  TRH  CHIEF COMPLAINT:  dyspnea   HISTORY OF PRESENT ILLNESS:   72 y.o. WM with Gold D copd oxygen dependent. Adm with CHF, UTI, sepsis, resp failure. Pt is slowly better. Pt is DNI/DNR.  Pt seen at request of family.  Pt in SDU of 2H. Pt and spouse open to hospice after snf rehab stay.    PAST MEDICAL HISTORY :  Past Medical History  Diagnosis Date  . CAD (coronary artery disease)     a. myoview 1/11: lg inf fixed defect;   b. cath 1/11: 3v CAD,  c. s/p CABG 1/11 c/b intubation, acute renal failure requiring CVVH and AFib  . Atrial fibrillation     a. Post-op CABG.  . Chronic diastolic heart failure     a. echo 4/11: EF 45-50%, mod LVH, LAE, inf and septal HK  . Orthostatic hypotension     a. Shy-Drager syndrome with orthostatic hypotension: improvement with pyridostigmine.   Marland Kitchen History of renal transplant     a. On chronic immunosupp.  Marland Kitchen HTN (hypertension)   . HLD (hyperlipidemia)   . PAD (peripheral artery disease)     a. atherectomy right SFA and right popliteal on 01/12/10. Complicated by left groin pseudoaneurysm. ABIs (3/14) with 0.85 R, 0.76 L (stable).   . ED (erectile dysfunction)   . DDD (degenerative disc disease), lumbar   . Obesity   . Low testosterone   . Pleural effusion, right     a. Loculated right>left pleural effusion: Right thoracentesis (6/12), cytology negative for malignancy.  b. Thoracentesis 09/15/2012  . COPD (chronic obstructive pulmonary disease)     Followed by Dr Delford Field, now on home oxygen.   Marland Kitchen Anxiety   . Movement disorder   . Abnormal ultrasound of carotid artery     a. Carotid dopplers (6/12): 0-39% bilateral ICA stenosis.   . Depression   . Heart block     a. Patient has had profound 1st degree AV block on ECG. Holter (11/12) showed NSR, 1st  degree heart block, occasional short runs of type I 2nd degree heart block. Holter (3/14) showed type I 2nd degree AV block. b. Type 1 2nd degree AV block noted 09/2012 admission.  . Aortic stenosis     a. Moderate on 3/14 echo.   . Parkinson's disease   . Complication of anesthesia     "respiratory crisis after heart and lumbar ORs; I can not have general anesthesia" (10/04/2012)  . CHF (congestive heart failure)   . Exertional shortness of breath   . On home oxygen therapy     "4L pretty much 24/7" (10/04/2012)  . DM2 (diabetes mellitus, type 2)     a. H/o insulin pump.  . Diabetic gastroparesis   . Iron deficiency anemia     "has had a couple infusions recently" (10/04/2012)  . History of blood transfusion     "several over a large # of years" (10/04/2012)  . Chronic kidney disease     s/p renal transplant 2002  . Prostate cancer     a. s/p seed implantation in 1/06.  . Skin cancer     "on my face" (10/04/2012)   Past Surgical History  Procedure Laterality Date  . Cataract extraction w/ intraocular lens  implant, bilateral Bilateral   .  Vitrectomy Left   . Tonsillectomy    . Kidney transplant  08/21/2000  . Radioactive seed implant  2006    "brachytherapy for prostate" (10/04/2012)  . Lumbar laminectomy/decompression microdiscectomy  05/16/2011    Procedure: LUMBAR LAMINECTOMY/DECOMPRESSION MICRODISCECTOMY 1 LEVEL;  Surgeon: Barnett Abu, MD;  Location: [facility];  Service: Neurosurgery;  Laterality: Bilateral;  Bilateral Lumbar Five-Sacral One Laminectomy  . Appendectomy    . Inguinal hernia repair    . Coronary artery bypass graft  03/2009    CABG "X4 or 5" (10/04/2012)  . Cardiac catheterization  03/2009  . Peritoneal catheter insertion  03/2000  . Peritoneal catheter removal  08/21/2000  . Mohs surgery      "couple places on face" (8/15//2014)   Prior to Admission medications   Medication Sig Start Date End Date Taking? Authorizing Provider  albuterol (PROVENTIL) (2.5 MG/3ML)  0.083% nebulizer solution Take 2.5 mg by nebulization every 6 (six) hours as needed for wheezing. 09/11/12  Yes Storm Frisk, MD  aspirin 81 MG tablet Take 81 mg by mouth daily.   Yes Historical Provider, MD  atorvastatin (LIPITOR) 40 MG tablet Take 40 mg by mouth daily.    Yes Historical Provider, MD  calcium carbonate (OS-CAL - DOSED IN MG OF ELEMENTAL CALCIUM) 1250 MG tablet Take 2 tablets by mouth daily.   Yes Historical Provider, MD  carbidopa-levodopa (SINEMET CR) 50-200 MG per tablet Take 0.5-1 tablets by mouth 2 (two) times daily. Take one-half tablet in the morning,One tablet at 11 am before lunchtime,and 1 tablet at 5 pm before dinner.   Yes Historical Provider, MD  Cholecalciferol (VITAMIN D-3) 5000 UNITS TABS Take 2 tablets by mouth 3 (three) times a week.   Yes Historical Provider, MD  clopidogrel (PLAVIX) 75 MG tablet Take 75 mg by mouth daily.   Yes Historical Provider, MD  ezetimibe (ZETIA) 10 MG tablet Take 10 mg by mouth daily.    Yes Historical Provider, MD  Ferrous Sulfate (IRON) 325 (65 FE) MG TABS Take 1 tablet by mouth daily.    Yes Historical Provider, MD  folic acid (FOLVITE) 400 MCG tablet Take 400 mcg by mouth daily.   Yes Historical Provider, MD  isosorbide mononitrate (IMDUR) 60 MG 24 hr tablet Take 60 mg by mouth daily. 03/01/12  Yes Laurey Morale, MD  lamoTRIgine (LAMICTAL) 200 MG tablet Take 200 mg by mouth daily.   Yes Historical Provider, MD  Multiple Vitamin (MULITIVITAMIN WITH MINERALS) TABS Take 1 tablet by mouth daily.   Yes Historical Provider, MD  mycophenolate (CELLCEPT) 500 MG tablet Take 500 mg by mouth 2 (two) times daily.    Yes Historical Provider, MD  pantoprazole (PROTONIX) 40 MG tablet Take 40 mg by mouth daily. 08/09/11  Yes Storm Frisk, MD  predniSONE (DELTASONE) 5 MG tablet Take 5 mg by mouth daily. HOLD while on 10mg  prednisone then resume when 10mg  is completed 09/11/12  Yes Storm Frisk, MD  pyridostigmine (MESTINON) 60 MG tablet Take  60 mg by mouth 2 (two) times daily. Take 2 tablets by mouth in the morning, and take 1 tablet by mouth in the afternoon.   Yes Historical Provider, MD  tacrolimus (PROGRAF) 5 MG capsule Take 5 mg by mouth 2 (two) times daily.    Yes Historical Provider, MD  Tamsulosin HCl (FLOMAX) 0.4 MG CAPS Take 0.8 mg by mouth daily.    Yes Historical Provider, MD  traMADol (ULTRAM) 50 MG tablet Take 50 mg by  mouth as needed for pain.  03/02/12  Yes Historical Provider, MD  Umeclidinium-Vilanterol 62.5-25 MCG/INH AEPB Inhale 1 puff into the lungs daily. 08/13/12  Yes Storm Frisk, MD  Vilazodone HCl (VIIBRYD) 40 MG TABS Take 20 mg by mouth daily.    Yes Historical Provider, MD  vitamin C (ASCORBIC ACID) 500 MG tablet Take 1,000 mg by mouth daily.    Yes Historical Provider, MD  albuterol (PROVENTIL HFA;VENTOLIN HFA) 108 (90 BASE) MCG/ACT inhaler Inhale 2 puffs into the lungs every 6 (six) hours as needed for wheezing or shortness of breath. 10/27/11   Storm Frisk, MD   No Known Allergies  FAMILY HISTORY:  Family History  Problem Relation Age of Onset  . Coronary artery disease Mother   . Valvular heart disease Father   . Hepatitis      sibling  . Coronary artery disease Sister   . Heart attack Sister   . Tuberculosis Father    SOCIAL HISTORY:  reports that he quit smoking about 34 years ago. His smoking use included Cigarettes. He has a 50 pack-year smoking history. He has never used smokeless tobacco. He reports that he does not drink alcohol or use illicit drugs.     VITAL SIGNS: Temp:  [97.6 F (36.4 C)-98.2 F (36.8 C)] 98 F (36.7 C) (08/20 1245) Resp:  [18-25] 20 (08/20 1245) BP: (114-146)/(39-70) 119/45 mmHg (08/20 1245) SpO2:  [95 %-100 %] 96 % (08/20 1245) Weight:  [87.6 kg (193 lb 2 oz)] 87.6 kg (193 lb 2 oz) (08/20 0314)  PHYSICAL EXAMINATION: General:  Ill appearing in nad Neuro:  Non focal, alert HEENT:  No jvd no tmg Cardiovascular:  Rrr, nl s1 s2 no s3 s4  Lungs:   Distant bs Abdomen:  Soft nt Musculoskeletal:  From, no joint issues Skin:  ecchymoses   Recent Labs Lab 10/08/12 0640 10/09/12 0648 10/09/12 0947  NA 141 140 139  K 3.8 4.0 4.0  CL 100 98 97  CO2 34* 33* 31  BUN 43* 50* 50*  CREATININE 1.25 1.60* 1.61*  GLUCOSE 102* 145* 153*    Recent Labs Lab 10/04/12 1146 10/04/12 2036 10/08/12 0812  HGB 10.7* 10.3* 10.0*  HCT 33.1* 33.0* 32.0*  WBC 8.4 6.6 6.4  PLT 126* 132* 111*   Ct Head Wo Contrast  10/08/2012   *RADIOLOGY REPORT*  Clinical Data: Altered mental status.  CT HEAD WITHOUT CONTRAST  Technique:  Contiguous axial images were obtained from the base of the skull through the vertex without contrast.  Comparison: Head CT scan 03/11/2009.  Findings: There is some cortical atrophy and chronic microvascular ischemic change.  No evidence of acute abnormality including infarct, hemorrhage, mass lesion, mass effect, midline shift or abnormal extra-axial fluid collection is identified.  The calvarium is intact.  No hydrocephalus or pneumocephalus.  IMPRESSION: No acute finding.  Stable compared to prior exam.   Original Report Authenticated By: Holley Dexter, M.D.    ASSESSMENT / PLAN:  #1 Gold D COPD.  Stable at present. Cont neb meds and oxygen as Rx  #2 CHF  Per primary team and cardiology  #3ARF on CKD  With renal TPL Per rena  #4 UTI Per trh  #5 EOL issues.   Pall care f/u. Case discussed with golding. Pt and spouse agreeable to hospice in the home later after snf rehab d/c   Dorcas Carrow Pulmonary and Critical Care Medicine [facility] Pager: [phone]  10/09/2012, 3:17 PM

## 2012-10-09 NOTE — Progress Notes (Signed)
Pt noted having increased anxiety and agitation with respirations noted 22-28 with use of accessory muscles. PRN Dilaudid given and noted effective.

## 2012-10-09 NOTE — Progress Notes (Signed)
Occupational Therapy Evaluation Patient Details Name: John [name]. MRN: [id number] DOB: Dec 31, 1940 Today's Date: 10/09/2012 Time: 1308-6578 OT Time Calculation (min): 14 min  OT Assessment / Plan / Recommendation History of present illness 72 y.o. male [name] is a 72 y/o M with complex PMH including CAD s/p CABG 2011 (post-op course complicated by pulmonary edema requiring intubation, renal failure requiring CVVH, and atrial fibrillation), PAD, renal transplantation on immunosuppressent, orthostatic hypotension/ShyDrager, Parkinson's disease, prostate CA, mod AS, pleural effusion s/p thoracenteses (recently 08/2012), COPD with chronic resp failure on home O2 at 4l/min and chronic prednisone, intermittent heart block (1st degree, type 1 2nd degree), and mod aortic stenosis who presented to [facility] today with dyspnea.     Clinical Impression   Pt with limited participation during eval. Pt briefly sat EOB and reclined back to bed. PTA, pt's wife assisted with ADL, however, pt demonstrates a functional decline as compared to his baseline. Pt is not appropriate for CIR. Rec SNF for rehab prior to return home. Pt will benefit from skilled OT services to facilitate D/C to next venue due to below deficits.    OT Assessment  Patient needs continued OT Services    Follow Up Recommendations  SNF    Barriers to Discharge      Equipment Recommendations  None recommended by OT    Recommendations for Other Services    Frequency  Min 2X/week    Precautions / Restrictions Precautions Precautions: Fall Restrictions Weight Bearing Restrictions: No   Pertinent Vitals/Pain no apparent distress     ADL  ADL Comments: currently max assist with bathing and dressing. Able to self feed    OT Diagnosis: Generalized weakness;Acute pain  OT Problem List: Decreased strength;Decreased activity tolerance;Decreased safety awareness;Cardiopulmonary status limiting activity;Pain OT Treatment  Interventions: Self-care/ADL training;Therapeutic exercise;Energy conservation;DME and/or AE instruction;Therapeutic activities;Patient/family education   OT Goals(Current goals can be found in the care plan section) Acute Rehab OT Goals Patient Stated Goal: to stay in bed OT Goal Formulation: Patient unable to participate in goal setting Time For Goal Achievement: 10/23/12 Potential to Achieve Goals: Fair  Visit Information  Last OT Received On: 10/09/12 Assistance Needed: +2 (OOB mobility) Reason Eval/Treat Not Completed: Medical issues which prohibited therapy History of Present Illness: 72 y.o. male [name] is a 72 y/o M with complex PMH including CAD s/p CABG 2011 (post-op course complicated by pulmonary edema requiring intubation, renal failure requiring CVVH, and atrial fibrillation), PAD, renal transplantation on immunosuppressent, orthostatic hypotension/ShyDrager, Parkinson's disease, prostate CA, mod AS, pleural effusion s/p thoracenteses (recently 08/2012), COPD with chronic resp failure on home O2 at 4l/min and chronic prednisone, intermittent heart block (1st degree, type 1 2nd degree), and mod aortic stenosis who presented to [facility] today with dyspnea.         Prior Functioning     Home Living Family/patient expects to be discharged to:: Private residence Living Arrangements: Spouse/significant other Available Help at Discharge: Family;Available 24 hours/day Type of Home: House Home Access: Stairs to enter [employer] of Steps: 4 Entrance Stairs-Rails: Right;Left Home Layout: Two level;Able to live on main level with bedroom/bathroom Alternate Level Stairs-Number of Steps:  (has stair lift) Home Equipment: Walker - 2 wheels;Tub bench Additional Comments: was at out patient neuro PT Prior Function Level of Independence: Independent with assistive device(s);Needs assistance Gait / Transfers Assistance Needed: uses RW ADL's / Homemaking Assistance Needed: wife  assist with bathing/dressing Communication Communication: No difficulties Dominant Hand: Right  Vision/Perception     Cognition  Cognition Arousal/Alertness: Awake/alert Behavior During Therapy: Flat affect Overall Cognitive Status: No family/caregiver present to determine baseline cognitive functioning    Extremity/Trunk Assessment Upper Extremity Assessment Upper Extremity Assessment: Generalized weakness Lower Extremity Assessment Lower Extremity Assessment: Generalized weakness Cervical / Trunk Assessment Cervical / Trunk Assessment: Normal     Mobility Bed Mobility Bed Mobility: Supine to Sit;Sitting - Scoot to Edge of Bed Supine to Sit: 3: Mod assist;HOB elevated;With rails Sitting - Scoot to Edge of Bed: 4: Min guard Sit to Supine: 2: Max assist Details for Bed Mobility Assistance: max A Transfers Sit to Stand: 1: +2 Total assist;With upper extremity assist;From bed Sit to Stand: Patient Percentage: 70% Stand to Sit: 1: +2 Total assist;With upper extremity assist;With armrests;To chair/3-in-1 Stand to Sit: Patient Percentage: 60% Details for Transfer Assistance: Cues for sequencing & technique.  (A) to achieve standing, balance, & controlled descent.  Pt able to take pivotal steps from bed>recliner.       Exercise     Balance Balance Balance Assessed: Yes   End of Session OT - End of Session Activity Tolerance: Patient limited by fatigue Patient left: in bed;with call bell/phone within reach Nurse Communication: Mobility status  GO     Jacorian Golaszewski,HILLARY 10/09/2012, 2:42 PM [facility], OTR/L  [phone] 10/09/2012

## 2012-10-09 NOTE — Progress Notes (Signed)
Physical Therapy Treatment Patient Details Name: John [name]. MRN: [id number] DOB: 12-16-1940 Today's Date: 10/09/2012 Time: 2458-0998 PT Time Calculation (min): 26 min  PT Assessment / Plan / Recommendation  History of Present Illness 72 y.o. 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     PT Comments   Pt progressing with mobility today as evident in ability to take pivotal steps bed>recliner but activity is limited due to fatigue & decr Sp02 on 5L 02 via [place].       Follow Up Recommendations  SNF;Supervision/Assistance - 24 hour     Does the patient have the potential to tolerate intense rehabilitation     Barriers to Discharge        Equipment Recommendations  None recommended by PT    Recommendations for Other Services    Frequency Min 3X/week   Progress towards PT Goals Progress towards PT goals: Progressing toward goals  Plan Current plan remains appropriate    Precautions / Restrictions Precautions Precautions: Fall Restrictions Weight Bearing Restrictions: No   Pertinent Vitals/Pain decr Sp02 low 70's 5L 02 via [place] with activity.      Mobility  Bed Mobility Bed Mobility: Supine to Sit;Sitting - Scoot to Edge of Bed Supine to Sit: 3: Mod assist;HOB elevated;With rails Sitting - Scoot to Edge of Bed: 4: Min guard Details for Bed Mobility Assistance: (A) to lift shoulders/trunk to sitting upright.  Sp02 mid 70's with transitioning to EOB.   Required several minutes to recover.     Transfers Transfers: Sit to Stand;Stand to Sit;Stand Pivot Transfers Sit to Stand: 1: +2 Total assist;With upper extremity assist;From bed Sit to Stand: Patient Percentage: 70% Stand to Sit: 1: +2 Total assist;With upper extremity assist;With armrests;To chair/3-in-1 Stand to Sit: Patient Percentage: 60% Stand Pivot Transfers: 1: +2 Total assist Stand Pivot Transfers: Patient Percentage: 60% Details for Transfer Assistance: Cues for sequencing & technique.  (A) to achieve standing, balance, & controlled descent.  Pt able to take pivotal steps from bed>recliner.   Ambulation/Gait Ambulation/Gait Assistance: Not tested (comment) Stairs: No Wheelchair Mobility Wheelchair Mobility: No      PT Goals (current goals can now be found in the care plan section) Acute Rehab PT Goals PT Goal Formulation: With patient/family Time For Goal Achievement: 10/14/12 Potential to Achieve Goals: Good  Visit Information  Last PT Received On: 10/09/12 Assistance Needed: +2 (OOB mobility) History of Present Illness: 72 y.o. 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      Subjective Data      Cognition  Cognition Arousal/Alertness: Awake/alert Behavior During Therapy: Flat affect  Balance     End of Session PT - End of Session Equipment Utilized During Treatment: Gait belt;Oxygen Activity Tolerance: Other (comment);Patient limited  by fatigue (decreased 02 sats 5L 02) Patient left: in chair;with call bell/phone within reach;with family/visitor present Nurse Communication: Mobility status   GP     Lara Mulch 10/09/2012, 1:56 PM   Verdell Face, PTA [phone] 10/09/2012

## 2012-10-09 NOTE — Clinical Social Work Note (Signed)
Clinical Social Work Department BRIEF PSYCHOSOCIAL ASSESSMENT 10/09/2012  Patient:  AYAAN, SHUTES     Account Number:  [id number]     Admit date:  10/04/2012  Clinical Social Worker:  Hulan Fray  Date/Time:  10/09/2012 03:35 PM  Referred by:  CSW  Date Referred:  10/09/2012 Referred for  SNF Placement   Other Referral:   Interview type:  Family Other interview type:   Wife- Janus Molder    PSYCHOSOCIAL DATA Living Status:  WIFE Admitted from facility:   Level of care:   Primary support name:  Janus Molder Primary support relationship to patient:  SPOUSE Degree of support available:   supportive    CURRENT CONCERNS Current Concerns  Post-Acute Placement   Other Concerns:    SOCIAL WORK ASSESSMENT / PLAN Clinical Social Worker reviewed chart and noticed PT/OT's recommendation for short term SNF placement. CSW was able to reach wife by phone and she reported that she was not ready for this conversation yet. CSW explained the need to discuss disposition and wife reported that she understood and was told that patient was transferring to a different unit. CSW inquired if patient had been to any previous SNF's and wife reported that patient was at [facility] and they were pleased with the services. Wife also reported that patient was in CIR previously as well. Wife was agreeable for CSW to intiate SNF search in [facility] and preferred private rooms if available. Wife reported that she was on her way back to the hospital today. CSW will complete FL2 for MD's signature and continue to follow and update patient and wife when bed offers are made.   Assessment/plan status:  Psychosocial Support/Ongoing Assessment of Needs Other assessment/ plan:   Information/referral to community resources:   CSW will leave SNF packet in patient's room.    PATIENT'S/FAMILY'S RESPONSE TO PLAN OF CARE: Wife reported that she is agreeable for CSW to intiate SNF search in [place]. Wife initially appeared surpised about the convesation regarding SNF placement process.

## 2012-10-09 NOTE — Progress Notes (Signed)
[facility] Progress Note [place] TEAM 1 - Stepdown/ICU TEAM   Alvera Novel. 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   The patient underwent R thoracentesis 09/15/12 for loculated pleural effusion. He also recently completed a course of Augmentin and a prednisone taper for dyspnea. Over 10 days he developed significant DOE, dry cough, LEE, and increase in abdominal girth. Decreased UOP was noted per wife. He was evaluated in the pulmonary office at which time Lasix was increased to 80mg  BID. LE dopplers were negative for DVT. He saw Dr. Abel Presto and K was discontinued due to level 5.7, prednisone was decreased from 10->5mg , and Lasix was increased to 120mg  BID. He was still significantly SOB so his wife drove him to the ER. He was hypoxic at 87% on [place] thus was placed on bipap. He had been intermittently bradycardic down to the mid 30's in [place]. At times when dropping slower he has a 2:1 pattern but per discussion with Dr. Graciela Husbands with EP, this is likely [place] as well given surrounding rhythm. pBNP 6763, troponin neg x 1, BUN/Cr 44/1.39, K 5.1, Hgb 10.7/plt 126. CXR shows diffuse slightly asymmetric air space disease similar to the recent examination, question pulmonary edema versus infectious infiltrate, cardiomegaly post CABG, calcified aorta. He denied any fever, chills, orthopnea, CP, palpitations, or  syncope. He received 80mg  IV Lasix and atrovent/proventil neb in the ER. Patient seen by Cardiology in the ED.   Patient initially was admitted to 4 E, but rapid response team was called when patient went into respiratory distress and patient was transferred to 2900 to initiate BIPAP tx.  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 Delford Field - Palliative Care to be consulted, with goal for home Hospice involvement post-D/C   DM type 2 insulin pump was disconnected and taken home - cont to follow CBG w/o change today   Parkinson's disease Cont home medical regimen  CAD w CABG 2011  Pulmonary hypertension  Recurrent loculated R pleural effusion  Stable   HYPERLIPIDEMIA  Code Status: NO CODE BLUE Family Communication: spoke w/ wife at bedside  Disposition Plan: transfer to tele bed/CHF floor   Consultants: Cardiology Nephrology Pulmonology   Procedures: Echocardiogram 10/05/2012 - Comparison to prior study March  2014. There is mild LVH with LVEF 55%, mld inferior hypokinesis, indeterminate diastolic function. MIld biatrial enlargement. Overall moderate aortic stenosis, functionally bicuspid valve - similar gradients.  RV enlargement and dysfunction with PASP now increased to 64 mmHg, CVP elevated.  Antibiotics: Bactrim 8/18 >>   DVT prophylaxis: SCDs  HPI/Subjective: Pt is awake and conversant, though mildly confused. He denies any problems whatsoever, but his hx is not felt to be reliable due to his confusion.   Objective: Blood pressure 141/70, pulse 108, temperature 97.6 F (36.4 C), temperature source Oral, resp. rate 25, height 6\' 2"  (1.88 m), weight 87.6 kg (193 lb 2 oz), SpO2 95.00%.  Intake/Output Summary (Last 24 hours) at 10/09/12 1138 Last data filed at 10/09/12 1100  Gross per 24 hour  Intake    460 ml  Output   1260 ml  Net   -800 ml   Exam: General: No acute respiratory distress at rest  Lungs: distant bs th/o all fields - no active wheeze  Cardiovascular: Regular rate without gallop or rub normal S1 and S2 Abdomen: Nontender, nondistended, soft, bowel sounds positive, no rebound, no ascites, no appreciable mass Extremities: No significant cyanosis, clubbing, or edema bilateral lower extremities  Data Reviewed: Basic Metabolic Panel:  Recent Labs Lab 10/05/12 0500 10/06/12 0434 10/07/12 0442 10/08/12 0640 10/09/12 0648 10/09/12 0947  NA 138 138 138 141 140 139  K 5.0 4.2 4.0 3.8 4.0 4.0  CL 106 102 99 100 98 97  CO2 25 25 30  34* 33* 31  GLUCOSE 168* 131* 201* 102* 145* 153*  BUN 44* 43* 41* 43* 50* 50*  CREATININE 1.38* 1.28 1.16 1.25 1.60* 1.61*  CALCIUM 9.2 9.1 9.3 9.2 9.2 9.3  MG 1.9 1.7 1.5 1.4* 1.6  --    Liver Function Tests:  Recent Labs Lab 10/05/12 0500 10/06/12 0434 10/07/12 0442 10/08/12 0640 10/09/12 0648  AST 14 12 10 11 11   ALT 5 6 5  <5 <5  ALKPHOS 168* 172* 168* 156* 160*  BILITOT 1.2 1.6* 1.8* 1.4* 1.0  PROT 6.3 6.8 6.8 6.1 6.2  ALBUMIN 2.5* 2.6* 2.4* 2.3* 2.5*   CBC:  Recent Labs Lab 10/02/12 1702 10/04/12 1146 10/04/12 2036 10/08/12 0812  WBC 7.7 8.4 6.6 6.4  NEUTROABS 6.9  --   --  4.6  HGB 10.2* 10.7* 10.3* 10.0*  HCT  32.0* 33.1* 33.0* 32.0*  MCV 79.7 79.6 80.7 81.2  PLT 141.0* 126* 132* 111*   Cardiac Enzymes:  Recent Labs Lab 10/05/12 0050 10/05/12 0500 10/05/12 1435  TROPONINI <0.30 <0.30 <0.30   BNP (last 3 results)  Recent Labs  05/20/12 0926 10/02/12 1702 10/04/12 1146  PROBNP 513.0* 662.0* 6763.0*   CBG:  Recent Labs Lab 10/08/12 1719 10/08/12 2003 10/08/12 2331 10/09/12 0308 10/09/12 0820  GLUCAP 208* 170* 188* 165* 130*    Recent Results (from the past 240 hour(s))  URINE CULTURE     Status: None   Collection Time    10/04/12  2:36 PM      Result Value Range Status   Specimen Description URINE, CATHETERIZED   Final   Special Requests NONE   Final   Culture  Setup Time     Final   Value: 10/04/2012 16:21     Performed at [facility] Count     Final   Value: >=100,000 COLONIES/ML     Performed at [facility]   Culture     Final   Value:  ENTEROBACTER CLOACAE     Performed at [facility]   Report Status 10/07/2012 FINAL   Final   Organism ID, Bacteria ENTEROBACTER CLOACAE   Final     Studies:  Recent x-ray studies have been reviewed in detail by the Attending Physician  Scheduled Meds:  Scheduled Meds: . ALPRAZolam  0.25 mg Oral BID  . arformoterol  15 mcg Nebulization BID  . aspirin EC  81 mg Oral Daily  . atorvastatin  40 mg Oral q1800  . calcium carbonate  2 tablet Oral Daily  . carbidopa-levodopa  1 tablet Oral Custom  . carbidopa-levodopa  1 tablet Oral QAC breakfast  . clopidogrel  75 mg Oral Q breakfast  . furosemide  120 mg Oral BID  . HYDROmorphone  2 mg Oral Q12H  . insulin aspart  0-15 Units Subcutaneous Q4H  . insulin glargine  10 Units Subcutaneous Daily  . ipratropium  0.5 mg Nebulization BID  . isosorbide mononitrate  60 mg Oral Daily  . lamoTRIgine  200 mg Oral Daily  . LORazepam  1 mg Oral Once  . magnesium gluconate  500 mg Oral BID  . methylPREDNISolone (SOLU-MEDROL) injection  4 mg Intravenous  Daily  . mycophenolate  500 mg Oral BID  . nystatin-triamcinolone ointment   Topical QID  . pantoprazole  40 mg Oral Q1200  . senna-docusate  1 tablet Oral QHS  . sodium chloride  3 mL Intravenous Q12H  . sulfamethoxazole-trimethoprim  1 tablet Oral Q12H  . tacrolimus  5 mg Oral BID  . tamsulosin  0.8 mg Oral Daily  . Vilazodone HCl  20 mg Oral Daily  . vitamin C  1,000 mg Oral Daily   Time spent on care of this patient:   [facility] T  [facility] Office  [phone] Pager - Text Page per Loretha Stapler as per below:  On-Call/Text Page:      [email or web address]      password [email or web address]  If 7PM-7AM, please contact night-coverage [email or web address] Password [facility] 10/09/2012, 11:38 AM   LOS: 5 days

## 2012-10-09 NOTE — Progress Notes (Signed)
NUTRITION FOLLOW UP  Intervention:   1. General healthful diet; continue to encourage intake as able.  Pt continues to be confused and anxious at times.  2.  Enteral nutrition; please consult RD if more aggressive nutrition intervention such as enteral nutrition warranted for pt who is not meeting estimated needs.   Nutrition Dx:   Inadequate oral intake, ongoing.  Monitor:   1.  Food/Beverage; pt meeting >/=90% estimated needs with tolerance.  Not met, ongoing. 2.  Wt/wt change; monitor trends. Ongoing.   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  GOC completed (8/16).  Pt has transition to DNR, however scope of treatment remains aggressive. Pt in procedure at time of visit. PO intake 0-30% of meals.   Height: Ht Readings from Last 1 Encounters:  10/04/12 6\' 2"  (1.88 m)    Weight Status:   Wt Readings from Last 1 Encounters:  10/09/12 193 lb 2 oz (87.6 kg)    Re-estimated needs:  Kcal: 1900-2100 Protein: 100-110g Fluid: 1.9-2.1 L/day  Skin: wound to R foot  Diet Order: General   Intake/Output Summary (Last 24 hours) at 10/09/12 1150 Last data filed at 10/09/12 1100  Gross per 24 hour  Intake    460 ml  Output   1260 ml  Net   -800 ml    Last BM: 8/16   Labs:   Recent Labs Lab 10/07/12 0442 10/08/12 0640 10/09/12 0648 10/09/12 0947  NA 138 141 140 139  K 4.0 3.8 4.0 4.0  CL 99 100 98 97  CO2 30 34* 33* 31  BUN 41* 43* 50* 50*  CREATININE 1.16 1.25 1.60* 1.61*  CALCIUM 9.3 9.2 9.2 9.3  MG 1.5 1.4* 1.6  --   GLUCOSE 201* 102* 145* 153*    CBG (last 3)   Recent Labs  10/08/12 2331 10/09/12 0308 10/09/12 0820  GLUCAP 188* 165* 130*    Scheduled Meds: . ALPRAZolam  0.25 mg Oral BID  . arformoterol  15 mcg Nebulization BID  . aspirin  EC  81 mg Oral Daily  . atorvastatin  40 mg Oral q1800  . calcium carbonate  2 tablet Oral Daily  . carbidopa-levodopa  1 tablet Oral Custom  . carbidopa-levodopa  1 tablet Oral QAC breakfast  . clopidogrel  75 mg Oral Q breakfast  . furosemide  120 mg Oral BID  . HYDROmorphone  2 mg Oral Q12H  . insulin aspart  0-15 Units Subcutaneous Q4H  . insulin glargine  10 Units Subcutaneous Daily  . ipratropium  0.5 mg Nebulization BID  . isosorbide mononitrate  60 mg Oral Daily  . lamoTRIgine  200 mg Oral Daily  . LORazepam  1 mg Oral Once  . magnesium gluconate  500 mg Oral BID  . methylPREDNISolone (SOLU-MEDROL) injection  4 mg Intravenous Daily  . mycophenolate  500 mg Oral BID  . nystatin-triamcinolone ointment   Topical QID  . pantoprazole  40 mg Oral Q1200  . senna-docusate  1 tablet Oral QHS  . sodium chloride  3 mL Intravenous Q12H  . sulfamethoxazole-trimethoprim  1 tablet Oral Q12H  . tacrolimus  5 mg Oral BID  . tamsulosin  0.8 mg Oral Daily  . Vilazodone HCl  20 mg Oral Daily  . vitamin C  1,000 mg Oral Daily  Continuous Infusions:   Brynda Greathouse, MS RD LDN Clinical Inpatient Dietitian Pager: [phone] Weekend/After hours pager: [phone]

## 2012-10-09 NOTE — Clinical Social Work Note (Signed)
Clinical Social Worker attempted to reach family regarding PT/OT's recommendation for SNF placement. No family was at bedside. CSW left a message for wife to return call. CSW will await call from wife.   Rozetta Nunnery MSW, [employer] [phone]

## 2012-10-09 NOTE — Progress Notes (Signed)
Patient ID: Alvera Novel., male   DOB: Sep 14, 1940, 73 y.o.   MRN: [id number] S:no new complaints O:BP 141/70  Pulse 108  Temp(Src) 97.6 F (36.4 C) (Oral)  Resp 25  Ht 6\' 2"  (1.88 m)  Wt 87.6 kg (193 lb 2 oz)  BMI 24.78 kg/m2  SpO2 95%  Intake/Output Summary (Last 24 hours) at 10/09/12 0942 Last data filed at 10/09/12 0600  Gross per 24 hour  Intake    460 ml  Output   1160 ml  Net   -700 ml   Intake/Output: I/O last 3 completed shifts: In: 882 [P.O.:820; IV Piggyback:62] Out: 2650 [Urine:2650]  Intake/Output this shift:    Weight change: -1.7 kg (-3 lb 12 oz) Gen:WD elderly WM in NAD CVS:no rub Resp:decreased BS Abd:BS Ext:tr edema   Recent Labs Lab 10/02/12 1702 10/04/12 1146 10/04/12 2036 10/05/12 0500 10/06/12 0434 10/07/12 0442 10/08/12 0640 10/09/12 0648  NA 136 136  --  138 138 138 141 140  K 5.9* 5.1  --  5.0 4.2 4.0 3.8 4.0  CL 107 104  --  106 102 99 100 98  CO2 22 21  --  25 25 30  34* 33*  GLUCOSE 181* 92  --  168* 131* 201* 102* 145*  BUN 42* 44*  --  44* 43* 41* 43* 50*  CREATININE 1.5 1.39* 1.44* 1.38* 1.28 1.16 1.25 1.60*  ALBUMIN  --   --   --  2.5* 2.6* 2.4* 2.3* 2.5*  CALCIUM 8.9 9.4  --  9.2 9.1 9.3 9.2 9.2  AST  --   --   --  14 12 10 11 11   ALT  --   --   --  5 6 5  <5 <5   Liver Function Tests:  Recent Labs Lab 10/07/12 0442 10/08/12 0640 10/09/12 0648  AST 10 11 11   ALT 5 <5 <5  ALKPHOS 168* 156* 160*  BILITOT 1.8* 1.4* 1.0  PROT 6.8 6.1 6.2  ALBUMIN 2.4* 2.3* 2.5*   No results found for this basename: LIPASE, AMYLASE,  in the last 168 hours No results found for this basename: AMMONIA,  in the last 168 hours CBC:  Recent Labs Lab 10/02/12 1702 10/04/12 1146 10/04/12 2036 10/08/12 0812  WBC 7.7 8.4 6.6 6.4  NEUTROABS 6.9  --   --  4.6  HGB 10.2* 10.7* 10.3* 10.0*  HCT 32.0* 33.1* 33.0* 32.0*  MCV 79.7 79.6 80.7 81.2  PLT 141.0* 126* 132* 111*   Cardiac Enzymes:  Recent Labs Lab 10/05/12 0050  10/05/12 0500 10/05/12 1435  TROPONINI <0.30 <0.30 <0.30   CBG:  Recent Labs Lab 10/08/12 1719 10/08/12 2003 10/08/12 2331 10/09/12 0308 10/09/12 0820  GLUCAP 208* 170* 188* 165* 130*    Iron Studies: No results found for this basename: IRON, TIBC, TRANSFERRIN, FERRITIN,  in the last 72 hours Studies/Results: Ct Head Wo Contrast  10/08/2012   *RADIOLOGY REPORT*  Clinical Data: Altered mental status.  CT HEAD WITHOUT CONTRAST  Technique:  Contiguous axial images were obtained from the base of the skull through the vertex without contrast.  Comparison: Head CT scan 03/11/2009.  Findings: There is some cortical atrophy and chronic microvascular ischemic change.  No evidence of acute abnormality including infarct, hemorrhage, mass lesion, mass effect, midline shift or abnormal extra-axial fluid collection is identified.  The calvarium is intact.  No hydrocephalus or pneumocephalus.  IMPRESSION: No acute finding.  Stable compared to prior exam.   Original Report Authenticated  By: Holley Dexter, M.D.   Dg Chest Port 1 View  10/07/2012   *RADIOLOGY REPORT*  Clinical Data: Follow up pulmonary edema  PORTABLE CHEST - 1 VIEW  Comparison: 10/06/2012  Findings: Previous median sternotomy CABG procedure.  There is moderate cardiac enlargement noted. Bilateral pleural effusions and pulmonary edema is unchanged compared with previous exam.  IMPRESSION:  1.  No change in CHF.   Original Report Authenticated By: Signa Kell, M.D.   . ALPRAZolam  0.25 mg Oral BID  . arformoterol  15 mcg Nebulization BID  . aspirin EC  81 mg Oral Daily  . atorvastatin  40 mg Oral q1800  . calcium carbonate  2 tablet Oral Daily  . carbidopa-levodopa  1 tablet Oral Custom  . carbidopa-levodopa  1 tablet Oral QAC breakfast  . clopidogrel  75 mg Oral Q breakfast  . furosemide  120 mg Oral BID  . HYDROmorphone  2 mg Oral Q12H  . insulin aspart  0-15 Units Subcutaneous Q4H  . insulin glargine  10 Units Subcutaneous  Daily  . ipratropium  0.5 mg Nebulization BID  . isosorbide mononitrate  60 mg Oral Daily  . lamoTRIgine  200 mg Oral Daily  . LORazepam  1 mg Oral Once  . magnesium gluconate  500 mg Oral BID  . methylPREDNISolone (SOLU-MEDROL) injection  4 mg Intravenous Daily  . mycophenolate  500 mg Oral BID  . nystatin-triamcinolone ointment   Topical QID  . pantoprazole  40 mg Oral Q1200  . senna-docusate  1 tablet Oral QHS  . sodium chloride  3 mL Intravenous Q12H  . sulfamethoxazole-trimethoprim  1 tablet Oral Q12H  . tacrolimus  5 mg Oral BID  . tamsulosin  0.8 mg Oral Daily  . Vilazodone HCl  20 mg Oral Daily  . vitamin C  1,000 mg Oral Daily    BMET    Component Value Date/Time   NA 140 10/09/2012 0648   K 4.0 10/09/2012 0648   CL 98 10/09/2012 0648   CO2 33* 10/09/2012 0648   GLUCOSE 145* 10/09/2012 0648   BUN 50* 10/09/2012 0648   CREATININE 1.60* 10/09/2012 0648   CALCIUM 9.2 10/09/2012 0648   GFRNONAA 41* 10/09/2012 0648   GFRAA 48* 10/09/2012 0648   CBC    Component Value Date/Time   WBC 6.4 10/08/2012 0812   RBC 3.94* 10/08/2012 0812   RBC 3.70* 03/24/2009 0357   HGB 10.0* 10/08/2012 0812   HCT 32.0* 10/08/2012 0812   PLT 111* 10/08/2012 0812   MCV 81.2 10/08/2012 0812   MCH 25.4* 10/08/2012 0812   MCHC 31.3 10/08/2012 0812   RDW 17.3* 10/08/2012 0812   LYMPHSABS 1.1 10/08/2012 0812   MONOABS 0.6 10/08/2012 0812   EOSABS 0.2 10/08/2012 0812   BASOSABS 0.0 10/08/2012 0812     Impression:  1. AKI/CKD 2. Acute on chronic resp failure- diuresing 3. COPD / home O2 4. Renal transplant 2002 baseline creat 1.0-1.6 5. Combined syst/diast heart failure, EF 40-45% 6. Recurrent loculated R pleural effusion 7. Parkinson's disease 8. Chronic debility 9. CAD w CABG 2011 10. 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 Severe debilitation 12. ACDz Plan:  1. Bump in Scr likely combo of bactrim and diuresis.  On lower dose of po lasix and would d/c bactrim after 3 days of therapy for UTI (try to d/c foley as  well but will need flomax 0.8qhs) 2. Cont with diuretic regimen to po lasix and cont to follow UOP/Scr 3. Agree with DNR  and weighing QOL issues 4. Watch K and Scr while on bactrim which interferes with Scr excretion/secretion and will need to dose renal with SS bid not DS bid. 5.   Kyah Buesing A

## 2012-10-09 NOTE — Clinical Social Work Placement (Signed)
Clinical Social Work Department CLINICAL SOCIAL WORK PLACEMENT NOTE 10/09/2012  Patient:  John [name], John [name]  Account Number:  [id number] Admit date:  10/04/2012  Clinical Social Worker:  Hulan Fray  Date/time:  10/09/2012 04:01 PM  Clinical Social Work is seeking post-discharge placement for this patient at the following level of care:   SKILLED NURSING   (*CSW will update this form in Epic as items are completed)   10/09/2012  Patient/family provided with [facility] Department of Clinical Social Work's list of facilities offering this level of care within the geographic area requested by the patient (or if unable, by the patient's family).  10/09/2012  Patient/family informed of their freedom to choose among providers that offer the needed level of care, that participate in Medicare, Medicaid or managed care program needed by the patient, have an available bed and are willing to accept the patient.  10/09/2012  Patient/family informed of [facility]' ownership interest in [facility], as well as of the fact that they are under no obligation to receive care at this facility.  PASARR submitted to EDS on 05/19/2011 PASARR number received from [employer] on 05/19/2011  FL2 transmitted to all facilities in geographic area requested by pt/family on  10/09/2012 FL2 transmitted to all facilities within larger geographic area on   Patient informed that his/her managed care company has contracts with or will negotiate with  certain facilities, including the following:     Patient/family informed of bed offers received:   Patient chooses bed at  Physician recommends and patient chooses bed at    Patient to be transferred to  on   Patient to be transferred to facility by   The following physician request were entered in Epic:   Additional Comments:

## 2012-10-09 NOTE — Progress Notes (Addendum)
Patient ID: John Novel., male   DOB: 1940-08-04, 72 y.o.   MRN: [id number]    SUBJECTIVE: Awake/alert today, confused some yesterday.  States that breathing is better overall.  Appears that metolazone was stopped yesterday and he was put on po Lasix.  Creatinine to 1.6 this morning.  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    . ALPRAZolam  0.25 mg Oral BID  . arformoterol  15 mcg Nebulization BID  . aspirin EC  81 mg Oral Daily  . atorvastatin  40 mg Oral q1800  . calcium carbonate  2 tablet Oral Daily  . carbidopa-levodopa  1 tablet Oral Custom  . carbidopa-levodopa  1 tablet Oral QAC breakfast  . clopidogrel  75 mg Oral Q breakfast  . furosemide  120 mg Intravenous BID  . HYDROmorphone  2 mg Oral Q12H  . insulin aspart  0-15 Units Subcutaneous Q4H  . insulin glargine  10 Units Subcutaneous Daily  . ipratropium  0.5 mg Nebulization BID  . isosorbide mononitrate  60 mg Oral Daily  . lamoTRIgine  200 mg Oral Daily  . LORazepam  1 mg Oral Once  . magnesium gluconate  500 mg Oral BID  . methylPREDNISolone (SOLU-MEDROL) injection  4 mg Intravenous Daily  . mycophenolate  500 mg Oral BID  . nystatin-triamcinolone ointment   Topical QID  . pantoprazole  40 mg Oral Q1200  . senna-docusate  1 tablet Oral QHS  . sodium chloride  3 mL Intravenous Q12H  . sulfamethoxazole-trimethoprim  1 tablet Oral Q12H  . tacrolimus  5 mg Oral BID  . tamsulosin  0.8 mg Oral Daily  . Vilazodone HCl  20 mg Oral Daily  . vitamin C  1,000 mg Oral Daily      Filed Vitals:   10/08/12 2113 10/08/12 2303 10/09/12 0314 10/09/12 0751  BP:  122/40 146/65   Pulse:      Temp:  98.2 F (36.8 C) 97.8 F (36.6 C)   TempSrc:  Oral Oral   Resp:  19 18   Height:      Weight:   87.6 kg (193 lb 2 oz)   SpO2: 99% 100% 98% 95%    Intake/Output Summary (Last 24 hours) at 10/09/12 0806 Last data filed at 10/09/12 0600  Gross per 24 hour  Intake    460 ml  Output   1160 ml  Net   -700 ml     LABS: Basic Metabolic Panel:  Recent Labs  40/98/11 0442 10/08/12 0640  NA 138 141  K 4.0 3.8  CL 99 100  CO2 30 34*  GLUCOSE 201* 102*  BUN 41* 43*  CREATININE 1.16 1.25  CALCIUM 9.3 9.2  MG 1.5 1.4*   Liver Function Tests:  Recent Labs  10/07/12 0442 10/08/12 0640  AST 10 11  ALT 5 <5  ALKPHOS 168* 156*  BILITOT 1.8* 1.4*  PROT 6.8 6.1  ALBUMIN 2.4* 2.3*   No results found for this basename: LIPASE, AMYLASE,  in the last 72 hours CBC:  Recent Labs  10/08/12 0812  WBC 6.4  NEUTROABS 4.6  HGB 10.0*  HCT 32.0*  MCV 81.2  PLT 111*   Cardiac Enzymes: No results found for this basename: CKTOTAL, CKMB, CKMBINDEX, TROPONINI,  in the last 72 hours BNP: No components found with this basename: POCBNP,  D-Dimer: No results found for this basename: DDIMER,  in the last 72 hours Hemoglobin A1C: No results found for this basename: HGBA1C,  in the last 72 hours Fasting Lipid Panel: No results found for this basename: CHOL, HDL, LDLCALC, TRIG, CHOLHDL, LDLDIRECT,  in the last 72 hours Thyroid Function Tests: No results found for this basename: TSH, T4TOTAL, FREET3, T3FREE, THYROIDAB,  in the last 72 hours Anemia Panel: No results found for this basename: VITAMINB12, FOLATE, FERRITIN, TIBC, IRON, RETICCTPCT,  in the last 72 hours  RADIOLOGY: Dg Chest Port 1 View  10/06/2012   *RADIOLOGY REPORT*  Clinical Data: Edema.  PORTABLE CHEST - 1 VIEW  Comparison: 10/05/2012  Findings: Patchy airspace densities, right side greater than left. Findings could represent asymmetric pulmonary edema and/or infection.  Heart size is grossly stable.  No evidence for pneumothorax.  IMPRESSION: Minimal change in the bilateral airspace disease, right side greater than left.   Original Report Authenticated By: Richarda Overlie, M.D.   Dg Chest Port 1 View  10/05/2012   *RADIOLOGY REPORT*  Clinical Data: Pulmonary edema.  Evaluate for respiratory status and lung status.  PORTABLE CHEST - 1 VIEW   Comparison: 10/04/2012.  Findings: Patchy bilateral airspace lung opacities are similar to the previous day's exam.  There is additional opacity at the right lung base consistent with atelectasis and a small effusion.  This is also stable.  There is no pneumothorax.  Changes from CABG surgery are stable.  The cardiac silhouette is normal in size.  IMPRESSION: Persistent bilateral airspace lung opacities with additional right lung base pleural fluid and atelectasis.  There is been no change from prior exam. The bilateral lung opacities are consistent with pulmonary edema.   Original Report Authenticated By: Amie Portland, M.D.   Dg Chest Port 1 View  10/04/2012   *RADIOLOGY REPORT*  Clinical Data: Shortness of breath.  PORTABLE CHEST - 1 VIEW  Comparison: 10/02/2012.  Findings: Diffuse slightly asymmetric air space disease similar to the recent examination.  Question pulmonary edema versus infectious infiltrate.  Cardiomegaly post CABG.  Calcified aorta.  IMPRESSION: Diffuse slightly asymmetric air space disease similar to the recent examination.  Question pulmonary edema versus infectious infiltrate. Recommend follow-up until clearance.  Cardiomegaly post CABG.  Calcified aorta.   Original Report Authenticated By: Lacy Duverney, M.D.   US Thoracentesis Asp Pleural Space W/img Guide  09/15/2012   *RADIOLOGY REPORT*  Clinical Data:  Patient with history of prior CABG, COPD, right pleural effusion, dyspnea.  Request is made for therapeutic right thoracentesis.  ULTRASOUND GUIDED  THERAPEUTIC RIGHT THORACENTESIS  An ultrasound guided thoracentesis was thoroughly discussed with the patient and questions answered.  The benefits, risks, alternatives and complications were also discussed.  The patient understands and wishes to proceed with the procedure.  Written consent was obtained.  Ultrasound was performed to localize and mark an adequate pocket of fluid in the right chest.  The area was then prepped and draped in  the normal sterile fashion.  1% Lidocaine was used for local anesthesia.  Under ultrasound guidance a 19 gauge Yueh catheter was introduced.  Thoracentesis was performed.  The catheter was removed and a dressing applied.  Complications:  none  Findings: A total of approximately 150 cc's of blood tinged serous fluid was removed. Due to the multiloculated nature of the collection and despite catheter manipulation, only the above amount of fluid could be aspirated at this time.  IMPRESSION: Successful ultrasound guided therapeutic right thoracentesis yielding 150 cc's of pleural fluid. Due to the multiloculated nature of the collection and small amount of fluid removed follow- up CT chest may be helpful  for further characterization.  Read by: Jeananne Rama, P.A.-C   Original Report Authenticated By: Irish Lack, M.D.    PHYSICAL EXAM General: NAD, sleepy Neck: JVP 14 cm, no thyromegaly or thyroid nodule.  Lungs: Decreased breath sounds at bases bilaterally.  CV: Nondisplaced PMI.  Heart regular S1/S2, no S3/S4, 3/6 SEM RUSB.  1+ ankle edema.  No carotid bruit.   Abdomen: Soft, nontender, no hepatosplenomegaly, no distention.  Neurologic: Alert and oriented x 3.  Psych: Normal affect. Extremities: No clubbing or cyanosis.   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 1. 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  He has diuresed well in the hospital but is still quite volume overloaded on exam.  However, creatinine is up to 1.6 this morning.  Agree with giving him a break from IV diuresis with bump in renal function (would not hold po Lasix however, increased cr may be related to Bactrim effect). He will need gentle diuresis over time.  2.  COPD: He is on home oxygen at baseline.  3. CKD: Creatinine up to 1.6, on po diuretic now. This could be partially related to Bactrim effect so may not be totally reflective of a GFR fall.  4. 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 His HR is fine currently.  Agree with stopping pyridostigmine as he was bradycardic at admission.  So far, not lightheaded with standing.   5. UTI: Enterobacter in urine, Cipro and Bactrim sensitive.  Will treat for complicated UTI given foley, will use Bactrim given potential interaction of Cipro with mycophenolate.  On SS Bactrim, will need to follow creatinine/K with this.  6. CAD: s/p CABG.  Stable, on ASA/Plavix/atorvastatin. 7. Disposition: Very debilitated, will need SNF.  DNR.  Noted palliative care involvement.   [name] 10/09/2012 8:06 AM

## 2012-10-10 ENCOUNTER — Inpatient Hospital Stay (HOSPITAL_COMMUNITY): Payer: Medicare Other

## 2012-10-10 DIAGNOSIS — I2789 Other specified pulmonary heart diseases: Secondary | ICD-10-CM

## 2012-10-10 DIAGNOSIS — Z515 Encounter for palliative care: Secondary | ICD-10-CM

## 2012-10-10 DIAGNOSIS — G20 Parkinson's disease: Secondary | ICD-10-CM

## 2012-10-10 DIAGNOSIS — I4891 Unspecified atrial fibrillation: Secondary | ICD-10-CM

## 2012-10-10 DIAGNOSIS — J449 Chronic obstructive pulmonary disease, unspecified: Secondary | ICD-10-CM

## 2012-10-10 DIAGNOSIS — M549 Dorsalgia, unspecified: Secondary | ICD-10-CM

## 2012-10-10 DIAGNOSIS — I5032 Chronic diastolic (congestive) heart failure: Secondary | ICD-10-CM

## 2012-10-10 LAB — BASIC METABOLIC PANEL
CO2: 33 mEq/L — ABNORMAL HIGH (ref 19–32)
Calcium: 9.3 mg/dL (ref 8.4–10.5)
Chloride: 98 mEq/L (ref 96–112)
Creatinine, Ser: 1.82 mg/dL — ABNORMAL HIGH (ref 0.50–1.35)
Glucose, Bld: 141 mg/dL — ABNORMAL HIGH (ref 70–99)

## 2012-10-10 LAB — GLUCOSE, CAPILLARY
Glucose-Capillary: 114 mg/dL — ABNORMAL HIGH (ref 70–99)
Glucose-Capillary: 152 mg/dL — ABNORMAL HIGH (ref 70–99)
Glucose-Capillary: 135 mg/dL — ABNORMAL HIGH (ref 70–99)

## 2012-10-10 MED ORDER — IPRATROPIUM BROMIDE 0.02 % IN SOLN
0.5000 mg | Freq: Four times a day (QID) | RESPIRATORY_TRACT | Status: DC
  Administered 2012-10-10 (×2): 0.5 mg via RESPIRATORY_TRACT
  Filled 2012-10-10: qty 2.5

## 2012-10-10 MED ORDER — IPRATROPIUM BROMIDE 0.02 % IN SOLN: 0.5000 mg | Freq: Four times a day (QID) | RESPIRATORY_TRACT | Status: DC

## 2012-10-10 MED ORDER — IPRATROPIUM BROMIDE 0.02 % IN SOLN
0.5000 mg | Freq: Two times a day (BID) | RESPIRATORY_TRACT | Status: DC
  Administered 2012-10-11 – 2012-10-14 (×7): 0.5 mg via RESPIRATORY_TRACT
  Filled 2012-10-10: qty 7.5
  Filled 2012-10-10 (×3): qty 2.5
  Filled 2012-10-10: qty 7.5

## 2012-10-10 MED ORDER — HALOPERIDOL LACTATE 5 MG/ML IJ SOLN
2.0000 mg | INTRAMUSCULAR | Status: DC | PRN
  Administered 2012-10-11 – 2012-10-13 (×4): 2 mg via INTRAVENOUS
  Filled 2012-10-10 (×4): qty 0.4

## 2012-10-10 MED ORDER — HYDROMORPHONE HCL 2 MG PO TABS
1.0000 mg | ORAL_TABLET | Freq: Four times a day (QID) | ORAL | Status: DC
  Administered 2012-10-11 (×2): 1 mg via ORAL
  Filled 2012-10-10 (×2): qty 1

## 2012-10-10 MED ORDER — HALOPERIDOL LACTATE 5 MG/ML IJ SOLN
2.0000 mg | Freq: Once | INTRAMUSCULAR | Status: AC
  Administered 2012-10-10: 2 mg via INTRAVENOUS
  Filled 2012-10-10: qty 0.4

## 2012-10-10 MED ORDER — LORAZEPAM 2 MG/ML IJ SOLN
1.0000 mg | Freq: Four times a day (QID) | INTRAMUSCULAR | Status: DC | PRN
  Administered 2012-10-10 – 2012-10-11 (×3): 1 mg via INTRAVENOUS
  Filled 2012-10-10 (×3): qty 1

## 2012-10-10 MED ORDER — HYDROMORPHONE HCL 1 MG/ML PO LIQD: 1.0000 mg | Freq: Four times a day (QID) | ORAL | Status: DC

## 2012-10-10 MED ORDER — METHYLPREDNISOLONE SODIUM SUCC 40 MG IJ SOLR
20.0000 mg | Freq: Every day | INTRAMUSCULAR | Status: DC
  Administered 2012-10-10 – 2012-10-14 (×5): 20 mg via INTRAVENOUS
  Filled 2012-10-10 (×5): qty 0.5

## 2012-10-10 MED ORDER — FUROSEMIDE 10 MG/ML IJ SOLN
120.0000 mg | Freq: Two times a day (BID) | INTRAVENOUS | Status: DC
  Administered 2012-10-10 – 2012-10-14 (×8): 120 mg via INTRAVENOUS
  Filled 2012-10-10 (×11): qty 12

## 2012-10-10 MED ORDER — HYDROMORPHONE HCL PF 1 MG/ML IJ SOLN
0.5000 mg | INTRAMUSCULAR | Status: DC | PRN
  Administered 2012-10-10 – 2012-10-11 (×2): 0.5 mg via INTRAVENOUS
  Filled 2012-10-10 (×2): qty 1

## 2012-10-10 MED ORDER — IPRATROPIUM BROMIDE 0.02 % IN SOLN: 0.5000 mg | Freq: Three times a day (TID) | RESPIRATORY_TRACT | Status: DC

## 2012-10-10 MED ORDER — FUROSEMIDE 10 MG/ML IJ SOLN
80.0000 mg | Freq: Once | INTRAMUSCULAR | Status: AC
  Administered 2012-10-10: 80 mg via INTRAVENOUS
  Filled 2012-10-10: qty 8

## 2012-10-10 MED ORDER — LORAZEPAM 2 MG/ML IJ SOLN
1.0000 mg | Freq: Once | INTRAMUSCULAR | Status: AC
  Administered 2012-10-10: 1 mg via INTRAVENOUS
  Filled 2012-10-10: qty 1

## 2012-10-10 MED ORDER — LORAZEPAM 2 MG/ML IJ SOLN
1.0000 mg | Freq: Two times a day (BID) | INTRAMUSCULAR | Status: DC
  Administered 2012-10-10 – 2012-10-11 (×3): 1 mg via INTRAVENOUS
  Filled 2012-10-10 (×2): qty 1

## 2012-10-10 NOTE — Progress Notes (Signed)
Patient is having difficulty breathing, MD aware and is currently in patient's room assessing patient and orders given for breathing treatment; respiratory therapist is currently giving treatment; will continue to monitor patient. D.Caeleigh Prohaska RN

## 2012-10-10 NOTE — Progress Notes (Addendum)
Palliative Medicine Team Progress Note  Events overnight and this AM reviewed. Mr. Dubeau has again acutely decompensated -he has worsening of his CXR -pulmonary edema or possible infiltrate. He is severely and irreversibly debilitated.  I discussed this at length with his wife who is exhausted and asking now that we make him completely comfortable. We also discussed that the patient himself may not be able to actually choose, endorse or fully process in a tangible way his own end of life care given his rapid decline. Mrs. Pucillo said she is intellectually and emotionally coming to terms with his current condition, prognosis and has a better vision for him as to what she needs to help him with-namely the possibility of residential hospice placement. She has questions and is requesting assistance with what that pathway may look like.   Residential Hospice would absolutely be an appropriate transition for Mr. Pavao, he now facing what appears to be a terminal delirium with severe agitation- his Parkinson's may make this challenging medically but we will begin getting him more calm and comfortable today. His wife is now consenting to comfort care and sedation if necessary to get him calm and peaceful.   1. DNR 2. Full comfort care to include aggressive IV diuresis 3. Control of his delirium and agitation. Will do a trial dose of Haldol- hopefully he will not have ridgidity or EP signs-will schedule Ativan as well. 4. Hydromorphone changed to liquid-he is having issues swallowing-he has IV prn back up pain and dyspnea medication. 5. Will start to trim down his med list. 6. Will d/c tele-less restrictive and better for his comfort.  We will continue to follow Mr. Dehart and assist with next steps in his care.  Anderson Malta, DO Palliative Medicine

## 2012-10-10 NOTE — Progress Notes (Signed)
Patient ID: John [name]., male   DOB: 07-04-1940, 72 y.o.   MRN: [id number]   SUBJECTIVE: Looks worse this morning, struggling to breathe, some confusion.  Creatinine up again. 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    . ALPRAZolam  0.25 mg Oral BID  . arformoterol  15 mcg Nebulization BID  . aspirin EC  81 mg Oral Daily  . atorvastatin  40 mg Oral q1800  . calcium carbonate  2 tablet Oral Daily  . carbidopa-levodopa  1 tablet Oral Custom  . carbidopa-levodopa  1 tablet Oral QAC breakfast  . clopidogrel  75 mg Oral Q breakfast  . furosemide  120 mg Intravenous BID  . furosemide  80 mg Intravenous Once  . HYDROmorphone  2 mg Oral Q12H  . insulin aspart  0-15 Units Subcutaneous TID WC  . insulin glargine  10 Units Subcutaneous Daily  . ipratropium  0.5 mg Nebulization BID  . isosorbide mononitrate  60 mg Oral Daily  . lamoTRIgine  200 mg Oral Daily  . LORazepam  1 mg Oral Once  . magnesium gluconate  500 mg Oral BID  . mycophenolate  500 mg Oral BID  . nystatin-triamcinolone ointment   Topical QID  . pantoprazole  40 mg Oral Q1200  . predniSONE  5 mg Oral Q breakfast  . senna-docusate  1 tablet Oral QHS  . tacrolimus  5 mg Oral BID  . tamsulosin  0.8 mg Oral Daily  . Vilazodone HCl  20 mg Oral Daily  . vitamin C  1,000 mg Oral Daily      Filed Vitals:   10/09/12 1731 10/09/12 2026 10/10/12 0646 10/10/12 0746  BP: 109/44 132/56 133/57   Pulse: 66 74 84 82  Temp: 98.2 F (36.8 C) 97.8 F (36.6 C) 97.6 F (36.4 C)   TempSrc: Oral Oral Axillary   Resp: 18 18 22    Height:      Weight: 87.5 kg (192 lb 14.4 oz)  86.6 kg (190 lb 14.7 oz)   SpO2: 96% 97% 91% 93%    Intake/Output Summary (Last 24 hours) at 10/10/12 0838 Last data filed at 10/10/12 0300  Gross per 24 hour  Intake      0 ml  Output    575 ml  Net   -575 ml    LABS: Basic Metabolic Panel:  Recent Labs  40/98/11 0640 10/09/12 0648 10/09/12 0947 10/10/12 0500  NA 141 140 139 140  K 3.8  4.0 4.0 4.0  CL 100 98 97 98  CO2 34* 33* 31 33*  GLUCOSE 102* 145* 153* 141*  BUN 43* 50* 50* 52*  CREATININE 1.25 1.60* 1.61* 1.82*  CALCIUM 9.2 9.2 9.3 9.3  MG 1.4* 1.6  --   --    Liver Function Tests:  Recent Labs  10/08/12 0640 10/09/12 0648  AST 11 11  ALT <5 <5  ALKPHOS 156* 160*  BILITOT 1.4* 1.0  PROT 6.1 6.2  ALBUMIN 2.3* 2.5*   No results found for this basename: LIPASE, AMYLASE,  in the last 72 hours CBC:  Recent Labs  10/08/12 0812  WBC 6.4  NEUTROABS 4.6  HGB 10.0*  HCT 32.0*  MCV 81.2  PLT 111*   Cardiac Enzymes: No results found for this basename: CKTOTAL, CKMB, CKMBINDEX, TROPONINI,  in the last 72 hours BNP: No components found with this basename: POCBNP,  D-Dimer: No results found for this basename: DDIMER,  in the last 72 hours Hemoglobin A1C:  No results found for this basename: HGBA1C,  in the last 72 hours Fasting Lipid Panel: No results found for this basename: CHOL, HDL, LDLCALC, TRIG, CHOLHDL, LDLDIRECT,  in the last 72 hours Thyroid Function Tests: No results found for this basename: TSH, T4TOTAL, FREET3, T3FREE, THYROIDAB,  in the last 72 hours Anemia Panel: No results found for this basename: VITAMINB12, FOLATE, FERRITIN, TIBC, IRON, RETICCTPCT,  in the last 72 hours  RADIOLOGY: Dg Chest Port 1 View  10/06/2012   *RADIOLOGY REPORT*  Clinical Data: Edema.  PORTABLE CHEST - 1 VIEW  Comparison: 10/05/2012  Findings: Patchy airspace densities, right side greater than left. Findings could represent asymmetric pulmonary edema and/or infection.  Heart size is grossly stable.  No evidence for pneumothorax.  IMPRESSION: Minimal change in the bilateral airspace disease, right side greater than left.   Original Report Authenticated By: Richarda Overlie, M.D.   Dg Chest Port 1 View  10/05/2012   *RADIOLOGY REPORT*  Clinical Data: Pulmonary edema.  Evaluate for respiratory status and lung status.  PORTABLE CHEST - 1 VIEW  Comparison: 10/04/2012.   Findings: Patchy bilateral airspace lung opacities are similar to the previous day's exam.  There is additional opacity at the right lung base consistent with atelectasis and a small effusion.  This is also stable.  There is no pneumothorax.  Changes from CABG surgery are stable.  The cardiac silhouette is normal in size.  IMPRESSION: Persistent bilateral airspace lung opacities with additional right lung base pleural fluid and atelectasis.  There is been no change from prior exam. The bilateral lung opacities are consistent with pulmonary edema.   Original Report Authenticated By: Amie Portland, M.D.   Dg Chest Port 1 View  10/04/2012   *RADIOLOGY REPORT*  Clinical Data: Shortness of breath.  PORTABLE CHEST - 1 VIEW  Comparison: 10/02/2012.  Findings: Diffuse slightly asymmetric air space disease similar to the recent examination.  Question pulmonary edema versus infectious infiltrate.  Cardiomegaly post CABG.  Calcified aorta.  IMPRESSION: Diffuse slightly asymmetric air space disease similar to the recent examination.  Question pulmonary edema versus infectious infiltrate. Recommend follow-up until clearance.  Cardiomegaly post CABG.  Calcified aorta.   Original Report Authenticated By: Lacy Duverney, M.D.   US Thoracentesis Asp Pleural Space W/img Guide  09/15/2012   *RADIOLOGY REPORT*  Clinical Data:  Patient with history of prior CABG, COPD, right pleural effusion, dyspnea.  Request is made for therapeutic right thoracentesis.  ULTRASOUND GUIDED  THERAPEUTIC RIGHT THORACENTESIS  An ultrasound guided thoracentesis was thoroughly discussed with the patient and questions answered.  The benefits, risks, alternatives and complications were also discussed.  The patient understands and wishes to proceed with the procedure.  Written consent was obtained.  Ultrasound was performed to localize and mark an adequate pocket of fluid in the right chest.  The area was then prepped and draped in the normal sterile  fashion.  1% Lidocaine was used for local anesthesia.  Under ultrasound guidance a 19 gauge Yueh catheter was introduced.  Thoracentesis was performed.  The catheter was removed and a dressing applied.  Complications:  none  Findings: A total of approximately 150 cc's of blood tinged serous fluid was removed. Due to the multiloculated nature of the collection and despite catheter manipulation, only the above amount of fluid could be aspirated at this time.  IMPRESSION: Successful ultrasound guided therapeutic right thoracentesis yielding 150 cc's of pleural fluid. Due to the multiloculated nature of the collection and small amount of fluid  removed follow- up CT chest may be helpful for further characterization.  Read by: Jeananne Rama, P.A.-C   Original Report Authenticated By: Irish Lack, M.D.    PHYSICAL EXAM General: NAD, sleepy Neck: JVP 14 cm, no thyromegaly or thyroid nodule.  Lungs: Decreased breath sounds at bases bilaterally.  CV: Nondisplaced PMI.  Heart regular S1/S2, no S3/S4, 3/6 SEM RUSB.  Trace ankle edema.  No carotid bruit.   Abdomen: Soft, nontender, no hepatosplenomegaly, no distention.  Neurologic: Alert and oriented x 3.  Psych: Normal affect. Extremities: No clubbing or cyanosis.   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 1. 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  Weight is down but he is still quite volume overloaded on exam.  Diuretics had been changed to po a couple of days ago.  Today, he is struggling to breathe.  - Restart IV Lasix 120 mg bid.  Unfortunately, cardiorenal syndrome is likely playing a role here with rise in creatinine.  Will need to follow renal function closely but at this point need  to try to clear lungs.  2. COPD: He is on home oxygen at baseline.  He will need nebulizer treatments now, will give albuterol and add atrovent.  3. CKD: Creatinine up to 1.8, on po diuretic now. This could be partially related to Bactrim effect so may not be totally reflective of a GFR fall.  As above, he is still quite volume overloaded and dyspneic so will restart IV diuretics and follow renal function closely.  Will stop Bactrim today.  Bladder scan per nephrology.  4. 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 His HR is fine currently.  Agree with stopping pyridostigmine as he was bradycardic at admission.  So far, not lightheaded with standing.   5. UTI: Enterobacter in urine, Cipro and Bactrim sensitive.  Stopping Bactrim today with rising creatinine.  6. CAD: s/p CABG.  Stable, on ASA/Plavix/atorvastatin. 7. Disposition: Very debilitated, will need SNF.  DNR.  Noted palliative care involvement.   [name] 10/10/2012 8:38 AM

## 2012-10-10 NOTE — Progress Notes (Signed)
[facility] Progress Note [name] TEAM 1 - Stepdown/ICU TEAM   John [name]. 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   The patient underwent R thoracentesis 09/15/12 for loculated pleural effusion. He also recently completed a course of Augmentin and a prednisone taper for dyspnea. Over 10 days he developed significant DOE, dry cough, LEE, and increase in abdominal girth. Decreased UOP was noted per wife. He was evaluated in the pulmonary office at which time Lasix was increased to 80mg  BID. LE dopplers were negative for DVT. He saw John [name] and K was discontinued due to level 5.7, prednisone was decreased from 10->5mg , and Lasix was increased to 120mg  BID. He was still significantly SOB so his wife drove him to the ER. He was hypoxic at 87% on [place] thus was placed on bipap. He had been intermittently bradycardic down to the mid 30's in [place]. At times when dropping slower he has a 2:1 pattern but per discussion with John [name] with EP, this is likely [place] as well given surrounding rhythm. pBNP 6763, troponin neg x 1, BUN/Cr 44/1.39, K 5.1, Hgb 10.7/plt 126. CXR shows diffuse slightly asymmetric air space disease similar to the recent examination, question pulmonary edema versus infectious infiltrate, cardiomegaly post CABG, calcified aorta. He denied any fever, chills, orthopnea, CP, palpitations, or  syncope. He received 80mg  IV Lasix and atrovent/proventil neb in the ER. Patient seen by Cardiology in the ED.   Patient initially was admitted to 4 E, but rapid response team was called when patient went into respiratory distress and patient was transferred to 2900 to initiate BIPAP tx.  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 has been in this periodically to continuously for a couple of years - stopped pyridostigmine as he was bradycardic at admission - Cardiology following   COPD Requires home O2 chronically - see discussion per John [name] - Palliative Care consulted- pt appeared to be not yet ready for hospice but his wife is considering it- see note by John [name] today  DM type 2 insulin pump was disconnected and taken home - cont to follow CBG w/o change today   Parkinson's disease Cont home medical regimen  CAD w CABG 2011  Pulmonary hypertension  Recurrent loculated R pleural effusion  Stable   HYPERLIPIDEMIA  Code Status: NO CODE BLUE Family Communication: none Disposition Plan: - plan to be determined based on whether he will be made comfort care  Consultants:  Cardiology Nephrology Pulmonology   Procedures: Echocardiogram 10/05/2012 - Comparison to prior study March 2014. There is mild LVH with LVEF 55%, mld inferior hypokinesis, indeterminate diastolic function. MIld biatrial enlargement. Overall moderate aortic  stenosis, functionally bicuspid valve - similar gradients. RV enlargement and dysfunction with PASP now increased to 64 mmHg, CVP elevated.  Antibiotics: Bactrim 8/18 >>   DVT prophylaxis: SCDs  HPI/Subjective: Pt is sleepy- having difficult talking to me- no family in room at present  Objective: Blood pressure 130/54, pulse 80, temperature 97.6 F (36.4 C), temperature source Oral, resp. rate 18, height 6\' 2"  (1.88 m), weight 86.6 kg (190 lb 14.7 oz), SpO2 97.00%.  Intake/Output Summary (Last 24 hours) at 10/10/12 1719 Last data filed at 10/10/12 1341  Gross per 24 hour  Intake    480 ml  Output    900 ml  Net   -420 ml   Exam: General: sleepy, No acute respiratory distress at rest  Lungs: distant bs th/o all fields - no active wheeze  Cardiovascular: Regular rate without gallop or rub normal S1 and S2 Abdomen: Nontender, nondistended, soft, bowel sounds positive, no rebound, no ascites, no appreciable mass Extremities: No significant cyanosis, clubbing, or edema bilateral lower extremities  Data Reviewed: Basic Metabolic Panel:  Recent Labs Lab 10/05/12 0500 10/06/12 0434 10/07/12 0442 10/08/12 0640 10/09/12 0648 10/09/12 0947 10/10/12 0500  NA 138 138 138 141 140 139 140  K 5.0 4.2 4.0 3.8 4.0 4.0 4.0  CL 106 102 99 100 98 97 98  CO2 25 25 30  34* 33* 31 33*  GLUCOSE 168* 131* 201* 102* 145* 153* 141*  BUN 44* 43* 41* 43* 50* 50* 52*  CREATININE 1.38* 1.28 1.16 1.25 1.60* 1.61* 1.82*  CALCIUM 9.2 9.1 9.3 9.2 9.2 9.3 9.3  MG 1.9 1.7 1.5 1.4* 1.6  --   --    Liver Function Tests:  Recent Labs Lab 10/05/12 0500 10/06/12 0434 10/07/12 0442 10/08/12 0640 10/09/12 0648  AST 14 12 10 11 11   ALT 5 6 5  <5 <5  ALKPHOS 168* 172* 168* 156* 160*  BILITOT 1.2 1.6* 1.8* 1.4* 1.0  PROT 6.3 6.8 6.8 6.1 6.2  ALBUMIN 2.5* 2.6* 2.4* 2.3* 2.5*   CBC:  Recent Labs Lab 10/04/12 1146 10/04/12 2036 10/08/12 0812  WBC 8.4 6.6 6.4  NEUTROABS  --   --  4.6  HGB  10.7* 10.3* 10.0*  HCT 33.1* 33.0* 32.0*  MCV 79.6 80.7 81.2  PLT 126* 132* 111*   Cardiac Enzymes:  Recent Labs Lab 10/05/12 0050 10/05/12 0500 10/05/12 1435  TROPONINI <0.30 <0.30 <0.30   BNP (last 3 results)  Recent Labs  05/20/12 0926 10/02/12 1702 10/04/12 1146  PROBNP 513.0* 662.0* 6763.0*   CBG:  Recent Labs Lab 10/09/12 1804 10/09/12 2113 10/10/12 0605 10/10/12 1055 10/10/12 1601  GLUCAP 150* 130* 114* 152* 135*    Recent Results (from the past 240 hour(s))  URINE CULTURE     Status: None   Collection Time    10/04/12  2:36 PM      Result Value Range Status   Specimen Description URINE, CATHETERIZED   Final   Special Requests NONE   Final   Culture  Setup Time     Final   Value: 10/04/2012 16:21     Performed at [facility] Count     Final   Value: >=100,000 COLONIES/ML     Performed at [facility]  Final   Value: ENTEROBACTER CLOACAE     Performed at [facility]   Report Status 10/07/2012 FINAL   Final   Organism ID, Bacteria ENTEROBACTER CLOACAE   Final     Studies:  Recent x-ray studies have been reviewed in detail by the Attending Physician  Scheduled Meds:  Scheduled Meds: . arformoterol  15 mcg Nebulization BID  . aspirin EC  81 mg Oral Daily  . atorvastatin  40 mg Oral q1800  . calcium carbonate  2 tablet Oral Daily  . clopidogrel  75 mg Oral Q breakfast  . furosemide  120 mg Intravenous BID  . HYDROmorphone HCl  1 mg Oral Q6H  . insulin aspart  0-15 Units Subcutaneous TID WC  . insulin glargine  10 Units Subcutaneous Daily  . ipratropium  0.5 mg Nebulization Q6H  . isosorbide mononitrate  60 mg Oral Daily  . lamoTRIgine  200 mg Oral Daily  . LORazepam  1 mg Intravenous Q12H  . LORazepam  1 mg Oral Once  . magnesium gluconate  500 mg Oral BID  . methylPREDNISolone (SOLU-MEDROL) injection  20 mg Intravenous Daily  . mycophenolate  500 mg Oral BID  . nystatin-triamcinolone  ointment   Topical QID  . pantoprazole  40 mg Oral Q1200  . senna-docusate  1 tablet Oral QHS  . tacrolimus  5 mg Oral BID  . tamsulosin  0.8 mg Oral Daily  . Vilazodone HCl  20 mg Oral Daily  . vitamin C  1,000 mg Oral Daily   Time spent on care of this patient:   Calvert Cantor, MD  [facility] Office  [phone] Pager - Text Page per Loretha Stapler as per below:  On-Call/Text Page:      [email or web address]      password [email or web address]  If 7PM-7AM, please contact night-coverage [email or web address] Password [facility] 10/10/2012, 5:19 PM   LOS: 6 days

## 2012-10-10 NOTE — Plan of Care (Signed)
Problem: Phase I Progression Outcomes Goal: EF % per last Echo/documented,Core Reminder form on chart Outcome: Completed/Met Date Met:  10/10/12 10/05/12 EF 55-60%

## 2012-10-10 NOTE — Progress Notes (Signed)
Patient has received breathing treatment and 80mg  IV of Lasix has been administered per MD; will continue to monitor patient.  Lorretta Harp RN

## 2012-10-10 NOTE — Progress Notes (Signed)
Pt noted having increased respirations, anxiety, and agitation. Pt still has not voided and bladder scan noted . Pt continues to yell out wanting to get out of bed. MD on call notified new order given to insert foley and give IV Ativan. Given and noted immediate urine return. Respirations began to decline. Ativan given and noted decreased agitation.

## 2012-10-10 NOTE — Progress Notes (Signed)
Cosigned for Teal Curry's assessment, I/O, med administration, care plan/ education etc. D. Everlean Bucher RN 

## 2012-10-10 NOTE — Progress Notes (Signed)
Patient ID: Alvera Novel., male   DOB: 26-Nov-1940, 72 y.o.   MRN: [id number] S:pt more lethargic and having some dyspnea/distress O:BP 133/57  Pulse 82  Temp(Src) 97.6 F (36.4 C) (Axillary)  Resp 22  Ht 6\' 2"  (1.88 m)  Wt 86.6 kg (190 lb 14.7 oz)  BMI 24.5 kg/m2  SpO2 93%  Intake/Output Summary (Last 24 hours) at 10/10/12 0816 Last data filed at 10/10/12 0300  Gross per 24 hour  Intake      0 ml  Output    575 ml  Net   -575 ml   Intake/Output: I/O last 3 completed shifts: In: 100 [P.O.:100] Out: 935 [Urine:935]  Intake/Output this shift:    Weight change: -0.1 kg (-3.5 oz) YNW:[id number] ill-appearing WM CVS:no rub Resp:bibasilar crackles poor air movement HQI:[id number] Ext:+3 edema   Recent Labs Lab 10/05/12 0500 10/06/12 0434 10/07/12 0442 10/08/12 0640 10/09/12 0648 10/09/12 0947 10/10/12 0500  NA 138 138 138 141 140 139 140  K 5.0 4.2 4.0 3.8 4.0 4.0 4.0  CL 106 102 99 100 98 97 98  CO2 25 25 30  34* 33* 31 33*  GLUCOSE 168* 131* 201* 102* 145* 153* 141*  BUN 44* 43* 41* 43* 50* 50* 52*  CREATININE 1.38* 1.28 1.16 1.25 1.60* 1.61* 1.82*  ALBUMIN 2.5* 2.6* 2.4* 2.3* 2.5*  --   --   CALCIUM 9.2 9.1 9.3 9.2 9.2 9.3 9.3  AST 14 12 10 11 11   --   --   ALT 5 6 5  <5 <5  --   --    Liver Function Tests:  Recent Labs Lab 10/07/12 0442 10/08/12 0640 10/09/12 0648  AST 10 11 11   ALT 5 <5 <5  ALKPHOS 168* 156* 160*  BILITOT 1.8* 1.4* 1.0  PROT 6.8 6.1 6.2  ALBUMIN 2.4* 2.3* 2.5*   No results found for this basename: LIPASE, AMYLASE,  in the last 168 hours No results found for this basename: AMMONIA,  in the last 168 hours CBC:  Recent Labs Lab 10/04/12 1146 10/04/12 2036 10/08/12 0812  WBC 8.4 6.6 6.4  NEUTROABS  --   --  4.6  HGB 10.7* 10.3* 10.0*  HCT 33.1* 33.0* 32.0*  MCV 79.6 80.7 81.2  PLT 126* 132* 111*   Cardiac Enzymes:  Recent Labs Lab 10/05/12 0050 10/05/12 0500 10/05/12 1435  TROPONINI <0.30 <0.30 <0.30    CBG:  Recent Labs Lab 10/09/12 0820 10/09/12 1245 10/09/12 1804 10/09/12 2113 10/10/12 0605  GLUCAP 130* 209* 150* 130* 114*    Iron Studies: No results found for this basename: IRON, TIBC, TRANSFERRIN, FERRITIN,  in the last 72 hours Studies/Results: Ct Head Wo Contrast  10/08/2012   *RADIOLOGY REPORT*  Clinical Data: Altered mental status.  CT HEAD WITHOUT CONTRAST  Technique:  Contiguous axial images were obtained from the base of the skull through the vertex without contrast.  Comparison: Head CT scan 03/11/2009.  Findings: There is some cortical atrophy and chronic microvascular ischemic change.  No evidence of acute abnormality including infarct, hemorrhage, mass lesion, mass effect, midline shift or abnormal extra-axial fluid collection is identified.  The calvarium is intact.  No hydrocephalus or pneumocephalus.  IMPRESSION: No acute finding.  Stable compared to prior exam.   Original Report Authenticated By: Holley Dexter, M.D.   . ALPRAZolam  0.25 mg Oral BID  . arformoterol  15 mcg Nebulization BID  . aspirin EC  81 mg Oral Daily  . atorvastatin  40 mg Oral  q1800  . calcium carbonate  2 tablet Oral Daily  . carbidopa-levodopa  1 tablet Oral Custom  . carbidopa-levodopa  1 tablet Oral QAC breakfast  . clopidogrel  75 mg Oral Q breakfast  . furosemide  120 mg Oral BID  . HYDROmorphone  2 mg Oral Q12H  . insulin aspart  0-15 Units Subcutaneous TID WC  . insulin glargine  10 Units Subcutaneous Daily  . ipratropium  0.5 mg Nebulization BID  . isosorbide mononitrate  60 mg Oral Daily  . lamoTRIgine  200 mg Oral Daily  . LORazepam  1 mg Oral Once  . magnesium gluconate  500 mg Oral BID  . mycophenolate  500 mg Oral BID  . nystatin-triamcinolone ointment   Topical QID  . pantoprazole  40 mg Oral Q1200  . predniSONE  5 mg Oral Q breakfast  . senna-docusate  1 tablet Oral QHS  . sulfamethoxazole-trimethoprim  1 tablet Oral Q12H  . tacrolimus  5 mg Oral BID  .  tamsulosin  0.8 mg Oral Daily  . Vilazodone HCl  20 mg Oral Daily  . vitamin C  1,000 mg Oral Daily    BMET    Component Value Date/Time   NA 140 10/10/2012 0500   K 4.0 10/10/2012 0500   CL 98 10/10/2012 0500   CO2 33* 10/10/2012 0500   GLUCOSE 141* 10/10/2012 0500   BUN 52* 10/10/2012 0500   CREATININE 1.82* 10/10/2012 0500   CALCIUM 9.3 10/10/2012 0500   GFRNONAA 35* 10/10/2012 0500   GFRAA 41* 10/10/2012 0500   CBC    Component Value Date/Time   WBC 6.4 10/08/2012 0812   RBC 3.94* 10/08/2012 0812   RBC 3.70* 03/24/2009 0357   HGB 10.0* 10/08/2012 0812   HCT 32.0* 10/08/2012 0812   PLT 111* 10/08/2012 0812   MCV 81.2 10/08/2012 0812   MCH 25.4* 10/08/2012 0812   MCHC 31.3 10/08/2012 0812   RDW 17.3* 10/08/2012 0812   LYMPHSABS 1.1 10/08/2012 0812   MONOABS 0.6 10/08/2012 0812   EOSABS 0.2 10/08/2012 0812   BASOSABS 0.0 10/08/2012 0812     Impression:  1. AKI/CKD 2. Acute on chronic resp failure- diuresing 3. COPD / home O2 4. Renal transplant 2002 baseline creat 1.0-1.6 5. Combined syst/diast heart failure, EF 40-45% 6. Recurrent loculated R pleural effusion 7. Parkinson's disease 8. Chronic debility 9. CAD w CABG 2011 10. 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 Severe debilitation 12. ACDz Plan:  1. Bump in Scr likely combo of bactrim and diuresis. On lower dose of po lasix and would d/c bactrim after 3 days of therapy for UTI (try to d/c foley as well but will need flomax 0.8qhs).  Will stop bactrim and follow.  May add zosyn 2.25gm IV q8 per primary svc if they feel longer therapy needed for uti but foley taken out yesterday 2. Decreased UOP but drop in weight.  Bladder scan to r/o BOO and replace foley if PVR>200 3. Agree with IV Lasix again due to increased work of breathing 4. Would not recommend any form of RRT given his poor overall QOL, functional status and prognosis. 5. Agree with DNR and weighing QOL issues for possible hospice 6. . 7.   Theodore Rahrig A

## 2012-10-11 DIAGNOSIS — G20 Parkinson's disease: Secondary | ICD-10-CM

## 2012-10-11 DIAGNOSIS — J9600 Acute respiratory failure, unspecified whether with hypoxia or hypercapnia: Secondary | ICD-10-CM

## 2012-10-11 DIAGNOSIS — R0609 Other forms of dyspnea: Secondary | ICD-10-CM

## 2012-10-11 DIAGNOSIS — I5032 Chronic diastolic (congestive) heart failure: Secondary | ICD-10-CM

## 2012-10-11 DIAGNOSIS — E119 Type 2 diabetes mellitus without complications: Secondary | ICD-10-CM

## 2012-10-11 DIAGNOSIS — J449 Chronic obstructive pulmonary disease, unspecified: Secondary | ICD-10-CM

## 2012-10-11 DIAGNOSIS — I25810 Atherosclerosis of coronary artery bypass graft(s) without angina pectoris: Secondary | ICD-10-CM

## 2012-10-11 LAB — BASIC METABOLIC PANEL
BUN: 52 mg/dL — ABNORMAL HIGH (ref 6–23)
Chloride: 95 mEq/L — ABNORMAL LOW (ref 96–112)
Creatinine, Ser: 1.73 mg/dL — ABNORMAL HIGH (ref 0.50–1.35)
GFR calc Af Amer: 44 mL/min — ABNORMAL LOW (ref >90)
GFR calc non Af Amer: 38 mL/min — ABNORMAL LOW (ref >90)

## 2012-10-11 LAB — GLUCOSE, CAPILLARY: Glucose-Capillary: 165 mg/dL — ABNORMAL HIGH (ref 70–99)

## 2012-10-11 MED ORDER — DIPHENHYDRAMINE HCL 50 MG/ML IJ SOLN
12.5000 mg | Freq: Four times a day (QID) | INTRAMUSCULAR | Status: DC | PRN
  Administered 2012-10-11 – 2012-10-12 (×2): 12.5 mg via INTRAVENOUS
  Filled 2012-10-11 (×3): qty 1

## 2012-10-11 MED ORDER — FENTANYL BOLUS VIA INFUSION
20.0000 ug | INTRAVENOUS | Status: DC | PRN
  Filled 2012-10-11: qty 20

## 2012-10-11 MED ORDER — DIAZEPAM 5 MG/ML IJ SOLN
5.0000 mg | INTRAMUSCULAR | Status: DC | PRN
  Administered 2012-10-12 – 2012-10-13 (×2): 5 mg via INTRAVENOUS
  Filled 2012-10-11 (×4): qty 2

## 2012-10-11 MED ORDER — SODIUM CHLORIDE 0.9 % IV SOLN
20.0000 ug/h | INTRAVENOUS | Status: DC
  Administered 2012-10-11: 10 ug/h via INTRAVENOUS
  Administered 2012-10-13: 20 ug/h via INTRAVENOUS
  Filled 2012-10-11: qty 50

## 2012-10-11 MED ORDER — DIAZEPAM 5 MG/ML IJ SOLN
2.5000 mg | Freq: Four times a day (QID) | INTRAMUSCULAR | Status: DC
  Administered 2012-10-13 – 2012-10-14 (×8): 2.5 mg via INTRAVENOUS
  Filled 2012-10-11 (×6): qty 2

## 2012-10-11 NOTE — Progress Notes (Signed)
During shift change and assessment patient was sedated and resting comfortably. No signs of distress.  Patient's daughters bedside.  Will continue to monitor and round frequently.  John [name]

## 2012-10-11 NOTE — Progress Notes (Addendum)
Physical Therapy Discharge Patient Details Name: John [name]. MRN: [id number] DOB: 1940/06/29 Today's Date: 10/11/2012 Time:  -     Patient discharged from PT services secondary to medical decline.  Pt now full comfort care.  Spoke with pt's wife who requests PT to signoff.  MD will need re-order PT to resume therapy services.  Please see latest therapy progress note for current level of functioning and progress toward goals.    Progress and discharge plan discussed with patient and/or caregiver: Patient/Caregiver agrees with plan  GP      Verdell Face, PTA [phone] 10/11/2012

## 2012-10-11 NOTE — Progress Notes (Signed)
Received verbal order from Anderson Malta, DO to d/c CBG checks and to hold tonight's vital sign d/t patient resting comfortably. Will continue to monitor. [name]

## 2012-10-11 NOTE — Progress Notes (Signed)
CSW met with patient's wife today to discuss residential hospice choice.  List of residential hospices given to wife- she is requesting [facility] if possible and states that she has been in touch with the facility and Forrestine Him, LCSW - [facility].  CSW let v-message for Forrestine Him regarding above and generate [facility] referral.  CSW provided support to Mrs. John [name]. She stated that she is feeling better now that she has chosen to seek Hospice placement.  CSW will monitor and assist patient and family as needed. Patient was sleeping soundly and did not respond during today's visit.  Lorri Frederick. [place]  [phone]

## 2012-10-11 NOTE — Progress Notes (Signed)
Patient currently comfortably, no visible signs of distress. Will continue to monitor. [name]

## 2012-10-11 NOTE — Progress Notes (Signed)
Patient ID: John Novel., male   DOB: Nov 02, 1940, 72 y.o.   MRN: [id number] S:lethargic, no complaints O:BP 141/59  Pulse 70  Temp(Src) 98.9 F (37.2 C) (Oral)  Resp 18  Ht 6\' 2"  (1.88 m)  Wt 85.004 kg (187 lb 6.4 oz)  BMI 24.05 kg/m2  SpO2 93%  Intake/Output Summary (Last 24 hours) at 10/11/12 1046 Last data filed at 10/11/12 0913  Gross per 24 hour  Intake    240 ml  Output   1600 ml  Net  -1360 ml   Intake/Output: I/O last 3 completed shifts: In: 480 [P.O.:480] Out: 2100 [Urine:2100]  Intake/Output this shift:    Weight change: -2.496 kg (-5 lb 8 oz) NGE:[id number] ill-appearing CVS:no rub Resp:scattered rhonchi Abd:+BS Ext:+pedal edema   Recent Labs Lab 10/05/12 0500 10/06/12 0434 10/07/12 0442 10/08/12 0640 10/09/12 0648 10/09/12 0947 10/10/12 0500 10/11/12 0500  NA 138 138 138 141 140 139 140 138  K 5.0 4.2 4.0 3.8 4.0 4.0 4.0 4.3  CL 106 102 99 100 98 97 98 95*  CO2 25 25 30  34* 33* 31 33* 33*  GLUCOSE 168* 131* 201* 102* 145* 153* 141* 124*  BUN 44* 43* 41* 43* 50* 50* 52* 52*  CREATININE 1.38* 1.28 1.16 1.25 1.60* 1.61* 1.82* 1.73*  ALBUMIN 2.5* 2.6* 2.4* 2.3* 2.5*  --   --   --   CALCIUM 9.2 9.1 9.3 9.2 9.2 9.3 9.3 9.7  AST 14 12 10 11 11   --   --   --   ALT 5 6 5  <5 <5  --   --   --    Liver Function Tests:  Recent Labs Lab 10/07/12 0442 10/08/12 0640 10/09/12 0648  AST 10 11 11   ALT 5 <5 <5  ALKPHOS 168* 156* 160*  BILITOT 1.8* 1.4* 1.0  PROT 6.8 6.1 6.2  ALBUMIN 2.4* 2.3* 2.5*   No results found for this basename: LIPASE, AMYLASE,  in the last 168 hours No results found for this basename: AMMONIA,  in the last 168 hours CBC:  Recent Labs Lab 10/04/12 1146 10/04/12 2036 10/08/12 0812  WBC 8.4 6.6 6.4  NEUTROABS  --   --  4.6  HGB 10.7* 10.3* 10.0*  HCT 33.1* 33.0* 32.0*  MCV 79.6 80.7 81.2  PLT 126* 132* 111*   Cardiac Enzymes:  Recent Labs Lab 10/05/12 0050 10/05/12 0500 10/05/12 1435  TROPONINI <0.30  <0.30 <0.30   CBG:  Recent Labs Lab 10/10/12 1055 10/10/12 1601 10/10/12 2121 10/11/12 0735 10/11/12 0758  GLUCAP 152* 135* 143* 108* 91    Iron Studies: No results found for this basename: IRON, TIBC, TRANSFERRIN, FERRITIN,  in the last 72 hours Studies/Results: Dg Chest Port 1 View  10/10/2012   *RADIOLOGY REPORT*  Clinical Data: Congestive heart failure, dyspnea, shortness of breath  PORTABLE CHEST - 1 VIEW  Comparison: 10/04/2012  Findings: Cardiomegaly again noted.  Status post CABG.  Stable vascular congestion and hazy perihilar and lower lobe airspace disease consistent with pulmonary edema.  Probable bilateral small pleural effusion with bilateral basilar atelectasis or infiltrate.  IMPRESSION: Stable vascular congestion and hazy perihilar and lower lobe airspace disease consistent with pulmonary edema.  Probable bilateral small pleural effusion with bilateral basilar atelectasis or infiltrate.   Original Report Authenticated By: Natasha Mead, M.D.   . arformoterol  15 mcg Nebulization BID  . aspirin EC  81 mg Oral Daily  . atorvastatin  40 mg Oral q1800  . calcium  carbonate  2 tablet Oral Daily  . clopidogrel  75 mg Oral Q breakfast  . furosemide  120 mg Intravenous BID  . HYDROmorphone  1 mg Oral Q6H  . ipratropium  0.5 mg Nebulization BID  . isosorbide mononitrate  60 mg Oral Daily  . lamoTRIgine  200 mg Oral Daily  . LORazepam  1 mg Intravenous Q12H  . LORazepam  1 mg Oral Once  . magnesium gluconate  500 mg Oral BID  . methylPREDNISolone (SOLU-MEDROL) injection  20 mg Intravenous Daily  . mycophenolate  500 mg Oral BID  . nystatin-triamcinolone ointment   Topical QID  . senna-docusate  1 tablet Oral QHS  . tacrolimus  5 mg Oral BID  . tamsulosin  0.8 mg Oral Daily  . Vilazodone HCl  20 mg Oral Daily  . vitamin C  1,000 mg Oral Daily    BMET    Component Value Date/Time   NA 138 10/11/2012 0500   K 4.3 10/11/2012 0500   CL 95* 10/11/2012 0500   CO2 33* 10/11/2012  0500   GLUCOSE 124* 10/11/2012 0500   BUN 52* 10/11/2012 0500   CREATININE 1.73* 10/11/2012 0500   CALCIUM 9.7 10/11/2012 0500   GFRNONAA 38* 10/11/2012 0500   GFRAA 44* 10/11/2012 0500   CBC    Component Value Date/Time   WBC 6.4 10/08/2012 0812   RBC 3.94* 10/08/2012 0812   RBC 3.70* 03/24/2009 0357   HGB 10.0* 10/08/2012 0812   HCT 32.0* 10/08/2012 0812   PLT 111* 10/08/2012 0812   MCV 81.2 10/08/2012 0812   MCH 25.4* 10/08/2012 0812   MCHC 31.3 10/08/2012 0812   RDW 17.3* 10/08/2012 0812   LYMPHSABS 1.1 10/08/2012 0812   MONOABS 0.6 10/08/2012 0812   EOSABS 0.2 10/08/2012 0812   BASOSABS 0.0 10/08/2012 0812     Impression:  1. AKI/CKD 2. Acute on chronic resp failure- diuresing 3. COPD / home O2 4. Renal transplant 2002 baseline creat 1.0-1.6 5. Combined syst/diast heart failure, EF 40-45% 6. Recurrent loculated R pleural effusion 7. Parkinson's disease 8. Chronic debility 9. CAD w CABG 2011 10. 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 Severe debilitation 12. ACDz Plan:  1. Pt with multiple end-stage disease processes (COPD, CHF, and AKI/CKD) with poor functional status, QOL, and worsening overall health.  Appreciate Palliative Care, Cardiology, and pulmonology's input.  I agree that his prognosis for recovery of these irreversible processes is unlikely and his mental status has also deteriorated.  I concur with [facility] for hospice and CMO 2.   Aubriella Perezgarcia A

## 2012-10-11 NOTE — Progress Notes (Signed)
Dr Benjamine Mola spoke with pt's wife. Wife met with palliative care yesterday . Orders received. D/C telemetry monitor per order. At 0745 Pt RR up to 30 trying to climb out of bed. Pt medicated with Ativan 1 mg IV at 0805 for increased Resp rate and increased agitation. 02 at 5 L N/C with humidification.

## 2012-10-11 NOTE — Progress Notes (Signed)
Nutrition Brief Note  Chart reviewed. Pt now transitioning to comfort care.  No further nutrition interventions warranted at this time.  Please re-consult as needed.   Drianna Chandran MS, RD, LDN Pager: [phone] After-hours pager: [phone]    

## 2012-10-11 NOTE — Progress Notes (Signed)
Patient ID: John Novel., male   DOB: [birth year], 72 y.o.   MRN: [id number]   SUBJECTIVE:   Breathing worse yesterday. Started back on IV lasix. Switched to comfort care. This morning he says his breathing is better but he looks agitated, dyspneic and uncomfortable.   Marland Kitchen arformoterol  15 mcg Nebulization BID  . aspirin EC  81 mg Oral Daily  . atorvastatin  40 mg Oral q1800  . calcium carbonate  2 tablet Oral Daily  . clopidogrel  75 mg Oral Q breakfast  . furosemide  120 mg Intravenous BID  . HYDROmorphone  1 mg Oral Q6H  . insulin aspart  0-15 Units Subcutaneous TID WC  . insulin glargine  10 Units Subcutaneous Daily  . ipratropium  0.5 mg Nebulization BID  . isosorbide mononitrate  60 mg Oral Daily  . lamoTRIgine  200 mg Oral Daily  . LORazepam  1 mg Intravenous Q12H  . LORazepam  1 mg Oral Once  . magnesium gluconate  500 mg Oral BID  . methylPREDNISolone (SOLU-MEDROL) injection  20 mg Intravenous Daily  . mycophenolate  500 mg Oral BID  . nystatin-triamcinolone ointment   Topical QID  . pantoprazole  40 mg Oral Q1200  . senna-docusate  1 tablet Oral QHS  . tacrolimus  5 mg Oral BID  . tamsulosin  0.8 mg Oral Daily  . Vilazodone HCl  20 mg Oral Daily  . vitamin C  1,000 mg Oral Daily      Filed Vitals:   10/10/12 1349 10/10/12 1402 10/10/12 2041 10/11/12 0452  BP: 130/54  140/57 141/59  Pulse: 80  88 70  Temp: 97.6 F (36.4 C)  97.2 F (36.2 C) 98.9 F (37.2 C)  TempSrc: Oral  Oral Oral  Resp: 18  20 18   Height:      Weight:    85.004 kg (187 lb 6.4 oz)  SpO2: 91% 97% 98% 93%    Intake/Output Summary (Last 24 hours) at 10/11/12 0744 Last data filed at 10/11/12 0451  Gross per 24 hour  Intake    480 ml  Output   1800 ml  Net  -1320 ml    LABS: Basic Metabolic Panel:  Recent Labs  40/98/11 0648  10/10/12 0500 10/11/12 0500  NA 140  < > 140 138  K 4.0  < > 4.0 4.3  CL 98  < > 98 95*  CO2 33*  < > 33* 33*  GLUCOSE 145*  < > 141* 124*  BUN 50*   < > 52* 52*  CREATININE 1.60*  < > 1.82* 1.73*  CALCIUM 9.2  < > 9.3 9.7  MG 1.6  --   --   --   < > = values in this interval not displayed. Liver Function Tests:  Recent Labs  10/09/12 0648  AST 11  ALT <5  ALKPHOS 160*  BILITOT 1.0  PROT 6.2  ALBUMIN 2.5*   No results found for this basename: LIPASE, AMYLASE,  in the last 72 hours CBC:  Recent Labs  10/08/12 0812  WBC 6.4  NEUTROABS 4.6  HGB 10.0*  HCT 32.0*  MCV 81.2  PLT 111*   Cardiac Enzymes: No results found for this basename: CKTOTAL, CKMB, CKMBINDEX, TROPONINI,  in the last 72 hours BNP: No components found with this basename: POCBNP,  D-Dimer: No results found for this basename: DDIMER,  in the last 72 hours Hemoglobin A1C: No results found for this basename: HGBA1C,  in  the last 72 hours Fasting Lipid Panel: No results found for this basename: CHOL, HDL, LDLCALC, TRIG, CHOLHDL, LDLDIRECT,  in the last 72 hours Thyroid Function Tests: No results found for this basename: TSH, T4TOTAL, FREET3, T3FREE, THYROIDAB,  in the last 72 hours Anemia Panel: No results found for this basename: VITAMINB12, FOLATE, FERRITIN, TIBC, IRON, RETICCTPCT,  in the last 72 hours  RADIOLOGY: Dg Chest Port 1 View  10/06/2012   *RADIOLOGY REPORT*  Clinical Data: Edema.  PORTABLE CHEST - 1 VIEW  Comparison: 10/05/2012  Findings: Patchy airspace densities, right side greater than left. Findings could represent asymmetric pulmonary edema and/or infection.  Heart size is grossly stable.  No evidence for pneumothorax.  IMPRESSION: Minimal change in the bilateral airspace disease, right side greater than left.   Original Report Authenticated By: Richarda Overlie, M.D.   Dg Chest Port 1 View  10/05/2012   *RADIOLOGY REPORT*  Clinical Data: Pulmonary edema.  Evaluate for respiratory status and lung status.  PORTABLE CHEST - 1 VIEW  Comparison: 10/04/2012.  Findings: Patchy bilateral airspace lung opacities are similar to the previous day's exam.   There is additional opacity at the right lung base consistent with atelectasis and a small effusion.  This is also stable.  There is no pneumothorax.  Changes from CABG surgery are stable.  The cardiac silhouette is normal in size.  IMPRESSION: Persistent bilateral airspace lung opacities with additional right lung base pleural fluid and atelectasis.  There is been no change from prior exam. The bilateral lung opacities are consistent with pulmonary edema.   Original Report Authenticated By: Amie Portland, M.D.   Dg Chest Port 1 View  10/04/2012   *RADIOLOGY REPORT*  Clinical Data: Shortness of breath.  PORTABLE CHEST - 1 VIEW  Comparison: 10/02/2012.  Findings: Diffuse slightly asymmetric air space disease similar to the recent examination.  Question pulmonary edema versus infectious infiltrate.  Cardiomegaly post CABG.  Calcified aorta.  IMPRESSION: Diffuse slightly asymmetric air space disease similar to the recent examination.  Question pulmonary edema versus infectious infiltrate. Recommend follow-up until clearance.  Cardiomegaly post CABG.  Calcified aorta.   Original Report Authenticated By: Lacy Duverney, M.D.   US Thoracentesis Asp Pleural Space W/img Guide  09/15/2012   *RADIOLOGY REPORT*  Clinical Data:  Patient with history of prior CABG, COPD, right pleural effusion, dyspnea.  Request is made for therapeutic right thoracentesis.  ULTRASOUND GUIDED  THERAPEUTIC RIGHT THORACENTESIS  An ultrasound guided thoracentesis was thoroughly discussed with the patient and questions answered.  The benefits, risks, alternatives and complications were also discussed.  The patient understands and wishes to proceed with the procedure.  Written consent was obtained.  Ultrasound was performed to localize and mark an adequate pocket of fluid in the right chest.  The area was then prepped and draped in the normal sterile fashion.  1% Lidocaine was used for local anesthesia.  Under ultrasound guidance a 19 gauge Yueh  catheter was introduced.  Thoracentesis was performed.  The catheter was removed and a dressing applied.  Complications:  none  Findings: A total of approximately 150 cc's of blood tinged serous fluid was removed. Due to the multiloculated nature of the collection and despite catheter manipulation, only the above amount of fluid could be aspirated at this time.  IMPRESSION: Successful ultrasound guided therapeutic right thoracentesis yielding 150 cc's of pleural fluid. Due to the multiloculated nature of the collection and small amount of fluid removed follow- up CT chest may be helpful for  further characterization.  Read by: Jeananne Rama, P.A.-C   Original Report Authenticated By: Irish Lack, M.D.    PHYSICAL EXAM General: Agitated. dyspneic Neck: JVP jaw , no thyromegaly or thyroid nodule.  Lungs: +rhonchi Decreased breath sounds at bases bilaterally.  CV: Nondisplaced PMI.  Mildly irregula S1/S2, no S3/S4, 3/6 SEM RUSB.  2/6 MR Abdomen: Soft, nontender, no hepatosplenomegaly, no distention.  Neurologic: Awake. agitated Extremities: No clubbing or cyanosis.   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 1. CHF: Acute on chronic diastolic with EF 30-86% and evidence for RV failure on echo.  2. COPD:  3. CKD:  4. 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 His HR is fine currently.  Agree with stopping pyridostigmine as he was bradycardic at admission.  So far, not lightheaded with standing.   5. UTI: Enterobacter in urine, Cipro and Bactrim sensitive.  Stopping Bactrim today with rising creatinine.  6. CAD: s/p CABG.  Stable, on ASA/Plavix/atorvastatin.  He is clearly end-stage and  uncomfortable. Volume status is improved but neck veins still up. Agree with continue diuresis as part as comfort care. Would consider morphine drip and moving to Palliative Care floor to help insure comfort.   Arvilla Meres, MD 10/11/2012 7:44 AM

## 2012-10-11 NOTE — Progress Notes (Signed)
[facility] Progress Note    John [name]. ZOX:[id number] DOB: 08-07-1940 DOA: 10/04/2012 PCP: Hoyle Sauer, MD  Brief narrative: 72 y/o M with complex PMH including CAD s/p CABG 2011 (post-op course complicated by pulmonary edema requiring intubation, renal failure requiring CVVH, and atrial fibrillation), PAD, renal transplantation on immunosuppressent, orthostatic hypotension/ShyDrager, Parkinson's disease, prostate CA, mod AoS, pleural effusion s/p thoracenteses (recently 08/2012), COPD with chronic resp failure on home O2 at 4l/min and chronic prednisone, and intermittent heart block (1st degree, type 1 2nd degree) who presented to [facility] 8/15 with dyspnea.   The patient underwent R thoracentesis 09/15/12 for loculated pleural effusion. He also recently completed a course of Augmentin and a prednisone taper for dyspnea. Over 10 days he developed significant DOE, dry cough, LEE, and increase in abdominal girth. Decreased UOP was noted per wife. He was evaluated in the pulmonary office at which time Lasix was increased to 80mg  BID. LE dopplers were negative for DVT. He saw Dr. Abel Presto and K was discontinued due to level 5.7, prednisone was decreased from 10->5mg , and Lasix was increased to 120mg  BID. He was still significantly SOB so his wife drove him to the ER. He was hypoxic at 87% on Juncal thus was placed on bipap. He had been intermittently bradycardic down to the mid 30's in [place]. At times when dropping slower he has a 2:1 pattern but per discussion with Dr. Graciela Husbands with EP, this is likely [place] as well given surrounding rhythm. pBNP 6763, troponin neg x 1, BUN/Cr 44/1.39, K 5.1, Hgb 10.7/plt 126. CXR shows diffuse slightly asymmetric air space disease similar to the recent examination, question pulmonary edema versus infectious infiltrate, cardiomegaly post CABG, calcified aorta. He denied any fever, chills, orthopnea, CP, palpitations, or syncope. He received 80mg  IV Lasix  and atrovent/proventil neb in the ER. Patient seen by Cardiology in the ED.   Patient initially was admitted to 4 E, but rapid response team was called when patient went into respiratory distress and patient was transferred to 2900 to initiate BIPAP tx.  Assessment/Plan: Comfort care: 8/21; continue diuresis- ?gtt for pain medications- defer to palliative- spoke at length with wife- offering re-assurance D/c SSI -?need to d/c more medications  Acute on chronic hypoxic respiratory failure  slowly improving - due to severe baseline COPD + CHF   Acute on Chronic combined diastolic and right heart failure Admission weight= 215lbs - diuretic dosing as per Renal + CHF team -  Enterobacter UTI D/c foley - needs 10 day tx course to attempt eradication/sterilization of urinary tract  S/P Renal transplant 2002 Nephrology following - admission creatinine = 1.44 - baseline creat 1.0-1.6 - diuresis being slowed to avoid worsening renal fxn  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 Delford Field - Palliative Care consulted- pt appeared to be not yet ready for hospice but his wife is considering it- see note by Dr Phillips Odor today  DM type 2 insulin pump was disconnected and taken home - cont to follow CBG w/o change today   Parkinson's disease Cont home medical regimen  CAD w CABG 2011  Pulmonary hypertension  Recurrent loculated R pleural effusion  Stable   HYPERLIPIDEMIA  Code Status: NO CODE BLUE Family Communication: none Disposition Plan: comfort care- residential hospice  Consultants: Cardiology Nephrology Pulmonology  Procedures: Echocardiogram 10/05/2012 - Comparison to prior study March 2014. There is mild LVH with LVEF 55%, mld inferior hypokinesis, indeterminate diastolic function.  MIld biatrial enlargement. Overall moderate aortic stenosis, functionally bicuspid valve - similar gradients. RV enlargement and dysfunction with PASP now increased to 64 mmHg, CVP elevated.  Antibiotics: Bactrim 8/18 >>   DVT prophylaxis: SCDs  HPI/Subjective: Resting comfortably after ativan- nursing reports lots of agitation before medication given-climbing out of bed  Objective: Blood pressure 141/59, pulse 70, temperature 98.9 F (37.2 C), temperature source Oral, resp. rate 18, height 6\' 2"  (1.88 m), weight 85.004 kg (187 lb 6.4 oz), SpO2 93.00%.  Intake/Output Summary (Last 24 hours) at 10/11/12 0754 Last data filed at 10/11/12 0451  Gross per 24 hour  Intake    480 ml  Output   1800 ml  Net  -1320 ml   Exam: General: sleepy, No acute respiratory distress at rest  Lungs: distant bs th/o all fields - no active wheeze  Cardiovascular: Regular rate without gallop or rub normal S1 and S2 Abdomen: Nontender, nondistended, soft, bowel sounds positive, no rebound, no ascites, no appreciable mass Extremities: No significant cyanosis, clubbing, or edema bilateral lower extremities  Data Reviewed: Basic Metabolic Panel:  Recent Labs Lab 10/05/12 0500 10/06/12 0434 10/07/12 0442 10/08/12 0640 10/09/12 0648 10/09/12 0947 10/10/12 0500 10/11/12 0500  NA 138 138 138 141 140 139 140 138  K 5.0 4.2 4.0 3.8 4.0 4.0 4.0 4.3  CL 106 102 99 100 98 97 98 95*  CO2 25 25 30  34* 33* 31 33* 33*  GLUCOSE 168* 131* 201* 102* 145* 153* 141* 124*  BUN 44* 43* 41* 43* 50* 50* 52* 52*  CREATININE 1.38* 1.28 1.16 1.25 1.60* 1.61* 1.82* 1.73*  CALCIUM 9.2 9.1 9.3 9.2 9.2 9.3 9.3 9.7  MG 1.9 1.7 1.5 1.4* 1.6  --   --   --    Liver Function Tests:  Recent Labs Lab 10/05/12 0500 10/06/12 0434 10/07/12 0442 10/08/12 0640 10/09/12 0648  AST 14 12 10 11 11   ALT 5 6 5  <5 <5  ALKPHOS 168* 172* 168* 156* 160*  BILITOT 1.2 1.6* 1.8* 1.4* 1.0  PROT 6.3 6.8 6.8 6.1 6.2  ALBUMIN 2.5*  2.6* 2.4* 2.3* 2.5*   CBC:  Recent Labs Lab 10/04/12 1146 10/04/12 2036 10/08/12 0812  WBC 8.4 6.6 6.4  NEUTROABS  --   --  4.6  HGB 10.7* 10.3* 10.0*  HCT 33.1* 33.0* 32.0*  MCV 79.6 80.7 81.2  PLT 126* 132* 111*   Cardiac Enzymes:  Recent Labs Lab 10/05/12 0050 10/05/12 0500 10/05/12 1435  TROPONINI <0.30 <0.30 <0.30   BNP (last 3 results)  Recent Labs  05/20/12 0926 10/02/12 1702 10/04/12 1146  PROBNP 513.0* 662.0* 6763.0*   CBG:  Recent Labs Lab 10/09/12 2113 10/10/12 0605 10/10/12 1055 10/10/12 1601 10/10/12 2121  GLUCAP 130* 114* 152* 135* 143*    Recent Results (from the past 240 hour(s))  URINE CULTURE     Status: None   Collection Time    10/04/12  2:36 PM      Result Value Range Status   Specimen Description URINE, CATHETERIZED   Final   Special Requests NONE   Final   Culture  Setup Time     Final   Value: 10/04/2012 16:21     Performed at [facility] Count     Final   Value: >=100,000 COLONIES/ML     Performed  at [facility]     Final   Value: ENTEROBACTER CLOACAE     Performed at [facility]   Report Status 10/07/2012 FINAL   Final   Organism ID, Bacteria ENTEROBACTER CLOACAE   Final     Studies:  Recent x-ray studies have been reviewed in detail by the Attending Physician  Scheduled Meds:  Scheduled Meds: . arformoterol  15 mcg Nebulization BID  . aspirin EC  81 mg Oral Daily  . atorvastatin  40 mg Oral q1800  . calcium carbonate  2 tablet Oral Daily  . clopidogrel  75 mg Oral Q breakfast  . furosemide  120 mg Intravenous BID  . HYDROmorphone  1 mg Oral Q6H  . insulin aspart  0-15 Units Subcutaneous TID WC  . insulin glargine  10 Units Subcutaneous Daily  . ipratropium  0.5 mg Nebulization BID  . isosorbide mononitrate  60 mg Oral Daily  . lamoTRIgine  200 mg Oral Daily  . LORazepam  1 mg Intravenous Q12H  . LORazepam  1 mg Oral Once  . magnesium gluconate  500 mg Oral  BID  . methylPREDNISolone (SOLU-MEDROL) injection  20 mg Intravenous Daily  . mycophenolate  500 mg Oral BID  . nystatin-triamcinolone ointment   Topical QID  . pantoprazole  40 mg Oral Q1200  . senna-docusate  1 tablet Oral QHS  . tacrolimus  5 mg Oral BID  . tamsulosin  0.8 mg Oral Daily  . Vilazodone HCl  20 mg Oral Daily  . vitamin C  1,000 mg Oral Daily   Time spent on care of this patient:   Marlin Canary, MD  [facility] [phone] Pager   10/11/2012, 7:54 AM   LOS: 7 days

## 2012-10-11 NOTE — Progress Notes (Addendum)
Started new IV and Fentanyl gt @ 1 ml/hr.  Verified by 2nd RN. Pt has been restless multiple periods during the day.Pt has received Haldol, Ativan, Haldol, and Benadryl. See med rec for exact times and dosages. Pt is palliative care and wife met several times today with MD's about pt's care.

## 2012-10-11 NOTE — Progress Notes (Signed)
Agree with PT discharge.  Marc Leichter, PT DPT [phone]  

## 2012-10-12 DIAGNOSIS — G20 Parkinson's disease: Secondary | ICD-10-CM

## 2012-10-12 DIAGNOSIS — E119 Type 2 diabetes mellitus without complications: Secondary | ICD-10-CM

## 2012-10-12 DIAGNOSIS — I25810 Atherosclerosis of coronary artery bypass graft(s) without angina pectoris: Secondary | ICD-10-CM

## 2012-10-12 DIAGNOSIS — E785 Hyperlipidemia, unspecified: Secondary | ICD-10-CM

## 2012-10-12 DIAGNOSIS — J449 Chronic obstructive pulmonary disease, unspecified: Secondary | ICD-10-CM

## 2012-10-12 NOTE — Progress Notes (Signed)
Patient evaluated after fentanyl infusion started and sedation medications were given. H was extremely calm, resting peacefully and looked probably the best I have seen him since he has been in the hospital from a comfort standpoint. NO family were at bedside, hopefully they will be able to rest now-wife was extremely fatigued earlier today. Will follow.Gave instructions to cancel PM CBG and vitals and allow for rest.  Anderson Malta, DO Palliative Medicine

## 2012-10-12 NOTE — Progress Notes (Signed)
AM meds held d/t sedation. No visible signs of distress noted, patient resting comfortably. Will continue to monitor. John [name]

## 2012-10-12 NOTE — Progress Notes (Signed)
SUBJECTIVE:  Resting comfortably although states he still has a sense of SOB  OBJECTIVE:   Vitals:   Filed Vitals:   10/11/12 1515 10/11/12 1732 10/12/12 0729 10/12/12 0908  BP: 140/71 132/67 141/76   Pulse: 65 68 79   Temp: 98.7 F (37.1 C)  97.5 F (36.4 C)   TempSrc: Oral  Oral   Resp: 18 22 22    Height:      Weight:   84.3 kg (185 lb 13.6 oz)   SpO2: 93% 94% 95% 96%   I&O's:   Intake/Output Summary (Last 24 hours) at 10/12/12 1149 Last data filed at 10/12/12 0700  Gross per 24 hour  Intake      0 ml  Output    975 ml  Net   -975 ml   TELEMETRY: Reviewed telemetry pt in NSR:     PHYSICAL EXAM General: Well developed, well nourished, in no acute distress NECK:  JVP to jaw Lungs:   Decreased BS Heart:   HRRR S1 S2 Pulses are 2+ & equal.2/6 SM at RUSB Abdomen: Bowel sounds are positive, abdomen soft and non-tender without masses  Extremities:   No clubbing, cyanosis or edema.  DP +1 Neuro: Alert and oriented X 3. Psych:  Good affect, responds appropriately   LABS: Basic Metabolic Panel:  Recent Labs  16/10/96 0500 10/11/12 0500  NA 140 138  K 4.0 4.3  CL 98 95*  CO2 33* 33*  GLUCOSE 141* 124*  BUN 52* 52*  CREATININE 1.82* 1.73*  CALCIUM 9.3 9.7   Coag Panel:   Lab Results  Component Value Date   INR 1.18 06/02/2011   INR 1.1* 12/01/2010   INR 1.0 ratio 01/05/2010    RADIOLOGY: Dg Chest 1 View  09/12/2012   *RADIOLOGY REPORT*  Clinical Data: Post right-sided thoracentesis  CHEST - 1 VIEW  Comparison: 09/11/2012; 06/02/2011; 05/27/2011; chest CT - 05/27/2011  Findings:  Interval reduction in persistent small right-sided pleural effusion post thoracentesis.  No pneumothorax.  Unchanged small left-sided pleural effusion.  Grossly unchanged cardiac silhouette and mediastinal contours post median sternotomy and CABG. The pulmonary vasculature remains indistinct with cephalization of flow.  Grossly unchanged perihilar and bibasilar opacities.  No new focal  airspace opacities. Unchanged bones.  IMPRESSION: 1.  Interval reduction of persistent small right-sided pleural effusion post thoracentesis.  No pneumothorax. 2.  Persistent findings of pulmonary edema and perihilar/bibasilar opacities, atelectasis versus infiltrate.   Original Report Authenticated By: Tacey Ruiz, MD   Dg Chest 2 View  10/02/2012   *RADIOLOGY REPORT*  Clinical Data: Cough, shortness of breath  CHEST - 2 VIEW  Comparison: 09/20/2012  Findings: Hazy opacity overlying the right mid/lower lung likely reflects a moderate layering/loculated right pleural effusion.  Suspected small left pleural effusion/pleural thickening.  These findings are unchanged.  Cardiomegaly. Postsurgical changes related to prior CABG.  Visualized osseous structures are within normal limits.  IMPRESSION: Moderate layering/loculated right pleural effusion.  Small left pleural effusion/pleural thickening.  When compared the prior study, these findings are unchanged.   Original Report Authenticated By: Charline Bills, M.D.   Dg Chest 2 View  09/20/2012   *RADIOLOGY REPORT*  Clinical Data: Cough.  Dyspnea on exertion.  CHEST - 2 VIEW  Comparison: 07/24/2014and CT chest from 05/30/2011 and chest x-ray from 05/10/2011.  Findings: Two-view exam shows cardiomegaly with underlying diffuse chronic interstitial coarsening.  Vascular congestion noted.  Pleural thickening versus posterior loculated pleural fluid noted on the right.  Less prominent pleural  thickening/fluid is noted on the left. There is bibasilar atelectasis or infiltrate  IMPRESSION: Two-view chest x-ray is essentially unchanged from the 05/10/2011 exam.  Loculated posterior pleural fluid with overlying lower lobe collapse / consolidation.   Original Report Authenticated By: Kennith Center, M.D.   Ct Head Wo Contrast  10/08/2012   *RADIOLOGY REPORT*  Clinical Data: Altered mental status.  CT HEAD WITHOUT CONTRAST  Technique:  Contiguous axial images were obtained  from the base of the skull through the vertex without contrast.  Comparison: Head CT scan 03/11/2009.  Findings: There is some cortical atrophy and chronic microvascular ischemic change.  No evidence of acute abnormality including infarct, hemorrhage, mass lesion, mass effect, midline shift or abnormal extra-axial fluid collection is identified.  The calvarium is intact.  No hydrocephalus or pneumocephalus.  IMPRESSION: No acute finding.  Stable compared to prior exam.   Original Report Authenticated By: Holley Dexter, M.D.   Dg Chest Port 1 View  10/10/2012   *RADIOLOGY REPORT*  Clinical Data: Congestive heart failure, dyspnea, shortness of breath  PORTABLE CHEST - 1 VIEW  Comparison: 10/04/2012  Findings: Cardiomegaly again noted.  Status post CABG.  Stable vascular congestion and hazy perihilar and lower lobe airspace disease consistent with pulmonary edema.  Probable bilateral small pleural effusion with bilateral basilar atelectasis or infiltrate.  IMPRESSION: Stable vascular congestion and hazy perihilar and lower lobe airspace disease consistent with pulmonary edema.  Probable bilateral small pleural effusion with bilateral basilar atelectasis or infiltrate.   Original Report Authenticated By: Natasha Mead, M.D.   Dg Chest Port 1 View  10/07/2012   *RADIOLOGY REPORT*  Clinical Data: Follow up pulmonary edema  PORTABLE CHEST - 1 VIEW  Comparison: 10/06/2012  Findings: Previous median sternotomy CABG procedure.  There is moderate cardiac enlargement noted. Bilateral pleural effusions and pulmonary edema is unchanged compared with previous exam.  IMPRESSION:  1.  No change in CHF.   Original Report Authenticated By: Signa Kell, M.D.   Dg Chest Port 1 View  10/06/2012   *RADIOLOGY REPORT*  Clinical Data: Edema.  PORTABLE CHEST - 1 VIEW  Comparison: 10/05/2012  Findings: Patchy airspace densities, right side greater than left. Findings could represent asymmetric pulmonary edema and/or infection.  Heart  size is grossly stable.  No evidence for pneumothorax.  IMPRESSION: Minimal change in the bilateral airspace disease, right side greater than left.   Original Report Authenticated By: Richarda Overlie, M.D.   Dg Chest Port 1 View  10/05/2012   *RADIOLOGY REPORT*  Clinical Data: Pulmonary edema.  Evaluate for respiratory status and lung status.  PORTABLE CHEST - 1 VIEW  Comparison: 10/04/2012.  Findings: Patchy bilateral airspace lung opacities are similar to the previous day's exam.  There is additional opacity at the right lung base consistent with atelectasis and a small effusion.  This is also stable.  There is no pneumothorax.  Changes from CABG surgery are stable.  The cardiac silhouette is normal in size.  IMPRESSION: Persistent bilateral airspace lung opacities with additional right lung base pleural fluid and atelectasis.  There is been no change from prior exam. The bilateral lung opacities are consistent with pulmonary edema.   Original Report Authenticated By: Amie Portland, M.D.   Dg Chest Port 1 View  10/04/2012   *RADIOLOGY REPORT*  Clinical Data: Shortness of breath.  PORTABLE CHEST - 1 VIEW  Comparison: 10/02/2012.  Findings: Diffuse slightly asymmetric air space disease similar to the recent examination.  Question pulmonary edema versus infectious infiltrate.  Cardiomegaly post CABG.  Calcified aorta.  IMPRESSION: Diffuse slightly asymmetric air space disease similar to the recent examination.  Question pulmonary edema versus infectious infiltrate. Recommend follow-up until clearance.  Cardiomegaly post CABG.  Calcified aorta.   Original Report Authenticated By: Lacy Duverney, M.D.   US Thoracentesis Asp Pleural Space W/img Guide  09/15/2012   *RADIOLOGY REPORT*  Clinical Data:  Patient with history of prior CABG, COPD, right pleural effusion, dyspnea.  Request is made for therapeutic right thoracentesis.  ULTRASOUND GUIDED  THERAPEUTIC RIGHT THORACENTESIS  An ultrasound guided thoracentesis was  thoroughly discussed with the patient and questions answered.  The benefits, risks, alternatives and complications were also discussed.  The patient understands and wishes to proceed with the procedure.  Written consent was obtained.  Ultrasound was performed to localize and mark an adequate pocket of fluid in the right chest.  The area was then prepped and draped in the normal sterile fashion.  1% Lidocaine was used for local anesthesia.  Under ultrasound guidance a 19 gauge Yueh catheter was introduced.  Thoracentesis was performed.  The catheter was removed and a dressing applied.  Complications:  none  Findings: A total of approximately 150 cc's of blood tinged serous fluid was removed. Due to the multiloculated nature of the collection and despite catheter manipulation, only the above amount of fluid could be aspirated at this time.  IMPRESSION: Successful ultrasound guided therapeutic right thoracentesis yielding 150 cc's of pleural fluid. Due to the multiloculated nature of the collection and small amount of fluid removed follow- up CT chest may be helpful for further characterization.  Read by: Jeananne Rama, P.A.-C   Original Report Authenticated By: Irish Lack, M.D.   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  1. CHF: Acute on chronic diastolic with EF 04-54% and evidence for RV failure on echo.  2. COPD:  3. CKD:  4. 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 His HR is fine currently. Agree with stopping pyridostigmine as he was bradycardic at admission. So far, not lightheaded with standing.  5. UTI: Enterobacter in urine, Cipro and Bactrim sensitive. Stopping Bactrim today with rising creatinine.  6. CAD: s/p CABG. Stable, on ASA/Plavix/atorvastatin.    He was agitated this am but appears to be resting very comfortable after Haldol.  Will continue IV Lasix gtt as part of comfort care.  Receiving Fentanyl gtt for comfort.    Quintella Reichert, MD  10/12/2012  11:49 AM

## 2012-10-12 NOTE — Progress Notes (Signed)
Patient remains asleep, resting comfortably.  Midnight dose of IV Valium held d/t sedation. No visible signs of distress. Will continue to monitor. [name]

## 2012-10-12 NOTE — Progress Notes (Signed)
Patient severely agitated. Permission obtained from wife to shift focus to comfort completely and initiate a continuous opiate infusion for his comfort. He is severely itching with the hydromorphone-will start him on Fentanyl Infusion with bolus dosing for rescue and give him prn haldol, ativan for his severe agitation. This appears to be terminal delirium.  Will continue to follow and support family-his daughters are coming in to town this weekend from [facility].  Anderson Malta, DO Palliative Medicine

## 2012-10-12 NOTE — Progress Notes (Signed)
Patient confused, agitated and restless. IV PRN Haldol administered as ordered.  Will continue to monitor. [name]

## 2012-10-12 NOTE — Consult Note (Signed)
[facility] Liaison: Received referral for family interest in [place] from CSW Lovette Cliche at 4:34pm 10/11/2012. Spoke with spouse by phone at that time and agreed to meet with family today which we did. Answered questions, explained facility, staffing, charges, philosophy and approach to care. Encouraged family to tour in person, they have taken virtual tour online. They are agreeable to transfer when room becomes available. Will follow up with spouse Monday or sooner if room becomes available. Thank you. Forrestine Him LCSW [phone]

## 2012-10-12 NOTE — Progress Notes (Signed)
[facility] Progress Note    John [name]. ZOX:[id number] DOB: 23-Jan-1941 DOA: 10/04/2012 PCP: Hoyle Sauer, MD  Brief narrative: 72 y/o M with complex PMH including CAD s/p CABG 2011 (post-op course complicated by pulmonary edema requiring intubation, renal failure requiring CVVH, and atrial fibrillation), PAD, renal transplantation on immunosuppressent, orthostatic hypotension/ShyDrager, Parkinson's disease, prostate CA, mod AoS, pleural effusion s/p thoracenteses (recently 08/2012), COPD with chronic resp failure on home O2 at 4l/min and chronic prednisone, and intermittent heart block (1st degree, type 1 2nd degree) who presented to [facility] 8/15 with dyspnea.   The patient underwent R thoracentesis 09/15/12 for loculated pleural effusion. He also recently completed a course of Augmentin and a prednisone taper for dyspnea. Over 10 days he developed significant DOE, dry cough, LEE, and increase in abdominal girth. Decreased UOP was noted per wife. He was evaluated in the pulmonary office at which time Lasix was increased to 80mg  BID. LE dopplers were negative for DVT. He saw Dr. Abel Presto and K was discontinued due to level 5.7, prednisone was decreased from 10->5mg , and Lasix was increased to 120mg  BID. He was still significantly SOB so his wife drove him to the ER. He was hypoxic at 87% on [place] thus was placed on bipap. He had been intermittently bradycardic down to the mid 30's in [place]. At times when dropping slower he has a 2:1 pattern but per discussion with Dr. Graciela Husbands with EP, this is likely [place] as well given surrounding rhythm. pBNP 6763, troponin neg x 1, BUN/Cr 44/1.39, K 5.1, Hgb 10.7/plt 126. CXR shows diffuse slightly asymmetric air space disease similar to the recent examination, question pulmonary edema versus infectious infiltrate, cardiomegaly post CABG, calcified aorta. He denied any fever, chills, orthopnea, CP, palpitations, or syncope. He received 80mg  IV Lasix  and atrovent/proventil neb in the ER. Patient seen by Cardiology in the ED.   Patient initially was admitted to 4 E, but rapid response team was called when patient went into respiratory distress and patient was transferred to 2900 to initiate BIPAP tx.  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 Delford Field - Palliative Care consulted- pt appeared to be not yet ready for hospice but his wife is considering it- see note by Dr Phillips Odor today  DM type 2 insulin pump was disconnected and taken home - cont to follow CBG w/o change today   Parkinson's disease Cont home medical regimen  CAD w CABG 2011  Pulmonary hypertension  Recurrent loculated R pleural effusion  Stable   HYPERLIPIDEMIA  Code Status: NO CODE BLUE Family Communication: none Disposition Plan: comfort care- residential hospice  Consultants: Cardiology Nephrology Pulmonology   Procedures: Echocardiogram 10/05/2012 - Comparison to prior study March 2014. There  is mild LVH with LVEF 55%, mld inferior hypokinesis, indeterminate diastolic function. MIld biatrial enlargement. Overall moderate aortic stenosis, functionally bicuspid  valve - similar gradients. RV enlargement and dysfunction with PASP now increased to 64 mmHg, CVP elevated.  Antibiotics: Bactrim 8/18 >>   DVT prophylaxis: SCDs  HPI/Subjective: Resting comfortably   Objective: Blood pressure 141/76, pulse 79, temperature 97.5 F (36.4 C), temperature source Oral, resp. rate 22, height 6\' 2"  (1.88 m), weight 84.3 kg (185 lb 13.6 oz), SpO2 95.00%.  Intake/Output Summary (Last 24 hours) at 10/12/12 0744 Last data filed at 10/12/12 0700  Gross per 24 hour  Intake      0 ml  Output   1475 ml  Net  -1475 ml   Exam: General: sleepy, No acute respiratory distress at rest  Lungs: distant bs th/o all fields - no active wheeze  Cardiovascular: Regular rate without gallop or rub normal S1 and S2 Abdomen: Nontender, nondistended, soft, bowel sounds positive, no rebound, no ascites, no appreciable mass Extremities: No significant cyanosis, clubbing, or edema bilateral lower extremities  Data Reviewed: Basic Metabolic Panel:  Recent Labs Lab 10/06/12 0434 10/07/12 0442 10/08/12 0640 10/09/12 0648 10/09/12 0947 10/10/12 0500 10/11/12 0500  NA 138 138 141 140 139 140 138  K 4.2 4.0 3.8 4.0 4.0 4.0 4.3  CL 102 99 100 98 97 98 95*  CO2 25 30 34* 33* 31 33* 33*  GLUCOSE 131* 201* 102* 145* 153* 141* 124*  BUN 43* 41* 43* 50* 50* 52* 52*  CREATININE 1.28 1.16 1.25 1.60* 1.61* 1.82* 1.73*  CALCIUM 9.1 9.3 9.2 9.2 9.3 9.3 9.7  MG 1.7 1.5 1.4* 1.6  --   --   --    Liver Function Tests:  Recent Labs Lab 10/06/12 0434 10/07/12 0442 10/08/12 0640 10/09/12 0648  AST 12 10 11 11   ALT 6 5 <5 <5  ALKPHOS 172* 168* 156* 160*  BILITOT 1.6* 1.8* 1.4* 1.0  PROT 6.8 6.8 6.1 6.2  ALBUMIN 2.6* 2.4* 2.3* 2.5*   CBC:  Recent Labs Lab 10/08/12 0812  WBC 6.4  NEUTROABS 4.6  HGB 10.0*  HCT 32.0*  MCV 81.2  PLT 111*   Cardiac Enzymes:  Recent Labs Lab 10/05/12 1435  TROPONINI <0.30   BNP (last 3 results)  Recent Labs  05/20/12 0926  10/02/12 1702 10/04/12 1146  PROBNP 513.0* 662.0* 6763.0*   CBG:  Recent Labs Lab 10/10/12 2121 10/11/12 0735 10/11/12 0758 10/11/12 1108 10/11/12 1559  GLUCAP 143* 108* 91 115* 165*    Recent Results (from the past 240 hour(s))  URINE CULTURE     Status: None   Collection Time    10/04/12  2:36 PM      Result Value Range Status   Specimen Description URINE, CATHETERIZED   Final   Special Requests NONE   Final   Culture  Setup Time     Final   Value: 10/04/2012 16:21     Performed at [facility] Count     Final   Value: >=100,000 COLONIES/ML     Performed at [facility]   Culture     Final   Value: ENTEROBACTER CLOACAE     Performed at [facility]   Report Status 10/07/2012 FINAL   Final   Organism ID, Bacteria ENTEROBACTER CLOACAE   Final     Studies:  Recent x-ray studies have been reviewed in detail by the Attending Physician  Scheduled Meds:  Scheduled Meds: . arformoterol  15 mcg Nebulization BID  . aspirin EC  81 mg Oral Daily  . atorvastatin  40 mg Oral q1800  . calcium carbonate  2 tablet Oral Daily  . clopidogrel  75 mg Oral Q breakfast  . diazepam  2.5 mg Intravenous Q6H  . furosemide  120 mg Intravenous BID  . ipratropium  0.5 mg Nebulization BID  . isosorbide mononitrate  60 mg Oral Daily  . lamoTRIgine  200 mg Oral Daily  . LORazepam  1 mg Oral Once  . magnesium gluconate  500 mg Oral BID  . methylPREDNISolone (SOLU-MEDROL) injection  20 mg Intravenous Daily  . mycophenolate  500 mg Oral BID  . nystatin-triamcinolone ointment   Topical QID  . senna-docusate  1 tablet Oral QHS  . tacrolimus  5 mg Oral BID  . tamsulosin  0.8 mg Oral Daily  . Vilazodone HCl  20 mg Oral Daily  . vitamin C  1,000 mg Oral Daily   Time spent on care of this patient:   Marlin Canary, DO  [facility] [phone] Pager   10/12/2012, 7:44 AM   LOS: 8 days

## 2012-10-13 DIAGNOSIS — J449 Chronic obstructive pulmonary disease, unspecified: Secondary | ICD-10-CM

## 2012-10-13 DIAGNOSIS — E785 Hyperlipidemia, unspecified: Secondary | ICD-10-CM

## 2012-10-13 DIAGNOSIS — G20 Parkinson's disease: Secondary | ICD-10-CM

## 2012-10-13 DIAGNOSIS — E119 Type 2 diabetes mellitus without complications: Secondary | ICD-10-CM

## 2012-10-13 DIAGNOSIS — J9600 Acute respiratory failure, unspecified whether with hypoxia or hypercapnia: Secondary | ICD-10-CM

## 2012-10-13 MED ORDER — SODIUM CHLORIDE 0.9 % IV SOLN
25.0000 mg | Freq: Four times a day (QID) | INTRAVENOUS | Status: DC | PRN
  Filled 2012-10-13 (×2): qty 1

## 2012-10-13 MED ORDER — BISACODYL 10 MG RE SUPP: 10.0000 mg | Freq: Every day | RECTAL | Status: DC | PRN

## 2012-10-13 MED ORDER — CHLORPROMAZINE HCL 25 MG/ML IJ SOLN
12.5000 mg | Freq: Once | INTRAMUSCULAR | Status: AC
  Administered 2012-10-13: 12.5 mg via INTRAVENOUS
  Filled 2012-10-13: qty 0.5

## 2012-10-13 MED ORDER — SODIUM CHLORIDE 0.9 % IV SOLN
12.5000 mg | Freq: Four times a day (QID) | INTRAVENOUS | Status: DC | PRN
  Filled 2012-10-13 (×2): qty 0.5

## 2012-10-13 NOTE — Progress Notes (Signed)
PULMONARY  / CRITICAL CARE MEDICINE  Name: John [name]. MRN: [id number] DOB: 1940-03-27    ADMISSION DATE:  10/04/2012 CONSULTATION DATE:  10/13/2012  REFERRING MD :  [facility] PRIMARY SERVICE:  [facility]  CHIEF COMPLAINT:  dyspnea   HISTORY OF PRESENT ILLNESS:   72 y.o. WM with Gold D copd oxygen dependent. Adm with CHF, UTI, sepsis, resp failure. Pt is slowly better. Pt is DNI/DNR.  Pt seen at request of family.  Pt in SDU of 2H. Pt and spouse open to hospice after snf rehab stay.  VITAL SIGNS: Temp:  [97.3 F (36.3 C)-98 F (36.7 C)] 97.3 F (36.3 C) (08/24 0443) Pulse Rate:  [81-85] 85 (08/24 0443) Resp:  [22] 22 (08/24 0443) BP: (154-159)/(67-70) 154/70 mmHg (08/24 0443) SpO2:  [93 %-100 %] 95 % (08/24 0912) Weight:  [83.3 kg (183 lb 10.3 oz)] 83.3 kg (183 lb 10.3 oz) (08/24 0443)  PHYSICAL EXAMINATION: General:  Ill appearing in nad, was agitated earlier, thorazine helped Neuro:  Non focal,sedated HEENT:  No jvd no tmg Cardiovascular:  Rrr, nl s1 s2 no s3 s4  Lungs:  Distant bs Abdomen:  Soft nt Musculoskeletal:  From, no joint issues Skin:  ecchymoses   Recent Labs Lab 10/09/12 0947 10/10/12 0500 10/11/12 0500  NA 139 140 138  K 4.0 4.0 4.3  CL 97 98 95*  CO2 31 33* 33*  BUN 50* 52* 52*  CREATININE 1.61* 1.82* 1.73*  GLUCOSE 153* 141* 124*    Recent Labs Lab 10/08/12 0812  HGB 10.0*  HCT 32.0*  WBC 6.4  PLT 111*   No results found.  ASSESSMENT / PLAN:  #1 Gold D COPD.  Stable at present. Cont neb meds and oxygen as Rx Pt too complex for home care. I agree with pall care team that residential hospice at [facility] is the best choice. Hopefully a bed will be available this week.  I apprec care of the PMT and Dr Heron Nay avail prn    Dorcas Carrow Pulmonary and Critical Care Medicine [facility] Pager: [phone]  10/13/2012, 1:18 PM

## 2012-10-13 NOTE — Progress Notes (Signed)
Patient continues to yell out and attempt to get OOB with increased confusion, agitation and restlessness.  PRN Haldol and Valium have been administered as ordered in addition to scheduled Valium.  Patient having c/o SOB also. Repositioned patient and increased O2 to 5.5L via nasal cannula, his SpO2 remained between 92-94%.  Encouraged patient to relax and take deep breaths.  Currently bedside with patient, will continue to monitor. [name]

## 2012-10-13 NOTE — Progress Notes (Addendum)
PRN Albuterol Neb administered to patient for c/o SOB; M.York notified. Will continue to monitor. [name]

## 2012-10-13 NOTE — Progress Notes (Signed)
Patient continues to be agitated and restless, scheduled IV Valium administered as ordered-patient still very restless.  Will continue to monitor. John [name]

## 2012-10-13 NOTE — Progress Notes (Addendum)
Mr. Smeltz is much more peaceful and comfortable, but starting have some breakthrough periods of agitation. His family is at bedside. Goals are full comfort. Will increase basal rate of fentanyl and advise nursing to use both prn and scheduled doses of valium as well as haldol for agitation. Plan is for [facility] when bed is available. Will follow.  Anderson Malta, DO Palliative Medicine

## 2012-10-13 NOTE — Progress Notes (Signed)
Patient continues have increased confusion, agitation and restlessness. Second dose of IV PRN Haldol administered. Will continue to monitor. John [name]

## 2012-10-13 NOTE — Progress Notes (Signed)
[facility] Progress Note    John [name]. 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   The patient underwent R thoracentesis 09/15/12 for loculated pleural effusion. He also recently completed a course of Augmentin and a prednisone taper for dyspnea. Over 10 days he developed significant DOE, dry cough, LEE, and increase in abdominal girth. Decreased UOP was noted per wife. He was evaluated in the pulmonary office at which time Lasix was increased to 80mg  BID. LE dopplers were negative for DVT. He saw Dr. Abel Presto and K was discontinued due to level 5.7, prednisone was decreased from 10->5mg , and Lasix was increased to 120mg  BID. He was still significantly SOB so his wife drove him to the ER. He was hypoxic at 87% on Darke thus was placed on bipap. He had been intermittently bradycardic down to the mid 30's in [place]. At times when dropping slower he has a 2:1 pattern but per discussion with Dr. Graciela Husbands with EP, this is likely [place] as well given surrounding rhythm. pBNP 6763, troponin neg x 1, BUN/Cr 44/1.39, K 5.1, Hgb 10.7/plt 126. CXR shows diffuse slightly asymmetric air space disease similar to the recent examination, question pulmonary edema versus infectious infiltrate, cardiomegaly post CABG, calcified aorta. He denied any fever, chills, orthopnea, CP, palpitations, or syncope. He received 80mg  IV Lasix  and atrovent/proventil neb in the ER. Patient seen by Cardiology in the ED.   Patient initially was admitted to 4 E, but rapid response team was called when patient went into respiratory distress and patient was transferred to 2900 to initiate BIPAP tx.  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 Delford Field - Palliative Care consulted- pt appeared to be not yet ready for hospice but his wife is considering it- see note by Dr Phillips Odor today  DM type 2 insulin pump was disconnected and taken home - cont to follow CBG w/o change today   Parkinson's disease Cont home medical regimen  CAD w CABG 2011  Pulmonary hypertension  Recurrent loculated R pleural effusion  Stable   HYPERLIPIDEMIA  Code Status: NO CODE BLUE Family Communication: none Disposition Plan: comfort care- residential hospice  Consultants: Cardiology Nephrology Pulmonology   Procedures: Echocardiogram 10/05/2012 - Comparison to prior study March 2014. There is  mild LVH with LVEF 55%, mld inferior hypokinesis, indeterminate diastolic function. MIld biatrial enlargement. Overall moderate aortic stenosis, functionally  bicuspid valve - similar gradients. RV enlargement and dysfunction with PASP now increased to 64 mmHg, CVP elevated.  Antibiotics: Bactrim 8/18 >>   DVT prophylaxis: SCDs  HPI/Subjective: agitated   Objective: Blood pressure 154/70, pulse 85, temperature 97.3 F (36.3 C), temperature source Oral, resp. rate 22, height 6\' 2"  (1.88 m), weight 83.3 kg (183 lb 10.3 oz), SpO2 95.00%.  Intake/Output Summary (Last 24 hours) at 10/13/12 1103 Last data filed at 10/13/12 0657  Gross per 24 hour  Intake    480 ml  Output   1960 ml  Net  -1480 ml   Exam: General: sleepy, No acute respiratory distress at rest  Lungs: distant bs th/o all fields - no active wheeze  Cardiovascular: Regular rate without gallop or rub normal S1 and S2 Abdomen: Nontender, nondistended, soft, bowel sounds positive, no rebound, no ascites, no appreciable mass Extremities: No significant cyanosis, clubbing, or edema bilateral lower extremities  Data Reviewed: Basic Metabolic Panel:  Recent Labs Lab 10/07/12 0442 10/08/12 0640 10/09/12 0648 10/09/12 0947 10/10/12 0500 10/11/12 0500  NA 138 141 140 139 140 138  K 4.0 3.8 4.0 4.0 4.0 4.3  CL 99 100 98 97 98 95*  CO2 30 34* 33* 31 33* 33*  GLUCOSE 201* 102* 145* 153* 141* 124*  BUN 41* 43* 50* 50* 52* 52*  CREATININE 1.16 1.25 1.60* 1.61* 1.82* 1.73*  CALCIUM 9.3 9.2 9.2 9.3 9.3 9.7  MG 1.5 1.4* 1.6  --   --   --    Liver Function Tests:  Recent Labs Lab 10/07/12 0442 10/08/12 0640 10/09/12 0648  AST 10 11 11   ALT 5 <5 <5  ALKPHOS 168* 156* 160*  BILITOT 1.8* 1.4* 1.0  PROT 6.8 6.1 6.2  ALBUMIN 2.4* 2.3* 2.5*   CBC:  Recent Labs Lab 10/08/12 0812  WBC 6.4  NEUTROABS 4.6  HGB 10.0*  HCT 32.0*  MCV 81.2  PLT 111*   Cardiac Enzymes: No results found for this basename: CKTOTAL, CKMB, CKMBINDEX, TROPONINI,  in the last 168 hours BNP (last 3 results)  Recent Labs  05/20/12 0926 10/02/12 1702 10/04/12 1146  PROBNP 513.0* 662.0*  6763.0*   CBG:  Recent Labs Lab 10/10/12 2121 10/11/12 0735 10/11/12 0758 10/11/12 1108 10/11/12 1559  GLUCAP 143* 108* 91 115* 165*    Recent Results (from the past 240 hour(s))  URINE CULTURE     Status: None   Collection Time    10/04/12  2:36 PM      Result Value Range Status   Specimen Description URINE, CATHETERIZED   Final   Special Requests NONE   Final   Culture  Setup Time     Final   Value: 10/04/2012 16:21     Performed at [facility] Count     Final   Value: >=100,000 COLONIES/ML     Performed at [facility]   Culture     Final   Value: ENTEROBACTER CLOACAE     Performed at [facility]   Report Status 10/07/2012 FINAL   Final   Organism ID, Bacteria ENTEROBACTER CLOACAE   Final     Studies:  Recent x-ray studies have been reviewed in detail by the Attending Physician  Scheduled Meds:  Scheduled Meds: . arformoterol  15 mcg Nebulization BID  . diazepam  2.5 mg Intravenous Q6H  .  furosemide  120 mg Intravenous BID  . ipratropium  0.5 mg Nebulization BID  . LORazepam  1 mg Oral Once  . methylPREDNISolone (SOLU-MEDROL) injection  20 mg Intravenous Daily  . nystatin-triamcinolone ointment   Topical QID  . senna-docusate  1 tablet Oral QHS  . vitamin C  1,000 mg Oral Daily   Time spent on care of this patient:   Marlin Canary, DO  [facility] [phone] Pager   10/13/2012, 11:03 AM   LOS: 9 days

## 2012-10-13 NOTE — Progress Notes (Addendum)
Patient [id number] John [name].      DOB: [birth year]      VWU:[id number]   Palliative Medicine Team at [facility] Progress Note    Subjective: Patient remains with significant agitation. Trying to climb out of bed.  Spouse at the bedside.  Patient is able to communicate but can't get comfortable. He related that he felt like he needed to move his bowels again. Nursing reported a large bowel movement this a.m.    Filed Vitals:   10/13/12 0443  BP: 154/70  Pulse: 85  Temp: 97.3 F (36.3 C)  Resp: 22   Physical exam:  Gen. agitated moving his legs and turning in the bed. Pupils are equal round and reactive to light extraocular muscles appear to be intact mucous membranes are dry Chest is decreased with basilar crackles, distant breath sounds Cardiovascular regular rate and rhythm positive S1 and S2 no S3 or S4 Extremities multiple areas of ecchymosis on his skin, no edema Neurologically the patient's oriented to self he is able to communicate with me and is a little slower with his agitation.  Lab Results  Component Value Date   CREATININE 1.73* 10/11/2012   BUN 52* 10/11/2012   NA 138 10/11/2012   K 4.3 10/11/2012   CL 95* 10/11/2012   CO2 33* 10/11/2012         Assessment and plan: 72 year old white male with cold stage D. COPD admitted with exacerbation of CHF, urinary tract infection and sepsis. Patient experienced respiratory failure. He was started on a fentanyl drip for shortness of breath and his goals of care through his family are complete comfort with consideration of residential hospice when a bed is available. The patient was being treated with Valium, and Haldol for agitation but continues to have breakthrough periods of severe agitation. We trialed a dose of Thorazine this a.m. which appears to be starting to improve his behavior.   1. DO NOT RESUSCITATE DO NOT INTUBATE 2. COPD continue oxygen and fentanyl IV for treatment of air hunger 3. Loose bowel movements  continue to monitor may need to check for secondary infection if they persist. DC senna if loose stools persists  4. Dyspnea. Continue steroids and fentanyl drip 5. Vitamin C can be discontinued if family agrees 6. Agitation with possible delirium: Thorazine 12.5 mg is currently infusing will titrate dose if agitation persists.  Total time with family 35 minutes  Randolf Sansoucie L. Ladona Ridgel, MD MBA The Palliative Medicine Team at [facility] Phone: [phone] Pager: [phone]

## 2012-10-14 ENCOUNTER — Inpatient Hospital Stay (HOSPITAL_COMMUNITY): Payer: Medicare Other

## 2012-10-14 DIAGNOSIS — J9610 Chronic respiratory failure, unspecified whether with hypoxia or hypercapnia: Secondary | ICD-10-CM

## 2012-10-14 MED ORDER — FENTANYL BOLUS VIA INFUSION: 20.0000 ug | INTRAVENOUS | Status: AC | PRN

## 2012-10-14 MED ORDER — CAMPHOR-MENTHOL 0.5-0.5 % EX LOTN: TOPICAL_LOTION | CUTANEOUS | Status: AC | PRN

## 2012-10-14 MED ORDER — SODIUM CHLORIDE 0.9 % IV SOLN
25.0000 mg | Freq: Four times a day (QID) | INTRAVENOUS | Status: DC
  Filled 2012-10-14 (×3): qty 1

## 2012-10-14 MED ORDER — SODIUM CHLORIDE 0.9 % IJ SOLN: 10.0000 mL | INTRAMUSCULAR | Status: DC | PRN

## 2012-10-14 MED ORDER — DIAZEPAM 5 MG/ML IJ SOLN: 5.0000 mg | INTRAMUSCULAR | Status: AC | PRN

## 2012-10-14 MED ORDER — DIAZEPAM 5 MG/ML IJ SOLN: 2.5000 mg | Freq: Four times a day (QID) | INTRAMUSCULAR | Status: AC

## 2012-10-14 MED ORDER — BISACODYL 10 MG RE SUPP: 10.0000 mg | Freq: Every day | RECTAL | Status: AC | PRN

## 2012-10-14 MED ORDER — SODIUM CHLORIDE 0.9 % IV SOLN: 10.0000 ug/h | INTRAVENOUS | Status: AC

## 2012-10-14 MED ORDER — HEPARIN SOD (PORK) LOCK FLUSH 100 UNIT/ML IV SOLN
250.0000 [IU] | INTRAVENOUS | Status: AC | PRN
  Administered 2012-10-14: 500 [IU]

## 2012-10-14 MED ORDER — SODIUM CHLORIDE 0.9 % IV SOLN
25.0000 mg | Freq: Four times a day (QID) | INTRAVENOUS | Status: DC | PRN
  Filled 2012-10-14: qty 1

## 2012-10-14 MED ORDER — SODIUM CHLORIDE 0.9 % IV SOLN: 25.0000 mg | Freq: Four times a day (QID) | INTRAVENOUS | Status: AC | PRN

## 2012-10-14 NOTE — Discharge Summary (Signed)
Physician Discharge Summary  John [name]. BJY:[id number] DOB: [birth year] DOA: 10/04/2012  PCP: Hoyle Sauer, MD  Admit date: 10/04/2012 Discharge date: 10/14/2012  Time spent: 35 minutes  Recommendations for Outpatient Follow-up:  1. To inpatient hospice  Discharge Diagnoses:  Principal Problem:   Chronic diastolic heart failure Active Problems:   HYPERLIPIDEMIA-MIXED   CORONARY ATHEROSLERO UNSPEC TYPE BYPASS GRAFT   Campath-induced atrial fibrillation   COPD (chronic obstructive pulmonary disease) gold stage D.   DM (diabetes mellitus), type 2   Parkinson's disease   Acute respiratory failure   Pulmonary hypertension   Discharge Condition: stab;e  Diet recommendation:   Filed Weights   10/12/12 0729 10/13/12 0443 10/14/12 0557  Weight: 84.3 kg (185 lb 13.6 oz) 83.3 kg (183 lb 10.3 oz) 80.1 kg (176 lb 9.4 oz)    History of present illness:  John [name]. is a 71 y.o. 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  With regard to history, he had a long and complicated hospital admission in 1/11 when was admitted with pulm edema/NSTEMI. Stress test was abnormal so he went on to have cath showing severe disease involving the LM, LAD, ramus, CFX and RCA. He underwent CABG x 5. Post-op course was complicated by pulmonary edema requiring intubation, renal failure requiring CVVH, and atrial fibrillation. He was in the hospital about 7 weeks. In  11/11, patient had right SFA and popliteal atherectomy that was complicated by a left groin pseudoaneurysm. In the fall of 2012, John [name] reported worsening orthostatic-type lightheadedness and worsening exertional dyspnea. Dr. Shirlee Latch decided at that point to take him for right and left heart cath in 10/12. On Lasix, his right and left heart filling pressures were not significantly elevated. His bypass grafts were patent but he had an ungrafted PDA with 95% stenosis (small vessel that would not be amenable to PCI). As this was a possible source of ischemia and could potentially cause exertional dyspnea (his anginal equivalent was dyspnea in the past), Dr. Shirlee Latch started him on Imdur and took him off lisinopril due to worsening orthostatic symptoms. He has been on pyridostigmine for orthostatic hypotension with supine hypertension. In 3/13, John [name] had back surgery. Post-operative course was complicated by fluid retention and respiratory failure requiring intubation. He developed PNA and delirium. He had a prolonged hospitalization and rehabilitation. He has been having difficulty walking due to poor balance and right foot drop. He has seen neurology and was diagnosed with Parkinson's. He is wearing a brace which has helped with the foot drop. Last echo in 3/14 showed normal EF, moderate LVH, and moderate aortic stenosis.  More recently, the patient underwent R thoracentesis 09/15/12 for loculated pleural effusion. He was also recently completed a course of Augmentin and prednisone taper for dyspnea. Over the past 10 days, however, he has developed significant DOE, dry cough, LEE, and increase in abdominal girth. Decreased UOP per wife - pt hasn't noticed. He was evaluated in the pulmonary office 2 days ago at which time Lasix was increased to 80mg  BID. LE dopplers  were negative for DVT. He saw Dr. Abel Presto yesterday and K was discontinued due to level 5.7, prednisone was decreased from 10->5mg , and Lasix was  increased to 120mg  BID. He took 2 doses of that yesterday and 1 dose this AM, but was still significantly SOB so his wife drove him to the ER. He was hypoxic at 87% on [place] thus was placed on bipap. He has been intermittently bradycardic down to the mid 30's but is currently maintaining 40s-60s. He is in [place]. At times when dropping slower he has a 2:1 pattern but per discussion with Dr. Graciela Husbands with EP, this is likely [place] as well given surrounding rhythm. He is still mentating well and BP is stable. Please note in his chart the vitals show HR in 120's but this is an error. He has not been tachycardic this admission. pBNP 6763, troponin neg x 1, BUN/Cr 44/1.39, K 5.1, Hgb 10.7/plt 126. CXR shows diffuse slightly asymmetric air space disease similar to the recent examination, question pulmonary edema versus infectious infiltrate, cardiomegaly post CABG, calcified aorta. (CXR on 8/13 was read as moderate layering/loculated right pleural effusion.) He denies any fever, chills, orthopnea, CP, palpitations, or syncope. Difficult to weigh regularly at home due to balance, but weight is stable today compared to last in 08/2012 and down from May/June. He received 80mg  IV Lasix and atrovent/proventil neb in the ER and is beginning to have good UOP. Patient seen by Willa Rough, MD,([facility]) in the ED. Patient initially was admitted to 4 E. rapid response team was called when patient into respiratory distress and patient was transferred to 2900. Currently patient still feeling short of breath while on BiPAP, hypertensive, respiration rate in the high 20s -30s. 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   COPD   DM type 2   Parkinson's disease   CAD w CABG 2011  Pulmonary hypertension  Recurrent loculated R pleural effusion    HYPERLIPIDEMIA  D/c with PICC line    Consultations:  Cards  Renal  palliative care  Discharge Exam: Filed Vitals:   10/14/12 0557  BP: 149/82  Pulse: 87  Temp: 97.6 F (36.4 C)  Resp: 22    General: resting Cardiovascular: reg Respiratory: clear  Discharge Instructions      Discharge Orders   Future Appointments Provider Department Dept Phone   11/18/2012 2:00 PM Laurey Morale, MD [facility] Main Office [place]) [phone]   11/19/2012 2:15 PM Storm Frisk, MD [facility] [phone]   01/01/2013 2:00 PM Melvyn Novas, MD [facility] [phone]   Future Orders Complete By Expires   Discharge instructions  As directed    Comments:     Cardiology recs: torsemide 100 mg daily if able to take       Medication List    STOP taking these medications       aspirin 81 MG tablet     atorvastatin 40 MG tablet  Commonly known as:  LIPITOR     calcium carbonate 1250 MG tablet  Commonly known as:  OS-CAL - dosed in mg of elemental calcium     carbidopa-levodopa 50-200 MG per tablet  Commonly known as:  SINEMET CR     CELLCEPT 500 MG tablet  Generic drug:  mycophenolate     clopidogrel 75 MG tablet  Commonly known as:  PLAVIX     Compressor/Nebulizer Misc     eszopiclone 2 MG Tabs tablet  Commonly known as:  LUNESTA     ezetimibe 10 MG tablet  Commonly known as:  ZETIA     FLOMAX 0.4 MG Caps capsule  Generic drug:  tamsulosin     folic acid 400 MCG tablet  Commonly known as:  FOLVITE     furosemide 40 MG tablet  Commonly known as:  LASIX     insulin aspart 100 UNIT/ML injection  Commonly known as:   novoLOG     Iron 325 (65 FE) MG Tabs     isosorbide mononitrate 60 MG 24 hr tablet  Commonly known as:  IMDUR     LAMICTAL 200 MG tablet  Generic drug:  lamoTRIgine     multivitamin with minerals Tabs tablet     pantoprazole 40 MG tablet  Commonly known as:  PROTONIX     predniSONE 5 MG tablet  Commonly known as:  DELTASONE     pyridostigmine 60 MG tablet  Commonly known as:  MESTINON     tacrolimus 5 MG capsule  Commonly known as:  PROGRAF     traMADol 50 MG tablet  Commonly known as:  ULTRAM     Umeclidinium-Vilanterol 62.5-25 MCG/INH Aepb     VIIBRYD 40 MG Tabs  Generic drug:  Vilazodone HCl     vitamin C 500 MG tablet  Commonly known as:  ASCORBIC ACID     Vitamin D-3 5000 UNITS Tabs      TAKE these medications       albuterol (2.5 MG/3ML) 0.083% nebulizer solution  Commonly known as:  PROVENTIL  Take 2.5 mg by nebulization every 6 (six) hours as needed for wheezing.     bisacodyl 10 MG suppository  Commonly known as:  DULCOLAX  Place 1 suppository (10 mg total) rectally daily as needed.     camphor-menthol lotion  Commonly known as:  SARNA  Apply topically as needed for itching.     diazepam 5 MG/ML injection  Commonly known as:  VALIUM  Inject 0.5 mLs (2.5 mg total) into the vein every 6 (six) hours.     diazepam 5 MG/ML injection  Commonly known as:  VALIUM  Inject 1 mL (5 mg total) into the vein every 4 (four) hours as needed.     fentaNYL Soln  Commonly known as:  SUBLIMAZE  Inject 20 mcg into the vein every 30 (thirty) minutes as needed (DYSPNEA, DISTRESS, AGITATION).     sodium chloride 0.9 % SOLN 200 mL with fentaNYL 0.05 MG/ML SOLN 2,500 mcg  Inject 10 mcg/hr into the vein continuous.     sodium chloride 0.9 % SOLN 25 mL with chlorproMAZINE 25 MG/ML SOLN 25 mg  Inject 25 mg into the vein every 6 (six) hours as needed (agitation).       No Known Allergies    The results of significant diagnostics from this hospitalization  (including imaging, microbiology, ancillary and laboratory) are listed below for reference.    Significant Diagnostic Studies: Dg Chest 2 View  10/02/2012   *  RADIOLOGY REPORT*  Clinical Data: Cough, shortness of breath  CHEST - 2 VIEW  Comparison: 09/20/2012  Findings: Hazy opacity overlying the right mid/lower lung likely reflects a moderate layering/loculated right pleural effusion.  Suspected small left pleural effusion/pleural thickening.  These findings are unchanged.  Cardiomegaly. Postsurgical changes related to prior CABG.  Visualized osseous structures are within normal limits.  IMPRESSION: Moderate layering/loculated right pleural effusion.  Small left pleural effusion/pleural thickening.  When compared the prior study, these findings are unchanged.   Original Report Authenticated By: Charline Bills, M.D.   Dg Chest 2 View  09/20/2012   *RADIOLOGY REPORT*  Clinical Data: Cough.  Dyspnea on exertion.  CHEST - 2 VIEW  Comparison: 07/24/2014and CT chest from 05/30/2011 and chest x-ray from 05/10/2011.  Findings: Two-view exam shows cardiomegaly with underlying diffuse chronic interstitial coarsening.  Vascular congestion noted.  Pleural thickening versus posterior loculated pleural fluid noted on the right.  Less prominent pleural thickening/fluid is noted on the left. There is bibasilar atelectasis or infiltrate  IMPRESSION: Two-view chest x-ray is essentially unchanged from the 05/10/2011 exam.  Loculated posterior pleural fluid with overlying lower lobe collapse / consolidation.   Original Report Authenticated By: Kennith Center, M.D.   Ct Head Wo Contrast  10/08/2012   *RADIOLOGY REPORT*  Clinical Data: Altered mental status.  CT HEAD WITHOUT CONTRAST  Technique:  Contiguous axial images were obtained from the base of the skull through the vertex without contrast.  Comparison: Head CT scan 03/11/2009.  Findings: There is some cortical atrophy and chronic microvascular ischemic change.  No  evidence of acute abnormality including infarct, hemorrhage, mass lesion, mass effect, midline shift or abnormal extra-axial fluid collection is identified.  The calvarium is intact.  No hydrocephalus or pneumocephalus.  IMPRESSION: No acute finding.  Stable compared to prior exam.   Original Report Authenticated By: Holley Dexter, M.D.   Dg Chest Port 1 View  10/10/2012   *RADIOLOGY REPORT*  Clinical Data: Congestive heart failure, dyspnea, shortness of breath  PORTABLE CHEST - 1 VIEW  Comparison: 10/04/2012  Findings: Cardiomegaly again noted.  Status post CABG.  Stable vascular congestion and hazy perihilar and lower lobe airspace disease consistent with pulmonary edema.  Probable bilateral small pleural effusion with bilateral basilar atelectasis or infiltrate.  IMPRESSION: Stable vascular congestion and hazy perihilar and lower lobe airspace disease consistent with pulmonary edema.  Probable bilateral small pleural effusion with bilateral basilar atelectasis or infiltrate.   Original Report Authenticated By: Natasha Mead, M.D.   Dg Chest Port 1 View  10/07/2012   *RADIOLOGY REPORT*  Clinical Data: Follow up pulmonary edema  PORTABLE CHEST - 1 VIEW  Comparison: 10/06/2012  Findings: Previous median sternotomy CABG procedure.  There is moderate cardiac enlargement noted. Bilateral pleural effusions and pulmonary edema is unchanged compared with previous exam.  IMPRESSION:  1.  No change in CHF.   Original Report Authenticated By: Signa Kell, M.D.   Dg Chest Port 1 View  10/06/2012   *RADIOLOGY REPORT*  Clinical Data: Edema.  PORTABLE CHEST - 1 VIEW  Comparison: 10/05/2012  Findings: Patchy airspace densities, right side greater than left. Findings could represent asymmetric pulmonary edema and/or infection.  Heart size is grossly stable.  No evidence for pneumothorax.  IMPRESSION: Minimal change in the bilateral airspace disease, right side greater than left.   Original Report Authenticated By: Richarda Overlie, M.D.   Dg Chest Port 1 View  10/05/2012   *RADIOLOGY REPORT*  Clinical Data: Pulmonary edema.  Evaluate  for respiratory status and lung status.  PORTABLE CHEST - 1 VIEW  Comparison: 10/04/2012.  Findings: Patchy bilateral airspace lung opacities are similar to the previous day's exam.  There is additional opacity at the right lung base consistent with atelectasis and a small effusion.  This is also stable.  There is no pneumothorax.  Changes from CABG surgery are stable.  The cardiac silhouette is normal in size.  IMPRESSION: Persistent bilateral airspace lung opacities with additional right lung base pleural fluid and atelectasis.  There is been no change from prior exam. The bilateral lung opacities are consistent with pulmonary edema.   Original Report Authenticated By: Amie Portland, M.D.   Dg Chest Port 1 View  10/04/2012   *RADIOLOGY REPORT*  Clinical Data: Shortness of breath.  PORTABLE CHEST - 1 VIEW  Comparison: 10/02/2012.  Findings: Diffuse slightly asymmetric air space disease similar to the recent examination.  Question pulmonary edema versus infectious infiltrate.  Cardiomegaly post CABG.  Calcified aorta.  IMPRESSION: Diffuse slightly asymmetric air space disease similar to the recent examination.  Question pulmonary edema versus infectious infiltrate. Recommend follow-up until clearance.  Cardiomegaly post CABG.  Calcified aorta.   Original Report Authenticated By: Lacy Duverney, M.D.    Microbiology: Recent Results (from the past 240 hour(s))  URINE CULTURE     Status: None   Collection Time    10/04/12  2:36 PM      Result Value Range Status   Specimen Description URINE, CATHETERIZED   Final   Special Requests NONE   Final   Culture  Setup Time     Final   Value: 10/04/2012 16:21     Performed at [facility] Count     Final   Value: >=100,000 COLONIES/ML     Performed at [facility]   Culture     Final   Value: ENTEROBACTER CLOACAE      Performed at [facility]   Report Status 10/07/2012 FINAL   Final   Organism ID, Bacteria ENTEROBACTER CLOACAE   Final     Labs: Basic Metabolic Panel:  Recent Labs Lab 10/08/12 0640 10/09/12 0648 10/09/12 0947 10/10/12 0500 10/11/12 0500  NA 141 140 139 140 138  K 3.8 4.0 4.0 4.0 4.3  CL 100 98 97 98 95*  CO2 34* 33* 31 33* 33*  GLUCOSE 102* 145* 153* 141* 124*  BUN 43* 50* 50* 52* 52*  CREATININE 1.25 1.60* 1.61* 1.82* 1.73*  CALCIUM 9.2 9.2 9.3 9.3 9.7  MG 1.4* 1.6  --   --   --    Liver Function Tests:  Recent Labs Lab 10/08/12 0640 10/09/12 0648  AST 11 11  ALT <5 <5  ALKPHOS 156* 160*  BILITOT 1.4* 1.0  PROT 6.1 6.2  ALBUMIN 2.3* 2.5*   No results found for this basename: LIPASE, AMYLASE,  in the last 168 hours No results found for this basename: AMMONIA,  in the last 168 hours CBC:  Recent Labs Lab 10/08/12 0812  WBC 6.4  NEUTROABS 4.6  HGB 10.0*  HCT 32.0*  MCV 81.2  PLT 111*   Cardiac Enzymes: No results found for this basename: CKTOTAL, CKMB, CKMBINDEX, TROPONINI,  in the last 168 hours BNP: BNP (last 3 results)  Recent Labs  05/20/12 0926 10/02/12 1702 10/04/12 1146  PROBNP 513.0* 662.0* 6763.0*   CBG:  Recent Labs Lab 10/10/12 2121 10/11/12 0735 10/11/12 0758 10/11/12 1108 10/11/12 1559  GLUCAP 143* 108* 91  115* 165*       Signed:  Shaddai Shapley  [facility] 10/14/2012, 9:27 AM

## 2012-10-14 NOTE — Progress Notes (Signed)
Pt's wife made aware that ambulance will  Be here to pick pt up before 3.00pm.  Verbalized understanding.  Report called to Herbert Seta, nurse at [facility] place.  Verbalized understanding.  Clarified with Dr. Benjamine Mola that fentanyl iv will be started by picc on arrival at Nursing home.  Fentanyl continue infusing until ambulance here to pick pt up.  Amanda Pea, Charity fundraiser.

## 2012-10-14 NOTE — Progress Notes (Signed)
Peripherally Inserted Central Catheter/Midline Placement  The IV Nurse has discussed with the patient and/or persons authorized to consent for the patient, the purpose of this procedure and the potential benefits and risks involved with this procedure.  The benefits include less needle sticks, lab draws from the catheter and patient may be discharged home with the catheter.  Risks include, but not limited to, infection, bleeding, blood clot (thrombus formation), and puncture of an artery; nerve damage and irregular heat beat.  Alternatives to this procedure were also discussed.  PICC/Midline Placement Documentation     Wife signed consent   Vevelyn Pat 10/14/2012, 12:10 PM

## 2012-10-14 NOTE — Progress Notes (Signed)
Patient ID: John Novel., male   DOB: 1940/08/24, 72 y.o.   MRN: [id number]    SUBJECTIVE: Stable this morning, had hard day yesterday with agitation, etc.  Sleeping currently.  Wife at bedside.  I/Os negative, weight is down.  He is now comfort measures only.   Marland Kitchen arformoterol  15 mcg Nebulization BID  . diazepam  2.5 mg Intravenous Q6H  . furosemide  120 mg Intravenous BID  . ipratropium  0.5 mg Nebulization BID  . LORazepam  1 mg Oral Once  . methylPREDNISolone (SOLU-MEDROL) injection  20 mg Intravenous Daily  . nystatin-triamcinolone ointment   Topical QID  . senna-docusate  1 tablet Oral QHS  . vitamin C  1,000 mg Oral Daily      Filed Vitals:   10/13/12 0912 10/13/12 1333 10/13/12 2040 10/14/12 0557  BP:  144/76 155/93 149/82  Pulse:  86 74 87  Temp:  98.6 F (37 C) 97.9 F (36.6 C) 97.6 F (36.4 C)  TempSrc:  Oral Oral Oral  Resp:  22 21 22   Height:      Weight:    80.1 kg (176 lb 9.4 oz)  SpO2: 95% 92% 100% 100%    Intake/Output Summary (Last 24 hours) at 10/14/12 0847 Last data filed at 10/14/12 0557  Gross per 24 hour  Intake 540.22 ml  Output   3075 ml  Net -2534.78 ml    LABS: Basic Metabolic Panel: No results found for this basename: NA, K, CL, CO2, GLUCOSE, BUN, CREATININE, CALCIUM, MG, PHOS,  in the last 72 hours Liver Function Tests: No results found for this basename: AST, ALT, ALKPHOS, BILITOT, PROT, ALBUMIN,  in the last 72 hours No results found for this basename: LIPASE, AMYLASE,  in the last 72 hours CBC: No results found for this basename: WBC, NEUTROABS, HGB, HCT, MCV, PLT,  in the last 72 hours Cardiac Enzymes: No results found for this basename: CKTOTAL, CKMB, CKMBINDEX, TROPONINI,  in the last 72 hours BNP: No components found with this basename: POCBNP,  D-Dimer: No results found for this basename: DDIMER,  in the last 72 hours Hemoglobin A1C: No results found for this basename: HGBA1C,  in the last 72 hours Fasting Lipid  Panel: No results found for this basename: CHOL, HDL, LDLCALC, TRIG, CHOLHDL, LDLDIRECT,  in the last 72 hours Thyroid Function Tests: No results found for this basename: TSH, T4TOTAL, FREET3, T3FREE, THYROIDAB,  in the last 72 hours Anemia Panel: No results found for this basename: VITAMINB12, FOLATE, FERRITIN, TIBC, IRON, RETICCTPCT,  in the last 72 hours  RADIOLOGY: Dg Chest Port 1 View  10/06/2012   *RADIOLOGY REPORT*  Clinical Data: Edema.  PORTABLE CHEST - 1 VIEW  Comparison: 10/05/2012  Findings: Patchy airspace densities, right side greater than left. Findings could represent asymmetric pulmonary edema and/or infection.  Heart size is grossly stable.  No evidence for pneumothorax.  IMPRESSION: Minimal change in the bilateral airspace disease, right side greater than left.   Original Report Authenticated By: Richarda Overlie, M.D.   Dg Chest Port 1 View  10/05/2012   *RADIOLOGY REPORT*  Clinical Data: Pulmonary edema.  Evaluate for respiratory status and lung status.  PORTABLE CHEST - 1 VIEW  Comparison: 10/04/2012.  Findings: Patchy bilateral airspace lung opacities are similar to the previous day's exam.  There is additional opacity at the right lung base consistent with atelectasis and a small effusion.  This is also stable.  There is no pneumothorax.  Changes from  CABG surgery are stable.  The cardiac silhouette is normal in size.  IMPRESSION: Persistent bilateral airspace lung opacities with additional right lung base pleural fluid and atelectasis.  There is been no change from prior exam. The bilateral lung opacities are consistent with pulmonary edema.   Original Report Authenticated By: Amie Portland, M.D.   Dg Chest Port 1 View  10/04/2012   *RADIOLOGY REPORT*  Clinical Data: Shortness of breath.  PORTABLE CHEST - 1 VIEW  Comparison: 10/02/2012.  Findings: Diffuse slightly asymmetric air space disease similar to the recent examination.  Question pulmonary edema versus infectious infiltrate.   Cardiomegaly post CABG.  Calcified aorta.  IMPRESSION: Diffuse slightly asymmetric air space disease similar to the recent examination.  Question pulmonary edema versus infectious infiltrate. Recommend follow-up until clearance.  Cardiomegaly post CABG.  Calcified aorta.   Original Report Authenticated By: Lacy Duverney, M.D.   US Thoracentesis Asp Pleural Space W/img Guide  09/15/2012   *RADIOLOGY REPORT*  Clinical Data:  Patient with history of prior CABG, COPD, right pleural effusion, dyspnea.  Request is made for therapeutic right thoracentesis.  ULTRASOUND GUIDED  THERAPEUTIC RIGHT THORACENTESIS  An ultrasound guided thoracentesis was thoroughly discussed with the patient and questions answered.  The benefits, risks, alternatives and complications were also discussed.  The patient understands and wishes to proceed with the procedure.  Written consent was obtained.  Ultrasound was performed to localize and mark an adequate pocket of fluid in the right chest.  The area was then prepped and draped in the normal sterile fashion.  1% Lidocaine was used for local anesthesia.  Under ultrasound guidance a 19 gauge John [name] catheter was introduced.  Thoracentesis was performed.  The catheter was removed and a dressing applied.  Complications:  none  Findings: A total of approximately 150 cc's of blood tinged serous fluid was removed. Due to the multiloculated nature of the collection and despite catheter manipulation, only the above amount of fluid could be aspirated at this time.  IMPRESSION: Successful ultrasound guided therapeutic right thoracentesis yielding 150 cc's of pleural fluid. Due to the multiloculated nature of the collection and small amount of fluid removed follow- up CT chest may be helpful for further characterization.  Read by: John [name], P.A.-C   Original Report Authenticated By: Irish Lack, M.D.    PHYSICAL EXAM General: NAD, sleeping Neck: JVP 10 cm, no thyromegaly or thyroid nodule.    Lungs: Decreased breath sounds at bases bilaterally.  CV: Nondisplaced PMI.  Heart regular S1/S2, no S3/S4, 3/6 SEM RUSB.  Trace ankle edema.  No carotid bruit.   Abdomen: Soft, nontender, no hepatosplenomegaly, no distention.  Neurologic: Sleeping  Extremities: No clubbing or cyanosis.   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 1. 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  Weight is down with diuretics, I/Os net negative.  Continue diuresis for comfort.  When he goes to [facility], would transition to torsemide 100 mg daily.  Continue IV Lasix while in the hospital. 2. COPD: He is on home oxygen at baseline.  Getting nebulized bronchodilators. 3. CKD: Comfort measures so not drawing labs and off anti-rejection meds. 4. 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 His  HR is fine currently.  Agree with stopping pyridostigmine as he was bradycardic at admission.  5. UTI: Enterobacter in urine, Cipro and Bactrim sensitive.  Treated with Bactrim x 3 days then stopped with rising creatinine. 6. CAD: s/p CABG.  Stable, ASA/Plavix/atorvastatin stopped due to comfort measures. 7. Disposition: Plan discharge to [facility]/inpatient hospice when bed available.   [name] 10/14/2012 8:47 AM

## 2012-10-14 NOTE — Progress Notes (Signed)
PICC line capped off, flushed with 10cc NS, GBR followed by Heparin 250 units in each port. Consuello Masse

## 2012-10-16 ENCOUNTER — Ambulatory Visit: Payer: Medicare Other | Admitting: Cardiology

## 2012-10-16 NOTE — Clinical Social Work Placement (Addendum)
    Clinical Social Work Department CLINICAL SOCIAL WORK PLACEMENT NOTE 10/16/2012  Patient:  John [name], John [name]  Account Number:  [id number] Admit date:  10/04/2012  Clinical Social Worker:  Hulan Fray  Date/time:  10/09/2012 04:01 PM  Clinical Social Work is seeking post-discharge placement for this patient at the following level of care:   SKILLED NURSING   (*CSW will update this form in Epic as items are completed)   10/09/2012  Patient/family provided with [facility] Department of Clinical Social Work's list of facilities offering this level of care within the geographic area requested by the patient (or if unable, by the patient's family).  10/09/2012  Patient/family informed of their freedom to choose among providers that offer the needed level of care, that participate in Medicare, Medicaid or managed care program needed by the patient, have an available bed and are willing to accept the patient.  10/09/2012  Patient/family informed of [facility]' ownership interest in [facility], as well as of the fact that they are under no obligation to receive care at this facility.  PASARR submitted to [employer] on 05/19/2011 PASARR number received from [employer] on 05/19/2011  FL2 transmitted to all facilities in geographic area requested by pt/family on  10/09/2012 FL2 transmitted to all facilities within larger geographic area on   Patient informed that his/her managed care company has contracts with or will negotiate with  certain facilities, including the following:   Referred to [facility] 10/11/12     Patient/family informed of bed offers received:  10/14/2012 Patient chooses bed at  Physician recommends and patient chooses bed at    Patient to be transferred to  on  10/14/2012 Patient to be transferred to facility by ambulance  The following physician request were entered in Epic:   Additional Comments: DC to [facility] 10/14/12 via EMS.  Ok per patient's  wife- support provided.  Patient was poorly responsive and did not appear to be aware of d/c plan. Nursing notified and will give report.  CSW discussed with Forrestine Him, LCSW- [facility].

## 2012-10-23 ENCOUNTER — Telehealth: Payer: Self-pay | Admitting: Critical Care Medicine

## 2012-10-23 NOTE — Progress Notes (Signed)
Quick Note:  Pt is expired. ______

## 2012-10-23 NOTE — Telephone Encounter (Signed)
Thanks for letting me know!

## 2012-10-23 NOTE — Telephone Encounter (Signed)
Will forward to Dr.Wright so he is aware 

## 2012-11-18 ENCOUNTER — Ambulatory Visit: Payer: Medicare Other | Admitting: Cardiology

## 2012-11-19 ENCOUNTER — Ambulatory Visit: Payer: Medicare Other | Admitting: Critical Care Medicine

## 2012-11-20 DEATH — deceased

## 2013-01-01 ENCOUNTER — Ambulatory Visit: Payer: Medicare Other | Admitting: Neurology

## 2013-05-01 NOTE — Telephone Encounter (Signed)
No additional info °

## 2013-06-05 IMAGING — CR DG CHEST 1V PORT
1 series · 1 of 1 positions shown · non-contrast
Comparison: 05/18/2011

CLINICAL DATA: Hypoxia.  Pulmonary edema.  Coronary artery disease.
Postop from back surgery.

PORTABLE CHEST - 1 VIEW

[view not recorded]
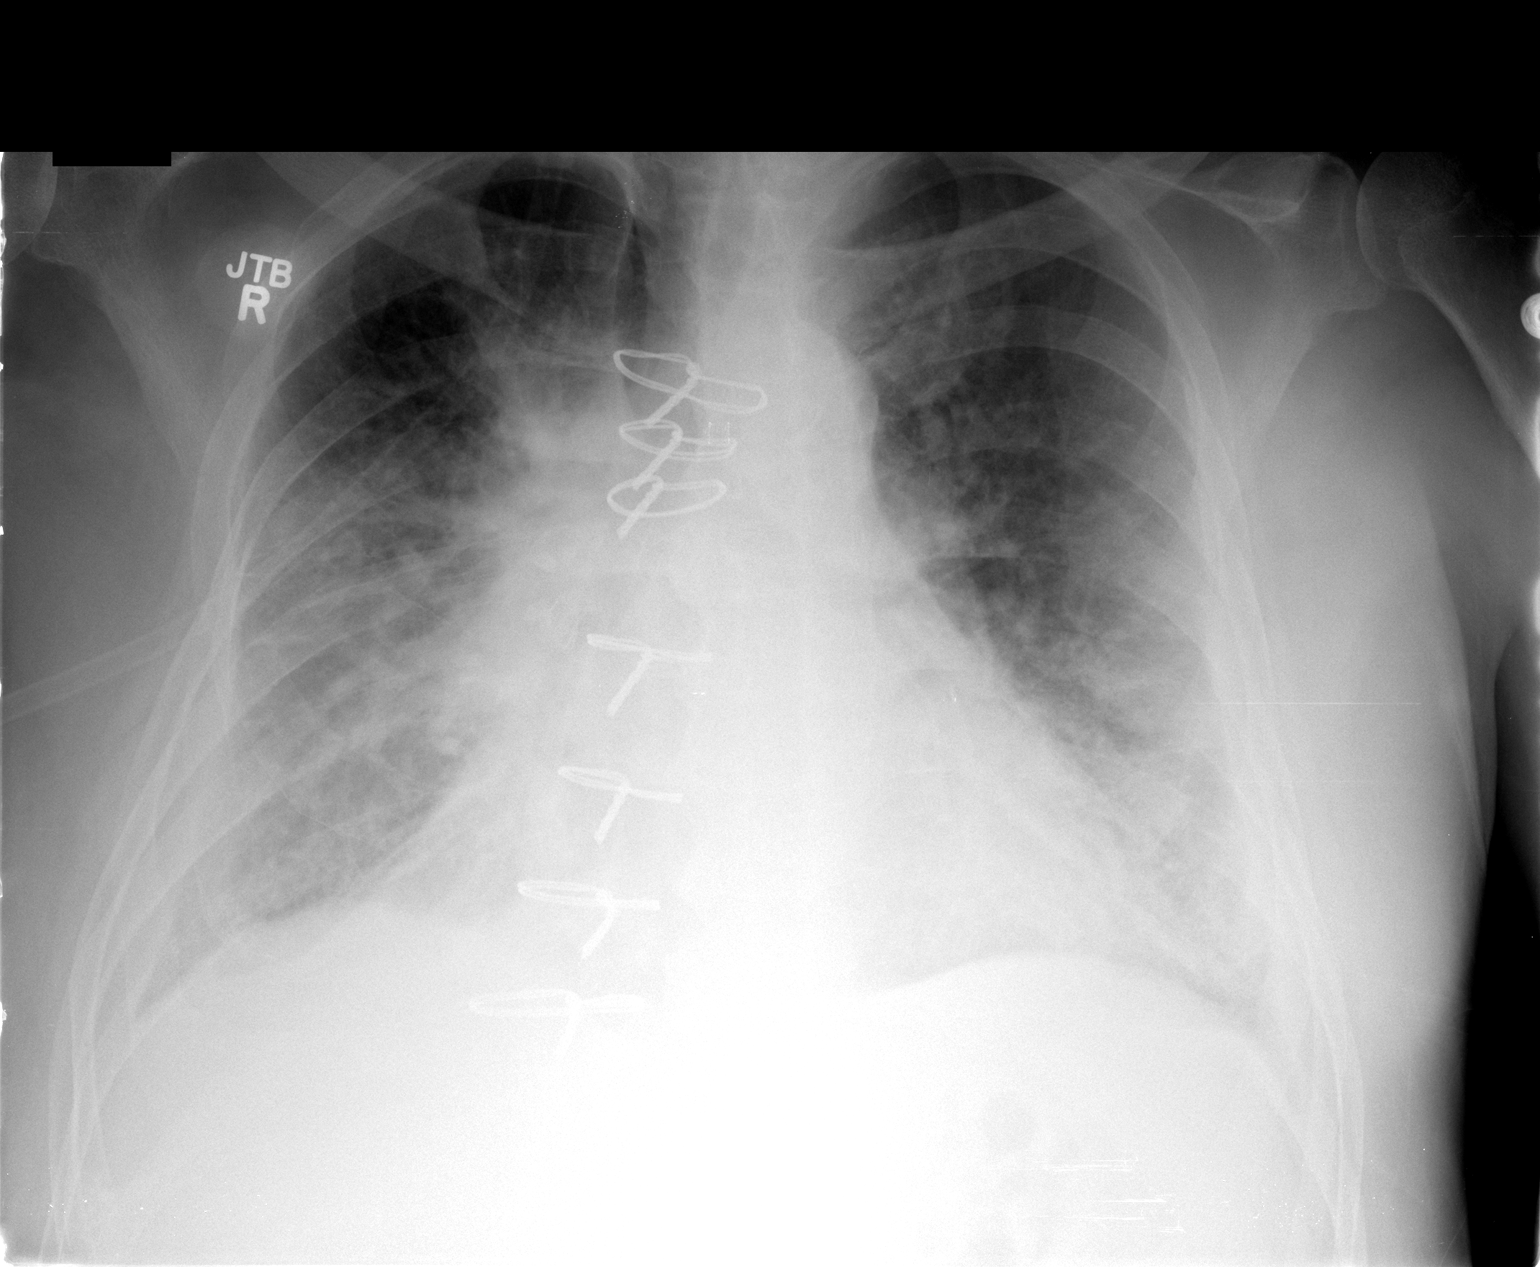

[1 of 1 positions shown; findings below may reference images not displayed]

FINDINGS: Cardiomegaly stable.  Improved aeration of both lungs is
seen.  Pulmonary interstitial edema pattern is not simply changed
allowing for increased lung volumes.  Probable small bilateral
pleural effusions are noted.
IMPRESSION: 1.  Improved aeration of both lungs.
2.  No significant change in diffuse interstitial edema and
cardiomegaly.  Probable small bilateral pleural effusions.

## 2013-06-07 IMAGING — CR DG CHEST 2V
3 series · 3 of 3 positions shown · non-contrast
Comparison: the previous day's study

CLINICAL DATA: Shortness of breath

CHEST - 2 VIEW

[w chest lat (1 of 2)]
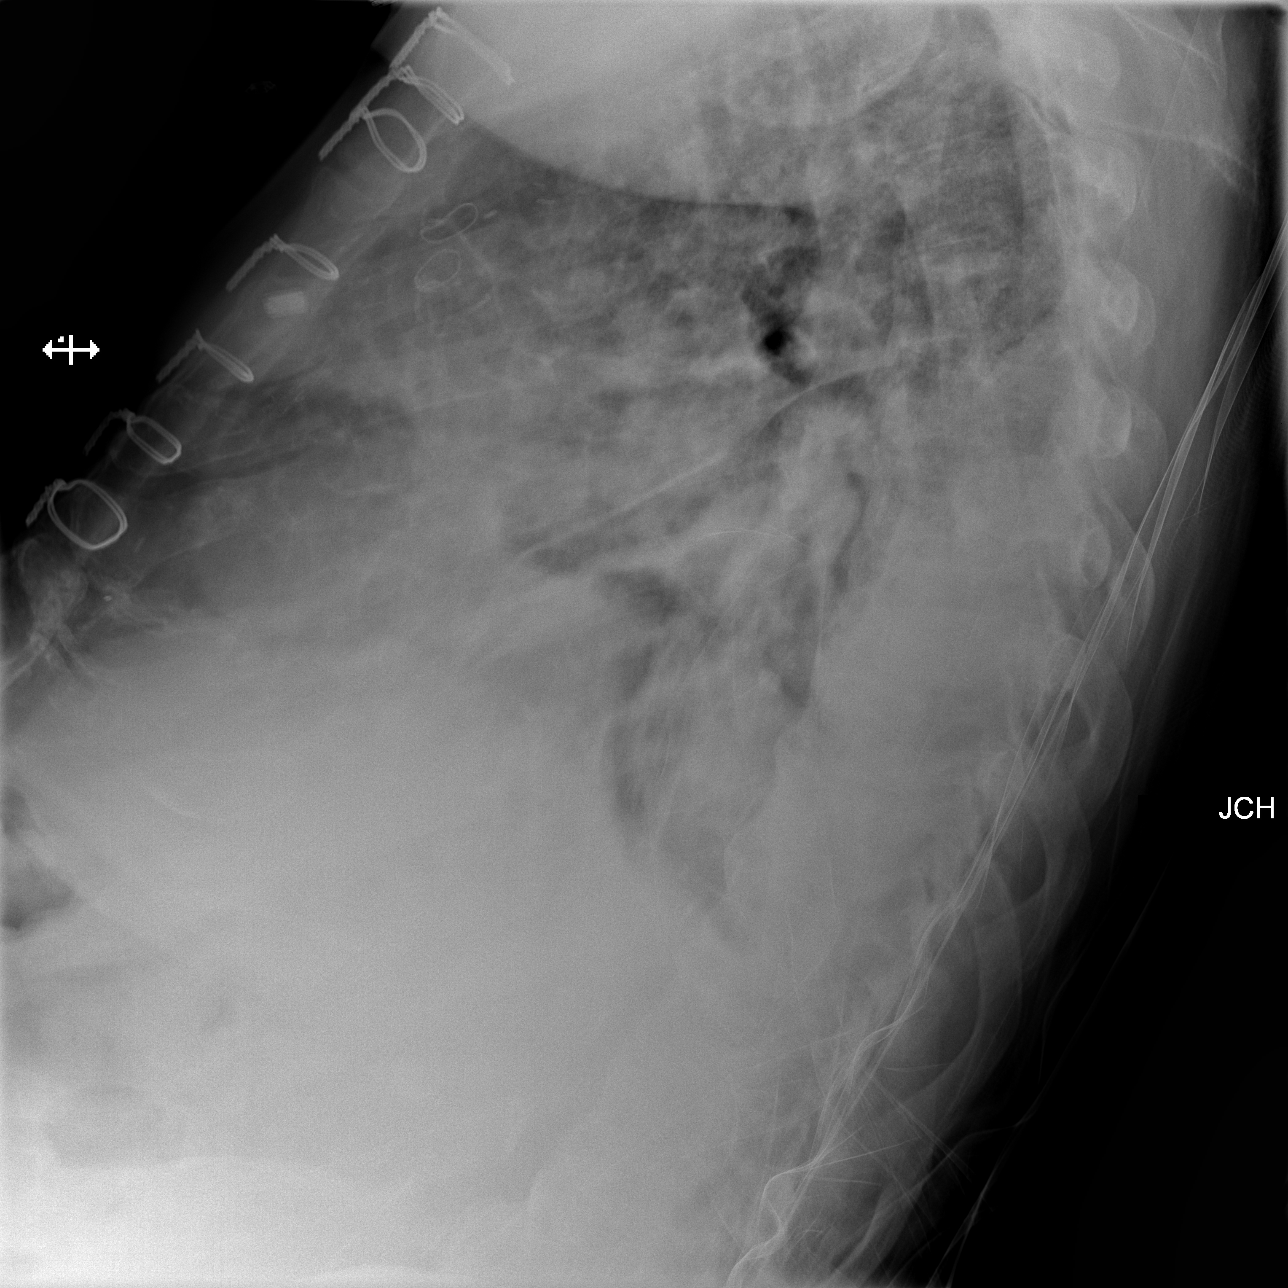

[w chest lat (2 of 2)]
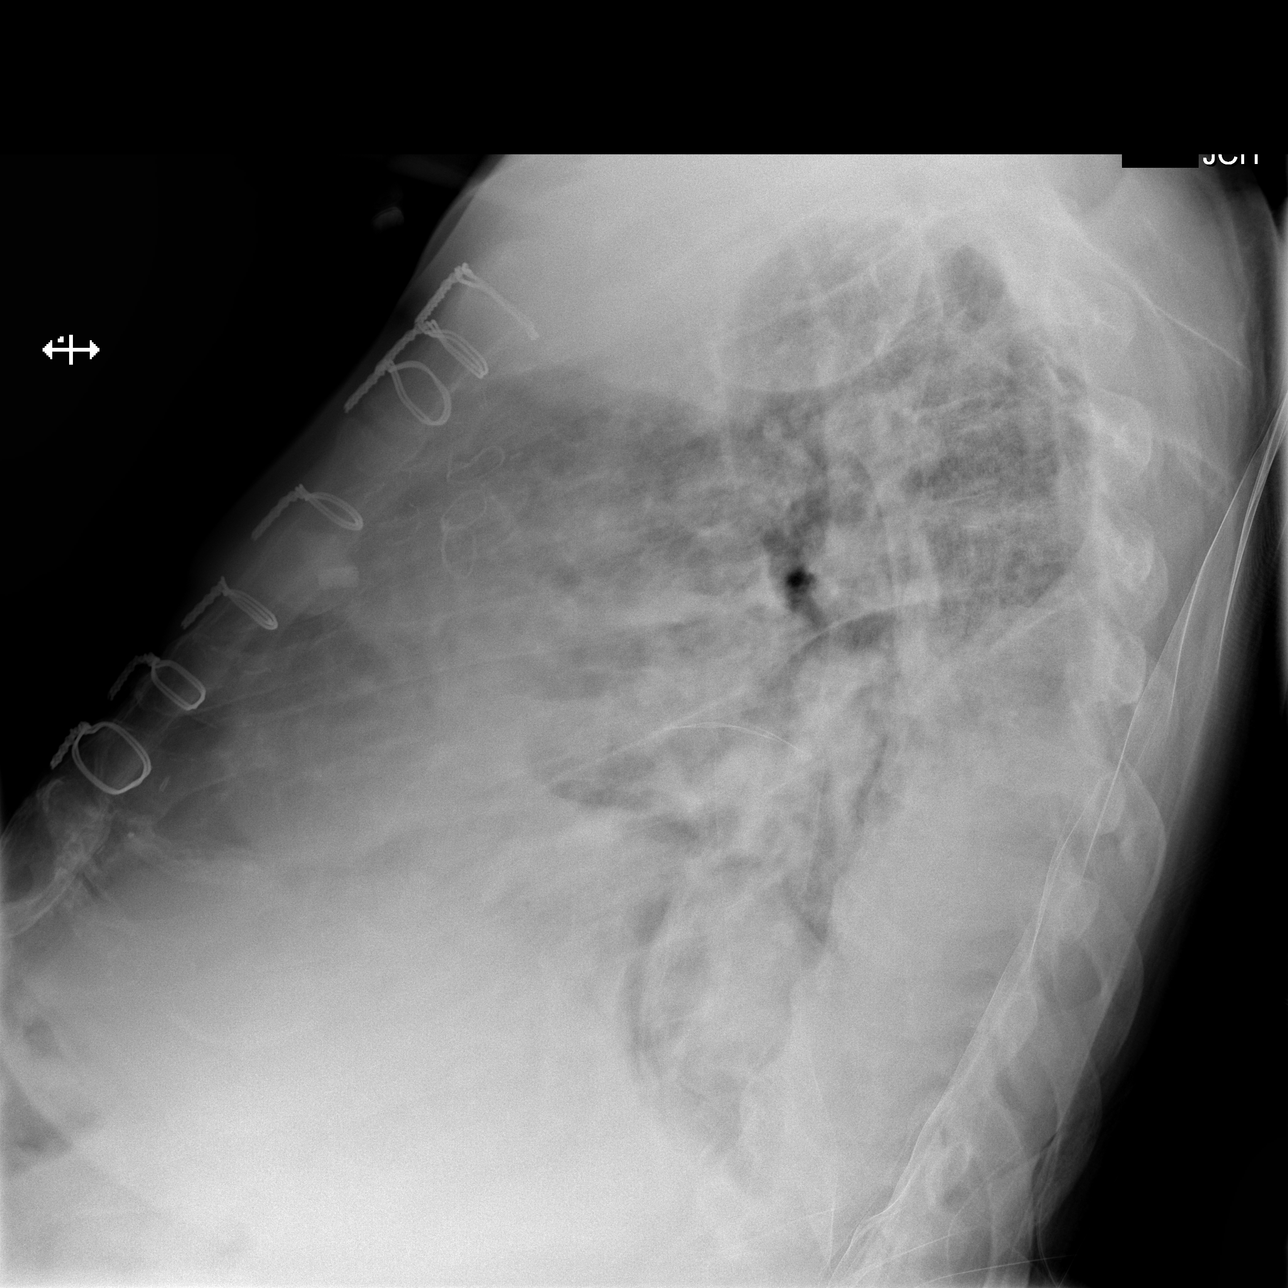

[x chest ap]
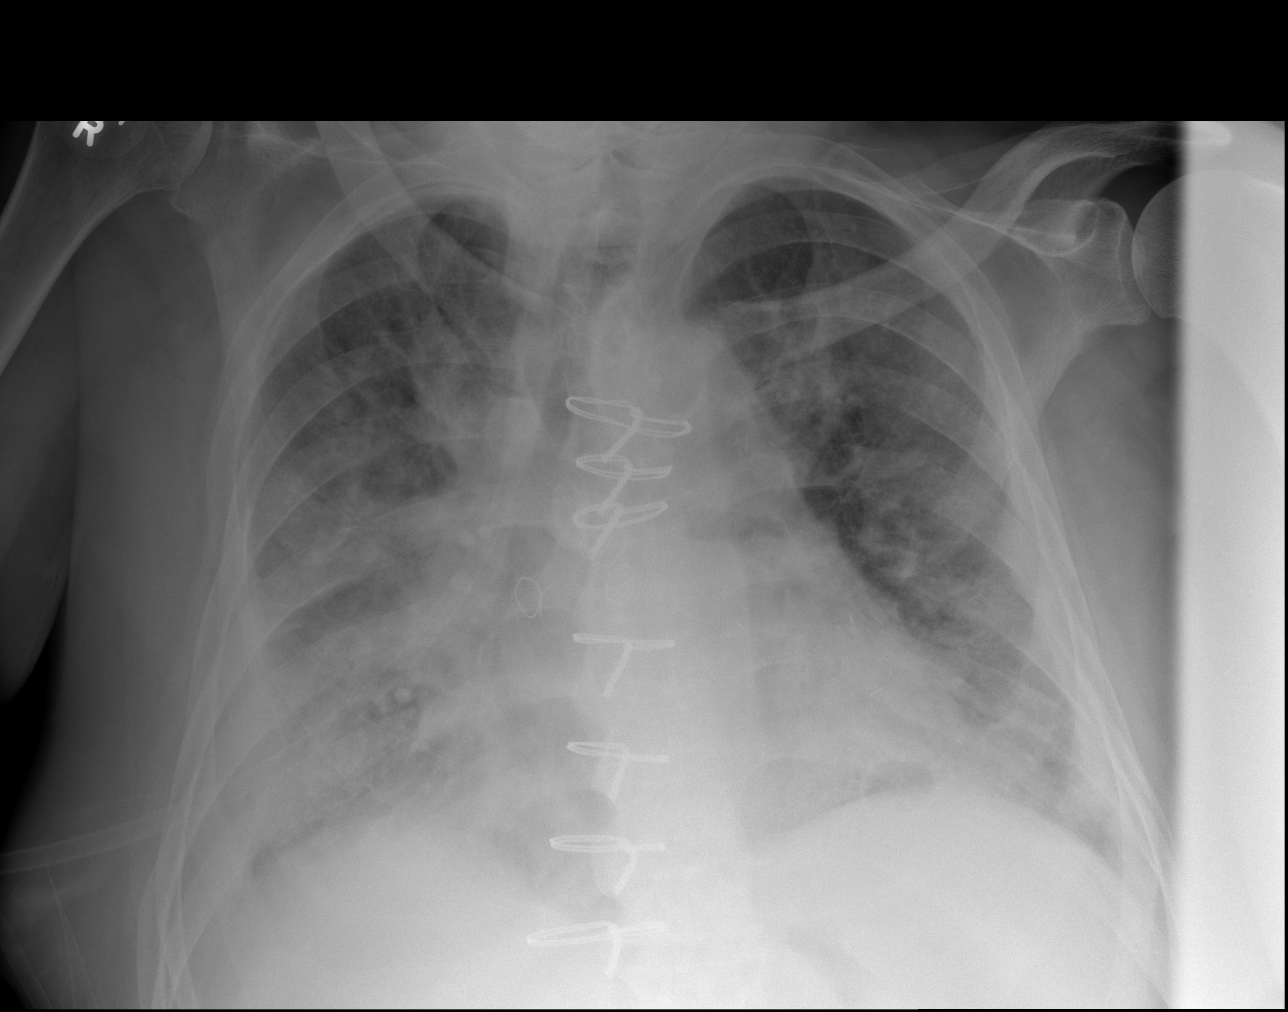

[3 of 3 positions shown; findings below may reference images not displayed]

FINDINGS: Previous CABG.  Moderate diffuse airspace opacities,
relatively improved in the lung bases since previous exam. Right
lateral effusion or pleural thickening.  Heart size upper limits
normal.
IMPRESSION: 1.  Partial improvement in bilateral infiltrates or edema.
